# Patient Record
Sex: Female | Born: 1950 | Race: White | Hispanic: No | Smoking: Former smoker
Health system: Southern US, Community
[De-identification: ages and names within clinical notes are randomized; demographics above are authoritative.]

## PROBLEM LIST (undated history)

## (undated) DIAGNOSIS — Z8719 Personal history of other diseases of the digestive system: Secondary | ICD-10-CM

## (undated) DIAGNOSIS — J45909 Unspecified asthma, uncomplicated: Secondary | ICD-10-CM

## (undated) DIAGNOSIS — T8859XA Other complications of anesthesia, initial encounter: Secondary | ICD-10-CM

## (undated) DIAGNOSIS — I493 Ventricular premature depolarization: Secondary | ICD-10-CM

## (undated) DIAGNOSIS — Z9289 Personal history of other medical treatment: Secondary | ICD-10-CM

## (undated) DIAGNOSIS — H04129 Dry eye syndrome of unspecified lacrimal gland: Secondary | ICD-10-CM

## (undated) DIAGNOSIS — J302 Other seasonal allergic rhinitis: Secondary | ICD-10-CM

## (undated) DIAGNOSIS — R002 Palpitations: Secondary | ICD-10-CM

## (undated) DIAGNOSIS — E785 Hyperlipidemia, unspecified: Secondary | ICD-10-CM

## (undated) DIAGNOSIS — R519 Headache, unspecified: Secondary | ICD-10-CM

## (undated) DIAGNOSIS — R682 Dry mouth, unspecified: Secondary | ICD-10-CM

## (undated) DIAGNOSIS — N3010 Interstitial cystitis (chronic) without hematuria: Secondary | ICD-10-CM

## (undated) DIAGNOSIS — Z8614 Personal history of Methicillin resistant Staphylococcus aureus infection: Secondary | ICD-10-CM

## (undated) DIAGNOSIS — G4700 Insomnia, unspecified: Secondary | ICD-10-CM

## (undated) DIAGNOSIS — T4145XA Adverse effect of unspecified anesthetic, initial encounter: Secondary | ICD-10-CM

## (undated) DIAGNOSIS — K589 Irritable bowel syndrome without diarrhea: Secondary | ICD-10-CM

## (undated) DIAGNOSIS — M1990 Unspecified osteoarthritis, unspecified site: Secondary | ICD-10-CM

## (undated) DIAGNOSIS — R42 Dizziness and giddiness: Secondary | ICD-10-CM

## (undated) DIAGNOSIS — Z8619 Personal history of other infectious and parasitic diseases: Secondary | ICD-10-CM

## (undated) DIAGNOSIS — F329 Major depressive disorder, single episode, unspecified: Secondary | ICD-10-CM

## (undated) DIAGNOSIS — L439 Lichen planus, unspecified: Secondary | ICD-10-CM

## (undated) DIAGNOSIS — K219 Gastro-esophageal reflux disease without esophagitis: Secondary | ICD-10-CM

## (undated) DIAGNOSIS — Z8673 Personal history of transient ischemic attack (TIA), and cerebral infarction without residual deficits: Secondary | ICD-10-CM

## (undated) DIAGNOSIS — H269 Unspecified cataract: Secondary | ICD-10-CM

## (undated) DIAGNOSIS — I10 Essential (primary) hypertension: Secondary | ICD-10-CM

## (undated) DIAGNOSIS — D649 Anemia, unspecified: Secondary | ICD-10-CM

## (undated) DIAGNOSIS — Z86718 Personal history of other venous thrombosis and embolism: Secondary | ICD-10-CM

## (undated) DIAGNOSIS — N35919 Unspecified urethral stricture, male, unspecified site: Secondary | ICD-10-CM

## (undated) DIAGNOSIS — F32A Depression, unspecified: Secondary | ICD-10-CM

## (undated) DIAGNOSIS — G459 Transient cerebral ischemic attack, unspecified: Secondary | ICD-10-CM

## (undated) DIAGNOSIS — F40240 Claustrophobia: Secondary | ICD-10-CM

## (undated) DIAGNOSIS — R51 Headache: Secondary | ICD-10-CM

## (undated) DIAGNOSIS — J40 Bronchitis, not specified as acute or chronic: Secondary | ICD-10-CM

## (undated) HISTORY — DX: Essential (primary) hypertension: I10

## (undated) HISTORY — PX: ELBOW SURGERY: SHX618

## (undated) HISTORY — PX: TOOTH EXTRACTION: SUR596

## (undated) HISTORY — DX: Palpitations: R00.2

## (undated) HISTORY — PX: HERNIA REPAIR: SHX51

## (undated) HISTORY — DX: Personal history of other medical treatment: Z92.89

## (undated) HISTORY — DX: Gastro-esophageal reflux disease without esophagitis: K21.9

## (undated) HISTORY — PX: WISDOM TOOTH EXTRACTION: SHX21

## (undated) HISTORY — DX: Hyperlipidemia, unspecified: E78.5

---

## 2001-10-11 ENCOUNTER — Other Ambulatory Visit: Admission: RE | Admit: 2001-10-11 | Discharge: 2001-10-11 | Payer: Self-pay | Admitting: Obstetrics and Gynecology

## 2001-10-18 ENCOUNTER — Encounter: Payer: Self-pay | Admitting: Obstetrics and Gynecology

## 2001-10-18 ENCOUNTER — Ambulatory Visit (HOSPITAL_COMMUNITY): Admission: RE | Admit: 2001-10-18 | Discharge: 2001-10-18 | Payer: Self-pay | Admitting: Obstetrics and Gynecology

## 2001-10-27 ENCOUNTER — Other Ambulatory Visit: Admission: RE | Admit: 2001-10-27 | Discharge: 2001-10-27 | Payer: Self-pay | Admitting: Obstetrics and Gynecology

## 2001-11-10 ENCOUNTER — Encounter: Admission: RE | Admit: 2001-11-10 | Discharge: 2001-11-10 | Payer: Self-pay | Admitting: Obstetrics and Gynecology

## 2001-11-10 ENCOUNTER — Encounter: Payer: Self-pay | Admitting: Obstetrics and Gynecology

## 2002-09-27 ENCOUNTER — Ambulatory Visit (HOSPITAL_BASED_OUTPATIENT_CLINIC_OR_DEPARTMENT_OTHER): Admission: RE | Admit: 2002-09-27 | Discharge: 2002-09-27 | Payer: Self-pay | Admitting: Obstetrics and Gynecology

## 2002-09-27 HISTORY — PX: OTHER SURGICAL HISTORY: SHX169

## 2002-10-10 ENCOUNTER — Emergency Department (HOSPITAL_COMMUNITY): Admission: EM | Admit: 2002-10-10 | Discharge: 2002-10-10 | Payer: Self-pay | Admitting: Emergency Medicine

## 2002-10-10 ENCOUNTER — Encounter: Payer: Self-pay | Admitting: Emergency Medicine

## 2002-10-25 ENCOUNTER — Other Ambulatory Visit: Admission: RE | Admit: 2002-10-25 | Discharge: 2002-10-25 | Payer: Self-pay | Admitting: Obstetrics and Gynecology

## 2004-09-19 ENCOUNTER — Encounter: Admission: RE | Admit: 2004-09-19 | Discharge: 2004-09-19 | Payer: Self-pay | Admitting: Obstetrics and Gynecology

## 2005-08-28 ENCOUNTER — Emergency Department (HOSPITAL_COMMUNITY): Admission: EM | Admit: 2005-08-28 | Discharge: 2005-08-29 | Payer: Self-pay | Admitting: Emergency Medicine

## 2007-06-23 ENCOUNTER — Emergency Department (HOSPITAL_COMMUNITY): Admission: EM | Admit: 2007-06-23 | Discharge: 2007-06-24 | Payer: Self-pay | Admitting: Emergency Medicine

## 2007-11-17 ENCOUNTER — Emergency Department (HOSPITAL_BASED_OUTPATIENT_CLINIC_OR_DEPARTMENT_OTHER): Admission: EM | Admit: 2007-11-17 | Discharge: 2007-11-17 | Payer: Self-pay | Admitting: Emergency Medicine

## 2008-02-04 DIAGNOSIS — Z9289 Personal history of other medical treatment: Secondary | ICD-10-CM

## 2008-02-04 HISTORY — DX: Personal history of other medical treatment: Z92.89

## 2008-02-09 ENCOUNTER — Ambulatory Visit: Payer: Self-pay | Admitting: Radiology

## 2008-02-09 ENCOUNTER — Emergency Department (HOSPITAL_BASED_OUTPATIENT_CLINIC_OR_DEPARTMENT_OTHER): Admission: EM | Admit: 2008-02-09 | Discharge: 2008-02-09 | Payer: Self-pay | Admitting: Emergency Medicine

## 2008-06-02 ENCOUNTER — Ambulatory Visit: Payer: Self-pay | Admitting: Diagnostic Radiology

## 2008-06-02 ENCOUNTER — Observation Stay (HOSPITAL_COMMUNITY): Admission: AD | Admit: 2008-06-02 | Discharge: 2008-06-03 | Payer: Self-pay | Admitting: Internal Medicine

## 2008-06-02 ENCOUNTER — Encounter: Payer: Self-pay | Admitting: Emergency Medicine

## 2009-04-19 ENCOUNTER — Emergency Department (HOSPITAL_BASED_OUTPATIENT_CLINIC_OR_DEPARTMENT_OTHER): Admission: EM | Admit: 2009-04-19 | Discharge: 2009-04-20 | Payer: Self-pay | Admitting: Emergency Medicine

## 2010-01-01 ENCOUNTER — Ambulatory Visit: Payer: Self-pay | Admitting: Cardiology

## 2010-05-14 LAB — CARDIAC PANEL(CRET KIN+CKTOT+MB+TROPI)
CK, MB: 1 ng/mL (ref 0.3–4.0)
Relative Index: INVALID (ref 0.0–2.5)
Total CK: 41 U/L (ref 7–177)
Troponin I: 0.01 ng/mL (ref 0.00–0.06)

## 2010-05-14 LAB — LIPID PANEL
Cholesterol: 198 mg/dL (ref 0–200)
HDL: 50 mg/dL (ref >39)
LDL Cholesterol: 126 mg/dL — ABNORMAL HIGH (ref 0–99)
Total CHOL/HDL Ratio: 4 RATIO
Triglycerides: 112 mg/dL (ref <150)
VLDL: 22 mg/dL (ref 0–40)

## 2010-05-15 LAB — CARDIAC PANEL(CRET KIN+CKTOT+MB+TROPI)
CK, MB: 0.9 ng/mL (ref 0.3–4.0)
CK, MB: 1 ng/mL (ref 0.3–4.0)
Relative Index: INVALID (ref 0.0–2.5)
Relative Index: INVALID (ref 0.0–2.5)
Total CK: 42 U/L (ref 7–177)
Total CK: 55 U/L (ref 7–177)
Troponin I: 0.01 ng/mL (ref 0.00–0.06)
Troponin I: 0.02 ng/mL (ref 0.00–0.06)

## 2010-05-15 LAB — TSH: TSH: 0.428 u[IU]/mL (ref 0.350–4.500)

## 2010-05-15 LAB — BASIC METABOLIC PANEL
BUN: 23 mg/dL (ref 6–23)
CO2: 29 mEq/L (ref 19–32)
Calcium: 9.6 mg/dL (ref 8.4–10.5)
Chloride: 103 mEq/L (ref 96–112)
Creatinine, Ser: 0.8 mg/dL (ref 0.4–1.2)
GFR calc Af Amer: >60 mL/min (ref >60)
GFR calc non Af Amer: >60 mL/min (ref >60)
Glucose, Bld: 114 mg/dL — ABNORMAL HIGH (ref 70–99)
Potassium: 5 mEq/L (ref 3.5–5.1)
Sodium: 140 mEq/L (ref 135–145)

## 2010-05-15 LAB — CBC
HCT: 42.6 % (ref 36.0–46.0)
Hemoglobin: 14.4 g/dL (ref 12.0–15.0)
MCHC: 33.8 g/dL (ref 30.0–36.0)
MCV: 92.4 fL (ref 78.0–100.0)
Platelets: 209 10*3/uL (ref 150–400)
RBC: 4.61 MIL/uL (ref 3.87–5.11)
RDW: 11.7 % (ref 11.5–15.5)
WBC: 11.9 10*3/uL — ABNORMAL HIGH (ref 4.0–10.5)

## 2010-05-15 LAB — DIFFERENTIAL
Basophils Absolute: 0.1 10*3/uL (ref 0.0–0.1)
Basophils Relative: 1 % (ref 0–1)
Eosinophils Absolute: 0.5 10*3/uL (ref 0.0–0.7)
Eosinophils Relative: 4 % (ref 0–5)
Lymphocytes Relative: 7 % — ABNORMAL LOW (ref 12–46)
Lymphs Abs: 0.8 10*3/uL (ref 0.7–4.0)
Monocytes Absolute: 1 10*3/uL (ref 0.1–1.0)
Monocytes Relative: 9 % (ref 3–12)
Neutro Abs: 9.5 10*3/uL — ABNORMAL HIGH (ref 1.7–7.7)
Neutrophils Relative %: 79 % — ABNORMAL HIGH (ref 43–77)

## 2010-05-15 LAB — POCT CARDIAC MARKERS
CKMB, poc: <1 ng/mL — ABNORMAL LOW (ref 1.0–8.0)
Myoglobin, poc: 77.2 ng/mL (ref 12–200)
Troponin i, poc: <0.05 ng/mL (ref 0.00–0.09)

## 2010-05-15 LAB — LIPID PANEL
Cholesterol: 216 mg/dL — ABNORMAL HIGH (ref 0–200)
HDL: 59 mg/dL (ref >39)
LDL Cholesterol: 135 mg/dL — ABNORMAL HIGH (ref 0–99)
Total CHOL/HDL Ratio: 3.7 RATIO
Triglycerides: 110 mg/dL (ref <150)
VLDL: 22 mg/dL (ref 0–40)

## 2010-05-20 LAB — D-DIMER, QUANTITATIVE: D-Dimer, Quant: 0.24 ug/mL-FEU (ref 0.00–0.48)

## 2010-05-20 LAB — POCT CARDIAC MARKERS
CKMB, poc: <1 ng/mL — ABNORMAL LOW (ref 1.0–8.0)
Myoglobin, poc: 30.3 ng/mL (ref 12–200)
Troponin i, poc: <0.05 ng/mL (ref 0.00–0.09)

## 2010-06-18 NOTE — Discharge Summary (Signed)
NAMESHAM, ALVIAR           ACCOUNT NO.:  [id number]   MEDICAL RECORD NO.:  [id number]          PATIENT TYPE:  INP   LOCATION:  4712                         FACILITY:  [facility]   PHYSICIAN:  Michelene Gardener, MD    DATE OF BIRTH:  Feb 06, 1950   DATE OF ADMISSION:  06/02/2008  DATE OF DISCHARGE:  06/03/2008                               DISCHARGE SUMMARY   DISCHARGE DIAGNOSES:  1. Noncardiac chest pain.  2. Hypertension.  3. Hyperlipidemia which is diet controlled.  4. Depression.   DISCHARGE MEDICATIONS:  1. Accupril 40 mg p.o. once daily.  2. Toprol-XL 50 mg once a day.  3. Hydrochlorothiazide 25 mg once a day.  4. Wellbutrin XL 300 mg p.o. once daily.   CONSULTATIONS:  None.   PROCEDURES:  None.   DIAGNOSTIC STUDIES:  1. Chest x-ray on June 02, 2008, showed no active disease.  2. CT angiography on June 02, 2008, showed no acute abnormalities.   FOLLOWUP:  With primary doctor within a week.   HOSPITAL COURSE:  This is a 60 year old female with past medical history  of hypertension and diet-controlled hyperlipidemia, presented to the  hospital complaining of chest pain.  Her pain was very atypical and it  has been consistent with her known arthritis.  The patient used to  experience same kind of pain with her known arthritis.  Came in at this  time because her pain lasted longer.  She only had 1 episode with no  recurrence during this hospitalization.  She was monitored on telemetry  which showed no evidence of arrhythmias.  Her EKG showed no evidence of  acute ischemia.  Three sets of troponin and cardiac enzymes were done  and they came to be normal.  Her chest x-ray and her CT angiography are  negative.  I have a prolonged discussion with herself and her family at  the time of discharge and I advised them to stay in the hospital for  echocardiogram versus stress test.  The patient and family preferred to  go home since this is the weekend and she has to stay at  least for 2  days before this test has been done.  I myself I feel that it is  reasonable since she is asymptomatic for the time being and her total  workup has been negative.  I advised her to follow up with her primary  doctor next week for possible echocardiogram versus  stress test and to come to the ER if she develops any chest pain or  increasing shortness of breath.  She will be discharged home in  preadmission medications.   Total assessment time is 40 minutes.      Michelene Gardener, MD  Electronically Signed     NAE/MEDQ  D:  06/03/2008  T:  06/04/2008  Job:  [id number]

## 2010-06-18 NOTE — H&P (Signed)
NAMEGEENA, WEINHOLD           ACCOUNT NO.:  [id number]   MEDICAL RECORD NO.:  [id number]          PATIENT TYPE:  INP   LOCATION:  4712                         FACILITY:  [facility]   PHYSICIAN:  Charlestine Massed, MDDATE OF BIRTH:  02/07/1950   DATE OF ADMISSION:  06/02/2008  DATE OF DISCHARGE:                              HISTORY & PHYSICAL   PRIMARY CARE PHYSICIAN:  Dr. Benedetto Goad and Dr. Doristine Counter at [facility].   CHIEF COMPLAINT:  The patient was transferred from [facility]  to [facility] to rule out acute myocardial infarction as per  the physician at [facility].   HISTORY OF PRESENT ILLNESS:  Ms. Khamia Stambaugh is a 60 year old  female who works as a Runner, broadcasting/film/video at [employer].  Was  transferred from [facility] for a complaint of chest pain.  The  pain started in the morning today.  She had the pain yesterday evening  also all the time.  It was in the retrosternal area with no specific  pointing area.  It is graded from 5-6/10.  The pain is more of a vague  pain as per the patient.  It is not a pressure, not a squeezing type.  It is not a tightness.  It is not burning and is not a sharp pain.  She  describes it as a vague pain.  Se has had this pain before and she was  told that it is more musculoskeletal.  She has chronic neck pain as well  as bilateral shoulder pain, for which she has been getting treatment for  degenerative joint disease as well as possible radiculopathy which is  present for years.  She said this pain started in the evening and when  she went to bed the pain was still there and she slept.  In the morning  she woke up with her regular alarm.  The pain did not wake her up from  sleep.  When she woke up the pain was still there and it continued and  it was not going away as usually it does, and so she went to [facility] even though she lives in [place].  She was evaluated by the  physician there.  A CAT scan of the chest was done to rule out pulmonary  embolism, which is negative.  Initial troponins and EKGs did not reveal  any evidence of acute ischemia going on.  The patient was transferred  from [facility] to here for further telemetry and to rule out  MI.   The patient said she had similar pain a few years back but nothing in  the recent past.  She had a workup with echocardiogram a long time back.  She was also told by her primary doctor that she has acid reflux  disease.   The pain is present in the retrosternal area.  It is a vague kind of  pain as explained earlier.  There is no radiation of the pain.  Her neck  and shoulder pains are chronic, which were there even before this pain  started.  There is no radiation of the pain to the back.  There in an  increase in the pain with deep inspiration with induced coughing, but  she does not have a regular cough and there is no relieving factor for  the pain.  When she came to the ER, they give her morphine and the pain  was relieved.  She denies any fever or chills.  A few days ago she had  some nasal stuffiness and congestion but there was no cough at any time  and that she does not have a cough with sputum production now.  No  nausea or vomiting or diarrhea.  No headache.  She has very good  exercise tolerance.  She can walk a considerable distance and she can go  up the stairs at home without any shortness of breath.  There has been  no limitation in her physical activity at any time due to any symptoms  such as shortness of breath or chest pain or dizziness.  She never had  any loss of consciousness at any time.  No palpitations.  No fevers.  No  urinary symptoms.  No abdominal pain.   Her past history is significant for:  1. Hypertension.  2. Acid reflux.  3. Chronic neck and shoulder pain due to her neck issues as per the      patient.   CURRENT MEDICATIONS:  1. Accupril 40 mg p.o.  daily.  2. Toprol XL 50 mg p.o. daily.  3. Hydrochlorothiazide 25 mg p.o. daily.  4. Wellbutrin XL 300 mg p.o. daily.   ALLERGIES:  NO KNOWN DRUG ALLERGIES.  NO LATEX ALLERGY.   SOCIAL HISTORY:  Never smoked.  Very occasional social alcohol intake.  Denies any drug use.  No chronic pain medication use.   FAMILY HISTORY:  Mom had hypertension.  No history of diabetes or acute  myocardial infarction or heart disease or stroke in any family members  including her siblings, her mom, dad, or in the next related family  members.   REVIEW OF SYSTEMS:  CONSTITUTIONAL:  No fever, no weakness, no fatigue  and no other issues.  HEAD AND NECK:  Neck pain, shoulder pain, chronic.  RESPIRATORY AND CARDIAC:  Chest pain, retrosternal, increasing with  inspiration.  No cough, no fever, no sputum production.  GI AND GU:  History of acid reflux disease.  No abdominal pain.  No nausea,  vomiting, diarrhea.  No urinary symptoms.  She is postmenopausal.  MUSCULOSKELETAL:  Pain in the chest could be possibly musculoskeletal.  No other body pain.  CNS:  No neurological issues and possible  radiculopathy in the neck.  PSYCH:  Stable mental status.   A 12-point review of all systems was done.  Positive pertinent features  as mentioned in the history of present illness and as above and is  negative otherwise.   VITAL SIGNS:  Vital signs done at the office, blood pressure 119/74,  heart rate 76, respiration 18, temperature 98.1 and O2 sat 99% on room  air.  GENERAL EXAMINATION:  The patient is awake and alert, not in any  distress.  Afebrile.  HEAD AND NECK EXAM:  Pupils reactive to light.  No JVD.  No bleeding  seen of the oral or nasal mucosa.  NECK:  No JVD.  No bruit.  No nodes.  No tender spots in the neck.  Turning of the neck does not elicit any pain.  CHEST:  The retrosternal area, no eliciting, the chest  pain could not be  reproduced by gentle pressure on the chest.  There are no tender areas,   no inflammation seen.  Bilateral air entry is good anteriorly and  posteriorly.  No pleural rubs or rales or wheeze heard.  CARDIAC:  S1, S2 heard, regular.  There is a systolic murmur in the  aortic area.  No rubs heard.  ABDOMEN:  Soft, nontender.  No organomegaly.  Bowel sounds positive with  no costovertebral angle tenderness.  EXTREMITIES:  Trace bilateral pedal edema present.  CNS: No obvious motor or sensory deficits.  Comprehension and speech are  intact.  Alert and oriented x3.  PSYCH:  Stable mental status.  MUSCULOSKELETAL:  Normal range of motion in all joints.  No effusions  and no other issues present.   LABS:  EKG done at [facility] at 6:10 a.m. shows a normal sinus rhythm  at 78 beats per minute.  There is a leftward axis, no acute ST or T-wave  changes or ischemia seen.  Questionable left atrial enlargement but  still not typical.   A chest x-ray done at [facility] shows no active cardiopulmonary  disease.  Cardiac silhouette is within normal limits.   CT angiography for PE protocol done at [facility] also is negative for  pulmonary embolism.  No aortic dissection or aneurysm.  Lungs are clear  without infiltrate or effusion.  No mass.  There is an 11-cm precarinal  lymph node with a fatty hilum which appears benign.  Hepatic cyst  present.  No acute abnormalities.  On summary, CT was negative for PE.  BMP:  Sodium 140, potassium 5.0, chloride 103, bicarb 29, glucose 114,  BUN 23, creatinine 0.8 and calcium 9.6.   CBC:  WBC 7.9, hemoglobin 14.4, hematocrit 42.6, platelet count 209,  neutrophils 79%.   Cardiac markers:  Troponin less than 0.05 x1 set done at 6:42 a.m.   ASSESSMENT:  1. Chest pain mostly noncardiac origin, to rule out noncardiac      origin/not secondary to ischemic heart disease but to rule out      pericarditis as a possibility versus bronchitis versus      musculoskeletal pain.  2. Hypertension.  Blood pressure is controlled.   PLAN:  First,  cardiac wise, will start the patient on aspirin as the  chest pain is more noncardiac in origin.  Still, we will do cyclic  enzymes and will check a fasting lipid panel in view of the fact that  she her risk for heart disease is high at this age.  Will check a 2-D  echocardiogram to rule out a pericardial effusion/pericarditis.  Put the  patient on naproxen 250 mg p.o. b.i.d. for her pain.  Will give Protonix  at 40 mg p.o. b.i.d. for acid reflux disease at this time.  Will  continue aspirin.  Will follow the results of the echo for pericarditis.  Currently there is no evidence of any effusion or cardiac tamponade at  this moment.  Can continue a 2-gram sodium heart-healthy diet and  activity is a tolerated.  Vital signs as per floor protocol.  Will also  check TSH in the a.m.  Will continue the Toprol XL and Accupril and  hydrochlorothiazide as before.   Prior history of Wellbutrin intake, which is not clear for the exact  reason why she was prescribed Wellbutrin by the PMD.  Will continue the  Wellbutrin for now as there is no contraindication at this time.  DVT prophylaxis.  Will give heparin.   DISPOSITION:  Once a 2-D echo rules out any presence of effusion and if  enzymes are negative, the patient can be discharged with further  followup as an outpatient with her PMD and with GI referral from by the  PMD for further evaluation of acid reflux disease.   A total of 45 minutes was spent on this admission.      Charlestine Massed, MD  Electronically Signed     UT/MEDQ  D:  06/02/2008  T:  06/02/2008  Job:  [id number]   cc:   Gloriajean Dell. Andrey Campanile, M.D.

## 2010-06-21 NOTE — Op Note (Signed)
   NAME:  Cynthia Peters, Cynthia Peters                     ACCOUNT NO.:  [id number]   MEDICAL RECORD NO.:  [id number]                   PATIENT TYPE:  AMB   LOCATION:  [facility]                                 FACILITY:  [facility]   PHYSICIAN:  Cynthia P. Romine, M.D.             DATE OF BIRTH:  January 14, 1951   DATE OF PROCEDURE:  09/27/2002  DATE OF DISCHARGE:                                 OPERATIVE REPORT   PREOPERATIVE DIAGNOSIS:  Menorrhagia unresponsive to medical management.   POSTOPERATIVE DIAGNOSIS:  Menorrhagia unresponsive to medical management.   PROCEDURE:  Endometrial ablation using HydroThermAblator.   SURGEON:  Cynthia P. Romine, M.D.   ANESTHESIA:  General by LMA.   ESTIMATED BLOOD LOSS:  Minimal.   COMPLICATIONS:  None.   DESCRIPTION OF PROCEDURE:  The patient was taken to the operating room and  after the induction of adequate general anesthesia, was placed in the dorsal  lithotomy position and prepped and draped in the usual fashion.  The bladder  was drained with a red rubber catheter.  The cervix was grasped at the  anterior lip with a single-toothed tenaculum; the uterus sounded to 8 cm.  The cervix was dilated to #21 Shawnie Pons.  The ablating hysteroscope was then  introduced.  Photographic documentation was taken of the tubal ostia to  document proper placement.  Endometrial ablation was then carried out in the  standard fashion.  There were no complications.  Photographic documentation  was taken of the postoperative effect.  The procedure was terminated.  The  instruments were removed from the vagina and the patient was taken to the  recovery room in satisfactory condition.                                               Cynthia P. Romine, M.D.    CPR/MEDQ  D:  09/27/2002  T:  09/27/2002  Job:  [id number]

## 2010-08-22 ENCOUNTER — Encounter: Payer: Self-pay | Admitting: Cardiology

## 2010-08-23 ENCOUNTER — Ambulatory Visit (INDEPENDENT_AMBULATORY_CARE_PROVIDER_SITE_OTHER): Payer: BC Managed Care – PPO | Admitting: Cardiology

## 2010-08-23 ENCOUNTER — Encounter: Payer: Self-pay | Admitting: Cardiology

## 2010-08-23 DIAGNOSIS — I493 Ventricular premature depolarization: Secondary | ICD-10-CM

## 2010-08-23 DIAGNOSIS — E785 Hyperlipidemia, unspecified: Secondary | ICD-10-CM

## 2010-08-23 DIAGNOSIS — I4949 Other premature depolarization: Secondary | ICD-10-CM

## 2010-08-23 DIAGNOSIS — R002 Palpitations: Secondary | ICD-10-CM

## 2010-08-23 DIAGNOSIS — I10 Essential (primary) hypertension: Secondary | ICD-10-CM

## 2010-08-23 NOTE — Assessment & Plan Note (Signed)
Well-controlled on metoprolol and Accupril.

## 2010-08-23 NOTE — Patient Instructions (Signed)
You may take one half of metoprolol if needed for palpitations.  I encourage regular aerobic exercise.

## 2010-08-23 NOTE — Assessment & Plan Note (Signed)
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 These are benign based on her prior cardiac evaluation. She has no structural heart disease. She is on appropriate blocker therapy. I recommended that she may take an extra half a dose of metoprolol if needed for increased symptoms but I would continue on her current therapy. I reassured her that this is benign. I will see her back as needed.

## 2010-08-23 NOTE — Progress Notes (Signed)
   Cynthia Peters Date of Birth: [birth year]   History of Present Illness: Cynthia Peters is seen for evaluation of palpitations. She reports that 2 days ago she was trying to go to sleep when she felt palpitations. She usually feels or hears this in her right ear. She notices a skip and a pound. When she awoke the next morning she was having similar symptoms. She had recurrent symptoms the next day as well. She has had no dizziness or syncope. She denies any shortness of breath or chest pain. She has had previous evaluation here including an event monitor in 2007. This showed rare PVCs. She also had a stress echo in 2010 which was normal. She has been on chronic beta blocker therapy. She denies caffeine or decongestant use. She does not drink alcohol.  Current Outpatient Prescriptions on File Prior to Visit  Medication Sig Dispense Refill  . albuterol (VENTOLIN HFA) 108 (90 BASE) MCG/ACT inhaler Inhale 2 puffs into the lungs every 6 (six) hours as needed.        Marland Kitchen buPROPion (WELLBUTRIN XL) 300 MG 24 hr tablet Take 300 mg by mouth daily.        . hydrochlorothiazide 25 MG tablet Take 25 mg by mouth daily.        . metoprolol (TOPROL-XL) 50 MG 24 hr tablet Take 50 mg by mouth daily.        Marland Kitchen omeprazole (PRILOSEC OTC) 20 MG tablet Take 20 mg by mouth daily.        . Probiotic Product (ALIGN PO) Take by mouth daily.        . quinapril (ACCUPRIL) 40 MG tablet Take 40 mg by mouth at bedtime.        . simvastatin (ZOCOR) 20 MG tablet Take 20 mg by mouth at bedtime.          No Known Allergies  Past Medical History  Diagnosis Date  . Irregular heart beat   . Palpitations   . GERD (gastroesophageal reflux disease)   . Hyperlipidemia   . Hypertension   . TMJ arthralgia   . Bronchitis, acute   . Cystitis   . Joint pain   . PVC's (premature ventricular contractions)     Past Surgical History  Procedure Date  . Dilation and curettage of uterus   . Endometrial ablation     History    Smoking status  . Never Smoker   Smokeless tobacco  . Not on file    History  Alcohol Use No    Family History  Problem Relation Age of Onset  . Dementia Mother   . Leukemia Child     Review of Systems: The review of systems is positive for symptoms of irritable bowel syndrome.  All other systems were reviewed and are negative.  Physical Exam: BP 120/80  Pulse 53  Ht 5\' 4"  (1.626 m)  Wt 158 lb 12.8 oz (72.031 kg)  BMI 27.26 kg/m2 She is a pleasant white female in no acute distress. Her HEENT exam is unremarkable. Has no JVD, adenopathy, thyromegaly, or bruits. Lungs are clear. Cardiac exam reveals a regular rate and rhythm without gallop, murmur, or click. Abdomen soft nontender. She has no edema. Pedal pulses are good. She is alert and oriented x3. Cranial nerves II through XII are intact. LABORATORY DATA: ECG demonstrates sinus bradycardia with a rate of 53 beats per minute with left axis deviation. It is otherwise normal.  Assessment / Plan:

## 2010-08-26 ENCOUNTER — Encounter: Payer: Self-pay | Admitting: Cardiology

## 2010-10-18 ENCOUNTER — Ambulatory Visit: Payer: Self-pay | Admitting: Cardiology

## 2010-10-30 LAB — DIFFERENTIAL
Basophils Absolute: 0.1
Basophils Relative: 1
Eosinophils Absolute: 0.5
Eosinophils Relative: 6 — ABNORMAL HIGH
Lymphocytes Relative: 18
Lymphs Abs: 1.6
Monocytes Absolute: 0.8
Monocytes Relative: 9
Neutro Abs: 5.7
Neutrophils Relative %: 66

## 2010-10-30 LAB — LIPASE, BLOOD: Lipase: 24

## 2010-10-30 LAB — URINE MICROSCOPIC-ADD ON

## 2010-10-30 LAB — URINALYSIS, ROUTINE W REFLEX MICROSCOPIC
Bilirubin Urine: NEGATIVE
Glucose, UA: NEGATIVE
Hgb urine dipstick: NEGATIVE
Ketones, ur: NEGATIVE
Nitrite: NEGATIVE
Protein, ur: NEGATIVE
Specific Gravity, Urine: 1.008
Urobilinogen, UA: 0.2
pH: 6.5

## 2010-10-30 LAB — CBC
HCT: 39.4
Hemoglobin: 13.6
MCHC: 34.4
MCV: 92.6
Platelets: 223
RBC: 4.26
RDW: 12.6
WBC: 8.6

## 2010-10-30 LAB — COMPREHENSIVE METABOLIC PANEL
ALT: 31
AST: 25
Albumin: 4.2
Alkaline Phosphatase: 59
BUN: 16
CO2: 28
Calcium: 9.4
Chloride: 104
Creatinine, Ser: 0.88
GFR calc Af Amer: >60
GFR calc non Af Amer: >60
Glucose, Bld: 106 — ABNORMAL HIGH
Potassium: 3.4 — ABNORMAL LOW
Sodium: 141
Total Bilirubin: 1.4 — ABNORMAL HIGH
Total Protein: 6.2

## 2011-01-03 ENCOUNTER — Other Ambulatory Visit (HOSPITAL_COMMUNITY): Payer: Self-pay | Admitting: Gastroenterology

## 2011-01-03 ENCOUNTER — Ambulatory Visit (HOSPITAL_COMMUNITY)
Admission: RE | Admit: 2011-01-03 | Discharge: 2011-01-03 | Disposition: A | Discharge status: Home / Self Care | Payer: BC Managed Care – PPO | Source: Ambulatory Visit | Attending: Gastroenterology | Admitting: Gastroenterology

## 2011-01-03 DIAGNOSIS — R109 Unspecified abdominal pain: Secondary | ICD-10-CM

## 2011-01-03 DIAGNOSIS — K429 Umbilical hernia without obstruction or gangrene: Secondary | ICD-10-CM | POA: Insufficient documentation

## 2011-01-03 DIAGNOSIS — K7689 Other specified diseases of liver: Secondary | ICD-10-CM | POA: Insufficient documentation

## 2011-01-03 MED ORDER — IOHEXOL 300 MG/ML  SOLN
100.0000 mL | Freq: Once | INTRAMUSCULAR | Status: AC | PRN
  Administered 2011-01-03: 100 mL via INTRAVENOUS

## 2011-01-29 ENCOUNTER — Encounter (INDEPENDENT_AMBULATORY_CARE_PROVIDER_SITE_OTHER): Payer: Self-pay | Admitting: Surgery

## 2011-01-31 ENCOUNTER — Ambulatory Visit (INDEPENDENT_AMBULATORY_CARE_PROVIDER_SITE_OTHER): Payer: BC Managed Care – PPO | Admitting: Surgery

## 2011-02-04 DIAGNOSIS — Z9289 Personal history of other medical treatment: Secondary | ICD-10-CM

## 2011-02-04 DIAGNOSIS — Z8673 Personal history of transient ischemic attack (TIA), and cerebral infarction without residual deficits: Secondary | ICD-10-CM

## 2011-02-04 HISTORY — DX: Personal history of other medical treatment: Z92.89

## 2011-02-04 HISTORY — DX: Personal history of transient ischemic attack (TIA), and cerebral infarction without residual deficits: Z86.73

## 2011-02-04 HISTORY — PX: UMBILICAL HERNIA REPAIR: SHX196

## 2011-02-09 ENCOUNTER — Encounter (HOSPITAL_BASED_OUTPATIENT_CLINIC_OR_DEPARTMENT_OTHER): Payer: Self-pay | Admitting: *Deleted

## 2011-02-09 ENCOUNTER — Emergency Department (HOSPITAL_BASED_OUTPATIENT_CLINIC_OR_DEPARTMENT_OTHER)
Admission: EM | Admit: 2011-02-09 | Discharge: 2011-02-09 | Disposition: A | Discharge status: Home / Self Care | Payer: BC Managed Care – PPO | Attending: Emergency Medicine | Admitting: Emergency Medicine

## 2011-02-09 DIAGNOSIS — R21 Rash and other nonspecific skin eruption: Secondary | ICD-10-CM | POA: Insufficient documentation

## 2011-02-09 DIAGNOSIS — Z79899 Other long term (current) drug therapy: Secondary | ICD-10-CM | POA: Insufficient documentation

## 2011-02-09 DIAGNOSIS — E785 Hyperlipidemia, unspecified: Secondary | ICD-10-CM | POA: Insufficient documentation

## 2011-02-09 DIAGNOSIS — K219 Gastro-esophageal reflux disease without esophagitis: Secondary | ICD-10-CM | POA: Insufficient documentation

## 2011-02-09 DIAGNOSIS — T7840XA Allergy, unspecified, initial encounter: Secondary | ICD-10-CM

## 2011-02-09 DIAGNOSIS — I10 Essential (primary) hypertension: Secondary | ICD-10-CM | POA: Insufficient documentation

## 2011-02-09 DIAGNOSIS — J40 Bronchitis, not specified as acute or chronic: Secondary | ICD-10-CM

## 2011-02-09 MED ORDER — FAMOTIDINE 20 MG PO TABS
40.0000 mg | ORAL_TABLET | Freq: Once | ORAL | Status: AC
  Administered 2011-02-09: 40 mg via ORAL
  Filled 2011-02-09: qty 2

## 2011-02-09 MED ORDER — PREDNISONE 50 MG PO TABS: 50.0000 mg | ORAL_TABLET | Freq: Every day | ORAL | Status: DC

## 2011-02-09 MED ORDER — FAMOTIDINE 20 MG PO TABS: 20.0000 mg | ORAL_TABLET | Freq: Two times a day (BID) | ORAL | Status: DC

## 2011-02-09 MED ORDER — DEXAMETHASONE SODIUM PHOSPHATE 4 MG/ML IJ SOLN
8.0000 mg | Freq: Once | INTRAMUSCULAR | Status: AC
  Administered 2011-02-09: 8 mg via INTRAMUSCULAR
  Filled 2011-02-09: qty 2

## 2011-02-09 NOTE — ED Notes (Signed)
Pt was seen here last night for a possible reaction to meds pt had redness to face and flushing pt returns tonight with a feeling of difficulty breathing VS stable no distress noted

## 2011-02-09 NOTE — ED Notes (Signed)
Pt had surgery day before yesterday. Seen here today for possible reaction to something she was given. Given shot, but later developed hoarseness and hard to take a deep breath. Hx bronchitis. No distress.

## 2011-02-09 NOTE — ED Provider Notes (Signed)
History     CSN: [id number]  Arrival date & time 02/09/11  1904   First MD Initiated Contact with Patient 02/09/11 2000      Chief Complaint  Patient presents with  . Medication Reaction    (Consider location/radiation/quality/duration/timing/severity/associated sxs/prior treatment) HPI Comments: Pt states that she had hernia surgery 2 days ago :pt state that today she feels like she is having some bronchitis:pt states that she was seen yesterday for an allergic reaction to something and she was wondering if this was related:pt state that her inhaler helps the symptoms:pt states that she also woke up hoarse this morning  Patient is a 61 y.o. female presenting with cough. The history is provided by the patient. No language interpreter was used.  Cough This is a new problem. The current episode started 12 to 24 hours ago. The problem occurs constantly. The problem has not changed since onset.The cough is non-productive. There has been no fever. Treatments tried: albuterol. The treatment provided moderate relief. Her past medical history is significant for bronchitis.    Past Medical History  Diagnosis Date  . Irregular heart beat   . Palpitations   . GERD (gastroesophageal reflux disease)   . Hyperlipidemia   . Hypertension   . TMJ arthralgia   . Bronchitis, acute   . Cystitis   . Joint pain   . PVC's (premature ventricular contractions)     Past Surgical History  Procedure Date  . Dilation and curettage of uterus   . Endometrial ablation   . Hernia repair     Family History  Problem Relation Age of Onset  . Dementia Mother   . Leukemia Child     History  Substance Use Topics  . Smoking status: Never Smoker   . Smokeless tobacco: Not on file  . Alcohol Use: No    OB History    Grav Para Term Preterm Abortions TAB SAB Ect Mult Living                  Review of Systems  Respiratory: Positive for cough.   All other systems reviewed and are  negative.    Allergies  Review of patient's allergies indicates no known allergies.  Home Medications   Current Outpatient Rx  Name Route Sig Dispense Refill  . ACETAMINOPHEN 500 MG PO TABS Oral Take 1,000 mg by mouth every 6 (six) hours as needed. For pain     . ALBUTEROL SULFATE HFA 108 (90 BASE) MCG/ACT IN AERS Inhalation Inhale 2 puffs into the lungs every 6 (six) hours as needed. For shortness of breath and wheezing    . BUPROPION HCL ER (XL) 300 MG PO TB24 Oral Take 300 mg by mouth daily.      Marland Kitchen CALCIUM CARBONATE-VITAMIN D 600-400 MG-UNIT PO TABS Oral Take 1 tablet by mouth daily.      Marland Kitchen DICYCLOMINE HCL 10 MG PO CAPS Oral Take 10 mg by mouth 4 (four) times daily -  before meals and at bedtime. For IBS     . ETODOLAC 500 MG PO TABS Oral Take 500 mg by mouth daily as needed. For pain    . EVENING PRIMROSE OIL 1000 MG PO CAPS Oral Take 2 capsules by mouth daily.      Marland Kitchen FLAXSEED (LINSEED) 1000 MG PO CAPS Oral Take 2 capsules by mouth daily.      Marland Kitchen HYDROCHLOROTHIAZIDE 25 MG PO TABS Oral Take 25 mg by mouth daily.      Marland Kitchen  LOSARTAN POTASSIUM 50 MG PO TABS Oral Take 50 mg by mouth daily.      Marland Kitchen METOPROLOL SUCCINATE ER 50 MG PO TB24 Oral Take 50 mg by mouth daily.      . ADULT MULTIVITAMIN W/MINERALS CH Oral Take 1 tablet by mouth daily.      Marland Kitchen OMEPRAZOLE MAGNESIUM 20 MG PO TBEC Oral Take 20 mg by mouth daily.      Marland Kitchen ALIGN PO Oral Take 1 capsule by mouth daily.     Marland Kitchen SIMVASTATIN 20 MG PO TABS Oral Take 20 mg by mouth daily.     Marland Kitchen FAMOTIDINE 20 MG PO TABS Oral Take 1 tablet (20 mg total) by mouth 2 (two) times daily. 10 tablet 0  . PREDNISONE 50 MG PO TABS Oral Take 1 tablet (50 mg total) by mouth daily. 5 tablet 0    BP 143/78  Pulse 69  Temp(Src) 98.1 F (36.7 C) (Oral)  Resp 17  Ht 5\' 4"  (1.626 m)  Wt 160 lb (72.576 kg)  BMI 27.46 kg/m2  SpO2 100%  Physical Exam  Nursing note and vitals reviewed. Constitutional: She is oriented to person, place, and time. She appears  well-developed and well-nourished.  HENT:  Head: Normocephalic and atraumatic.  Right Ear: External ear normal.  Left Ear: External ear normal.  Mouth/Throat: Oropharynx is clear and moist.  Cardiovascular: Normal rate and regular rhythm.   Pulmonary/Chest: Effort normal and breath sounds normal.  Musculoskeletal: Normal range of motion.  Neurological: She is alert and oriented to person, place, and time.  Skin: Skin is warm and dry.  Psychiatric: She has a normal mood and affect.    ED Course  Procedures (including critical care time)  Labs Reviewed - No data to display No results found.   1. Bronchitis       MDM  Possibly a bronchitis:pt has inhaler at home and was given steroids yesterday:pt in no acute distress at this time        Teressa Lower, NP 02/09/11 2029

## 2011-02-09 NOTE — ED Notes (Signed)
Pt states she had hernia surgery yesterday and this a.m. Eyes were watery and face felt hot and flushed. Taking Hydrocodone for pain. Called Dr. Catalina Pizza to take Benadryl 50 mg. Stop Hydrocodone (which she did) Face still feels flushed. No itching or SHOB.

## 2011-02-09 NOTE — ED Provider Notes (Signed)
History     CSN: [id number]  Arrival date & time 02/09/11  0051   First MD Initiated Contact with Patient 02/09/11 0142      Chief Complaint  Patient presents with  . Allergic Reaction    (Consider location/radiation/quality/duration/timing/severity/associated sxs/prior treatment) Patient is a 61 y.o. female presenting with allergic reaction. The history is provided by the patient. No language interpreter was used.  Allergic Reaction The primary symptoms are  rash. The primary symptoms do not include wheezing, shortness of breath, cough, abdominal pain, nausea, vomiting, diarrhea, dizziness, palpitations, altered mental status, angioedema or urticaria. The current episode started 3 to 5 hours ago. The problem has not changed since onset.This is a new problem.  The rash began yesterday. The rash appears on the face. The pain associated with the rash is moderate. The rash is associated with itching. The rash is not associated with blisters or weeping. Risk factors for rash include new medications.  The onset of the reaction was associated with a new medication. Significant symptoms also include eye redness and itching. Significant symptoms that are not present include rhinorrhea.  Had hernia surgery yesterday then developed eye redness and facial redness and itching Saturday evening told by surgery PA to stop vicodin which was a new medication for the patient.  Told to take benadryl but not working  Past Medical History  Diagnosis Date  . Irregular heart beat   . Palpitations   . GERD (gastroesophageal reflux disease)   . Hyperlipidemia   . Hypertension   . TMJ arthralgia   . Bronchitis, acute   . Cystitis   . Joint pain   . PVC's (premature ventricular contractions)     Past Surgical History  Procedure Date  . Dilation and curettage of uterus   . Endometrial ablation   . Hernia repair     Family History  Problem Relation Age of Onset  . Dementia Mother   . Leukemia Child       History  Substance Use Topics  . Smoking status: Never Smoker   . Smokeless tobacco: Not on file  . Alcohol Use: No    OB History    Grav Para Term Preterm Abortions TAB SAB Ect Mult Living                  Review of Systems  Constitutional: Negative for fever and activity change.  HENT: Negative for facial swelling and rhinorrhea.   Eyes: Positive for redness.  Respiratory: Negative for cough, shortness of breath and wheezing.   Cardiovascular: Negative for palpitations.  Gastrointestinal: Negative for nausea, vomiting, abdominal pain and diarrhea.  Genitourinary: Negative for difficulty urinating.  Musculoskeletal: Negative.   Skin: Positive for itching and rash.  Neurological: Negative for dizziness.  Hematological: Negative.   Psychiatric/Behavioral: Negative.  Negative for altered mental status.    Allergies  Review of patient's allergies indicates no known allergies.  Home Medications   Current Outpatient Rx  Name Route Sig Dispense Refill  . ALBUTEROL SULFATE HFA 108 (90 BASE) MCG/ACT IN AERS Inhalation Inhale 2 puffs into the lungs every 6 (six) hours as needed.      . BUPROPION HCL ER (XL) 300 MG PO TB24 Oral Take 300 mg by mouth daily.      . ETODOLAC 500 MG PO TABS      . FAMOTIDINE 20 MG PO TABS Oral Take 1 tablet (20 mg total) by mouth 2 (two) times daily. 10 tablet 0  . HYDROCHLOROTHIAZIDE 25  MG PO TABS Oral Take 25 mg by mouth daily.      Marland Kitchen METOPROLOL SUCCINATE ER 50 MG PO TB24 Oral Take 50 mg by mouth daily.      Marland Kitchen OMEPRAZOLE MAGNESIUM 20 MG PO TBEC Oral Take 20 mg by mouth daily.      Marland Kitchen PREDNISONE 50 MG PO TABS Oral Take 1 tablet (50 mg total) by mouth daily. 5 tablet 0  . ALIGN PO Oral Take by mouth daily.      . QUINAPRIL HCL 40 MG PO TABS Oral Take 40 mg by mouth at bedtime.      Marland Kitchen SIMVASTATIN 20 MG PO TABS Oral Take 20 mg by mouth at bedtime.      . TOBRAMYCIN-DEXAMETHASONE 0.3-0.1 % OP SUSP        BP 159/78  Pulse 78  Temp(Src) 98.6 F  (37 C) (Oral)  Resp 20  Ht 5\' 4"  (1.626 m)  Wt 160 lb (72.576 kg)  BMI 27.46 kg/m2  SpO2 99%  Physical Exam  Constitutional: She is oriented to person, place, and time. She appears well-developed and well-nourished.  HENT:  Mouth/Throat: Oropharynx is clear and moist. No oropharyngeal exudate.  Eyes: Pupils are equal, round, and reactive to light. Right eye exhibits no discharge. Left eye exhibits no discharge.       Injected conjunctiva B  Neck: Normal range of motion. Neck supple.  Cardiovascular: Normal rate and regular rhythm.   Pulmonary/Chest: Effort normal and breath sounds normal. No stridor. She has no wheezes.  Abdominal: Soft. Bowel sounds are normal. There is no rebound and no guarding.       Incision CDI  Musculoskeletal: Normal range of motion.  Lymphadenopathy:    She has no cervical adenopathy.  Neurological: She is alert and oriented to person, place, and time.  Skin: Skin is warm and dry. Rash noted.     Psychiatric: Thought content normal.    ED Course  Procedures (including critical care time)  Labs Reviewed - No data to display No results found.   1. Allergic reaction       MDM  Allergic reaction limited to the face.  VIcodin may allergen but given recent surgery tap to secure ET tube or adhesives used during anesthesia could also have caused the localized reaction.  Markedly improved post meds.  Stop vicodin call surgeon and clear prednisone use with him via phone.  Patient verbalize understanding and agrees to follow up        Annel Zunker K Laniyah Rosenwald-Rasch, MD 02/09/11 [phone]

## 2011-02-10 ENCOUNTER — Encounter (INDEPENDENT_AMBULATORY_CARE_PROVIDER_SITE_OTHER): Payer: Self-pay | Admitting: Surgery

## 2011-02-14 ENCOUNTER — Emergency Department (HOSPITAL_BASED_OUTPATIENT_CLINIC_OR_DEPARTMENT_OTHER)
Admission: EM | Admit: 2011-02-14 | Discharge: 2011-02-14 | Disposition: A | Discharge status: Home / Self Care | Payer: BC Managed Care – PPO | Attending: Emergency Medicine | Admitting: Emergency Medicine

## 2011-02-14 ENCOUNTER — Encounter (HOSPITAL_BASED_OUTPATIENT_CLINIC_OR_DEPARTMENT_OTHER): Payer: Self-pay | Admitting: *Deleted

## 2011-02-14 ENCOUNTER — Emergency Department (INDEPENDENT_AMBULATORY_CARE_PROVIDER_SITE_OTHER): Payer: BC Managed Care – PPO

## 2011-02-14 DIAGNOSIS — R1310 Dysphagia, unspecified: Secondary | ICD-10-CM | POA: Insufficient documentation

## 2011-02-14 DIAGNOSIS — R05 Cough: Secondary | ICD-10-CM

## 2011-02-14 DIAGNOSIS — R490 Dysphonia: Secondary | ICD-10-CM

## 2011-02-14 DIAGNOSIS — Z79899 Other long term (current) drug therapy: Secondary | ICD-10-CM | POA: Insufficient documentation

## 2011-02-14 DIAGNOSIS — I10 Essential (primary) hypertension: Secondary | ICD-10-CM | POA: Insufficient documentation

## 2011-02-14 DIAGNOSIS — E785 Hyperlipidemia, unspecified: Secondary | ICD-10-CM | POA: Insufficient documentation

## 2011-02-14 DIAGNOSIS — R059 Cough, unspecified: Secondary | ICD-10-CM

## 2011-02-14 DIAGNOSIS — R0789 Other chest pain: Secondary | ICD-10-CM

## 2011-02-14 DIAGNOSIS — K219 Gastro-esophageal reflux disease without esophagitis: Secondary | ICD-10-CM | POA: Insufficient documentation

## 2011-02-14 NOTE — ED Provider Notes (Signed)
History     CSN: [id number]  Arrival date & time 02/14/11  0054   First MD Initiated Contact with Patient 02/14/11 0112      Chief Complaint  Patient presents with  . Sore Throat    (Consider location/radiation/quality/duration/timing/severity/associated sxs/prior treatment) HPI Patient is a 61 year old female who is one week status post umbilical hernia repair who presents today complaining of scratchy throat and hoarse voice since procedure. She does not know if there were any difficulties with endotracheal intubation during the procedure. Patient states that she has used an inhaler with only minimal benefit. She was on prednisone for a few days based on an allergic reaction on her face following the procedure. She had to stop this secondary to side effects of the medication. Patient says she feels like she has to clear her throat a lot with this. She also feels like something is stuck in her throat. Patient is able to speak in full sentences. She does have hoarse voice. She has no difficulty with swallowing solids or liquids. She does not have any known sick contacts. She sometimes has bronchitis and this may resemble presentations without from previous. She denies any fevers. She is scheduled to followup with her surgeon tomorrow. There are no other associated or modifying factors. Past Medical History  Diagnosis Date  . Irregular heart beat   . Palpitations   . GERD (gastroesophageal reflux disease)   . Hyperlipidemia   . Hypertension   . TMJ arthralgia   . Bronchitis, acute   . Cystitis   . Joint pain   . PVC's (premature ventricular contractions)     Past Surgical History  Procedure Date  . Dilation and curettage of uterus   . Endometrial ablation   . Hernia repair     Family History  Problem Relation Age of Onset  . Dementia Mother   . Leukemia Child     History  Substance Use Topics  . Smoking status: Never Smoker   . Smokeless tobacco: Not on file  . Alcohol  Use: No    OB History    Grav Para Term Preterm Abortions TAB SAB Ect Mult Living                  Review of Systems  Constitutional: Negative.   HENT: Positive for sore throat and voice change.   Eyes: Negative.   Respiratory: Negative.   Cardiovascular: Negative.   Gastrointestinal: Negative.   Genitourinary: Negative.   Musculoskeletal: Negative.   Neurological: Negative.   Hematological: Negative.   Psychiatric/Behavioral: Negative.   All other systems reviewed and are negative.    Allergies  Hydrocodone  Home Medications   Current Outpatient Rx  Name Route Sig Dispense Refill  . ACETAMINOPHEN 500 MG PO TABS Oral Take 1,000 mg by mouth every 6 (six) hours as needed. For pain     . ALBUTEROL SULFATE HFA 108 (90 BASE) MCG/ACT IN AERS Inhalation Inhale 2 puffs into the lungs every 6 (six) hours as needed. For shortness of breath and wheezing    . BUPROPION HCL ER (XL) 300 MG PO TB24 Oral Take 300 mg by mouth daily.      Marland Kitchen CALCIUM CARBONATE-VITAMIN D 600-400 MG-UNIT PO TABS Oral Take 1 tablet by mouth daily.      Marland Kitchen DICYCLOMINE HCL 10 MG PO CAPS Oral Take 10 mg by mouth 4 (four) times daily -  before meals and at bedtime. For IBS     . ETODOLAC 500  MG PO TABS Oral Take 500 mg by mouth daily as needed. For pain    . EVENING PRIMROSE OIL 1000 MG PO CAPS Oral Take 2 capsules by mouth daily.      Marland Kitchen FAMOTIDINE 20 MG PO TABS Oral Take 1 tablet (20 mg total) by mouth 2 (two) times daily. 10 tablet 0  . FLAXSEED (LINSEED) 1000 MG PO CAPS Oral Take 2 capsules by mouth daily.      Marland Kitchen HYDROCHLOROTHIAZIDE 25 MG PO TABS Oral Take 25 mg by mouth daily.      Marland Kitchen LOSARTAN POTASSIUM 50 MG PO TABS Oral Take 50 mg by mouth daily.      Marland Kitchen METOPROLOL SUCCINATE ER 50 MG PO TB24 Oral Take 50 mg by mouth daily.      . ADULT MULTIVITAMIN W/MINERALS CH Oral Take 1 tablet by mouth daily.      Marland Kitchen OMEPRAZOLE MAGNESIUM 20 MG PO TBEC Oral Take 20 mg by mouth daily.      Marland Kitchen PREDNISONE 50 MG PO TABS Oral Take  1 tablet (50 mg total) by mouth daily. 5 tablet 0  . ALIGN PO Oral Take 1 capsule by mouth daily.     Marland Kitchen SIMVASTATIN 20 MG PO TABS Oral Take 20 mg by mouth daily.       BP 188/91  Pulse 64  Temp(Src) 97.8 F (36.6 C) (Oral)  Resp 18  Ht 5\' 4"  (1.626 m)  Wt 160 lb (72.576 kg)  BMI 27.46 kg/m2  SpO2 100%  Physical Exam  Nursing note and vitals reviewed. Constitutional: She is oriented to person, place, and time. She appears well-developed and well-nourished. No distress.  HENT:  Head: Normocephalic and atraumatic.  Mouth/Throat: Posterior oropharyngeal erythema present. No oropharyngeal exudate or posterior oropharyngeal edema.  Eyes: Conjunctivae and EOM are normal. Pupils are equal, round, and reactive to light.  Neck: Normal range of motion.  Cardiovascular: Normal rate, regular rhythm, normal heart sounds and intact distal pulses.  Exam reveals no gallop and no friction rub.   No murmur heard. Pulmonary/Chest: Effort normal and breath sounds normal. No respiratory distress. She has no wheezes. She has no rales.  Abdominal: Soft. Bowel sounds are normal. She exhibits no distension. There is no tenderness. There is no rebound and no guarding.  Musculoskeletal: Normal range of motion. She exhibits no edema.  Neurological: She is alert and oriented to person, place, and time. No cranial nerve deficit. She exhibits normal muscle tone. Coordination normal.  Skin: Skin is warm and dry. No rash noted.  Psychiatric: She has a normal mood and affect.    ED Course  Procedures (including critical care time)  Labs Reviewed - No data to display Dg Chest 2 View  02/14/2011  *RADIOLOGY REPORT*  Clinical Data: Chest tightness.  Resend umbilical hernia repair.  CHEST - 2 VIEW  Comparison: Chest CT 06/02/2008.  Findings:  Cardiopericardial silhouette within normal limits. Mediastinal contours normal. Trachea midline.  No airspace disease or effusion.  IMPRESSION: No active cardiopulmonary  disease.  Original Report Authenticated By: Andreas Newport, M.D.     1. Hoarseness of voice   2. Dysphagia       MDM  Patient was well-appearing but was concerned about her course of voice as well as some associated tightness in her chest with this. Patient did have a chest x-ray performed to evaluate for possible bronchitic changes or the possibility the patient has developed an infectious process since she was recently intubated for procedure. We  discussed that she could followup with her surgeon to find out the name of the anesthesiologist who performed her procedure. If she is continuing to have difficulties she might consider seeing them in clinic for possible evaluation of her cords to ensure there was no damage during procedure. Without an operative record I can't comment further on this. Patient does not have emergent need for further airway evaluation. She was able to speak and breathe easily and has had no difficulty with eating or drinking. Chest x-ray was negative. Patient was reassured. She can followup with her regular doctor if symptoms persist.        Cyndra Numbers, MD 02/14/11 [id number]

## 2011-02-14 NOTE — ED Notes (Signed)
MD at bedside. 

## 2011-02-14 NOTE — ED Provider Notes (Signed)
Evaluation and management procedures were performed by the PA/NP under my supervision/collaboration.    Felisa Bonier, MD 02/14/11 [phone]

## 2011-02-14 NOTE — ED Notes (Signed)
Patient transported to X-ray 

## 2011-02-14 NOTE — ED Notes (Signed)
Pt has been having itchy throat and hoarse voice since surgery 1 week ago. NAD noted in triage.

## 2011-04-13 ENCOUNTER — Encounter (HOSPITAL_BASED_OUTPATIENT_CLINIC_OR_DEPARTMENT_OTHER): Payer: Self-pay

## 2011-04-13 ENCOUNTER — Emergency Department (HOSPITAL_BASED_OUTPATIENT_CLINIC_OR_DEPARTMENT_OTHER)
Admission: EM | Admit: 2011-04-13 | Discharge: 2011-04-13 | Disposition: A | Discharge status: Home / Self Care | Payer: BC Managed Care – PPO | Attending: Emergency Medicine | Admitting: Emergency Medicine

## 2011-04-13 DIAGNOSIS — K219 Gastro-esophageal reflux disease without esophagitis: Secondary | ICD-10-CM | POA: Insufficient documentation

## 2011-04-13 DIAGNOSIS — B9789 Other viral agents as the cause of diseases classified elsewhere: Secondary | ICD-10-CM | POA: Insufficient documentation

## 2011-04-13 DIAGNOSIS — B349 Viral infection, unspecified: Secondary | ICD-10-CM

## 2011-04-13 DIAGNOSIS — I10 Essential (primary) hypertension: Secondary | ICD-10-CM | POA: Insufficient documentation

## 2011-04-13 DIAGNOSIS — E785 Hyperlipidemia, unspecified: Secondary | ICD-10-CM | POA: Insufficient documentation

## 2011-04-13 MED ORDER — PREDNISONE 20 MG PO TABS: 40.0000 mg | ORAL_TABLET | Freq: Every day | ORAL | Status: AC

## 2011-04-13 NOTE — ED Provider Notes (Signed)
History  Scribed for Cynthia Sprout, MD, the patient was seen in room MH06/MH06. This chart was scribed by Candelaria Stagers. The patient's care started at 3:32 PM    CSN: [id number]  Arrival date & time 04/13/11  1419   First MD Initiated Contact with Patient 04/13/11 1522      Chief Complaint  Patient presents with  . Influenza     Patient is a 61 y.o. female presenting with flu symptoms. The history is provided by the patient.  Influenza This is a new problem. The current episode started more than 2 days ago. The problem occurs constantly. The problem has been gradually worsening. Associated symptoms include shortness of breath. The symptoms are aggravated by nothing. The symptoms are relieved by nothing. Treatments tried: mucinex, advair, albuterol. The treatment provided mild relief.   Cynthia Peters is a 61 y.o. female who presents to the Emergency Department complaining of flu like sx that started about four days ago and have gotten worse since.  She went to her PCP and was given advair, abluterol inhaler, and mucinex with minimal relief.  She is experiencing diarrhea, fever, SOB, wheezing, tightness in chest, and an unproductive cough.  She denies rash, blood in stool, and vomiting.  She also states that she has been in contact with mice at work and is worried about this exposure.  She has a h/o asthma like sx associated with h/o bronchitis that she states she has several times a year.      Past Medical History  Diagnosis Date  . Irregular heart beat   . Palpitations   . GERD (gastroesophageal reflux disease)   . Hyperlipidemia   . Hypertension   . TMJ arthralgia   . Bronchitis, acute   . Cystitis   . Joint pain   . PVC's (premature ventricular contractions)     Past Surgical History  Procedure Date  . Dilation and curettage of uterus   . Endometrial ablation   . Hernia repair     Family History  Problem Relation Age of Onset  . Dementia Mother   .  Leukemia Child     History  Substance Use Topics  . Smoking status: Never Smoker   . Smokeless tobacco: Not on file  . Alcohol Use: No    OB History    Grav Para Term Preterm Abortions TAB SAB Ect Mult Living                  Review of Systems  Constitutional: Positive for fever and chills.  Respiratory: Positive for cough, chest tightness, shortness of breath and wheezing.   Gastrointestinal: Positive for diarrhea. Negative for vomiting and blood in stool.  Skin: Negative for rash.  All other systems reviewed and are negative.    Allergies  Hydrocodone  Home Medications   Current Outpatient Rx  Name Route Sig Dispense Refill  . ACETAMINOPHEN 500 MG PO TABS Oral Take 1,000 mg by mouth every 6 (six) hours as needed. For pain     . ALBUTEROL SULFATE HFA 108 (90 BASE) MCG/ACT IN AERS Inhalation Inhale 2 puffs into the lungs every 6 (six) hours as needed. For shortness of breath and wheezing    . BUPROPION HCL ER (XL) 300 MG PO TB24 Oral Take 300 mg by mouth daily.      Marland Kitchen CALCIUM CARBONATE-VITAMIN D 600-400 MG-UNIT PO TABS Oral Take 1 tablet by mouth daily.      Marland Kitchen DICYCLOMINE HCL 10 MG PO CAPS  Oral Take 10 mg by mouth 4 (four) times daily -  before meals and at bedtime. For IBS     . ETODOLAC 500 MG PO TABS Oral Take 500 mg by mouth daily as needed. For pain    . EVENING PRIMROSE OIL 1000 MG PO CAPS Oral Take 2 capsules by mouth daily.      Marland Kitchen FAMOTIDINE 20 MG PO TABS Oral Take 1 tablet (20 mg total) by mouth 2 (two) times daily. 10 tablet 0  . FLAXSEED (LINSEED) 1000 MG PO CAPS Oral Take 2 capsules by mouth daily.      Marland Kitchen HYDROCHLOROTHIAZIDE 25 MG PO TABS Oral Take 25 mg by mouth daily.      Marland Kitchen LOSARTAN POTASSIUM 50 MG PO TABS Oral Take 50 mg by mouth daily.      Marland Kitchen METOPROLOL SUCCINATE ER 50 MG PO TB24 Oral Take 50 mg by mouth daily.      . ADULT MULTIVITAMIN W/MINERALS CH Oral Take 1 tablet by mouth daily.      Marland Kitchen OMEPRAZOLE MAGNESIUM 20 MG PO TBEC Oral Take 20 mg by mouth  daily.      Marland Kitchen ALIGN PO Oral Take 1 capsule by mouth daily.     Marland Kitchen SIMVASTATIN 20 MG PO TABS Oral Take 20 mg by mouth daily.     Marland Kitchen PREDNISONE 50 MG PO TABS Oral Take 1 tablet (50 mg total) by mouth daily. 5 tablet 0    BP 110/78  Pulse 82  Temp(Src) 98.8 F (37.1 C) (Oral)  Resp 18  Ht 5\' 4"  (1.626 m)  Wt 160 lb (72.576 kg)  BMI 27.46 kg/m2  SpO2 98%  Physical Exam  Nursing note and vitals reviewed. Constitutional: She is oriented to person, place, and time. She appears well-developed and well-nourished. No distress.  HENT:  Head: Normocephalic and atraumatic.  Left Ear: Left ear drainage: mild.       Right ear sterile effusion.   Eyes: EOM are normal. Right eye exhibits no discharge. Left eye exhibits no discharge.  Neck: Normal range of motion. Neck supple.  Cardiovascular: Normal rate and regular rhythm.   Pulmonary/Chest: Effort normal. No respiratory distress.  Abdominal: She exhibits no mass. There is no tenderness.  Musculoskeletal: Normal range of motion. She exhibits no edema.  Neurological: She is alert and oriented to person, place, and time.  Skin: Skin is warm and dry. No rash noted. She is not diaphoretic.    ED Course  Procedures   DIAGNOSTIC STUDIES: Oxygen Saturation is 98% on room air, normal by my interpretation.    COORDINATION OF CARE:     Labs Reviewed - No data to display No results found.   1. Viral syndrome       MDM  Pt with symptoms consistent with viral URI.  Well appearing here.  No signs of breathing difficulty  No signs of pharyngitis, otitis or abnormal abdominal findings.   Lungs are clear and no need for CXR and pt to return with any further problems.  I personally performed the services described in this documentation, which was scribed in my presence.  The recorded information has been reviewed and considered.         Cynthia Sprout, MD 04/14/11 [phone]

## 2011-04-13 NOTE — ED Notes (Signed)
Pt with diarrhea, fever, chills, body aches, congestion, nausea, cough.  Pt states that she has not vomited.  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  Non productive cough

## 2011-04-13 NOTE — ED Notes (Signed)
escript x 1 for prednisone

## 2011-05-25 ENCOUNTER — Encounter (HOSPITAL_BASED_OUTPATIENT_CLINIC_OR_DEPARTMENT_OTHER): Payer: Self-pay | Admitting: *Deleted

## 2011-05-25 ENCOUNTER — Observation Stay (HOSPITAL_BASED_OUTPATIENT_CLINIC_OR_DEPARTMENT_OTHER)
Admission: EM | Admit: 2011-05-25 | Discharge: 2011-05-27 | DRG: 832 | Disposition: A | Discharge status: Home / Self Care | Payer: BC Managed Care – PPO | Attending: Internal Medicine | Admitting: Internal Medicine

## 2011-05-25 DIAGNOSIS — R002 Palpitations: Secondary | ICD-10-CM | POA: Diagnosis present

## 2011-05-25 DIAGNOSIS — E876 Hypokalemia: Secondary | ICD-10-CM | POA: Diagnosis present

## 2011-05-25 DIAGNOSIS — E782 Mixed hyperlipidemia: Secondary | ICD-10-CM | POA: Diagnosis present

## 2011-05-25 DIAGNOSIS — H532 Diplopia: Secondary | ICD-10-CM | POA: Insufficient documentation

## 2011-05-25 DIAGNOSIS — G459 Transient cerebral ischemic attack, unspecified: Principal | ICD-10-CM

## 2011-05-25 DIAGNOSIS — K219 Gastro-esophageal reflux disease without esophagitis: Secondary | ICD-10-CM | POA: Insufficient documentation

## 2011-05-25 DIAGNOSIS — I10 Essential (primary) hypertension: Secondary | ICD-10-CM | POA: Insufficient documentation

## 2011-05-25 DIAGNOSIS — I4949 Other premature depolarization: Secondary | ICD-10-CM | POA: Insufficient documentation

## 2011-05-25 DIAGNOSIS — I493 Ventricular premature depolarization: Secondary | ICD-10-CM | POA: Diagnosis present

## 2011-05-25 DIAGNOSIS — E785 Hyperlipidemia, unspecified: Secondary | ICD-10-CM | POA: Insufficient documentation

## 2011-05-25 NOTE — ED Notes (Signed)
Pt reports she had double vision that lasted for 5 minutes while talking on the phone approx 1 hour ago- Sx have resolved at this time

## 2011-05-26 ENCOUNTER — Inpatient Hospital Stay (HOSPITAL_COMMUNITY): Payer: BC Managed Care – PPO

## 2011-05-26 ENCOUNTER — Encounter (HOSPITAL_COMMUNITY): Payer: Self-pay | Admitting: *Deleted

## 2011-05-26 ENCOUNTER — Emergency Department (INDEPENDENT_AMBULATORY_CARE_PROVIDER_SITE_OTHER): Payer: BC Managed Care – PPO

## 2011-05-26 DIAGNOSIS — H532 Diplopia: Secondary | ICD-10-CM

## 2011-05-26 DIAGNOSIS — E876 Hypokalemia: Secondary | ICD-10-CM | POA: Diagnosis present

## 2011-05-26 DIAGNOSIS — I359 Nonrheumatic aortic valve disorder, unspecified: Secondary | ICD-10-CM

## 2011-05-26 HISTORY — PX: TRANSTHORACIC ECHOCARDIOGRAM: SHX275

## 2011-05-26 LAB — CBC
HCT: 37.6 % (ref 36.0–46.0)
Hemoglobin: 13.3 g/dL (ref 12.0–15.0)
MCH: 33 pg (ref 26.0–34.0)
MCHC: 35.4 g/dL (ref 30.0–36.0)
MCV: 93.3 fL (ref 78.0–100.0)
Platelets: 221 10*3/uL (ref 150–400)
RBC: 4.03 MIL/uL (ref 3.87–5.11)
RDW: 11.9 % (ref 11.5–15.5)
WBC: 9.8 10*3/uL (ref 4.0–10.5)

## 2011-05-26 LAB — LIPID PANEL
Cholesterol: 192 mg/dL (ref 0–200)
HDL: 65 mg/dL (ref >39)
LDL Cholesterol: 103 mg/dL — ABNORMAL HIGH (ref 0–99)
Total CHOL/HDL Ratio: 3 RATIO
Triglycerides: 122 mg/dL (ref <150)
VLDL: 24 mg/dL (ref 0–40)

## 2011-05-26 LAB — DIFFERENTIAL
Basophils Absolute: 0.1 10*3/uL (ref 0.0–0.1)
Basophils Relative: 1 % (ref 0–1)
Eosinophils Absolute: 0.5 10*3/uL (ref 0.0–0.7)
Eosinophils Relative: 5 % (ref 0–5)
Lymphocytes Relative: 16 % (ref 12–46)
Lymphs Abs: 1.6 10*3/uL (ref 0.7–4.0)
Monocytes Absolute: 1 10*3/uL (ref 0.1–1.0)
Monocytes Relative: 10 % (ref 3–12)
Neutro Abs: 6.8 10*3/uL (ref 1.7–7.7)
Neutrophils Relative %: 69 % (ref 43–77)

## 2011-05-26 LAB — BASIC METABOLIC PANEL
BUN: 22 mg/dL (ref 6–23)
CO2: 28 mEq/L (ref 19–32)
Calcium: 9.6 mg/dL (ref 8.4–10.5)
Chloride: 102 mEq/L (ref 96–112)
Creatinine, Ser: 1 mg/dL (ref 0.50–1.10)
GFR calc Af Amer: 70 mL/min — ABNORMAL LOW (ref >90)
GFR calc non Af Amer: 60 mL/min — ABNORMAL LOW (ref >90)
Glucose, Bld: 108 mg/dL — ABNORMAL HIGH (ref 70–99)
Potassium: 3.4 mEq/L — ABNORMAL LOW (ref 3.5–5.1)
Sodium: 141 mEq/L (ref 135–145)

## 2011-05-26 LAB — TSH: TSH: 1.835 u[IU]/mL (ref 0.350–4.500)

## 2011-05-26 MED ORDER — LOSARTAN POTASSIUM 50 MG PO TABS
50.0000 mg | ORAL_TABLET | Freq: Every day | ORAL | Status: DC
  Administered 2011-05-26 – 2011-05-27 (×2): 50 mg via ORAL
  Filled 2011-05-26 (×2): qty 1

## 2011-05-26 MED ORDER — LORAZEPAM 2 MG/ML IJ SOLN
1.0000 mg | Freq: Four times a day (QID) | INTRAMUSCULAR | Status: DC | PRN
  Administered 2011-05-26: 1 mg via INTRAVENOUS
  Filled 2011-05-26: qty 1

## 2011-05-26 MED ORDER — ADULT MULTIVITAMIN W/MINERALS CH
1.0000 | ORAL_TABLET | Freq: Every day | ORAL | Status: DC
  Administered 2011-05-26 – 2011-05-27 (×2): 1 via ORAL
  Filled 2011-05-26 (×2): qty 1

## 2011-05-26 MED ORDER — SODIUM CHLORIDE 0.9 % IV SOLN: INTRAVENOUS | Status: DC

## 2011-05-26 MED ORDER — SODIUM CHLORIDE 0.9 % IV SOLN
INTRAVENOUS | Status: AC
  Administered 2011-05-26: 09:00:00 via INTRAVENOUS

## 2011-05-26 MED ORDER — METOPROLOL SUCCINATE ER 50 MG PO TB24
50.0000 mg | ORAL_TABLET | Freq: Every day | ORAL | Status: DC
  Administered 2011-05-26 – 2011-05-27 (×2): 50 mg via ORAL
  Filled 2011-05-26 (×2): qty 1

## 2011-05-26 MED ORDER — FENTANYL CITRATE 0.05 MG/ML IJ SOLN
INTRAMUSCULAR | Status: AC
  Administered 2011-05-26: 50 ug
  Filled 2011-05-26: qty 4

## 2011-05-26 MED ORDER — OMEPRAZOLE MAGNESIUM 20 MG PO TBEC: 20.0000 mg | DELAYED_RELEASE_TABLET | Freq: Every day | ORAL | Status: DC

## 2011-05-26 MED ORDER — PANTOPRAZOLE SODIUM 40 MG PO TBEC
40.0000 mg | DELAYED_RELEASE_TABLET | Freq: Every day | ORAL | Status: DC
  Administered 2011-05-26 – 2011-05-27 (×2): 40 mg via ORAL
  Filled 2011-05-26 (×2): qty 1

## 2011-05-26 MED ORDER — BUPROPION HCL ER (XL) 300 MG PO TB24
300.0000 mg | ORAL_TABLET | Freq: Every day | ORAL | Status: DC
  Administered 2011-05-26 – 2011-05-27 (×2): 300 mg via ORAL
  Filled 2011-05-26 (×2): qty 1

## 2011-05-26 MED ORDER — FAMOTIDINE 20 MG PO TABS
20.0000 mg | ORAL_TABLET | Freq: Two times a day (BID) | ORAL | Status: DC
  Administered 2011-05-26 – 2011-05-27 (×3): 20 mg via ORAL
  Filled 2011-05-26 (×4): qty 1

## 2011-05-26 MED ORDER — EVENING PRIMROSE OIL 1000 MG PO CAPS: 2.0000 | ORAL_CAPSULE | Freq: Every day | ORAL | Status: DC

## 2011-05-26 MED ORDER — SIMVASTATIN 20 MG PO TABS
20.0000 mg | ORAL_TABLET | Freq: Every day | ORAL | Status: DC
  Administered 2011-05-26: 20 mg via ORAL
  Filled 2011-05-26 (×2): qty 1

## 2011-05-26 MED ORDER — POTASSIUM CHLORIDE CRYS ER 20 MEQ PO TBCR
20.0000 meq | EXTENDED_RELEASE_TABLET | Freq: Every day | ORAL | Status: DC
  Administered 2011-05-26 – 2011-05-27 (×2): 20 meq via ORAL
  Filled 2011-05-26 (×2): qty 1

## 2011-05-26 MED ORDER — CALCIUM CARBONATE-VITAMIN D 500-200 MG-UNIT PO TABS
1.0000 | ORAL_TABLET | Freq: Every day | ORAL | Status: DC
  Administered 2011-05-26 – 2011-05-27 (×2): 1 via ORAL
  Filled 2011-05-26 (×4): qty 1

## 2011-05-26 MED ORDER — POTASSIUM CHLORIDE CRYS ER 20 MEQ PO TBCR
20.0000 meq | EXTENDED_RELEASE_TABLET | Freq: Once | ORAL | Status: AC
  Administered 2011-05-26: 20 meq via ORAL
  Filled 2011-05-26: qty 1

## 2011-05-26 MED ORDER — FENTANYL CITRATE 0.05 MG/ML IJ SOLN
25.0000 ug | INTRAMUSCULAR | Status: AC | PRN
  Administered 2011-05-26 (×2): 50 ug via INTRAVENOUS

## 2011-05-26 MED ORDER — FLAXSEED (LINSEED) 1000 MG PO CAPS: 2.0000 | ORAL_CAPSULE | Freq: Every day | ORAL | Status: DC

## 2011-05-26 MED ORDER — DICYCLOMINE HCL 10 MG PO CAPS
10.0000 mg | ORAL_CAPSULE | Freq: Three times a day (TID) | ORAL | Status: DC
  Administered 2011-05-26 – 2011-05-27 (×3): 10 mg via ORAL
  Filled 2011-05-26 (×9): qty 1

## 2011-05-26 MED ORDER — ALBUTEROL SULFATE HFA 108 (90 BASE) MCG/ACT IN AERS: 2.0000 | INHALATION_SPRAY | Freq: Four times a day (QID) | RESPIRATORY_TRACT | Status: DC | PRN

## 2011-05-26 MED ORDER — SODIUM CHLORIDE 0.9 % IJ SOLN
3.0000 mL | Freq: Two times a day (BID) | INTRAMUSCULAR | Status: DC
  Administered 2011-05-26 (×2): 3 mL via INTRAVENOUS

## 2011-05-26 MED ORDER — MIDAZOLAM HCL 2 MG/2ML IJ SOLN
1.0000 mg | INTRAMUSCULAR | Status: AC | PRN
  Administered 2011-05-26 (×6): 1 mg via INTRAVENOUS
  Filled 2011-05-26: qty 10

## 2011-05-26 MED ORDER — FLORA-Q PO CAPS
1.0000 | ORAL_CAPSULE | Freq: Every day | ORAL | Status: DC
  Administered 2011-05-26 – 2011-05-27 (×2): 1 via ORAL
  Filled 2011-05-26 (×2): qty 1

## 2011-05-26 MED ORDER — MIDAZOLAM HCL 2 MG/2ML IJ SOLN
INTRAMUSCULAR | Status: AC
  Administered 2011-05-26: 1 mg via INTRAVENOUS
  Filled 2011-05-26: qty 10

## 2011-05-26 MED ORDER — HYDROCHLOROTHIAZIDE 25 MG PO TABS
25.0000 mg | ORAL_TABLET | Freq: Every day | ORAL | Status: DC
  Administered 2011-05-26 – 2011-05-27 (×2): 25 mg via ORAL
  Filled 2011-05-26 (×2): qty 1

## 2011-05-26 MED ORDER — ACETAMINOPHEN 500 MG PO TABS
1000.0000 mg | ORAL_TABLET | Freq: Four times a day (QID) | ORAL | Status: DC | PRN
  Filled 2011-05-26: qty 2

## 2011-05-26 NOTE — H&P (Signed)
Cynthia Peters is an 61 y.o. female.   Chief Complaint: left eye diplopia x 3 minutes last pm; none since HPI: scheduled for MRI/MRA with sedation  Past Medical History  Diagnosis Date  . Irregular heart beat   . Palpitations   . GERD (gastroesophageal reflux disease)   . Hyperlipidemia   . Hypertension   . TMJ arthralgia   . Bronchitis, acute   . Cystitis   . Joint pain   . PVC's (premature ventricular contractions)     Past Surgical History  Procedure Date  . Dilation and curettage of uterus   . Endometrial ablation   . Hernia repair   . Cesarean section     Family History  Problem Relation Age of Onset  . Dementia Mother   . Leukemia Child    Social History:  reports that she has quit smoking. She has never used smokeless tobacco. She reports that she drinks alcohol. She reports that she does not use illicit drugs.  Allergies:  Allergies  Allergen Reactions  . Hydrocodone Rash    Medications Prior to Admission  Medication Sig Dispense Refill  . acetaminophen (TYLENOL) 500 MG tablet Take 1,000 mg by mouth every 6 (six) hours as needed. For pain       . albuterol (VENTOLIN HFA) 108 (90 BASE) MCG/ACT inhaler Inhale 2 puffs into the lungs every 6 (six) hours as needed. For shortness of breath and wheezing      . buPROPion (WELLBUTRIN XL) 300 MG 24 hr tablet Take 300 mg by mouth daily.        . Calcium Carbonate-Vitamin D (CALTRATE 600+D) 600-400 MG-UNIT per tablet Take 1 tablet by mouth daily.        Marland Kitchen dicyclomine (BENTYL) 10 MG capsule Take 10 mg by mouth 4 (four) times daily -  before meals and at bedtime. For IBS       . etodolac (LODINE) 500 MG tablet Take 500 mg by mouth daily as needed. For pain      . Evening Primrose Oil 1000 MG CAPS Take 2 capsules by mouth daily.        . Flaxseed, Linseed, 1000 MG CAPS Take 2 capsules by mouth daily.        . hydrochlorothiazide 25 MG tablet Take 25 mg by mouth daily.        Marland Kitchen losartan (COZAAR) 50 MG tablet Take 50 mg  by mouth daily.        . metoprolol (TOPROL-XL) 50 MG 24 hr tablet Take 50 mg by mouth daily.        . Multiple Vitamin (MULITIVITAMIN WITH MINERALS) TABS Take 1 tablet by mouth daily.        . Probiotic Product (ALIGN PO) Take 1 capsule by mouth daily.       . simvastatin (ZOCOR) 20 MG tablet Take 20 mg by mouth daily.         Results for orders placed during the hospital encounter of 05/25/11 (from the past 48 hour(s))  BASIC METABOLIC PANEL     Status: Abnormal   Collection Time   05/26/11 12:33 AM      Component Value Range Comment   Sodium 141  135 - 145 (mEq/L)    Potassium 3.4 (*) 3.5 - 5.1 (mEq/L)    Chloride 102  96 - 112 (mEq/L)    CO2 28  19 - 32 (mEq/L)    Glucose, Bld 108 (*) 70 - 99 (mg/dL)    BUN  22  6 - 23 (mg/dL)    Creatinine, Ser 1.61  0.50 - 1.10 (mg/dL)    Calcium 9.6  8.4 - 10.5 (mg/dL)    GFR calc non Af Amer 60 (*) >90 (mL/min)    GFR calc Af Amer 70 (*) >90 (mL/min)   CBC     Status: Normal   Collection Time   05/26/11 12:33 AM      Component Value Range Comment   WBC 9.8  4.0 - 10.5 (K/uL)    RBC 4.03  3.87 - 5.11 (MIL/uL)    Hemoglobin 13.3  12.0 - 15.0 (g/dL)    HCT 09.6  04.5 - 40.9 (%)    MCV 93.3  78.0 - 100.0 (fL)    MCH 33.0  26.0 - 34.0 (pg)    MCHC 35.4  30.0 - 36.0 (g/dL)    RDW 81.1  91.4 - 78.2 (%)    Platelets 221  150 - 400 (K/uL)   DIFFERENTIAL     Status: Normal   Collection Time   05/26/11 12:33 AM      Component Value Range Comment   Neutrophils Relative 69  43 - 77 (%)    Neutro Abs 6.8  1.7 - 7.7 (K/uL)    Lymphocytes Relative 16  12 - 46 (%)    Lymphs Abs 1.6  0.7 - 4.0 (K/uL)    Monocytes Relative 10  3 - 12 (%)    Monocytes Absolute 1.0  0.1 - 1.0 (K/uL)    Eosinophils Relative 5  0 - 5 (%)    Eosinophils Absolute 0.5  0.0 - 0.7 (K/uL)    Basophils Relative 1  0 - 1 (%)    Basophils Absolute 0.1  0.0 - 0.1 (K/uL)   TSH     Status: Normal   Collection Time   05/26/11  6:05 AM      Component Value Range Comment   TSH  1.835  0.350 - 4.500 (uIU/mL)   LIPID PANEL     Status: Abnormal   Collection Time   05/26/11 12:45 PM      Component Value Range Comment   Cholesterol 192  0 - 200 (mg/dL)    Triglycerides 956  <150 (mg/dL)    HDL 65  >21 (mg/dL)    Total CHOL/HDL Ratio 3.0      VLDL 24  0 - 40 (mg/dL)    LDL Cholesterol 308 (*) 0 - 99 (mg/dL)    Ct Head Wo Contrast  05/26/2011  *RADIOLOGY REPORT*  Clinical Data: Double vision which lasted for 5 minutes, now resolved.  CT HEAD WITHOUT CONTRAST  Technique:  Contiguous axial images were obtained from the base of the skull through the vertex without contrast.  Comparison: None.  Findings: Ventricles and sulci appear symmetrical.  No mass effect or midline shift.  No abnormal extra-axial fluid collections.  Wallace Cullens- white matter junctions are distinct.  Basal cisterns are not effaced.  No evidence of acute intracranial hemorrhage.  Visualized paranasal sinuses and mastoid air cells are not opacified.  No depressed skull fractures.  IMPRESSION: No acute intracranial abnormalities.  Original Report Authenticated By: Marlon Pel, M.D.    Review of Systems  Constitutional: Negative for fever.  Eyes: Positive for double vision.       Lt eye x 3 mins last pm  Gastrointestinal: Negative for nausea and vomiting.  Neurological: Negative for dizziness and headaches.    Blood pressure 143/70, pulse 82, temperature 97.8 F (  36.6 C), temperature source Oral, resp. rate 17, height 5\' 4"  (1.626 m), weight 157 lb 10.1 oz (71.5 kg), SpO2 100.00%. Physical Exam  Constitutional: She is oriented to person, place, and time. She appears well-developed and well-nourished.  Cardiovascular: Normal rate, regular rhythm and normal heart sounds.   No murmur heard. Respiratory: Effort normal and breath sounds normal. She has no wheezes.  GI: Soft. Bowel sounds are normal. There is no tenderness.  Musculoskeletal: Normal range of motion.  Neurological: She is alert and oriented  to person, place, and time. Coordination normal.  Skin: Skin is warm.  Psychiatric: She has a normal mood and affect. Her behavior is normal. Judgment and thought content normal.     Assessment/Plan Left eye diplopia; resolving Scheduled for MRI/MRA with sedation Pt aware of procedure benefits and risks and agreeable to proceed. Consent signed.  Handsome Anglin A 05/26/2011, 2:22 PM

## 2011-05-26 NOTE — Progress Notes (Signed)
VASCULAR LAB PRELIMINARY  PRELIMINARY  PRELIMINARY  PRELIMINARY  Carotid duplex  completed.    Preliminary report:  Bilateral:  No evidence of hemodynamically significant internal carotid artery stenosis.   Vertebral artery flow is antegrade.      Terance Hart, RVT 05/26/2011, 12:28 PM

## 2011-05-26 NOTE — ED Notes (Signed)
Scan complete.

## 2011-05-26 NOTE — H&P (Signed)
Cynthia Peters is an 60 y.o. female.   Chief Complaint: double vision in the left eye HPI: 61 yo female with hx of htn, hyperlipidemia, has c/o double vision in the left eye x 3-5 minutes. Pt has had floaters in the past.  Sx disappeared 2 hours prior to going to ED at [facility] where she was evaluate and thought to have Tia,  CT brain negative for any acute process.   Denies ha, hx of eye trauma, fever, chills, numbness, tingling , focal weakness.   Past Medical History  Diagnosis Date  . Irregular heart beat   . Palpitations   . GERD (gastroesophageal reflux disease)   . Hyperlipidemia   . Hypertension   . TMJ arthralgia   . Bronchitis, acute   . Cystitis   . Joint pain   . PVC's (premature ventricular contractions)     Past Surgical History  Procedure Date  . Dilation and curettage of uterus   . Endometrial ablation   . Hernia repair   . Cesarean section     Family History  Problem Relation Age of Onset  . Dementia Mother   . Leukemia Child    Social History:  reports that she has quit smoking. She has never used smokeless tobacco. She reports that she drinks alcohol. She reports that she does not use illicit drugs.  Allergies:  Allergies  Allergen Reactions  . Hydrocodone Rash    Medications Prior to Admission  Medication Dose Route Frequency Provider Last Rate Last Dose  . 0.9 %  sodium chloride infusion   Intravenous Continuous Carlisle Beers Molpus, MD       Medications Prior to Admission  Medication Sig Dispense Refill  . buPROPion (WELLBUTRIN XL) 300 MG 24 hr tablet Take 300 mg by mouth daily.        . Calcium Carbonate-Vitamin D (CALTRATE 600+D) 600-400 MG-UNIT per tablet Take 1 tablet by mouth daily.        Marland Kitchen dicyclomine (BENTYL) 10 MG capsule Take 10 mg by mouth 4 (four) times daily -  before meals and at bedtime. For IBS       . Evening Primrose Oil 1000 MG CAPS Take 2 capsules by mouth daily.        . Flaxseed, Linseed, 1000 MG CAPS Take 2 capsules by  mouth daily.        . hydrochlorothiazide 25 MG tablet Take 25 mg by mouth daily.        Marland Kitchen losartan (COZAAR) 50 MG tablet Take 50 mg by mouth daily.        . metoprolol (TOPROL-XL) 50 MG 24 hr tablet Take 50 mg by mouth daily.        . Multiple Vitamin (MULITIVITAMIN WITH MINERALS) TABS Take 1 tablet by mouth daily.        . Probiotic Product (ALIGN PO) Take 1 capsule by mouth daily.       . simvastatin (ZOCOR) 20 MG tablet Take 20 mg by mouth daily.       Marland Kitchen acetaminophen (TYLENOL) 500 MG tablet Take 1,000 mg by mouth every 6 (six) hours as needed. For pain       . albuterol (VENTOLIN HFA) 108 (90 BASE) MCG/ACT inhaler Inhale 2 puffs into the lungs every 6 (six) hours as needed. For shortness of breath and wheezing      . etodolac (LODINE) 500 MG tablet Take 500 mg by mouth daily as needed. For pain      .  famotidine (PEPCID) 20 MG tablet Take 1 tablet (20 mg total) by mouth 2 (two) times daily.  10 tablet  0  . omeprazole (PRILOSEC OTC) 20 MG tablet Take 20 mg by mouth daily.          Results for orders placed during the hospital encounter of 05/25/11 (from the past 48 hour(s))  BASIC METABOLIC PANEL     Status: Abnormal   Collection Time   05/26/11 12:33 AM      Component Value Range Comment   Sodium 141  135 - 145 (mEq/L)    Potassium 3.4 (*) 3.5 - 5.1 (mEq/L)    Chloride 102  96 - 112 (mEq/L)    CO2 28  19 - 32 (mEq/L)    Glucose, Bld 108 (*) 70 - 99 (mg/dL)    BUN 22  6 - 23 (mg/dL)    Creatinine, Ser 1.30  0.50 - 1.10 (mg/dL)    Calcium 9.6  8.4 - 10.5 (mg/dL)    GFR calc non Af Amer 60 (*) >90 (mL/min)    GFR calc Af Amer 70 (*) >90 (mL/min)   CBC     Status: Normal   Collection Time   05/26/11 12:33 AM      Component Value Range Comment   WBC 9.8  4.0 - 10.5 (K/uL)    RBC 4.03  3.87 - 5.11 (MIL/uL)    Hemoglobin 13.3  12.0 - 15.0 (g/dL)    HCT 86.5  78.4 - 69.6 (%)    MCV 93.3  78.0 - 100.0 (fL)    MCH 33.0  26.0 - 34.0 (pg)    MCHC 35.4  30.0 - 36.0 (g/dL)    RDW 29.5   28.4 - 13.2 (%)    Platelets 221  150 - 400 (K/uL)   DIFFERENTIAL     Status: Normal   Collection Time   05/26/11 12:33 AM      Component Value Range Comment   Neutrophils Relative 69  43 - 77 (%)    Neutro Abs 6.8  1.7 - 7.7 (K/uL)    Lymphocytes Relative 16  12 - 46 (%)    Lymphs Abs 1.6  0.7 - 4.0 (K/uL)    Monocytes Relative 10  3 - 12 (%)    Monocytes Absolute 1.0  0.1 - 1.0 (K/uL)    Eosinophils Relative 5  0 - 5 (%)    Eosinophils Absolute 0.5  0.0 - 0.7 (K/uL)    Basophils Relative 1  0 - 1 (%)    Basophils Absolute 0.1  0.0 - 0.1 (K/uL)    Ct Head Wo Contrast  05/26/2011  *RADIOLOGY REPORT*  Clinical Data: Double vision which lasted for 5 minutes, now resolved.  CT HEAD WITHOUT CONTRAST  Technique:  Contiguous axial images were obtained from the base of the skull through the vertex without contrast.  Comparison: None.  Findings: Ventricles and sulci appear symmetrical.  No mass effect or midline shift.  No abnormal extra-axial fluid collections.  Wallace Cullens- white matter junctions are distinct.  Basal cisterns are not effaced.  No evidence of acute intracranial hemorrhage.  Visualized paranasal sinuses and mastoid air cells are not opacified.  No depressed skull fractures.  IMPRESSION: No acute intracranial abnormalities.  Original Report Authenticated By: Marlon Pel, M.D.    Review of Systems  Constitutional: Negative for fever, chills and weight loss.  HENT: Negative for hearing loss, ear pain, neck pain and tinnitus.   Eyes: Negative for blurred  vision, double vision and photophobia.  Respiratory: Negative for cough, hemoptysis and sputum production.   Cardiovascular: Negative for chest pain, palpitations and orthopnea.  Gastrointestinal: Negative for heartburn, nausea and vomiting.  Genitourinary: Negative for dysuria and urgency.  Musculoskeletal: Negative for myalgias.  Skin: Negative for itching and rash.  Neurological: Negative for dizziness, tingling, tremors and  headaches.  Endo/Heme/Allergies: Negative for environmental allergies. Does not bruise/bleed easily.  Psychiatric/Behavioral: Negative for depression, suicidal ideas and substance abuse.    Blood pressure 158/74, pulse 71, temperature 98.9 F (37.2 C), temperature source Oral, resp. rate 14, height 5\' 4"  (1.626 m), weight 71.5 kg (157 lb 10.1 oz), SpO2 95.00%. Physical Exam  Constitutional: She is oriented to person, place, and time. She appears well-developed and well-nourished.  HENT:  Head: Normocephalic and atraumatic.  Right Ear: External ear normal.  Left Ear: External ear normal.  Mouth/Throat: No oropharyngeal exudate.  Eyes: Conjunctivae and EOM are normal. Pupils are equal, round, and reactive to light. Right eye exhibits no discharge. Left eye exhibits no discharge. No scleral icterus.  Neck: Normal range of motion. Neck supple. No JVD present. No tracheal deviation present. No thyromegaly present.       No nystagmus  Cardiovascular: Normal rate, regular rhythm and normal heart sounds.  Exam reveals no gallop and no friction rub.   No murmur heard. Respiratory: Effort normal and breath sounds normal. No stridor. No respiratory distress. She has no wheezes. She has no rales. She exhibits no tenderness.  GI: Soft. Bowel sounds are normal. She exhibits no distension and no mass. There is no tenderness. There is no rebound and no guarding.  Musculoskeletal: Normal range of motion. She exhibits no edema and no tenderness.  Lymphadenopathy:    She has no cervical adenopathy.  Neurological: She is alert and oriented to person, place, and time. She displays normal reflexes. No cranial nerve deficit. She exhibits normal muscle tone. Coordination normal.  Skin: Skin is warm and dry. No rash noted. No erythema. No pallor.  Psychiatric: She has a normal mood and affect. Her behavior is normal. Thought content normal.     Assessment/Plan L eye diplopia:  R/o TIA  Check MRI brain  noncontrast, note that pt is claustrophobic, if we have open mri, try this first./  Otherwise ativan.  Check for myasthenia Please obtain opthamology consult in am, and possibly neurology consult as well  Hypertension:  Cont metoprolol, losartan, hctz  Hyperlipidemia: cont simvastatin.   Gerd: cont omeprazole  Pearson Grippe 05/26/2011, 3:28 AM

## 2011-05-26 NOTE — ED Notes (Signed)
I assisted by walking with her to the bathroom

## 2011-05-26 NOTE — Progress Notes (Signed)
Patient went to MRI and was unable to tolerate the procedure d/t clausterphobia. Pt did receive IV ativan prior to going for MRI. Dr Selena Batten made aware of pt not tolerating procedure and he placed new orders. Patient and family aware of plans to retry MRI later in day. Day shift RN aware and will cont to follow.

## 2011-05-26 NOTE — Progress Notes (Signed)
I agree with the assessments and medication admin done by Megan Roberts [employer] student from 7p to 7a. 

## 2011-05-26 NOTE — ED Provider Notes (Addendum)
History   This chart was scribed for Cynthia Seamen, MD scribed by Magnus Sinning. The patient was seen in room MH11/MH11 seen at 00:33.    CSN: [id number]  Arrival date & time 05/25/11  2325   First MD Initiated Contact with Patient 05/26/11 0031      Chief Complaint  Patient presents with  . Diplopia    (Consider location/radiation/quality/duration/timing/severity/associated sxs/prior treatment) HPI Cynthia Peters is a 61 y.o. female who presents to the Emergency Department complaining of double vision which she perceived to be in the left eye, lasting about 3-5 minutes. It occurred while she was on the phone two hours ago.  Says she is not currently having sxs. Pt does wear contacts, but was not wearing them at the time. Denies any associated pain, difficulty with speech, motor function, sensory function or coordination. There are no known exacerbating or mitigating factors. PCP: [facility].   Past Medical History  Diagnosis Date  . Irregular heart beat   . Palpitations   . GERD (gastroesophageal reflux disease)   . Hyperlipidemia   . Hypertension   . TMJ arthralgia   . Bronchitis, acute   . Cystitis   . Joint pain   . PVC's (premature ventricular contractions)     Past Surgical History  Procedure Date  . Dilation and curettage of uterus   . Endometrial ablation   . Hernia repair     Family History  Problem Relation Age of Onset  . Dementia Mother   . Leukemia Child     History  Substance Use Topics  . Smoking status: Former Games developer  . Smokeless tobacco: Never Used  . Alcohol Use: Yes     occasional   Review of Systems 10 Systems reviewed and are negative for acute change except as noted in the HPI.  Allergies  Hydrocodone  Home Medications   Current Outpatient Rx  Name Route Sig Dispense Refill  . ACETAMINOPHEN 500 MG PO TABS Oral Take 1,000 mg by mouth every 6 (six) hours as needed. For pain     . ALBUTEROL SULFATE  HFA 108 (90 BASE) MCG/ACT IN AERS Inhalation Inhale 2 puffs into the lungs every 6 (six) hours as needed. For shortness of breath and wheezing    . BUPROPION HCL ER (XL) 300 MG PO TB24 Oral Take 300 mg by mouth daily.      Marland Kitchen CALCIUM CARBONATE-VITAMIN D 600-400 MG-UNIT PO TABS Oral Take 1 tablet by mouth daily.      Marland Kitchen DICYCLOMINE HCL 10 MG PO CAPS Oral Take 10 mg by mouth 4 (four) times daily -  before meals and at bedtime. For IBS     . ETODOLAC 500 MG PO TABS Oral Take 500 mg by mouth daily as needed. For pain    . EVENING PRIMROSE OIL 1000 MG PO CAPS Oral Take 2 capsules by mouth daily.      Marland Kitchen FAMOTIDINE 20 MG PO TABS Oral Take 1 tablet (20 mg total) by mouth 2 (two) times daily. 10 tablet 0  . FLAXSEED (LINSEED) 1000 MG PO CAPS Oral Take 2 capsules by mouth daily.      Marland Kitchen HYDROCHLOROTHIAZIDE 25 MG PO TABS Oral Take 25 mg by mouth daily.      Marland Kitchen LOSARTAN POTASSIUM 50 MG PO TABS Oral Take 50 mg by mouth daily.      Marland Kitchen METOPROLOL SUCCINATE ER 50 MG PO TB24 Oral Take 50 mg by mouth daily.      Marland Kitchen  ADULT MULTIVITAMIN W/MINERALS CH Oral Take 1 tablet by mouth daily.      Marland Kitchen OMEPRAZOLE MAGNESIUM 20 MG PO TBEC Oral Take 20 mg by mouth daily.      Marland Kitchen ALIGN PO Oral Take 1 capsule by mouth daily.     Marland Kitchen SIMVASTATIN 20 MG PO TABS Oral Take 20 mg by mouth daily.      Physical Exam BP 140/83  Pulse 76  Temp(Src) 98.3 F (36.8 C) (Oral)  Resp 18  Ht 5\' 4"  (1.626 m)  Wt 158 lb (71.668 kg)  BMI 27.12 kg/m2  SpO2 95%  General Appearance:    Alert, cooperative, no distress, appears stated age  Head:    Normocephalic, without obvious abnormality, atraumatic  Eyes:    PERRL, conjunctiva/corneas clear, EOM's intact with no diplopia. No evidence of lens displacement of the left eye.  Ears:    Normal TM's and external ear canals, both ears  Nose:   Nares normal, septum midline, mucosa normal, no drainage    or sinus tenderness  Throat:   Lips, mucosa, and tongue normal; teeth and gums normal  Neck:   Supple,  symmetrical, trachea midline, no adenopathy;    thyroid:  no enlargement/tenderness/nodules; no carotid   bruit or JVD  Back:     Symmetric, no curvature, ROM normal, no CVA tenderness  Lungs:     Clear to auscultation bilaterally, respirations unlabored  Chest Wall:    No tenderness or deformity   Heart:    Regular rate and rhythm, S1 and S2 normal, no murmur, rub   or gallop  Breast Exam:      Abdomen:     Soft, non-tender, bowel sounds active all four quadrants,    no masses, no organomegaly  Genitalia:    Rectal:    Extremities:   Extremities normal, atraumatic, no cyanosis or edema  Pulses:   2+ and symmetric all extremities  Skin:   Skin color, texture, turgor normal, no rashes or lesions  Lymph nodes:   Cervical, supraclavicular, and axillary nodes normal  Neurologic:   no facial droop, normal speech, strength, sensation and coordination    throughout    ED Course  Procedures (including critical care time) DIAGNOSTIC STUDIES: Oxygen Saturation is 95% on room air, normal by my interpretation.    COORDINATION OF CARE:    MDM   Nursing notes and vitals signs, including pulse oximetry, reviewed.  Summary of this visit's results, reviewed by myself:  Labs:  Results for orders placed during the hospital encounter of 05/25/11  BASIC METABOLIC PANEL      Component Value Range   Sodium 141  135 - 145 (mEq/L)   Potassium 3.4 (*) 3.5 - 5.1 (mEq/L)   Chloride 102  96 - 112 (mEq/L)   CO2 28  19 - 32 (mEq/L)   Glucose, Bld 108 (*) 70 - 99 (mg/dL)   BUN 22  6 - 23 (mg/dL)   Creatinine, Ser 0.98  0.50 - 1.10 (mg/dL)   Calcium 9.6  8.4 - 11.9 (mg/dL)   GFR calc non Af Amer 60 (*) >90 (mL/min)   GFR calc Af Amer 70 (*) >90 (mL/min)  CBC      Component Value Range   WBC 9.8  4.0 - 10.5 (K/uL)   RBC 4.03  3.87 - 5.11 (MIL/uL)   Hemoglobin 13.3  12.0 - 15.0 (g/dL)   HCT 14.7  82.9 - 56.2 (%)   MCV 93.3  78.0 - 100.0 (fL)  MCH 33.0  26.0 - 34.0 (pg)   MCHC 35.4  30.0 -  36.0 (g/dL)   RDW 40.9  81.1 - 91.4 (%)   Platelets 221  150 - 400 (K/uL)  DIFFERENTIAL      Component Value Range   Neutrophils Relative 69  43 - 77 (%)   Neutro Abs 6.8  1.7 - 7.7 (K/uL)   Lymphocytes Relative 16  12 - 46 (%)   Lymphs Abs 1.6  0.7 - 4.0 (K/uL)   Monocytes Relative 10  3 - 12 (%)   Monocytes Absolute 1.0  0.1 - 1.0 (K/uL)   Eosinophils Relative 5  0 - 5 (%)   Eosinophils Absolute 0.5  0.0 - 0.7 (K/uL)   Basophils Relative 1  0 - 1 (%)   Basophils Absolute 0.1  0.0 - 0.1 (K/uL)    Imaging Studies: Ct Head Wo Contrast  05/26/2011  *RADIOLOGY REPORT*  Clinical Data: Double vision which lasted for 5 minutes, now resolved.  CT HEAD WITHOUT CONTRAST  Technique:  Contiguous axial images were obtained from the base of the skull through the vertex without contrast.  Comparison: None.  Findings: Ventricles and sulci appear symmetrical.  No mass effect or midline shift.  No abnormal extra-axial fluid collections.  Wallace Cullens- white matter junctions are distinct.  Basal cisterns are not effaced.  No evidence of acute intracranial hemorrhage.  Visualized paranasal sinuses and mastoid air cells are not opacified.  No depressed skull fractures.  IMPRESSION: No acute intracranial abnormalities.  Original Report Authenticated By: Marlon Pel, M.D.   EKG Interpretation:  Date & Time: 05/26/2011 12:29 AM  Rate: 72  Rhythm: normal sinus rhythm  QRS Axis: left  Intervals: normal  ST/T Wave abnormalities: normal  Conduction Disutrbances:none  Narrative Interpretation:   Old EKG Reviewed: Axis shift to left due to QRS change in lead II   Although the patient perceives that the diplopia was present in the left eye, unilateral diplopia is normally only associated with a lens displacement. The patient had no trauma to the left eye, no pain to the left eye and the symptoms resolved in several minutes. This is more consistent with a CNS TIA. It could be that the right eye was dominant with  the left eye lagging causing the patient perceives that the diplopia was in the left eye. We will proceed with workup for TIA.  I personally performed the services described in this documentation, which was scribed in my presence.  The recorded information has been reviewed and considered.         Cynthia Seamen, MD 05/26/11 7829  Cynthia Seamen, MD 05/26/11 5621

## 2011-05-26 NOTE — Progress Notes (Signed)
   CARE MANAGEMENT NOTE 05/26/2011  Patient:  Cynthia Peters, Cynthia Peters   Account Number:  [id number]  Date Initiated:  05/26/2011  Documentation initiated by:  Darlyne Russian  Subjective/Objective Assessment:   Patient admitted with TIA     Action/Plan:   Progression of care and discharge planning   Anticipated DC Date:  05/29/2011   Anticipated DC Plan:  HOME/SELF CARE      DC Planning Services  CM consult      Choice offered to / List presented to:             Status of service:   Medicare Important Message given?   (If response is "NO", the following Medicare IM given date fields will be blank) Date Medicare IM given:   Date Additional Medicare IM given:    Discharge Disposition:    Per UR Regulation:  Reviewed for med. necessity/level of care/duration of stay  If discussed at Long Length of Stay Meetings, dates discussed:    Comments:  4/22//2013  Darlyne Russian RN, CCM [phone]  Utilization reveiw completed.

## 2011-05-26 NOTE — ED Notes (Signed)
Patient transferred to bed. Her nurse is in the room, Patient awake, oriented.

## 2011-05-26 NOTE — Progress Notes (Signed)
PHARMACIST - PHYSICIAN COMMUNICATION  CONCERNING: P&T Medication Policy on Herbal Medications  DESCRIPTION:  This patient's order for:  Evening Primrose Oil  And Flaxseed   has been noted. This product(s) is classified as an "herbal" or natural product. Due to a lack of definitive safety studies or FDA approval, nonstandard manufacturing practices, plus the potential risk of unknown drug-drug interactions while on inpatient medications, the Pharmacy and Therapeutics Committee does not permit the use of "herbal" or natural products of this type within [facility].   RECOMMENDATION: The pharmacy department has rejected this order at this time and your patient has been informed of this safety policy. Please reevaluate patient's clinical condition at discharge and address if the herbal or natural product(s) should be resumed at that time.

## 2011-05-26 NOTE — Progress Notes (Addendum)
[facility] Progress Note  05/26/2011   Subjective: Pt reports no recurrence in symptoms of diplopia.  Pt was not able to have the MRI and will need conscious sedation later this afternoon to have it done.    Objective:  Vital signs in last 24 hours: Filed Vitals:   05/26/11 0206 05/26/11 0300 05/26/11 0400 05/26/11 0800  BP: 115/53 158/74 148/70 112/55  Pulse: 67 71 70 73  Temp:  98.9 F (37.2 C)  97.8 F (36.6 C)  TempSrc:      Resp: 16 14  17   Height:  5\' 4"  (1.626 m)    Weight:  71.5 kg (157 lb 10.1 oz)    SpO2: 98% 95% 100% 100%   Weight change:  No intake or output data in the 24 hours ending 05/26/11 1105 No results found for this basename: HGBA1C   Lab Results  Component Value Date   LDLCALC  Value: 126        Total Cholesterol/HDL:CHD Risk Coronary Heart Disease Risk Table                     Men   Women  1/2 Average Risk   3.4   3.3  Average Risk       5.0   4.4  2 X Average Risk   9.6   7.1  3 X Average Risk  23.4   11.0        Use the calculated Patient Ratio above and the CHD Risk Table to determine the patient's CHD Risk.        ATP III CLASSIFICATION (LDL):  <100     mg/dL   Optimal  098-119  mg/dL   Near or Above                    Optimal  130-159  mg/dL   Borderline  147-829  mg/dL   High  >562     mg/dL   Very High* 02/06/863   CREATININE 1.00 05/26/2011    Review of Systems As above, otherwise all reviewed and reported negative  Physical Exam General - awake, no distress, cooperative HEENT - NCAT, MMM Lungs - BBS, CTA CV - normal s1, s2 sounds Abd - soft, nondistended, no masses, nontender Ext - no C/C/E Neuro - nonfocal  Lab Results: Results for orders placed during the hospital encounter of 05/25/11 (from the past 24 hour(s))  BASIC METABOLIC PANEL     Status: Abnormal   Collection Time   05/26/11 12:33 AM      Component Value Range   Sodium 141  135 - 145 (mEq/L)   Potassium 3.4 (*) 3.5 - 5.1 (mEq/L)   Chloride 102  96 - 112 (mEq/L)   CO2 28  19 - 32 (mEq/L)   Glucose, Bld 108 (*) 70 - 99 (mg/dL)   BUN 22  6 - 23 (mg/dL)   Creatinine, Ser 7.84  0.50 - 1.10 (mg/dL)   Calcium 9.6  8.4 - 69.6 (mg/dL)   GFR calc non Af Amer 60 (*) >90 (mL/min)   GFR calc Af Amer 70 (*) >90 (mL/min)  CBC     Status: Normal   Collection Time   05/26/11 12:33 AM      Component Value Range   WBC 9.8  4.0 - 10.5 (K/uL)   RBC 4.03  3.87 - 5.11 (MIL/uL)   Hemoglobin 13.3  12.0 - 15.0 (g/dL)   HCT 29.5  28.4 - 13.2 (%)  MCV 93.3  78.0 - 100.0 (fL)   MCH 33.0  26.0 - 34.0 (pg)   MCHC 35.4  30.0 - 36.0 (g/dL)   RDW 57.8  46.9 - 62.9 (%)   Platelets 221  150 - 400 (K/uL)  DIFFERENTIAL     Status: Normal   Collection Time   05/26/11 12:33 AM      Component Value Range   Neutrophils Relative 69  43 - 77 (%)   Neutro Abs 6.8  1.7 - 7.7 (K/uL)   Lymphocytes Relative 16  12 - 46 (%)   Lymphs Abs 1.6  0.7 - 4.0 (K/uL)   Monocytes Relative 10  3 - 12 (%)   Monocytes Absolute 1.0  0.1 - 1.0 (K/uL)   Eosinophils Relative 5  0 - 5 (%)   Eosinophils Absolute 0.5  0.0 - 0.7 (K/uL)   Basophils Relative 1  0 - 1 (%)   Basophils Absolute 0.1  0.0 - 0.1 (K/uL)    Micro Results: No results found for this or any previous visit (from the past 240 hour(s)).  Medications:  Scheduled Meds:   . buPROPion  300 mg Oral Daily  . calcium-vitamin D  1 tablet Oral Q breakfast  . dicyclomine  10 mg Oral TID AC & HS  . famotidine  20 mg Oral BID  . Flora-Q  1 capsule Oral Daily  . hydrochlorothiazide  25 mg Oral Daily  . losartan  50 mg Oral Daily  . metoprolol succinate  50 mg Oral Daily  . mulitivitamin with minerals  1 tablet Oral Daily  . pantoprazole  40 mg Oral Q1200  . potassium chloride  20 mEq Oral Once  . potassium chloride  20 mEq Oral Daily  . simvastatin  20 mg Oral q1800  . sodium chloride  3 mL Intravenous Q12H  . DISCONTD: Evening Primrose Oil  2 capsule Oral Daily  . DISCONTD: Flaxseed (Linseed)  2 capsule Oral Daily  . DISCONTD:  omeprazole  20 mg Oral Daily   Continuous Infusions:   . sodium chloride 75 mL/hr at 05/26/11 0843  . DISCONTD: sodium chloride     PRN Meds:.acetaminophen, albuterol, LORazepam  Assessment/Plan: Diplopia - resolved now, concerning for TIA event.  Check MRI/MRA - check carotid dopplers, telemetry, lipid panel, pt will need to follow up with her opthalmologist at discharge for full eye exam.  She sees Dr. Hyacinth Meeker opthalmology.   She also has PCP with Dr. Benedetto [facility].   Will follow up results.  Home tomorrow if stable.     Hypokalemia - replacing p.o.   HTN - stable, controlled  Hyperlipidemia - check lipids, cont statin  palpatations /anxiety disorder-  Lorazepam prn.    LOS: 1 day   Jaimie Redditt 05/26/2011, 11:05 AM   Cleora Fleet, MD, CDE, FAAFP [facility] [place], [place]  [phone]

## 2011-05-26 NOTE — Progress Notes (Signed)
   CARE MANAGEMENT NOTE 05/26/2011  Patient:  Cynthia Peters, Cynthia Peters   Account Number:  [id number]  Date Initiated:  05/26/2011  Documentation initiated by:  Darlyne Russian  Subjective/Objective Assessment:   Patient admitted with TIA     Action/Plan:   Progression of care and discharge planning   Anticipated DC Date:  05/29/2011   Anticipated DC Plan:  HOME/SELF CARE      DC Planning Services  CM consult      Choice offered to / List presented to:             Status of service:   Medicare Important Message given?   (If response is "NO", the following Medicare IM given date fields will be blank) Date Medicare IM given:   Date Additional Medicare IM given:    Discharge Disposition:    Per UR Regulation:  Reviewed for med. necessity/level of care/duration of stay  If discussed at Long Length of Stay Meetings, dates discussed:    Comments:  05/26/11 Cynthia Boer, RN, BSN 1535 PT WAS ADMITTED WITH BLURRED VISION FROM HOME.  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  PT HAS NO DC NEEDS AT THIS TIME.  WILL F/U.  4/22//2013  Darlyne Russian RN, CCM [phone]  Utilization reveiw completed.

## 2011-05-26 NOTE — ED Notes (Signed)
In nurses station. Drowsy, wakes spontaneously. No pain. Fiance with her now and during test.

## 2011-05-27 DIAGNOSIS — G459 Transient cerebral ischemic attack, unspecified: Secondary | ICD-10-CM

## 2011-05-27 HISTORY — DX: Transient cerebral ischemic attack, unspecified: G45.9

## 2011-05-27 MED ORDER — STROKE: EARLY STAGES OF RECOVERY BOOK
Freq: Once | Status: AC
  Administered 2011-05-27: 09:00:00
  Filled 2011-05-27: qty 1

## 2011-05-27 MED ORDER — SIMVASTATIN 40 MG PO TABS: 40.0000 mg | ORAL_TABLET | Freq: Every day | ORAL | Status: DC

## 2011-05-27 MED ORDER — ASPIRIN 81 MG PO TBEC: 81.0000 mg | DELAYED_RELEASE_TABLET | Freq: Every day | ORAL | Status: AC

## 2011-05-27 MED ORDER — POTASSIUM CHLORIDE CRYS ER 10 MEQ PO TBCR: 10.0000 meq | EXTENDED_RELEASE_TABLET | Freq: Every day | ORAL | Status: DC

## 2011-05-27 NOTE — Discharge Instructions (Signed)
PLEASE EXCUSE PATIENT FOR WORK ABSENCE AS PATIENT WAS RECENTLY HOSPITALIZED.    PLEASE FOLLOW UP WITH YOUR OPTHALMOLOGIST IN 1-2 DAYS FOR FULL EYE EXAM  PLEASE FOLLOW UP WITH YOUR PRIMARY CARE PHYSICIAN IN 3 DAYS FOR FULL EXAM AND HOSPITALIZATION FOLLOW UP.    PLEASE HAVE YOUR PCP SEND FOR RECORDS FROM ALL TESTS AND LABS FROM HOSPITALIZATION BY SIGNING A RELEASE FORM WHEN YOU SEE YOUR PCP.      Diplopia  Double vision (diplopia) means that you are seeing two of everything. Diplopia usually occurs with both eyes open, and may be worse when looking in one particular direction. If both eyes must be open to see double, this is called binocular diplopia. If double images are seen in just one eye, this is called monocular diplopia.  CAUSES  Binocular Diplopia  Disorder affecting the muscles that move the eyes or the nerves that control those muscles.   Tumor or other mass pushing on an eye from beside or behind the eye.   Myasthenia gravis. This is a neuromuscular illness that causes the body's muscles to tire easily. The eye muscles and eyelid muscles become weak. The eyes do not track together well.   Grave's disease. This is an overactivity of the thyroid gland. This condition causes swelling of tissues around the eyes. This produces a bulging out of the eyeball.   Blowout fracture of the bone around the eye. The muscles of the eye socket are damaged. This often happens when the eye is hit with force.   Complications after certain surgeries, such as a lens implant after cataract surgery.   Fluid-filled mass (abscess) behind or beside the eye, infection, or abnormal connection between arteries and veins.  Sometimes, no cause is found. Monocular Diplopia  Problems with corrective lens or contacts.   Corneal abrasion, infection, severe injury, or bulging and irregularity of the corneal surface (keratoconus).   Irregularities of the pupil from drugs, severe injury, or other causes.    Problems involving the lens of the eye, such as opacities or cataracts.   Complications after certain surgeries, such as a lens implant after cataract surgery.   Retinal detachment or problems involving the blood vessels of the retina.  Sometimes, no cause is found. SYMPTOMS  Binocular Diplopia  When one eye is closed or covered, the double images disappear.  Monocular Diplopia  When the unaffected eye is closed or covered, the double images remain. The double images disappear when the affected eye is closed or covered.  DIAGNOSIS  A diagnosis is made during an eye exam. TREATMENT  Treatment depends on the cause or underlying disease.   Relief of double vision symptoms may be achieved by patching one eye or by using special glasses.   Surgery on the muscles of the eye may be needed.  SEEK IMMEDIATE MEDICAL CARE IF:   You see two images of a single object you are looking at, either with both eyes open or with just one eye open.  Document Released: 11/22/2003 Document Revised: 01/09/2011 Document Reviewed: 09/08/2007 [facility] Patient Information 2012 [place], [place].   Cholesterol Cholesterol is a type of fat. Your body needs a small amount of cholesterol, but too much can cause health problems. Certain problems include heart attacks, strokes, and not enough blood flow to your heart, brain, kidneys, or feet. You get cholesterol in 2 ways:  Naturally.   By eating certain foods.  HOME CARE  Eat a low-fat diet:   Eat less eggs, whole dairy  products (whole milk, cheese, and butter), fatty meats, and fried foods.   Eat more fruits, vegetables, whole-wheat breads, lean chicken, and fish.   Follow your exercise program as told by your doctor.   Keep your weight at a healthy level. Talk to your doctor about what is right for you.   Only take medicine as told by your doctor.   Get your cholesterol checked once a year or as told by your doctor.  MAKE SURE YOU:  Understand  these instructions.   Will watch your condition.   Will get help right away if you are not doing well or get worse.  Document Released: 04/18/2008 Document Revised: 01/09/2011 Document Reviewed: 04/18/2008 [facility] Patient Information 2012 [place], [place].   Stroke Prevention Some medical conditions and behaviors are associated with an increased chance of having a stroke. You may prevent a stroke by making healthy choices and managing medical conditions. Reduce your risk of having a stroke by:  Staying physically active. Get at least 30 minutes of activity on most or all days.   Not smoking. It may also be helpful to avoid exposure to secondhand smoke.   Limiting alcohol use. Moderate alcohol use is considered to be:   No more than 2 drinks per day for men.   No more than 1 drink per day for nonpregnant women.   Eating healthy foods.   Include 5 or more servings of fruits and vegetables a day.   Certain diets may be prescribed to address high blood pressure, high cholesterol, diabetes, or obesity.   Managing your cholesterol levels.   A low-saturated fat, low-trans fat, low-cholesterol, and high-fiber diet may control cholesterol levels.   Take any prescribed medicines to control cholesterol as directed by your caregiver.   Managing your diabetes.   A controlled-carbohydrate, controlled-sugar diet is recommended to manage diabetes.   Take any prescribed medicines to control diabetes as directed by your caregiver.   Controlling your high blood pressure (hypertension).   A low-salt (sodium), low-saturated fat, low-trans fat, and low-cholesterol diet is recommended to manage high blood pressure.   Take any prescribed medicines to control hypertension as directed by your caregiver.   Maintaining a healthy weight.   A reduced-calorie, low-sodium, low-saturated fat, low-trans fat, low-cholesterol diet is recommended to manage weight.   Stopping drug abuse.   Avoiding  birth control pills.   Talk to your caregiver about the risks of taking birth control pills if you are over 3 years old, smoke, get migraines, or have ever had a blood clot.   Getting evaluated for sleep disorders (sleep apnea).   Talk to your caregiver about getting a sleep evaluation if you snore a lot or have excessive sleepiness.   Taking medicines as directed by your caregiver.   For some people, aspirin or blood thinners (anticoagulants) are helpful in reducing the risk of forming abnormal blood clots that can lead to stroke. If you have the irregular heart rhythm of atrial fibrillation, you should be on a blood thinner unless there is a good reason you cannot take them.   Understand all your medicine instructions.  SEEK IMMEDIATE MEDICAL CARE IF:   You have sudden weakness or numbness of the face, arm, or leg, especially on one side of the body.   You have sudden confusion.   You have trouble speaking (aphasia) or understanding.   You have sudden trouble seeing in one or both eyes.   You have sudden trouble walking.   You have  dizziness.   You have a loss of balance or coordination.   You have a sudden, severe headache with no known cause.   You have new chest pain or an irregular heartbeat.  Any of these symptoms may represent a serious problem that is an emergency. Do not wait to see if the symptoms will go away. Get medical help right away. Call your local emergency services (911 in [place] Do not drive yourself to the hospital. Document Released: 02/28/2004 Document Revised: 01/09/2011 Document Reviewed: 09/09/2010 [facility] Patient Information 2012 [place], [place].  Hypertension As your heart beats, it forces blood through your arteries. This force is your blood pressure. If the pressure is too high, it is called hypertension (HTN) or high blood pressure. HTN is dangerous because you may have it and not know it. High blood pressure may mean that your heart has to work  harder to pump blood. Your arteries may be narrow or stiff. The extra work puts you at risk for heart disease, stroke, and other problems.  Blood pressure consists of two numbers, a higher number over a lower, 110/72, for example. It is stated as "110 over 72." The ideal is below 120 for the top number (systolic) and under 80 for the bottom (diastolic). Write down your blood pressure today. You should pay close attention to your blood pressure if you have certain conditions such as:  Heart failure.   Prior heart attack.   Diabetes   Chronic kidney disease.   Prior stroke.   Multiple risk factors for heart disease.  To see if you have HTN, your blood pressure should be measured while you are seated with your arm held at the level of the heart. It should be measured at least twice. A one-time elevated blood pressure reading (especially in the Emergency Department) does not mean that you need treatment. There may be conditions in which the blood pressure is different between your right and left arms. It is important to see your caregiver soon for a recheck. Most people have essential hypertension which means that there is not a specific cause. This type of high blood pressure may be lowered by changing lifestyle factors such as:  Stress.   Smoking.   Lack of exercise.   Excessive weight.   Drug/tobacco/alcohol use.   Eating less salt.  Most people do not have symptoms from high blood pressure until it has caused damage to the body. Effective treatment can often prevent, delay or reduce that damage. TREATMENT  When a cause has been identified, treatment for high blood pressure is directed at the cause. There are a large number of medications to treat HTN. These fall into several categories, and your caregiver will help you select the medicines that are best for you. Medications may have side effects. You should review side effects with your caregiver. If your blood pressure stays high  after you have made lifestyle changes or started on medicines,   Your medication(s) may need to be changed.   Other problems may need to be addressed.   Be certain you understand your prescriptions, and know how and when to take your medicine.   Be sure to follow up with your caregiver within the time frame advised (usually within two weeks) to have your blood pressure rechecked and to review your medications.   If you are taking more than one medicine to lower your blood pressure, make sure you know how and at what times they should be taken. Taking two medicines  at the same time can result in blood pressure that is too low.  SEEK IMMEDIATE MEDICAL CARE IF:  You develop a severe headache, blurred or changing vision, or confusion.   You have unusual weakness or numbness, or a faint feeling.   You have severe chest or abdominal pain, vomiting, or breathing problems.  MAKE SURE YOU:   Understand these instructions.   Will watch your condition.   Will get help right away if you are not doing well or get worse.  Document Released: 01/20/2005 Document Revised: 01/09/2011 Document Reviewed: 09/10/2007 [facility] Patient Information 2012 [place], [place]. STROKE/TIA DISCHARGE INSTRUCTIONS SMOKING Cigarette smoking nearly doubles your risk of having a stroke & is the single most alterable risk factor  If you smoke or have smoked in the last 12 months, you are advised to quit smoking for your health.  Most of the excess cardiovascular risk related to smoking disappears within a year of stopping.  Ask you doctor about anti-smoking medications  [place] Quit Line: 1-800-QUIT NOW  Free Smoking Cessation Classes [phone]  CHOLESTEROL Know your levels; limit fat & cholesterol in your diet  Lipid Panel     Component Value Date/Time   CHOL  Value: 198        ATP III CLASSIFICATION:  <200     mg/dL   Desirable  191-478  mg/dL   Borderline High  >=295    mg/dL   High        07/06/1306 0517   TRIG  112 06/03/2008 0517   HDL 50 06/03/2008 0517   CHOLHDL 4.0 06/03/2008 0517   VLDL 22 06/03/2008 0517   LDLCALC  Value: 126        Total Cholesterol/HDL:CHD Risk Coronary Heart Disease Risk Table                     Men   Women  1/2 Average Risk   3.4   3.3  Average Risk       5.0   4.4  2 X Average Risk   9.6   7.1  3 X Average Risk  23.4   11.0        Use the calculated Patient Ratio above and the CHD Risk Table to determine the patient's CHD Risk.        ATP III CLASSIFICATION (LDL):  <100     mg/dL   Optimal  657-846  mg/dL   Near or Above                    Optimal  130-159  mg/dL   Borderline  962-952  mg/dL   High  >841     mg/dL   Very High* 04/05/4399 0272      Many patients benefit from treatment even if their cholesterol is at goal.  Goal: Total Cholesterol (CHOL) less than 160  Goal:  Triglycerides (TRIG) less than 150  Goal:  HDL greater than 40  Goal:  LDL (LDLCALC) less than 100   BLOOD PRESSURE American Stroke Association blood pressure target is less that 120/80 mm/Hg  Your discharge blood pressure is:  BP: 148/70 mmHg  Monitor your blood pressure  Limit your salt and alcohol intake  Many individuals will require more than one medication for high blood pressure  DIABETES (A1c is a blood sugar average for last 3 months) Goal HGBA1c is under 7% (HBGA1c is blood sugar average for last 3 months)  Diabetes: No known diagnosis  of diabetes    No results found for this basename: HGBA1C     Your HGBA1c can be lowered with medications, healthy diet, and exercise.  Check your blood sugar as directed by your physician  Call your physician if you experience unexplained or low blood sugars.  PHYSICAL ACTIVITY/REHABILITATION Goal is 30 minutes at least 4 days per week    Return to work:  see discharge instructions  Activity decreases your risk of heart attack and stroke and makes your heart stronger.  It helps control your weight and blood pressure; helps you relax and can improve your  mood.  Participate in a regular exercise program.  Talk with your doctor about the best form of exercise for you (dancing, walking, swimming, cycling).  DIET/WEIGHT Goal is to maintain a healthy weight  Your discharge diet is: heart healthy Your height is:  Height: 5\' 4"  (162.6 cm) Your current weight is: Weight: 71.5 kg (157 lb 10.1 oz) Your Body Mass Index (BMI) is:  BMI (Calculated): 27.1   Following the type of diet specifically designed for you will help prevent another stroke.  Your goal weight range is:  111-140 lbs  Your goal Body Mass Index (BMI) is 19-24.  Healthy food habits can help reduce 3 risk factors for stroke:  High cholesterol, hypertension, and excess weight.  RESOURCES Stroke/Support Group:  Call [phone]  they meet the 3rd Sunday of the month on the Rehab Unit at [facility], [place] ( no meetings June, July & Aug).  STROKE EDUCATION PROVIDED/REVIEWED AND GIVEN TO PATIENT Stroke warning signs and symptoms How to activate emergency medical system (call 911). Medications prescribed at discharge. Need for follow-up after discharge. Personal risk factors for stroke. Pneumonia vaccine given:   No Flu vaccine given:   No My questions have been answered, the writing is legible, and I understand these instructions.  I will adhere to these goals & educational materials that have been provided to me after my discharge from the hospital.   Stroke Prevention Some medical conditions and behaviors are associated with an increased chance of having a stroke. You may prevent a stroke by making healthy choices and managing medical conditions. Reduce your risk of having a stroke by:  Staying physically active. Get at least 30 minutes of activity on most or all days.   Not smoking. It may also be helpful to avoid exposure to secondhand smoke.   Limiting alcohol use. Moderate alcohol use is considered to be:   No more than 2 drinks per day for men.   No more than 1 drink per day  for nonpregnant women.   Eating healthy foods.   Include 5 or more servings of fruits and vegetables a day.   Certain diets may be prescribed to address high blood pressure, high cholesterol, diabetes, or obesity.   Managing your cholesterol levels.   A low-saturated fat, low-trans fat, low-cholesterol, and high-fiber diet may control cholesterol levels.   Take any prescribed medicines to control cholesterol as directed by your caregiver.   Managing your diabetes.   A controlled-carbohydrate, controlled-sugar diet is recommended to manage diabetes.   Take any prescribed medicines to control diabetes as directed by your caregiver.   Controlling your high blood pressure (hypertension).   A low-salt (sodium), low-saturated fat, low-trans fat, and low-cholesterol diet is recommended to manage high blood pressure.   Take any prescribed medicines to control hypertension as directed by your caregiver.   Maintaining a healthy weight.   A reduced-calorie,  low-sodium, low-saturated fat, low-trans fat, low-cholesterol diet is recommended to manage weight.   Stopping drug abuse.   Avoiding birth control pills.   Talk to your caregiver about the risks of taking birth control pills if you are over 87 years old, smoke, get migraines, or have ever had a blood clot.   Getting evaluated for sleep disorders (sleep apnea).   Talk to your caregiver about getting a sleep evaluation if you snore a lot or have excessive sleepiness.   Taking medicines as directed by your caregiver.   For some people, aspirin or blood thinners (anticoagulants) are helpful in reducing the risk of forming abnormal blood clots that can lead to stroke. If you have the irregular heart rhythm of atrial fibrillation, you should be on a blood thinner unless there is a good reason you cannot take them.   Understand all your medicine instructions.  SEEK IMMEDIATE MEDICAL CARE IF:   You have sudden weakness or numbness of  the face, arm, or leg, especially on one side of the body.   You have sudden confusion.   You have trouble speaking (aphasia) or understanding.   You have sudden trouble seeing in one or both eyes.   You have sudden trouble walking.   You have dizziness.   You have a loss of balance or coordination.   You have a sudden, severe headache with no known cause.   You have new chest pain or an irregular heartbeat.  Any of these symptoms may represent a serious problem that is an emergency. Do not wait to see if the symptoms will go away. Get medical help right away. Call your local emergency services (911 in [place] Do not drive yourself to the hospital. Document Released: 02/28/2004 Document RTransient Ischemic Attack A transient ischemic attack (TIA) is a "warning stroke" that causes stroke-like symptoms. Unlike a stroke, a TIA does not cause permanent damage to the brain. The symptoms of a TIA can happen very fast and do not last long. It is important to know the symptoms of a TIA and what to do. This can help prevent a major stroke or death. CAUSES   A TIA is caused by a temporary blockage in an artery in the brain or neck (carotid artery). The blockage does not allow the brain to get the blood supply it needs and can cause different symptoms. The blockage can be caused by either:   A blood clot.   Fatty buildup (plaque) in a neck or brain artery.  SYMPTOMS  TIA symptoms are the same as a stroke but are temporary. Symptoms can include sudden:  Numbness or weakness on one side of the body. Especially to the:   Face.   Arm.   Leg.   Trouble speaking, thinking, or confusion.   Change in vision, such as trouble seeing in one or both eyes.   Dizziness, loss of balance, or difficulty walking.   Severe headache.  ANY OF THESE SYMPTOMS MAY REPRESENT A SERIOUS PROBLEM THAT IS AN EMERGENCY. Do not wait to see if the symptoms will go away. Get medical help at once. Call your local  emergency services (911 in [place] IMMEDIATELY. DO NOT drive yourself to the hospital. RISK FACTORS Risk factors can increase the risk of developing a TIA. These can include.   High blood pressure (hypertension).   High cholesterol (hyperlipidemia).   Heart disease (atherosclerosis).   Smoking.   Diabetes.   Abnormal heart rhythm (atrial fibrillation).   Family history of  a stroke or heart attack.   Use of oral contraceptives (especially when combined with smoking).  DIAGNOSIS   A TIA can be diagnosed based on your:   Symptoms.   History.   Risk factors.   Tests that can help diagnose the symptoms of a TIA include:   CT or MRI scan. These tests can provide detailed images of the brain.   Carotid ultrasound. This test looks to see if there are blockages in the carotid arteries of your neck.   Arteriography. A thin, small flexible tube (catheter) is inserted through a small cut (incision) in your groin. The catheter is threaded to your carotid or vertebral artery. A dye is then injected into the catheter. The dye highlights the arteries in your brain and allows your caregiver to look for narrowing or blockages that can cause a TIA.  TREATMENT  Based on the cause of a TIA, treatment options can vary. Treatment is important to help prevent a stroke. Treatment options can include:  Medication. Such as:   Clot-busting medicine.   Anti-platelet medicine.   Blood pressure medicine.   Blood thinner medicine.   Surgery:   Carotid endarterectomy. The carotid arteries are the arteries that supply the head and neck with oxygenated blood. This surgery can help remove fatty deposits (plaque) in the carotid arteries.   Angioplasty and stenting. This surgery uses a balloon to dilate a blocked artery in the brain. A stent is a small, metal mesh tube that can help keep an artery open  HOME CARE INSTRUCTIONS   It is important to take all medicine as told by your caregiver. If the  medicine has side effects that affect you negatively, tell your caregiver right away. Do not stop taking medicine unless told by your caregiver. Some medicines may need to be changed to better treat your condition.   Do not smoke. Talk to your caregiver on how to quit smoking.   Eat a diet high in fruits, vegetables and lean meat. Avoid a high fat, high salt diet. A dietician can you help you make healthy food choices.   Maintain a healthy weight. Develop an exercise plan approved by your caregiver.  SEEK IMMEDIATE MEDICAL CARE IF:   You develop weakness or numbness on one side of your body.   You have problems thinking, speaking, or feel confused.   You have vision changes.   You feel dizzy, have trouble walking, or lose your balance.   You develop a severe headache.  MAKE SURE YOU:   Understand these instructions.   Will watch your condition.   Will get help right away if you are not doing well or get worse.  Document Released: 10/30/2004 Document Revised: 01/09/2011 Document Reviewed: 03/15/2009 [employer] Patient Information 2012 [place], [place]: 01/09/2011 Document Reviewed: 09/09/2010 [facility] Patient Information 2012 [place], [place].

## 2011-05-27 NOTE — Progress Notes (Signed)
  Echocardiogram 2D Echocardiogram has been performed.  Cynthia Peters 05/27/2011, 8:49 AM

## 2011-05-27 NOTE — Discharge Summary (Signed)
HOSPITAL DISCHARGE SUMMARY   @n   Cynthia Peters, 61 y.o., DOB February 07, 1950  Admission date: 05/25/2011 Discharge Date: 05/27/2011  Primary MD Cynthia Hoit, MD, MD  Admitting Physician Cynthia Pray, MD  Admission Diagnosis  TIA (transient ischemic attack) [435.9] double vision  Discharge Diagnoses:   No resolved problems to display.  Active Hospital Problems  Diagnoses Date Noted   . TIA (transient ischemic attack) 05/27/2011   . Hypokalemia 05/26/2011   . PVC's (premature ventricular contractions)    . Palpitations    . Hypertension    . Hyperlipidemia      Resolved Hospital Problems  Diagnoses Date Noted Date Resolved    Past Medical History  Diagnosis Date  . Irregular heart beat   . Palpitations   . GERD (gastroesophageal reflux disease)   . Hyperlipidemia   . Hypertension   . TMJ arthralgia   . Bronchitis, acute   . Cystitis   . Joint pain   . PVC's (premature ventricular contractions)     Past Surgical History  Procedure Date  . Dilation and curettage of uterus   . Endometrial ablation   . Hernia repair   . Cesarean section     Significant Tests:  See full reports for all details    MRI HEAD  Findings: Secondary to claustrophobia, the patient was sedated.  Despite this, the exam is motion degraded.  No acute infarct.  No intracranial hemorrhage.  Scattered punctate nonspecific white matter type changes most  notable right frontal lobe. In this hypertensive hyperlipidemic  patient, findings most likely related to result of small vessel  disease.  Partially empty sella and minimally prominent perioptic spaces may  be incidental findings although also described in patients with  pseudotumor cerebri. Orbital structures otherwise unremarkable.  No intracranial mass lesion detected on this unenhanced exam.  IMPRESSION:  Motion degraded exam.  No acute infarct.  Punctate nonspecific white matter type changes probably related to  result of  small vessel disease.  Please see above.   MRA HEAD  Findings: Anterior circulation without medium or large size vessel  significant stenosis or occlusion.  Anterior cerebral artery and middle cerebral artery mild branch  vessel irregularity.  Right vertebral artery slightly dominant in size. Mild narrowing  of the distal right vertebral artery.  The full extent of the PICAs were not included on the present exam.  Nonvisualization AICAs.  Superior cerebellar artery mild to moderate narrowing.  Posterior cerebral artery mild to moderate branch vessel  irregularity.  No aneurysm or vascular malformation detected on this slightly  motion degraded exam.  IMPRESSION:  Branch vessel atherosclerotic type changes. Please see above.  Original Report Authenticated By: Cynthia Peters, M.D.  CAROTID DOPPLERS NECK - NO SIGNIFICANT ICA STENOSIS FOUND  ECHOCARDIOGRAM : PENDING AT TIME OF DISCHARGE.  PT INSTRUCTED TO GET HER PCP TO GET THE RESULTS ON FOLLOW UP.   Hospital Course See H&P, Labs, Consult and Test reports for all details. In brief, this patient was admitted for a brief episodes of diplopia that lasted about 3-5 mins.  It was concerning for a TIA event and pt was admitted to be worked up for possible TIA. CT was negative for acute.  Pt had an MRI/MRA under conscious sedation and it did not reveal any acute findings.  Pt also had telemetry and ECHO (results pending time of discharge).  No sig tele abnormalities seen.  Pt's LDL cholesterol was slightly suboptimal at 103 and recommend that her LDL goal  be less than 100 since we saw findings of small vessel disease and atherosclerosis on MRI/MRA.  The pt's simvastatin was increased to 40 mg.  The patient was started on aspirin 81mg  and given instructions to see her eye care specialist for full exam and opthalmology referral.  Pt also set to see her PCP for follow up in the next 3 days to get results of ECHO and follow up on BP and Hyperlipidemia.   Pt was hypokalemic and will be sent home on KCL 10 meq po daily.     No resolved problems to display.  Active Hospital Problems  Diagnoses Date Noted   . TIA (transient ischemic attack) 05/27/2011   . Hypokalemia 05/26/2011   . PVC's (premature ventricular contractions)    . Palpitations    . Hypertension    . Hyperlipidemia      Resolved Hospital Problems  Diagnoses Date Noted Date Resolved     Today's Assessment:   Subjective:   Cynthia Peters  Pt has had no recurrence of diplopia since the initial episode.  Results of tests reviewed with patient. Importance of close outpatient follow up was emphasized and pt verbalized understanding.    Objective:   Blood pressure 125/78, pulse 72, temperature 97.9 F (36.6 C), temperature source Oral, resp. rate 18, height 5\' 4"  (1.626 m), weight 71.5 kg (157 lb 10.1 oz), SpO2 100.00%.  Intake/Output Summary (Last 24 hours) at 05/27/11 1113 Last data filed at 05/26/11 1700  Gross per 24 hour  Intake 621.25 ml  Output      0 ml  Net 621.25 ml    Exam Gen - awake, alert, NAD Lungs - BBS CTA CV - normal s1, s2 sounds, regular Abd - soft, nondistended, nontender, no masses Ext - no CCE Neuro - nonfocal    Lab Results  Component Value Date   WBC 9.8 05/26/2011   HGB 13.3 05/26/2011   HCT 37.6 05/26/2011   PLT 221 05/26/2011   LYMPHOPCT 16 05/26/2011   MONOPCT 10 05/26/2011   EOSPCT 5 05/26/2011   BASOPCT 1 05/26/2011   CMP: Lab Results  Component Value Date   NA 141 05/26/2011   K 3.4* 05/26/2011   CL 102 05/26/2011   CO2 28 05/26/2011   BUN 22 05/26/2011   CREATININE 1.00 05/26/2011   PROT 6.2 06/24/2007   ALBUMIN 4.2 06/24/2007   BILITOT 1.4* 06/24/2007   ALKPHOS 59 06/24/2007   AST 25 06/24/2007   ALT 31 06/24/2007  .  Discharge Instructions     Please see DC AVS and typed instructions  DISCHARGE MEDICATION: Medication List  As of 05/27/2011 11:13 AM   TAKE these medications         acetaminophen 500 MG tablet    Commonly known as: TYLENOL   Take 1,000 mg by mouth every 6 (six) hours as needed. For pain      ALIGN PO   Take 1 capsule by mouth daily.      aspirin 81 MG EC tablet   Take 1 tablet (81 mg total) by mouth daily. Swallow whole.      buPROPion 300 MG 24 hr tablet   Commonly known as: WELLBUTRIN XL   Take 300 mg by mouth daily.      CALTRATE 600+D 600-400 MG-UNIT per tablet   Generic drug: Calcium Carbonate-Vitamin D   Take 1 tablet by mouth daily.      dicyclomine 10 MG capsule   Commonly known as: BENTYL  Take 10 mg by mouth 4 (four) times daily -  before meals and at bedtime. For IBS      etodolac 500 MG tablet   Commonly known as: LODINE   Take 500 mg by mouth daily as needed. For pain      Evening Primrose Oil 1000 MG Caps   Take 2 capsules by mouth daily.      Flaxseed (Linseed) 1000 MG Caps   Take 2 capsules by mouth daily.      hydrochlorothiazide 25 MG tablet   Commonly known as: HYDRODIURIL   Take 25 mg by mouth daily.      losartan 50 MG tablet   Commonly known as: COZAAR   Take 50 mg by mouth daily.      metoprolol succinate 50 MG 24 hr tablet   Commonly known as: TOPROL-XL   Take 50 mg by mouth daily.      mulitivitamin with minerals Tabs   Take 1 tablet by mouth daily.      potassium chloride 10 MEQ tablet   Commonly known as: K-DUR,KLOR-CON   Take 1 tablet (10 mEq total) by mouth daily.      simvastatin 40 MG tablet   Commonly known as: ZOCOR   Take 1 tablet (40 mg total) by mouth daily.      VENTOLIN HFA 108 (90 BASE) MCG/ACT inhaler   Generic drug: albuterol   Inhale 2 puffs into the lungs every 6 (six) hours as needed. For shortness of breath and wheezing           Disposition and Follow-up:  Follow-up Information    Follow up with Cynthia Hoit, MD in 3 days. [facility] follow up, BP check)    Contact information:   [address] [place] [address] [phone]       Follow up with MILLER,SALLY, OD in 1 day.  (Get a full eye exam and opthalmology referral )    Contact information:   [address] [place] [address] [phone]         The risks, benefits, and possible side effects of all treatments and tests were explained to the patient.  The patient verbalized understanding.  The importance of close follow up with the primary care medical provider was explained clearly to the patient.  The patient verbalized understanding.  The patient was given instructions to return if symptoms recur, worsen or new changes develop.  The patient verbalized understanding.   Cleora Fleet, MD, CDE, FAAFP [facility] [place], [place]   Total Time spent reviewing critical document, reviewing this patient's comprehensive hospitalization, arranging follow up and coordination of care, reviewing data and todays exam greater than 40 minutes.   Signed: Vaanya Peters 05/27/2011 11:13 AM

## 2011-05-27 NOTE — Progress Notes (Signed)
Pt discharged to home per MD order. Pt received all discharge instructions including medication information and follow-up appointments.  Pt alert and oriented at discharge with no complaints. NIH documented at discharge.  Efraim Kaufmann

## 2011-05-30 LAB — MYASTHENIA GRAVIS PANEL 2
Acety choline binding ab: <0.3 nmol/L
Acetylchol Block Ab: <15 % of inhibition (ref <15)
Acetylcholine Modulat Ab: 13 %

## 2011-09-04 ENCOUNTER — Other Ambulatory Visit: Payer: Self-pay | Admitting: Gastroenterology

## 2011-09-04 DIAGNOSIS — R109 Unspecified abdominal pain: Secondary | ICD-10-CM

## 2011-09-09 ENCOUNTER — Other Ambulatory Visit: Payer: Self-pay | Admitting: Physician Assistant

## 2011-09-09 DIAGNOSIS — R109 Unspecified abdominal pain: Secondary | ICD-10-CM

## 2011-09-10 ENCOUNTER — Ambulatory Visit
Admission: RE | Admit: 2011-09-10 | Discharge: 2011-09-10 | Disposition: A | Discharge status: Home / Self Care | Payer: BC Managed Care – PPO | Source: Ambulatory Visit | Attending: Physician Assistant | Admitting: Physician Assistant

## 2011-09-10 DIAGNOSIS — R109 Unspecified abdominal pain: Secondary | ICD-10-CM

## 2011-09-11 ENCOUNTER — Other Ambulatory Visit: Payer: BC Managed Care – PPO

## 2011-11-13 ENCOUNTER — Encounter: Payer: Self-pay | Admitting: Cardiology

## 2012-04-27 ENCOUNTER — Encounter: Payer: Self-pay | Admitting: Gynecology

## 2012-04-27 ENCOUNTER — Ambulatory Visit (INDEPENDENT_AMBULATORY_CARE_PROVIDER_SITE_OTHER): Payer: BC Managed Care – PPO | Admitting: Gynecology

## 2012-04-27 VITALS — BP 114/60 | Ht 64.0 in | Wt 162.0 lb

## 2012-04-27 DIAGNOSIS — L431 Bullous lichen planus: Secondary | ICD-10-CM | POA: Insufficient documentation

## 2012-04-27 DIAGNOSIS — L439 Lichen planus, unspecified: Secondary | ICD-10-CM

## 2012-04-27 NOTE — Progress Notes (Signed)
Subjective:     Patient ID: Cynthia Peters, female   DOB: 1950/10/26, 62 y.o.   MRN: [id number]  HPI Presents for follow up of resistant lichen planus.  Pt is completing a course of high dose prednisone with taper.  Pt reports overall feeling much better, mostly complains of her hemorrhoid but minimal symptoms.  Pt overall Is very pleased.  Questions regarding when to proceed with colonoscopy which is due, she has deferred due to recent LP flare.   Review of Systems  All other systems reviewed and are negative.       Objective:   Physical Exam  Constitutional: She appears well-developed and well-nourished.  Genitourinary:     Hymenal lesions healed, min erythema vaginal lesion resolved  Skin: Skin is warm and dry.  Psychiatric: She has a normal mood and affect. Her behavior is normal. Judgment and thought content normal.       Assessment:     Lichen planus responded to oral prednisione    Plan:     Pt has 5mg  left for am, will not apply steroids, keep dry, open to air as possible. Follow up 2 weeks Pt instructed to start temovate if begins to flare and follow up in office

## 2012-04-27 NOTE — Patient Instructions (Signed)
Keep area dry with powder, cotton underwear, open to air as possible

## 2012-05-17 ENCOUNTER — Ambulatory Visit: Payer: BC Managed Care – PPO | Admitting: Gynecology

## 2012-05-20 ENCOUNTER — Ambulatory Visit: Payer: BC Managed Care – PPO | Admitting: Gynecology

## 2012-05-24 ENCOUNTER — Ambulatory Visit (INDEPENDENT_AMBULATORY_CARE_PROVIDER_SITE_OTHER): Payer: BC Managed Care – PPO | Admitting: Gynecology

## 2012-05-24 ENCOUNTER — Ambulatory Visit: Payer: BC Managed Care – PPO | Admitting: Gynecology

## 2012-05-24 ENCOUNTER — Encounter: Payer: Self-pay | Admitting: Gynecology

## 2012-05-24 VITALS — BP 106/60 | HR 74 | Resp 12

## 2012-05-24 DIAGNOSIS — L439 Lichen planus, unspecified: Secondary | ICD-10-CM

## 2012-05-24 NOTE — Progress Notes (Signed)
Pt follows up after completing course of oral  Prednisone, Pt has used no topical steroids since completion of oral course, subjectively she feels well, she denies pain, burning or difficulty with bowel movement.    PE: General : no acute distress Pelvic exam: VULVA: normal appearing vulva with no masses, tenderness or lesions,  VAGINA: normal appearing vagina with normal color and discharge, no lesions noted at 2 o'clock near cervix, hymenal lesion healled CERVIX: normal appearing cervix without discharge or lesions. Perineum:  Lesions resolved, minimal discoloration only noted, no perirectal ulcerations Gluteal ulcer healled  Assessment: Ulcerative lichen planus- healled Plan:  Recommend keeping temovate available, no treatment for now, this was first acknowledged perineal outbreak since diagnosis 40y ago, pt instructed to be vigilant regarding flares. F/u prn

## 2012-06-09 ENCOUNTER — Telehealth: Payer: Self-pay | Admitting: Gynecology

## 2012-06-09 NOTE — Telephone Encounter (Signed)
Patient calling needing her cd back with test results. Has an upcoming appointment she will need it for please. She will need it by the end of the week.

## 2012-06-10 NOTE — Telephone Encounter (Signed)
Patient left message on answering machine during lunch she already obtained a new cd form [place]. CD is up front in patient pick up drawer.

## 2012-06-24 ENCOUNTER — Ambulatory Visit: Payer: BC Managed Care – PPO | Admitting: Gynecology

## 2012-06-28 ENCOUNTER — Emergency Department (HOSPITAL_BASED_OUTPATIENT_CLINIC_OR_DEPARTMENT_OTHER): Payer: BC Managed Care – PPO

## 2012-06-28 ENCOUNTER — Encounter (HOSPITAL_BASED_OUTPATIENT_CLINIC_OR_DEPARTMENT_OTHER): Payer: Self-pay | Admitting: *Deleted

## 2012-06-28 ENCOUNTER — Emergency Department (HOSPITAL_BASED_OUTPATIENT_CLINIC_OR_DEPARTMENT_OTHER)
Admission: EM | Admit: 2012-06-28 | Discharge: 2012-06-29 | Disposition: A | Discharge status: Home / Self Care | Payer: BC Managed Care – PPO | Attending: Emergency Medicine | Admitting: Emergency Medicine

## 2012-06-28 DIAGNOSIS — L439 Lichen planus, unspecified: Secondary | ICD-10-CM | POA: Insufficient documentation

## 2012-06-28 DIAGNOSIS — Z8739 Personal history of other diseases of the musculoskeletal system and connective tissue: Secondary | ICD-10-CM | POA: Insufficient documentation

## 2012-06-28 DIAGNOSIS — E785 Hyperlipidemia, unspecified: Secondary | ICD-10-CM | POA: Insufficient documentation

## 2012-06-28 DIAGNOSIS — N3090 Cystitis, unspecified without hematuria: Secondary | ICD-10-CM | POA: Insufficient documentation

## 2012-06-28 DIAGNOSIS — M2540 Effusion, unspecified joint: Secondary | ICD-10-CM | POA: Insufficient documentation

## 2012-06-28 DIAGNOSIS — M79609 Pain in unspecified limb: Secondary | ICD-10-CM | POA: Insufficient documentation

## 2012-06-28 DIAGNOSIS — M79672 Pain in left foot: Secondary | ICD-10-CM

## 2012-06-28 DIAGNOSIS — IMO0002 Reserved for concepts with insufficient information to code with codable children: Secondary | ICD-10-CM | POA: Insufficient documentation

## 2012-06-28 DIAGNOSIS — I10 Essential (primary) hypertension: Secondary | ICD-10-CM | POA: Insufficient documentation

## 2012-06-28 DIAGNOSIS — J209 Acute bronchitis, unspecified: Secondary | ICD-10-CM | POA: Insufficient documentation

## 2012-06-28 DIAGNOSIS — Z87891 Personal history of nicotine dependence: Secondary | ICD-10-CM | POA: Insufficient documentation

## 2012-06-28 DIAGNOSIS — K219 Gastro-esophageal reflux disease without esophagitis: Secondary | ICD-10-CM | POA: Insufficient documentation

## 2012-06-28 DIAGNOSIS — Z8679 Personal history of other diseases of the circulatory system: Secondary | ICD-10-CM | POA: Insufficient documentation

## 2012-06-28 DIAGNOSIS — Z79899 Other long term (current) drug therapy: Secondary | ICD-10-CM | POA: Insufficient documentation

## 2012-06-28 DIAGNOSIS — Z791 Long term (current) use of non-steroidal anti-inflammatories (NSAID): Secondary | ICD-10-CM | POA: Insufficient documentation

## 2012-06-28 DIAGNOSIS — IMO0001 Reserved for inherently not codable concepts without codable children: Secondary | ICD-10-CM | POA: Insufficient documentation

## 2012-06-28 LAB — URINALYSIS, ROUTINE W REFLEX MICROSCOPIC
Bilirubin Urine: NEGATIVE
Glucose, UA: NEGATIVE mg/dL
Hgb urine dipstick: NEGATIVE
Ketones, ur: NEGATIVE mg/dL
Nitrite: NEGATIVE
Protein, ur: NEGATIVE mg/dL
Specific Gravity, Urine: 1.01 (ref 1.005–1.030)
Urobilinogen, UA: 0.2 mg/dL (ref 0.0–1.0)
pH: 7 (ref 5.0–8.0)

## 2012-06-28 LAB — URINE MICROSCOPIC-ADD ON

## 2012-06-28 NOTE — ED Notes (Signed)
Pain in her left foot x 3 weeks. She has had a negative xray. She was getting relief with naprosyn then the pain came back 3 days ago.

## 2012-06-28 NOTE — ED Provider Notes (Signed)
History     CSN: [id number]  Arrival date & time 06/28/12  2211   First MD Initiated Contact with Patient 06/28/12 2217      Chief Complaint  Patient presents with  . Foot Pain    (Consider location/radiation/quality/duration/timing/severity/associated sxs/prior treatment) Patient is a 62 y.o. female presenting with lower extremity pain. The history is provided by the patient.  Foot Pain This is a new problem. Episode onset: 3 weeks. The problem occurs constantly. The problem has been gradually worsening. Associated symptoms include joint swelling and myalgias. Nothing aggravates the symptoms. She has tried nothing for the symptoms. The treatment provided mild relief.  Pt reports she has been having foot pain for 3 weeks.   Pt's MD thought pt had a stress fx.  Pt treated with naprosyn with some relief.   Pt reports pain has returned worsened.   Past Medical History  Diagnosis Date  . Irregular heart beat   . Palpitations   . GERD (gastroesophageal reflux disease)   . Hyperlipidemia   . Hypertension   . TMJ arthralgia   . Bronchitis, acute   . Cystitis   . Joint pain   . PVC's (premature ventricular contractions)   . Lichen planus bullosus     Past Surgical History  Procedure Laterality Date  . Dilation and curettage of uterus    . Endometrial ablation    . Hernia repair    . Cesarean section      Family History  Problem Relation Age of Onset  . Dementia Mother   . Leukemia Child     History  Substance Use Topics  . Smoking status: Former Games developer  . Smokeless tobacco: Never Used  . Alcohol Use: No     Comment: occasional    OB History   Grav Para Term Preterm Abortions TAB SAB Ect Mult Living   3 3 3       2       Review of Systems  Musculoskeletal: Positive for myalgias and joint swelling.  All other systems reviewed and are negative.    Allergies  Hydrocodone  Home Medications   Current Outpatient Rx  Name  Route  Sig  Dispense  Refill  .  acetaminophen (TYLENOL) 500 MG tablet   Oral   Take 1,000 mg by mouth every 6 (six) hours as needed. For pain          . albuterol (VENTOLIN HFA) 108 (90 BASE) MCG/ACT inhaler   Inhalation   Inhale 2 puffs into the lungs every 6 (six) hours as needed. For shortness of breath and wheezing         . buPROPion (WELLBUTRIN XL) 300 MG 24 hr tablet   Oral   Take 300 mg by mouth daily.           . Calcium Carbonate-Vitamin D (CALTRATE 600+D) 600-400 MG-UNIT per tablet   Oral   Take 1 tablet by mouth daily.           . clobetasol ointment (TEMOVATE) 0.05 %               . dicyclomine (BENTYL) 10 MG capsule   Oral   Take 10 mg by mouth 4 (four) times daily -  before meals and at bedtime. For IBS          . etodolac (LODINE) 500 MG tablet   Oral   Take 500 mg by mouth daily as needed. For pain         .  Evening Primrose Oil 1000 MG CAPS   Oral   Take 2 capsules by mouth daily.           . Flaxseed, Linseed, 1000 MG CAPS   Oral   Take 2 capsules by mouth daily.           . hydrochlorothiazide 25 MG tablet   Oral   Take 25 mg by mouth daily.           . hydrocortisone (ANUSOL-HC) 25 MG suppository               . lidocaine (XYLOCAINE) 2 % jelly               . losartan (COZAAR) 50 MG tablet   Oral   Take 50 mg by mouth daily.           . metoprolol (TOPROL-XL) 50 MG 24 hr tablet   Oral   Take 50 mg by mouth daily.           . Multiple Vitamin (MULITIVITAMIN WITH MINERALS) TABS   Oral   Take 1 tablet by mouth daily.           . naproxen (NAPROSYN) 500 MG tablet   Oral   Take 500 tablets by mouth as needed.         Marland Kitchen EXPIRED: potassium chloride SA (K-DUR,KLOR-CON) 10 MEQ tablet   Oral   Take 1 tablet (10 mEq total) by mouth daily.   30 tablet   0   . predniSONE (DELTASONE) 10 MG tablet               . Probiotic Product (ALIGN PO)   Oral   Take 1 capsule by mouth daily.          . simvastatin (ZOCOR) 40 MG tablet    Oral   Take 1 tablet (40 mg total) by mouth daily.   30 tablet   0     BP 154/78  Pulse 64  Temp(Src) 98 F (36.7 C) (Oral)  Resp 18  Wt 155 lb (70.308 kg)  BMI 26.59 kg/m2  SpO2 98%  Physical Exam  Nursing note and vitals reviewed. Constitutional: She is oriented to person, place, and time. She appears well-developed and well-nourished.  HENT:  Head: Normocephalic and atraumatic.  Musculoskeletal: She exhibits tenderness.  Swollen area left lateral foot, 5th metatarsal area,  Ns and nv intact  Neurological: She is alert and oriented to person, place, and time. She has normal reflexes.  Skin: Skin is warm.  Psychiatric: She has a normal mood and affect.    ED Course  Procedures (including critical care time)  Labs Reviewed  URINALYSIS, ROUTINE W REFLEX MICROSCOPIC - Abnormal; Notable for the following:    Leukocytes, UA TRACE (*)    All other components within normal limits  URINE MICROSCOPIC-ADD ON   Dg Foot Complete Left  06/28/2012   *RADIOLOGY REPORT*  Clinical Data: Lateral left foot pain.  LEFT FOOT - COMPLETE 3+ VIEW  Comparison: None.  Findings: There is no evidence of fracture or dislocation.  The joint spaces are preserved.  There is no evidence of talar subluxation; the subtalar joint is unremarkable in appearance.  A bipartite medial sesamoid of the first toe is incidentally seen.  Mild apparent widening of the distance between the bases of the first and second metatarsals may reflect remote Lisfranc injury; no definite acute fracture is seen, and there is no history to suggest acute injury.  No significant soft tissue abnormalities are seen.  IMPRESSION:  1.  No evidence of fracture or dislocation. 2.  Mild apparent widening of the distance between the bases of the first and second metatarsals may reflect remote Lisfranc injury; no definite acute fracture seen, and there is no history to suggest acute injury. 3.  Bipartite medial sesamoid of the first toe noted.    Original Report Authenticated By: Tonia Ghent, M.D.     1. Foot pain, left       MDM  Pt advised follow up with Dr. Lajoyce Corners for evaluation,   Crutches,  Ace and post op shoe        Elson Areas, PA-C 06/29/12 0007

## 2012-06-28 NOTE — ED Notes (Signed)
Pt asked if we could check her for a UTI while she was here. Has been having some burning with urination.

## 2012-06-29 ENCOUNTER — Telehealth: Payer: Self-pay | Admitting: Gynecology

## 2012-06-29 MED ORDER — NAPROXEN 500 MG PO TABS: 500.0000 mg | ORAL_TABLET | Freq: Two times a day (BID) | ORAL | Status: DC

## 2012-06-29 MED ORDER — OXYCODONE-ACETAMINOPHEN 5-325 MG PO TABS: 2.0000 | ORAL_TABLET | ORAL | Status: DC | PRN

## 2012-06-29 NOTE — Telephone Encounter (Signed)
Patient with a flare up of lichen planus. Request to see Dr. Farrel Gobble  Only ASAP. Offered appt. Tomorrow but states she has another appt. For a foot injury. Appt. Given for Dr. Farrel Gobble at 3:30pm on Thursday.

## 2012-06-29 NOTE — ED Provider Notes (Signed)
Medical screening examination/treatment/procedure(s) were performed by non-physician practitioner and as supervising physician I was immediately available for consultation/collaboration.   Denaya Horn B. Daejah Klebba, MD 06/29/12 1753 

## 2012-06-29 NOTE — Telephone Encounter (Signed)
Patient is having some problems "flare up". Patient is asking for an appointment tomorrow with Dr. Farrel Gobble if possible.

## 2012-07-01 ENCOUNTER — Ambulatory Visit (INDEPENDENT_AMBULATORY_CARE_PROVIDER_SITE_OTHER): Payer: BC Managed Care – PPO | Admitting: Gynecology

## 2012-07-01 ENCOUNTER — Ambulatory Visit: Payer: BC Managed Care – PPO | Admitting: Gynecology

## 2012-07-01 VITALS — BP 130/82 | Resp 16 | Wt 158.0 lb

## 2012-07-01 DIAGNOSIS — L439 Lichen planus, unspecified: Secondary | ICD-10-CM

## 2012-07-01 DIAGNOSIS — R300 Dysuria: Secondary | ICD-10-CM

## 2012-07-01 NOTE — Progress Notes (Signed)
Subjective:     Patient ID: Cynthia Peters, female   DOB: 1950/03/11, 62 y.o.   MRN: [id number]  HPI Comments: Pt reports tingling with void that began a few days ago but was intermittent, assoicated cramping but no dysruia or hematuria, fever or chills.  Pt denies change in bowels.  Pt seen at urgent care recently-reviewed her u/a-rare bacteria noted on micro.  Recently treated for resistant Lichen planus that took months to resolve, doesn't see lesions but wanted [place] to check    Review of Systems  Genitourinary: Negative for dysuria, frequency, hematuria and flank pain.  All other systems reviewed and are negative.       Objective:   Physical Exam  Constitutional: She is oriented to person, place, and time. She appears well-developed and well-nourished.  Genitourinary:     Neurological: She is alert and oriented to person, place, and time.  Pelvic: External genitalia: postmenopausal changes, lesion as noted above Vagina: pale, smooth, nontender, no lesions Cervix:  No lesions Uterus:  Mobile nontender Adnexa:  Nontender, no fullness Rectum: Hemorrhoids noted, none inflammed     Assessment:     Lichen planus-single site     Plan:     Will treat area BID, pt instructed deep in vulva Urine culture sent

## 2012-07-03 LAB — URINE CULTURE
Colony Count: NO GROWTH
Organism ID, Bacteria: NO GROWTH

## 2012-07-05 ENCOUNTER — Ambulatory Visit (INDEPENDENT_AMBULATORY_CARE_PROVIDER_SITE_OTHER): Payer: BC Managed Care – PPO | Admitting: Gynecology

## 2012-07-05 ENCOUNTER — Encounter: Payer: Self-pay | Admitting: Gynecology

## 2012-07-05 VITALS — BP 116/64 | Resp 12

## 2012-07-05 DIAGNOSIS — N898 Other specified noninflammatory disorders of vagina: Secondary | ICD-10-CM

## 2012-07-05 MED ORDER — FLUCONAZOLE 150 MG PO TABS
150.0000 mg | ORAL_TABLET | Freq: Once | ORAL | Status: DC
Start: 2012-07-05 — End: 2012-08-09

## 2012-07-06 LAB — POCT WET PREP (WET MOUNT): Clue Cells Wet Prep Whiff POC: NEGATIVE

## 2012-07-06 NOTE — Progress Notes (Signed)
Pt presents complaining of yellowish vaginal discharge without odor that began last night and has continued today.  Denies itching. Is using temvate on area of LP at hymen and reports pain has resolved.  PE:  General appearance: alert, cooperative and appears stated age Pelvic: cervix normal in appearance, external genitalia normal, no adnexal masses or tenderness, no cervical motion tenderness, urethra without abnormality or discharge, uterus normal size, shape, and consistency and vagina normal without discharge Postmenopausal changes to vagina. Yellowish color noted on q-tip   Wet prep done   Assessment: Vaginal discharge LP   Plan: Few yeast noted on WP-will treat as new sx Discussed LP and lichenoid reactions that can be seen from some of her medications-NSAIDS, Betablockers and HCTZ.  Pt has appt with PCP in upcoming weeks and will discuss possible change in antihypertensives to see if related to resistant LP outbreak she had earlier this year-sent info via mychart earlier in day Can stop temovate again Can consider treating vagina with boric acid once LP issue settled F/u prn

## 2012-07-07 ENCOUNTER — Telehealth: Payer: Self-pay | Admitting: *Deleted

## 2012-07-07 NOTE — Telephone Encounter (Signed)
Patient is on her way to PCP who referred her to our office.  She is unable to see the MyChart msg that Dr lathrop sent her and she was hoping to take that info to PCP. Read the info from dr lathrop's not to patient regarding possible connection between LP and NSAIDS, Betablockers, Ace inhibitors. Advised can have PCP call us if desired and Dr Farrel Gobble can discuss with them.

## 2012-08-05 ENCOUNTER — Other Ambulatory Visit: Payer: Self-pay | Admitting: Gastroenterology

## 2012-08-09 ENCOUNTER — Encounter (HOSPITAL_COMMUNITY): Payer: Self-pay | Admitting: *Deleted

## 2012-08-09 ENCOUNTER — Encounter (HOSPITAL_COMMUNITY): Payer: Self-pay | Admitting: Pharmacy Technician

## 2012-08-10 ENCOUNTER — Encounter (HOSPITAL_COMMUNITY)
Admission: RE | Disposition: A | Discharge status: Home / Self Care | Payer: Self-pay | Source: Ambulatory Visit | Attending: Gastroenterology

## 2012-08-10 ENCOUNTER — Ambulatory Visit (HOSPITAL_COMMUNITY)
Admission: RE | Admit: 2012-08-10 | Discharge: 2012-08-10 | Disposition: A | Discharge status: Home / Self Care | Payer: BC Managed Care – PPO | Source: Ambulatory Visit | Attending: Gastroenterology | Admitting: Gastroenterology

## 2012-08-10 ENCOUNTER — Ambulatory Visit (HOSPITAL_COMMUNITY): Payer: BC Managed Care – PPO | Admitting: Certified Registered Nurse Anesthetist

## 2012-08-10 ENCOUNTER — Encounter (HOSPITAL_COMMUNITY): Payer: Self-pay | Admitting: Certified Registered Nurse Anesthetist

## 2012-08-10 ENCOUNTER — Encounter (HOSPITAL_COMMUNITY): Payer: Self-pay | Admitting: *Deleted

## 2012-08-10 DIAGNOSIS — R141 Gas pain: Secondary | ICD-10-CM | POA: Insufficient documentation

## 2012-08-10 DIAGNOSIS — E7800 Pure hypercholesterolemia, unspecified: Secondary | ICD-10-CM | POA: Insufficient documentation

## 2012-08-10 DIAGNOSIS — R142 Eructation: Secondary | ICD-10-CM | POA: Insufficient documentation

## 2012-08-10 DIAGNOSIS — K219 Gastro-esophageal reflux disease without esophagitis: Secondary | ICD-10-CM | POA: Insufficient documentation

## 2012-08-10 DIAGNOSIS — I10 Essential (primary) hypertension: Secondary | ICD-10-CM | POA: Insufficient documentation

## 2012-08-10 HISTORY — DX: Other complications of anesthesia, initial encounter: T88.59XA

## 2012-08-10 HISTORY — DX: Adverse effect of unspecified anesthetic, initial encounter: T41.45XA

## 2012-08-10 HISTORY — PX: COLONOSCOPY WITH PROPOFOL: SHX5780

## 2012-08-10 SURGERY — COLONOSCOPY WITH PROPOFOL
Anesthesia: Monitor Anesthesia Care

## 2012-08-10 MED ORDER — MIDAZOLAM HCL 5 MG/5ML IJ SOLN
INTRAMUSCULAR | Status: DC | PRN
  Administered 2012-08-10: 2 mg via INTRAVENOUS

## 2012-08-10 MED ORDER — LACTATED RINGERS IV SOLN
INTRAVENOUS | Status: DC
  Administered 2012-08-10: 1000 mL via INTRAVENOUS

## 2012-08-10 MED ORDER — SODIUM CHLORIDE 0.9 % IV SOLN: INTRAVENOUS | Status: DC

## 2012-08-10 MED ORDER — PROPOFOL INFUSION 10 MG/ML OPTIME
INTRAVENOUS | Status: DC | PRN
  Administered 2012-08-10: 120 ug/kg/min via INTRAVENOUS

## 2012-08-10 MED ORDER — PROMETHAZINE HCL 25 MG/ML IJ SOLN: 6.2500 mg | INTRAMUSCULAR | Status: DC | PRN

## 2012-08-10 MED ORDER — KETAMINE HCL 10 MG/ML IJ SOLN
INTRAMUSCULAR | Status: DC | PRN
  Administered 2012-08-10: 20 mg via INTRAVENOUS

## 2012-08-10 SURGICAL SUPPLY — 21 items

## 2012-08-10 NOTE — Op Note (Signed)
Problem: Abdominal bloating and loose bowel movements rule out microscopic colitis.  Endoscopist: Danise Edge  Premedication: Propofol administered by anesthesia  Procedure: Diagnostic colonoscopy with random colon biopsies The patient was placed in the left lateral decubitus position. Anal inspection and digital rectal exam were normal . The Pentax pediatric colonoscope was introduced into the rectum and advanced to the cecum. A normal-appearing ileocecal valve and appendiceal orifice were identified. Colonic preparation for the exam today was good.  Rectum. Normal. Retroflex view of the distal rectum normal.  Sigmoid colon and descending colon. Normal.  Splenic flexure. Normal.  Transverse colon. Normal.  Hepatic flexure. Normal.  Ascending colon. Normal.  Cecum and ileocecal valve. Normal.  Biopsies: Random colonic biopsies were performed from the right colon and left colon to microscopic colitis  Assessment: Normal proctocolonoscopy to the cecum. Random colon biopsies to look for microscopic colitis pending.  Recommendation: Schedule repeat screening colonoscopy in 10 years.

## 2012-08-10 NOTE — Anesthesia Preprocedure Evaluation (Signed)
Anesthesia Evaluation  Patient identified by MRN, date of birth, ID band Patient awake    Reviewed: Allergy & Precautions, H&P , NPO status , Patient's Chart, lab work & pertinent test results  Airway Mallampati: II TM Distance: >3 FB Neck ROM: Full    Dental  (+) Caps   Pulmonary neg pulmonary ROS,  breath sounds clear to auscultation  Pulmonary exam normal       Cardiovascular hypertension, Pt. on medications Rhythm:Regular Rate:Normal     Neuro/Psych negative neurological ROS  negative psych ROS   GI/Hepatic Neg liver ROS, GERD-  ,  Endo/Other  negative endocrine ROS  Renal/GU negative Renal ROS  negative genitourinary   Musculoskeletal negative musculoskeletal ROS (+)   Abdominal   Peds  Hematology negative hematology ROS (+)   Anesthesia Other Findings   Reproductive/Obstetrics                           Anesthesia Physical Anesthesia Plan  ASA: II  Anesthesia Plan: MAC   Post-op Pain Management:    Induction: Intravenous  Airway Management Planned: Simple Face Mask  Additional Equipment:   Intra-op Plan:   Post-operative Plan:   Informed Consent: I have reviewed the patients History and Physical, chart, labs and discussed the procedure including the risks, benefits and alternatives for the proposed anesthesia with the patient or authorized representative who has indicated his/her understanding and acceptance.   Dental advisory given  Plan Discussed with: CRNA  Anesthesia Plan Comments:         Anesthesia Quick Evaluation

## 2012-08-10 NOTE — Transfer of Care (Signed)
Immediate Anesthesia Transfer of Care Note  Patient: Cynthia Peters  Procedure(s) Performed: Procedure(s): COLONOSCOPY WITH PROPOFOL (N/A)  Patient Location: Endoscopy PACU  Anesthesia Type:MAC  Level of Consciousness: awake and alert   Airway & Oxygen Therapy: Patient Spontanous Breathing and Patient connected to face mask oxygen  Post-op Assessment: Report given to PACU RN and Post -op Vital signs reviewed and stable  Post vital signs: Reviewed and stable  Complications: No apparent anesthesia complications

## 2012-08-10 NOTE — H&P (Signed)
  Problem: Abdominal bloating and loose bowel movements rule out microscopic colitis  History: The patient is a 62 year old female born 02/01/1948. On 07/23/2009, the patient underwent a normal incomplete screening colonoscopy to the ascending colon; the cecum could not be examined. An air-contrast barium enema was scheduled but never performed.  The patient has been experiencing postprandial abdominal bloating and frequent bowel movements unassociated with gastrointestinal bleeding.  The patient is scheduled to undergo a diagnostic colonoscopy.  Medication allergies: None  Past medical and surgical history: Hypertension. Hypercholesterolemia. Gastroesophageal reflux. Cesarean section. Uterine endometrial thermocoagulation.  Habits: The patient has never smoked cigarettes. She consumes alcohol in moderation.  Exam: The patient is alert and lying comfortably on the endoscopy stretcher. Abdomen is soft and nontender to palpation. Lungs are clear to auscultation. Cardiac exam reveals a regular rhythm.  Plan: Proceed with diagnostic colonoscopy and perform random colon biopsies to rule out microscopic colitis.

## 2012-08-11 ENCOUNTER — Encounter (HOSPITAL_COMMUNITY): Payer: Self-pay | Admitting: Gastroenterology

## 2012-08-11 LAB — POCT I-STAT 4, (NA,K, GLUC, HGB,HCT)
Glucose, Bld: 95 mg/dL (ref 70–99)
HCT: 45 % (ref 36.0–46.0)
Hemoglobin: 15.3 g/dL — ABNORMAL HIGH (ref 12.0–15.0)
Potassium: 5.8 mEq/L — ABNORMAL HIGH (ref 3.5–5.1)
Sodium: 142 mEq/L (ref 135–145)

## 2012-08-11 NOTE — Anesthesia Postprocedure Evaluation (Signed)
Anesthesia Post Note  Patient: Cynthia Peters  Procedure(s) Performed: Procedure(s) (LRB): COLONOSCOPY WITH PROPOFOL (N/A)  Anesthesia type: MAC  Patient location: PACU  Post pain: Pain level controlled  Post assessment: Post-op Vital signs reviewed  Last Vitals:  Filed Vitals:   08/10/12 1425  BP: 147/82  Pulse:   Temp:   Resp: 14    Post vital signs: Reviewed  Level of consciousness: sedated  Complications: No apparent anesthesia complications

## 2012-08-13 ENCOUNTER — Encounter: Payer: Self-pay | Admitting: Gynecology

## 2012-08-13 ENCOUNTER — Ambulatory Visit (INDEPENDENT_AMBULATORY_CARE_PROVIDER_SITE_OTHER): Payer: BC Managed Care – PPO | Admitting: Gynecology

## 2012-08-13 VITALS — BP 112/72 | HR 68 | Resp 18 | Ht 64.0 in | Wt 154.0 lb

## 2012-08-13 DIAGNOSIS — L439 Lichen planus, unspecified: Secondary | ICD-10-CM

## 2012-08-13 NOTE — Progress Notes (Signed)
Subjective:     Patient ID: Cynthia Peters, female   DOB: [birth year], 62 y.o.   MRN: [id number]  HPI Comments: Pt here for follow up of LP, using temovate once a day since noticed a raw area near rectum.  Pt recently had colonoscopy 3d ago and wonders if prep irritated area. Pt had restarted temovate after but was not using before that. Pt had seen PCP regarding the possible lichenoid reaction from a few of her medications- no change in BP rx done due to well control, will watch regarding outbreaks.  Pt is going to [place] for a week and wanted to be checked before leaving    Review of Systems  Genitourinary: Negative for vaginal bleeding, vaginal discharge, vaginal pain and pelvic pain.  All other systems reviewed and are negative.       Objective:   Physical Exam  Constitutional: She is oriented to person, place, and time. She appears well-developed and well-nourished.  Genitourinary:     Neurological: She is alert and oriented to person, place, and time.  Skin: Skin is warm and dry.  vagina without lesions, pale, atrophic     Assessment:     Lichen planus     Plan:     recommend restarting  Temovate twice a day to areas above for 1w RTO if symptoms progress or fail to improve

## 2012-08-29 ENCOUNTER — Other Ambulatory Visit: Payer: Self-pay | Admitting: Obstetrics and Gynecology

## 2012-08-30 NOTE — Telephone Encounter (Signed)
Please advise Dr. Farrel Gobble, pt was seen 08/13/12 no rx was given/sent.

## 2012-09-03 ENCOUNTER — Ambulatory Visit (INDEPENDENT_AMBULATORY_CARE_PROVIDER_SITE_OTHER): Payer: BC Managed Care – PPO | Admitting: Gynecology

## 2012-09-03 ENCOUNTER — Encounter: Payer: Self-pay | Admitting: Gynecology

## 2012-09-03 VITALS — BP 140/86 | HR 72 | Ht 64.0 in | Wt 158.0 lb

## 2012-09-03 DIAGNOSIS — L439 Lichen planus, unspecified: Secondary | ICD-10-CM

## 2012-09-03 NOTE — Progress Notes (Signed)
Subjective:     Patient ID: Cynthia Peters, female   DOB: 1950/10/27, 62 y.o.   MRN: [id number]  HPI Comments: Pt here for f/u, reports having some new areas of irritation noted and she has started using the temovate there as well.  Pt has been stressed as she was recently diagnosed with a foot fracture and is wearing a boot.    Review of Systems  Constitutional: Negative.   Gastrointestinal: Positive for abdominal distention. Negative for abdominal pain, diarrhea, constipation and rectal pain.  Genitourinary: Negative.        Objective:   Physical Exam  Constitutional: She is oriented to person, place, and time. She appears well-developed and well-nourished.  Abdominal: Soft. She exhibits no distension. There is no tenderness.  Genitourinary: Vagina normal.    No erythema around the vagina.  Neurological: She is alert and oriented to person, place, and time.  no vaginal lesions noted     Assessment:     Lichen sclerosis     Plan:     Increase temovate to TID, f/u 1w Reprint list of meds that might have lichenoid reaction to review with PCP

## 2012-09-10 ENCOUNTER — Ambulatory Visit: Payer: BC Managed Care – PPO | Admitting: Gynecology

## 2012-09-13 ENCOUNTER — Ambulatory Visit (INDEPENDENT_AMBULATORY_CARE_PROVIDER_SITE_OTHER): Payer: BC Managed Care – PPO | Admitting: Gynecology

## 2012-09-13 VITALS — BP 120/72 | HR 74 | Resp 12 | Ht 64.0 in | Wt 154.0 lb

## 2012-09-13 DIAGNOSIS — L439 Lichen planus, unspecified: Secondary | ICD-10-CM

## 2012-09-13 NOTE — Progress Notes (Signed)
Subjective:     Patient ID: Cynthia Peters, female   DOB: [birth year], 62 y.o.   MRN: [id number]  HPI Comments: Pt here for follow up, using temovate TID for 1w, feels like everything is getting better, has done sitz baths    Review of Systems  All other systems reviewed and are negative.       Objective:   Physical Exam  Constitutional: She is oriented to person, place, and time. She appears well-developed and well-nourished.  Abdominal: Soft. She exhibits no distension. There is no tenderness.  Genitourinary: Vagina normal.    No erythema around the vagina.  Neurological: She is alert and oriented to person, place, and time.       Assessment:     Lichen planus, improved except at hymenal area      Plan:     Pt missed hymenal area this past week, will treat there TID, decrease other areas to BID, F/u 1w

## 2012-09-20 ENCOUNTER — Ambulatory Visit (INDEPENDENT_AMBULATORY_CARE_PROVIDER_SITE_OTHER): Payer: BC Managed Care – PPO | Admitting: Gynecology

## 2012-09-20 VITALS — BP 112/80 | HR 80 | Resp 14 | Ht 64.0 in | Wt 157.0 lb

## 2012-09-20 DIAGNOSIS — L439 Lichen planus, unspecified: Secondary | ICD-10-CM

## 2012-09-20 MED ORDER — LIDOCAINE 5 % EX OINT: TOPICAL_OINTMENT | Freq: Three times a day (TID) | CUTANEOUS | Status: DC

## 2012-09-20 NOTE — Patient Instructions (Addendum)
Keep area of folliculitis dry, blot, blow dry, try aveeno bath in sitz bath Xylocaine as needed Apply steroid externally sparingly once a day.  Try to avoid contact with other skin

## 2012-09-20 NOTE — Progress Notes (Signed)
Subjective:     Patient ID: Cynthia Peters, female   DOB: 07/29/1950, 62 y.o.   MRN: [id number]  HPI Comments: Follow up of LP, using temovate TID at hymen and BID elsewhere.  Pt is off of school due to fractured foot, so on disability for 39m.  Pt is still on medications that might give lichenoid reaction-metoprolol and HCTZ..     Review of Systems  All other systems reviewed and are negative.       Objective:   Physical Exam  Nursing note and vitals reviewed. Constitutional: She is oriented to person, place, and time. She appears well-developed and well-nourished.  Abdominal: Soft. She exhibits no distension. There is no tenderness.  Genitourinary: Vagina normal.    No erythema around the vagina.  Neurological: She is alert and oriented to person, place, and time.       Assessment:     Lichen planus    Plan:     Doing well overall, will get compounded vaginal steroid for vagina-seems to  Not be responding  F/u 2w

## 2012-09-21 ENCOUNTER — Telehealth: Payer: Self-pay | Admitting: *Deleted

## 2012-09-21 NOTE — Telephone Encounter (Signed)
Clobetasol .5 % intravaginally at bedtime  vaginal suppositories #14/0rf's called into [facility], pt is aware.

## 2012-09-27 ENCOUNTER — Telehealth: Payer: Self-pay | Admitting: Gynecology

## 2012-09-27 ENCOUNTER — Ambulatory Visit (INDEPENDENT_AMBULATORY_CARE_PROVIDER_SITE_OTHER): Payer: BC Managed Care – PPO | Admitting: Gynecology

## 2012-09-27 VITALS — BP 114/70 | HR 72 | Temp 98.3°F | Resp 16 | Ht 64.0 in | Wt 159.0 lb

## 2012-09-27 DIAGNOSIS — L439 Lichen planus, unspecified: Secondary | ICD-10-CM

## 2012-09-27 DIAGNOSIS — R300 Dysuria: Secondary | ICD-10-CM

## 2012-09-27 DIAGNOSIS — N3000 Acute cystitis without hematuria: Secondary | ICD-10-CM

## 2012-09-27 LAB — POCT URINALYSIS DIPSTICK
Blood, UA: 2
Urobilinogen, UA: NEGATIVE
pH, UA: 5

## 2012-09-27 MED ORDER — CIPROFLOXACIN HCL 500 MG PO TABS: 500.0000 mg | ORAL_TABLET | Freq: Two times a day (BID) | ORAL | Status: DC

## 2012-09-27 MED ORDER — PHENAZOPYRIDINE HCL 200 MG PO TABS: 200.0000 mg | ORAL_TABLET | Freq: Three times a day (TID) | ORAL | Status: DC | PRN

## 2012-09-27 NOTE — Telephone Encounter (Signed)
Patient having bleeding and is post menopausal. Also patient thinks she has a urinary tract infection. Please advise.

## 2012-09-27 NOTE — Progress Notes (Signed)
  Subjective:    Cynthia Peters is a 62 y.o. female who complains of burning with urination, pain in the left flank and suprapubic pressure. She has had symptoms for 1 day. Patient also complains of back pain. Patient denies vaginal discharge. Patient does have a history of recurrent UTI. Patient does not have a history of pyelonephritis. Pt denies history of kidney stones  The following portions of the patient's history were reviewed and updated as appropriate: allergies, current medications, past family history, past medical history, past social history, past surgical history and problem list.  Review of Systems Pertinent items are noted in HPI.    Objective:    General appearance: alert, cooperative and appears stated age Back: symmetric, no curvature. ROM normal. No CVA tenderness. Abdomen: soft, non-tender; bowel sounds normal; no masses,  no organomegaly Pelvic: cervix normal in appearance and see picture Extremities: extremities normal, atraumatic, no cyanosis or edema    Laboratory:  Urine dipstick: 2+ for hemoglobin, negative for ketones, 2+ for leukocyte esterase and trace for nitrites.     Assessment:    Acute cystitis and lichen planus     Plan:    Medications: ciprofloxacin. Maintain adequate hydration. increase temovate to affected areas, consider removing betablocker and thiazide  Consider oral steroids again

## 2012-09-27 NOTE — Patient Instructions (Signed)
Place urinary tract infection patient instructions here.

## 2012-09-27 NOTE — Telephone Encounter (Signed)
Spoke with patient not sure if she has a UTI,  having some bleeding with urination. Being treated for  Lichen Planus using clobetasol .5% vaginal supp.Marland Kitchen appt scheduled for patient to come in today.

## 2012-09-28 LAB — URINALYSIS
Glucose, UA: NEGATIVE mg/dL
Ketones, ur: 15 mg/dL — AB
Nitrite: POSITIVE — AB
Protein, ur: 100 mg/dL — AB
Specific Gravity, Urine: 1.023 (ref 1.005–1.030)
Urobilinogen, UA: 1 mg/dL (ref 0.0–1.0)
pH: 7 (ref 5.0–8.0)

## 2012-09-30 LAB — URINE CULTURE: Colony Count: >=100000

## 2012-10-06 ENCOUNTER — Ambulatory Visit (INDEPENDENT_AMBULATORY_CARE_PROVIDER_SITE_OTHER): Payer: BC Managed Care – PPO | Admitting: Gynecology

## 2012-10-06 ENCOUNTER — Encounter: Payer: Self-pay | Admitting: Gynecology

## 2012-10-06 VITALS — BP 130/76 | HR 60 | Ht 64.0 in | Wt 156.5 lb

## 2012-10-06 DIAGNOSIS — K649 Unspecified hemorrhoids: Secondary | ICD-10-CM

## 2012-10-06 DIAGNOSIS — K648 Other hemorrhoids: Secondary | ICD-10-CM

## 2012-10-06 DIAGNOSIS — N3000 Acute cystitis without hematuria: Secondary | ICD-10-CM

## 2012-10-06 DIAGNOSIS — L439 Lichen planus, unspecified: Secondary | ICD-10-CM

## 2012-10-06 MED ORDER — HYDROCORTISONE ACETATE 25 MG RE SUPP: 25.0000 mg | Freq: Two times a day (BID) | RECTAL | Status: DC

## 2012-10-06 MED ORDER — ZOLPIDEM TARTRATE 10 MG PO TABS: 5.0000 mg | ORAL_TABLET | Freq: Every evening | ORAL | Status: DC | PRN

## 2012-10-06 MED ORDER — METHYLPREDNISOLONE 4 MG PO KIT: PACK | ORAL | Status: DC

## 2012-10-06 NOTE — Progress Notes (Signed)
Pt here for follow up of lichen planus, she has not seen her PCP regarding her beta blocker and thiazide.  Pt reports using the temovate twice a day but reports a rawness when she sits.  Pt also reports some discomfort from her hemorrhoid.  In addition, she was recently treated for UTI and reports that she is having some burning with void, no fever or chills.  ROS:  Per HPI PE: afeb, VSS Gen: no acute distress Alert and oriented     hemorrhoid blue   Vagina:  No lesions Skin:  No lesions  A:  Lichen planus  Hemorrhoid Recent cystitis for test of cure  P:  Pt appears to not be responding to steroids, suspect may be related to lichenoid reaction from her medications-strongly recommend change in rx Will begin oral steroids again-medrol dose pack sent in, will rto in 1w to assess response. Will confirm with PCP and podiatrist regarding oral steroids and medication changes

## 2012-10-07 LAB — URINE CULTURE
Colony Count: NO GROWTH
Organism ID, Bacteria: NO GROWTH

## 2012-10-08 ENCOUNTER — Encounter: Payer: Self-pay | Admitting: Gynecology

## 2012-10-13 ENCOUNTER — Other Ambulatory Visit: Payer: Self-pay | Admitting: Gynecology

## 2012-10-13 ENCOUNTER — Ambulatory Visit (INDEPENDENT_AMBULATORY_CARE_PROVIDER_SITE_OTHER): Payer: BC Managed Care – PPO | Admitting: Gynecology

## 2012-10-13 ENCOUNTER — Ambulatory Visit: Payer: BC Managed Care – PPO | Admitting: Gynecology

## 2012-10-13 VITALS — BP 130/78 | Temp 98.4°F | Resp 14 | Ht 64.0 in | Wt 156.0 lb

## 2012-10-13 DIAGNOSIS — L439 Lichen planus, unspecified: Secondary | ICD-10-CM

## 2012-10-13 DIAGNOSIS — R300 Dysuria: Secondary | ICD-10-CM

## 2012-10-13 DIAGNOSIS — B370 Candidal stomatitis: Secondary | ICD-10-CM

## 2012-10-13 LAB — POCT URINALYSIS DIPSTICK
Blood, UA: 0
Urobilinogen, UA: NEGATIVE
pH, UA: 8.5

## 2012-10-13 MED ORDER — CLOBETASOL PROPIONATE 0.05 % EX OINT: TOPICAL_OINTMENT | CUTANEOUS | Status: DC

## 2012-10-13 MED ORDER — NYSTATIN 100000 UNIT/ML MT SUSP: 500000.0000 [IU] | Freq: Four times a day (QID) | OROMUCOSAL | Status: DC

## 2012-10-13 MED ORDER — PHENAZOPYRIDINE HCL 200 MG PO TABS: 200.0000 mg | ORAL_TABLET | Freq: Three times a day (TID) | ORAL | Status: DC | PRN

## 2012-10-13 NOTE — Progress Notes (Signed)
Subjective:     Patient ID: Cynthia Peters, female   DOB: 1950/12/05, 62 y.o.   MRN: [id number]  HPI Comments: Pt just saw podiatrist who ok'd starting steroid dose pack but had not picked up yet.  Pt is using steroids to affected areas BID, she feels the rawness when she voids.  Pt is concerned re starting the steroids now as she will be out of town for the next several days.      Review of Systems  Constitutional: Negative for fever and chills.  Genitourinary: Positive for dysuria.       Objective:   Physical Exam  Nursing note and vitals reviewed. Constitutional: She is oriented to person, place, and time. She appears well-developed and well-nourished.  Abdominal: Soft. She exhibits no distension. There is no tenderness.  Genitourinary: Vagina normal.    No erythema around the vagina.  Neurological: She is alert and oriented to person, place, and time.       Assessment:     Lichen planus     Plan:     Will continue topical steriods, consider intralesional injection to shallow ulcers Will defer oral steroids as pt is leaving town  Check urine culture

## 2012-10-13 NOTE — Telephone Encounter (Signed)
Per Dr. Farrel Gobble okay to send in Clobetasol 0.05% ointment #60 gm / 2 rf's sent through to pharmacy, LM on patient's vm regarding this.

## 2012-10-13 NOTE — Telephone Encounter (Signed)
Please advise patient was just seen today no rx was given?

## 2012-10-13 NOTE — Telephone Encounter (Signed)
Patient calling requesting refill about clobetsol ointment. Wanted to get asap going out of town in an hour

## 2012-10-15 LAB — URINE CULTURE: Colony Count: 30000

## 2012-10-18 ENCOUNTER — Telehealth: Payer: Self-pay | Admitting: Gynecology

## 2012-10-18 NOTE — Telephone Encounter (Signed)
Patient calling about a medication for a bacteria infection needed to be called in got a message on Friday but just seen it today wants medication called into  [facility] [place]

## 2012-10-18 NOTE — Telephone Encounter (Signed)
Patient called in to let us know she did not get the My Chart message while in [place]. Requests RX to [facility] [place].   Took Cipro for last UTI Allergic to hydrocodone. Instructed to be sure to finish all medication.

## 2012-10-19 ENCOUNTER — Other Ambulatory Visit: Payer: Self-pay | Admitting: Orthopedic Surgery

## 2012-10-19 MED ORDER — CEPHALEXIN 500 MG PO CAPS: 500.0000 mg | ORAL_CAPSULE | Freq: Four times a day (QID) | ORAL | Status: AC

## 2012-10-19 MED ORDER — FLUCONAZOLE 150 MG PO TABS: 150.0000 mg | ORAL_TABLET | Freq: Once | ORAL | Status: AC

## 2012-10-19 NOTE — Telephone Encounter (Signed)
Spoke with pt about Rx to be sent for Keflex and diflucan. Pt requests [facility] [place]. Pt appreciative.

## 2012-10-19 NOTE — Telephone Encounter (Signed)
Patient requests RX to [facility] [place].  I am not sure what you wanted to prescribe so I have not sent RX.  Let me know if I need to send anything.

## 2012-10-19 NOTE — Telephone Encounter (Signed)
Patient called about medication she was supposed to get wasn't sure if it was going to get called in today or not.

## 2012-10-19 NOTE — Telephone Encounter (Signed)
i sent her a message thru mychart, ican't find it, but she still has a low level uti-since she is having symptoms, i recommend treating with keflex 500mg  QID, and i offered her a diflucan, i needed a pharmacy # in [place] since she was there, but now it can be sent locally

## 2012-10-22 ENCOUNTER — Encounter: Payer: Self-pay | Admitting: Gynecology

## 2012-10-22 ENCOUNTER — Ambulatory Visit (INDEPENDENT_AMBULATORY_CARE_PROVIDER_SITE_OTHER): Payer: BC Managed Care – PPO | Admitting: Gynecology

## 2012-10-22 VITALS — BP 112/80 | HR 78 | Resp 12 | Ht 64.0 in | Wt 158.0 lb

## 2012-10-22 DIAGNOSIS — R300 Dysuria: Secondary | ICD-10-CM

## 2012-10-22 DIAGNOSIS — L439 Lichen planus, unspecified: Secondary | ICD-10-CM

## 2012-10-22 NOTE — Progress Notes (Signed)
Subjective:     Patient ID: Cynthia Peters, female   DOB: 21-Oct-1950, 62 y.o.   MRN: [id number]  HPI Comments: Pt here for follow up, she had been in [facility] with family and has not yet started her antibiotics for the recent UTI, she has been using the temovate but has discomfort when she sits, burning    Review of Systems  All other systems reviewed and are negative.       Objective:   Physical Exam  Nursing note and vitals reviewed. Constitutional: She is oriented to person, place, and time. She appears well-developed and well-nourished.  Abdominal: Soft. She exhibits no distension. There is no tenderness.  Genitourinary: Vagina normal.    No erythema around the vagina.  Neurological: She is alert and oriented to person, place, and time.       Assessment:     lichen planus-no change UTI IBS     Plan:     We will refer to dermatology regarding possible intralesional injections in the perirectal area Spoke to [facility] in [place] regarding stopping the betablcker and hctz and they are agreeable and will contact the pt Will start antibiotics and come in for test of cure, pt to call if antibiotics affect her IBS

## 2012-10-27 ENCOUNTER — Ambulatory Visit (INDEPENDENT_AMBULATORY_CARE_PROVIDER_SITE_OTHER): Payer: BC Managed Care – PPO | Admitting: Gynecology

## 2012-10-27 VITALS — BP 110/60 | HR 72 | Resp 12 | Ht 64.0 in | Wt 160.0 lb

## 2012-10-27 DIAGNOSIS — R300 Dysuria: Secondary | ICD-10-CM

## 2012-10-27 LAB — POCT URINALYSIS DIPSTICK
Urobilinogen, UA: NEGATIVE
pH, UA: 5

## 2012-10-29 LAB — URINE CULTURE: Colony Count: 30000

## 2012-11-01 ENCOUNTER — Encounter: Payer: Self-pay | Admitting: Gynecology

## 2012-11-09 ENCOUNTER — Encounter: Payer: Self-pay | Admitting: Podiatry

## 2012-11-09 ENCOUNTER — Ambulatory Visit (INDEPENDENT_AMBULATORY_CARE_PROVIDER_SITE_OTHER): Payer: BC Managed Care – PPO | Admitting: Podiatry

## 2012-11-09 ENCOUNTER — Ambulatory Visit (INDEPENDENT_AMBULATORY_CARE_PROVIDER_SITE_OTHER): Payer: BC Managed Care – PPO

## 2012-11-09 VITALS — BP 124/85 | HR 85 | Temp 97.6°F | Resp 16 | Ht 64.0 in | Wt 153.0 lb

## 2012-11-09 DIAGNOSIS — S8290XD Unspecified fracture of unspecified lower leg, subsequent encounter for closed fracture with routine healing: Secondary | ICD-10-CM

## 2012-11-09 DIAGNOSIS — S92302D Fracture of unspecified metatarsal bone(s), left foot, subsequent encounter for fracture with routine healing: Secondary | ICD-10-CM

## 2012-11-09 NOTE — Progress Notes (Signed)
Mrs. Ballester presents today for followup of her Jones fracture to the fifth metatarsal of the left foot. He states that as of late has been a little more painful. She has been wearing her Cam Dan Humphreys a regular basis. Is also wearing her postop surgical shoe. She is continuing to use her bone stimulator twice daily.  Objective: Vital signs are stable she is alert and oriented x3. Pulses are palpable bilateral. There is no calf pain. She has some swelling to the lateral aspect of the fifth metatarsal of the left foot and minimally so. Radiographic evaluation demonstrates well healing Jones fracture fifth metatarsal of the left foot.  Assessment: Well-healing Jones fracture left.  Plan: Continue the use of the Cam Walker and the bone stimulator for another 4 weeks. She will remain out of work for another 4 weeks. I will followup with her in 4 weeks which him another set of x-rays we performed to evaluate her progress at that time.

## 2012-11-09 NOTE — Patient Instructions (Signed)
Continue bone stimulator and boot for 4 more weeks and continue out of work for that time.

## 2012-11-12 ENCOUNTER — Other Ambulatory Visit: Payer: Self-pay | Admitting: Gynecology

## 2012-11-12 ENCOUNTER — Encounter: Payer: Self-pay | Admitting: Gynecology

## 2012-11-12 NOTE — Telephone Encounter (Signed)
eScribe request for refill on AMBIEN 10 MG Last filled - 10/06/12, #20 X 0 Last Office Visit - 10/27/12 Please advise refills.

## 2012-11-12 NOTE — Telephone Encounter (Signed)
RX faxed

## 2012-11-13 ENCOUNTER — Encounter: Payer: Self-pay | Admitting: Gynecology

## 2012-11-16 ENCOUNTER — Telehealth: Payer: Self-pay | Admitting: Gynecology

## 2012-11-16 NOTE — Telephone Encounter (Signed)
Dr. Farrel Gobble, do you need a consult or office visit for this patient?

## 2012-11-16 NOTE — Telephone Encounter (Signed)
Patient calling saying she needs to schedule a consult with [place] about the appt she had with a dermatologist. Not sure how to schedule it

## 2012-11-19 NOTE — Telephone Encounter (Signed)
Scheduled patient for last visit of the day on Tuesday 10/21 for OV/Consult.

## 2012-11-23 ENCOUNTER — Ambulatory Visit (INDEPENDENT_AMBULATORY_CARE_PROVIDER_SITE_OTHER): Payer: BC Managed Care – PPO | Admitting: Gynecology

## 2012-11-23 ENCOUNTER — Encounter: Payer: Self-pay | Admitting: Gynecology

## 2012-11-23 VITALS — BP 114/78 | HR 78 | Resp 18 | Ht 64.0 in | Wt 159.0 lb

## 2012-11-23 DIAGNOSIS — L439 Lichen planus, unspecified: Secondary | ICD-10-CM

## 2012-11-23 NOTE — Progress Notes (Signed)
Subjective:     Patient ID: Cynthia Peters, female   DOB: 1950/04/11, 62 y.o.   MRN: [id number]  HPI Comments: Pt here for follow up, had a biopsy of the peri-rectal lesion and perineal lesions.  Biopsy c/w pickers papules on rectum and spongiosa elsewhere, she has since stopped the steroids about 2w ago.  Is using only vaseline.  Is suing sitz baths and aveeno with good resuls.  Never stopped betablocker    Review of Systems  All other systems reviewed and are negative.       Objective:   Physical Exam  Constitutional: She is oriented to person, place, and time. She appears well-developed and well-nourished.  Genitourinary: Vagina normal.     Neurological: She is alert and oriented to person, place, and time.       Assessment:     Picker's papules dermatitis     Plan:     Discussed results, seems to be responding to stopping steroids, will continue sitz baths and aveeno, if continues to improve f/u 68m sooner if reverts.

## 2012-11-26 ENCOUNTER — Telehealth: Payer: Self-pay | Admitting: *Deleted

## 2012-11-26 NOTE — Telephone Encounter (Signed)
Pt states Dr Al Corpus has been treating her for a broken foot since Summer of 2014 and is on Medical Leave.  Medical Leave has been extended to 12/24/2012 and will x-ray again 12/21/2012 to see if need to extend leave again.  Pt states she has since decided to take retirement the beginning of January 2015.  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  Left message informing pt that I would send a electronic message to Dr Al Corpus and the Medical Leave representative.

## 2012-11-29 NOTE — Telephone Encounter (Signed)
Left message to call concerning medical leave.

## 2012-11-29 NOTE — Telephone Encounter (Signed)
Message copied by Marissa Nestle on Mon Nov 29, 2012 11:26 AM ------      Message from: Ernestene Kiel T      Created: Mon Nov 29, 2012  9:47 AM       I agree but it would be better medical legally to wait until we see him again. ------

## 2012-11-29 NOTE — Telephone Encounter (Signed)
See message from Dr Al Corpus, this was conveyed to pt.

## 2012-12-08 ENCOUNTER — Encounter: Payer: Self-pay | Admitting: Gynecology

## 2012-12-09 ENCOUNTER — Other Ambulatory Visit: Payer: Self-pay

## 2012-12-13 ENCOUNTER — Telehealth: Payer: Self-pay | Admitting: Gynecology

## 2012-12-13 ENCOUNTER — Ambulatory Visit (INDEPENDENT_AMBULATORY_CARE_PROVIDER_SITE_OTHER): Payer: BC Managed Care – PPO | Admitting: Podiatry

## 2012-12-13 ENCOUNTER — Encounter: Payer: Self-pay | Admitting: *Deleted

## 2012-12-13 ENCOUNTER — Ambulatory Visit (INDEPENDENT_AMBULATORY_CARE_PROVIDER_SITE_OTHER): Payer: BC Managed Care – PPO

## 2012-12-13 ENCOUNTER — Encounter: Payer: Self-pay | Admitting: Podiatry

## 2012-12-13 VITALS — BP 147/75 | HR 80 | Resp 16 | Ht 64.0 in | Wt 158.0 lb

## 2012-12-13 DIAGNOSIS — M79672 Pain in left foot: Secondary | ICD-10-CM

## 2012-12-13 DIAGNOSIS — M79609 Pain in unspecified limb: Secondary | ICD-10-CM

## 2012-12-13 DIAGNOSIS — IMO0002 Reserved for concepts with insufficient information to code with codable children: Secondary | ICD-10-CM

## 2012-12-13 DIAGNOSIS — S92302K Fracture of unspecified metatarsal bone(s), left foot, subsequent encounter for fracture with nonunion: Secondary | ICD-10-CM

## 2012-12-13 NOTE — Telephone Encounter (Signed)
Pt would like to come in before Wednesday she has to go out of town. Pt states she has been talking back and forth with Dr Farrel Gobble by e-mail and was told to call and talk with the nurse if she needed to come in.

## 2012-12-13 NOTE — Progress Notes (Signed)
Cynthia Peters was seen in the [place] office today for followup of her Jones fracture fifth metatarsal of the left foot. She confirms that she's been utilizing her bone stimulator at least once a day. She states she's been her Cam Walker and has not been walking without. States it seems to be hurting a little bit more a she refers to the fifth metatarsal Jones fracture.  Objective: I have reviewed her past history medications and allergies. Pulses are palpable left foot. Much decrease in edema and ecchymosis to the left foot. However the pain is more substantial. Radiographically it appears to be only minimally improved since last visit. Obviously a nonunion still exists.  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  Plan: We discussed the etiology pathology conservative versus surgical therapies. We also discussed her workability with this foot. At this point I will see house possible that she'll be able to go back and perform her daily duties at her job until this has resolved 100%. A slow is is resolving at this point in the still only approximately 50% better than I estimate another 3-4 months of healing. I hope it's quicker but I doubt it.  I will followup with her in 6 weeks for another set of x-rays. Until then we will continue her out of work at least to the first border of the next year. Burnett Harry will fill out the paperwork for her disability.

## 2012-12-13 NOTE — Telephone Encounter (Signed)
Message left to return call to Xzavian Semmel at [phone].    

## 2012-12-17 NOTE — Telephone Encounter (Signed)
Spoke with Son, states patient is out of town.

## 2012-12-21 ENCOUNTER — Ambulatory Visit: Payer: BC Managed Care – PPO | Admitting: Podiatry

## 2012-12-22 ENCOUNTER — Encounter: Payer: Self-pay | Admitting: Gynecology

## 2012-12-22 ENCOUNTER — Ambulatory Visit (INDEPENDENT_AMBULATORY_CARE_PROVIDER_SITE_OTHER): Payer: BC Managed Care – PPO | Admitting: Gynecology

## 2012-12-22 VITALS — BP 112/74 | HR 98 | Resp 18 | Ht 64.0 in | Wt 162.0 lb

## 2012-12-22 DIAGNOSIS — L439 Lichen planus, unspecified: Secondary | ICD-10-CM

## 2012-12-22 DIAGNOSIS — R300 Dysuria: Secondary | ICD-10-CM

## 2012-12-22 NOTE — Progress Notes (Signed)
Subjective:     Patient ID: Cynthia Peters, female   DOB: December 10, 1950, 62 y.o.   MRN: [id number]  HPI Comments: Pt reports stress level has been improving, she is still out of work due to her foot and now has decided to retire after her disability.  Pt reports some dysuria, burning and some "pink" with void. She has stayed off of temovate but still taking betablocker and diuretic     Review of Systems  Genitourinary: Negative for vaginal discharge, genital sores and vaginal pain.  Skin: Negative for rash and wound.       Objective:   Physical Exam  Nursing note and vitals reviewed. Constitutional: She is oriented to person, place, and time. She appears well-developed and well-nourished.  Abdominal: Soft. She exhibits no distension. There is no tenderness.  Genitourinary: Vagina normal.    No erythema around the vagina.  Neurological: She is alert and oriented to person, place, and time.       Assessment:     Lichen planus improving dysuria     Plan:     Improving with less stress, ok to continue betablocker and diuretic Check u/a and culutre F/u prn

## 2012-12-23 LAB — URINALYSIS, MICROSCOPIC ONLY
Bacteria, UA: NONE SEEN
Casts: NONE SEEN
Crystals: NONE SEEN
Squamous Epithelial / LPF: NONE SEEN

## 2012-12-23 LAB — URINE CULTURE
Colony Count: NO GROWTH
Organism ID, Bacteria: NO GROWTH

## 2012-12-24 ENCOUNTER — Telehealth: Payer: Self-pay | Admitting: *Deleted

## 2012-12-24 NOTE — Telephone Encounter (Signed)
Per Dr. Farrel Gobble call patient and see if Cynthia Peters would like to be referred to a urologist.  S/w patient Cynthia Peters said if Dr. Farrel Gobble thinks Cynthia Peters needs her to be then Cynthia Peters'll do whatever Cynthia Peters wants her to.  Also notified her of her negative urine culture.  Please advise

## 2012-12-26 NOTE — Telephone Encounter (Signed)
Call [facility] re referral for hematuria, no infection, no one specific. (d/w Jazz on Friday, not sure if done)

## 2012-12-28 NOTE — Telephone Encounter (Signed)
Spoke with pt about appt at [facility] with Dr. Berneice Heinrich 01-11-13 at 1:45. Pt agreeable.

## 2012-12-28 NOTE — Telephone Encounter (Signed)
Patient calling to check on status of referral to a urologist.

## 2013-01-03 ENCOUNTER — Other Ambulatory Visit: Payer: Self-pay | Admitting: Gynecology

## 2013-01-03 NOTE — Telephone Encounter (Signed)
Patient has appointment scheduled but she wanted to know if she could call [facility] and try to get earlier appointment. She wanted to know if Dr. Farrel Gobble would like her to see Dr. Berneice Heinrich specifically or if first available is okay? When calling patient back, can leave detailed message on mobile.

## 2013-01-03 NOTE — Telephone Encounter (Signed)
Rx was sent 11/12/12 #20/0 refills.  Please advise.

## 2013-01-03 NOTE — Telephone Encounter (Signed)
Spoke with patient. She will try to get earlier appointment.

## 2013-01-03 NOTE — Telephone Encounter (Signed)
She can see anyone there so first avail is fine

## 2013-01-03 NOTE — Telephone Encounter (Signed)
Ok to call in

## 2013-01-04 NOTE — Telephone Encounter (Signed)
Ambien 10 mg #20/[facility], patient notified and aware.

## 2013-01-25 ENCOUNTER — Encounter: Payer: Self-pay | Admitting: Podiatry

## 2013-01-25 ENCOUNTER — Ambulatory Visit (INDEPENDENT_AMBULATORY_CARE_PROVIDER_SITE_OTHER): Payer: BC Managed Care – PPO | Admitting: Podiatry

## 2013-01-25 ENCOUNTER — Ambulatory Visit (INDEPENDENT_AMBULATORY_CARE_PROVIDER_SITE_OTHER): Payer: BC Managed Care – PPO

## 2013-01-25 DIAGNOSIS — S92309A Fracture of unspecified metatarsal bone(s), unspecified foot, initial encounter for closed fracture: Secondary | ICD-10-CM

## 2013-01-25 DIAGNOSIS — M79609 Pain in unspecified limb: Secondary | ICD-10-CM

## 2013-01-25 NOTE — Progress Notes (Signed)
Pt states the foot is better, still wearing the boot, some tender to the touch or pressure as she refers to the fifth metatarsal of the left foot. She continues to use her bone stimulator.  Objective: Vital signs are stable she is alert and oriented x3. She has minimal tenderness on palpation of the fifth metatarsal of the left foot. The left foot demonstrates no erythema edema saline is drainage or odor. She presents walking in her in a Cam Walker. Radiographic evaluation demonstrates near 100% consolidation of the fracture site.  Assessment: Jones fracture 75% healed per patient 90% healed her radiographs I expect 100% resolution the next him I see her.  Plan: I suggested she try to start getting back into regular shoe gear before we send her back to work. I will followup with her in 4-6 weeks for what is I hope her last x-ray.

## 2013-02-02 NOTE — Progress Notes (Signed)
Patient ID: Cynthia Peters, female   DOB: 02-19-1950, 62 y.o.   MRN: [id number] .

## 2013-02-07 ENCOUNTER — Other Ambulatory Visit: Payer: Self-pay | Admitting: Gynecology

## 2013-02-07 NOTE — Telephone Encounter (Signed)
Please sign if ok to refill. Was last refilled on 01/03/13 for #20/0RF.

## 2013-02-08 NOTE — Telephone Encounter (Signed)
Left Message To Call Back  

## 2013-02-09 ENCOUNTER — Other Ambulatory Visit: Payer: Self-pay | Admitting: Urology

## 2013-02-10 NOTE — Telephone Encounter (Signed)
Patient is calling Cynthia Peters back °

## 2013-02-10 NOTE — Telephone Encounter (Signed)
I spoke to [place] at [facility] and this request was an automatic refill.  Pt still has 01/03/13 refill on file.  Cynthia Peters will process for patient. I spoke to [place] and informed her that refill has been processed.  She will pick up when she needs.

## 2013-02-11 ENCOUNTER — Encounter (HOSPITAL_BASED_OUTPATIENT_CLINIC_OR_DEPARTMENT_OTHER): Payer: Self-pay | Admitting: *Deleted

## 2013-02-14 ENCOUNTER — Encounter (HOSPITAL_BASED_OUTPATIENT_CLINIC_OR_DEPARTMENT_OTHER): Payer: Self-pay | Admitting: *Deleted

## 2013-02-14 NOTE — Progress Notes (Signed)
NPO AFTER MN. ARRIVE AT 1000. NEEDS ISTAT AND EKG. WILL TAKE TOPROL, COZAAR, PRILOSEC, WELLBUTRIN, AND ZOCOR AM DOS W/ SIPS OF WATER.

## 2013-02-18 ENCOUNTER — Ambulatory Visit (HOSPITAL_BASED_OUTPATIENT_CLINIC_OR_DEPARTMENT_OTHER)
Admission: RE | Admit: 2013-02-18 | Discharge: 2013-02-18 | Disposition: A | Discharge status: Home / Self Care | Payer: BC Managed Care – PPO | Source: Ambulatory Visit | Attending: Urology | Admitting: Urology

## 2013-02-18 ENCOUNTER — Encounter (HOSPITAL_BASED_OUTPATIENT_CLINIC_OR_DEPARTMENT_OTHER)
Admission: RE | Disposition: A | Discharge status: Home / Self Care | Payer: Self-pay | Source: Ambulatory Visit | Attending: Urology

## 2013-02-18 ENCOUNTER — Ambulatory Visit (HOSPITAL_BASED_OUTPATIENT_CLINIC_OR_DEPARTMENT_OTHER): Payer: BC Managed Care – PPO | Admitting: Anesthesiology

## 2013-02-18 ENCOUNTER — Encounter (HOSPITAL_BASED_OUTPATIENT_CLINIC_OR_DEPARTMENT_OTHER): Payer: Self-pay | Admitting: *Deleted

## 2013-02-18 ENCOUNTER — Encounter (HOSPITAL_BASED_OUTPATIENT_CLINIC_OR_DEPARTMENT_OTHER): Payer: BC Managed Care – PPO | Admitting: Anesthesiology

## 2013-02-18 DIAGNOSIS — R300 Dysuria: Secondary | ICD-10-CM | POA: Insufficient documentation

## 2013-02-18 DIAGNOSIS — R350 Frequency of micturition: Secondary | ICD-10-CM | POA: Insufficient documentation

## 2013-02-18 DIAGNOSIS — Z87891 Personal history of nicotine dependence: Secondary | ICD-10-CM | POA: Insufficient documentation

## 2013-02-18 DIAGNOSIS — N952 Postmenopausal atrophic vaginitis: Secondary | ICD-10-CM | POA: Insufficient documentation

## 2013-02-18 DIAGNOSIS — R310 Gross hematuria: Secondary | ICD-10-CM | POA: Insufficient documentation

## 2013-02-18 DIAGNOSIS — F411 Generalized anxiety disorder: Secondary | ICD-10-CM | POA: Insufficient documentation

## 2013-02-18 DIAGNOSIS — J45909 Unspecified asthma, uncomplicated: Secondary | ICD-10-CM | POA: Insufficient documentation

## 2013-02-18 DIAGNOSIS — E785 Hyperlipidemia, unspecified: Secondary | ICD-10-CM | POA: Insufficient documentation

## 2013-02-18 DIAGNOSIS — I10 Essential (primary) hypertension: Secondary | ICD-10-CM | POA: Insufficient documentation

## 2013-02-18 DIAGNOSIS — L439 Lichen planus, unspecified: Secondary | ICD-10-CM | POA: Insufficient documentation

## 2013-02-18 DIAGNOSIS — N35919 Unspecified urethral stricture, male, unspecified site: Secondary | ICD-10-CM | POA: Insufficient documentation

## 2013-02-18 DIAGNOSIS — K219 Gastro-esophageal reflux disease without esophagitis: Secondary | ICD-10-CM | POA: Insufficient documentation

## 2013-02-18 DIAGNOSIS — R3915 Urgency of urination: Secondary | ICD-10-CM | POA: Insufficient documentation

## 2013-02-18 DIAGNOSIS — K589 Irritable bowel syndrome without diarrhea: Secondary | ICD-10-CM | POA: Insufficient documentation

## 2013-02-18 DIAGNOSIS — N3010 Interstitial cystitis (chronic) without hematuria: Secondary | ICD-10-CM | POA: Insufficient documentation

## 2013-02-18 HISTORY — DX: Personal history of other diseases of the digestive system: Z87.19

## 2013-02-18 HISTORY — PX: CYSTOSCOPY WITH URETHRAL DILATATION: SHX5125

## 2013-02-18 HISTORY — DX: Unspecified urethral stricture, male, unspecified site: N35.919

## 2013-02-18 HISTORY — DX: Personal history of transient ischemic attack (TIA), and cerebral infarction without residual deficits: Z86.73

## 2013-02-18 HISTORY — DX: Unspecified osteoarthritis, unspecified site: M19.90

## 2013-02-18 HISTORY — DX: Unspecified asthma, uncomplicated: J45.909

## 2013-02-18 LAB — POCT I-STAT, CHEM 8
BUN: 21 mg/dL (ref 6–23)
Calcium, Ion: 1.27 mmol/L (ref 1.13–1.30)
Chloride: 100 mEq/L (ref 96–112)
Creatinine, Ser: 1.1 mg/dL (ref 0.50–1.10)
Glucose, Bld: 117 mg/dL — ABNORMAL HIGH (ref 70–99)
HCT: 40 % (ref 36.0–46.0)
Hemoglobin: 13.6 g/dL (ref 12.0–15.0)
Potassium: 3.5 mEq/L — ABNORMAL LOW (ref 3.7–5.3)
Sodium: 142 mEq/L (ref 137–147)
TCO2: 27 mmol/L (ref 0–100)

## 2013-02-18 SURGERY — CYSTOSCOPY, WITH URETHRAL DILATION
Anesthesia: General | Site: Bladder

## 2013-02-18 MED ORDER — MIDAZOLAM HCL 2 MG/2ML IJ SOLN
INTRAMUSCULAR | Status: AC
  Filled 2013-02-18: qty 2

## 2013-02-18 MED ORDER — SENNOSIDES-DOCUSATE SODIUM 8.6-50 MG PO TABS: 1.0000 | ORAL_TABLET | Freq: Two times a day (BID) | ORAL | Status: DC

## 2013-02-18 MED ORDER — ONDANSETRON HCL 4 MG/2ML IJ SOLN
INTRAMUSCULAR | Status: DC | PRN
  Administered 2013-02-18: 4 mg via INTRAVENOUS

## 2013-02-18 MED ORDER — LIDOCAINE HCL (CARDIAC) 20 MG/ML IV SOLN
INTRAVENOUS | Status: DC | PRN
  Administered 2013-02-18: 100 mg via INTRAVENOUS

## 2013-02-18 MED ORDER — LACTATED RINGERS IV SOLN
INTRAVENOUS | Status: DC | PRN
  Administered 2013-02-18: 11:00:00 via INTRAVENOUS

## 2013-02-18 MED ORDER — GENTAMICIN IN SALINE 1.6-0.9 MG/ML-% IV SOLN
80.0000 mg | INTRAVENOUS | Status: DC
  Filled 2013-02-18: qty 50

## 2013-02-18 MED ORDER — MIDAZOLAM HCL 5 MG/5ML IJ SOLN
INTRAMUSCULAR | Status: DC | PRN
  Administered 2013-02-18 (×2): 1 mg via INTRAVENOUS

## 2013-02-18 MED ORDER — GENTAMICIN SULFATE 40 MG/ML IJ SOLN
300.0000 mg | Freq: Once | INTRAVENOUS | Status: DC
  Filled 2013-02-18: qty 7.5

## 2013-02-18 MED ORDER — PROPOFOL 10 MG/ML IV BOLUS
INTRAVENOUS | Status: DC | PRN
  Administered 2013-02-18: 200 mg via INTRAVENOUS

## 2013-02-18 MED ORDER — LACTATED RINGERS IV SOLN
INTRAVENOUS | Status: DC
Start: 2013-02-18 — End: 2013-02-18
  Administered 2013-02-18: 11:00:00 via INTRAVENOUS
  Filled 2013-02-18: qty 1000

## 2013-02-18 MED ORDER — PROMETHAZINE HCL 25 MG/ML IJ SOLN
6.2500 mg | INTRAMUSCULAR | Status: DC | PRN
  Filled 2013-02-18: qty 1

## 2013-02-18 MED ORDER — KETOROLAC TROMETHAMINE 30 MG/ML IJ SOLN
INTRAMUSCULAR | Status: DC | PRN
  Administered 2013-02-18: 30 mg via INTRAVENOUS

## 2013-02-18 MED ORDER — LACTATED RINGERS IV SOLN
INTRAVENOUS | Status: DC
Start: 2013-02-18 — End: 2013-02-18
  Filled 2013-02-18: qty 1000

## 2013-02-18 MED ORDER — GENTAMICIN SULFATE 40 MG/ML IJ SOLN
104.8500 mg | INTRAVENOUS | Status: DC | PRN
  Administered 2013-02-18: 300 mg via INTRAVENOUS

## 2013-02-18 MED ORDER — TRAMADOL HCL 50 MG PO TABS: 50.0000 mg | ORAL_TABLET | Freq: Four times a day (QID) | ORAL | Status: DC | PRN

## 2013-02-18 MED ORDER — BUPIVACAINE HCL (PF) 0.5 % IJ SOLN
INTRAMUSCULAR | Status: DC | PRN
  Administered 2013-02-18: 12:00:00 via INTRAVESICAL

## 2013-02-18 MED ORDER — DEXAMETHASONE SODIUM PHOSPHATE 4 MG/ML IJ SOLN
INTRAMUSCULAR | Status: DC | PRN
  Administered 2013-02-18: 4 mg via INTRAVENOUS

## 2013-02-18 MED ORDER — FENTANYL CITRATE 0.05 MG/ML IJ SOLN
INTRAMUSCULAR | Status: AC
  Filled 2013-02-18: qty 4

## 2013-02-18 MED ORDER — FENTANYL CITRATE 0.05 MG/ML IJ SOLN
INTRAMUSCULAR | Status: DC | PRN
  Administered 2013-02-18 (×2): 12.5 ug via INTRAVENOUS
  Administered 2013-02-18 (×2): 25 ug via INTRAVENOUS
  Administered 2013-02-18 (×2): 12.5 ug via INTRAVENOUS

## 2013-02-18 MED ORDER — STERILE WATER FOR IRRIGATION IR SOLN
Status: DC | PRN
  Administered 2013-02-18: 3000 mL

## 2013-02-18 MED ORDER — HYDROMORPHONE HCL PF 1 MG/ML IJ SOLN
0.2500 mg | INTRAMUSCULAR | Status: DC | PRN
  Filled 2013-02-18: qty 1

## 2013-02-18 SURGICAL SUPPLY — 19 items
BAG URO CATCHER STRL LF (DRAPE) ×2 IMPLANT
BALLN NEPHROSTOMY (BALLOONS)
BALLOON NEPHROSTOMY (BALLOONS) IMPLANT
CATH ROBINSON RED A/P 14FR (CATHETERS) ×2 IMPLANT
CLOTH BEACON ORANGE TIMEOUT ST (SAFETY) ×2 IMPLANT
DRAPE CAMERA CLOSED 9X96 (DRAPES) ×2 IMPLANT
ELECT REM PT RETURN 9FT ADLT (ELECTROSURGICAL) ×2
ELECTRODE REM PT RTRN 9FT ADLT (ELECTROSURGICAL) ×1 IMPLANT
GLOVE BIO SURGEON STRL SZ 6.5 (GLOVE) ×2 IMPLANT
GLOVE BIO SURGEON STRL SZ7.5 (GLOVE) ×2 IMPLANT
GLOVE INDICATOR 7.0 STRL GRN (GLOVE) ×2 IMPLANT
GOWN PREVENTION PLUS LG XLONG (DISPOSABLE) IMPLANT
GOWN STRL NON-REIN LRG LVL3 (GOWN DISPOSABLE) IMPLANT
GOWN STRL REUS W/TWL LRG LVL3 (GOWN DISPOSABLE) ×4 IMPLANT
GUIDEWIRE STR DUAL SENSOR (WIRE) IMPLANT
NEEDLE HYPO 18GX1.5 BLUNT FILL (NEEDLE) ×2 IMPLANT
PACK CYSTOSCOPY (CUSTOM PROCEDURE TRAY) ×2 IMPLANT
SYR 20CC LL (SYRINGE) ×2 IMPLANT
WATER STERILE IRR 3000ML UROMA (IV SOLUTION) ×2 IMPLANT

## 2013-02-18 NOTE — Anesthesia Preprocedure Evaluation (Signed)
Anesthesia Evaluation  Patient identified by MRN, date of birth, ID band Patient awake    Reviewed: Allergy & Precautions, H&P , NPO status , Patient's Chart, lab work & pertinent test results  History of Anesthesia Complications (+) history of anesthetic complications (Inadequate sedation with prior colonoscopy)  Airway Mallampati: II TM Distance: >3 FB Neck ROM: Full    Dental  (+) Caps and Dental Advisory Given   Pulmonary asthma , former smoker,  breath sounds clear to auscultation  Pulmonary exam normal       Cardiovascular hypertension, Pt. on medications and Pt. on home beta blockers + dysrhythmias Rhythm:Regular Rate:Normal     Neuro/Psych TIAnegative psych ROS   GI/Hepatic Neg liver ROS, hiatal hernia, GERD-  ,  Endo/Other  negative endocrine ROS  Renal/GU negative Renal ROS Bladder dysfunction      Musculoskeletal negative musculoskeletal ROS (+)   Abdominal   Peds  Hematology negative hematology ROS (+)   Anesthesia Other Findings   Reproductive/Obstetrics                           Anesthesia Physical Anesthesia Plan  ASA: II  Anesthesia Plan: General   Post-op Pain Management:    Induction: Intravenous  Airway Management Planned: LMA  Additional Equipment:   Intra-op Plan:   Post-operative Plan: Extubation in OR  Informed Consent: I have reviewed the patients History and Physical, chart, labs and discussed the procedure including the risks, benefits and alternatives for the proposed anesthesia with the patient or authorized representative who has indicated his/her understanding and acceptance.   Dental advisory given  Plan Discussed with: CRNA  Anesthesia Plan Comments: (Hx of TMJ disorder)        Anesthesia Quick Evaluation

## 2013-02-18 NOTE — Transfer of Care (Signed)
  Immediate Anesthesia Transfer of Care Note  Patient: Cynthia Peters  Procedure(s) Performed: Procedure(s) (LRB): CYSTOSCOPY WITH URETHRAL DILATATION ( NO BALLOON) AND HYDRODISTENSION (N/A)  Patient Location: PACU  Anesthesia Type: General  Level of Consciousness: awake, sedated, patient cooperative and responds to stimulation  Airway & Oxygen Therapy: Patient Spontanous Breathing and Patient connected to face mask oxygen  Post-op Assessment: Report given to PACU RN, Post -op Vital signs reviewed and stable and Patient moving all extremities  Post vital signs: Reviewed and stable  Complications: No apparent anesthesia complications

## 2013-02-18 NOTE — Discharge Instructions (Signed)
1 - You may have urinary urgency (bladder spasms) and bloody urine on / off x 1 week. This is normal. ° °2 - Call MD or go to ER for fever >102, severe pain / nausea / vomiting not relieved by medications, or acute change in medical status ° °Post Anesthesia Home Care Instructions ° °Activity: °Get plenty of rest for the remainder of the day. A responsible adult should stay with you for 24 hours following the procedure.  °For the next 24 hours, DO NOT: °-Drive a car °-Operate machinery °-Drink alcoholic beverages °-Take any medication unless instructed by your physician °-Make any legal decisions or sign important papers. ° °Meals: °Start with liquid foods such as gelatin or soup. Progress to regular foods as tolerated. Avoid greasy, spicy, heavy foods. If nausea and/or vomiting occur, drink only clear liquids until the nausea and/or vomiting subsides. Call your physician if vomiting continues. ° °Special Instructions/Symptoms: °Your throat may feel dry or sore from the anesthesia or the breathing tube placed in your throat during surgery. If this causes discomfort, gargle with warm salt water. The discomfort should disappear within 24 hours. °CYSTOSCOPY HOME CARE INSTRUCTIONS ° °Activity: °Rest for the remainder of the day.  Do not drive or operate equipment today.  You may resume normal activities in one to two days as instructed by your physician.  ° °Meals: °Drink plenty of liquids and eat light foods such as gelatin or soup this evening.  You may return to a normal meal plan tomorrow. ° °Return to Work: °You may return to work in one to two days or as instructed by your physician. ° °Special Instructions / Symptoms: °Call your physician if any of these symptoms occur: ° ° -persistent or heavy bleeding ° -bleeding which continues after first few urination ° -large blood clots that are difficult to pass ° -urine stream diminishes or stops completely ° -fever equal to or higher than 101 degrees  Farenheit. ° -cloudy urine with a strong, foul odor ° -severe pain ° °Females should always wipe from front to back after elimination.  You may feel some burning pain when you urinate.  This should disappear with time.  Applying moist heat to the lower abdomen or a hot tub bath may help relieve the pain. \ ° °Follow-Up / Date of Return Visit to Your Physician:  *** °Call for an appointment to arrange follow-up. ° °Patient Signature:  ________________________________________________________ ° °Nurse's Signature:  ________________________________________________________ ° °

## 2013-02-18 NOTE — Anesthesia Procedure Notes (Signed)
Procedure Name: LMA Insertion Date/Time: 02/18/2013 12:05 PM Performed by: Justice Rocher Pre-anesthesia Checklist: Patient identified, Emergency Drugs available, Suction available and Patient being monitored Patient Re-evaluated:Patient Re-evaluated prior to inductionOxygen Delivery Method: Circle System Utilized Preoxygenation: Pre-oxygenation with 100% oxygen Intubation Type: IV induction Ventilation: Mask ventilation without difficulty LMA: LMA inserted LMA Size: 4.0 Number of attempts: 1 Airway Equipment and Method: bite block Placement Confirmation: positive ETCO2 Tube secured with: Tape Dental Injury: Teeth and Oropharynx as per pre-operative assessment

## 2013-02-18 NOTE — Brief Op Note (Signed)
02/18/2013  12:19 PM  PATIENT:  Cynthia Peters  63 y.o. female  PRE-OPERATIVE DIAGNOSIS:  URETHRAL STRICTURE, HEMATURIA, POSSIBLE INTERSTITIAL CYSTITIS  POST-OPERATIVE DIAGNOSIS:  * No post-op diagnosis entered *  PROCEDURE:  Procedure(s): CYSTOSCOPY WITH URETHRAL DILATATION ( NO BALLOON) AND HYDRODISTENSION (N/A)  SURGEON:  Surgeon(s) and Role:    * Alexis Frock, MD - Primary  PHYSICIAN ASSISTANT:   ASSISTANTS: none   ANESTHESIA:   local and general  EBL:  Total I/O In: 100 [I.V.:100] Out: -   BLOOD ADMINISTERED:none  DRAINS: none   LOCAL MEDICATIONS USED:  MARCAINE     SPECIMEN:  No Specimen  DISPOSITION OF SPECIMEN:  N/A  COUNTS:  YES  TOURNIQUET:  * No tourniquets in log *  DICTATION: .Other Dictation: Dictation Number [id number]  PLAN OF CARE: Discharge to home after PACU  PATIENT DISPOSITION:  PACU - hemodynamically stable.   Delay start of Pharmacological VTE agent (>24hrs) due to surgical blood loss or risk of bleeding: not applicable

## 2013-02-18 NOTE — H&P (Signed)
Cynthia Peters is an 63 y.o. female.    Chief Complaint: Pre-OP Cysto, Urethral Dilation, Hydrodistention  HPI:   1 - Gross Hematuria - Pt with pink urine on/off x several and noted on UA at GYN in setting of negative culture. Remote 3PY smoker. No dye/chemical/rubber exposure. CT Urogram 01/2013 unremarkble.   2 - Dysuria / Urinary Urgency / Frequency - Pt wtih flairs if irritative voiding symptoms that come and go. UCX negative at times x several. Helped by pyridium. She has IBS, Anxiety, and Lichen Planus. Mother had interstitial cystitis. Pelvic 2014 w/o focal pain. PVR 2014 "59mL".   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 Had not tried estrogen based creams. SHe is post-menopasal.   PMH sig for HTN, Endometrial Ablation, Umbil hernia repair x2,Lichen pLanus, IBS, Anxiety/Depression.  Today Cynthia Peters is seen to proceed with elective operative cysto, urethral dilation, hydrodistension. No interval fevers. SHe has been on premarin in interval and most recent UA without infectious parameters.  Past Medical History  Diagnosis Date  . Palpitations   . GERD (gastroesophageal reflux disease)   . Hypertension   . Mild asthma   . Urethral stricture   . Hyperlipidemia   . Complication of anesthesia     2013 SURGERY vocal cord bothering pt, used smaller ent tube after 2 hernia repair, no problem after  . H/O hiatal hernia   . Frequency of urination   . Hematuria   . Arthritis   . Wears contact lenses   . History of TMJ disorder     Past Surgical History  Procedure Laterality Date  . Cesarean section    . Colonoscopy with propofol N/A 08/10/2012    Procedure: COLONOSCOPY WITH PROPOFOL;  Surgeon: Garlan Fair, MD;  Location: [facility];  Service: Endoscopy;  Laterality: N/A;  . Endometrial ablation w/ hydrothermablator  09-27-2002  . Umbilical hernia repair  2013    AND REVISION THE SAME YEAR W/ MESH  . Transthoracic echocardiogram  05-26-2011    MILD LVH/  EF 40-98%/  GRADE I DIASTOLIC DYSFUNCTION/  MILD AR//   06-20-2008  NOMRAL STRESS ECHO  . Wisdom tooth extraction  AGE 86    Family History  Problem Relation Age of Onset  . Dementia Mother   . Leukemia Child    Social History:  reports that she quit smoking about 37 years ago. Her smoking use included Cigarettes. She has a .5 pack-year smoking history. She has never used smokeless tobacco. She reports that she drinks alcohol. She reports that she does not use illicit drugs.  Allergies:  Allergies  Allergen Reactions  . Hydrocodone Rash    No prescriptions prior to admission    No results found for this or any previous visit (from the past 48 hour(s)). No results found.  Review of Systems  Constitutional: Negative.  Negative for fever and chills.  HENT: Negative.   Eyes: Negative.   Respiratory: Negative.   Cardiovascular: Negative.   Gastrointestinal: Negative.   Genitourinary: Positive for dysuria, urgency and frequency. Negative for hematuria and flank pain.  Musculoskeletal: Negative.   Skin: Negative.   Neurological: Negative.   Endo/Heme/Allergies: Negative.   Psychiatric/Behavioral: Negative.     Height 5\' 4"  (1.626 m),  weight 70.761 kg (156 lb), last menstrual period 02/04/2004. Physical Exam  Constitutional: She is oriented to person, place, and time. She appears well-developed and well-nourished.  HENT:  Head: Normocephalic and atraumatic.  Eyes: Pupils are equal, round, and reactive to light.  Neck: Normal range of motion. Neck supple.  Cardiovascular: Normal rate.   Respiratory: Effort normal and breath sounds normal.  GI: Soft. Bowel sounds are normal.  Genitourinary:  No CVAT   Musculoskeletal: Normal range of motion.  Neurological: She is alert and oriented to person, place, and time.  Skin: Skin is warm and dry.  Psychiatric: She has a normal mood and affect. Her behavior is normal. Judgment and thought content normal.     Assessment/Plan  1 - Gross Hematuria - Pt still needs cysto to complete eval. Given overall picture concerning for possible IC as well as urethral stenosis, may be most prudent to perform in OR in conjunciton with HOD and urethral dilation. Pt wants to proceed.  2 - Dysuria / Urinary Urgency / Frequency - LIkely interstitial cystitis given personal history, family history, and symptoms. Will proceed with cysto-HOD as per above.  We rediscussed the potential diagnostic and therapeutic roles of hydrodistension in detail. We rementioned that it can help rule-out other causes of urinary symptoms and help confirm interstitial cystitis as well help with risk stratification and prognosis based on cystometric capacity and presence / absence of Hunner's lesions. We rediscussed that hydrodistention will often cause a transient worsening of symptoms which is then followed by a "quiescent" period of significant symptom improvement in some patients, and that this therapeutic benefit can sometimes last for months or more. We rementioned that ablation / resection of Hunner's lesions, if present, can help symptoms. We then rediscussed risks including bleeding, infection, and specifically discussed bladder perforation which possible and usually treated by catheter drainage unless severe. Finally we rediscussed anesthetic risks as well including DVT, PE, MI, and mortality.   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   Proceed as scheduled today.  Cynthia Peters 02/18/2013, 6:05 AM

## 2013-02-19 NOTE — Anesthesia Postprocedure Evaluation (Signed)
Anesthesia Post Note  Patient: Cynthia Peters  Procedure(s) Performed: Procedure(s) (LRB): CYSTOSCOPY WITH URETHRAL DILATATION ( NO BALLOON) AND HYDRODISTENSION (N/A)  Anesthesia type: General  Patient location: PACU  Post pain: Pain level controlled  Post assessment: Post-op Vital signs reviewed  Last Vitals:  Filed Vitals:   02/18/13 1445  BP: 130/75  Pulse: 63  Temp: 36.2 C  Resp: 18    Post vital signs: Reviewed  Level of consciousness: sedated  Complications: No apparent anesthesia complications

## 2013-02-21 ENCOUNTER — Encounter (HOSPITAL_BASED_OUTPATIENT_CLINIC_OR_DEPARTMENT_OTHER): Payer: Self-pay | Admitting: Urology

## 2013-02-21 NOTE — Op Note (Signed)
[name], [name] NO.:  [id number]  MEDICAL RECORD NO.:  [id number]  LOCATION:                                FACILITY:  [facility]  PHYSICIAN:  Alexis Frock, MD     DATE OF BIRTH:  [birth year]  DATE OF PROCEDURE: 02/18/2013 DATE OF DISCHARGE:  02/18/2013                              OPERATIVE REPORT   PREOPERATIVE DIAGNOSES:  Urethral stricture, history of lichen planus, urinary frequency, urgency.  POSTOPERATIVE DIAGNOSES:  Urethral stricture, history of lichen planus, urinary frequency, urgency plus interstitial cystitis.  COMPLICATIONS:  None.  SPECIMENS:  None.  FINDINGS: 1. Relatively narrow urethra, easily calibrated to 26-French. 2. 600 mL cystometric capacity of bladder. 3. Mild glomerulations in the bladder base and trigone right and left     aspects.  INDICATIONS:  Cynthia Peters is a pleasant 63 year old lady with history of urinary frequency, urgency that has been somewhat refractory to 1st line medical therapy.  She has also had problems of microscopic hematuria.  She had a CT urogram which is unremarkable.  She underwent an attempt at office cystoscopy; however, this could not be performed as the patient had very narrow urethra with changes consistent likely with lichen planus as she has known skin disorder.  We discussed options including re-attempt in the office after trial of topical creams versus operative cystoscopy with or without hydrodistention.  She wished to proceed with the latter.  Informed consent was obtained and placed in medical record.  Preoperatively the patient has been on Premarin cream for several weeks and has noted some relative softening of the area.  PROCEDURE IN DETAIL:  The patient being Cynthia Peters was verified. Procedure being cystoscopy, hydrodistention and urethral calibration was confirmed.  Procedure was carried out.  Time-out was performed.  Intravenous antibiotics were administered.  General  LMA anesthesia was introduced.  The patient was placed into a low lithotomy position.  Sterile field was created by prepping and draping the patient's vagina, introitus, and proximal thighs using iodine x3. Inspection of the external genitalia was unremarkable.  She had relative decrease in the amount of white scarified tissue consistent with lichen planus at the level of the urethra.  Urethra was then carefully calibrated sequentially in 2-French increments from 8-French to 26- [place].  Next, a 22-French rigid cystoscope was used to perform cystoscopy.  Inspection of the urinary bladder revealed no diverticula, calcifications, papular lesions.  Ureteral orifices in the normal anatomic position.  Bladder was gravity filled to pressure of 60cm H2) to  cystometric capacity, held for 90 seconds and the release cystometric capacity was 600 mL.  Repeat cystoscopy revealed mild-to-moderate glomerulations most notably in the Inter-trigone area as well as bilaterally lateral to ureteral orifices.  There was no evidence of bladder perforation.  Bladder was emptied per cystoscope and a slurry of Marcaine and 40 mg of Pyridium were then instilled via catheter into the urinary bladder.  Procedure was then terminated.  The patient tolerated the procedure well.  There were no immediate periprocedural complications.  The patient was taken to postanesthesia care unit in stable condition.          ______________________________ Alexis Frock, MD  TM/MEDQ  D:  02/18/2013  T:  02/19/2013  Job:  [id number]

## 2013-03-01 ENCOUNTER — Other Ambulatory Visit: Payer: Self-pay | Admitting: Gynecology

## 2013-03-01 NOTE — Telephone Encounter (Signed)
Left Message To Call Back  

## 2013-03-01 NOTE — Telephone Encounter (Signed)
Patient notified tried to schedule AEX patient doesn't have a AEX infront of her wil call back and schedule AEX around April/May

## 2013-03-01 NOTE — Telephone Encounter (Signed)
Last AEX 11/27/2011. Last refill 01/04/2013 #20/0 refills. No future appt.   Please advise.

## 2013-03-01 NOTE — Telephone Encounter (Signed)
Inform pt that with all of the lichen planus appt's we missed her annual, we should try to see her April/May so we can also check the status of the LP, we can refill ambien as well-sent in

## 2013-03-03 ENCOUNTER — Other Ambulatory Visit: Payer: Self-pay | Admitting: Gynecology

## 2013-03-04 NOTE — Telephone Encounter (Signed)
Left Message To Call Back  

## 2013-03-04 NOTE — Telephone Encounter (Signed)
Per Dr. Charlies Constable need to clarify how patient is taking does; s/w patient she takes it almost daily but just 1/2 a tablet, okay to refill #20/3 refills per Dr. Charlies Constable.  Ambien 10 mg #20/3 refils sent to [facility] patient is aware.

## 2013-03-04 NOTE — Telephone Encounter (Signed)
Last refill 01/04/2013. Last AEX 11/27/2011. No future appt.  Wil refill per Dr. Charlies Constable last telephone note.

## 2013-03-07 ENCOUNTER — Ambulatory Visit (INDEPENDENT_AMBULATORY_CARE_PROVIDER_SITE_OTHER): Payer: BC Managed Care – PPO | Admitting: Physician Assistant

## 2013-03-07 ENCOUNTER — Encounter: Payer: Self-pay | Admitting: Physician Assistant

## 2013-03-07 ENCOUNTER — Telehealth: Payer: Self-pay | Admitting: Physician Assistant

## 2013-03-07 VITALS — BP 130/90 | HR 67 | Ht 64.0 in | Wt 157.0 lb

## 2013-03-07 DIAGNOSIS — R0602 Shortness of breath: Secondary | ICD-10-CM

## 2013-03-07 DIAGNOSIS — R079 Chest pain, unspecified: Secondary | ICD-10-CM

## 2013-03-07 DIAGNOSIS — E876 Hypokalemia: Secondary | ICD-10-CM

## 2013-03-07 DIAGNOSIS — I4949 Other premature depolarization: Secondary | ICD-10-CM

## 2013-03-07 DIAGNOSIS — R002 Palpitations: Secondary | ICD-10-CM

## 2013-03-07 DIAGNOSIS — I10 Essential (primary) hypertension: Secondary | ICD-10-CM

## 2013-03-07 DIAGNOSIS — I493 Ventricular premature depolarization: Secondary | ICD-10-CM

## 2013-03-07 DIAGNOSIS — E785 Hyperlipidemia, unspecified: Secondary | ICD-10-CM

## 2013-03-07 LAB — MAGNESIUM: Magnesium: 1.8 mg/dL (ref 1.5–2.5)

## 2013-03-07 LAB — BASIC METABOLIC PANEL
BUN: 20 mg/dL (ref 6–23)
CO2: 29 mEq/L (ref 19–32)
Calcium: 9.4 mg/dL (ref 8.4–10.5)
Chloride: 103 mEq/L (ref 96–112)
Creatinine, Ser: 1.1 mg/dL (ref 0.4–1.2)
GFR: 56.61 mL/min — ABNORMAL LOW (ref >60.00)
Glucose, Bld: 126 mg/dL — ABNORMAL HIGH (ref 70–99)
Potassium: 3.2 mEq/L — ABNORMAL LOW (ref 3.5–5.1)
Sodium: 140 mEq/L (ref 135–145)

## 2013-03-07 MED ORDER — METOPROLOL SUCCINATE ER 50 MG PO TB24: 100.0000 mg | ORAL_TABLET | Freq: Every day | ORAL | Status: DC

## 2013-03-07 MED ORDER — METOPROLOL SUCCINATE ER 50 MG PO TB24: 50.0000 mg | ORAL_TABLET | Freq: Every day | ORAL | Status: DC

## 2013-03-07 MED ORDER — POTASSIUM CHLORIDE ER 20 MEQ PO TBCR: 20.0000 meq | EXTENDED_RELEASE_TABLET | ORAL | Status: DC

## 2013-03-07 NOTE — Telephone Encounter (Signed)
Follow Up ° °Pt returned call//  °

## 2013-03-07 NOTE — Telephone Encounter (Signed)
pt notified about lab results and to start K+ 20 meq daily, 1st dose of 40 meq, then start 20 meq daily. bmet 2/10.

## 2013-03-07 NOTE — Progress Notes (Signed)
[address] [place], [place]  [address] Phone: [phone] Fax:  [phone]  Date:  03/07/2013   ID:  Cynthia Peters, DOB [birth year], MRN [id number]  PCP:  [name]  Cardiologist:  Dr. Peter Martinique    History of Present Illness: Cynthia Peters is a 63 y.o. female with a hx of palpitations, HTN, HL, GERD.  Event monitor in 2007 demonstrated rare PVCs.  ETT-Echo in 2010 was normal.  She was evaluated by Dr. Peter Martinique in 08/2010 for palpitations.  Symptoms were felt to be due to PVCs and prn beta blocker was recommended.  She was admitted with a TIA in 05/2011.  Echo (05/2011):  Mild LVH, EF 65-70%, no WMA, Gr 1 DD, mild AI.   Carotid US (05/2011):  No ICA stenosis.    Patient underwent cystoscopy several weeks ago. She took some tramadol after this and her heart started racing. Since that time, she has had recurrent palpitations. She saw her PCP who increased her beta blocker to twice a day. This helped for a couple days but her symptoms resumed after this. Palpitations seem to come and go. She notes that after eating. She sometimes feels anxious with this. She denies lightheadedness, near syncope or syncope. She has had some chest tightness with her palpitations. She denies any exertional chest pain. She denies significant dyspnea. She denies orthopnea, PND or edema.  Recent Labs: 02/18/2013: Creatinine 1.10; Hemoglobin 13.6; Potassium 3.5*   Wt Readings from Last 3 Encounters:  03/07/13 157 lb (71.215 kg)  02/18/13 154 lb (69.854 kg)  02/18/13 154 lb (69.854 kg)     Past Medical History  Diagnosis Date  . Palpitations   . GERD (gastroesophageal reflux disease)   . Hypertension   . Mild asthma   . Urethral stricture   . Hyperlipidemia   . Complication of anesthesia     2013 SURGERY vocal cord bothering pt, used smaller ent tube after 2 hernia repair, no problem after  . H/O hiatal hernia   . Frequency of urination   . Hematuria   . Arthritis     . Wears contact lenses   . History of TMJ disorder     Current Outpatient Prescriptions  Medication Sig Dispense Refill  . acetaminophen (TYLENOL) 500 MG tablet Take 1,000 mg by mouth every 6 (six) hours as needed. For pain       . albuterol (VENTOLIN HFA) 108 (90 BASE) MCG/ACT inhaler Inhale 2 puffs into the lungs every 6 (six) hours as needed. For shortness of breath and wheezing      . buPROPion (WELLBUTRIN XL) 300 MG 24 hr tablet Take 300 mg by mouth every morning.       . Calcium Carbonate-Vitamin D (CALTRATE 600+D) 600-400 MG-UNIT per tablet Take 2 tablets by mouth daily.       . cholecalciferol (VITAMIN D) 1000 UNITS tablet Take 1,000 Units by mouth daily.      . clobetasol ointment (TEMOVATE) 0.05 % as needed. APPLY TO AFFECTED AREA TWICE A DAY      . Coenzyme Q10 (CO Q 10) 100 MG CAPS Take 1 capsule by mouth daily.      Marland Kitchen dicyclomine (BENTYL) 10 MG capsule Take 10 mg by mouth 3 (three) times daily as needed (IBS).       . Evening Primrose Oil 1000 MG CAPS Take 2 capsules by mouth daily.        . Flaxseed, Linseed, 1000 MG CAPS Take  2 capsules by mouth daily.       . hydrochlorothiazide 25 MG tablet Take 25 mg by mouth every morning.       . hydrocortisone (ANUSOL-HC) 25 MG suppository Place rectally as needed.       . lidocaine (XYLOCAINE) 5 % ointment Apply topically 3 (three) times daily as needed.      Marland Kitchen losartan (COZAAR) 50 MG tablet Take 50 mg by mouth every morning.       . metoprolol (TOPROL-XL) 50 MG 24 hr tablet Take 50 mg by mouth 2 (two) times daily.       . Multiple Vitamin (MULTIVITAMIN WITH MINERALS) TABS Take 1 tablet by mouth daily.      . naproxen (NAPROSYN) 500 MG tablet Take 500 mg by mouth as needed.      Marland Kitchen omeprazole (PRILOSEC OTC) 20 MG tablet Take 20 mg by mouth every morning.       . phenazopyridine (PYRIDIUM) 200 MG tablet Take 1 tablet (200 mg total) by mouth 3 (three) times daily as needed for pain.  10 tablet  0  . Probiotic Product (ALIGN PO) Take 1  capsule by mouth daily.       Marland Kitchen senna-docusate (SENOKOT-S) 8.6-50 MG per tablet Take 1 tablet by mouth 2 (two) times daily. While taking pain meds to prevent constipation  30 tablet  0  . simvastatin (ZOCOR) 40 MG tablet Take 40 mg by mouth every morning.      . zolpidem (AMBIEN) 10 MG tablet TAKE 1/2 TABLET AT BEDTIME AS NEEDED FOR SLEEP  20 tablet  3  . zolpidem (AMBIEN) 10 MG tablet TAKE 1/2 TABLET BY MOUTH AT BEDTIME AS NEEDED FOR SLEEP  20 tablet  0  . PREMARIN vaginal cream Apply 2 times a week       No current facility-administered medications for this visit.    Allergies:   Hydrocodone   Social History:  The patient  reports that she quit smoking about 37 years ago. Her smoking use included Cigarettes. She has a .5 pack-year smoking history. She has never used smokeless tobacco. She reports that she drinks alcohol. She reports that she does not use illicit drugs.   Family History:  The patient's family history includes Dementia in her mother; Leukemia in her child.   ROS:  Please see the history of present illness.      All other systems reviewed and negative.   PHYSICAL EXAM: VS:  BP 130/90  Pulse 67  Ht 5\' 4"  (1.626 m)  Wt 157 lb (71.215 kg)  BMI 26.94 kg/m2 Well nourished, well developed, in no acute distress HEENT: normal Neck: no JVD Endocrine: No thyromegaly Cardiac:  normal S1, S2; RRR; no murmur Lungs:  clear to auscultation bilaterally, no wheezing, rhonchi or rales Abd: soft, nontender, no hepatomegaly Ext: no edema Skin: warm and dry Neuro:  CNs 2-12 intact, no focal abnormalities noted  EKG:  NSR, HR 67, LAD, septal Q waves, no significant change when compared to prior tracing     ASSESSMENT AND PLAN:  1. Palpitations:  She did have a PVC noted on ECG today. I suspect her symptoms are related to symptomatic PVCs. Her symptoms are fairly frequent. I have asked her to adjust her Toprol to 100 mg daily instead of 50 mg twice a day. She can take an extra half a  tablet as needed. We discussed the importance of reducing caffeine intake. I will set her up for an exercise treadmill  test to assess for increased ectopy with exercise as well as to rule out ischemic heart disease. Of note, patient recently had foot surgery. She may not be able to complete an exercise treadmill test. If this is the case, consider dobutamine echo. I will arrange to 48 Holter monitor. I will check a basic metabolic panel and magnesium today. She has had hypokalemia in the past which may be a trigger for her PVCs. 2. Hypertension:  Fair control. Continue current therapy. 3. Hyperlipidemia:  Continue statin. 4. Disposition:  Follow up with me or Dr. Acie Fredrickson in one month.  Signed, Richardson Dopp, PA-C  03/07/2013 10:52 AM

## 2013-03-07 NOTE — Patient Instructions (Addendum)
Your physician has recommended you make the following change in your medication: CHANGE METOPROLOL TO 100 MG IN THE MORNING; YOU CAN TAKE EXTRA 1/2 TABLET  FOR BREAK THROUGH PALPITATIONS  LAB WORK TODAY; BMET, MAGNESIUM LEVEL  Your physician has requested that you have an exercise tolerance test DX 786.50, 786.05. For further information please visit [email or web address]. Please also follow instruction sheet, as given.  Your physician has recommended that you wear a 48 HOUR holter monitor DX PVC'S, 785.1. Holter monitors are medical devices that record the heart's electrical activity. Doctors most often use these monitors to diagnose arrhythmias. Arrhythmias are problems with the speed or rhythm of the heartbeat. The monitor is a small, portable device. You can wear one while you do your normal daily activities. This is usually used to diagnose what is causing palpitations/syncope (passing out).   Your physician recommends that you schedule a follow-up appointment in: [place], PAC SAME DAY DR. Martinique IS IN THE OFFICE

## 2013-03-08 ENCOUNTER — Encounter: Payer: Self-pay | Admitting: Podiatry

## 2013-03-08 ENCOUNTER — Encounter: Payer: Self-pay | Admitting: *Deleted

## 2013-03-08 ENCOUNTER — Ambulatory Visit (INDEPENDENT_AMBULATORY_CARE_PROVIDER_SITE_OTHER): Payer: BC Managed Care – PPO | Admitting: Podiatry

## 2013-03-08 ENCOUNTER — Encounter (INDEPENDENT_AMBULATORY_CARE_PROVIDER_SITE_OTHER): Payer: BC Managed Care – PPO

## 2013-03-08 ENCOUNTER — Telehealth: Payer: Self-pay | Admitting: *Deleted

## 2013-03-08 ENCOUNTER — Ambulatory Visit (INDEPENDENT_AMBULATORY_CARE_PROVIDER_SITE_OTHER): Payer: BC Managed Care – PPO

## 2013-03-08 VITALS — BP 136/80 | HR 77 | Resp 16

## 2013-03-08 DIAGNOSIS — S92309A Fracture of unspecified metatarsal bone(s), unspecified foot, initial encounter for closed fracture: Secondary | ICD-10-CM

## 2013-03-08 DIAGNOSIS — I493 Ventricular premature depolarization: Secondary | ICD-10-CM

## 2013-03-08 DIAGNOSIS — I4949 Other premature depolarization: Secondary | ICD-10-CM

## 2013-03-08 DIAGNOSIS — R002 Palpitations: Secondary | ICD-10-CM

## 2013-03-08 NOTE — Progress Notes (Signed)
Cynthia Peters presents today for followup of her fifth metatarsal fracture. She continues to use her bone stimulator twice daily. She states that as of late the fifth metatarsal has become more painful. She denies any further trauma to the foot.  Objective: Vital signs are stable she is alert and oriented x3. Pulses are strongly palpable left lower extremity. There is no erythema edema cellulitis drainage or odor overlying the fifth metatarsal of the left foot. Radiographic evaluation demonstrates a diastases of the plantar cortex a lateral view. AP view demonstrates well-healing fracture fifth metatarsal. She has tenderness on palpation of the fracture site.  Assessment: Slowly healing Jones fracture fifth metatarsal left foot.  Plan: Discussed etiology pathology conservative versus surgical therapies at this point I'm going suggested she continue to use her bone stimulator for the next 6-8 weeks. I will followup with her at that time for another x-ray. I fully expect complete consolidation by that time.

## 2013-03-08 NOTE — Telephone Encounter (Signed)
Pt states still is on disability and would like a new handicap sticker.  I will refer to check receptionist to complete 3 month handicap sticker for pt.

## 2013-03-08 NOTE — Progress Notes (Signed)
Patient ID: Cynthia Peters, female   DOB: 1950/12/02, 63 y.o.   MRN: [id number] E-Cardio 48 hour holter monitor applied to patient.

## 2013-03-15 ENCOUNTER — Ambulatory Visit (INDEPENDENT_AMBULATORY_CARE_PROVIDER_SITE_OTHER): Payer: BC Managed Care – PPO | Admitting: Gynecology

## 2013-03-15 ENCOUNTER — Telehealth: Payer: Self-pay | Admitting: Gynecology

## 2013-03-15 ENCOUNTER — Encounter: Payer: Self-pay | Admitting: Gynecology

## 2013-03-15 VITALS — BP 124/68 | Temp 97.8°F | Resp 16 | Ht 64.0 in | Wt 156.0 lb

## 2013-03-15 DIAGNOSIS — Z658 Other specified problems related to psychosocial circumstances: Secondary | ICD-10-CM

## 2013-03-15 DIAGNOSIS — L439 Lichen planus, unspecified: Secondary | ICD-10-CM

## 2013-03-15 DIAGNOSIS — R300 Dysuria: Secondary | ICD-10-CM

## 2013-03-15 DIAGNOSIS — R002 Palpitations: Secondary | ICD-10-CM

## 2013-03-15 DIAGNOSIS — N3010 Interstitial cystitis (chronic) without hematuria: Secondary | ICD-10-CM

## 2013-03-15 DIAGNOSIS — Z659 Problem related to unspecified psychosocial circumstances: Secondary | ICD-10-CM

## 2013-03-15 LAB — POCT URINALYSIS DIPSTICK
Urobilinogen, UA: NEGATIVE
pH, UA: 5

## 2013-03-15 NOTE — Progress Notes (Signed)
Subjective:     Patient ID: Cynthia Peters, female   DOB: Dec 08, 1950, 63 y.o.   MRN: [id number]  HPI Comments: Pt here for what she thinks may be a reflare of her Lichen Planus.  Pt recently had a cystoscopy and urethra showed scarring c/w LP.  She was also diagnosed with interstitial cystitis.  Pt had a urethral dilation and some bladder distension but no other treatment.  Pt reports some dysuria now with cramping, no hematuria, fever. Pt also had some issues with heart palpitations and was started on a betablocker, recently completed a Holter monitor.  Pt is to see cardiology this week for follow up. Pt reports increase in stress, father just died from pneumonia due to flu-he was in his 56's, dealing with it ok. Lastly pt reports that she recently attempted to be sexually active after years and had issues with pain, dryness and tightness.  She was started on estrogen recently by urology without vaginal priming, placed more near urethra.  Menopause >10y ago, no HRT, was not interested in hormones due to cardiac history    Review of Systems     Objective:   Physical Exam  Nursing note and vitals reviewed. Constitutional: She is oriented to person, place, and time. She appears well-developed and well-nourished.  Genitourinary: Vagina normal and uterus normal.    There is no rash, tenderness or lesion on the right labia. There is no rash, tenderness or lesion on the left labia. Cervix exhibits no motion tenderness and no discharge. Right adnexum displays no mass and no tenderness. Left adnexum displays no mass and no tenderness.  Neurological: She is alert and oriented to person, place, and time.       Assessment:     Lichen planus flare Social stress Vaginal atrophy Cardiac disease  Interstitial cystitis    Plan:     Use cocoanut oil as vaginal lubricant, pt prefers to not use hormones due to cardiac history Apply temovate near the rectum on the 2 ulcers and at 9:30 by the  hymen twice a day Urine culture Condolences on death of father

## 2013-03-15 NOTE — Telephone Encounter (Signed)
Spoke with pt who has been experiencing a flare of lichen planus vaginally for a few days. Pt able to come for OV today at 2 pm, as TL has no other appts this week available. Pt appreciative.

## 2013-03-15 NOTE — Telephone Encounter (Signed)
Pt states she has been coming in for an on going issue.  She thinks she is having a flare up wants to come in to see Dr Charlies Constable.

## 2013-03-15 NOTE — Patient Instructions (Signed)
Use cocoanut oil as vaginal lubricant Apply temovate near the rectum on the 2 ulcers and at 9:30 by the hymen twice a day

## 2013-03-17 ENCOUNTER — Other Ambulatory Visit: Payer: Self-pay | Admitting: Gynecology

## 2013-03-17 DIAGNOSIS — N390 Urinary tract infection, site not specified: Secondary | ICD-10-CM

## 2013-03-17 LAB — URINE CULTURE: Colony Count: 30000

## 2013-03-17 MED ORDER — CEPHALEXIN 500 MG PO CAPS: 500.0000 mg | ORAL_CAPSULE | Freq: Four times a day (QID) | ORAL | Status: DC

## 2013-03-17 MED ORDER — FLUCONAZOLE 150 MG PO TABS: 150.0000 mg | ORAL_TABLET | Freq: Once | ORAL | Status: DC

## 2013-03-17 NOTE — Telephone Encounter (Signed)
Spoke to patient Dr.Jordan reviewed 48 holter monitor which revealed rare pvc's.Advised to keep appointment with Richardson Dopp PA 04/04/13.

## 2013-03-18 ENCOUNTER — Telehealth: Payer: Self-pay | Admitting: Gynecology

## 2013-03-18 NOTE — Telephone Encounter (Signed)
Patient is calling to get results from urine culture she said she cot a call from pharmacy that medication had been called in but had not gotten the results yet.

## 2013-03-18 NOTE — Telephone Encounter (Signed)
Patient is calling To get results from urine culture. Said she got a call saying medication had been called in but she hadnt heard form anyone here.

## 2013-03-18 NOTE — Telephone Encounter (Signed)
Patient aware. Given results minutes later:  Notes Recorded by Olga Millers, CMA on 03/18/2013 at 10:40 AM Patient notified and aware to take Jeddito; is aware that both are sent to pharmacy. ------  Notes Recorded by Azalia Bilis, MD on 03/17/2013 at 8:38 AM Inform UTI, will treat with keflex may have diflucan

## 2013-03-23 ENCOUNTER — Ambulatory Visit: Payer: BC Managed Care – PPO | Admitting: Physician Assistant

## 2013-03-23 ENCOUNTER — Other Ambulatory Visit: Payer: Self-pay | Admitting: Gynecology

## 2013-03-23 ENCOUNTER — Telehealth: Payer: Self-pay | Admitting: *Deleted

## 2013-03-23 NOTE — Telephone Encounter (Signed)
Patient having trouble with bone stimulator. Its out of treatments and doesn't know what she should do.

## 2013-03-24 NOTE — Telephone Encounter (Signed)
Last AEX 11/27/11 Last refill 03/04/13 #20/0 refills Next appt 03/29/2013  Will refill until appt. Per Dr. Charlies Constable refill note.

## 2013-03-24 NOTE — Telephone Encounter (Signed)
Forward to Lisa 

## 2013-03-28 ENCOUNTER — Ambulatory Visit (INDEPENDENT_AMBULATORY_CARE_PROVIDER_SITE_OTHER): Payer: BC Managed Care – PPO | Admitting: Physician Assistant

## 2013-03-28 ENCOUNTER — Encounter: Payer: Self-pay | Admitting: Physician Assistant

## 2013-03-28 VITALS — BP 120/78 | HR 63 | Ht 64.0 in | Wt 155.0 lb

## 2013-03-28 DIAGNOSIS — E876 Hypokalemia: Secondary | ICD-10-CM

## 2013-03-28 DIAGNOSIS — I493 Ventricular premature depolarization: Secondary | ICD-10-CM

## 2013-03-28 DIAGNOSIS — R002 Palpitations: Secondary | ICD-10-CM

## 2013-03-28 DIAGNOSIS — I4949 Other premature depolarization: Secondary | ICD-10-CM

## 2013-03-28 DIAGNOSIS — I10 Essential (primary) hypertension: Secondary | ICD-10-CM

## 2013-03-28 NOTE — Progress Notes (Signed)
[address], [place], [place]  [address] Phone: [phone] Fax:  [phone]  Date:  03/28/2013   ID:  Cynthia Peters, DOB 08/03/1950, MRN [id number]  PCP:  [name]  Cardiologist:  Dr. Peter Martinique    History of Present Illness: Cynthia Peters is a 63 y.o. female with a hx of palpitations, HTN, HL, GERD.  Event monitor in 2007 demonstrated rare PVCs.  ETT-Echo in 2010 was normal.  She was evaluated by Dr. Peter Martinique in 08/2010 for palpitations.  Symptoms were felt to be due to PVCs and prn beta blocker was recommended.  She was admitted with a TIA in 05/2011.  Echo (05/2011):  Mild LVH, EF 65-70%, no WMA, Gr 1 DD, mild AI.   Carotid US (05/2011):  No ICA stenosis.    I saw her earlier this month with increased palpitations.  She was to have an ETT but this has not been done yet.  Holter monitor demonstrated NSR, occ PVCs.  Her K+ was quite low when checked and we added K+ supplement to her medications.  She is feeling somewhat better.  She does still have occasional palpitations.  She denies chest pain, dyspnea, syncope, orthopnea, PND, edema.    Recent Labs: 02/18/2013: Hemoglobin 13.6  03/07/2013: Creatinine 1.1; Potassium 3.2*   Wt Readings from Last 3 Encounters:  03/28/13 155 lb (70.308 kg)  03/15/13 156 lb (70.761 kg)  03/07/13 157 lb (71.215 kg)     Past Medical History  Diagnosis Date  . Palpitations   . GERD (gastroesophageal reflux disease)   . Hypertension   . Mild asthma   . Urethral stricture   . Hyperlipidemia   . Complication of anesthesia     2013 SURGERY vocal cord bothering pt, used smaller ent tube after 2 hernia repair, no problem after  . H/O hiatal hernia   . Frequency of urination   . Hematuria   . Arthritis   . Wears contact lenses   . History of TMJ disorder     Current Outpatient Prescriptions  Medication Sig Dispense Refill  . acetaminophen (TYLENOL) 500 MG tablet Take 1,000 mg by mouth every 6 (six) hours as  needed. For pain       . buPROPion (WELLBUTRIN XL) 300 MG 24 hr tablet Take 300 mg by mouth every morning.       . Calcium Carbonate-Vitamin D (CALTRATE 600+D) 600-400 MG-UNIT per tablet Take 2 tablets by mouth daily.       . cephALEXin (KEFLEX) 500 MG capsule Take 1 capsule (500 mg total) by mouth 4 (four) times daily. Take QID for 7 days.  28 capsule  0  . cholecalciferol (VITAMIN D) 1000 UNITS tablet Take 1,000 Units by mouth daily.      . clobetasol ointment (TEMOVATE) 0.05 % as needed. APPLY TO AFFECTED AREA TWICE A DAY      . Coenzyme Q10 (CO Q 10) 100 MG CAPS Take 1 capsule by mouth daily.      . Evening Primrose Oil 1000 MG CAPS Take 2 capsules by mouth daily.        . Flaxseed, Linseed, 1000 MG CAPS Take 2 capsules by mouth daily.       . hydrochlorothiazide 25 MG tablet Take 25 mg by mouth every morning.       . lidocaine (XYLOCAINE) 5 % ointment Apply topically 3 (three) times daily as needed.      Marland Kitchen losartan (COZAAR) 50 MG tablet  Take 50 mg by mouth every morning.       . metoprolol succinate (TOPROL-XL) 50 MG 24 hr tablet Take 2 tablets (100 mg total) by mouth daily. OK TAKE EXTRA 1/2 TAB FOR BREAKTHROUGH PALPITATIONS      . Multiple Vitamin (MULTIVITAMIN WITH MINERALS) TABS Take 1 tablet by mouth daily.      Marland Kitchen omeprazole (PRILOSEC OTC) 20 MG tablet Take 20 mg by mouth every morning.       . Potassium Chloride ER 20 MEQ TBCR Take 20 mEq by mouth as directed. YOUR 1ST DOSE TAKE 2 TABS = 40 MEQ; THEN START 1 TAB DAILY  35 tablet  11  . PREMARIN vaginal cream Apply 2 times a week      . Probiotic Product (ALIGN PO) Take 1 capsule by mouth daily.       . simvastatin (ZOCOR) 40 MG tablet Take 40 mg by mouth every morning.      . zolpidem (AMBIEN) 10 MG tablet TAKE 1/2 TABLET AT BEDTIME AS NEEDED FOR SLEEP  20 tablet  0   No current facility-administered medications for this visit.    Allergies:   Hydrocodone   Social History:  The patient  reports that she quit smoking about 37  years ago. Her smoking use included Cigarettes. She has a .5 pack-year smoking history. She has never used smokeless tobacco. She reports that she drinks alcohol. She reports that she does not use illicit drugs.   Family History:  The patient's family history includes Dementia in her mother; Leukemia in her child.   ROS:  Please see the history of present illness.   All other systems reviewed and negative.   PHYSICAL EXAM: VS:  BP 120/78  Pulse 63  Ht 5\' 4"  (1.626 m)  Wt 155 lb (70.308 kg)  BMI 26.59 kg/m2 Well nourished, well developed, in no acute distress HEENT: normal Neck: no JVD Cardiac:  normal S1, S2; RRR; no murmur Lungs:  clear to auscultation bilaterally, no wheezing, rhonchi or rales Abd: soft, nontender, no hepatomegaly Ext: no edema Skin: warm and dry Neuro:  CNs 2-12 intact, no focal abnormalities noted  EKG:  NSR, HR 63, LAD, no ST changes    ASSESSMENT AND PLAN:  1. Palpitations:  She is having symptomatic PVCs.  I suspect this is exacerbated by hypokalemia. Continue K+ supplement.  Repeat BMET today.  She has a broken foot.  At this point, I do not think it is necessary to pursue an ETT.  ETT will be cancelled for now.   If her K+ is normal, I will reschedule her ETT for 6 weeks to allow her foot to heal.   2. Hypokalemia:  Check BMET.  If K+ low, consider changing HCTZ to spironolactone and d/c K+ supplement. 3. Hypertension:  Good control. Continue current therapy. 4. Hyperlipidemia:  Continue statin. 5. Disposition:  Follow up with Dr. Peter Martinique as needed.   Signed, Richardson Dopp, PA-C  03/28/2013 4:11 PM

## 2013-03-28 NOTE — Patient Instructions (Signed)
Your physician recommends that you have lab work today (basic metabolic panel)  Your physician recommends that you follow-up with Dr. Martinique as needed  Your physician recommends that you continue on your current medications as directed. Please refer to the Current Medication list given to you today.

## 2013-03-29 ENCOUNTER — Ambulatory Visit: Payer: BC Managed Care – PPO | Admitting: Gynecology

## 2013-03-29 ENCOUNTER — Telehealth: Payer: Self-pay | Admitting: Gynecology

## 2013-03-29 LAB — BASIC METABOLIC PANEL
BUN: 21 mg/dL (ref 6–23)
CO2: 30 mEq/L (ref 19–32)
Calcium: 9.6 mg/dL (ref 8.4–10.5)
Chloride: 104 mEq/L (ref 96–112)
Creatinine, Ser: 1.2 mg/dL (ref 0.4–1.2)
GFR: 50.96 mL/min — ABNORMAL LOW (ref >60.00)
Glucose, Bld: 101 mg/dL — ABNORMAL HIGH (ref 70–99)
Potassium: 3.9 mEq/L (ref 3.5–5.1)
Sodium: 140 mEq/L (ref 135–145)

## 2013-03-29 NOTE — Telephone Encounter (Signed)
Patient [facility]  Her 2 week reck appointment today, this may have been due  to inclement weather. I called and left a message for patient to call and reschedule.

## 2013-04-01 ENCOUNTER — Telehealth: Payer: Self-pay | Admitting: *Deleted

## 2013-04-01 DIAGNOSIS — E876 Hypokalemia: Secondary | ICD-10-CM

## 2013-04-01 DIAGNOSIS — I10 Essential (primary) hypertension: Secondary | ICD-10-CM

## 2013-04-01 DIAGNOSIS — R002 Palpitations: Secondary | ICD-10-CM

## 2013-04-01 DIAGNOSIS — I493 Ventricular premature depolarization: Secondary | ICD-10-CM

## 2013-04-01 MED ORDER — POTASSIUM CHLORIDE ER 20 MEQ PO TBCR: 20.0000 meq | EXTENDED_RELEASE_TABLET | Freq: Every day | ORAL | Status: DC

## 2013-04-01 MED ORDER — POTASSIUM CHLORIDE ER 10 MEQ PO TBCR
10.0000 meq | EXTENDED_RELEASE_TABLET | Freq: Every day | ORAL | Status: DC
Start: 2013-04-01 — End: 2013-05-27

## 2013-04-01 NOTE — Telephone Encounter (Signed)
Message copied by Michae Kava on Fri Apr 01, 2013  9:56 AM ------      Message from: [place], [place] T      Created: Wed Mar 30, 2013  9:14 AM       K+ is normal.      But, since she was still having some palpitations, I want to try to keep her K+ 4 or higher.      Increase K+ to 30 mEq QD.      Check BMET in 2 weeks.      Have her call us if palpitations continue.  I will schedule an ETT at that point (if palps continue - I discussed this with her at her OV).      Richardson Dopp, PA-C        03/30/2013 9:14 AM ------

## 2013-04-01 NOTE — Telephone Encounter (Signed)
pt notified about her lab results and will have bmet 3/13, will increase K+ to 30 meq daily. Pt asked for me to send in 10 meq along with the 20 meq daily. Pt verbalized Plan of Care .

## 2013-04-04 ENCOUNTER — Ambulatory Visit: Payer: BC Managed Care – PPO | Admitting: Physician Assistant

## 2013-04-06 ENCOUNTER — Encounter: Payer: BC Managed Care – PPO | Admitting: Physician Assistant

## 2013-04-14 ENCOUNTER — Encounter (HOSPITAL_BASED_OUTPATIENT_CLINIC_OR_DEPARTMENT_OTHER): Payer: Self-pay | Admitting: Emergency Medicine

## 2013-04-14 ENCOUNTER — Telehealth: Payer: Self-pay | Admitting: *Deleted

## 2013-04-14 ENCOUNTER — Emergency Department (HOSPITAL_BASED_OUTPATIENT_CLINIC_OR_DEPARTMENT_OTHER)
Admission: EM | Admit: 2013-04-14 | Discharge: 2013-04-14 | Disposition: A | Discharge status: Home / Self Care | Payer: BC Managed Care – PPO | Attending: Emergency Medicine | Admitting: Emergency Medicine

## 2013-04-14 DIAGNOSIS — M129 Arthropathy, unspecified: Secondary | ICD-10-CM | POA: Insufficient documentation

## 2013-04-14 DIAGNOSIS — K219 Gastro-esophageal reflux disease without esophagitis: Secondary | ICD-10-CM | POA: Insufficient documentation

## 2013-04-14 DIAGNOSIS — Z79899 Other long term (current) drug therapy: Secondary | ICD-10-CM | POA: Insufficient documentation

## 2013-04-14 DIAGNOSIS — J45909 Unspecified asthma, uncomplicated: Secondary | ICD-10-CM | POA: Insufficient documentation

## 2013-04-14 DIAGNOSIS — H81399 Other peripheral vertigo, unspecified ear: Secondary | ICD-10-CM | POA: Insufficient documentation

## 2013-04-14 DIAGNOSIS — Z87891 Personal history of nicotine dependence: Secondary | ICD-10-CM | POA: Insufficient documentation

## 2013-04-14 DIAGNOSIS — Z87448 Personal history of other diseases of urinary system: Secondary | ICD-10-CM | POA: Insufficient documentation

## 2013-04-14 DIAGNOSIS — I10 Essential (primary) hypertension: Secondary | ICD-10-CM | POA: Insufficient documentation

## 2013-04-14 DIAGNOSIS — E785 Hyperlipidemia, unspecified: Secondary | ICD-10-CM | POA: Insufficient documentation

## 2013-04-14 HISTORY — DX: Interstitial cystitis (chronic) without hematuria: N30.10

## 2013-04-14 LAB — I-STAT CHEM 8, ED
BUN: 23 mg/dL (ref 6–23)
Calcium, Ion: 1.23 mmol/L (ref 1.13–1.30)
Chloride: 101 mEq/L (ref 96–112)
Creatinine, Ser: 1.1 mg/dL (ref 0.50–1.10)
Glucose, Bld: 129 mg/dL — ABNORMAL HIGH (ref 70–99)
HCT: 35 % — ABNORMAL LOW (ref 36.0–46.0)
Hemoglobin: 11.9 g/dL — ABNORMAL LOW (ref 12.0–15.0)
Potassium: 3.8 mEq/L (ref 3.7–5.3)
Sodium: 141 mEq/L (ref 137–147)
TCO2: 27 mmol/L (ref 0–100)

## 2013-04-14 MED ORDER — MECLIZINE HCL 25 MG PO TABS: 25.0000 mg | ORAL_TABLET | Freq: Three times a day (TID) | ORAL | Status: DC | PRN

## 2013-04-14 MED ORDER — MECLIZINE HCL 25 MG PO TABS
25.0000 mg | ORAL_TABLET | Freq: Once | ORAL | Status: AC
  Administered 2013-04-14: 25 mg via ORAL
  Filled 2013-04-14: qty 1

## 2013-04-14 NOTE — Discharge Instructions (Signed)
Vertigo Vertigo means you feel like you or your surroundings are moving when they are not. Vertigo can be dangerous if it occurs when you are at work, driving, or performing difficult activities.  CAUSES  Vertigo occurs when there is a conflict of signals sent to your brain from the visual and sensory systems in your body. There are many different causes of vertigo, including:  Infections, especially in the inner ear.  A bad reaction to a drug or misuse of alcohol and medicines.  Withdrawal from drugs or alcohol.  Rapidly changing positions, such as lying down or rolling over in bed.  A migraine headache.  Decreased blood flow to the brain.  Increased pressure in the brain from a head injury, infection, tumor, or bleeding. SYMPTOMS  You may feel as though the world is spinning around or you are falling to the ground. Because your balance is upset, vertigo can cause nausea and vomiting. You may have involuntary eye movements (nystagmus). DIAGNOSIS  Vertigo is usually diagnosed by physical exam. If the cause of your vertigo is unknown, your caregiver may perform imaging tests, such as an MRI scan (magnetic resonance imaging). TREATMENT  Most cases of vertigo resolve on their own, without treatment. Depending on the cause, your caregiver may prescribe certain medicines. If your vertigo is related to body position issues, your caregiver may recommend movements or procedures to correct the problem. In rare cases, if your vertigo is caused by certain inner ear problems, you may need surgery. HOME CARE INSTRUCTIONS   Follow your caregiver's instructions.  Avoid driving.  Avoid operating heavy machinery.  Avoid performing any tasks that would be dangerous to you or others during a vertigo episode.  Tell your caregiver if you notice that certain medicines seem to be causing your vertigo. Some of the medicines used to treat vertigo episodes can actually make them worse in some people. SEEK  IMMEDIATE MEDICAL CARE IF:   Your medicines do not relieve your vertigo or are making it worse.  You develop problems with talking, walking, weakness, or using your arms, hands, or legs.  You develop severe headaches.  Your nausea or vomiting continues or gets worse.  You develop visual changes.  A family member notices behavioral changes.  Your condition gets worse. MAKE SURE YOU:  Understand these instructions.  Will watch your condition.  Will get help right away if you are not doing well or get worse. Document Released: 10/30/2004 Document Revised: 04/14/2011 Document Reviewed: 08/08/2010 Exit[employer] Patient Information 2014 [employer].  

## 2013-04-14 NOTE — Telephone Encounter (Signed)
Pt calls today regarding lab work for tomorrow. Pt went to urgent care last night b/c of an isolated dizziness episode she experienced & a chem 8 was drawn. I cancelled tomorrow's lab test.  Pt would like to know if she should go ahead & schedule the stress test in light of this dizzy spell yesterday. States she was not having any pvcs/palpitations. I will forward this to [facility] for review   Horton Chin RN

## 2013-04-14 NOTE — ED Notes (Signed)
C/o dizziness since 1 am, denies CP or SOB. No neuro deficits noted, pt is ambulatory.

## 2013-04-14 NOTE — ED Provider Notes (Signed)
CSN: [id number]     Arrival date & time 04/14/13  0135 History   First MD Initiated Contact with Patient 04/14/13 0147     Chief Complaint  Patient presents with  . Dizziness     (Consider location/radiation/quality/duration/timing/severity/associated sxs/prior Treatment) HPI This is a 63 year old female with a remote history of vertigo. She is here with an episode of dizziness that began less than hour ago while using her laptop. She describes it as a sense of motion as well as feeling off balance. Symptoms are moderate. They have significantly improved spontaneously. She did not take anything for this. Symptoms are worse with movement of the head. There is no associated tinnitus or hearing loss. She denies any focal numbness or weakness. She denies difficulty speaking or understanding. She denies chest pain or shortness of breath. She denies headache. She was able to ambulate in.  Past Medical History  Diagnosis Date  . Palpitations   . GERD (gastroesophageal reflux disease)   . Hypertension   . Mild asthma   . Urethral stricture   . Hyperlipidemia   . Complication of anesthesia     2013 SURGERY vocal cord bothering pt, used smaller ent tube after 2 hernia repair, no problem after  . H/O hiatal hernia   . Frequency of urination   . Hematuria   . Arthritis   . Wears contact lenses   . History of TMJ disorder   . Interstitial cystitis    Past Surgical History  Procedure Laterality Date  . Cesarean section    . Colonoscopy with propofol N/A 08/10/2012    Procedure: COLONOSCOPY WITH PROPOFOL;  Surgeon: Garlan Fair, MD;  Location: [facility];  Service: Endoscopy;  Laterality: N/A;  . Endometrial ablation w/ hydrothermablator  09-27-2002  . Umbilical hernia repair  2013    AND REVISION THE SAME YEAR W/ MESH  . Transthoracic echocardiogram  05-26-2011    MILD LVH/  EF 48-18%/  GRADE I DIASTOLIC DYSFUNCTION/  MILD AR//   06-20-2008  NOMRAL STRESS ECHO  . Wisdom tooth  extraction  AGE 51  . Cystoscopy with urethral dilatation N/A 02/18/2013    Procedure: CYSTOSCOPY WITH URETHRAL DILATATION ( NO BALLOON) AND HYDRODISTENSION;  Surgeon: Alexis Frock, MD;  Location: [facility];  Service: Urology;  Laterality: N/A;   Family History  Problem Relation Age of Onset  . Dementia Mother   . Leukemia Child    History  Substance Use Topics  . Smoking status: Former Smoker -- 0.25 packs/day for 2 years    Types: Cigarettes    Quit date: 02/04/1976  . Smokeless tobacco: Never Used  . Alcohol Use: Yes     Comment: occasional   OB History   Grav Para Term Preterm Abortions TAB SAB Ect Mult Living   3 3 3       2      Review of Systems  All other systems reviewed and are negative.   Allergies  Hydrocodone  Home Medications   Current Outpatient Rx  Name  Route  Sig  Dispense  Refill  . buPROPion (WELLBUTRIN XL) 300 MG 24 hr tablet   Oral   Take 300 mg by mouth every morning.          . Calcium Carbonate-Vitamin D (CALTRATE 600+D) 600-400 MG-UNIT per tablet   Oral   Take 2 tablets by mouth daily.          . cholecalciferol (VITAMIN D) 1000 UNITS tablet   Oral  Take 1,000 Units by mouth daily.         . clobetasol ointment (TEMOVATE) 0.05 %      as needed. APPLY TO AFFECTED AREA TWICE A DAY         . Coenzyme Q10 (CO Q 10) 100 MG CAPS   Oral   Take 1 capsule by mouth daily.         . Evening Primrose Oil 1000 MG CAPS   Oral   Take 2 capsules by mouth daily.           . Flaxseed, Linseed, 1000 MG CAPS   Oral   Take 2 capsules by mouth daily.          . hydrochlorothiazide 25 MG tablet   Oral   Take 25 mg by mouth every morning.          . lidocaine (XYLOCAINE) 5 % ointment   Topical   Apply topically 3 (three) times daily as needed.         Marland Kitchen losartan (COZAAR) 50 MG tablet   Oral   Take 50 mg by mouth every morning.          . metoprolol succinate (TOPROL-XL) 50 MG 24 hr tablet   Oral   Take  2 tablets (100 mg total) by mouth daily. OK TAKE EXTRA 1/2 TAB FOR BREAKTHROUGH PALPITATIONS         . Multiple Vitamin (MULTIVITAMIN WITH MINERALS) TABS   Oral   Take 1 tablet by mouth daily.         Marland Kitchen omeprazole (PRILOSEC OTC) 20 MG tablet   Oral   Take 20 mg by mouth every morning.          . potassium chloride (K-DUR) 10 MEQ tablet   Oral   Take 1 tablet (10 mEq total) by mouth daily. Take with the 20 meq tablet daily to = 30 meq daily   30 tablet   11   . Potassium Chloride ER 20 MEQ TBCR   Oral   Take 20 mEq by mouth daily. Take 20 meq and 10 meq to = 30 meq daily   30 tablet   11   . PREMARIN vaginal cream      Apply 2 times a week         . Probiotic Product (ALIGN PO)   Oral   Take 1 capsule by mouth daily.          . simvastatin (ZOCOR) 40 MG tablet   Oral   Take 40 mg by mouth every morning.         . zolpidem (AMBIEN) 10 MG tablet      TAKE 1/2 TABLET AT BEDTIME AS NEEDED FOR SLEEP   20 tablet   0   . acetaminophen (TYLENOL) 500 MG tablet   Oral   Take 1,000 mg by mouth every 6 (six) hours as needed. For pain          . cephALEXin (KEFLEX) 500 MG capsule   Oral   Take 1 capsule (500 mg total) by mouth 4 (four) times daily. Take QID for 7 days.   28 capsule   0    BP 152/66  Pulse 66  Temp(Src) 98.2 F (36.8 C) (Oral)  Resp 18  Ht 5\' 4"  (1.626 m)  Wt 153 lb (69.4 kg)  BMI 26.25 kg/m2  SpO2 100%  Physical Exam General: Well-developed, well-nourished female in no acute distress; appearance consistent with age  of record HENT: normocephalic; atraumatic; right TM normal, left TM obscured by cerumen Eyes: pupils equal, round and reactive to light; extraocular muscles intact; no nystagmus Neck: supple Heart: regular rate and rhythm Lungs: clear to auscultation bilaterally Abdomen: soft; nondistended; nontender Extremities: No deformity; full range of motion; pulses normal Neurologic: Awake, alert and oriented; motor function  intact in all extremities and symmetric; no facial droop; normal coordination and speech; Skin: Warm and dry Psychiatric: Normal mood and affect    ED Course  Procedures (including critical care time)    MDM   Nursing notes and vitals signs, including pulse oximetry, reviewed.  Summary of this visit's results, reviewed by myself:  Labs:  Results for orders placed during the hospital encounter of 04/14/13 (from the past 24 hour(s))  I-STAT CHEM 8, ED     Status: Abnormal   Collection Time    04/14/13  2:12 AM      Result Value Ref Range   Sodium 141  137 - 147 mEq/L   Potassium 3.8  3.7 - 5.3 mEq/L   Chloride 101  96 - 112 mEq/L   BUN 23  6 - 23 mg/dL   Creatinine, Ser 1.10  0.50 - 1.10 mg/dL   Glucose, Bld 129 (*) 70 - 99 mg/dL   Calcium, Ion 1.23  1.13 - 1.30 mmol/L   TCO2 27  0 - 100 mmol/L   Hemoglobin 11.9 (*) 12.0 - 15.0 g/dL   HCT 35.0 (*) 36.0 - 46.0 %   2:22 AM Feels better after meclizine.    Wynetta Fines, MD 04/14/13 [phone]

## 2013-04-15 ENCOUNTER — Other Ambulatory Visit: Payer: BC Managed Care – PPO

## 2013-04-15 NOTE — Telephone Encounter (Signed)
Ok to schedule ETT if she has continued to palpitations as we previously discussed. Richardson Dopp, PA-C   04/15/2013 2:13 PM

## 2013-04-15 NOTE — Telephone Encounter (Signed)
Pt states she has not been having any palpitations & no dizzy spells. She will call back when she would like to have the ETT. Horton Chin RN

## 2013-04-17 NOTE — Telephone Encounter (Signed)
[address] [place], [place]   04/17/2013 10:29 PM

## 2013-04-18 ENCOUNTER — Ambulatory Visit (INDEPENDENT_AMBULATORY_CARE_PROVIDER_SITE_OTHER): Payer: BC Managed Care – PPO | Admitting: Gynecology

## 2013-04-18 VITALS — BP 138/84 | HR 66 | Resp 18 | Ht 64.0 in | Wt 156.0 lb

## 2013-04-18 DIAGNOSIS — L439 Lichen planus, unspecified: Secondary | ICD-10-CM

## 2013-04-18 DIAGNOSIS — N3010 Interstitial cystitis (chronic) without hematuria: Secondary | ICD-10-CM

## 2013-04-18 MED ORDER — PHENAZOPYRIDINE HCL 200 MG PO TABS: 200.0000 mg | ORAL_TABLET | Freq: Three times a day (TID) | ORAL | Status: DC | PRN

## 2013-04-18 MED ORDER — CLOBETASOL PROPIONATE 0.05 % EX OINT: TOPICAL_OINTMENT | CUTANEOUS | Status: DC | PRN

## 2013-04-18 MED ORDER — LIDOCAINE HCL 2 % EX GEL: 1.0000 "application " | CUTANEOUS | Status: DC | PRN

## 2013-04-18 NOTE — Progress Notes (Signed)
Subjective:     Patient ID: Cynthia Peters, female   DOB: Nov 24, 1950, 64 y.o.   MRN: [id number]  HPI Comments: Pt here for f/u LP and issues with intimacy.  Pt is dating a had sex twice with new partner.  Pt states that it was painful and was able to but reports questionable IC flare, low pelvic pain and cramping.  Used cocoanut oil.  Pt is out on disability and will be retired at the end of the month.  Pt had anal sex without pain most recently.    Review of Systems  Constitutional: Negative for fever and chills.       Objective:   Physical Exam  Constitutional: She is oriented to person, place, and time. She appears well-developed and well-nourished.  Genitourinary: Vagina normal and uterus normal. There is no rash, tenderness, lesion or injury on the right labia. There is no rash, tenderness, lesion or injury on the left labia. Cervix exhibits no motion tenderness. Right adnexum displays no mass and no tenderness. Left adnexum displays no mass and no tenderness. No signs of injury around the vagina. No vaginal discharge found.  Neurological: She is alert and oriented to person, place, and time.  Skin: Skin is warm and dry.       Assessment:     Lichen planus  IC Atrophic vaginosis    Plan:     Doing well refill temovate, and lidocaine jelly Try pyridium con't cocoanut oils Continue prn  Reminded overdue for annual

## 2013-04-19 ENCOUNTER — Ambulatory Visit (INDEPENDENT_AMBULATORY_CARE_PROVIDER_SITE_OTHER): Payer: BC Managed Care – PPO

## 2013-04-19 ENCOUNTER — Encounter: Payer: Self-pay | Admitting: Podiatry

## 2013-04-19 ENCOUNTER — Ambulatory Visit (INDEPENDENT_AMBULATORY_CARE_PROVIDER_SITE_OTHER): Payer: BC Managed Care – PPO | Admitting: Podiatry

## 2013-04-19 VITALS — BP 133/80 | HR 77 | Resp 16

## 2013-04-19 DIAGNOSIS — S92309A Fracture of unspecified metatarsal bone(s), unspecified foot, initial encounter for closed fracture: Secondary | ICD-10-CM

## 2013-04-19 NOTE — Progress Notes (Signed)
5th met  Left foot feels ok, hurts a tiny bit. I continue use of bone stimulator on a regular basis.  Objective: Vital signs are stable she is alert and oriented x3 she has mild tenderness on palpation of the fifth metatarsal base of the left foot. Much decrease in edema no erythema saline is drainage or odor. Pulses are palpable left foot. Radiographic evaluation demonstrates a completely healed fifth metatarsal fracture very nice consolidation it appears to have an area of sclerosis consistent with good bone density.  Assessment: Well-healing fracture fifth metatarsal of the left foot. Mild residual pain.  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

## 2013-04-20 ENCOUNTER — Ambulatory Visit: Payer: BC Managed Care – PPO | Admitting: Gynecology

## 2013-05-01 ENCOUNTER — Other Ambulatory Visit: Payer: Self-pay | Admitting: Gynecology

## 2013-05-04 NOTE — Telephone Encounter (Signed)
eScribe request from [facility] for refill on NYSTATIN SUSPENSION Last filled - 10/13/12, 60 ML Please advise refills.

## 2013-05-05 ENCOUNTER — Telehealth: Payer: Self-pay | Admitting: Gynecology

## 2013-05-05 NOTE — Telephone Encounter (Signed)
Pt is requesting refills for Ambien she will be completely out by the weekend. [facility] [place].

## 2013-05-05 NOTE — Telephone Encounter (Signed)
[facility], pt has refills available at pharmacy, but insurance requires a prior authorization.  Message left for pt to return call on Monday (office closed on Friday).  Advised she has refills at pharmacy, but she will need to pay out of pocket until PA is received.

## 2013-05-05 NOTE — Telephone Encounter (Signed)
Last AEX 11/27/2011  Last refill 03/14/2013 #20 / 0 refills No future appt scheduled.  Dr. Charlies Constable Off today will send to Dr. Sabra Heck.  Please approve or deny Rx.

## 2013-05-06 NOTE — Telephone Encounter (Signed)
Did Dr. Charlies Constable get PA as I didn't get this on Thurs, 4/2.  Just checking.

## 2013-05-09 NOTE — Telephone Encounter (Signed)
Requested PA from [facility] at this time via fax.    [phone]  ID Number: [id number]

## 2013-05-09 NOTE — Telephone Encounter (Signed)
Prior authorization to Dr. Charlies Constable to sign.

## 2013-05-09 NOTE — Telephone Encounter (Signed)
Faxed prior auth to [facility] with fax confirmation received.

## 2013-05-09 NOTE — Telephone Encounter (Signed)
Olivia Mackie, has PA process been started?

## 2013-05-25 ENCOUNTER — Other Ambulatory Visit: Payer: Self-pay | Admitting: Gynecology

## 2013-05-25 NOTE — Telephone Encounter (Signed)
Last OV and refill 04/18/13 #20/0 refills  Please approve or deny Rx.

## 2013-05-27 ENCOUNTER — Ambulatory Visit (INDEPENDENT_AMBULATORY_CARE_PROVIDER_SITE_OTHER): Payer: BC Managed Care – PPO | Admitting: Gynecology

## 2013-05-27 ENCOUNTER — Encounter: Payer: Self-pay | Admitting: Gynecology

## 2013-05-27 VITALS — BP 118/66 | HR 64 | Resp 18 | Wt 153.0 lb

## 2013-05-27 DIAGNOSIS — R300 Dysuria: Secondary | ICD-10-CM

## 2013-05-27 DIAGNOSIS — L439 Lichen planus, unspecified: Secondary | ICD-10-CM

## 2013-05-27 DIAGNOSIS — L293 Anogenital pruritus, unspecified: Secondary | ICD-10-CM

## 2013-05-27 DIAGNOSIS — N766 Ulceration of vulva: Secondary | ICD-10-CM

## 2013-05-27 DIAGNOSIS — N898 Other specified noninflammatory disorders of vagina: Secondary | ICD-10-CM

## 2013-05-27 LAB — POCT WET PREP (WET MOUNT): Clue Cells Wet Prep Whiff POC: POSITIVE

## 2013-05-27 LAB — POCT URINALYSIS DIPSTICK
Bilirubin, UA: NEGATIVE
Glucose, UA: NEGATIVE
Ketones, UA: NEGATIVE
Nitrite, UA: NEGATIVE
Protein, UA: NEGATIVE
Urobilinogen, UA: NEGATIVE
pH, UA: 5

## 2013-05-27 MED ORDER — CIPROFLOXACIN HCL 500 MG PO TABS: 500.0000 mg | ORAL_TABLET | Freq: Two times a day (BID) | ORAL | Status: DC

## 2013-05-27 NOTE — Telephone Encounter (Signed)
Pt is not sure if she is having symptoms of a yeast infection or the problem Dr Charlies Constable has been treating her for. Does not know if she needs an appointment or if something can be called in for her.

## 2013-05-27 NOTE — Progress Notes (Signed)
Pt here reporting recent sexual activity and now reports some burning with urination and a deep itch inside the vagina.  Pt has been more sexually active recently.  Pt urinates after sex.  Pt had been taking pyridium for postcoital dysuria.  Pt last had sex 3d ago, symptoms started more this am.  Pt has been using the temovate, no recent lesions of LP.    ROS: per HPI BP 118/66  Pulse 64  Resp 18  Wt 153 lb (69.4 kg) General appearance: alert, cooperative and appears stated age Lymph nodes: Inguinal adenopathy: absent  Pelvic: External genitalia:  See picture              Urethra:  normal appearing urethra with no masses, tenderness or lesions              Bartholins and Skenes: normal                 Vagina:greyish discharge, no LP lesions              Cervix: normal appearance, friable, no CMT                    Bimanual Exam:  Uterus:  uterus is normal size, shape, consistency and nontender                                      Adnexa: normal adnexa in size, nontender and no masses                                      Anus:  normal sphincter tone, see pic     WP: TNTC WBC, no trich, yeast or clue  A/P: Leukorrhea:  GC/CTM from cervix HSV IGG/IGM blood Vulvar lesions:  Will use lidocaine jelly as needed pending lab results, sitz baths as needed.  Pt shown area of concern Dysuria: Urine culture Will start cipro for now pending results

## 2013-05-27 NOTE — Telephone Encounter (Signed)
Spoke with patient. Patient states that she has been experiencing a lot of itching over the past two days. Patient states that Dr.Lathrop is treating her for lichen planus which causes her itching but has used cream prescribed and lidocaine cream and does not have relief. States itching is getting worse. Itching is "more internally and a little externally." Patient has had light discharge for two days but states she cant make out a color due to taking pyridium. Denies abdominal pain, fevers, and urinary symptoms. Offered office visit with Dr.Lathrop today. Patient agreeable. Appointment scheduled for today at 1300 with Dr.Lathrop.  Routing to provider for final review. Patient agreeable to disposition. Will close encounter.

## 2013-05-29 LAB — URINE CULTURE: Colony Count: 6000

## 2013-05-30 ENCOUNTER — Encounter: Payer: Self-pay | Admitting: Gynecology

## 2013-05-30 DIAGNOSIS — B009 Herpesviral infection, unspecified: Secondary | ICD-10-CM

## 2013-05-30 LAB — HSV(HERPES SMPLX)ABS-I+II(IGG+IGM)-BLD
HSV 1 Glycoprotein G Ab, IgG: 0.14 IV
HSV 2 Glycoprotein G Ab, IgG: 5.37 IV — ABNORMAL HIGH
Herpes Simplex Vrs I&II-IgM Ab (EIA): 0.26 INDEX

## 2013-05-30 MED ORDER — VALACYCLOVIR HCL 1 G PO TABS: 1000.0000 mg | ORAL_TABLET | Freq: Two times a day (BID) | ORAL | Status: DC

## 2013-05-30 MED ORDER — LIDOCAINE 5 % EX OINT: TOPICAL_OINTMENT | Freq: Three times a day (TID) | CUTANEOUS | Status: DC | PRN

## 2013-05-30 MED ORDER — VALACYCLOVIR HCL 500 MG PO TABS: 500.0000 mg | ORAL_TABLET | Freq: Two times a day (BID) | ORAL | Status: DC

## 2013-05-30 NOTE — Telephone Encounter (Signed)
Pt notified of +HSV IgG results and negative urine culture.  GC/CTM still pending.  Pt had negative HSV in 10/14.  Pt upset, this is the first partner in years and they were not using condoms.  We reivewed the natural course of HSV infections, she can assume that her boyfriend also +HSV and can be unaware of infection.  We discussed treatment now, she is using the lidocaine 2% but states that the 5% ointment worked better, she would like a refill of that as well.  We discussed either daily suppression or intermittent treatment with outbreak but if she has more than 5, would recommend suppression. Questions addressed

## 2013-05-31 ENCOUNTER — Encounter: Payer: Self-pay | Admitting: Gynecology

## 2013-05-31 LAB — IPS N GONORRHOEA AND CHLAMYDIA BY PCR

## 2013-06-03 ENCOUNTER — Encounter: Payer: Self-pay | Admitting: Gynecology

## 2013-06-03 ENCOUNTER — Ambulatory Visit (INDEPENDENT_AMBULATORY_CARE_PROVIDER_SITE_OTHER): Payer: BC Managed Care – PPO | Admitting: Gynecology

## 2013-06-03 VITALS — BP 110/80 | HR 64 | Resp 16 | Ht 64.0 in

## 2013-06-03 DIAGNOSIS — R300 Dysuria: Secondary | ICD-10-CM

## 2013-06-03 DIAGNOSIS — B009 Herpesviral infection, unspecified: Secondary | ICD-10-CM

## 2013-06-03 LAB — POCT URINALYSIS DIPSTICK
Bilirubin, UA: NEGATIVE
Blood, UA: NEGATIVE
Glucose, UA: NEGATIVE
Ketones, UA: NEGATIVE
Nitrite, UA: NEGATIVE
Protein, UA: NEGATIVE
Urobilinogen, UA: NEGATIVE
pH, UA: 7

## 2013-06-03 MED ORDER — VALACYCLOVIR HCL 1 G PO TABS: 1000.0000 mg | ORAL_TABLET | Freq: Two times a day (BID) | ORAL | Status: DC

## 2013-06-03 MED ORDER — LIDOCAINE 5 % EX OINT: TOPICAL_OINTMENT | Freq: Three times a day (TID) | CUTANEOUS | Status: DC | PRN

## 2013-06-03 NOTE — Patient Instructions (Signed)
Apply A&D to affected area Can urinate in toilet sitz bath Blow dry on cool setting

## 2013-06-03 NOTE — Progress Notes (Signed)
Subjective:     Patient ID: Cynthia Peters, female   DOB: 1950/09/16, 63 y.o.   MRN: [id number]  HPI Comments: Pt here for follow up.  Pt was diagnosed with HSV last visit and is using vatrex 1g BID and xylocaine jelly for dysruria and pain.  Last night the dysruia was worse and she was going out of town in 3d.  Pt reports pain with sitting, she is doing sitz baths.  She brought in here results from dermatology-HSV was culture and not blood.      Review of Systems  Constitutional: Positive for fatigue (general malaise). Negative for fever and chills.  Genitourinary: Positive for dysuria. Negative for frequency, hematuria, flank pain and vaginal discharge.       Objective:   Physical Exam  Nursing note and vitals reviewed. Constitutional: She is oriented to person, place, and time. She appears well-developed and well-nourished.  Genitourinary: Vagina normal and uterus normal.    There is no rash, tenderness, lesion or injury on the right labia. There is no rash, tenderness, lesion or injury on the left labia. Cervix exhibits no motion tenderness. Right adnexum displays no mass and no tenderness. Left adnexum displays no mass and no tenderness. No signs of injury around the vagina. No vaginal discharge found.  Neurological: She is alert and oriented to person, place, and time.  Skin: Skin is warm and dry.  no lesions in vagina     Assessment:     HSV infection     Plan:     Recent urine culture negative, will resend Refill valtrex 1g Recommend A&D ointment to area Discussed that last HSV was culture of old lesion, cannot determine that this is a new infection, she will discuss with her partner.

## 2013-06-03 NOTE — Addendum Note (Signed)
Addended by: Elroy Channel on: 06/03/2013 03:09 PM   Modules accepted: Orders

## 2013-06-05 LAB — URINE CULTURE: Colony Count: 7000

## 2013-06-14 ENCOUNTER — Other Ambulatory Visit: Payer: Self-pay | Admitting: Gynecology

## 2013-06-15 ENCOUNTER — Encounter: Payer: Self-pay | Admitting: Gynecology

## 2013-06-15 NOTE — Telephone Encounter (Signed)
Last AEX 07/07/12  Last refill Ambien 03/24/13 #20/0 refills Last refill Nystatin 10/13/12 #60ML/0 refills No future appt scheduled.

## 2013-06-16 ENCOUNTER — Other Ambulatory Visit: Payer: Self-pay | Admitting: Gynecology

## 2013-06-16 NOTE — Telephone Encounter (Signed)
Patient returning call to [facility] at lunch

## 2013-06-16 NOTE — Telephone Encounter (Signed)
Left message to call Caitlynne Harbeck at [phone]. 

## 2013-06-16 NOTE — Telephone Encounter (Signed)
Patient is calling for status of "Magic Mouth Wash" refill. Patient thinks she may need to talk to a nurse about this refill.

## 2013-06-17 MED ORDER — NYSTATIN 100000 UNIT/ML MT SUSP: 5.0000 mL | Freq: Four times a day (QID) | OROMUCOSAL | Status: DC

## 2013-06-17 NOTE — Telephone Encounter (Signed)
Spoke with patient. Patient states that she requested a prescription refill through [facility] and was waiting to hear back from [facility]. Patient is requesting refill for NYSTATIN 100,000 UNITS/ML SUSP. Advised would speak with Dr.Lathrop regarding prescription refill. [facility] [place]   Dr.Lathrop, order pending for NYSTATIN 100,000 UNITS/ML SUSP to patient's pharmacy of choice.

## 2013-07-28 ENCOUNTER — Encounter: Payer: Self-pay | Admitting: Podiatry

## 2013-08-02 NOTE — Telephone Encounter (Signed)
I left her a message that Dr. Milinda Pointer said her son would not be able to shadow due to it being a liability.  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

## 2013-09-12 ENCOUNTER — Other Ambulatory Visit: Payer: Self-pay | Admitting: Gastroenterology

## 2013-09-12 DIAGNOSIS — R109 Unspecified abdominal pain: Secondary | ICD-10-CM

## 2013-09-13 ENCOUNTER — Ambulatory Visit (INDEPENDENT_AMBULATORY_CARE_PROVIDER_SITE_OTHER): Payer: BC Managed Care – PPO | Admitting: Gynecology

## 2013-09-13 VITALS — BP 124/74 | HR 66 | Resp 12 | Ht 64.0 in | Wt 155.0 lb

## 2013-09-13 DIAGNOSIS — B009 Herpesviral infection, unspecified: Secondary | ICD-10-CM

## 2013-09-13 DIAGNOSIS — IMO0002 Reserved for concepts with insufficient information to code with codable children: Secondary | ICD-10-CM

## 2013-09-13 DIAGNOSIS — R8281 Pyuria: Secondary | ICD-10-CM

## 2013-09-13 DIAGNOSIS — Z0000 Encounter for general adult medical examination without abnormal findings: Secondary | ICD-10-CM

## 2013-09-13 DIAGNOSIS — R82998 Other abnormal findings in urine: Secondary | ICD-10-CM

## 2013-09-13 DIAGNOSIS — N3010 Interstitial cystitis (chronic) without hematuria: Secondary | ICD-10-CM

## 2013-09-13 LAB — POCT URINALYSIS DIPSTICK
Urobilinogen, UA: NEGATIVE
pH, UA: 5

## 2013-09-13 MED ORDER — VALACYCLOVIR HCL 500 MG PO TABS: 500.0000 mg | ORAL_TABLET | Freq: Two times a day (BID) | ORAL | Status: DC

## 2013-09-13 NOTE — Progress Notes (Signed)
Pt here reporting perirectal and vulvar itching.  Pt denies vaginal discharge.  Pt states that she is having issues with her current partner after she was dx with HSV.  Pt states that he got tested and was negative.  She wonders if her prior partner of 15y may have given her the infection. Pt states that her LP had been stable since she had less stress after retiring this year. Despite that she did use temovate on the lesions last night but came in today to be assessed since she was going out of town tomorrow.  BP 124/74  Pulse 66  Resp 12  Ht 5\' 4"  (1.626 m)  Wt 155 lb (70.308 kg)  BMI 26.59 kg/m2 General appearance: alert, cooperative and appears stated age    Vagina: scant discharge, pH 7, frond on right side wall with red tip, no bleeding, non-tender Cervix no lesions, no CMT Uterus: mobile non-tender  Assessment: H/o HSV, h/o LP with lesion IC  Plan: Reviewed her labs re HSV, elevated IgG HSV 2, neg IgM c/w older infection, stressed that cannot determine length of time of infection other than not newly acquired.  Repeating labs would not be beneficial.  Prolonged steroid exposure may have caused older infection to flare. Pt understands Lesions appear more HSV-like, pt does not have rx for repeat flares, sent in and instructed on use, if she should flare more frequently-this was 37m- could consider suppression.  She thinks her current relationship will be ending based on the HSV and his recent move. Lidocaine jelly as needed to prevent scratching U/a with WBC, will send culture  53m spent counseling, >50% face to face

## 2013-09-14 ENCOUNTER — Ambulatory Visit
Admission: RE | Admit: 2013-09-14 | Discharge: 2013-09-14 | Disposition: A | Discharge status: Home / Self Care | Payer: BC Managed Care – PPO | Source: Ambulatory Visit | Attending: Gastroenterology | Admitting: Gastroenterology

## 2013-09-14 ENCOUNTER — Ambulatory Visit: Payer: BC Managed Care – PPO

## 2013-09-14 DIAGNOSIS — R109 Unspecified abdominal pain: Secondary | ICD-10-CM

## 2013-09-28 ENCOUNTER — Other Ambulatory Visit: Payer: Self-pay | Admitting: Gynecology

## 2013-09-28 NOTE — Telephone Encounter (Signed)
Last AEX 11/27/11 Last refill 06/17/13 #33mL/0R No future appts   Please advise.

## 2013-09-30 ENCOUNTER — Other Ambulatory Visit: Payer: Self-pay

## 2013-09-30 NOTE — Telephone Encounter (Signed)
Disregard. Encounter is a duplicate

## 2013-10-28 ENCOUNTER — Other Ambulatory Visit: Payer: Self-pay | Admitting: Obstetrics & Gynecology

## 2013-10-28 ENCOUNTER — Encounter: Payer: Self-pay | Admitting: Gynecology

## 2013-10-28 DIAGNOSIS — B009 Herpesviral infection, unspecified: Secondary | ICD-10-CM

## 2013-10-28 MED ORDER — VALACYCLOVIR HCL 500 MG PO TABS: ORAL_TABLET | ORAL | Status: DC

## 2013-10-31 ENCOUNTER — Other Ambulatory Visit: Payer: Self-pay

## 2013-10-31 NOTE — Telephone Encounter (Signed)
[facility] Valtrex. Rx was taking care of by Dr. Sabra Heck on 10/28/13 Encounter closed

## 2013-12-05 ENCOUNTER — Encounter: Payer: Self-pay | Admitting: Gynecology

## 2014-02-09 ENCOUNTER — Telehealth: Payer: Self-pay | Admitting: Nurse Practitioner

## 2014-02-09 ENCOUNTER — Encounter: Payer: Self-pay | Admitting: Nurse Practitioner

## 2014-02-09 NOTE — Telephone Encounter (Signed)
Spoke with patient. Appointment scheduled for tomorrow at 2:30pm with Milford Cage, [place]. Advised if Milford Cage, FNP needs anything MD's will be available here in the office tomorrow for evaluation. Patient is agreeable.  Routing to provider for final review. Patient agreeable to disposition. Will close encounter

## 2014-02-09 NOTE — Telephone Encounter (Signed)
Spoke with patient. Patient states that she has a history of herpes and lichen planus. "I usually come in to see Dr.Lathrop when I am having problems and she will tell me which medication I need to take because I am never sure and can't see the area. There is a place that is irritated that just came up and I am not sure which it is." Advised patient will need to be seen in office for evaluation and recommendations. Patient is agreeable. Would like to see MD in office. Advised will need to check scheduling for tomorrow with providers and return call with appointment times to get scheduled. Patient is agreeable.

## 2014-02-09 NOTE — Telephone Encounter (Signed)
Former pt of Dr Charlies Constable. Pt called to schedule with her. Made pt aware of Dr Lathrop's departure. Pt cautious to schedule with anyone else because of her diagnosis of Lichen Planus. Dr Charlies Constable would follow pts flare-ups. Pt thinks she might be having one, but not sure she should come in. Scheduled pt with Patty for her aex. Found opening in February. Pt agreeable to call from nurse to review symptoms and schedule if necessary. Pt is comfortable speaking with Delana Meyer because she was Dr Orpah Greek assistant and knows her history.  Please call pt.  bf

## 2014-02-10 ENCOUNTER — Ambulatory Visit: Payer: BC Managed Care – PPO | Admitting: Nurse Practitioner

## 2014-02-13 ENCOUNTER — Encounter: Payer: Self-pay | Admitting: Nurse Practitioner

## 2014-02-13 ENCOUNTER — Ambulatory Visit (INDEPENDENT_AMBULATORY_CARE_PROVIDER_SITE_OTHER): Payer: BC Managed Care – PPO | Admitting: Nurse Practitioner

## 2014-02-13 VITALS — BP 118/76 | HR 60 | Temp 98.0°F | Resp 20 | Ht 64.0 in | Wt 153.0 lb

## 2014-02-13 DIAGNOSIS — R300 Dysuria: Secondary | ICD-10-CM

## 2014-02-13 LAB — POCT URINALYSIS DIPSTICK
Blood, UA: NEGATIVE
pH, UA: 5

## 2014-02-13 NOTE — Progress Notes (Signed)
Subjective:     Patient ID: Cynthia Peters, female   DOB: 1950/09/18, 64 y.o.   MRN: [id number]  HPI  This 64 yo WD Fe presents with history of Lichen planus and HSV.  About 63-5 days ago started having symptoms of perianal irritation and 'soreness' and itching.   She was unsure if this was related to HSV II as she has not had a flare since diagnosis 63 year ago.  She does maintain on antivirals daily.  This previous partner did have testing done and called 63 months ago and confessed that he does have HSV.  She also has lichen planus and IC.  She is seeing URO PT with Ileana Roup, every other week. For the flare of lichen she usually uses Clobetasol.  She did try one dose ans it seemed to give her relief  Current dating a new partner but not SA yet.  She is due for AEX in February and plans to have all of STD's done then.    Review of Systems  Constitutional: Negative for fever, chills, diaphoresis and fatigue.  Respiratory: Negative.   Cardiovascular: Negative.   Gastrointestinal: Negative.        History of hemorrhoids.  Genitourinary: Positive for dysuria, genital sores, vaginal pain and dyspareunia. Negative for urgency, decreased urine volume, vaginal bleeding, vaginal discharge, menstrual problem and pelvic pain.  Musculoskeletal: Negative.        Recent laceration of right heel and currently on antibiotics with Keflex.  Skin: Negative.   Neurological: Negative.   Psychiatric/Behavioral: Negative.        Objective:   Physical Exam  Constitutional: She is oriented to person, place, and time. She appears well-developed and well-nourished.  Abdominal: Soft. She exhibits no distension and no mass. There is no tenderness. There is no rebound and no guarding.  Genitourinary:     Several atrophic areas at the introitus without flare of Lichen.  There are several external hemorrhoids and one of them has an ulceration consistent with HSV.  Urine: 1+ leuk's  Neurological: She is  alert and oriented to person, place, and time.  Psychiatric: She has a normal mood and affect. Her behavior is normal. Judgment and thought content normal.       Assessment:     History of Lichen Planus without flare History of HSV II with flare R/O UTI    Plan:     Increase Valtrex to 500 mg BID for next 3 days, then back to maintenance dosing daily If symptoms persist to call back Will follow with urine C&S

## 2014-02-13 NOTE — Patient Instructions (Signed)
Genital Herpes °Genital herpes is a sexually transmitted disease. This means that it is a disease passed by having sex with an infected person. There is no cure for genital herpes. The time between attacks can be months to years. The virus may live in a person but produce no problems (symptoms). This infection can be passed to a baby as it travels down the birth canal (vagina). In a newborn, this can cause central nervous system damage, eye damage, or even death. The virus that causes genital herpes is usually HSV-2 virus. The virus that causes oral herpes is usually HSV-1. The diagnosis (learning what is wrong) is made through culture results. °SYMPTOMS  °Usually symptoms of pain and itching begin a few days to a week after contact. It first appears as small blisters that progress to small painful ulcers which then scab over and heal after several days. It affects the outer genitalia, birth canal, cervix, penis, anal area, buttocks, and thighs. °HOME CARE INSTRUCTIONS  °· Keep ulcerated areas dry and clean. °· Take medications as directed. Antiviral medications can speed up healing. They will not prevent recurrences or cure this infection. These medications can also be taken for suppression if there are frequent recurrences. °· While the infection is active, it is contagious. Avoid all sexual contact during active infections. °· Condoms may help prevent spread of the herpes virus. °· Practice safe sex. °· Wash your hands thoroughly after touching the genital area. °· Avoid touching your eyes after touching your genital area. °· Inform your caregiver if you have had genital herpes and become pregnant. It is your responsibility to insure a safe outcome for your baby in this pregnancy. °· Only take over-the-counter or prescription medicines for pain, discomfort, or fever as directed by your caregiver. °SEEK MEDICAL CARE IF:  °· You have a recurrence of this infection. °· You do not respond to medications and are not  improving. °· You have new sources of pain or discharge which have changed from the original infection. °· You have an oral temperature above 102° F (38.9° C). °· You develop abdominal pain. °· You develop eye pain or signs of eye infection. °Document Released: 01/18/2000 Document Revised: 04/14/2011 Document Reviewed: 02/07/2009 °ExitCare® Patient Information ©2015 [employer]. This information is not intended to replace advice given to you by your health care provider. Make sure you discuss any questions you have with your health care provider. ° °

## 2014-02-14 LAB — URINALYSIS, MICROSCOPIC ONLY
Bacteria, UA: NONE SEEN
Casts: NONE SEEN

## 2014-02-14 NOTE — Progress Notes (Signed)
Encounter reviewed by Dr. Nao Linz Silva.  

## 2014-02-15 LAB — URINE CULTURE
Colony Count: NO GROWTH
Organism ID, Bacteria: NO GROWTH

## 2014-02-16 ENCOUNTER — Encounter: Payer: Self-pay | Admitting: Nurse Practitioner

## 2014-03-01 ENCOUNTER — Ambulatory Visit (INDEPENDENT_AMBULATORY_CARE_PROVIDER_SITE_OTHER): Payer: BC Managed Care – PPO | Admitting: Podiatry

## 2014-03-01 ENCOUNTER — Ambulatory Visit (INDEPENDENT_AMBULATORY_CARE_PROVIDER_SITE_OTHER): Payer: BC Managed Care – PPO

## 2014-03-01 ENCOUNTER — Encounter: Payer: Self-pay | Admitting: Podiatry

## 2014-03-01 VITALS — BP 138/82 | HR 18 | Resp 63

## 2014-03-01 DIAGNOSIS — T148 Other injury of unspecified body region: Secondary | ICD-10-CM

## 2014-03-01 DIAGNOSIS — IMO0002 Reserved for concepts with insufficient information to code with codable children: Secondary | ICD-10-CM

## 2014-03-01 DIAGNOSIS — R52 Pain, unspecified: Secondary | ICD-10-CM

## 2014-03-01 NOTE — Patient Instructions (Signed)
Monitor for any signs/symptoms of infection. Call the office immediately if any occur or go directly to the emergency room. Call with any questions/concerns.  

## 2014-03-01 NOTE — Progress Notes (Signed)
   Subjective:    Patient ID: Cynthia Peters, female    DOB: 18-Aug-1950, 65 y.o.   MRN: [id number]  HPI  64 year old female presents the office today with complaints of pain to the back of her right heel. She states while she was in [place], [place] she had an automatic door hit the back of her heel which caused a laceration. At that time she went to the emergency department with the area was sutured. She was not placing any immobilization device were given antibiotics. She states that she later developed some redness and had increased pain to the area for which she saw her primary care physician and is placed her on antibiotics for which she started yesterday. She states that the redness has significantly improved although she does continue to have pain overlying the area. She is inquiring about a possible MRI. Plain to this time.   Review of Systems  All other systems reviewed and are negative.      Objective:   Physical Exam AAO 3, NAD DP/PT pulses palpable bilaterally, CRT less than 3 seconds Protective sensation intact with Simms Weinstein monofilament, vibratory sensation intact, Achilles tendon reflex intact. Scar overlying the posterior aspect of the right heel and the distal aspect of the Achilles tendon. This is from the area of laceration. There is tenderness overlying this area and the small scab formation. There is no significant erythema or ascending cellulitis. There is no overlying fluctuance or crepitus. Thompson test was performed and was negative. There is not appear to be any defects or tearing within the Achilles tendon. There is mild pain overlying the area with dorsiflexion plantarflexion of the ankle. No other areas of tenderness to bilateral lower extremities. MMT 5/5, ROM WNL No other open lesions or pre-ulcerative lesions are identified. No pain with calf compression, swelling, warmth, erythema.        Assessment & Plan:  64 year old female status  post right posterior heel distal Achilles laceration with continued pain; currently, no clinical signs of infection  -X-rays were obtained and reviewed the patient. -Treatment options were discussed with the patient clearing alternatives, risks, complications. -Finish course of antibiotics which was prescribed to her by primary care physician. -Recommended immobilization in a Cam Walker. CRT has a boot at home recommended he use this. Also dispensed foam offloading pads to help protect the area to ensure does not rub in the boot. -Continue to monitor the area. At this time as she showed me pictures of what the area looked like previously compared to now redness has significantly improved and the Achilles tendon does appear to be intact. We'll continue to monitor. If over the next week or so she continues to have symptoms will consider an MRI. -Follow-up in 1 week or sooner should any problems arise. In the meantime, encouraged to call the office with any questions, concerns, change in symptoms.

## 2014-03-06 ENCOUNTER — Ambulatory Visit (INDEPENDENT_AMBULATORY_CARE_PROVIDER_SITE_OTHER): Payer: BC Managed Care – PPO | Admitting: Podiatry

## 2014-03-06 VITALS — BP 138/84 | HR 74 | Resp 16

## 2014-03-06 DIAGNOSIS — S91312D Laceration without foreign body, left foot, subsequent encounter: Secondary | ICD-10-CM

## 2014-03-06 NOTE — Progress Notes (Signed)
She presents today for follow-up of her left heel with laceration. She states this seems to be swelling more and is mildly tender in the Achilles tendon area. She denies fever chills nausea vomiting muscle aches and pains. She states that she has not been utilizing the cam walker.  Objective: Venous insufficiency to the left lateral foot the Achilles appears to be intact and of normal size. Achilles appears to be of normal strength.  Assessment: Healing laceration posterior aspect of the Achilles Rule out a deep infection however does appear to be simply edematous.  Plan: Moderate edema left ankle. We placed her in a compression anklet and encouraged the use of her cam walker. If she is no better in 2 weeks we will get an MRI.

## 2014-03-08 ENCOUNTER — Ambulatory Visit: Payer: BC Managed Care – PPO | Admitting: Podiatry

## 2014-03-09 ENCOUNTER — Ambulatory Visit: Payer: BC Managed Care – PPO | Admitting: Podiatry

## 2014-03-10 ENCOUNTER — Ambulatory Visit: Payer: BC Managed Care – PPO | Admitting: Nurse Practitioner

## 2014-03-10 ENCOUNTER — Encounter: Payer: Self-pay | Admitting: Nurse Practitioner

## 2014-03-10 ENCOUNTER — Ambulatory Visit (INDEPENDENT_AMBULATORY_CARE_PROVIDER_SITE_OTHER): Payer: BC Managed Care – PPO | Admitting: Nurse Practitioner

## 2014-03-10 ENCOUNTER — Telehealth: Payer: Self-pay | Admitting: Nurse Practitioner

## 2014-03-10 VITALS — BP 128/70 | HR 68 | Resp 18 | Ht 64.0 in | Wt 153.0 lb

## 2014-03-10 DIAGNOSIS — L292 Pruritus vulvae: Secondary | ICD-10-CM

## 2014-03-10 DIAGNOSIS — B373 Candidiasis of vulva and vagina: Secondary | ICD-10-CM

## 2014-03-10 DIAGNOSIS — K6289 Other specified diseases of anus and rectum: Secondary | ICD-10-CM

## 2014-03-10 DIAGNOSIS — B3731 Acute candidiasis of vulva and vagina: Secondary | ICD-10-CM

## 2014-03-10 DIAGNOSIS — R8290 Unspecified abnormal findings in urine: Secondary | ICD-10-CM

## 2014-03-10 LAB — POCT URINALYSIS DIPSTICK: pH, UA: 5

## 2014-03-10 MED ORDER — NYSTATIN-TRIAMCINOLONE 100000-0.1 UNIT/GM-% EX OINT: 1.0000 "application " | TOPICAL_OINTMENT | Freq: Two times a day (BID) | CUTANEOUS | Status: DC

## 2014-03-10 MED ORDER — FLUCONAZOLE 150 MG PO TABS: 150.0000 mg | ORAL_TABLET | Freq: Once | ORAL | Status: DC

## 2014-03-10 NOTE — Telephone Encounter (Signed)
Spoke with patient. She has two concerns she would like to discuss with Patty and requests appointment today.  Appointment for Cynthia Peters, [place] at 1530 scheduled and patient agreeable. Routing to provider for final review. Patient agreeable to disposition. Will close encounter

## 2014-03-10 NOTE — Progress Notes (Signed)
64 y.o.  DW Fe G3P2  here with complaint of vaginal symptoms of itching, burning, and increase discharge. Her first symptoms were rectal pain and irritation with itching. Thought she was having a flare of hemorrhoids.  Also concerned that she may have a flare of HSV.  Continues on Valtrex as daily maintenance therapy.  Describes vaginal discharge as white.  Last night more increase in vulvar itching and some tenderness.  On Augmentin since about 1/28 for foot infection.  Some wheezing and voice changes over this past week as well. Onset of symptoms since 2./2/16.  Denies new personal products or vaginal dryness. No STD concerns. Urinary symptoms none except when urine hits the vulvar areas.    O: Healthy female WDWN Affect: normal, orientation x 3  Exam: no acute distress Chest:  No wheezing. Abdomen:  Soft and non tender Lymph node: no enlargement or tenderness Pelvic exam: External genital: no HSV lesions. Typical externally yeast symptoms with thickened skin and redness.   BUS: negative Vagina: thin white discharge noted.  Anal scope:  Pt was placed in left lateral decubitus position with knees in comfortable position.  External anal area with multiple external hemorrhoids.  She has one elongated hemorrhoid that is quite red.  Anal scope is inserted and revels several internal hemorrhoids without flare.  No rectal bleeding and no other mass. Pt. Tolerated procedure well.   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 Discussed Aveeno or baking soda sitz bath for comfort. Avoid moist clothes or pads for extended period of time. If working out in gym clothes or swim suits for long periods of time change  underwear or bottoms of swimsuit if possible. Olive Oil use for skin protection prior to activity can be used to external skin.  Rx: Diflucan 150 mg X 2  RV prn

## 2014-03-10 NOTE — Telephone Encounter (Signed)
Pt called and left voicemail stating she needs to see Ms Chong Sicilian today. She has two issues that present similarly, but she needs to see Ms Chong Sicilian to know which issue it is so she can treat it properly.   Pt requests appt today.  Best: [phone]

## 2014-03-12 LAB — URINE CULTURE
Colony Count: NO GROWTH
Organism ID, Bacteria: NO GROWTH

## 2014-03-12 NOTE — Progress Notes (Signed)
Encounter reviewed by Dr. Jaecion Dempster Silva.  

## 2014-03-13 ENCOUNTER — Encounter: Payer: Self-pay | Admitting: Nurse Practitioner

## 2014-03-21 ENCOUNTER — Ambulatory Visit (INDEPENDENT_AMBULATORY_CARE_PROVIDER_SITE_OTHER): Payer: BC Managed Care – PPO | Admitting: Podiatry

## 2014-03-21 VITALS — BP 123/80 | HR 60 | Resp 16

## 2014-03-21 DIAGNOSIS — S91312D Laceration without foreign body, left foot, subsequent encounter: Secondary | ICD-10-CM

## 2014-03-21 NOTE — Progress Notes (Signed)
The laceration to the left Achilles tendon and skin appears to be doing much better she says. She wears her compression anklet but has not been wearing her Cam Walker.  Objective: Vital signs are stable she is alert and oriented 3. There is no erythema edema cellulitis drainage or odor on physical exam today. The wound has completely healed and minimal fibrosis is noted. There is no signs of infection. She has no tenderness on palpation of the Achilles tendon. She has full range of motion of the foot with plantar flexion against resistance.  Assessment: Well-healing laceration skin left.  Plan: Continue massage therapy and compression I will follow-up with her as needed.

## 2014-03-29 ENCOUNTER — Telehealth: Payer: Self-pay | Admitting: Nurse Practitioner

## 2014-03-29 NOTE — Telephone Encounter (Signed)
Left patient a message to call back to reschedule her AEX from 05/19/14.

## 2014-04-24 ENCOUNTER — Telehealth: Payer: Self-pay | Admitting: Nurse Practitioner

## 2014-04-24 NOTE — Telephone Encounter (Signed)
Pt states she sees Patty regarding rash. Pt states rash changes/increase since Friday. Pt requests appointment today if possible. Chart to tracy.

## 2014-04-24 NOTE — Telephone Encounter (Signed)
1134: Spoke with patient. She states she has a hx of lichen planus as well as HSV. She has concerns about a rash that that started on Friday when she was in [place]. She states she "itched my back over and over really hard." She felt she was being bit by an insect while she was in [place] called "Noseeums." Patient states that she is unable to visualize area, but states that what she can see is red and right above her buttocks. It is causing her to itch. She is scratching the area as well. She is using Vitamin E oil to apply to skin without relief. Patient is offered appointment tomorrow with Milford Cage, FNP at 0900. She agrees. Requests that Milford Cage, FNP give instructions at this time if possible.   Advised would return call if Milford Cage, FNP has instructions for patient.

## 2014-04-25 ENCOUNTER — Ambulatory Visit (INDEPENDENT_AMBULATORY_CARE_PROVIDER_SITE_OTHER): Payer: BC Managed Care – PPO | Admitting: Nurse Practitioner

## 2014-04-25 ENCOUNTER — Encounter: Payer: Self-pay | Admitting: Nurse Practitioner

## 2014-04-25 VITALS — BP 96/64 | HR 60 | Temp 98.1°F | Ht 64.0 in | Wt 154.0 lb

## 2014-04-25 DIAGNOSIS — B009 Herpesviral infection, unspecified: Secondary | ICD-10-CM

## 2014-04-25 MED ORDER — VALACYCLOVIR HCL 500 MG PO TABS: ORAL_TABLET | ORAL | Status: DC

## 2014-04-25 NOTE — Progress Notes (Signed)
Subjective:     Patient ID: Cynthia Peters, female   DOB: July 22, 1950, 64 y.o.   MRN: [id number]  HPI  This  64 y.o.DW Fe G3P2  Presents with history of intense itching over the lower buttock and left perirectal area since Friday.  Then noted a rash later on Sunday.  She restarted her Valtrex at 500 mg BID.   No underlying pain at this region.  Main symptoms now is itching.  She has a history of lichen in the mouth and is now having a flare and using Magic Mouth Wash.   No bladder or urinary issues.  She thinks the area of blisters is now drying up some.   Review of Systems  Constitutional: Negative for fever, chills and fatigue.  Respiratory: Negative.   Cardiovascular: Negative.   Gastrointestinal: Negative.   Genitourinary: Positive for genital sores.  Musculoskeletal: Negative.   Skin: Negative.   Neurological: Negative.   Psychiatric/Behavioral: Negative.        Objective:   Physical Exam  Constitutional: She is oriented to person, place, and time. She appears well-developed and well-nourished.  Abdominal: Soft. She exhibits no distension and no mass. There is no tenderness. There is no rebound and no guarding.  Genitourinary:  Series of multiple linear blisters at the rectal folds between the buttocks.  A culture is taken but the areas are dried in most paces.  Still looks consistent with HSV.  She has a lesion left perirectal area. Vaginal exam without lesions or discharge.  No flare of lichen at the vulvar areas.  Neurological: She is alert and oriented to person, place, and time.  Skin: Skin is warm and dry.  Psychiatric: She has a normal mood and affect. Her behavior is normal. Judgment and thought content normal.       Assessment:     History of HSV Looks like new lesions between buttocks that are drying    Plan:     Will increase Valtrex to 1 gm BID for 3 days until lesions are gone.   She may use Aveeno prn for comfort.

## 2014-04-25 NOTE — Patient Instructions (Addendum)
Increase Valtrex 500 mg at 2 tablets 1000 mg twice a day for 3 days

## 2014-04-27 LAB — HERPES SIMPLEX VIRUS CULTURE: Organism ID, Bacteria: NOT DETECTED

## 2014-04-29 ENCOUNTER — Other Ambulatory Visit: Payer: Self-pay | Admitting: Physician Assistant

## 2014-04-30 NOTE — Progress Notes (Signed)
Encounter reviewed by Dr. Ericka Marcellus Silva.  

## 2014-05-11 ENCOUNTER — Ambulatory Visit (INDEPENDENT_AMBULATORY_CARE_PROVIDER_SITE_OTHER): Payer: BC Managed Care – PPO

## 2014-05-11 ENCOUNTER — Ambulatory Visit (INDEPENDENT_AMBULATORY_CARE_PROVIDER_SITE_OTHER): Payer: BC Managed Care – PPO | Admitting: Podiatry

## 2014-05-11 ENCOUNTER — Encounter: Payer: Self-pay | Admitting: Podiatry

## 2014-05-11 VITALS — BP 124/68 | HR 64 | Resp 16

## 2014-05-11 DIAGNOSIS — M722 Plantar fascial fibromatosis: Secondary | ICD-10-CM | POA: Diagnosis not present

## 2014-05-11 DIAGNOSIS — M79672 Pain in left foot: Secondary | ICD-10-CM | POA: Diagnosis not present

## 2014-05-11 DIAGNOSIS — M779 Enthesopathy, unspecified: Secondary | ICD-10-CM

## 2014-05-11 NOTE — Progress Notes (Signed)
She presents today concerned of swelling to her left foot and pain to the fifth metatarsal of the left foot. She states this starting to develop symptoms of the fracture like I had before. She denies any trauma to the foot.  Objective: Vital signs are stable she's alert and oriented 3. After reviewing the radiographs I do not see any type of fracture present. She does have pain on palpation of the styloid process of the fifth metatarsal but she also has pain on palpation medial calcaneal tubercle of the left heel as well as fluctuance within the posterior tibial tendon sheath.  Assessment: I feel that she has plantar fasciitis with lateral compensatory syndrome resulting in some swelling and pain along the fifth metatarsal.  Plan: Injected her left heel today encouraged her to utilize her Cam Gilford Rile that she has at home for 1 week. I will follow up with her in a couple of weeks at which time another radiograph will be performed to the left foot.

## 2014-05-19 ENCOUNTER — Ambulatory Visit: Payer: BC Managed Care – PPO | Admitting: Nurse Practitioner

## 2014-05-25 ENCOUNTER — Ambulatory Visit: Payer: BC Managed Care – PPO | Admitting: Podiatry

## 2014-06-02 ENCOUNTER — Other Ambulatory Visit: Payer: Self-pay | Admitting: *Deleted

## 2014-06-02 DIAGNOSIS — N3010 Interstitial cystitis (chronic) without hematuria: Secondary | ICD-10-CM

## 2014-06-02 MED ORDER — PHENAZOPYRIDINE HCL 200 MG PO TABS: 200.0000 mg | ORAL_TABLET | Freq: Three times a day (TID) | ORAL | Status: DC | PRN

## 2014-06-02 NOTE — Telephone Encounter (Signed)
Medication refill request: Phenazopyridine  Last AEX:  ? Next AEX: none Last MMG (if hormonal medication request): ? Refill authorized: 04/18/13 #20tabs/0R. Today please advise.

## 2014-07-18 ENCOUNTER — Other Ambulatory Visit: Payer: Self-pay | Admitting: Nurse Practitioner

## 2014-07-18 NOTE — Telephone Encounter (Signed)
Medication refill request: G.Pyridium 200mg  #20/0 Last AEX:  Never with [place], Problem visits only Next AEX: not scheduled Last MMG (if hormonal medication request): 2006 Bi-Rads 1/neg:[facility] Refill authorized: Please advise  Last refill:  06-02-14 #20/0refills Last OV:  04-25-14

## 2014-07-18 NOTE — Telephone Encounter (Signed)
Please call pt and let her know that we have not done a physical here.  If she had one done at PCP - maybe they cand given her a refill.  We should try an see her first to rule out UTI before a refill on Pyridium.  She could also try OTC AZO to use prn.

## 2014-07-19 NOTE — Telephone Encounter (Signed)
Left message to call Lorece Keach at [phone]. 

## 2014-07-19 NOTE — Telephone Encounter (Signed)
Spoke with patient. Advised of message as seen below from Milford Cage, [place]. Patient states she feels pain is from interstitial cystitis which her urologist diagnosed. Advised will need to be seen in office to rule out UTI before medications can be given. Or may speak with her urologist to see what they recommend since she was seen and diagnosed with their office. Patient will call urologist and return call to schedule an appointment if needed.  Routing to provider for final review. Patient agreeable to disposition.  Unable to close encounter with pending medication.

## 2014-07-21 ENCOUNTER — Ambulatory Visit (INDEPENDENT_AMBULATORY_CARE_PROVIDER_SITE_OTHER): Payer: BC Managed Care – PPO | Admitting: Obstetrics & Gynecology

## 2014-07-21 ENCOUNTER — Telehealth: Payer: Self-pay | Admitting: Nurse Practitioner

## 2014-07-21 VITALS — BP 106/74 | Temp 98.1°F | Ht 64.0 in | Wt 158.2 lb

## 2014-07-21 DIAGNOSIS — R312 Other microscopic hematuria: Secondary | ICD-10-CM | POA: Diagnosis not present

## 2014-07-21 DIAGNOSIS — N3010 Interstitial cystitis (chronic) without hematuria: Secondary | ICD-10-CM

## 2014-07-21 DIAGNOSIS — R300 Dysuria: Secondary | ICD-10-CM | POA: Diagnosis not present

## 2014-07-21 DIAGNOSIS — R3129 Other microscopic hematuria: Secondary | ICD-10-CM

## 2014-07-21 LAB — POCT URINALYSIS DIPSTICK
Nitrite, UA: POSITIVE
Protein, UA: NEGATIVE
Urobilinogen, UA: NEGATIVE
pH, UA: 5

## 2014-07-21 MED ORDER — PHENAZOPYRIDINE HCL 200 MG PO TABS: 200.0000 mg | ORAL_TABLET | Freq: Three times a day (TID) | ORAL | Status: DC | PRN

## 2014-07-21 MED ORDER — FLUCONAZOLE 150 MG PO TABS: 150.0000 mg | ORAL_TABLET | Freq: Once | ORAL | Status: DC

## 2014-07-21 MED ORDER — SULFAMETHOXAZOLE-TRIMETHOPRIM 800-160 MG PO TABS: 1.0000 | ORAL_TABLET | Freq: Two times a day (BID) | ORAL | Status: DC

## 2014-07-21 NOTE — Progress Notes (Signed)
Subjective:     Patient ID: Cynthia Peters, female   DOB: 01/08/1951, 64 y.o.   MRN: [id number]  HPI 64 yo G3P3 DWF here for complaint of increased urinary urgency and some increased pelvic pain.  She wonders if she has a UTI.  Feels she is not completely emptying as well which is not a part of her "typical interstitial cystitis symptoms".  Pt with hx of interstitial cystitis that is followed by Dr. Tresa Moore at [facility].  Reports she is heading to [facility] to see man who she dated last year.  Was diagnosed with HSV which really strained the relationship due to trust issues.  Turns out ex-husband tested positive after pt asked him to do so.  She reports he was unfaithful and this was part of their martial issues.  So, she was relieved.  Ex-boyfriend was not very receptive to the information at first but now has "come around" and so they are spending the week together.  Pt worried about possible increased pain.    Has lichen planus (of mouth and vulva).  States it has been biopsied.  Doing well but does have pain with intercourse.  Questions me about safety of anal intercourse.  Pt uses lubrication and finds this is a good alternative for intimacy for her.  Advised pt, as long as doesn't have active hemorrhoids, and uses good lubrication, this is a choice for her to make about the best way to have intimacy and not for me to tell her what to do.  States she has been told in the past not to be intimate this way by another gynecology.  Again, do not feel that is my decision to make for her.     Reviewed many notes which mention Lichen planus.  Reviewed results section in EPIC and only vulvar biopsy I found is one from 10/14 with Dr. Crista Luria, dermatology, that does NOT clearly show lichen planus or lichen sclerosus.   Review of Systems  All other systems reviewed and are negative.      Objective:   Physical Exam  Constitutional: She is oriented to person, place, and time. She appears  well-developed and well-nourished.  Abdominal: Soft. Bowel sounds are normal.  Genitourinary: Vagina normal.    There is no rash, tenderness, lesion or injury on the right labia. There is no rash, tenderness, lesion or injury on the left labia. No vaginal discharge (but tissue is atrophic in apperance) found.  Lymphadenopathy:       Right: No inguinal adenopathy present.       Left: No inguinal adenopathy present.  Neurological: She is alert and oriented to person, place, and time.  Skin: Skin is warm and dry.  Psychiatric: She has a normal mood and affect.       Assessment:     Increased urinary frequency I.C., does see Dr. Tresa Moore at [facility].  Last cystoscopy was 02/18/13. Microscopic hematuria today.  Has had work up for this in the past. H/O TIA Former smoker, not since 1978    Plan:     Bactrim DS BID X 7 day Pyridium 200mg  TID prn Diflucan 150mg  po x 1, repeat 48 hr  Urine micro and urine culture pending

## 2014-07-21 NOTE — Telephone Encounter (Signed)
Patient has decided to come in for UTI after talking with [facility] yesterday. No appointments today. (frozen schedule)

## 2014-07-21 NOTE — Telephone Encounter (Signed)
Spoke with patient. She sent in an electronic request yesterday for a refill of pyridium. Patient with history of interstitial cystitis and LSA. Patient states she has had 4 days of dysuria after voiding. She used what she had at home on hand of pyridium but ran out after two days, the pyridium helped but pain after voiding returned after completion of pyridium. Patient states she is unsure if UTI or Interstitial cystitis symptoms or aggravation of external skin. No fevers, back pain or chills.   Patient scheduled for office visit today with Dr. Sabra Heck for evaluation and agreeable.   Routing to provider for final review. Patient agreeable to disposition. Will close encounter.

## 2014-07-22 LAB — URINALYSIS, MICROSCOPIC ONLY
Bacteria, UA: NONE SEEN
Casts: NONE SEEN
Crystals: NONE SEEN

## 2014-07-22 LAB — URINE CULTURE: Colony Count: 50000

## 2014-07-24 ENCOUNTER — Telehealth: Payer: Self-pay

## 2014-07-24 NOTE — Telephone Encounter (Signed)
-----   Message from Megan Salon, MD sent at 07/24/2014  7:17 AM EDT ----- Please inform pt her urine culture had bacteria in it but not a specific bacteria.  She should finish her antibiotics.  If symptoms continue after she returns, I will want her to see her urologist.

## 2014-07-24 NOTE — Telephone Encounter (Signed)
Patient notified of results-see result note.//kn

## 2014-07-24 NOTE — Telephone Encounter (Signed)
Lmtcb//kn 

## 2014-08-02 ENCOUNTER — Encounter: Payer: Self-pay | Admitting: Obstetrics & Gynecology

## 2014-08-03 ENCOUNTER — Telehealth: Payer: Self-pay | Admitting: Obstetrics & Gynecology

## 2014-08-03 ENCOUNTER — Telehealth: Payer: Self-pay | Admitting: Emergency Medicine

## 2014-08-03 NOTE — Telephone Encounter (Signed)
Can you hold onto message to see if biopsy comes?  Thanks.

## 2014-08-03 NOTE — Telephone Encounter (Signed)
Patient states she is returning [facility] call but there is not an open phone note.

## 2014-08-03 NOTE — Telephone Encounter (Signed)
Calling patient per Dr. Sabra Heck. Dr. Sabra Heck reviewed electronic and paper records. Patient states she has had a biopsy of her mouth about 30 years ago which showed lichen planus. Patient also feels she has had another biopsy of the vulva, other than the one completed by Tonia Brooms that we have on record, but she is unsure what doctor or when it was done. Patient also reports that Dr. Tresa Moore at [facility] advised that she had scarring r/t lichen planus when she had work up for interstitial cystitis.   Patient states she is not having symptoms at this time.   Advised would send message to Dr. Sabra Heck with patient's history and return call with plan. Patient agreeable.

## 2014-08-03 NOTE — Telephone Encounter (Signed)
Closing this encounter as another encounter is open to discuss with patient.

## 2014-08-03 NOTE — Telephone Encounter (Signed)
Patient returned call. She states she had another biopsy by Dr. Elvera Lennox at [facility] in approximately 2013 or 2012. Advised I would request records of this. If unable to obtain I would contact patient back.

## 2014-08-04 NOTE — Telephone Encounter (Signed)
Additional derm records obtained and to Dr. Sabra Heck.

## 2014-09-09 ENCOUNTER — Emergency Department (HOSPITAL_BASED_OUTPATIENT_CLINIC_OR_DEPARTMENT_OTHER): Payer: BC Managed Care – PPO

## 2014-09-09 ENCOUNTER — Emergency Department (HOSPITAL_BASED_OUTPATIENT_CLINIC_OR_DEPARTMENT_OTHER)
Admission: EM | Admit: 2014-09-09 | Discharge: 2014-09-09 | Disposition: A | Discharge status: Home / Self Care | Payer: BC Managed Care – PPO | Attending: Emergency Medicine | Admitting: Emergency Medicine

## 2014-09-09 ENCOUNTER — Encounter (HOSPITAL_BASED_OUTPATIENT_CLINIC_OR_DEPARTMENT_OTHER): Payer: Self-pay | Admitting: *Deleted

## 2014-09-09 DIAGNOSIS — R1011 Right upper quadrant pain: Secondary | ICD-10-CM | POA: Diagnosis present

## 2014-09-09 DIAGNOSIS — Z973 Presence of spectacles and contact lenses: Secondary | ICD-10-CM | POA: Insufficient documentation

## 2014-09-09 DIAGNOSIS — I10 Essential (primary) hypertension: Secondary | ICD-10-CM | POA: Insufficient documentation

## 2014-09-09 DIAGNOSIS — R197 Diarrhea, unspecified: Secondary | ICD-10-CM | POA: Diagnosis not present

## 2014-09-09 DIAGNOSIS — Z79899 Other long term (current) drug therapy: Secondary | ICD-10-CM | POA: Diagnosis not present

## 2014-09-09 DIAGNOSIS — Z87891 Personal history of nicotine dependence: Secondary | ICD-10-CM | POA: Insufficient documentation

## 2014-09-09 DIAGNOSIS — K219 Gastro-esophageal reflux disease without esophagitis: Secondary | ICD-10-CM | POA: Insufficient documentation

## 2014-09-09 DIAGNOSIS — J45909 Unspecified asthma, uncomplicated: Secondary | ICD-10-CM | POA: Insufficient documentation

## 2014-09-09 DIAGNOSIS — E785 Hyperlipidemia, unspecified: Secondary | ICD-10-CM | POA: Insufficient documentation

## 2014-09-09 DIAGNOSIS — K469 Unspecified abdominal hernia without obstruction or gangrene: Secondary | ICD-10-CM | POA: Diagnosis not present

## 2014-09-09 DIAGNOSIS — R52 Pain, unspecified: Secondary | ICD-10-CM

## 2014-09-09 DIAGNOSIS — Z8739 Personal history of other diseases of the musculoskeletal system and connective tissue: Secondary | ICD-10-CM | POA: Insufficient documentation

## 2014-09-09 DIAGNOSIS — Z87448 Personal history of other diseases of urinary system: Secondary | ICD-10-CM | POA: Diagnosis not present

## 2014-09-09 DIAGNOSIS — K439 Ventral hernia without obstruction or gangrene: Secondary | ICD-10-CM

## 2014-09-09 LAB — LIPASE, BLOOD: Lipase: 24 U/L (ref 22–51)

## 2014-09-09 LAB — URINALYSIS, ROUTINE W REFLEX MICROSCOPIC
Bilirubin Urine: NEGATIVE
Glucose, UA: NEGATIVE mg/dL
Hgb urine dipstick: NEGATIVE
Ketones, ur: 15 mg/dL — AB
Nitrite: NEGATIVE
Protein, ur: NEGATIVE mg/dL
Specific Gravity, Urine: 1.021 (ref 1.005–1.030)
Urobilinogen, UA: 0.2 mg/dL (ref 0.0–1.0)
pH: 6 (ref 5.0–8.0)

## 2014-09-09 LAB — COMPREHENSIVE METABOLIC PANEL
ALT: 28 U/L (ref 14–54)
AST: 31 U/L (ref 15–41)
Albumin: 3.9 g/dL (ref 3.5–5.0)
Alkaline Phosphatase: 61 U/L (ref 38–126)
Anion gap: 8 (ref 5–15)
BUN: 18 mg/dL (ref 6–20)
CO2: 28 mmol/L (ref 22–32)
Calcium: 9.1 mg/dL (ref 8.9–10.3)
Chloride: 104 mmol/L (ref 101–111)
Creatinine, Ser: 0.93 mg/dL (ref 0.44–1.00)
GFR calc Af Amer: >60 mL/min (ref >60)
GFR calc non Af Amer: >60 mL/min (ref >60)
Glucose, Bld: 111 mg/dL — ABNORMAL HIGH (ref 65–99)
Potassium: 3.9 mmol/L (ref 3.5–5.1)
Sodium: 140 mmol/L (ref 135–145)
Total Bilirubin: 1 mg/dL (ref 0.3–1.2)
Total Protein: 6.3 g/dL — ABNORMAL LOW (ref 6.5–8.1)

## 2014-09-09 LAB — CBC WITH DIFFERENTIAL/PLATELET
Basophils Absolute: 0.1 10*3/uL (ref 0.0–0.1)
Basophils Relative: 1 % (ref 0–1)
Eosinophils Absolute: 0.6 10*3/uL (ref 0.0–0.7)
Eosinophils Relative: 7 % — ABNORMAL HIGH (ref 0–5)
HCT: 39.6 % (ref 36.0–46.0)
Hemoglobin: 13.7 g/dL (ref 12.0–15.0)
Lymphocytes Relative: 20 % (ref 12–46)
Lymphs Abs: 1.5 10*3/uL (ref 0.7–4.0)
MCH: 33.7 pg (ref 26.0–34.0)
MCHC: 34.6 g/dL (ref 30.0–36.0)
MCV: 97.3 fL (ref 78.0–100.0)
Monocytes Absolute: 0.8 10*3/uL (ref 0.1–1.0)
Monocytes Relative: 11 % (ref 3–12)
Neutro Abs: 4.6 10*3/uL (ref 1.7–7.7)
Neutrophils Relative %: 61 % (ref 43–77)
Platelets: 214 10*3/uL (ref 150–400)
RBC: 4.07 MIL/uL (ref 3.87–5.11)
RDW: 12.2 % (ref 11.5–15.5)
WBC: 7.6 10*3/uL (ref 4.0–10.5)

## 2014-09-09 LAB — URINE MICROSCOPIC-ADD ON

## 2014-09-09 MED ORDER — IOHEXOL 300 MG/ML  SOLN
50.0000 mL | Freq: Once | INTRAMUSCULAR | Status: AC | PRN
  Administered 2014-09-09: 50 mL via ORAL

## 2014-09-09 MED ORDER — IOHEXOL 300 MG/ML  SOLN
100.0000 mL | Freq: Once | INTRAMUSCULAR | Status: AC | PRN
  Administered 2014-09-09: 100 mL via INTRAVENOUS

## 2014-09-09 MED ORDER — METOPROLOL SUCCINATE ER 50 MG PO TB24
50.0000 mg | ORAL_TABLET | Freq: Every day | ORAL | Status: DC
  Filled 2014-09-09: qty 1

## 2014-09-09 MED ORDER — ONDANSETRON HCL 4 MG/2ML IJ SOLN
4.0000 mg | Freq: Once | INTRAMUSCULAR | Status: AC
  Administered 2014-09-09: 4 mg via INTRAVENOUS
  Filled 2014-09-09: qty 2

## 2014-09-09 MED ORDER — ONDANSETRON HCL 8 MG PO TABS: 8.0000 mg | ORAL_TABLET | Freq: Three times a day (TID) | ORAL | Status: DC | PRN

## 2014-09-09 NOTE — Discharge Instructions (Signed)
[facility] [facility] office in 2 days to schedule the next available appointment. Tell office staff that you were seen here today, and that your primary care physician has arranged for follow-up with you. Take Tylenol or Advil for pain. Take Imodium as directed for diarrhea. Avoid milk or foods containing milk such as cheese or ice cream all having diarrhea. Your blood pressure should be rechecked within the next 3 weeks. Today's was mildly elevated at 158/72. Return if your pain is severe. If you're unable to hold down fluids after taking the medication prescribed for nausea or if you feel worse for any reason. A hernia happens when an organ inside your body pushes out through a weak spot in your belly (abdominal) wall. Most hernias get worse over time. They can often be pushed back into place (reduced). Surgery may be needed to repair hernias that cannot be pushed into place. HOME CARE  Keep doing normal activities.  Avoid lifting more than 10 pounds (4.5 kilograms).  Cough gently and avoid straining. Over time, these things will:  Increase your hernia size.  Irritate your hernia.  Break down hernia repairs.  Stop smoking.  Do not wear anything tight over your hernia. Do not keep the hernia in with an outside bandage.  Eat food that is high in fiber (fruit, vegetables, whole grains).  Drink enough fluids to keep your pee (urine) clear or pale yellow.  Take medicines to make your poop soft (stool softeners) if you cannot poop (constipated). GET HELP RIGHT AWAY IF:   You have a fever.  You have belly pain that gets worse.  You feel sick to your stomach (nauseous) and throw up (vomit).  Your skin starts to bulge out.  Your hernia turns a different color, feels hard, or is tender.  You have increased pain or puffiness (swelling) around the hernia.  You poop more or less often.  Your poop does not look the way normally does.  You have watery poop  (diarrhea).  You cannot push the hernia back in place by applying gentle pressure while lying down. MAKE SURE YOU:   Understand these instructions.  Will watch your condition.  Will get help right away if you are not doing well or get worse. Document Released: 07/10/2009 Document Revised: 04/14/2011 Document Reviewed: 07/10/2009 [facility] Patient Information 2015 [place], [place]. This information is not intended to replace advice given to you by your health care provider. Make sure you discuss any questions you have with your health care provider.

## 2014-09-09 NOTE — ED Provider Notes (Addendum)
CSN: [id number]     Arrival date & time 09/09/14  0941 History   First MD Initiated Contact with Patient 09/09/14 1006     Chief Complaint  Patient presents with  . Abdominal Pain     (Consider location/radiation/quality/duration/timing/severity/associated sxs/prior Treatment) HPI Complains of right-sided abdominal pain started 2 hours ago upon awakening. Pain is worse when she stands improved when she lies flat. No treatment prior to coming here Pain has improved steadily with time. Patient is concerned she may have incarcerated hernia. Has had similar pain in the past with incarcerated hernia which required surgery the following day. She was seen by her PCP earlier this week for abdominal pain and swelling, patient also has abdominal swelling for 2 years which is chronic and unchanged. Other associated symptoms include nausea, no vomiting, diarrhea 2 episodes since this morning. No other associated symptoms Past Medical History  Diagnosis Date  . Palpitations   . GERD (gastroesophageal reflux disease)   . Hypertension   . Mild asthma   . Urethral stricture   . Hyperlipidemia   . Complication of anesthesia     2013 SURGERY vocal cord bothering pt, used smaller ent tube after 2 hernia repair, no problem after  . H/O hiatal hernia   . Frequency of urination   . Hematuria   . Arthritis   . Wears contact lenses   . History of TMJ disorder   . Interstitial cystitis    Past Surgical History  Procedure Laterality Date  . Cesarean section    . Colonoscopy with propofol N/A 08/10/2012    Procedure: COLONOSCOPY WITH PROPOFOL;  Surgeon: Garlan Fair, MD;  Location: [facility];  Service: Endoscopy;  Laterality: N/A;  . Endometrial ablation w/ hydrothermablator  09-27-2002  . Umbilical hernia repair  2013    AND REVISION THE SAME YEAR W/ MESH  . Transthoracic echocardiogram  05-26-2011    MILD LVH/  EF 10-93%/  GRADE I DIASTOLIC DYSFUNCTION/  MILD AR//   06-20-2008  NOMRAL STRESS ECHO   . Wisdom tooth extraction  AGE 63  . Cystoscopy with urethral dilatation N/A 02/18/2013    Procedure: CYSTOSCOPY WITH URETHRAL DILATATION ( NO BALLOON) AND HYDRODISTENSION;  Surgeon: Alexis Frock, MD;  [facility]: [facility];  Service: Urology;  Laterality: N/A;   Family History  Problem Relation Age of Onset  . Dementia Mother   . Leukemia Child    History  Substance Use Topics  . Smoking status: Former Smoker -- 0.25 packs/day for 2 years    Types: Cigarettes    Quit date: 02/04/1976  . Smokeless tobacco: Never Used  . Alcohol Use: Yes     Comment: occasional   OB History    Gravida Para Term Preterm AB TAB SAB Ectopic Multiple Living   3 3 3       2      Review of Systems  Constitutional: Negative.   HENT: Negative.   Respiratory: Negative.   Cardiovascular: Negative.   Gastrointestinal: Positive for nausea, abdominal pain and diarrhea.  Musculoskeletal: Negative.   Skin: Negative.   Neurological: Negative.   Psychiatric/Behavioral: Negative.   All other systems reviewed and are negative.     Allergies  Hydrocodone and Tramadol  Home Medications   Prior to Admission medications   Medication Sig Start Date End Date Taking? Authorizing Provider  acetaminophen (TYLENOL) 500 MG tablet Take 1,000 mg by mouth every 6 (six) hours as needed. For pain     Historical Provider,  MD  buPROPion (WELLBUTRIN XL) 300 MG 24 hr tablet Take 300 mg by mouth every morning.     Historical Provider, MD  Calcium Carbonate-Vitamin D (CALTRATE 600+D) 600-400 MG-UNIT per tablet Take 2 tablets by mouth daily.     Historical Provider, MD  cholecalciferol (VITAMIN D) 1000 UNITS tablet Take 1,000 Units by mouth daily.    Historical Provider, MD  clobetasol ointment (TEMOVATE) 0.05 % Apply topically as needed. APPLY TO AFFECTED AREA TWICE A DAY 04/18/13   Elveria Rising, MD  Coenzyme Q10 (CO Q 10) 100 MG CAPS Take 1 capsule by mouth daily.    Historical Provider, MD  Evening  Primrose Oil 1000 MG CAPS Take 2 capsules by mouth daily.      Historical Provider, MD  Flaxseed, Linseed, 1000 MG CAPS Take 2 capsules by mouth daily.     Historical Provider, MD  fluconazole (DIFLUCAN) 150 MG tablet Take 1 tablet (150 mg total) by mouth once. Repeat in 48 hours if symptoms are not completely resolved. 07/21/14   Megan Salon, MD  fluorometholone (FML) 0.1 % ophthalmic suspension daily. 06/09/14   Historical Provider, MD  hydrochlorothiazide 25 MG tablet Take 25 mg by mouth every morning.     Historical Provider, MD  ketorolac (TORADOL) 10 MG tablet Take 10 mg by mouth every 6 (six) hours as needed. for pain 02/02/14   Historical Provider, MD  KLOR-CON M10 10 MEQ tablet TAKE 1 TABLET (10 MEQ TOTAL) BY MOUTH DAILY. TAKE WITH THE 20 MEQ TABLET DAILY TO = 30 MEQ DAILY 05/01/14   Peter M Martinique, MD  KLOR-CON M20 20 MEQ tablet TAKE 20 MEQ BY MOUTH DAILY. TAKE 20 MEQ AND 10 MEQ TO = 30 MEQ DAILY 05/01/14   Peter M Martinique, MD  lidocaine (XYLOCAINE) 5 % ointment Apply topically 3 (three) times daily as needed. 06/03/13   Elveria Rising, MD  losartan (COZAAR) 50 MG tablet Take 50 mg by mouth every morning.     Historical Provider, MD  meclizine (ANTIVERT) 25 MG tablet Take 1 tablet (25 mg total) by mouth 3 (three) times daily as needed for dizziness. 04/14/13   John Molpus, MD  metoprolol succinate (TOPROL-XL) 50 MG 24 hr tablet Take 2 tablets (100 mg total) by mouth daily. OK TAKE EXTRA 1/2 TAB FOR BREAKTHROUGH PALPITATIONS 03/07/13   Liliane Shi, PA-C  Multiple Vitamin (MULTIVITAMIN WITH MINERALS) TABS Take 1 tablet by mouth daily.    Historical Provider, MD  mupirocin ointment (BACTROBAN) 2 % Apply 1 application topically daily.  02/08/14   Historical Provider, MD  nystatin (MYCOSTATIN) 100000 UNIT/ML suspension Take 5 mLs (500,000 Units total) by mouth 4 (four) times daily. 06/17/13   Elveria Rising, MD  nystatin-triamcinolone ointment [facility]) Apply 1 application topically 2 (two) times daily.  03/10/14   Kem Boroughs, FNP  omeprazole (PRILOSEC OTC) 20 MG tablet Take 20 mg by mouth every morning.     Historical Provider, MD  PAZEO 0.7 % SOLN INSTILL 1 DROP INTO BOTH EYES EVERY MORNING 06/27/14   Historical Provider, MD  phenazopyridine (PYRIDIUM) 200 MG tablet Take 1 tablet (200 mg total) by mouth 3 (three) times daily as needed for pain. 07/21/14   Megan Salon, MD  Probiotic Product (ALIGN PO) Take 1 capsule by mouth daily.     Historical Provider, MD  simvastatin (ZOCOR) 40 MG tablet Take 40 mg by mouth every morning.    Historical Provider, MD  sulfamethoxazole-trimethoprim (BACTRIM DS) 800-160 MG per tablet  Take 1 tablet by mouth 2 (two) times daily. 07/21/14   Megan Salon, MD  valACYclovir (VALTREX) 500 MG tablet Increase to bid x 3 day with outbreak 04/25/14   Kem Boroughs, FNP  VENTOLIN HFA 108 (90 BASE) MCG/ACT inhaler Inhale 2 puffs into the lungs every 6 (six) hours as needed.  01/17/14   Historical Provider, MD  zolpidem (AMBIEN) 10 MG tablet TAKE 1/2 TABLET BY MOUTH AT BEDTIME AS NEEDED FOR SLEEP    Elveria Rising, MD   BP 163/79 mmHg  Pulse 74  Temp(Src) 98.6 F (37 C) (Oral)  Resp 18  Ht 5\' 4"  (1.626 m)  Wt 160 lb (72.576 kg)  BMI 27.45 kg/m2  SpO2 98% Physical Exam  Constitutional: She appears well-developed and well-nourished. No distress.  HENT:  Head: Normocephalic and atraumatic.  Eyes: Conjunctivae are normal. Pupils are equal, round, and reactive to light.  Neck: Neck supple. No tracheal deviation present. No thyromegaly present.  Cardiovascular: Normal rate and regular rhythm.   No murmur heard. Pulmonary/Chest: Effort normal and breath sounds normal.  Abdominal: Soft. Bowel sounds are normal. She exhibits no distension and no mass. There is tenderness. There is no rebound and no guarding.  Multiple surgical scars, tender at right upper quadrant and right lower quadrant. No masses. No obvious hernia  Musculoskeletal: Normal range of motion. She exhibits  no edema or tenderness.  Neurological: She is alert. Coordination normal.  Skin: Skin is warm and dry. No rash noted.  Psychiatric: She has a normal mood and affect.  Nursing note and vitals reviewed.   ED Course  Procedures (including critical care time) Labs Review Labs Reviewed  URINALYSIS, ROUTINE W REFLEX MICROSCOPIC (NOT AT [facility])    Imaging Review No results found.   EKG Interpretation None     11 AM declines pain medicine. Nausea is improved after treatment with intravenous Zofran. Results for orders placed or performed during the hospital encounter of 09/09/14  Urinalysis, Routine w reflex microscopic (not at [facility])  Result Value Ref Range   Color, Urine AMBER (A) YELLOW   APPearance CLOUDY (A) CLEAR   Specific Gravity, Urine 1.021 1.005 - 1.030   pH 6.0 5.0 - 8.0   Glucose, UA NEGATIVE NEGATIVE mg/dL   Hgb urine dipstick NEGATIVE NEGATIVE   Bilirubin Urine NEGATIVE NEGATIVE   Ketones, ur 15 (A) NEGATIVE mg/dL   Protein, ur NEGATIVE NEGATIVE mg/dL   Urobilinogen, UA 0.2 0.0 - 1.0 mg/dL   Nitrite NEGATIVE NEGATIVE   Leukocytes, UA MODERATE (A) NEGATIVE  Urine microscopic-add on  Result Value Ref Range   Squamous Epithelial / LPF FEW (A) RARE   WBC, UA 3-6 <3 WBC/hpf   Bacteria, UA MANY (A) RARE   Casts HYALINE CASTS (A) NEGATIVE   Urine-Other MUCOUS PRESENT   Comprehensive metabolic panel  Result Value Ref Range   Sodium 140 135 - 145 mmol/L   Potassium 3.9 3.5 - 5.1 mmol/L   Chloride 104 101 - 111 mmol/L   CO2 28 22 - 32 mmol/L   Glucose, Bld 111 (H) 65 - 99 mg/dL   BUN 18 6 - 20 mg/dL   Creatinine, Ser 0.93 0.44 - 1.00 mg/dL   Calcium 9.1 8.9 - 10.3 mg/dL   Total Protein 6.3 (L) 6.5 - 8.1 g/dL   Albumin 3.9 3.5 - 5.0 g/dL   AST 31 15 - 41 U/L   ALT 28 14 - 54 U/L   Alkaline Phosphatase 61 38 - 126 U/L  Total Bilirubin 1.0 0.3 - 1.2 mg/dL   GFR calc non Af Amer >60 >60 mL/min   GFR calc Af Amer >60 >60 mL/min   Anion gap 8 5 - 15  CBC with  Differential/Platelet  Result Value Ref Range   WBC 7.6 4.0 - 10.5 K/uL   RBC 4.07 3.87 - 5.11 MIL/uL   Hemoglobin 13.7 12.0 - 15.0 g/dL   HCT 39.6 36.0 - 46.0 %   MCV 97.3 78.0 - 100.0 fL   MCH 33.7 26.0 - 34.0 pg   MCHC 34.6 30.0 - 36.0 g/dL   RDW 12.2 11.5 - 15.5 %   Platelets 214 150 - 400 K/uL   Neutrophils Relative % 61 43 - 77 %   Neutro Abs 4.6 1.7 - 7.7 K/uL   Lymphocytes Relative 20 12 - 46 %   Lymphs Abs 1.5 0.7 - 4.0 K/uL   Monocytes Relative 11 3 - 12 %   Monocytes Absolute 0.8 0.1 - 1.0 K/uL   Eosinophils Relative 7 (H) 0 - 5 %   Eosinophils Absolute 0.6 0.0 - 0.7 K/uL   Basophils Relative 1 0 - 1 %   Basophils Absolute 0.1 0.0 - 0.1 K/uL  Lipase, blood  Result Value Ref Range   Lipase 24 22 - 51 U/L   Ct Abdomen Pelvis W Contrast  09/09/2014   CLINICAL DATA:  Diarrhea and abdominal pain hemorrhoids, some blood per rectum today  EXAM: CT ABDOMEN AND PELVIS WITH CONTRAST  TECHNIQUE: Multidetector CT imaging of the abdomen and pelvis was performed using the standard protocol following bolus administration of intravenous contrast.  CONTRAST:  160mL OMNIPAQUE IOHEXOL 300 MG/ML SOLN, 47mL OMNIPAQUE IOHEXOL 300 MG/ML SOLN  COMPARISON:  01/13/2013  FINDINGS: Lower chest: 2 mm nodule left lung base stable and therefore benign. Lung bases otherwise clear  Hepatobiliary: Stable hepatic cysts.  Gallbladder normal.  Pancreas: Normal  Spleen: Splenule stable.  No significant abnormalities.  Adrenals/Urinary Tract: Adrenal glands normal. Kidneys normal. Bladder normal  Stomach/Bowel: Stomach is normal. Nonobstructive bowel gas pattern. Diverticulosis of the distal large bowel. There is dehiscence of the anterior abdominal wall in the supraumbilical region. Loops of small and large bowel enter and exit this region, with large bowel involved in the more cranial aspect, associated with mild wispy attenuation possibly indicative of mild inflammation, and focal smaller adjacent inferior 2cm  herniation to the right of midline containing a loop of small bowel with no evidence of inflammatory change or dilatation to suggest strangulation or obstruction.  Vascular/Lymphatic: Mild atherosclerotic calcification  Reproductive: Negative  Other: No other significant findings  Musculoskeletal: No acute abnormalities  IMPRESSION: Two adjacent right anterior abdominal wall hernias; equivocally, the more cranial of the two, which involves large bowel, may be associated with minimal inflammation. Correlate clinically for symptoms that refer to this region, and, if present, consider the possibility of early or mild strangulation.   Electronically Signed   By: Skipper Cliche M.D.   On: 09/09/2014 12:41    MDM  Case discussed with Dr.Gerkin who reviewed CT scan.He feels No signs of incarceration or strangulation. Patient has follow-up with Dr.Rosenbower from [facility] surgery Plan Advil or Tylenol for pain, prescription for Zofran as needed for nausea. Doubt uti. No urinary sxImodium as directed for diarrhea. Avoid dairy. Blood pressure recheck within the next 3 weeks. Follow-up with [place] [facility] next week Diagnoses #1abdominal pain #2 ventral hernias #3diarrhea #4elevated blood pressure Final diagnoses:  None  Orlie Dakin, MD 09/09/14 [place], MD 09/09/14 [phone]

## 2014-09-09 NOTE — ED Notes (Signed)
Patient c/o abd pain & diarrhea that started approximately 2 hours ago. She is concerned because she is scheduled to see a surgeon this month regarding a possible hernia.

## 2014-09-18 ENCOUNTER — Ambulatory Visit: Payer: Self-pay | Admitting: General Surgery

## 2014-09-18 NOTE — H&P (Signed)
History of Present Illness Ralene Ok MD; 09/18/2014 2:51 PM) The patient is a 64 year old female who presents with an incisional hernia. Patient is a 64 year old female who is referred by Bing Matter, PA-C for evaluation of recurrent incisional hernia.  The patient brought Her 2 sons in today for further consultation. Patient has had no change in her hernia since her last visit.   Review of Systems Ralene Ok, MD; 09/18/2014 2:50 00) General Not Present- Appetite Loss, Chills, Fatigue, Fever, Night Sweats, Weight Gain and Weight Loss. Skin Not Present- Change in Wart/Mole, Dryness, Hives, Jaundice, New Lesions, Non-Healing Wounds, Rash and Ulcer. HEENT Present- Visual Disturbances and Wears glasses/contact lenses. Not Present- Earache, Hearing Loss, Hoarseness, Nose Bleed, Oral Ulcers, Ringing in the Ears, Seasonal Allergies, Sinus Pain, Sore Throat and Yellow Eyes. Respiratory Not Present- Bloody sputum, Chronic Cough, Difficulty Breathing, Snoring and Wheezing. Breast Not Present- Breast Mass, Breast Pain, Nipple Discharge and Skin Changes. Cardiovascular Present- Palpitations. Not Present- Chest Pain, Difficulty Breathing Lying Down, Leg Cramps, Rapid Heart Rate, Shortness of Breath and Swelling of Extremities. Gastrointestinal Present- Abdominal Pain, Bloating, Hemorrhoids and Indigestion. Not Present- Bloody Stool, Change in Bowel Habits, Chronic diarrhea, Constipation, Difficulty Swallowing, Excessive gas, Gets full quickly at meals, Nausea, Rectal Pain and Vomiting. Female Genitourinary Present- Frequency, Pelvic Pain and Urgency. Not Present- Nocturia and Painful Urination. Musculoskeletal Present- Back Pain, Joint Pain, Muscle Pain and Swelling of Extremities. Not Present- Joint Stiffness and Muscle Weakness. Neurological Present- Tingling. Not Present- Decreased Memory, Fainting, Headaches, Numbness, Seizures, Tremor, Trouble walking and Weakness. Psychiatric Not Present-  Anxiety, Bipolar, Change in Sleep Pattern, Depression, Fearful and Frequent crying. Hematology Present- Easy Bruising. Not Present- Excessive bleeding, Gland problems, HIV and Persistent Infections.   Physical Exam Ralene Ok, MD; 09/18/2014 2:50 00) General Mental Status-Alert. General Appearance-Consistent with stated age. Hydration-Well hydrated. Voice-Normal.  Head and Neck Head-normocephalic, atraumatic with no lesions or palpable masses. Trachea-midline. Thyroid Gland Characteristics - normal size and consistency.  Chest and Lung Exam Chest and lung exam reveals -quiet, even and easy respiratory effort with no use of accessory muscles and on auscultation, normal breath sounds, no adventitious sounds and normal vocal resonance. Inspection Chest Wall - Normal. Back - normal.  Cardiovascular Cardiovascular examination reveals -normal heart sounds, regular rate and rhythm with no murmurs and normal pedal pulses bilaterally.  Abdomen Inspection Skin: Scar: Note: Midline periumbilical. Hernias - Incisional - Reducible. Note: Patient has a large midline abdominal defect on physical exam, supraumbilically, approximately 8 cm. Palpation/Percussion Normal exam - Soft, Non Tender, No Rebound tenderness, No Rigidity (guarding) and No hepatosplenomegaly. Auscultation Normal exam - Bowel sounds normal.  Neurologic Neurologic evaluation reveals -alert and oriented x 3 with no impairment of recent or remote memory. Mental Status-Normal.    Assessment & Plan Ralene Ok MD; 09/18/2014 2:52 PM) RECURRENT INCISIONAL HERNIA (553.21  K43.2) Impression: Patient is a 64 year old female with a recurrent incisional hernia, large.  1. A long discussion with the patient and her sons in regards to the hernia repair. I discussed with her the TAR hernia repair with mesh. We will over the anatomy and the possibility for extensive lysis of adhesions and possible risk  mesh removal from her previous operation. All questions from the patient and her sons were answered appropriately. 2. We'll proceed to the operating room for a TAR hernia repair with mesh. 3. All risks and benefits were discussed with the patient to generally include, but not limited to: infection, bleeding, damage to surrounding  structures, acute and chronic nerve pain, and recurrence. Alternatives were offered and described. All questions were answered and the patient voiced understanding of the procedure and wishes to proceed at this point with hernia repair.

## 2014-10-05 NOTE — Telephone Encounter (Signed)
Okay to close encounter per Dr. Sabra Heck.

## 2014-10-17 ENCOUNTER — Encounter (HOSPITAL_COMMUNITY): Payer: Self-pay

## 2014-10-17 ENCOUNTER — Encounter (HOSPITAL_COMMUNITY)
Admission: RE | Admit: 2014-10-17 | Discharge: 2014-10-17 | Disposition: A | Discharge status: Home / Self Care | Payer: BC Managed Care – PPO | Source: Ambulatory Visit | Attending: General Surgery | Admitting: General Surgery

## 2014-10-17 DIAGNOSIS — K432 Incisional hernia without obstruction or gangrene: Secondary | ICD-10-CM | POA: Diagnosis not present

## 2014-10-17 DIAGNOSIS — Z01812 Encounter for preprocedural laboratory examination: Secondary | ICD-10-CM | POA: Diagnosis present

## 2014-10-17 HISTORY — DX: Lichen planus, unspecified: L43.9

## 2014-10-17 HISTORY — DX: Irritable bowel syndrome, unspecified: K58.9

## 2014-10-17 HISTORY — DX: Claustrophobia: F40.240

## 2014-10-17 LAB — BASIC METABOLIC PANEL
Anion gap: 7 (ref 5–15)
BUN: 24 mg/dL — ABNORMAL HIGH (ref 6–20)
CO2: 28 mmol/L (ref 22–32)
Calcium: 9.8 mg/dL (ref 8.9–10.3)
Chloride: 102 mmol/L (ref 101–111)
Creatinine, Ser: 1.1 mg/dL — ABNORMAL HIGH (ref 0.44–1.00)
GFR calc Af Amer: >60 mL/min (ref >60)
GFR calc non Af Amer: 53 mL/min — ABNORMAL LOW (ref >60)
Glucose, Bld: 94 mg/dL (ref 65–99)
Potassium: 4.2 mmol/L (ref 3.5–5.1)
Sodium: 137 mmol/L (ref 135–145)

## 2014-10-17 LAB — CBC
HCT: 40.8 % (ref 36.0–46.0)
Hemoglobin: 14 g/dL (ref 12.0–15.0)
MCH: 32.9 pg (ref 26.0–34.0)
MCHC: 34.3 g/dL (ref 30.0–36.0)
MCV: 96 fL (ref 78.0–100.0)
Platelets: 266 10*3/uL (ref 150–400)
RBC: 4.25 MIL/uL (ref 3.87–5.11)
RDW: 12.6 % (ref 11.5–15.5)
WBC: 8.8 10*3/uL (ref 4.0–10.5)

## 2014-10-17 NOTE — Pre-Procedure Instructions (Signed)
Cynthia Peters  10/17/2014      [id number] - [place], [place] - [address] [place] [address] Phone: [phone] Fax: [phone]    Your procedure is scheduled on Wednesday, October 25, 2014.  Report to [facility] Admitting at 6:30 A.M.  Call this number if you have problems the morning of surgery:  [phone]   Remember:  Do not eat food or drink liquids after midnight Tuesday, October 24, 2014  Take these medicines the morning of surgery with A SIP OF WATER :buPROPion (WELLBUTRIN XL),  metoprolol succinate (TOPROL-XL),  omeprazole (PRILOSEC OTC),  valACYclovir (VALTREX), cycloSPORINE (RESTASIS) eye drops, if needed: acetaminophen (TYLENOL) for pain,  diazepam (VALIUM),  loratadine (CLARITIN) for allergies OR montelukast (SINGULAIR) for seasonal allergies,   meclizine (ANTIVERT) for dizziness, fluticasone (FLONASE) nasal spray for allergies, Olopatadine HCl (PAZEO) for allergies, albuterol (PROVENTIL HFA;VENTOLIN HFA)  Inhaler for wheezing or shortness of breath ( Bring inhaler in with you on day of procedure).  Stop taking Aspirin, vitamins and herbal medications such as Coenzyme Q10 (CO Q 10),  Evening Primrose Oil 1000,  Flaxseed, Linseed.  Do not take any NSAIDs ie: Ibuprofen, Advil, Naproxen or any medication containing  Aspirin such as ketorolac (TORADOL); stop Wednesday, October 18, 2014.  Do not wear jewelry, make-up or nail polish.  Do not wear lotions, powders, or perfumes.  You may not wear deodorant.  Do not shave 48 hours prior to surgery.    Do not bring valuables to the hospital.  [facility] is not responsible for any belongings or valuables.  Contacts, dentures or bridgework may not be worn into surgery.  Leave your suitcase in the car.  After surgery it may be brought to your room.  For patients admitted to the hospital, discharge time will be determined by your  treatment team.  Patients discharged the day of surgery will not be allowed to drive home.   Name and phone number of your driver:   Special instructions: Special Instructions:Special Instructions: [facility] - Preparing for Surgery  Before surgery, you can play an important role.  Because skin is not sterile, your skin needs to be as free of germs as possible.  You can reduce the number of germs on you skin by washing with CHG (chlorahexidine gluconate) soap before surgery.  CHG is an antiseptic cleaner which kills germs and bonds with the skin to continue killing germs even after washing.  Please DO NOT use if you have an allergy to CHG or antibacterial soaps.  If your skin becomes reddened/irritated stop using the CHG and inform your nurse when you arrive at Short Stay.  Do not shave (including legs and underarms) for at least 48 hours prior to the first CHG shower.  You may shave your face.  Please follow these instructions carefully:   1.  Shower with CHG Soap the night before surgery and the morning of Surgery.  2.  If you choose to wash your hair, wash your hair first as usual with your normal shampoo.  3.  After you shampoo, rinse your hair and body thoroughly to remove the Shampoo.  4.  Use CHG as you would any other liquid soap.  You can apply chg directly  to the skin and wash gently with scrungie or a clean washcloth.  5.  Apply the CHG Soap to your body ONLY FROM THE NECK DOWN.  Do not use on open wounds or open sores.  Avoid contact with your eyes, ears, mouth and genitals (private parts).  Wash genitals (private parts) with your normal soap.  6.  Wash thoroughly, paying special attention to the area where your surgery will be performed.  7.  Thoroughly rinse your body with warm water from the neck down.  8.  DO NOT shower/wash with your normal soap after using and rinsing off the CHG Soap.  9.  Pat yourself dry with a clean towel.            10.  Wear clean pajamas.             11.  Place clean sheets on your bed the night of your first shower and do not sleep with pets.  Day of Surgery  Do not apply any lotions/deodorants the morning of surgery.  Please wear clean clothes to the hospital/surgery center.  Please read over the following fact sheets that you were given. Pain Booklet, Coughing and Deep Breathing and Surgical Site Infection Prevention

## 2014-10-17 NOTE — Progress Notes (Signed)
Pt denies SOB and chest pain but is under the care of Dr. Martinique, cardiology. Pt stated that she may have had an EKG done at PCP's office within the last year; records requested. Pt denies having a cardiac cath. Spoke with Willeen Cass, NP, anesthesia, regarding pt past anesthesia history of needing a smaller ent tube, sore vocal cord and waking up during a colonoscopy and MRI; no new orders given.

## 2014-10-18 ENCOUNTER — Telehealth: Payer: Self-pay | Admitting: *Deleted

## 2014-10-18 NOTE — Telephone Encounter (Signed)
Patient called requesting OV with Dr. Sabra Heck or Ms. Chong Sicilian is having a possible hsv/lichen planus outbreak and wanting to be seen. Offered appointment for today, patent would rather see ms. Patty or Dr. Sabra Heck. Next available is this Friday, patient okay to wait until then. Scheduled patient for ov for 10/20/14 with Dr. Sabra Heck.  Sent to Dr. Sabra Heck for [place].

## 2014-10-18 NOTE — Telephone Encounter (Signed)
Thanks.  I'm glad her appointment is with me.  Ok to close encounter.

## 2014-10-20 ENCOUNTER — Encounter: Payer: Self-pay | Admitting: Obstetrics & Gynecology

## 2014-10-20 ENCOUNTER — Ambulatory Visit (INDEPENDENT_AMBULATORY_CARE_PROVIDER_SITE_OTHER): Payer: BC Managed Care – PPO | Admitting: Obstetrics & Gynecology

## 2014-10-20 VITALS — BP 130/78 | HR 60 | Resp 16 | Wt 164.0 lb

## 2014-10-20 DIAGNOSIS — N9089 Other specified noninflammatory disorders of vulva and perineum: Secondary | ICD-10-CM | POA: Diagnosis not present

## 2014-10-20 DIAGNOSIS — Z124 Encounter for screening for malignant neoplasm of cervix: Secondary | ICD-10-CM | POA: Diagnosis not present

## 2014-10-20 DIAGNOSIS — K589 Irritable bowel syndrome without diarrhea: Secondary | ICD-10-CM | POA: Insufficient documentation

## 2014-10-20 DIAGNOSIS — K469 Unspecified abdominal hernia without obstruction or gangrene: Secondary | ICD-10-CM | POA: Insufficient documentation

## 2014-10-20 DIAGNOSIS — L439 Lichen planus, unspecified: Secondary | ICD-10-CM | POA: Diagnosis not present

## 2014-10-20 DIAGNOSIS — R300 Dysuria: Secondary | ICD-10-CM

## 2014-10-20 DIAGNOSIS — F419 Anxiety disorder, unspecified: Secondary | ICD-10-CM | POA: Insufficient documentation

## 2014-10-20 DIAGNOSIS — Z4889 Encounter for other specified surgical aftercare: Secondary | ICD-10-CM | POA: Insufficient documentation

## 2014-10-20 LAB — POCT URINALYSIS DIPSTICK
Bilirubin, UA: NEGATIVE
Blood, UA: NEGATIVE
Ketones, UA: NEGATIVE
Nitrite, UA: NEGATIVE
Protein, UA: NEGATIVE
Urobilinogen, UA: NEGATIVE
pH, UA: 5

## 2014-10-20 LAB — WET PREP BY MOLECULAR PROBE
Candida species: NEGATIVE
Gardnerella vaginalis: NEGATIVE
Trichomonas vaginosis: NEGATIVE

## 2014-10-20 MED ORDER — FLUCONAZOLE 150 MG PO TABS: 150.0000 mg | ORAL_TABLET | Freq: Once | ORAL | Status: DC

## 2014-10-20 MED ORDER — NYSTATIN 100000 UNIT/GM EX CREA: 1.0000 "application " | TOPICAL_CREAM | Freq: Two times a day (BID) | CUTANEOUS | Status: DC

## 2014-10-20 NOTE — Progress Notes (Signed)
Subjective:     Patient ID: Marlyce Huge, female   DOB: 02/15/1950, 64 y.o.   MRN: [id number]  HPI 64 yo G3P3 DWF here for complaint of vulvar irritation that has been off and on for about three weeks.  Pt reports she took Keflex about three weeks ago due to skin cellulitis that started after a reaction to the pneumonia vaccine.  Started having itching and irritation after a few days of antibiotics.  Pt had a diflucan at home so took this, 150mg  po x 1.  Pt reported the itching partially subsided.  She is going to have hernia repair next week. This will be her third one.  She is anticipating about a week in the hospital.  She wanted to be seen before the surgery.  She also wonders if this has anything to do with a lichen planus flare.  Reviewed with pt that I've obtained her records from both dermatology visits--[facility]--and neither show that she has this diagnosis.  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  Still, I am not sure she has the correct vulvar diagnosis and I do not think she should use any topical steroids unless another biopsy is done and shows abnormal findings like lichen planus.  I am actually going to modify the diagnosis in EPIC to try and reflect what her specialist visits have shown.    Lastly, pt is wondering if she is having an HSV outbreak.  States it is very hard for her to tell which problem she is having if has irritation and itching.  D/W pt typical HSV symptoms to help he understand typical differences.  Denies vaginal bleeding, vaginal discharge.  She does report a little burnign with urination but thinks this is due to skin irritation.  Denies hesitancy or urgency.  No back pain or fevers.     Review of Systems  All other systems reviewed and are negative.      Objective:   Physical Exam  Constitutional: She is oriented to person, place, and time. She appears well-developed and well-nourished.  Abdominal: Soft.  Bowel sounds are normal.  Genitourinary: Vagina normal and uterus normal.    There is no rash, tenderness or lesion on the right labia. There is no rash, tenderness or lesion on the left labia. Cervix exhibits no motion tenderness, no discharge and no friability. Right adnexum displays no mass, no tenderness and no fullness. Left adnexum displays no mass, no tenderness and no fullness. No erythema, tenderness or bleeding in the vagina. No foreign body around the vagina. No signs of injury around the vagina. No vaginal discharge found.  Musculoskeletal: Normal range of motion.  Lymphadenopathy:       Right: No inguinal adenopathy present.       Left: No inguinal adenopathy present.  Neurological: She is alert and oriented to person, place, and time.  Skin: Skin is warm and dry.  Psychiatric: She has a normal mood and affect.       Assessment:     Vulvar irritation Pt does NOT have biopsy proven lichen planus NO evidence of HSV outbreak today Overdue for Pap and MMG.  No AEX is scheduled at this time.     Plan:     Pap obtained today Affirm obtained Diflucan 150mg  po x 1, repeat 72 hours. Nystatin cream bid up to 7 days for irritation HIGHLY encouraged pt to proceed with MMG as she has nt had one since 2006.  Declined breast  exam today as well.  Advised pt I would go with her to her MMG appointment if this would help her go.  She laughs but declines, although I am totally serious about this. Continue suppressive HSV dosing with Valtrex, 500mg  daily.  ~25 minutes spent with patient >50% of time was in face to face discussion of lichen planus hx and review of outside records.  Questions answered.Marland Kitchen

## 2014-10-21 ENCOUNTER — Telehealth: Payer: Self-pay | Admitting: General Surgery

## 2014-10-21 NOTE — Progress Notes (Signed)
Patient called with question regarding medications. I advised patient she could keep taking her potassium but needed to stop her probiotic.

## 2014-10-21 NOTE — Telephone Encounter (Signed)
Pt was told to hold hctz, losartan for 1 week prior to surgery and now complains of bilateral leg swelling. -I advised her to take the hctz today through Tuesday and hold the morning of surgery.

## 2014-10-23 ENCOUNTER — Telehealth: Payer: Self-pay

## 2014-10-23 LAB — IPS PAP TEST WITH REFLEX TO HPV

## 2014-10-23 NOTE — Telephone Encounter (Signed)
-----   Message from Megan Salon, MD sent at 10/22/2014 10:30 PM EDT ----- Inform pt her affirm testing was negative.  Can you ask her symptoms are since she was seen last week.

## 2014-10-23 NOTE — Telephone Encounter (Signed)
Lmtcb//kn 

## 2014-10-23 NOTE — Progress Notes (Signed)
Patient called stating she had a tooth that was giving her a problem and that she was going to dentist regarding same. I advised her to Contact surgeon regarding same.

## 2014-10-24 MED ORDER — CEFAZOLIN SODIUM-DEXTROSE 2-3 GM-% IV SOLR
2.0000 g | INTRAVENOUS | Status: AC
  Administered 2014-10-25: 2 g via INTRAVENOUS
  Filled 2014-10-24: qty 50

## 2014-10-24 NOTE — Progress Notes (Signed)
Pt called and stated that she has been to dentist and he has ground down tooth that was bothering her and put a temporary cap on it and told her it was not infected. Pt states she called the surgeons office and they took this message and told her they would relay it to the doctor.

## 2014-10-25 ENCOUNTER — Inpatient Hospital Stay (HOSPITAL_COMMUNITY): Payer: BC Managed Care – PPO | Admitting: Vascular Surgery

## 2014-10-25 ENCOUNTER — Inpatient Hospital Stay (HOSPITAL_COMMUNITY): Payer: BC Managed Care – PPO | Admitting: Certified Registered Nurse Anesthetist

## 2014-10-25 ENCOUNTER — Encounter (HOSPITAL_COMMUNITY): Payer: Self-pay | Admitting: Certified Registered Nurse Anesthetist

## 2014-10-25 ENCOUNTER — Inpatient Hospital Stay (HOSPITAL_COMMUNITY)
Admission: RE | Admit: 2014-10-25 | Discharge: 2014-10-30 | DRG: 355 | Disposition: A | Discharge status: Home / Self Care | Payer: BC Managed Care – PPO | Source: Ambulatory Visit | Attending: General Surgery | Admitting: General Surgery

## 2014-10-25 ENCOUNTER — Encounter (HOSPITAL_COMMUNITY)
Admission: RE | Disposition: A | Discharge status: Home / Self Care | Payer: Self-pay | Source: Ambulatory Visit | Attending: General Surgery

## 2014-10-25 DIAGNOSIS — J45909 Unspecified asthma, uncomplicated: Secondary | ICD-10-CM | POA: Diagnosis not present

## 2014-10-25 DIAGNOSIS — I10 Essential (primary) hypertension: Secondary | ICD-10-CM | POA: Diagnosis present

## 2014-10-25 DIAGNOSIS — Z87891 Personal history of nicotine dependence: Secondary | ICD-10-CM

## 2014-10-25 DIAGNOSIS — K432 Incisional hernia without obstruction or gangrene: Principal | ICD-10-CM | POA: Diagnosis present

## 2014-10-25 DIAGNOSIS — Z8249 Family history of ischemic heart disease and other diseases of the circulatory system: Secondary | ICD-10-CM | POA: Diagnosis not present

## 2014-10-25 HISTORY — PX: LAPAROTOMY: SHX154

## 2014-10-25 HISTORY — PX: LYSIS OF ADHESION: SHX5961

## 2014-10-25 HISTORY — PX: INCISIONAL HERNIA REPAIR: SHX193

## 2014-10-25 HISTORY — PX: INSERTION OF MESH: SHX5868

## 2014-10-25 HISTORY — PX: MUCOSAL ADVANCEMENT FLAP: SHX6479

## 2014-10-25 LAB — CBC
HCT: 36.8 % (ref 36.0–46.0)
Hemoglobin: 12.8 g/dL (ref 12.0–15.0)
MCH: 33.6 pg (ref 26.0–34.0)
MCHC: 34.8 g/dL (ref 30.0–36.0)
MCV: 96.6 fL (ref 78.0–100.0)
Platelets: 179 10*3/uL (ref 150–400)
RBC: 3.81 MIL/uL — ABNORMAL LOW (ref 3.87–5.11)
RDW: 12.8 % (ref 11.5–15.5)
WBC: 16.2 10*3/uL — ABNORMAL HIGH (ref 4.0–10.5)

## 2014-10-25 LAB — POCT I-STAT 4, (NA,K, GLUC, HGB,HCT)
Glucose, Bld: 105 mg/dL — ABNORMAL HIGH (ref 65–99)
HCT: 39 % (ref 36.0–46.0)
Hemoglobin: 13.3 g/dL (ref 12.0–15.0)
Potassium: 3.7 mmol/L (ref 3.5–5.1)
Sodium: 140 mmol/L (ref 135–145)

## 2014-10-25 SURGERY — LAPAROTOMY, FOR LYSIS OF ADHESIONS
Anesthesia: General | Site: Abdomen

## 2014-10-25 MED ORDER — ZOLPIDEM TARTRATE 5 MG PO TABS: 5.0000 mg | ORAL_TABLET | Freq: Every evening | ORAL | Status: DC | PRN

## 2014-10-25 MED ORDER — EVENING PRIMROSE OIL 1000 MG PO CAPS: 2000.0000 mg | ORAL_CAPSULE | Freq: Every day | ORAL | Status: DC

## 2014-10-25 MED ORDER — MAGNESIUM HYDROXIDE 400 MG/5ML PO SUSP: 30.0000 mL | Freq: Every day | ORAL | Status: DC | PRN

## 2014-10-25 MED ORDER — CALCIUM CARBONATE-VITAMIN D 600-400 MG-UNIT PO TABS: 1.0000 | ORAL_TABLET | Freq: Every day | ORAL | Status: DC

## 2014-10-25 MED ORDER — ONDANSETRON HCL 4 MG/2ML IJ SOLN
INTRAMUSCULAR | Status: AC
  Filled 2014-10-25: qty 2

## 2014-10-25 MED ORDER — PROPOFOL 10 MG/ML IV BOLUS
INTRAVENOUS | Status: AC
  Filled 2014-10-25: qty 20

## 2014-10-25 MED ORDER — GLYCOPYRROLATE 0.2 MG/ML IJ SOLN
INTRAMUSCULAR | Status: DC | PRN
  Administered 2014-10-25: 0.6 mg via INTRAVENOUS

## 2014-10-25 MED ORDER — PROMETHAZINE HCL 25 MG/ML IJ SOLN
6.2500 mg | INTRAMUSCULAR | Status: DC | PRN
  Administered 2014-10-25: 6.25 mg via INTRAVENOUS

## 2014-10-25 MED ORDER — ROCURONIUM BROMIDE 50 MG/5ML IV SOLN
INTRAVENOUS | Status: AC
  Filled 2014-10-25: qty 2

## 2014-10-25 MED ORDER — DEXAMETHASONE SODIUM PHOSPHATE 4 MG/ML IJ SOLN
INTRAMUSCULAR | Status: DC | PRN
  Administered 2014-10-25: 8 mg via INTRAVENOUS

## 2014-10-25 MED ORDER — LOSARTAN POTASSIUM 50 MG PO TABS
50.0000 mg | ORAL_TABLET | Freq: Every day | ORAL | Status: DC
  Administered 2014-10-25 – 2014-10-30 (×7): 50 mg via ORAL
  Filled 2014-10-25 (×6): qty 1

## 2014-10-25 MED ORDER — POTASSIUM CL IN DEXTROSE 5% 20 MEQ/L IV SOLN
20.0000 meq | INTRAVENOUS | Status: DC
  Administered 2014-10-25 – 2014-10-27 (×4): 20 meq via INTRAVENOUS
  Filled 2014-10-25 (×8): qty 1000

## 2014-10-25 MED ORDER — OXYCODONE-ACETAMINOPHEN 5-325 MG PO TABS
ORAL_TABLET | ORAL | Status: AC
  Administered 2014-10-25: 2 via ORAL
  Filled 2014-10-25: qty 2

## 2014-10-25 MED ORDER — OLOPATADINE HCL 0.7 % OP SOLN: 1.0000 [drp] | Freq: Every day | OPHTHALMIC | Status: DC | PRN

## 2014-10-25 MED ORDER — DIPHENHYDRAMINE HCL 50 MG/ML IJ SOLN: 12.5000 mg | Freq: Four times a day (QID) | INTRAMUSCULAR | Status: DC | PRN

## 2014-10-25 MED ORDER — GLYCOPYRROLATE 0.2 MG/ML IJ SOLN
INTRAMUSCULAR | Status: AC
  Filled 2014-10-25: qty 3

## 2014-10-25 MED ORDER — LIDOCAINE HCL (CARDIAC) 20 MG/ML IV SOLN
INTRAVENOUS | Status: DC | PRN
  Administered 2014-10-25: 50 mg via INTRAVENOUS

## 2014-10-25 MED ORDER — HYDROCHLOROTHIAZIDE 25 MG PO TABS
25.0000 mg | ORAL_TABLET | Freq: Every day | ORAL | Status: DC
  Administered 2014-10-26 – 2014-10-30 (×6): 25 mg via ORAL
  Filled 2014-10-25 (×5): qty 1

## 2014-10-25 MED ORDER — HYDROMORPHONE HCL 1 MG/ML IJ SOLN
INTRAMUSCULAR | Status: AC
  Administered 2014-10-25: 1 mg via INTRAVENOUS
  Filled 2014-10-25: qty 1

## 2014-10-25 MED ORDER — FENTANYL CITRATE (PF) 100 MCG/2ML IJ SOLN
INTRAMUSCULAR | Status: DC | PRN
  Administered 2014-10-25: 100 ug via INTRAVENOUS
  Administered 2014-10-25: 50 ug via INTRAVENOUS
  Administered 2014-10-25 (×2): 100 ug via INTRAVENOUS

## 2014-10-25 MED ORDER — CYCLOSPORINE 0.05 % OP EMUL
1.0000 [drp] | Freq: Two times a day (BID) | OPHTHALMIC | Status: DC
  Administered 2014-10-25 – 2014-10-30 (×10): 1 [drp] via OPHTHALMIC
  Filled 2014-10-25 (×11): qty 1

## 2014-10-25 MED ORDER — PHENAZOPYRIDINE HCL 200 MG PO TABS
200.0000 mg | ORAL_TABLET | Freq: Three times a day (TID) | ORAL | Status: DC | PRN
  Filled 2014-10-25: qty 1

## 2014-10-25 MED ORDER — MECLIZINE HCL 25 MG PO TABS
25.0000 mg | ORAL_TABLET | Freq: Three times a day (TID) | ORAL | Status: DC | PRN
  Filled 2014-10-25 (×2): qty 1

## 2014-10-25 MED ORDER — OXYCODONE-ACETAMINOPHEN 5-325 MG PO TABS
1.0000 | ORAL_TABLET | ORAL | Status: DC | PRN
  Administered 2014-10-25: 2 via ORAL
  Administered 2014-10-26 (×4): 1 via ORAL
  Administered 2014-10-27: 2 via ORAL
  Administered 2014-10-27 – 2014-10-30 (×11): 1 via ORAL
  Filled 2014-10-25: qty 2
  Filled 2014-10-25: qty 1
  Filled 2014-10-25: qty 2
  Filled 2014-10-25 (×8): qty 1
  Filled 2014-10-25 (×2): qty 2
  Filled 2014-10-25 (×4): qty 1

## 2014-10-25 MED ORDER — PHENYLEPHRINE HCL 10 MG/ML IJ SOLN
INTRAMUSCULAR | Status: AC
  Filled 2014-10-25: qty 2

## 2014-10-25 MED ORDER — PANTOPRAZOLE SODIUM 40 MG PO TBEC
40.0000 mg | DELAYED_RELEASE_TABLET | Freq: Every day | ORAL | Status: DC
  Administered 2014-10-26 – 2014-10-30 (×5): 40 mg via ORAL
  Filled 2014-10-25 (×5): qty 1

## 2014-10-25 MED ORDER — ALBUTEROL SULFATE HFA 108 (90 BASE) MCG/ACT IN AERS: 2.0000 | INHALATION_SPRAY | Freq: Four times a day (QID) | RESPIRATORY_TRACT | Status: DC | PRN

## 2014-10-25 MED ORDER — FENTANYL CITRATE (PF) 250 MCG/5ML IJ SOLN
INTRAMUSCULAR | Status: AC
  Filled 2014-10-25: qty 5

## 2014-10-25 MED ORDER — ONDANSETRON 4 MG PO TBDP: 4.0000 mg | ORAL_TABLET | Freq: Four times a day (QID) | ORAL | Status: DC | PRN

## 2014-10-25 MED ORDER — ALBUTEROL SULFATE (2.5 MG/3ML) 0.083% IN NEBU: 2.5000 mg | INHALATION_SOLUTION | Freq: Four times a day (QID) | RESPIRATORY_TRACT | Status: DC | PRN

## 2014-10-25 MED ORDER — ADULT MULTIVITAMIN W/MINERALS CH
1.0000 | ORAL_TABLET | Freq: Every day | ORAL | Status: DC
  Administered 2014-10-26 – 2014-10-30 (×5): 1 via ORAL
  Filled 2014-10-25 (×5): qty 1

## 2014-10-25 MED ORDER — LORATADINE 10 MG PO TABS: 10.0000 mg | ORAL_TABLET | Freq: Every day | ORAL | Status: DC | PRN

## 2014-10-25 MED ORDER — LACTATED RINGERS IV SOLN
INTRAVENOUS | Status: DC | PRN
  Administered 2014-10-25 (×3): via INTRAVENOUS

## 2014-10-25 MED ORDER — PHENYLEPHRINE HCL 10 MG/ML IJ SOLN
INTRAMUSCULAR | Status: DC | PRN
  Administered 2014-10-25: 40 ug via INTRAVENOUS

## 2014-10-25 MED ORDER — ACETAMINOPHEN 500 MG PO TABS
1000.0000 mg | ORAL_TABLET | Freq: Four times a day (QID) | ORAL | Status: DC | PRN
  Administered 2014-10-26 – 2014-10-27 (×3): 1000 mg via ORAL
  Administered 2014-10-27: 500 mg via ORAL
  Administered 2014-10-28 (×2): 1000 mg via ORAL
  Filled 2014-10-25 (×6): qty 2

## 2014-10-25 MED ORDER — ONDANSETRON HCL 4 MG/2ML IJ SOLN: 4.0000 mg | Freq: Four times a day (QID) | INTRAMUSCULAR | Status: DC | PRN

## 2014-10-25 MED ORDER — FLUTICASONE PROPIONATE 50 MCG/ACT NA SUSP: 1.0000 | Freq: Two times a day (BID) | NASAL | Status: DC | PRN

## 2014-10-25 MED ORDER — METOPROLOL SUCCINATE ER 25 MG PO TB24
100.0000 mg | ORAL_TABLET | Freq: Every day | ORAL | Status: DC
  Administered 2014-10-26 – 2014-10-30 (×5): 100 mg via ORAL
  Filled 2014-10-25 (×5): qty 4

## 2014-10-25 MED ORDER — SIMETHICONE 80 MG PO CHEW: 40.0000 mg | CHEWABLE_TABLET | Freq: Four times a day (QID) | ORAL | Status: DC | PRN

## 2014-10-25 MED ORDER — EPHEDRINE SULFATE 50 MG/ML IJ SOLN
INTRAMUSCULAR | Status: DC | PRN
  Administered 2014-10-25 (×4): 10 mg via INTRAVENOUS

## 2014-10-25 MED ORDER — BUPROPION HCL ER (XL) 150 MG PO TB24
300.0000 mg | ORAL_TABLET | Freq: Every day | ORAL | Status: DC
  Administered 2014-10-26 – 2014-10-30 (×5): 300 mg via ORAL
  Filled 2014-10-25 (×5): qty 2

## 2014-10-25 MED ORDER — NEOSTIGMINE METHYLSULFATE 10 MG/10ML IV SOLN
INTRAVENOUS | Status: AC
  Filled 2014-10-25: qty 1

## 2014-10-25 MED ORDER — POTASSIUM CHLORIDE CRYS ER 10 MEQ PO TBCR
10.0000 meq | EXTENDED_RELEASE_TABLET | Freq: Once | ORAL | Status: AC
  Administered 2014-10-25: 10 meq via ORAL
  Filled 2014-10-25: qty 1

## 2014-10-25 MED ORDER — CALCIUM CARBONATE-VITAMIN D 500-200 MG-UNIT PO TABS
1.0000 | ORAL_TABLET | Freq: Every day | ORAL | Status: DC
  Administered 2014-10-26 – 2014-10-30 (×5): 1 via ORAL
  Filled 2014-10-25 (×5): qty 1

## 2014-10-25 MED ORDER — DIAZEPAM 5 MG PO TABS: 2.5000 mg | ORAL_TABLET | Freq: Four times a day (QID) | ORAL | Status: DC | PRN

## 2014-10-25 MED ORDER — DIPHENHYDRAMINE HCL 12.5 MG/5ML PO ELIX: 12.5000 mg | ORAL_SOLUTION | Freq: Four times a day (QID) | ORAL | Status: DC | PRN

## 2014-10-25 MED ORDER — ENOXAPARIN SODIUM 40 MG/0.4ML ~~LOC~~ SOLN
40.0000 mg | SUBCUTANEOUS | Status: DC
  Administered 2014-10-26 – 2014-10-30 (×5): 40 mg via SUBCUTANEOUS
  Filled 2014-10-25 (×5): qty 0.4

## 2014-10-25 MED ORDER — CO Q 10 100 MG PO CAPS: 100.0000 mg | ORAL_CAPSULE | Freq: Every day | ORAL | Status: DC

## 2014-10-25 MED ORDER — 0.9 % SODIUM CHLORIDE (POUR BTL) OPTIME
TOPICAL | Status: DC | PRN
  Administered 2014-10-25 (×4): 1000 mL

## 2014-10-25 MED ORDER — OMEPRAZOLE MAGNESIUM 20 MG PO TBEC: 20.0000 mg | DELAYED_RELEASE_TABLET | Freq: Every day | ORAL | Status: DC

## 2014-10-25 MED ORDER — MONTELUKAST SODIUM 10 MG PO TABS: 10.0000 mg | ORAL_TABLET | Freq: Every day | ORAL | Status: DC | PRN

## 2014-10-25 MED ORDER — MIDAZOLAM HCL 2 MG/2ML IJ SOLN
INTRAMUSCULAR | Status: AC
  Filled 2014-10-25: qty 4

## 2014-10-25 MED ORDER — SIMVASTATIN 40 MG PO TABS
40.0000 mg | ORAL_TABLET | Freq: Every day | ORAL | Status: DC
  Administered 2014-10-25 – 2014-10-29 (×5): 40 mg via ORAL
  Filled 2014-10-25 (×5): qty 1

## 2014-10-25 MED ORDER — FENTANYL CITRATE (PF) 100 MCG/2ML IJ SOLN
INTRAMUSCULAR | Status: AC
  Administered 2014-10-25: 50 ug via INTRAVENOUS
  Filled 2014-10-25: qty 2

## 2014-10-25 MED ORDER — DEXAMETHASONE SODIUM PHOSPHATE 4 MG/ML IJ SOLN
INTRAMUSCULAR | Status: AC
  Filled 2014-10-25: qty 2

## 2014-10-25 MED ORDER — FENTANYL CITRATE (PF) 100 MCG/2ML IJ SOLN
25.0000 ug | INTRAMUSCULAR | Status: DC | PRN
  Administered 2014-10-25 (×4): 50 ug via INTRAVENOUS

## 2014-10-25 MED ORDER — NEOSTIGMINE METHYLSULFATE 10 MG/10ML IV SOLN
INTRAVENOUS | Status: DC | PRN
  Administered 2014-10-25: 5 mg via INTRAVENOUS

## 2014-10-25 MED ORDER — IBUPROFEN 400 MG PO TABS
400.0000 mg | ORAL_TABLET | Freq: Four times a day (QID) | ORAL | Status: DC | PRN
  Administered 2014-10-28: 600 mg via ORAL
  Administered 2014-10-29 – 2014-10-30 (×5): 400 mg via ORAL
  Filled 2014-10-25 (×3): qty 1
  Filled 2014-10-25: qty 2
  Filled 2014-10-25 (×2): qty 1

## 2014-10-25 MED ORDER — ROCURONIUM BROMIDE 100 MG/10ML IV SOLN
INTRAVENOUS | Status: DC | PRN
  Administered 2014-10-25 (×2): 10 mg via INTRAVENOUS
  Administered 2014-10-25: 50 mg via INTRAVENOUS

## 2014-10-25 MED ORDER — CHLORHEXIDINE GLUCONATE 4 % EX LIQD: 1.0000 "application " | Freq: Once | CUTANEOUS | Status: DC

## 2014-10-25 MED ORDER — OLOPATADINE HCL 0.1 % OP SOLN
1.0000 [drp] | Freq: Every day | OPHTHALMIC | Status: DC | PRN
  Administered 2014-10-25: 1 [drp] via OPHTHALMIC
  Filled 2014-10-25: qty 5

## 2014-10-25 MED ORDER — VITAMIN D (ERGOCALCIFEROL) 1.25 MG (50000 UNIT) PO CAPS: 50000.0000 [IU] | ORAL_CAPSULE | ORAL | Status: DC

## 2014-10-25 MED ORDER — EPHEDRINE SULFATE 50 MG/ML IJ SOLN
INTRAMUSCULAR | Status: AC
  Filled 2014-10-25: qty 1

## 2014-10-25 MED ORDER — HYDROMORPHONE HCL 1 MG/ML IJ SOLN
1.0000 mg | INTRAMUSCULAR | Status: DC | PRN
  Administered 2014-10-25 (×2): 1 mg via INTRAVENOUS
  Filled 2014-10-25: qty 1

## 2014-10-25 MED ORDER — ONDANSETRON HCL 4 MG/2ML IJ SOLN
INTRAMUSCULAR | Status: DC | PRN
  Administered 2014-10-25: 4 mg via INTRAVENOUS

## 2014-10-25 MED ORDER — LIDOCAINE HCL (CARDIAC) 20 MG/ML IV SOLN
INTRAVENOUS | Status: AC
  Filled 2014-10-25: qty 5

## 2014-10-25 MED ORDER — FLAXSEED (LINSEED) 1000 MG PO CAPS: 2000.0000 mg | ORAL_CAPSULE | Freq: Every day | ORAL | Status: DC

## 2014-10-25 MED ORDER — PROPOFOL 10 MG/ML IV BOLUS
INTRAVENOUS | Status: DC | PRN
  Administered 2014-10-25: 200 mg via INTRAVENOUS

## 2014-10-25 MED ORDER — PROMETHAZINE HCL 25 MG/ML IJ SOLN
INTRAMUSCULAR | Status: AC
  Administered 2014-10-25: 6.25 mg via INTRAVENOUS
  Filled 2014-10-25: qty 1

## 2014-10-25 MED ORDER — SUCCINYLCHOLINE CHLORIDE 20 MG/ML IJ SOLN
INTRAMUSCULAR | Status: DC | PRN
  Administered 2014-10-25: 100 mg via INTRAVENOUS

## 2014-10-25 SURGICAL SUPPLY — 52 items
BLADE SURG 11 STRL SS (BLADE) ×2 IMPLANT
BLADE SURG ROTATE 9660 (MISCELLANEOUS) ×2 IMPLANT
COVER SURGICAL LIGHT HANDLE (MISCELLANEOUS) ×2 IMPLANT
DERMABOND ADVANCED (GAUZE/BANDAGES/DRESSINGS) ×2
DERMABOND ADVANCED .7 DNX12 (GAUZE/BANDAGES/DRESSINGS) ×2 IMPLANT
DEVICE TROCAR PUNCTURE CLOSURE (ENDOMECHANICALS) ×2 IMPLANT
DRAIN CHANNEL 19F RND (DRAIN) ×2 IMPLANT
DRAPE INCISE IOBAN 66X45 STRL (DRAPES) ×2 IMPLANT
DRAPE LAPAROSCOPIC ABDOMINAL (DRAPES) ×2 IMPLANT
ELECT CAUTERY BLADE 6.4 (BLADE) ×2 IMPLANT
ELECT REM PT RETURN 9FT ADLT (ELECTROSURGICAL) ×2
ELECTRODE REM PT RTRN 9FT ADLT (ELECTROSURGICAL) ×1 IMPLANT
EVACUATOR SILICONE 100CC (DRAIN) ×2 IMPLANT
GAUZE SPONGE 4X4 12PLY STRL (GAUZE/BANDAGES/DRESSINGS) ×2 IMPLANT
GLOVE BIO SURGEON STRL SZ7 (GLOVE) ×2 IMPLANT
GLOVE BIO SURGEON STRL SZ7.5 (GLOVE) ×4 IMPLANT
GLOVE BIOGEL PI IND STRL 7.0 (GLOVE) ×1 IMPLANT
GLOVE BIOGEL PI IND STRL 7.5 (GLOVE) ×1 IMPLANT
GLOVE BIOGEL PI IND STRL 8 (GLOVE) ×2 IMPLANT
GLOVE BIOGEL PI INDICATOR 7.0 (GLOVE) ×1
GLOVE BIOGEL PI INDICATOR 7.5 (GLOVE) ×1
GLOVE BIOGEL PI INDICATOR 8 (GLOVE) ×2
GLOVE EUDERMIC 7 POWDERFREE (GLOVE) ×2 IMPLANT
GOWN STRL REUS W/ TWL LRG LVL3 (GOWN DISPOSABLE) ×3 IMPLANT
GOWN STRL REUS W/ TWL XL LVL3 (GOWN DISPOSABLE) ×2 IMPLANT
GOWN STRL REUS W/TWL LRG LVL3 (GOWN DISPOSABLE) ×3
GOWN STRL REUS W/TWL XL LVL3 (GOWN DISPOSABLE) ×2
KIT BASIN OR (CUSTOM PROCEDURE TRAY) ×2 IMPLANT
KIT ROOM TURNOVER OR (KITS) ×2 IMPLANT
MARKER SKIN DUAL TIP RULER LAB (MISCELLANEOUS) ×2 IMPLANT
MESH VENTRALIGHT ST 8X10 (Mesh General) ×2 IMPLANT
NS IRRIG 1000ML POUR BTL (IV SOLUTION) ×2 IMPLANT
PACK GENERAL/GYN (CUSTOM PROCEDURE TRAY) ×2 IMPLANT
PAD ARMBOARD 7.5X6 YLW CONV (MISCELLANEOUS) ×4 IMPLANT
RETAINER VISCERA MED (MISCELLANEOUS) ×2 IMPLANT
SPONGE LAP 18X18 X RAY DECT (DISPOSABLE) ×4 IMPLANT
STAPLER VISISTAT 35W (STAPLE) ×2 IMPLANT
SUT ETHILON 3 0 FSL (SUTURE) ×2 IMPLANT
SUT MNCRL AB 4-0 PS2 18 (SUTURE) ×4 IMPLANT
SUT NOVA NAB GS-21 0 18 T12 DT (SUTURE) IMPLANT
SUT PDS AB 0 CT 36 (SUTURE) IMPLANT
SUT PDS AB 2-0 CT1 27 (SUTURE) ×10 IMPLANT
SUT PROLENE 0 CT 1 30 (SUTURE) ×2 IMPLANT
SUT PROLENE 0 CT 1 CR/8 (SUTURE) ×2 IMPLANT
SUT PROLENE 2 0 CT 30 (SUTURE) ×4 IMPLANT
SUT SILK 0 FSL (SUTURE) ×4 IMPLANT
SUT SILK 0 TIES 10X30 (SUTURE) ×2 IMPLANT
SUT VIC AB 3-0 SH 18 (SUTURE) ×2 IMPLANT
SUT VICRYL AB 2 0 TIES (SUTURE) ×2 IMPLANT
TOWEL OR 17X24 6PK STRL BLUE (TOWEL DISPOSABLE) ×2 IMPLANT
TOWEL OR 17X26 10 PK STRL BLUE (TOWEL DISPOSABLE) ×2 IMPLANT
TRAY FOLEY CATH 16FR SILVER (SET/KITS/TRAYS/PACK) ×2 IMPLANT

## 2014-10-25 NOTE — Progress Notes (Signed)
Orthopedic Tech Progress Note Patient Details:  Cynthia Peters 1950-06-16 [id number]  Ortho Devices Type of Ortho Device: Abdominal binder Ortho Device/Splint Location: abdomen Ortho Device/Splint Interventions: Ordered  Viewed order from RN order list Hildred Priest 10/25/2014, 12:17 PM

## 2014-10-25 NOTE — Anesthesia Preprocedure Evaluation (Signed)
Anesthesia Evaluation  Patient identified by MRN, date of birth, ID band Patient awake    Reviewed: NPO status , Patient's Chart, lab work & pertinent test results  Airway Mallampati: II  TM Distance: >3 FB Neck ROM: Full    Dental   Pulmonary asthma , former smoker,    breath sounds clear to auscultation       Cardiovascular hypertension,  Rhythm:Regular Rate:Normal     Neuro/Psych    GI/Hepatic Neg liver ROS, hiatal hernia, GERD  ,  Endo/Other  negative endocrine ROS  Renal/GU negative Renal ROS     Musculoskeletal   Abdominal   Peds  Hematology   Anesthesia Other Findings   Reproductive/Obstetrics                             Anesthesia Physical Anesthesia Plan  ASA: III  Anesthesia Plan: General   Post-op Pain Management:    Induction:   Airway Management Planned: Oral ETT  Additional Equipment:   Intra-op Plan:   Post-operative Plan: Possible Post-op intubation/ventilation  Informed Consent: I have reviewed the patients History and Physical, chart, labs and discussed the procedure including the risks, benefits and alternatives for the proposed anesthesia with the patient or authorized representative who has indicated his/her understanding and acceptance.   Dental advisory given  Plan Discussed with: CRNA and Anesthesiologist  Anesthesia Plan Comments:         Anesthesia Quick Evaluation

## 2014-10-25 NOTE — Progress Notes (Signed)
Abdominal binder applied as ordered

## 2014-10-25 NOTE — Op Note (Signed)
10/25/2014  11:13 AM  PATIENT:  Esmond Camper A Schaad  64 y.o. female  PRE-OPERATIVE DIAGNOSIS:  Recurrent Incisional Hernia   POST-OPERATIVE DIAGNOSIS:  Recurrent Incisional Hernia   PROCEDURE:  Procedure(s): Exploratory laparotomy Lysis of adhesions  Explantation of previous mesh Bilateral posterior musculocutaneous flaps Hernia repair with mesh   SURGEON:  Surgeon(s) and Role:    * Ralene Ok, MD - Primary    * Fanny Skates, MD - Assisting   ANESTHESIA:   general  EBL:  Total I/O In: 2000 [I.V.:2000] Out: 300 [Urine:200; Blood:100]  BLOOD ADMINISTERED:none  DRAINS: (19Fr) Jackson-Pratt drain(s) with closed bulb suction in the subfascial plane above the mesh   LOCAL MEDICATIONS USED:  NONE  SPECIMEN:  No Specimen  DISPOSITION OF SPECIMEN:  N/A  COUNTS:  YES  TOURNIQUET:  * No tourniquets in log *  DICTATION: .Dragon Dictation After the patient was consented she was taken back to the operating room and placed in the supine position with bilateral SCDs in place. Patient underwent general endotracheal anesthesia. A Foley catheter was placed. The patient was then prepped and draped in the usual sterile fashion.  A timeout was called all facts verified.  A midline incision was made using a 10 blade. Electrocautery was used to maintain hemostasis and dissection was taken down to the midline fascia. This was incised and the abdomen was entered sharply. We entered the abdomen just superior to the mesh placement. The mesh was easily circumferentially dissected away from the surrounding peritoneum. On the left portion of the mesh placement this was more adherent to the peritoneum and posterior fascia was removed en bloc.  Adhesions from posterior to the mesh were undertaken using electrocautery to maintain hemostasis. Once the mass was excised, we were able to visualize the defect and the main hernia sac was to the right portion of the midline wound. At this time I  proceeded to dissect out the posterior rectus sheath on the left side. This again was more ectatic and fragile. Upon releasing the posterior rectus sheath on the incised the transversus abdominis muscle and deep and lateral to the posterior rectus sheath. We proceeded cephalad towards the costal margin. This was easily identified and bluntly dissected away from the midline. Inferiorly from approximately the umbilicus to the suprapubic area there was a thinner posterior rectus sheath and thinner transversus abdominis sheath. We were able to digitally dissect this back. At the end of the left-sided dissection there was approximately a 6-8 cm distance from the midline.  We turned our attention to the right side. Again the same dissection was undertaken. The right-sided transversus abdominis sheath was more delineated and less fragile. This was able to be dissected back to approximately level of the ASIS. We connected the posterior dissection both superiorly and inferiorly. The inferior and superior portions allowed [place] approximately 5-6 cm. At this time the posterior rectus sheath reapproximated using 2-0 PDS 2 in a running standard fashion.   A piece of 20.3 x 25.4 cm Bard of Ventralight ST mesh was chosen and placed into the posterior dissection plane. This was anchored trans-fascial stitches were placed using an Endo Close device and oh Prolenes. 3 stitches were each  placed trans-fascially bilaterally. Into the superior and inferior portions. The mesh lay taut and flat.  We irrigated out this plane with sterile saline. At this time a 79 [place] JP drain was placed just superficial to the mesh. This was brought out through a stab incision in the left lower quadrant.  This was secured using a 3-0 nylon. At this time the anterior fascia was reapproximated using 2-0 PDS 2 in  a standard running fashion.  At this time the skin was reapproximated using 4-0 Monocryl in a subcuticular fashion. The skin and the stab  wound incisions were reapproximated and dressed with Dermabond. The patient tolerated the procedure well was taken to the recovery room in stable condition.  PLAN OF CARE: Admit to inpatient   PATIENT DISPOSITION:  PACU - hemodynamically stable.   Delay start of Pharmacological VTE agent (>24hrs) due to surgical blood loss or risk of bleeding: not applicable

## 2014-10-25 NOTE — H&P (Signed)
The patient is a 64 year old female who presents with an incisional hernia. Patient is a 64 year old female who is referred by Bing Matter, PA-C for evaluation of recurrent incisional hernia. Patient states she's had this previously fix both primarily laparoscopically 2013. She states that this bulge is initially been seen approximately 2 years ago. She was recently ER secondary to abdominal pain. Patient was CT scan which revealed a recurrent hernia with the mesh pulled away from the right side of the abdominal wall. Patient has a bulge to the right abdominal wall.   Other Problems Yehuda Mao, RMA; 09/14/2014 10:58 AM) Bladder Problems Gastroesophageal Reflux Disease Hemorrhoids High blood pressure Inguinal Hernia Umbilical Hernia Repair Ventral Hernia Repair  Past Surgical History Yehuda Mao, RMA; 09/14/2014 10:58 AM) Cesarean Section - 1 Oral Surgery Ventral / Umbilical Hernia Surgery multiple  Diagnostic Studies History Yehuda Mao, RMA; 09/14/2014 10:58 AM) Colonoscopy within last year Pap Smear 1-5 years ago  Allergies Shirlean Mylar Gwynn, RMA; 09/14/2014 11:00 AM) HYDROcodone Bitartrate *CHEMICALS* TraMADol HCl *ANALGESICS - OPIOID*  Medication History (Robin Gwynn, RMA; 09/14/2014 11:02 AM) Diazepam (5MG  Tablet, Oral) Active. Zolpidem Tartrate (10MG  Tablet, Oral) Active. BuPROPion HCl ER (XL) (300MG  Tablet ER 24HR, Oral) Active. Dexamethasone (0.5MG /5ML Solution, Oral) Active. Fluconazole (150MG  Tablet, Oral) Active. Fluorometholone (0.1% Suspension, Ophthalmic) Active. Hydrochlorothiazide (25MG  Tablet, Oral) Active. Ketorolac Tromethamine (10MG  Tablet, Oral) Active. Klor-Con M10 (10MEQ Tablet ER, Oral) Active. Klor-Con M20 [facility] Tablet ER, Oral) Active. Losartan Potassium (50MG  Tablet, Oral) Active. Lotemax (0.5% Suspension, Ophthalmic) Active. Metoprolol Succinate ER (50MG  Tablet ER 24HR, Oral) Active. Ondansetron HCl (8MG  Tablet, Oral)  Active. Pazeo (0.7% Solution, Ophthalmic) Active. Phenazopyridine HCl (200MG  Tablet, Oral) Active. PreviDent 5000 Dry Mouth (1.1% Gel, Dental) Active. Restasis (0.05% Emulsion, Ophthalmic) Active. Simvastatin (40MG  Tablet, Oral) Active. ValACYclovir HCl (500MG  Tablet, Oral) Active. Ventolin HFA (108 (90 Base)MCG/ACT Aerosol Soln, Inhalation) Active. Vitamin D (Ergocalciferol) (50000UNIT Capsule, Oral) Active. Medications Reconciled  Social History Yehuda Mao, RMA; 09/14/2014 10:58 AM) Alcohol use Occasional alcohol use. Caffeine use Carbonated beverages, Coffee. No drug use Tobacco use Former smoker.  Family History Yehuda Mao, RMA; 09/14/2014 10:58 AM) Arthritis Mother. Heart Disease Mother. Heart disease in female family member before age 87 Hypertension Mother, Son. Migraine Headache Son.  Pregnancy / Birth History Yehuda Mao, RMA; 09/14/2014 10:58 AM) Age at menarche 25 years. Age of menopause 68-60 Gravida 3 Maternal age 42-25 Para 3  Review of Systems Yehuda Mao RMA; 09/14/2014 10:58 AM) General Not Present- Appetite Loss, Chills, Fatigue, Fever, Night Sweats, Weight Gain and Weight Loss. Skin Not Present- Change in Wart/Mole, Dryness, Hives, Jaundice, New Lesions, Non-Healing Wounds, Rash and Ulcer. HEENT Present- Visual Disturbances and Wears glasses/contact lenses. Not Present- Earache, Hearing Loss, Hoarseness, Nose Bleed, Oral Ulcers, Ringing in the Ears, Seasonal Allergies, Sinus Pain, Sore Throat and Yellow Eyes. Respiratory Not Present- Bloody sputum, Chronic Cough, Difficulty Breathing, Snoring and Wheezing. Breast Not Present- Breast Mass, Breast Pain, Nipple Discharge and Skin Changes. Cardiovascular Present- Palpitations. Not Present- Chest Pain, Difficulty Breathing Lying Down, Leg Cramps, Rapid Heart Rate, Shortness of Breath and Swelling of Extremities. Gastrointestinal Present- Abdominal Pain, Bloating, Hemorrhoids and  Indigestion. Not Present- Bloody Stool, Change in Bowel Habits, Chronic diarrhea, Constipation, Difficulty Swallowing, Excessive gas, Gets full quickly at meals, Nausea, Rectal Pain and Vomiting. Female Genitourinary Present- Frequency, Pelvic Pain and Urgency. Not Present- Nocturia and Painful Urination. Musculoskeletal Present- Back Pain, Joint Pain, Muscle Pain and Swelling of Extremities. Not Present- Joint Stiffness and Muscle Weakness. Neurological Present- Tingling.  Not Present- Decreased Memory, Fainting, Headaches, Numbness, Seizures, Tremor, Trouble walking and Weakness. Psychiatric Not Present- Anxiety, Bipolar, Change in Sleep Pattern, Depression, Fearful and Frequent crying. Hematology Present- Easy Bruising. Not Present- Excessive bleeding, Gland problems, HIV and Persistent Infections.   Vitals (Robin Gwynn RMA; 09/14/2014 11:03 AM) 09/14/2014 11:02 AM Weight: 162 lb Height: 64in Body Surface Area: 1.82 m Body Mass Index: 27.81 kg/m Temp.: 98.57F  Pulse: 72 (Regular)  BP: 144/86 (Sitting, Left Arm, Standard)    Physical Exam Ralene Ok MD; 09/14/2014 11:27 AM) General Mental Status-Alert. General Appearance-Consistent with stated age. Hydration-Well hydrated. Voice-Normal.  Head and Neck Head-normocephalic, atraumatic with no lesions or palpable masses. Trachea-midline. Thyroid Gland Characteristics - normal size and consistency.  Chest and Lung Exam Chest and lung exam reveals -quiet, even and easy respiratory effort with no use of accessory muscles and on auscultation, normal breath sounds, no adventitious sounds and normal vocal resonance. Inspection Chest Wall - Normal. Back - normal.  Cardiovascular Cardiovascular examination reveals -normal heart sounds, regular rate and rhythm with no murmurs and normal pedal pulses bilaterally.  Abdomen Inspection Skin - Scar: Note: Midline periumbilical. Hernias - Incisional -  Reducible. Note: Patient has a large midline abdominal defect on physical exam, supraumbilically, approximately 8 cm. Palpation/Percussion Palpation and Percussion of the abdomen reveal - Soft, Non Tender, No Rebound tenderness, No Rigidity (guarding) and No hepatosplenomegaly. Auscultation Auscultation of the abdomen reveals - Bowel sounds normal.  Neurologic Neurologic evaluation reveals -alert and oriented x 3 with no impairment of recent or remote memory. Mental Status-Normal.    Assessment & Plan Ralene Ok MD; 09/14/2014 11:29 AM) RECURRENT INCISIONAL HERNIA (553.21  K43.2) Impression: Patient is a 64 year old female with a recurrent incisional hernia, large.  1. We will proceed with TAR hernia repair with mesh 2. All risks and benefits were discussed with the patient, to generally include infection, bleeding, damage to surrounding structures, acute and chronic nerve pain, and recurrence. Alternatives were offered and described.  All questions were answered and the patient voiced understanding of the procedure and wishes to proceed at this point.

## 2014-10-25 NOTE — Transfer of Care (Signed)
Immediate Anesthesia Transfer of Care Note  Patient: Cynthia Peters  Procedure(s) Performed: Procedure(s): LYSIS OF ADHESIONS  (N/A) HERNIA REPAIR INCISIONAL (N/A) EXPLORATORY LAPAROTOMY (N/A) MUCOSAL ADVANCEMENT FLAP (N/A) INSERTION OF MESH (N/A)  Patient Location: PACU  Anesthesia Type:General  Level of Consciousness: awake, alert , oriented, patient cooperative and responds to stimulation  Airway & Oxygen Therapy: Patient Spontanous Breathing and Patient connected to nasal cannula oxygen  Post-op Assessment: Report given to RN, Post -op Vital signs reviewed and stable, Patient moving all extremities X 4 and Patient able to stick tongue midline  Post vital signs: Reviewed and stable  Last Vitals:  Filed Vitals:   10/25/14 1125  BP: 116/56  Pulse: 71  Temp:   Resp: 14    Complications: No apparent anesthesia complications

## 2014-10-25 NOTE — Anesthesia Postprocedure Evaluation (Signed)
  Anesthesia Post-op Note  Patient: Cynthia Peters  Procedure(s) Performed: Procedure(s): LYSIS OF ADHESIONS  (N/A) HERNIA REPAIR INCISIONAL (N/A) EXPLORATORY LAPAROTOMY (N/A) MUCOSAL ADVANCEMENT FLAP (N/A) INSERTION OF MESH (N/A)  Patient Location: PACU  Anesthesia Type:General  Level of Consciousness: awake  Airway and Oxygen Therapy: Patient Spontanous Breathing  Post-op Pain: mild  Post-op Assessment: Post-op Vital signs reviewed              Post-op Vital Signs: Reviewed  Last Vitals:  Filed Vitals:   10/25/14 1230  BP: 151/82  Pulse: 73  Temp:   Resp: 12    Complications: No apparent anesthesia complications

## 2014-10-25 NOTE — Anesthesia Procedure Notes (Signed)
Procedure Name: Intubation Date/Time: 10/25/2014 8:36 AM Performed by: Vennie Homans Pre-anesthesia Checklist: Patient identified, Timeout performed, Emergency Drugs available, Suction available and Patient being monitored Patient Re-evaluated:Patient Re-evaluated prior to inductionOxygen Delivery Method: Circle system utilized Preoxygenation: Pre-oxygenation with 100% oxygen Intubation Type: IV induction Ventilation: Mask ventilation without difficulty Laryngoscope Size: Mac and 3 Grade View: Grade I Tube type: Oral Tube size: 7.0 mm Number of attempts: 1 Airway Equipment and Method: Stylet Placement Confirmation: ETT inserted through vocal cords under direct vision,  positive ETCO2 and breath sounds checked- equal and bilateral Secured at: 21 cm Tube secured with: Tape Dental Injury: Teeth and Oropharynx as per pre-operative assessment  Comments: Gentle laryngoscopy.  Cords open under direct visualization.  Intubation with ease.

## 2014-10-26 ENCOUNTER — Encounter (HOSPITAL_COMMUNITY): Payer: Self-pay | Admitting: General Surgery

## 2014-10-26 LAB — BASIC METABOLIC PANEL
Anion gap: 6 (ref 5–15)
BUN: 8 mg/dL (ref 6–20)
CO2: 29 mmol/L (ref 22–32)
Calcium: 9 mg/dL (ref 8.9–10.3)
Chloride: 100 mmol/L — ABNORMAL LOW (ref 101–111)
Creatinine, Ser: 0.88 mg/dL (ref 0.44–1.00)
GFR calc Af Amer: >60 mL/min (ref >60)
GFR calc non Af Amer: >60 mL/min (ref >60)
Glucose, Bld: 129 mg/dL — ABNORMAL HIGH (ref 65–99)
Potassium: 3.8 mmol/L (ref 3.5–5.1)
Sodium: 135 mmol/L (ref 135–145)

## 2014-10-26 LAB — CBC
HCT: 35.8 % — ABNORMAL LOW (ref 36.0–46.0)
Hemoglobin: 12.4 g/dL (ref 12.0–15.0)
MCH: 33.7 pg (ref 26.0–34.0)
MCHC: 34.6 g/dL (ref 30.0–36.0)
MCV: 97.3 fL (ref 78.0–100.0)
Platelets: 194 10*3/uL (ref 150–400)
RBC: 3.68 MIL/uL — ABNORMAL LOW (ref 3.87–5.11)
RDW: 13 % (ref 11.5–15.5)
WBC: 13.7 10*3/uL — ABNORMAL HIGH (ref 4.0–10.5)

## 2014-10-26 NOTE — Progress Notes (Signed)
1 Day Post-Op  Subjective: PT doing well with min pain this AM.  No n/v  Objective: Vital signs in last 24 hours: Temp:  [98 F (36.7 C)-99.1 F (37.3 C)] 99.1 F (37.3 C) (09/22 8250) Pulse Rate:  [57-77] 71 (09/22 0633) Resp:  [11-21] 16 (09/22 0633) BP: (107-151)/(47-84) 107/47 mmHg (09/22 0633) SpO2:  [97 %-100 %] 97 % (09/22 0633)    Intake/Output from previous day: 09/21 0701 - 09/22 0700 In: 2500 [I.V.:2500] Out: 3315 [Urine:3025; Drains:190; Blood:100] Intake/Output this shift:    General appearance: alert and cooperative Resp: clear to auscultation bilaterally Cardio: regular rate and rhythm, S1, S2 normal, no murmur, click, rub or gallop GI: soft, approp ttp, JP SS, ND, wound c/d/i  Lab Results:   Recent Labs  10/25/14 1550 10/26/14 0529  WBC 16.2* 13.7*  HGB 12.8 12.4  HCT 36.8 35.8*  PLT 179 194   BMET  Recent Labs  10/25/14 0821 10/26/14 0529  NA 140 135  K 3.7 3.8  CL  --  100*  CO2  --  29  GLUCOSE 105* 129*  BUN  --  8  CREATININE  --  0.88  CALCIUM  --  9.0    Anti-infectives: Anti-infectives    Start     Dose/Rate Route Frequency Ordered Stop   10/25/14 0600  ceFAZolin (ANCEF) IVPB 2 g/50 mL premix     2 g 100 mL/hr over 30 Minutes Intravenous On call to O.R. 10/24/14 1307 10/25/14 0825      Assessment/Plan: s/p Procedure(s): LYSIS OF ADHESIONS  (N/A) HERNIA REPAIR INCISIONAL (N/A) EXPLORATORY LAPAROTOMY (N/A) MUCOSAL ADVANCEMENT FLAP (N/A) INSERTION OF MESH (N/A) Advance diet to CLD await return of bowel function prior to advancing further DC foley Mobilize  LOS: 1 day    Rosario Jacks., Anne Hahn 10/26/2014

## 2014-10-26 NOTE — Care Management Note (Signed)
Case Management Note  Patient Details  Name: Cynthia Peters MRN: [id number] Date of Birth: 06/15/1950  Subjective/Objective:                    Action/Plan:  Initial UR completed  Expected Discharge Date:                  Expected Discharge Plan:  Home/Self Care  In-House Referral:     Discharge planning Services     Post Acute Care Choice:    Choice offered to:     DME Arranged:    DME Agency:     HH Arranged:    [address] Agency:     Status of Service:  In process, will continue to follow  Medicare Important Message Given:    Date Medicare IM Given:    Medicare IM give by:    Date Additional Medicare IM Given:    Additional Medicare Important Message give by:     If discussed at [place] of Stay Meetings, dates discussed:    Additional Comments:  Marilu Favre, RN 10/26/2014, 8:01 AM

## 2014-10-26 NOTE — Telephone Encounter (Signed)
Patient notified of results. See result note.//kn 

## 2014-10-27 NOTE — Progress Notes (Signed)
2 Days Post-Op  Subjective: Pt doing well.  No issues.  ambulating well  Objective: Vital signs in last 24 hours: Temp:  [98.7 F (37.1 C)-99.5 F (37.5 C)] 98.7 F (37.1 C) (09/23 1411) Pulse Rate:  [68-80] 80 (09/23 1411) Resp:  [16-17] 16 (09/23 1411) BP: (104-128)/(51-61) 126/60 mmHg (09/23 1411) SpO2:  [95 %-97 %] 95 % (09/23 1411) FiO2 (%):  [2 %] 2 % (09/22 1500) Last BM Date: 10/25/14  Intake/Output from previous day: 09/22 0701 - 09/23 0700 In: 440 [P.O.:440] Out: 1010 [Urine:990; Drains:20] Intake/Output this shift: Total I/O In: 580 [P.O.:580] Out: -   General appearance: alert and cooperative GI: soft, approp ttp, ND, JP SS  Lab Results:   Recent Labs  10/25/14 1550 10/26/14 0529  WBC 16.2* 13.7*  HGB 12.8 12.4  HCT 36.8 35.8*  PLT 179 194   BMET  Recent Labs  10/25/14 0821 10/26/14 0529  NA 140 135  K 3.7 3.8  CL  --  100*  CO2  --  29  GLUCOSE 105* 129*  BUN  --  8  CREATININE  --  0.88  CALCIUM  --  9.0    Anti-infectives: Anti-infectives    Start     Dose/Rate Route Frequency Ordered Stop   10/25/14 0600  ceFAZolin (ANCEF) IVPB 2 g/50 mL premix     2 g 100 mL/hr over 30 Minutes Intravenous On call to O.R. 10/24/14 1307 10/25/14 0825      Assessment/Plan: s/p Procedure(s): LYSIS OF ADHESIONS  (N/A) HERNIA REPAIR INCISIONAL (N/A) EXPLORATORY LAPAROTOMY (N/A) MUCOSAL ADVANCEMENT FLAP (N/A) INSERTION OF MESH (N/A) Advance diet to FLD con't to ambulate as tol Await bowel function  LOS: 2 days    Rosario Jacks., Anne Hahn 10/27/2014

## 2014-10-28 MED ORDER — POTASSIUM CL IN DEXTROSE 5% 20 MEQ/L IV SOLN
20.0000 meq | INTRAVENOUS | Status: DC
  Administered 2014-10-28 – 2014-10-30 (×2): 20 meq via INTRAVENOUS
  Filled 2014-10-28 (×4): qty 1000

## 2014-10-28 NOTE — Progress Notes (Signed)
3 Days Post-Op  Subjective: Pt with no complaints.  Passing flatus.  Ambulating well  Objective: Vital signs in last 24 hours: Temp:  [98.4 F (36.9 C)-98.7 F (37.1 C)] 98.4 F (36.9 C) (09/24 0438) Pulse Rate:  [68-80] 68 (09/24 0438) Resp:  [16-17] 16 (09/24 0438) BP: (120-128)/(49-67) 120/49 mmHg (09/24 0438) SpO2:  [95 %-98 %] 96 % (09/24 0438) Last BM Date: 10/25/14  Intake/Output from previous day: 09/23 0701 - 09/24 0700 In: 1380 [P.O.:1380] Out: 60 [Drains:60] Intake/Output this shift:    General appearance: alert and cooperative GI: soft, approp ttp, ND, incisions c/d/i  Lab Results:   Recent Labs  10/25/14 1550 10/26/14 0529  WBC 16.2* 13.7*  HGB 12.8 12.4  HCT 36.8 35.8*  PLT 179 194   BMET  Recent Labs  10/25/14 0821 10/26/14 0529  NA 140 135  K 3.7 3.8  CL  --  100*  CO2  --  29  GLUCOSE 105* 129*  BUN  --  8  CREATININE  --  0.88  CALCIUM  --  9.0    Anti-infectives: Anti-infectives    Start     Dose/Rate Route Frequency Ordered Stop   10/25/14 0600  ceFAZolin (ANCEF) IVPB 2 g/50 mL premix     2 g 100 mL/hr over 30 Minutes Intravenous On call to O.R. 10/24/14 1307 10/25/14 0825      Assessment/Plan: s/p Procedure(s): LYSIS OF ADHESIONS  (N/A) HERNIA REPAIR INCISIONAL (N/A) EXPLORATORY LAPAROTOMY (N/A) MUCOSAL ADVANCEMENT FLAP (N/A) INSERTION OF MESH (N/A) Advance diet to FLD Mobilize Decrease IVF Home in next 1-2d  LOS: 3 days    Rosario Jacks., [facility] 10/28/2014

## 2014-10-29 LAB — BASIC METABOLIC PANEL
Anion gap: 10 (ref 5–15)
BUN: 14 mg/dL (ref 6–20)
CO2: 27 mmol/L (ref 22–32)
Calcium: 9 mg/dL (ref 8.9–10.3)
Chloride: 98 mmol/L — ABNORMAL LOW (ref 101–111)
Creatinine, Ser: 1.04 mg/dL — ABNORMAL HIGH (ref 0.44–1.00)
GFR calc Af Amer: >60 mL/min (ref >60)
GFR calc non Af Amer: 56 mL/min — ABNORMAL LOW (ref >60)
Glucose, Bld: 122 mg/dL — ABNORMAL HIGH (ref 65–99)
Potassium: 3.6 mmol/L (ref 3.5–5.1)
Sodium: 135 mmol/L (ref 135–145)

## 2014-10-29 NOTE — Progress Notes (Signed)
4 Days Post-Op  Subjective: Pt doing well this AM.  Con't to ambulate.  Tol PO .  Passing flatus  Objective: Vital signs in last 24 hours: Temp:  [98.6 F (37 C)-98.7 F (37.1 C)] 98.7 F (37.1 C) (09/25 0627) Pulse Rate:  [68-76] 68 (09/25 0627) Resp:  [16-20] 20 (09/25 0627) BP: (99-130)/(60-112) 128/70 mmHg (09/25 0627) SpO2:  [98 %-100 %] 100 % (09/25 0627) Last BM Date: 10/25/14  Intake/Output from previous day: 09/24 0701 - 09/25 0700 In: 55 [P.O.:480; I.V.:400] Out: 40 [Drains:40] Intake/Output this shift:    General appearance: alert and cooperative GI: soft, non-tender; bowel sounds normal; no masses,  no organomegaly and incisions c/d/i   Assessment/Plan: s/p Procedure(s): LYSIS OF ADHESIONS  (N/A) HERNIA REPAIR INCISIONAL (N/A) EXPLORATORY LAPAROTOMY (N/A) MUCOSAL ADVANCEMENT FLAP (N/A) INSERTION OF MESH (N/A) Advance diet as tol con't to ambulate Home in next day or so.  LOS: 4 days    Rosario Jacks., [facility] 10/29/2014

## 2014-10-30 MED ORDER — OXYCODONE-ACETAMINOPHEN 5-325 MG PO TABS: 1.0000 | ORAL_TABLET | ORAL | Status: DC | PRN

## 2014-10-30 NOTE — Progress Notes (Signed)
Discharge home. Home discharge instruction given, no question verbalized. 

## 2014-10-30 NOTE — Discharge Instructions (Signed)
Open Hernia Repair, Care After °Refer to this sheet in the next few weeks. These instructions provide you with information on caring for yourself after your procedure. Your health care provider may also give you more specific instructions. Your treatment has been planned according to current medical practices, but problems sometimes occur. Call your health care provider if you have any problems or questions after your procedure. °WHAT TO EXPECT AFTER THE PROCEDURE °After your procedure, it is typical to have the following: °· Pain in your abdomen, especially along your incision. You will be given pain medicines to control the pain. °· Constipation. You may be given a stool softener to help prevent this. °HOME CARE INSTRUCTIONS  °· Only take over-the-counter or prescription medicines as directed by your health care provider. °· Keep the wound dry and clean. You may wash the wound gently with soap and water 48 hours after surgery. Gently blot or dab the wound dry. Do not take baths, use swimming pools, or use hot tubs for 10 days or until your health care provider approves. °· Change bandages (dressings) as directed by your health care provider. °· Continue your normal diet as directed by your health care provider. Eat plenty of fruits and vegetables to help prevent constipation. °· Drink enough fluids to keep your urine clear or pale yellow. This also helps prevent constipation. °· Do not drive until your health care provider says it is okay. °· Do not lift anything heavier than 10 pounds (4.5 kg) or play contact sports for 4 weeks or until your health care provider approves. °· Follow up with your health care provider as directed. Ask your health care provider when to make an appointment to have your stitches (sutures) or staples removed. °SEEK MEDICAL CARE IF:  °· You have increased bleeding coming from the incision site. °· You have blood in your stool. °· You have increasing pain in the wound. °· You see redness  or swelling in the wound. °· You have fluid (pus) coming from the wound. °· You have a fever. °· You notice a bad smell coming from the wound or dressing. °SEEK IMMEDIATE MEDICAL CARE IF:  °· You develop a rash. °· You have chest pain or shortness of breath. °· You feel lightheaded or feel faint. °Document Released: 08/09/2004 Document Revised: 11/10/2012 Document Reviewed: 09/01/2012 °ExitCare® Patient Information ©2015 [employer]. This information is not intended to replace advice given to you by your health care provider. Make sure you discuss any questions you have with your health care provider. ° °

## 2014-10-30 NOTE — Discharge Summary (Signed)
Physician Discharge Summary  Patient ID: Cynthia Peters MRN: [id number] DOB/AGE: 1950/06/23 64 y.o.  Admit date: 10/25/2014 Discharge date: 10/30/2014  Admission Diagnoses:s/p open VHR with mesh  Discharge Diagnoses:  Active Problems:   S/P repair of ventral hernia   Discharged Condition: good  Hospital Course: Pt was admitted post op to the floor.  She was slowly started on a CLD and adv to soft food.  She tolerated that well.  She was ambulating on POD1.  She con't to mobilize without any issues.  She had good pain control.  JP drain continued with SS drainage and she was DC'd with drain in place.    Consults: None  Significant Diagnostic Studies: none  Treatments: surgery: as above  Discharge Exam: Blood pressure 116/48, pulse 60, temperature 98.4 F (36.9 C), temperature source Oral, resp. rate 18, height 5\' 4"  (1.626 m), weight 74.39 kg (164 lb), SpO2 100 %. General appearance: alert and cooperative GI: soft, ttp, ND, +BS, wound c/d/i  Disposition: 01-Home or Self Care  Discharge Instructions    Diet - low sodium heart healthy    Complete by:  As directed      Increase activity slowly    Complete by:  As directed             Medication List    STOP taking these medications        ondansetron 8 MG tablet  Commonly known as:  ZOFRAN      TAKE these medications        acetaminophen 500 MG tablet  Commonly known as:  TYLENOL  Take 1,000 mg by mouth every 6 (six) hours as needed (pain).     albuterol 108 (90 BASE) MCG/ACT inhaler  Commonly known as:  PROVENTIL HFA;VENTOLIN HFA  Inhale 2 puffs into the lungs every 6 (six) hours as needed for wheezing or shortness of breath.     ALIGN PO  Take 1 capsule by mouth daily.     buPROPion 300 MG 24 hr tablet  Commonly known as:  WELLBUTRIN XL  Take 300 mg by mouth daily.     CALTRATE 600+D 600-400 MG-UNIT per tablet  Generic drug:  Calcium Carbonate-Vitamin D  Take 1 tablet by mouth daily.      clobetasol ointment 0.05 %  Commonly known as:  TEMOVATE  Apply topically as needed. APPLY TO AFFECTED AREA TWICE A DAY     Co Q 10 100 MG Caps  Take 100 mg by mouth daily.     cycloSPORINE 0.05 % ophthalmic emulsion  Commonly known as:  RESTASIS  Place 1 drop into both eyes 2 (two) times daily.     dexamethasone 0.5 MG/5ML solution  Commonly known as:  DECADRON  TAKE 10 ML BY MOUTH EVERY 6 HOURS AS A MOUTHRINSE     diazepam 5 MG tablet  Commonly known as:  VALIUM  Take 2.5-5 mg by mouth every 6 (six) hours as needed.     Evening Primrose Oil 1000 MG Caps  Take 2,000 mg by mouth daily.     Flaxseed (Linseed) 1000 MG Caps  Take 2,000 mg by mouth daily.     fluconazole 150 MG tablet  Commonly known as:  DIFLUCAN  Take 1 tablet (150 mg total) by mouth once. Take one tablet.  Repeat in 72 hours if symptoms are not completely resolved.     fluticasone 50 MCG/ACT nasal spray  Commonly known as:  FLONASE  Place 1 spray into both  nostrils 2 (two) times daily as needed for allergies or rhinitis.     hydrochlorothiazide 25 MG tablet  Commonly known as:  HYDRODIURIL  Take 25 mg by mouth daily.     ibuprofen 200 MG tablet  Commonly known as:  ADVIL,MOTRIN  Take 400-600 mg by mouth every 6 (six) hours as needed (pain).     ketorolac 10 MG tablet  Commonly known as:  TORADOL  Take 10 mg by mouth every 6 (six) hours as needed (pain).     KLOR-CON M20 20 MEQ tablet  Generic drug:  potassium chloride SA  TAKE 20 MEQ BY MOUTH DAILY. TAKE 20 MEQ AND 10 MEQ TO = 30 MEQ DAILY     KLOR-CON M10 10 MEQ tablet  Generic drug:  potassium chloride  TAKE 1 TABLET (10 MEQ TOTAL) BY MOUTH DAILY. TAKE WITH THE 20 MEQ TABLET DAILY TO = 30 MEQ DAILY     lidocaine 5 % ointment  Commonly known as:  XYLOCAINE  Apply topically 3 (three) times daily as needed.     loratadine 10 MG tablet  Commonly known as:  CLARITIN  Take 10 mg by mouth daily as needed for allergies.     losartan 50 MG tablet   Commonly known as:  COZAAR  Take 50 mg by mouth daily.     LOTEMAX 0.5 % ophthalmic suspension  Generic drug:  loteprednol  As needed     meclizine 25 MG tablet  Commonly known as:  ANTIVERT  Take 1 tablet (25 mg total) by mouth 3 (three) times daily as needed for dizziness.     metoprolol succinate 50 MG 24 hr tablet  Commonly known as:  TOPROL-XL  Take 2 tablets (100 mg total) by mouth daily. OK TAKE EXTRA 1/2 TAB FOR BREAKTHROUGH PALPITATIONS     montelukast 10 MG tablet  Commonly known as:  SINGULAIR  Take 10 mg by mouth daily as needed (seasonal allergies).     multivitamin with minerals Tabs tablet  Take 1 tablet by mouth daily.     nystatin 100000 UNIT/ML suspension  Commonly known as:  MYCOSTATIN  Take 5 mLs (500,000 Units total) by mouth 4 (four) times daily.     nystatin cream  Commonly known as:  MYCOSTATIN  Apply 1 application topically 2 (two) times daily. Apply to affected area BID for up to 7 days.     omeprazole 20 MG tablet  Commonly known as:  PRILOSEC OTC  Take 20 mg by mouth daily.     oxyCODONE-acetaminophen 5-325 MG per tablet  Commonly known as:  ROXICET  Take 1-2 tablets by mouth every 4 (four) hours as needed.     PAZEO 0.7 % Soln  Generic drug:  Olopatadine HCl  Place 1 drop into both eyes daily as needed (allergies).     phenazopyridine 200 MG tablet  Commonly known as:  PYRIDIUM  Take 1 tablet (200 mg total) by mouth 3 (three) times daily as needed for pain.     PREVIDENT 5000 DRY MOUTH 1.1 % Gel dental gel  Generic drug:  sodium fluoride  USE ONCE DAILY IN PLACE OF REGULAR TOOTHEPASTE     simvastatin 40 MG tablet  Commonly known as:  ZOCOR  Take 40 mg by mouth daily.     valACYclovir 500 MG tablet  Commonly known as:  VALTREX  Increase to bid x 3 day with outbreak     Vitamin D (Ergocalciferol) 50000 UNITS Caps capsule  Commonly known  as:  DRISDOL  Take 50,000 Units by mouth every 7 (seven) days. On Mondays     zolpidem 10  MG tablet  Commonly known as:  AMBIEN  TAKE 1/2 TABLET BY MOUTH AT BEDTIME AS NEEDED FOR SLEEP         Signed: Rosario Jacks., Ashyra Cantin 10/30/2014, 9:36 AM

## 2015-02-23 ENCOUNTER — Other Ambulatory Visit: Payer: Self-pay | Admitting: General Surgery

## 2015-02-23 DIAGNOSIS — K432 Incisional hernia without obstruction or gangrene: Secondary | ICD-10-CM

## 2015-03-01 ENCOUNTER — Ambulatory Visit
Admission: RE | Admit: 2015-03-01 | Discharge: 2015-03-01 | Disposition: A | Discharge status: Home / Self Care | Payer: BC Managed Care – PPO | Source: Ambulatory Visit | Attending: General Surgery | Admitting: General Surgery

## 2015-03-01 DIAGNOSIS — K432 Incisional hernia without obstruction or gangrene: Secondary | ICD-10-CM

## 2015-03-01 MED ORDER — IOPAMIDOL (ISOVUE-300) INJECTION 61%
100.0000 mL | Freq: Once | INTRAVENOUS | Status: AC | PRN
  Administered 2015-03-01: 100 mL via INTRAVENOUS

## 2015-03-16 ENCOUNTER — Ambulatory Visit: Payer: Self-pay | Admitting: General Surgery

## 2015-03-21 ENCOUNTER — Inpatient Hospital Stay (HOSPITAL_COMMUNITY)
Admission: RE | Admit: 2015-03-21 | Discharge: 2015-03-21 | Disposition: A | Discharge status: Home / Self Care | Payer: BC Managed Care – PPO | Source: Ambulatory Visit

## 2015-03-21 ENCOUNTER — Encounter (HOSPITAL_COMMUNITY): Payer: Self-pay

## 2015-03-21 ENCOUNTER — Encounter (HOSPITAL_COMMUNITY)
Admission: RE | Admit: 2015-03-21 | Discharge: 2015-03-21 | Disposition: A | Discharge status: Home / Self Care | Payer: BC Managed Care – PPO | Source: Ambulatory Visit | Attending: General Surgery | Admitting: General Surgery

## 2015-03-21 ENCOUNTER — Ambulatory Visit (HOSPITAL_COMMUNITY)
Admission: RE | Admit: 2015-03-21 | Discharge: 2015-03-21 | Disposition: A | Discharge status: Home / Self Care | Payer: BC Managed Care – PPO | Source: Ambulatory Visit | Attending: Anesthesiology | Admitting: Anesthesiology

## 2015-03-21 DIAGNOSIS — R062 Wheezing: Secondary | ICD-10-CM | POA: Diagnosis present

## 2015-03-21 DIAGNOSIS — Z87891 Personal history of nicotine dependence: Secondary | ICD-10-CM | POA: Diagnosis not present

## 2015-03-21 HISTORY — DX: Dizziness and giddiness: R42

## 2015-03-21 HISTORY — DX: Unspecified cataract: H26.9

## 2015-03-21 HISTORY — DX: Depression, unspecified: F32.A

## 2015-03-21 HISTORY — DX: Personal history of Methicillin resistant Staphylococcus aureus infection: Z86.14

## 2015-03-21 HISTORY — DX: Personal history of other infectious and parasitic diseases: Z86.19

## 2015-03-21 HISTORY — DX: Personal history of other venous thrombosis and embolism: Z86.718

## 2015-03-21 HISTORY — DX: Bronchitis, not specified as acute or chronic: J40

## 2015-03-21 HISTORY — DX: Insomnia, unspecified: G47.00

## 2015-03-21 HISTORY — DX: Major depressive disorder, single episode, unspecified: F32.9

## 2015-03-21 HISTORY — DX: Other seasonal allergic rhinitis: J30.2

## 2015-03-21 LAB — CBC
HCT: 42.1 % (ref 36.0–46.0)
Hemoglobin: 14.5 g/dL (ref 12.0–15.0)
MCH: 33.6 pg (ref 26.0–34.0)
MCHC: 34.4 g/dL (ref 30.0–36.0)
MCV: 97.5 fL (ref 78.0–100.0)
Platelets: 233 10*3/uL (ref 150–400)
RBC: 4.32 MIL/uL (ref 3.87–5.11)
RDW: 12.4 % (ref 11.5–15.5)
WBC: 9.6 10*3/uL (ref 4.0–10.5)

## 2015-03-21 LAB — BASIC METABOLIC PANEL
Anion gap: 10 (ref 5–15)
BUN: 21 mg/dL — ABNORMAL HIGH (ref 6–20)
CO2: 28 mmol/L (ref 22–32)
Calcium: 9.7 mg/dL (ref 8.9–10.3)
Chloride: 104 mmol/L (ref 101–111)
Creatinine, Ser: 1.14 mg/dL — ABNORMAL HIGH (ref 0.44–1.00)
GFR calc Af Amer: 58 mL/min — ABNORMAL LOW (ref >60)
GFR calc non Af Amer: 50 mL/min — ABNORMAL LOW (ref >60)
Glucose, Bld: 110 mg/dL — ABNORMAL HIGH (ref 65–99)
Potassium: 4.3 mmol/L (ref 3.5–5.1)
Sodium: 142 mmol/L (ref 135–145)

## 2015-03-21 MED ORDER — CHLORHEXIDINE GLUCONATE 4 % EX LIQD: 1.0000 "application " | Freq: Once | CUTANEOUS | Status: DC

## 2015-03-21 NOTE — Progress Notes (Addendum)
Cardiologist is Dr.Peter Buddy Duty saw PA Richardson Dopp 03/2013  [facility] in [place]-Sees PA Karin Lieu  Echo report in epic from 2013  Stress test report in epic from 2010  Heart cath denies  EKG in epic from 10-25-14  CXR denies having in past yr

## 2015-03-21 NOTE — Pre-Procedure Instructions (Signed)
Brezlyn A Campione  03/21/2015      [id number] - [place], [place] - [address] [place] [address] Phone: [phone] Fax: [phone]    Your procedure is scheduled on Mon, Feb 20 @ 7:30 AM  Report to [facility] Admitting at 5:30 AM  Call this number if you have problems the morning of surgery:  [phone]   Remember:  Do not eat food or drink liquids after midnight.  Take these medicines the morning of surgery with A SIP OF WATER Albuterol<Bring Your Inhaler With You>,Bupropion(Wellbutrin),Eye Drops,Valium(Diazepam),Bentyl(Dicyclomine),Flonase(Fluticasone),Gabapentin(Neurontin),Claritin(Loratadine-if needed),Meclizine(Antivert-if needed),Metoprolol(Toprol),Singulair(Montelukast-if needed),Omeprazole(Prilosec),and Pyridium(Phenazophyridine)               Stop taking any Vitamins or Herbal Medications. No Goody's,BC's,Aleve,Advil,Motrin,Ibuprofen,or Fish Oil.    Do not wear jewelry, make-up or nail polish.  Do not wear lotions, powders, or perfumes.  You may wear deodorant.  Do not shave 48 hours prior to surgery.   Do not bring valuables to the hospital.  [facility] is not responsible for any belongings or valuables.  Contacts, dentures or bridgework may not be worn into surgery.  Leave your suitcase in the car.  After surgery it may be brought to your room.  For patients admitted to the hospital, discharge time will be determined by your treatment team.  Patients discharged the day of surgery will not be allowed to drive home.    Special instructions:  [place] - Preparing for Surgery  Before surgery, you can play an important role.  Because skin is not sterile, your skin needs to be as free of germs as possible.  You can reduce the number of germs on you skin by washing with CHG (chlorahexidine gluconate) soap before surgery.  CHG is an antiseptic cleaner which kills germs and bonds  with the skin to continue killing germs even after washing.  Please DO NOT use if you have an allergy to CHG or antibacterial soaps.  If your skin becomes reddened/irritated stop using the CHG and inform your nurse when you arrive at Short Stay.  Do not shave (including legs and underarms) for at least 48 hours prior to the first CHG shower.  You may shave your face.  Please follow these instructions carefully:   1.  Shower with CHG Soap the night before surgery and the                                morning of Surgery.  2.  If you choose to wash your hair, wash your hair first as usual with your       normal shampoo.  3.  After you shampoo, rinse your hair and body thoroughly to remove the                      Shampoo.  4.  Use CHG as you would any other liquid soap.  You can apply chg directly       to the skin and wash gently with scrungie or a clean washcloth.  5.  Apply the CHG Soap to your body ONLY FROM THE NECK DOWN.        Do not use on open wounds or open sores.  Avoid contact with your eyes,       ears, mouth and genitals (private parts).  Wash genitals (private parts)  with your normal soap.  6.  Wash thoroughly, paying special attention to the area where your surgery        will be performed.  7.  Thoroughly rinse your body with warm water from the neck down.  8.  DO NOT shower/wash with your normal soap after using and rinsing off       the CHG Soap.  9.  Pat yourself dry with a clean towel.            10.  Wear clean pajamas.            11.  Place clean sheets on your bed the night of your first shower and do not        sleep with pets.  Day of Surgery  Do not apply any lotions/deoderants the morning of surgery.  Please wear clean clothes to the hospital/surgery center.    Please read over the following fact sheets that you were given. Pain Booklet, Coughing and Deep Breathing and Surgical Site Infection Prevention

## 2015-03-22 ENCOUNTER — Other Ambulatory Visit (HOSPITAL_COMMUNITY): Payer: BC Managed Care – PPO

## 2015-03-25 MED ORDER — CEFAZOLIN SODIUM-DEXTROSE 2-3 GM-% IV SOLR
2.0000 g | INTRAVENOUS | Status: DC
  Filled 2015-03-25: qty 50

## 2015-03-25 NOTE — Anesthesia Preprocedure Evaluation (Addendum)
Anesthesia Evaluation  Patient identified by MRN, date of birth, ID band Patient awake    Reviewed: Allergy & Precautions, NPO status , Patient's Chart, lab work & pertinent test results, reviewed documented beta blocker date and time   Airway Mallampati: I  TM Distance: >3 FB Neck ROM: Full    Dental  (+) Teeth Intact, Caps, Dental Advisory Given   Pulmonary asthma , former smoker,    breath sounds clear to auscultation       Cardiovascular hypertension, Pt. on medications and Pt. on home beta blockers  Rhythm:Regular Rate:Normal     Neuro/Psych PSYCHIATRIC DISORDERS Anxiety Depression TIA   GI/Hepatic Neg liver ROS, hiatal hernia, GERD  Medicated,  Endo/Other  negative endocrine ROS  Renal/GU negative Renal ROS  negative genitourinary   Musculoskeletal  (+) Arthritis , Osteoarthritis,    Abdominal   Peds negative pediatric ROS (+)  Hematology negative hematology ROS (+)   Anesthesia Other Findings Crowns across top front  Reproductive/Obstetrics negative OB ROS                           Lab Results  Component Value Date   WBC 9.6 03/21/2015   HGB 14.5 03/21/2015   HCT 42.1 03/21/2015   MCV 97.5 03/21/2015   PLT 233 03/21/2015   Lab Results  Component Value Date   CREATININE 1.14* 03/21/2015   BUN 21* 03/21/2015   NA 142 03/21/2015   K 4.3 03/21/2015   CL 104 03/21/2015   CO2 28 03/21/2015   No results found for: INR, PROTIME  10/2014 EKG: NSR  Anesthesia Physical Anesthesia Plan  ASA: III  Anesthesia Plan: General   Post-op Pain Management:    Induction: Intravenous  Airway Management Planned: Oral ETT  Additional Equipment:   Intra-op Plan:   Post-operative Plan: Extubation in OR  Informed Consent: I have reviewed the patients History and Physical, chart, labs and discussed the procedure including the risks, benefits and alternatives for the proposed  anesthesia with the patient or authorized representative who has indicated his/her understanding and acceptance.   Dental advisory given  Plan Discussed with: CRNA  Anesthesia Plan Comments:         Anesthesia Quick Evaluation

## 2015-03-25 NOTE — Progress Notes (Signed)
Pt called to ask about taking simvastatin, klor-con aambien and valtrex.  Instructed her that it's okay to take those today but not to take them in the morning.  Pt voice understanding.

## 2015-03-25 NOTE — Progress Notes (Signed)
Pt called to ask about vitamin D.  Instructed her to hold all vitamins before surgery.  She voice understanding.

## 2015-03-26 ENCOUNTER — Ambulatory Visit (HOSPITAL_COMMUNITY): Payer: BC Managed Care – PPO | Admitting: Anesthesiology

## 2015-03-26 ENCOUNTER — Observation Stay (HOSPITAL_COMMUNITY)
Admission: RE | Admit: 2015-03-26 | Discharge: 2015-03-30 | Disposition: A | Discharge status: Home / Self Care | Payer: BC Managed Care – PPO | Source: Ambulatory Visit | Attending: General Surgery | Admitting: General Surgery

## 2015-03-26 ENCOUNTER — Encounter (HOSPITAL_COMMUNITY)
Admission: RE | Disposition: A | Discharge status: Home / Self Care | Payer: Self-pay | Source: Ambulatory Visit | Attending: General Surgery

## 2015-03-26 ENCOUNTER — Encounter (HOSPITAL_COMMUNITY): Payer: Self-pay | Admitting: *Deleted

## 2015-03-26 DIAGNOSIS — F329 Major depressive disorder, single episode, unspecified: Secondary | ICD-10-CM | POA: Diagnosis not present

## 2015-03-26 DIAGNOSIS — K219 Gastro-esophageal reflux disease without esophagitis: Secondary | ICD-10-CM | POA: Insufficient documentation

## 2015-03-26 DIAGNOSIS — F419 Anxiety disorder, unspecified: Secondary | ICD-10-CM | POA: Insufficient documentation

## 2015-03-26 DIAGNOSIS — K432 Incisional hernia without obstruction or gangrene: Secondary | ICD-10-CM | POA: Diagnosis not present

## 2015-03-26 DIAGNOSIS — Z9889 Other specified postprocedural states: Secondary | ICD-10-CM

## 2015-03-26 DIAGNOSIS — J45909 Unspecified asthma, uncomplicated: Secondary | ICD-10-CM | POA: Diagnosis not present

## 2015-03-26 DIAGNOSIS — K660 Peritoneal adhesions (postprocedural) (postinfection): Secondary | ICD-10-CM | POA: Diagnosis not present

## 2015-03-26 DIAGNOSIS — Z79899 Other long term (current) drug therapy: Secondary | ICD-10-CM | POA: Insufficient documentation

## 2015-03-26 DIAGNOSIS — M1990 Unspecified osteoarthritis, unspecified site: Secondary | ICD-10-CM | POA: Diagnosis not present

## 2015-03-26 DIAGNOSIS — Z8719 Personal history of other diseases of the digestive system: Secondary | ICD-10-CM

## 2015-03-26 DIAGNOSIS — K589 Irritable bowel syndrome without diarrhea: Secondary | ICD-10-CM | POA: Insufficient documentation

## 2015-03-26 HISTORY — PX: INCISIONAL HERNIA REPAIR: SHX193

## 2015-03-26 HISTORY — PX: LAPAROSCOPIC LYSIS OF ADHESIONS: SHX5905

## 2015-03-26 SURGERY — LYSIS, ADHESIONS, LAPAROSCOPIC
Anesthesia: General | Site: Abdomen

## 2015-03-26 MED ORDER — CO Q 10 100 MG PO CAPS: 100.0000 mg | ORAL_CAPSULE | Freq: Every day | ORAL | Status: DC

## 2015-03-26 MED ORDER — OXYCODONE HCL 5 MG PO TABS: 5.0000 mg | ORAL_TABLET | ORAL | Status: DC | PRN

## 2015-03-26 MED ORDER — SODIUM CHLORIDE 0.9% FLUSH: 3.0000 mL | INTRAVENOUS | Status: DC | PRN

## 2015-03-26 MED ORDER — ONDANSETRON HCL 4 MG/2ML IJ SOLN: 4.0000 mg | Freq: Four times a day (QID) | INTRAMUSCULAR | Status: DC | PRN

## 2015-03-26 MED ORDER — BUPIVACAINE HCL (PF) 0.25 % IJ SOLN
INTRAMUSCULAR | Status: AC
  Filled 2015-03-26: qty 30

## 2015-03-26 MED ORDER — DICYCLOMINE HCL 10 MG PO CAPS: 10.0000 mg | ORAL_CAPSULE | ORAL | Status: DC | PRN

## 2015-03-26 MED ORDER — HYDROMORPHONE HCL 1 MG/ML IJ SOLN: 1.0000 mg | INTRAMUSCULAR | Status: DC | PRN

## 2015-03-26 MED ORDER — HYDROCHLOROTHIAZIDE 25 MG PO TABS
25.0000 mg | ORAL_TABLET | Freq: Every day | ORAL | Status: DC
  Administered 2015-03-26 – 2015-03-30 (×5): 25 mg via ORAL
  Filled 2015-03-26 (×5): qty 1

## 2015-03-26 MED ORDER — DIAZEPAM 2 MG PO TABS: 2.0000 mg | ORAL_TABLET | Freq: Four times a day (QID) | ORAL | Status: DC | PRN

## 2015-03-26 MED ORDER — ACETAMINOPHEN 10 MG/ML IV SOLN
INTRAVENOUS | Status: AC
  Filled 2015-03-26: qty 100

## 2015-03-26 MED ORDER — SIMVASTATIN 40 MG PO TABS
40.0000 mg | ORAL_TABLET | Freq: Every day | ORAL | Status: DC
  Administered 2015-03-27 – 2015-03-30 (×4): 40 mg via ORAL
  Filled 2015-03-26 (×6): qty 1

## 2015-03-26 MED ORDER — LOTEPREDNOL ETABONATE 0.5 % OP SUSP
1.0000 [drp] | Freq: Four times a day (QID) | OPHTHALMIC | Status: DC
  Administered 2015-03-26 – 2015-03-30 (×10): 1 [drp] via OPHTHALMIC
  Filled 2015-03-26: qty 5

## 2015-03-26 MED ORDER — ALBUTEROL SULFATE HFA 108 (90 BASE) MCG/ACT IN AERS: 2.0000 | INHALATION_SPRAY | Freq: Four times a day (QID) | RESPIRATORY_TRACT | Status: DC | PRN

## 2015-03-26 MED ORDER — ONDANSETRON HCL 4 MG/2ML IJ SOLN
INTRAMUSCULAR | Status: DC | PRN
  Administered 2015-03-26: 4 mg via INTRAVENOUS

## 2015-03-26 MED ORDER — PROMETHAZINE HCL 25 MG/ML IJ SOLN: 6.2500 mg | INTRAMUSCULAR | Status: DC | PRN

## 2015-03-26 MED ORDER — EVICEL 5 ML EX KIT
PACK | CUTANEOUS | Status: DC | PRN
  Administered 2015-03-26: 1

## 2015-03-26 MED ORDER — SODIUM CHLORIDE 0.9 % IR SOLN
Status: DC | PRN
  Administered 2015-03-26: 1000 mL

## 2015-03-26 MED ORDER — 0.9 % SODIUM CHLORIDE (POUR BTL) OPTIME
TOPICAL | Status: DC | PRN
  Administered 2015-03-26: 1000 mL

## 2015-03-26 MED ORDER — MIDAZOLAM HCL 5 MG/5ML IJ SOLN
INTRAMUSCULAR | Status: DC | PRN
  Administered 2015-03-26 (×2): 1 mg via INTRAVENOUS

## 2015-03-26 MED ORDER — OXYCODONE HCL 5 MG PO TABS
5.0000 mg | ORAL_TABLET | ORAL | Status: DC | PRN
Start: 2015-03-26 — End: 2015-03-30
  Administered 2015-03-26 – 2015-03-27 (×4): 10 mg via ORAL
  Administered 2015-03-27: 5 mg via ORAL
  Administered 2015-03-27 – 2015-03-28 (×2): 10 mg via ORAL
  Administered 2015-03-28: 5 mg via ORAL
  Administered 2015-03-28 (×2): 10 mg via ORAL
  Administered 2015-03-29: 5 mg via ORAL
  Administered 2015-03-29: 10 mg via ORAL
  Administered 2015-03-29 – 2015-03-30 (×3): 5 mg via ORAL
  Filled 2015-03-26 (×6): qty 2
  Filled 2015-03-26: qty 1
  Filled 2015-03-26: qty 2
  Filled 2015-03-26 (×2): qty 1
  Filled 2015-03-26 (×3): qty 2
  Filled 2015-03-26 (×2): qty 1

## 2015-03-26 MED ORDER — BUPIVACAINE HCL 0.25 % IJ SOLN
INTRAMUSCULAR | Status: DC | PRN
  Administered 2015-03-26: 11 mL

## 2015-03-26 MED ORDER — GABAPENTIN 300 MG PO CAPS
300.0000 mg | ORAL_CAPSULE | Freq: Three times a day (TID) | ORAL | Status: DC
  Administered 2015-03-26 – 2015-03-30 (×12): 300 mg via ORAL
  Filled 2015-03-26 (×12): qty 1

## 2015-03-26 MED ORDER — BUPROPION HCL ER (XL) 150 MG PO TB24
300.0000 mg | ORAL_TABLET | Freq: Every day | ORAL | Status: DC
  Administered 2015-03-27 – 2015-03-30 (×4): 300 mg via ORAL
  Filled 2015-03-26 (×5): qty 2

## 2015-03-26 MED ORDER — ACETAMINOPHEN-CODEINE #3 300-30 MG PO TABS: 1.0000 | ORAL_TABLET | Freq: Four times a day (QID) | ORAL | Status: DC | PRN

## 2015-03-26 MED ORDER — MORPHINE SULFATE (PF) 2 MG/ML IV SOLN: 2.0000 mg | INTRAVENOUS | Status: DC | PRN

## 2015-03-26 MED ORDER — ACETAMINOPHEN 650 MG RE SUPP: 650.0000 mg | RECTAL | Status: DC | PRN

## 2015-03-26 MED ORDER — ONDANSETRON 4 MG PO TBDP: 4.0000 mg | ORAL_TABLET | Freq: Four times a day (QID) | ORAL | Status: DC | PRN

## 2015-03-26 MED ORDER — HYDROMORPHONE HCL 1 MG/ML IJ SOLN
INTRAMUSCULAR | Status: AC
  Filled 2015-03-26: qty 1

## 2015-03-26 MED ORDER — ACETAMINOPHEN 325 MG PO TABS
650.0000 mg | ORAL_TABLET | ORAL | Status: DC | PRN
  Administered 2015-03-27 – 2015-03-30 (×8): 650 mg via ORAL
  Filled 2015-03-26 (×8): qty 2

## 2015-03-26 MED ORDER — ADULT MULTIVITAMIN W/MINERALS CH
1.0000 | ORAL_TABLET | Freq: Every day | ORAL | Status: DC
  Administered 2015-03-26 – 2015-03-30 (×5): 1 via ORAL
  Filled 2015-03-26 (×5): qty 1

## 2015-03-26 MED ORDER — FLAXSEED (LINSEED) 1000 MG PO CAPS: 2000.0000 mg | ORAL_CAPSULE | Freq: Every day | ORAL | Status: DC

## 2015-03-26 MED ORDER — EVICEL 5 ML EX KIT
PACK | CUTANEOUS | Status: AC
  Filled 2015-03-26: qty 1

## 2015-03-26 MED ORDER — MECLIZINE HCL 25 MG PO TABS
25.0000 mg | ORAL_TABLET | Freq: Three times a day (TID) | ORAL | Status: DC | PRN
  Filled 2015-03-26: qty 1

## 2015-03-26 MED ORDER — EVENING PRIMROSE OIL 1000 MG PO CAPS: 2000.0000 mg | ORAL_CAPSULE | Freq: Every day | ORAL | Status: DC

## 2015-03-26 MED ORDER — SODIUM CHLORIDE 0.9 % IV SOLN
INTRAVENOUS | Status: DC | PRN
  Administered 2015-03-26: 40 mL

## 2015-03-26 MED ORDER — IBUPROFEN 400 MG PO TABS: 400.0000 mg | ORAL_TABLET | Freq: Two times a day (BID) | ORAL | Status: DC | PRN

## 2015-03-26 MED ORDER — MEPERIDINE HCL 25 MG/ML IJ SOLN: 6.2500 mg | INTRAMUSCULAR | Status: DC | PRN

## 2015-03-26 MED ORDER — HYDROMORPHONE HCL 1 MG/ML IJ SOLN
0.2500 mg | INTRAMUSCULAR | Status: DC | PRN
  Administered 2015-03-26 (×4): 0.5 mg via INTRAVENOUS

## 2015-03-26 MED ORDER — VITAMIN D 50 MCG (2000 UT) PO CAPS: 2000.0000 [IU] | ORAL_CAPSULE | Freq: Every day | ORAL | Status: DC

## 2015-03-26 MED ORDER — ZOLPIDEM TARTRATE 5 MG PO TABS: 10.0000 mg | ORAL_TABLET | Freq: Every evening | ORAL | Status: DC | PRN

## 2015-03-26 MED ORDER — NEOSTIGMINE METHYLSULFATE 10 MG/10ML IV SOLN
INTRAVENOUS | Status: AC
  Filled 2015-03-26: qty 1

## 2015-03-26 MED ORDER — LORATADINE 10 MG PO TABS: 10.0000 mg | ORAL_TABLET | Freq: Every day | ORAL | Status: DC | PRN

## 2015-03-26 MED ORDER — SODIUM CHLORIDE 0.9% FLUSH
3.0000 mL | Freq: Two times a day (BID) | INTRAVENOUS | Status: DC
  Administered 2015-03-28 (×2): 3 mL via INTRAVENOUS

## 2015-03-26 MED ORDER — PROPOFOL 10 MG/ML IV BOLUS
INTRAVENOUS | Status: DC | PRN
  Administered 2015-03-26: 200 mg via INTRAVENOUS

## 2015-03-26 MED ORDER — LACTATED RINGERS IV SOLN
INTRAVENOUS | Status: DC | PRN
  Administered 2015-03-26 (×2): via INTRAVENOUS

## 2015-03-26 MED ORDER — CEFAZOLIN SODIUM-DEXTROSE 2-3 GM-% IV SOLR
INTRAVENOUS | Status: DC | PRN
  Administered 2015-03-26: 2 g via INTRAVENOUS

## 2015-03-26 MED ORDER — NEOSTIGMINE METHYLSULFATE 10 MG/10ML IV SOLN
INTRAVENOUS | Status: DC | PRN
  Administered 2015-03-26: 4 mg via INTRAVENOUS

## 2015-03-26 MED ORDER — PROPOFOL 10 MG/ML IV BOLUS
INTRAVENOUS | Status: AC
  Filled 2015-03-26: qty 40

## 2015-03-26 MED ORDER — CALCIUM CARBONATE-VITAMIN D 600-400 MG-UNIT PO TABS: 1.0000 | ORAL_TABLET | Freq: Every day | ORAL | Status: DC

## 2015-03-26 MED ORDER — METOPROLOL SUCCINATE ER 25 MG PO TB24: 25.0000 mg | ORAL_TABLET | ORAL | Status: DC

## 2015-03-26 MED ORDER — LACTATED RINGERS IV SOLN: INTRAVENOUS | Status: DC

## 2015-03-26 MED ORDER — ENOXAPARIN SODIUM 40 MG/0.4ML ~~LOC~~ SOLN
40.0000 mg | SUBCUTANEOUS | Status: DC
  Administered 2015-03-26 – 2015-03-29 (×4): 40 mg via SUBCUTANEOUS
  Filled 2015-03-26 (×5): qty 0.4

## 2015-03-26 MED ORDER — HYDRALAZINE HCL 20 MG/ML IJ SOLN: 10.0000 mg | INTRAMUSCULAR | Status: DC | PRN

## 2015-03-26 MED ORDER — PANTOPRAZOLE SODIUM 20 MG PO TBEC
20.0000 mg | DELAYED_RELEASE_TABLET | Freq: Every day | ORAL | Status: DC
  Administered 2015-03-26 – 2015-03-30 (×4): 20 mg via ORAL
  Filled 2015-03-26 (×5): qty 1

## 2015-03-26 MED ORDER — BUPIVACAINE LIPOSOME 1.3 % IJ SUSP
20.0000 mL | INTRAMUSCULAR | Status: DC
  Filled 2015-03-26: qty 20

## 2015-03-26 MED ORDER — OMEPRAZOLE MAGNESIUM 20 MG PO TBEC: 20.0000 mg | DELAYED_RELEASE_TABLET | Freq: Every day | ORAL | Status: DC

## 2015-03-26 MED ORDER — GLYCOPYRROLATE 0.2 MG/ML IJ SOLN
INTRAMUSCULAR | Status: AC
  Filled 2015-03-26: qty 3

## 2015-03-26 MED ORDER — MONTELUKAST SODIUM 10 MG PO TABS: 10.0000 mg | ORAL_TABLET | Freq: Every day | ORAL | Status: DC | PRN

## 2015-03-26 MED ORDER — SODIUM CHLORIDE 0.9 % IV SOLN: 250.0000 mL | INTRAVENOUS | Status: DC | PRN

## 2015-03-26 MED ORDER — ROCURONIUM BROMIDE 50 MG/5ML IV SOLN
INTRAVENOUS | Status: AC
  Filled 2015-03-26: qty 1

## 2015-03-26 MED ORDER — ONDANSETRON HCL 4 MG/2ML IJ SOLN
INTRAMUSCULAR | Status: AC
  Filled 2015-03-26: qty 2

## 2015-03-26 MED ORDER — ROCURONIUM BROMIDE 100 MG/10ML IV SOLN
INTRAVENOUS | Status: DC | PRN
  Administered 2015-03-26: 10 mg via INTRAVENOUS
  Administered 2015-03-26: 40 mg via INTRAVENOUS

## 2015-03-26 MED ORDER — LOSARTAN POTASSIUM 50 MG PO TABS
50.0000 mg | ORAL_TABLET | Freq: Every day | ORAL | Status: DC
  Administered 2015-03-26 – 2015-03-30 (×5): 50 mg via ORAL
  Filled 2015-03-26 (×6): qty 1

## 2015-03-26 MED ORDER — PHENYLEPHRINE 40 MCG/ML (10ML) SYRINGE FOR IV PUSH (FOR BLOOD PRESSURE SUPPORT)
PREFILLED_SYRINGE | INTRAVENOUS | Status: AC
  Filled 2015-03-26: qty 10

## 2015-03-26 MED ORDER — ACETAMINOPHEN 10 MG/ML IV SOLN
INTRAVENOUS | Status: DC | PRN
  Administered 2015-03-26: 1000 mg via INTRAVENOUS

## 2015-03-26 MED ORDER — ALBUTEROL SULFATE (2.5 MG/3ML) 0.083% IN NEBU: 2.5000 mg | INHALATION_SOLUTION | Freq: Four times a day (QID) | RESPIRATORY_TRACT | Status: DC | PRN

## 2015-03-26 MED ORDER — PHENAZOPYRIDINE HCL 200 MG PO TABS
200.0000 mg | ORAL_TABLET | Freq: Three times a day (TID) | ORAL | Status: DC | PRN
  Filled 2015-03-26: qty 1

## 2015-03-26 MED ORDER — FENTANYL CITRATE (PF) 250 MCG/5ML IJ SOLN
INTRAMUSCULAR | Status: AC
Start: 2015-03-26 — End: 2015-03-26
  Filled 2015-03-26: qty 5

## 2015-03-26 MED ORDER — CALCIUM CARBONATE-VITAMIN D 500-200 MG-UNIT PO TABS
1.0000 | ORAL_TABLET | Freq: Every day | ORAL | Status: DC
Start: 2015-03-27 — End: 2015-03-30
  Administered 2015-03-27 – 2015-03-30 (×4): 1 via ORAL
  Filled 2015-03-26 (×4): qty 1

## 2015-03-26 MED ORDER — METOPROLOL SUCCINATE ER 100 MG PO TB24
100.0000 mg | ORAL_TABLET | Freq: Every day | ORAL | Status: DC
  Administered 2015-03-27 – 2015-03-30 (×4): 100 mg via ORAL
  Filled 2015-03-26 (×4): qty 1

## 2015-03-26 MED ORDER — GLYCOPYRROLATE 0.2 MG/ML IJ SOLN
INTRAMUSCULAR | Status: DC | PRN
  Administered 2015-03-26: 0.6 mg via INTRAVENOUS

## 2015-03-26 MED ORDER — DEXTROSE-NACL 5-0.9 % IV SOLN
INTRAVENOUS | Status: DC
  Administered 2015-03-26 – 2015-03-27 (×2): via INTRAVENOUS

## 2015-03-26 MED ORDER — ACETAMINOPHEN 500 MG PO TABS: 1000.0000 mg | ORAL_TABLET | Freq: Four times a day (QID) | ORAL | Status: DC | PRN

## 2015-03-26 MED ORDER — CYCLOSPORINE 0.05 % OP EMUL
1.0000 [drp] | Freq: Two times a day (BID) | OPHTHALMIC | Status: DC
  Administered 2015-03-26 – 2015-03-30 (×8): 1 [drp] via OPHTHALMIC
  Filled 2015-03-26 (×8): qty 1

## 2015-03-26 MED ORDER — FENTANYL CITRATE (PF) 100 MCG/2ML IJ SOLN
INTRAMUSCULAR | Status: DC | PRN
  Administered 2015-03-26 (×3): 50 ug via INTRAVENOUS

## 2015-03-26 MED ORDER — ACETAMINOPHEN 10 MG/ML IV SOLN
1000.0000 mg | INTRAVENOUS | Status: DC
  Filled 2015-03-26: qty 100

## 2015-03-26 MED ORDER — ZOLPIDEM TARTRATE 5 MG PO TABS
5.0000 mg | ORAL_TABLET | Freq: Every evening | ORAL | Status: DC | PRN
  Administered 2015-03-27 – 2015-03-29 (×3): 5 mg via ORAL
  Filled 2015-03-26 (×4): qty 1

## 2015-03-26 MED ORDER — FLUTICASONE PROPIONATE 50 MCG/ACT NA SUSP
1.0000 | Freq: Two times a day (BID) | NASAL | Status: DC | PRN
  Filled 2015-03-26: qty 16

## 2015-03-26 MED ORDER — MIDAZOLAM HCL 2 MG/2ML IJ SOLN
INTRAMUSCULAR | Status: AC
  Filled 2015-03-26: qty 2

## 2015-03-26 MED ORDER — PHENYLEPHRINE HCL 10 MG/ML IJ SOLN
INTRAMUSCULAR | Status: DC | PRN
  Administered 2015-03-26 (×2): 80 ug via INTRAVENOUS
  Administered 2015-03-26: 120 ug via INTRAVENOUS
  Administered 2015-03-26: 80 ug via INTRAVENOUS
  Administered 2015-03-26: 40 ug via INTRAVENOUS
  Administered 2015-03-26 (×4): 80 ug via INTRAVENOUS

## 2015-03-26 SURGICAL SUPPLY — 59 items
APPLIER CLIP 5 13 M/L LIGAMAX5 (MISCELLANEOUS)
APPLIER CLIP LOGIC TI 5 (MISCELLANEOUS) IMPLANT
BENZOIN TINCTURE PRP APPL 2/3 (GAUZE/BANDAGES/DRESSINGS) ×2 IMPLANT
BINDER ABD UNIV 12 45-62 (WOUND CARE) ×1 IMPLANT
BINDER ABDOMINAL 46IN 62IN (WOUND CARE) ×2
BLADE SURG ROTATE 9660 (MISCELLANEOUS) IMPLANT
CANISTER SUCTION 2500CC (MISCELLANEOUS) IMPLANT
CHLORAPREP W/TINT 26ML (MISCELLANEOUS) ×2 IMPLANT
CLIP APPLIE 5 13 M/L LIGAMAX5 (MISCELLANEOUS) IMPLANT
CLSR STERI-STRIP ANTIMIC 1/2X4 (GAUZE/BANDAGES/DRESSINGS) ×2 IMPLANT
COVER SURGICAL LIGHT HANDLE (MISCELLANEOUS) ×2 IMPLANT
DEVICE RELIATACK FIXATION (MISCELLANEOUS) ×4 IMPLANT
DEVICE SECURE STRAP 25 ABSORB (INSTRUMENTS) IMPLANT
DEVICE TROCAR PUNCTURE CLOSURE (ENDOMECHANICALS) ×2 IMPLANT
DRAPE LAPAROSCOPIC ABDOMINAL (DRAPES) ×2 IMPLANT
DRAPE UTILITY XL STRL (DRAPES) ×4 IMPLANT
ELECT REM PT RETURN 9FT ADLT (ELECTROSURGICAL) ×2
ELECTRODE REM PT RTRN 9FT ADLT (ELECTROSURGICAL) ×1 IMPLANT
GAUZE SPONGE 2X2 8PLY STRL LF (GAUZE/BANDAGES/DRESSINGS) ×1 IMPLANT
GLOVE BIO SURGEON STRL SZ7.5 (GLOVE) ×2 IMPLANT
GLOVE BIOGEL PI IND STRL 6.5 (GLOVE) ×1 IMPLANT
GLOVE BIOGEL PI IND STRL 7.5 (GLOVE) ×1 IMPLANT
GLOVE BIOGEL PI INDICATOR 6.5 (GLOVE) ×1
GLOVE BIOGEL PI INDICATOR 7.5 (GLOVE) ×1
GLOVE ECLIPSE 7.5 STRL STRAW (GLOVE) ×2 IMPLANT
GOWN STRL REUS W/ TWL LRG LVL3 (GOWN DISPOSABLE) ×2 IMPLANT
GOWN STRL REUS W/ TWL XL LVL3 (GOWN DISPOSABLE) ×1 IMPLANT
GOWN STRL REUS W/TWL LRG LVL3 (GOWN DISPOSABLE) ×2
GOWN STRL REUS W/TWL XL LVL3 (GOWN DISPOSABLE) ×1
KIT BASIN OR (CUSTOM PROCEDURE TRAY) ×2 IMPLANT
KIT ROOM TURNOVER OR (KITS) ×2 IMPLANT
MARKER SKIN DUAL TIP RULER LAB (MISCELLANEOUS) ×2 IMPLANT
MESH VENTRALIGHT ST 6X8 (Mesh Specialty) ×1 IMPLANT
MESH VENTRLGHT ELLIPSE 8X6XMFL (Mesh Specialty) ×1 IMPLANT
NEEDLE 22X1 1/2 (OR ONLY) (NEEDLE) ×2 IMPLANT
NEEDLE INSUFFLATION 14GA 120MM (NEEDLE) ×2 IMPLANT
NEEDLE SPNL 22GX3.5 QUINCKE BK (NEEDLE) IMPLANT
NS IRRIG 1000ML POUR BTL (IV SOLUTION) ×2 IMPLANT
PAD ARMBOARD 7.5X6 YLW CONV (MISCELLANEOUS) ×4 IMPLANT
RELOAD RELIATACK 10 (MISCELLANEOUS) IMPLANT
RELOAD RELIATACK 5 (MISCELLANEOUS) IMPLANT
SCALPEL HARMONIC ACE (MISCELLANEOUS) IMPLANT
SCISSORS LAP 5X35 DISP (ENDOMECHANICALS) ×2 IMPLANT
SET IRRIG TUBING LAPAROSCOPIC (IRRIGATION / IRRIGATOR) IMPLANT
SLEEVE ENDOPATH XCEL 5M (ENDOMECHANICALS) ×6 IMPLANT
SPONGE GAUZE 2X2 STER 10/PKG (GAUZE/BANDAGES/DRESSINGS) ×1
SUT CHROMIC 2 0 SH (SUTURE) ×2 IMPLANT
SUT MNCRL AB 3-0 PS2 18 (SUTURE) ×2 IMPLANT
SUT NOVA NAB DX-16 0-1 5-0 T12 (SUTURE) IMPLANT
SUT NOVA NAB GS-21 0 18 T12 DT (SUTURE) ×4 IMPLANT
SUT PROLENE 2 0 KS (SUTURE) ×4 IMPLANT
TIP RIGID 35CM EVICEL (HEMOSTASIS) ×2 IMPLANT
TOWEL OR 17X24 6PK STRL BLUE (TOWEL DISPOSABLE) ×2 IMPLANT
TOWEL OR 17X26 10 PK STRL BLUE (TOWEL DISPOSABLE) ×2 IMPLANT
TRAY FOLEY CATH 16FR SILVER (SET/KITS/TRAYS/PACK) IMPLANT
TRAY LAPAROSCOPIC MC (CUSTOM PROCEDURE TRAY) ×2 IMPLANT
TROCAR XCEL NON-BLD 11X100MML (ENDOMECHANICALS) IMPLANT
TROCAR XCEL NON-BLD 5MMX100MML (ENDOMECHANICALS) ×2 IMPLANT
TUBING INSUFFLATION (TUBING) ×2 IMPLANT

## 2015-03-26 NOTE — Op Note (Signed)
03/26/2015  10:10 AM  PATIENT:  Cynthia Peters  65 y.o. female  PRE-OPERATIVE DIAGNOSIS:  INCISIONAL HERNIA  POST-OPERATIVE DIAGNOSIS:INCISIONAL HERNIA  PROCEDURE:  Procedure(s): LAPAROSCOPIC LYSIS OF ADHESIONS x41min (N/A) LAPAROSCOPIC INCISIONAL HERNIA (N/A)  SURGEON:  Surgeon(s) and Role:    * Ralene Ok, MD - Primary  ANESTHESIA:   local and general  EBL: 25cc  Total I/O In: 1000 [I.V.:1000] Out: -   BLOOD ADMINISTERED:none  DRAINS: none   LOCAL MEDICATIONS USED:  BUPIVICAINE  And Exparel  SPECIMEN:  No Specimen  DISPOSITION OF SPECIMEN:  N/A  COUNTS:  YES  TOURNIQUET:  * No tourniquets in log *  DICTATION: .Dragon Dictation  Details of the procedure:   After the patient was consented patient was taken back to the operating room patient was then placed in supine position bilateral SCDs in place.  The patient was prepped and draped in the usual sterile fashion. After antibiotics were confirmed a timeout was called and all facts were verified. The Veress needle technique was used to insuflate the abdomen at Palmer's point. The abdomen was insufflated to 14 mm mercury. Subsequently a 5 mm trocar was placed a camera inserted there was no injury to any intra-abdominal organs.  There was a large amount of omental adhesions.  Adhesions were taken down sharply and with cautery to maintain hemostasis. Adhesiolysis took approximately 26min.  A 3rd 62mm trocar was place in the right lower quadrant under direct visualization.  The hernia was seen and it appeared the mesh had pulled from the superior portion of the securing sights.  The sutures appeared in place but the mesh appeared to have slipped down. The hernia measured 9x6cm.  The fascia was reapproximated with 0 Novofils in an interrupted fashion.  Exparel was than infiltrated in the preperitoneal spaces along the fascial closure.  At this time a Bard Ventralight 15 x 20cm cm  mesh was inserted into the abdomen.   The mesh was secured circumferentially with am tacker in a double crown fashion.  Eviseal was used to secure the mesh to the peritoneum superiorly to the ribs. 2-0 prolenes were used at 3:00, 6:00, & 9:00 as transfascial sutures using a endoclose decive.    The omentum was brought over the area of the mesh. The pneumoperitoneum was evacuated  & all trocars  were removed. The skin was reapproximated with 4-0  Monocryl sutures in a subcuticular fashion. The skin was dressed with Steri-Strips tape and gauze.  The patient was taken to the recovery room in stable condition.   PLAN OF CARE: Admit for overnight observation  PATIENT DISPOSITION:  PACU - hemodynamically stable.   Delay start of Pharmacological VTE agent (>24hrs) due to surgical blood loss or risk of bleeding: yes

## 2015-03-26 NOTE — Discharge Instructions (Signed)
[facility] _______[facility] ° °HERNIA REPAIR: POST OP INSTRUCTIONS ° °Always review your discharge instruction sheet given to you by the facility where your surgery was performed. °IF YOU HAVE DISABILITY OR FAMILY LEAVE FORMS, YOU MUST BRING THEM TO THE OFFICE FOR PROCESSING.   °DO NOT GIVE THEM TO YOUR DOCTOR. ° °1. A  prescription for pain medication may be given to you upon discharge.  Take your pain medication as prescribed, if needed.  If narcotic pain medicine is not needed, then you may take acetaminophen (Tylenol) or ibuprofen (Advil) as needed. °2. Take your usually prescribed medications unless otherwise directed. °3. If you need a refill on your pain medication, please contact your pharmacy.  They will contact our office to request authorization. Prescriptions will not be filled after 5 pm or on week-ends. °4. You should follow a light diet the first 24 hours after arrival home, such as soup and crackers, etc.  Be sure to include lots of fluids daily.  Resume your normal diet the day after surgery. °5. Most patients will experience some swelling and bruising around the umbilicus or in the groin and scrotum.  Ice packs and reclining will help.  Swelling and bruising can take several days to resolve.  °6. It is common to experience some constipation if taking pain medication after surgery.  Increasing fluid intake and taking a stool softener (such as Colace) will usually help or prevent this problem from occurring.  A mild laxative (Milk of Magnesia or Miralax) should be taken according to package directions if there are no bowel movements after 48 hours. °7. Unless discharge instructions indicate otherwise, you may remove your bandages 24-48 hours after surgery, and you may shower at that time.  You may have steri-strips (small skin tapes) in place directly over the incision.  These strips should be left on the skin for 7-10 days.  If your surgeon used skin glue on the incision, you may shower  in 24 hours.  The glue will flake off over the next 2-3 weeks.  Any sutures or staples will be removed at the office during your follow-up visit. °8. ACTIVITIES:  You may resume regular (light) daily activities beginning the next day--such as daily self-care, walking, climbing stairs--gradually increasing activities as tolerated.  You may have sexual intercourse when it is comfortable.  Refrain from any heavy lifting or straining until approved by your doctor. °a. You may drive when you are no longer taking prescription pain medication, you can comfortably wear a seatbelt, and you can safely maneuver your car and apply brakes. °b. RETURN TO WORK:  __________________________________________________________ °9. You should see your doctor in the office for a follow-up appointment approximately 2-3 weeks after your surgery.  Make sure that you call for this appointment within a day or two after you arrive home to insure a convenient appointment time. °10. OTHER INSTRUCTIONS:  __________________________________________________________________________________________________________________________________________________________________________________________  °WHEN TO CALL YOUR DOCTOR: °1. Fever over 101.0 °2. Inability to urinate °3. Nausea and/or vomiting °4. Extreme swelling or bruising °5. Continued bleeding from incision. °6. Increased pain, redness, or drainage from the incision ° °The clinic staff is available to answer your questions during regular business hours.  Please don’t hesitate to call and ask to speak to one of the nurses for clinical concerns.  If you have a medical emergency, go to the nearest emergency room or call 911.  A surgeon from [facility] is always on call at the hospital ° ° °[address], [address], [place], [place]  [address] ? ° [address] [address], [place], [place]   [address] °[phone] ? [phone] ? FAX [phone] °Web site: [email or web address] ° °

## 2015-03-26 NOTE — Anesthesia Postprocedure Evaluation (Signed)
Anesthesia Post Note  Patient: Cynthia Peters  Procedure(s) Performed: Procedure(s) (LRB): LAPAROSCOPIC LYSIS OF ADHESIONS (N/A) LAPAROSCOPIC INCISIONAL HERNIA (N/A)  Patient location during evaluation: PACU Anesthesia Type: General Level of consciousness: awake and alert Pain management: pain level controlled Vital Signs Assessment: post-procedure vital signs reviewed and stable Respiratory status: spontaneous breathing, nonlabored ventilation, respiratory function stable and patient connected to nasal cannula oxygen Cardiovascular status: blood pressure returned to baseline and stable Postop Assessment: no signs of nausea or vomiting Anesthetic complications: no Comments: Complaining of epigastic pain likely from insufflation gas irritation of the diaphragm. EKG normal in PACU.     Last Vitals:  Filed Vitals:   03/26/15 1127 03/26/15 1130  BP: 118/78   Pulse: 67 70  Temp:    Resp: 18 17    Last Pain:  Filed Vitals:   03/26/15 1142  PainSc: 10-Worst pain ever                 Effie Berkshire

## 2015-03-26 NOTE — Transfer of Care (Signed)
Immediate Anesthesia Transfer of Care Note  Patient: Cynthia Peters  Procedure(s) Performed: Procedure(s): LAPAROSCOPIC LYSIS OF ADHESIONS (N/A) LAPAROSCOPIC INCISIONAL HERNIA (N/A)  Patient Location: PACU  Anesthesia Type:General  Level of Consciousness: awake, alert , oriented and patient cooperative  Airway & Oxygen Therapy: Patient Spontanous Breathing and Patient connected to nasal cannula oxygen  Post-op Assessment: Report given to RN, Post -op Vital signs reviewed and stable and Patient moving all extremities  Post vital signs: Reviewed and stable  Last Vitals:  Filed Vitals:   03/26/15 0628  BP: 147/61  Pulse: 64  Temp: 37.1 C  Resp: 16    Complications: No apparent anesthesia complications

## 2015-03-26 NOTE — H&P (Signed)
History of Present Illness Ralene Ok MD; 03/05/2015 10:46 AM) Patient words: discuss surgery.  The patient is a 65 year old female who presents with an incisional hernia. Patient is a 65 year old female who a previous TAR hernia repair in September 2016. Patient was seen previously secondary to epigastric bulge. Patient with CT scan which revealed a recurrent incisional hernia. The patient continues with pain in the epigastrium. We reviewed the CT scan on today's visit.   Allergies (Ammie Eversole, LPN; 579FGE QA348G AM) TraMADol HCl *ANALGESICS - OPIOID* HYDROcodone Bitartrate *CHEMICALS*  Medication History (Ammie Eversole, LPN; 579FGE QA348G AM) Gabapentin (300MG  Capsule, 1 (one) Oral tid, Taken starting 02/22/2015) Active. Diazepam (5MG  Tablet, Oral) Active. Zolpidem Tartrate (10MG  Tablet, Oral) Active. BuPROPion HCl ER (XL) (300MG  Tablet ER 24HR, Oral) Active. Dexamethasone (0.5MG /5ML Solution, Oral) Active. Fluconazole (150MG  Tablet, Oral) Active. Fluorometholone (0.1% Suspension, Ophthalmic) Active. Hydrochlorothiazide (25MG  Tablet, Oral) Active. Ketorolac Tromethamine (10MG  Tablet, Oral) Active. Klor-Con M10 (10MEQ Tablet ER, Oral) Active. Klor-Con M20 [facility] Tablet ER, Oral) Active. Losartan Potassium (50MG  Tablet, Oral) Active. Lotemax (0.5% Suspension, Ophthalmic) Active. Metoprolol Succinate ER (50MG  Tablet ER 24HR, Oral) Active. Ondansetron HCl (8MG  Tablet, Oral) Active. Pazeo (0.7% Solution, Ophthalmic) Active. Phenazopyridine HCl (200MG  Tablet, Oral) Active. PreviDent 5000 Dry Mouth (1.1% Gel, Dental) Active. Restasis (0.05% Emulsion, Ophthalmic) Active. Simvastatin (40MG  Tablet, Oral) Active. ValACYclovir HCl (500MG  Tablet, Oral) Active. Ventolin HFA (108 (90 Base)MCG/ACT Aerosol Soln, Inhalation) Active. Vitamin D (Ergocalciferol) (50000UNIT Capsule, Oral) Active. Medications Reconciled  Vitals (Ammie Eversole LPN; 579FGE  QA348G AM) 03/05/2015 10:11 AM Weight: 161 lb Height: 64in Body Surface Area: 1.78 m Body Mass Index: 27.64 kg/m  Temp.: 98.49F(Oral)  Pulse: 82 (Regular)  BP: 152/80 (Sitting, Left Arm, Standard)       Physical Exam Ralene Ok MD; 03/05/2015 10:47 AM) General Mental Status-Alert. General Appearance-Consistent with stated age. Hydration-Well hydrated. Voice-Normal.  Chest and Lung Exam Chest and lung exam reveals -quiet, even and easy respiratory effort with no use of accessory muscles and on auscultation, normal breath sounds, no adventitious sounds and normal vocal resonance. Inspection Chest Wall - Normal. Back - normal.  Cardiovascular Cardiovascular examination reveals -normal heart sounds, regular rate and rhythm with no murmurs and normal pedal pulses bilaterally.  Abdomen Inspection Skin - Scar - no surgical scars. Hernias - Incisional - Reducible(Large epigastric incisional hernia.). Palpation/Percussion Palpation and Percussion of the abdomen reveal - Soft, Non Tender, No Rebound tenderness, No Rigidity (guarding) and No hepatosplenomegaly. Auscultation Auscultation of the abdomen reveals - Bowel sounds normal.    Assessment & Plan Ralene Ok MD; 03/05/2015 10:50 AM) RECURRENT INCISIONAL HERNIA (K43.2) Impression: Patient is a 65 year old female with recurrent incisional hernia in the epigastrium. We discussed CT scan results. All questions were answered with the patient and her son over the phone.  1. The patient will like to proceed to the operating room for laparoscopic lysis of adhesions and incisional hernia repair with mesh.  2. I discussed with the patient the risks and benefits of the procedure to include but not limited to: Infection, bleeding, damage to surrounding structures, possible need for further surgery, possible nerve pain, and possible recurrence. We also discussed the possibility of having to proceed to open.  The patient was understanding and wishes to proceed.

## 2015-03-26 NOTE — Anesthesia Procedure Notes (Signed)
Procedure Name: Intubation Date/Time: 03/26/2015 7:39 AM Performed by: Terrill Mohr Pre-anesthesia Checklist: Patient identified, Emergency Drugs available, Suction available and Patient being monitored Patient Re-evaluated:Patient Re-evaluated prior to inductionOxygen Delivery Method: Circle system utilized Preoxygenation: Pre-oxygenation with 100% oxygen Intubation Type: IV induction Ventilation: Mask ventilation without difficulty Laryngoscope Size: Mac and 3 Grade View: Grade I Tube type: Oral Tube size: 7.5 mm Number of attempts: 1 Airway Equipment and Method: Stylet Placement Confirmation: ETT inserted through vocal cords under direct vision,  positive ETCO2 and breath sounds checked- equal and bilateral Secured at: 22 (cm at teeth) cm Tube secured with: Tape Dental Injury: Teeth and Oropharynx as per pre-operative assessment

## 2015-03-27 ENCOUNTER — Encounter (HOSPITAL_COMMUNITY): Payer: Self-pay | Admitting: General Surgery

## 2015-03-27 DIAGNOSIS — K432 Incisional hernia without obstruction or gangrene: Secondary | ICD-10-CM | POA: Diagnosis not present

## 2015-03-27 NOTE — Progress Notes (Signed)
1 Day Post-Op  Subjective: Pt doing well this AM.  Soreness slightly improved.  Objective: Vital signs in last 24 hours: Temp:  [98 F (36.7 C)-100.7 F (38.2 C)] 100.7 F (38.2 C) (02/21 0511) Pulse Rate:  [57-84] 80 (02/21 0511) Resp:  [7-26] 15 (02/21 0511) BP: (99-147)/(52-99) 112/55 mmHg (02/21 0511) SpO2:  [79 %-100 %] 93 % (02/21 0511) Weight:  [72.213 kg (159 lb 3.2 oz)-77.2 kg (170 lb 3.1 oz)] 77.2 kg (170 lb 3.1 oz) (02/20 1709) Last BM Date: 03/25/15  Intake/Output from previous day: 02/20 0701 - 02/21 0700 In: 2300 [P.O.:300; I.V.:2000] Out: 305 [Urine:300; Blood:5] Intake/Output this shift: Total I/O In: 300 [P.O.:300] Out: 300 [Urine:300]  General appearance: alert and cooperative GI: soft, approp ttp, ND, incisions dressed   Anti-infectives: Anti-infectives    Start     Dose/Rate Route Frequency Ordered Stop   03/25/15 0825  ceFAZolin (ANCEF) IVPB 2 g/50 mL premix  Status:  Discontinued     2 g 100 mL/hr over 30 Minutes Intravenous On call to O.R. 03/25/15 0825 03/26/15 1658      Assessment/Plan: s/p Procedure(s): LAPAROSCOPIC LYSIS OF ADHESIONS (N/A) LAPAROSCOPIC INCISIONAL HERNIA (N/A) mobilize  Adv diet as tol PT consult     Rosario Jacks., Anne Hahn 03/27/2015

## 2015-03-27 NOTE — Evaluation (Signed)
Physical Therapy Evaluation Patient Details Name: Cynthia Peters MRN: [id number] DOB: 1950-11-06 Today's Date: 03/27/2015   History of Present Illness  The patient is a 65 year old female who presents with an incisional hernia. Patient is a 66 year old female who a previous TAR hernia repair in September 2016. Patient was seen previously secondary to epigastric bulge. Patient with CT scan which revealed a recurrent incisional hernia.   Clinical Impression  Pt POD #1 from hernia repair presenting with the PT deficits listed below. Requiring close supervision and cueing for safe transitions with tips to decrease abdominal discomfort. Mobility wise pt moving well and needing no physical assist however very weary on increasing speed of ambulation or attempting ambulation w/o AD as she was walking PTA. Talked through stair sequencing today but would benefit from stair trail tomorrow for pt's peace of mind. Will continue to follow acutely and recommending supervision/assistance intermittently upon return home.    Follow Up Recommendations No PT follow up;Supervision - Intermittent    Equipment Recommendations  Rolling walker with 5" wheels;3in1 (PT)    Recommendations for Other Services       Precautions / Restrictions Precautions Precautions: Other (comment) (abdominal incision) Precaution Comments: wears an abdominal binder Restrictions Weight Bearing Restrictions: No      Mobility  Bed Mobility Overal bed mobility: Needs Assistance Bed Mobility: Rolling;Sit to Sidelying Rolling: Min guard       Sit to sidelying: Min guard General bed mobility comments: VC's to log roll to get back in bed for pain relief  Transfers Overall transfer level: Modified independent Equipment used: Rolling walker (2 wheeled)             General transfer comment: steady upon inital stand, stood from recliner and toilet w/o safety concerns  Ambulation/Gait Ambulation/Gait assistance:  Supervision Ambulation Distance (Feet): 500 Feet Assistive device: Rolling walker (2 wheeled) Gait Pattern/deviations: Step-through pattern;Decreased stride length;Antalgic;Trunk flexed Gait velocity: decreased Gait velocity interpretation: Below normal speed for age/gender General Gait Details: VCs to relax shoulders and increase foot clearance, improved with longer distance, pt steady w/ RW but hesitant to try w/o AD  Stairs Stairs:  (talked through sequencing)          Wheelchair Mobility    Modified Rankin (Stroke Patients Only)       Balance Overall balance assessment: Needs assistance   Sitting balance-Leahy Scale: Good     Standing balance support: Bilateral upper extremity supported;During functional activity Standing balance-Leahy Scale: Fair                               Pertinent Vitals/Pain Pain Assessment: 0-10 Pain Score: 6  Pain Location: upper abdomen Pain Descriptors / Indicators: Constant Pain Intervention(s): Limited activity within patient's tolerance;Monitored during session    Home Living Family/patient expects to be discharged to:: Private residence Living Arrangements: Children Available Help at Discharge: Available PRN/intermittently;Family;Friend(s) Type of Home: House Home Access: Stairs to enter   [employer] of Steps: 3 Home Layout: Two level Home Equipment: None      Prior Function Level of Independence: Independent         Comments: college aged son lives at home, pt independnet in all ADLs and drives     Hand Dominance   Dominant Hand: Right    Extremity/Trunk Assessment   Upper Extremity Assessment: Overall WFL for tasks assessed           Lower Extremity Assessment: Overall  WFL for tasks assessed      Cervical / Trunk Assessment: Normal  Communication   Communication: No difficulties  Cognition Arousal/Alertness: Awake/alert Behavior During Therapy: WFL for tasks  assessed/performed Overall Cognitive Status: Within Functional Limits for tasks assessed                      General Comments General comments (skin integrity, edema, etc.): Assisted pt to the bathroom, Mod I with all bathroom ADLs    Exercises        Assessment/Plan    PT Assessment Patient needs continued PT services  PT Diagnosis Acute pain   PT Problem List Decreased activity tolerance;Decreased mobility;Decreased knowledge of use of DME  PT Treatment Interventions DME instruction;Gait training;Stair training;Functional mobility training;Therapeutic activities;Therapeutic exercise;Balance training   PT Goals (Current goals can be found in the Care Plan section) Acute Rehab PT Goals Patient Stated Goal: decrease pain to go home PT Goal Formulation: With patient Time For Goal Achievement: 04/10/15 Potential to Achieve Goals: Good    Frequency Min 3X/week   Barriers to discharge Decreased caregiver support      Co-evaluation               End of Session Equipment Utilized During Treatment: Gait belt Activity Tolerance: Patient tolerated treatment well Patient left: in bed;with call bell/phone within reach Nurse Communication: Mobility status         Time: [id number] PT Time Calculation (min) (ACUTE ONLY): 27 min   Charges:   PT Evaluation $PT Eval Moderate Complexity: 1 Procedure PT Treatments $Gait Training: 8-22 mins   PT G Codes:        Ara Kussmaul Apr 11, 2015, 4:35 PM Ara Kussmaul, Student Physical Therapist Acute Rehab [phone]

## 2015-03-28 DIAGNOSIS — K432 Incisional hernia without obstruction or gangrene: Secondary | ICD-10-CM | POA: Diagnosis not present

## 2015-03-28 NOTE — Progress Notes (Signed)
Physical Therapy Treatment Patient Details Name: Cynthia Peters MRN: [id number] DOB: [birth year] Today's Date: 03/28/2015    History of Present Illness The patient is a 65 year old female who presents with an incisional hernia. Patient is a 65 year old female who a previous TAR hernia repair in September 2016. Patient was seen previously secondary to epigastric bulge. Patient with CT scan which revealed a recurrent incisional hernia.     PT Comments    Pt mobility limited today by dizziness after pain meds as well as not feeling well with fever, ambulated 250' with RW, did not attempt stairs. Advised pt to walk again later today with stairs or family. PT will continue to follow.   Follow Up Recommendations  No PT follow up;Supervision - Intermittent     Equipment Recommendations  Rolling walker with 5" wheels;3in1 (PT)    Recommendations for Other Services       Precautions / Restrictions Precautions Precautions: Other (comment) Precaution Comments: wears an abdominal binder Restrictions Weight Bearing Restrictions: No    Mobility  Bed Mobility Overal bed mobility: Modified Independent Bed Mobility: Supine to Sit     Supine to sit: Modified independent (Device/Increase time)   Sit to sidelying: Modified independent (Device/Increase time) General bed mobility comments: pt able to get in and out of bed without assistance, HOB minimally elevated.   Transfers Overall transfer level: Modified independent Equipment used: None             General transfer comment: mod I from bed and toilet. No AD used within room and pt steady but reported mild dizziness after pain meds so RW used for ambulation in hallway  Ambulation/Gait Ambulation/Gait assistance: Supervision Ambulation Distance (Feet): 250 Feet Assistive device: Rolling walker (2 wheeled) Gait Pattern/deviations: Step-through pattern;Trunk flexed Gait velocity: decreased Gait velocity interpretation:  Below normal speed for age/gender General Gait Details: guarded gait. Distance minimized today due to pt fever and dizziness   Stairs Stairs:  (pt dizzy today)          Wheelchair Mobility    Modified Rankin (Stroke Patients Only)       Balance Overall balance assessment: Needs assistance Sitting-balance support: No upper extremity supported Sitting balance-Leahy Scale: Good     Standing balance support: No upper extremity supported;During functional activity Standing balance-Leahy Scale: Good Standing balance comment: pt able to stand and perform short distance reaching with no LOB, but unable to move quickly without AD                    Cognition Arousal/Alertness: Awake/alert Behavior During Therapy: WFL for tasks assessed/performed Overall Cognitive Status: Within Functional Limits for tasks assessed                      Exercises      General Comments        Pertinent Vitals/Pain Pain Assessment: 0-10 Pain Score: 7  Pain Location: upper abdomen Pain Descriptors / Indicators: Constant Pain Intervention(s): Limited activity within patient's tolerance;Monitored during session;Premedicated before session    Home Living                      Prior Function            PT Goals (current goals can now be found in the care plan section) Acute Rehab PT Goals Patient Stated Goal: decrease pain to go home PT Goal Formulation: With patient Time For Goal Achievement: 04/10/15 Potential to Achieve Goals:  Good Progress towards PT goals: Not progressing toward goals - comment (dizzy and fever)    Frequency  Min 3X/week    PT Plan Current plan remains appropriate    Co-evaluation             End of Session Equipment Utilized During Treatment: Gait belt Activity Tolerance: Other (comment) (limited by dizziness after pain meds) Patient left: in bed;with call bell/phone within reach     Time: 1012-1034 PT Time Calculation  (min) (ACUTE ONLY): 22 min  Charges:  $Gait Training: 8-22 mins                    G Codes:     Leighton Roach, PT  Acute Rehab Services  [place], [place] 03/28/2015, 11:11 AM

## 2015-03-28 NOTE — Progress Notes (Signed)
2 Days Post-Op  Subjective: Pt doing well this AM.  Walked with PT yesterday. tol PO.  Pain improving  Objective: Vital signs in last 24 hours: Temp:  [98.3 F (36.8 C)-100 F (37.8 C)] 100 F (37.8 C) (02/22 0509) Pulse Rate:  [70-87] 87 (02/22 0509) Resp:  [16] 16 (02/22 0509) BP: (101-140)/(49-74) 140/74 mmHg (02/22 0509) SpO2:  [93 %-98 %] 97 % (02/22 0509) Last BM Date: 03/25/15  Intake/Output from previous day: 02/21 0701 - 02/22 0700 In: 3135 [P.O.:1420; I.V.:1715] Out: 1900 [Urine:1900] Intake/Output this shift:    General appearance: alert and cooperative GI: soft, non-tender; bowel sounds normal; no masses,  no organomegaly and incisions c/d/i   Anti-infectives: Anti-infectives    Start     Dose/Rate Route Frequency Ordered Stop   03/25/15 0825  ceFAZolin (ANCEF) IVPB 2 g/50 mL premix  Status:  Discontinued     2 g 100 mL/hr over 30 Minutes Intravenous On call to O.R. 03/25/15 0825 03/26/15 1658      Assessment/Plan: s/p Procedure(s): LAPAROSCOPIC LYSIS OF ADHESIONS (N/A) LAPAROSCOPIC INCISIONAL HERNIA (N/A) Advance diet  con't to mobilize Hopefully home in AM     [place]., [place] 03/28/2015

## 2015-03-29 DIAGNOSIS — K432 Incisional hernia without obstruction or gangrene: Secondary | ICD-10-CM | POA: Diagnosis not present

## 2015-03-29 LAB — RAPID STREP SCREEN (MED CTR MEBANE ONLY): Streptococcus, Group A Screen (Direct): NEGATIVE

## 2015-03-29 MED ORDER — PHENOL 1.4 % MT LIQD: 1.0000 | OROMUCOSAL | Status: DC | PRN

## 2015-03-29 MED ORDER — DIAZEPAM 5 MG PO TABS
5.0000 mg | ORAL_TABLET | Freq: Every day | ORAL | Status: DC
  Administered 2015-03-29: 5 mg via ORAL
  Filled 2015-03-29 (×2): qty 1

## 2015-03-29 MED ORDER — DIAZEPAM 5 MG PO TABS: 5.0000 mg | ORAL_TABLET | Freq: Four times a day (QID) | ORAL | Status: DC | PRN

## 2015-03-29 NOTE — Progress Notes (Signed)
3 Days Post-Op  Subjective: Pt with some con't soreness today.  Pt amb well with PT.  C/O sore throat this AM.  Objective: Vital signs in last 24 hours: Temp:  [98.4 F (36.9 C)-98.8 F (37.1 C)] 98.6 F (37 C) (02/23 0500) Pulse Rate:  [64-74] 74 (02/23 0500) Resp:  [16] 16 (02/23 0500) BP: (103-124)/(54-61) 124/61 mmHg (02/23 0500) SpO2:  [95 %-99 %] 95 % (02/23 0500) Last BM Date: 03/25/15  Intake/Output from previous day: 02/22 0701 - 02/23 0700 In: 1080 [P.O.:1080] Out: 1850 [Urine:1850] Intake/Output this shift:    General appearance: alert and cooperative GI: soft, approp ttp, ND incisions c/d/i   Anti-infectives: Anti-infectives    Start     Dose/Rate Route Frequency Ordered Stop   03/25/15 0825  ceFAZolin (ANCEF) IVPB 2 g/50 mL premix  Status:  Discontinued     2 g 100 mL/hr over 30 Minutes Intravenous On call to O.R. 03/25/15 0825 03/26/15 1658      Assessment/Plan: s/p Procedure(s): LAPAROSCOPIC LYSIS OF ADHESIONS (N/A) LAPAROSCOPIC INCISIONAL HERNIA (N/A) Con't with PT this AM  Add muscle relaxer qhs Will check rapid strep per pt request Plan for DC in AM     [place]., [place] 03/29/2015

## 2015-03-30 DIAGNOSIS — K432 Incisional hernia without obstruction or gangrene: Secondary | ICD-10-CM | POA: Diagnosis not present

## 2015-03-30 MED ORDER — DIAZEPAM 5 MG PO TABS: 5.0000 mg | ORAL_TABLET | Freq: Every day | ORAL | Status: DC

## 2015-03-30 MED ORDER — NYSTATIN 100000 UNIT/ML MT SUSP: 5.0000 mL | Freq: Four times a day (QID) | OROMUCOSAL | Status: AC

## 2015-03-30 NOTE — Discharge Summary (Signed)
Physician Discharge Summary  Patient ID: Cynthia Peters MRN: [id number] DOB/AGE: August 17, 1950 65 y.o.  Admit date: 03/26/2015 Discharge date: 03/30/2015  Admission Diagnoses: s/p lap incisional hernia repair with mesh  Discharge Diagnoses:  Active Problems:   S/P hernia repair   Discharged Condition: good  Hospital Course: Patient is status post laparoscopic ventral hernia repair with mesh. Patient was admitted to the floor postoperatively for pain control. This was achieved in the next 3-4 days. Patient was worked with physical therapy for ambulation. Patient was tolerating by mouth, regular prior to his discharge.  Patient was having flatus and had not had a bowel movement. Patient does have a history of IBS. Patient was otherwise afebrile, detail for discharge, discharge home.  Consults: None  Significant Diagnostic Studies: none  Treatments: surgery: as above  Discharge Exam: Blood pressure 139/73, pulse 73, temperature 98.3 F (36.8 C), temperature source Oral, resp. rate 16, height 5\' 4"  (1.626 m), weight 77.2 kg (170 lb 3.1 oz), SpO2 99 %. General appearance: alert and cooperative GI: Incisions clean dry and intact, soft, nontender  Disposition: 01-Home or Self Care  Discharge Instructions    Diet - low sodium heart healthy    Complete by:  As directed      Increase activity slowly    Complete by:  As directed             Medication List    TAKE these medications        acetaminophen 500 MG tablet  Commonly known as:  TYLENOL  Take 1,000 mg by mouth every 6 (six) hours as needed (pain).     acetaminophen-codeine 300-30 MG tablet  Commonly known as:  TYLENOL #3  Take 1 tablet by mouth every 6 (six) hours as needed for moderate pain.     albuterol 108 (90 Base) MCG/ACT inhaler  Commonly known as:  PROVENTIL HFA;VENTOLIN HFA  Inhale 2 puffs into the lungs every 6 (six) hours as needed for wheezing or shortness of breath.     ALIGN PO  Take 1 capsule  by mouth daily.     buPROPion 300 MG 24 hr tablet  Commonly known as:  WELLBUTRIN XL  Take 300 mg by mouth daily.     CALTRATE 600+D 600-400 MG-UNIT tablet  Generic drug:  Calcium Carbonate-Vitamin D  Take 1 tablet by mouth daily.     clobetasol ointment 0.05 %  Commonly known as:  TEMOVATE  Apply topically as needed. APPLY TO AFFECTED AREA TWICE A DAY     Co Q 10 100 MG Caps  Take 100 mg by mouth daily.     cycloSPORINE 0.05 % ophthalmic emulsion  Commonly known as:  RESTASIS  Place 1 drop into both eyes 2 (two) times daily.     dexamethasone 0.5 MG/5ML solution  Commonly known as:  DECADRON  TAKE 10 ML BY MOUTH daily as needed  AS A MOUTHRINSE     diazepam 5 MG tablet  Commonly known as:  VALIUM  Take 2.5-5 mg by mouth every 6 (six) hours as needed.     diazepam 5 MG tablet  Commonly known as:  VALIUM  Take 1 tablet (5 mg total) by mouth at bedtime.     dicyclomine 10 MG capsule  Commonly known as:  BENTYL  Take 10 mg by mouth every 4 (four) hours as needed (for abdominal cramps).     Evening Primrose Oil 1000 MG Caps  Take 2,000 mg by mouth daily.  Flaxseed (Linseed) 1000 MG Caps  Take 2,000 mg by mouth daily.     fluticasone 50 MCG/ACT nasal spray  Commonly known as:  FLONASE  Place 1 spray into both nostrils 2 (two) times daily as needed for allergies or rhinitis.     gabapentin 300 MG capsule  Commonly known as:  NEURONTIN  Take 300 mg by mouth 3 (three) times daily.     hydrochlorothiazide 25 MG tablet  Commonly known as:  HYDRODIURIL  Take 25 mg by mouth daily.     ibuprofen 200 MG tablet  Commonly known as:  ADVIL,MOTRIN  Take 400-600 mg by mouth 2 (two) times daily as needed (pain).     KLOR-CON M20 20 MEQ tablet  Generic drug:  potassium chloride SA  TAKE 20 MEQ BY MOUTH DAILY. TAKE 20 MEQ AND 10 MEQ TO = 30 MEQ DAILY     KLOR-CON M10 10 MEQ tablet  Generic drug:  potassium chloride  TAKE 1 TABLET (10 MEQ TOTAL) BY MOUTH DAILY. TAKE WITH  THE 20 MEQ TABLET DAILY TO = 30 MEQ DAILY     lidocaine 5 % ointment  Commonly known as:  XYLOCAINE  Apply topically 3 (three) times daily as needed.     loratadine 10 MG tablet  Commonly known as:  CLARITIN  Take 10 mg by mouth daily as needed for allergies.     losartan 50 MG tablet  Commonly known as:  COZAAR  Take 50 mg by mouth daily.     LOTEMAX 0.5 % ophthalmic suspension  Generic drug:  loteprednol  Place 1 drop into both eyes 4 (four) times daily. As needed     MAGNESIUM PO  Take 1 tablet by mouth daily.     meclizine 25 MG tablet  Commonly known as:  ANTIVERT  Take 1 tablet (25 mg total) by mouth 3 (three) times daily as needed for dizziness.     metoprolol succinate 50 MG 24 hr tablet  Commonly known as:  TOPROL-XL  Take 2 tablets (100 mg total) by mouth daily. OK TAKE EXTRA 1/2 TAB FOR BREAKTHROUGH PALPITATIONS     montelukast 10 MG tablet  Commonly known as:  SINGULAIR  Take 10 mg by mouth daily as needed (seasonal allergies).     multivitamin with minerals Tabs tablet  Take 1 tablet by mouth daily.     nystatin 100000 UNIT/ML suspension  Commonly known as:  MYCOSTATIN  Take 5 mLs (500,000 Units total) by mouth 4 (four) times daily.     nystatin cream  Commonly known as:  MYCOSTATIN  Apply 1 application topically 2 (two) times daily. Apply to affected area BID for up to 7 days.     omeprazole 20 MG tablet  Commonly known as:  PRILOSEC OTC  Take 20 mg by mouth daily.     oxyCODONE 5 MG immediate release tablet  Commonly known as:  ROXICODONE  Take 1 tablet (5 mg total) by mouth every 4 (four) hours as needed for severe pain.     phenazopyridine 200 MG tablet  Commonly known as:  PYRIDIUM  Take 1 tablet (200 mg total) by mouth 3 (three) times daily as needed for pain.     PREVIDENT 5000 DRY MOUTH 1.1 % Gel dental gel  Generic drug:  sodium fluoride  USE ONCE DAILY IN PLACE OF REGULAR TOOTHEPASTE as needed daily     simvastatin 40 MG tablet   Commonly known as:  ZOCOR  Take 40 mg by  mouth daily.     valACYclovir 500 MG tablet  Commonly known as:  VALTREX  Increase to bid x 3 day with outbreak     Vitamin D 2000 units Caps  Take 2,000 Units by mouth daily.     zolpidem 10 MG tablet  Commonly known as:  AMBIEN  TAKE 1/2 TABLET BY MOUTH AT BEDTIME AS NEEDED FOR SLEEP           Follow-up Information    Follow up with Reyes Ivan, MD. Schedule an appointment as soon as possible for a visit in 3 weeks.   Specialty:  General Surgery   Why:  For wound re-check   Contact information:   [address] [place] [address] [phone]       Signed: Rosario Jacks., [facility] 03/30/2015, 8:39 AM

## 2015-03-30 NOTE — Progress Notes (Signed)
Physical Therapy Treatment Patient Details Name: MIRELLE BIELEFELDT MRN: [id number] DOB: [birth year] Today's Date: 03/30/2015    History of Present Illness The patient is a 65 year old female who presents with an incisional hernia. Patient is a 65 year old female who a previous TAR hernia repair in September 2016. Patient was seen previously secondary to epigastric bulge. Patient with CT scan which revealed a recurrent incisional hernia.     PT Comments    Patient doing very well with mobility and gait.  Able to negotiate flight of stairs today with min guard assist.  Patient ready for d/c from PT perspective.  Follow Up Recommendations  No PT follow up;Supervision - Intermittent     Equipment Recommendations  Rolling walker with 5" wheels;3in1 (PT)    Recommendations for Other Services       Precautions / Restrictions Precautions Precautions: Other (comment) (Abdominal incision) Required Braces or Orthoses: Other Brace/Splint Other Brace/Splint: Abdominal binder Restrictions Weight Bearing Restrictions: No    Mobility  Bed Mobility Overal bed mobility: Modified Independent                Transfers Overall transfer level: Modified independent Equipment used: None                Ambulation/Gait Ambulation/Gait assistance: Supervision Ambulation Distance (Feet): 300 Feet Assistive device: Rolling walker (2 wheeled) Gait Pattern/deviations: Step-through pattern;Decreased stride length Gait velocity: decreased Gait velocity interpretation: Below normal speed for age/gender General Gait Details: Slow, guarded gait pattern.   Stairs Stairs: Yes Stairs assistance: Min guard Stair Management: One rail Left;Step to pattern;Forwards Number of Stairs: 10 General stair comments: Instructed patient on step-to pattern with use of 1 rail to negotiate stairs.  Patient able to perform with min guard assist.  Instructed patient on how to have son safely guard  patient on stairs.  Wheelchair Mobility    Modified Rankin (Stroke Patients Only)       Balance                                    Cognition Arousal/Alertness: Awake/alert Behavior During Therapy: WFL for tasks assessed/performed Overall Cognitive Status: Within Functional Limits for tasks assessed                      Exercises      General Comments        Pertinent Vitals/Pain Pain Assessment: 0-10 Pain Score: 5  (with activity) Pain Location: abdomen Pain Descriptors / Indicators: Sore Pain Intervention(s): Monitored during session;Repositioned    Home Living                      Prior Function            PT Goals (current goals can now be found in the care plan section) Progress towards PT goals: Progressing toward goals    Frequency  Min 3X/week    PT Plan Current plan remains appropriate    Co-evaluation             End of Session Equipment Utilized During Treatment: Gait belt (Abdominal binder) Activity Tolerance: Patient tolerated treatment well Patient left: in bed;with call bell/phone within reach (sitting EOB)     Time: [id number] PT Time Calculation (min) (ACUTE ONLY): 10 min  Charges:  $Gait Training: 8-22 mins  G Codes:      Despina Pole 03/30/2015, 11:22 AM Carita Pian Sanjuana Kava, [place] Pager [phone]

## 2015-03-30 NOTE — Progress Notes (Signed)
Cynthia Peters to be D/C'd  per MD order. Discussed with the patient and all questions fully answered.  VSS, Skin clean, dry and intact without evidence of skin break down, no evidence of skin tears noted.  IV catheter discontinued intact. Site without signs and symptoms of complications. Dressing and pressure applied.  An After Visit Summary was printed and given to the patient. Patient received prescription.  D/c education completed with patient/family including follow up instructions, medication list, d/c activities limitations if indicated, with other d/c instructions as indicated by MD - patient able to verbalize understanding, all questions fully answered.   Patient instructed to return to ED, call 911, or call MD for any changes in condition.   Patient to be escorted via [place], and D/C home via private auto.

## 2015-03-31 LAB — CULTURE, GROUP A STREP (THRC)

## 2015-04-02 NOTE — Progress Notes (Signed)
Physical Therapy Note  These are the associated G-codes for PT note on 04/07/2022 at 1500.    04/08/15 1515  PT G-Codes **NOT FOR INPATIENT CLASS**  Functional Assessment Tool Used clinical judgement  Functional Limitation Mobility: Walking and moving around  Mobility: Walking and Moving Around Current Status [id number]) CJ  Mobility: Walking and Moving Around Goal Status [id number]) CI    Kittie Plater, PT, DPT Pager #: [phone] Office #: [phone]

## 2015-04-24 ENCOUNTER — Other Ambulatory Visit: Payer: Self-pay | Admitting: *Deleted

## 2015-04-24 MED ORDER — POTASSIUM CHLORIDE CRYS ER 20 MEQ PO TBCR: EXTENDED_RELEASE_TABLET | ORAL | Status: DC

## 2015-04-27 ENCOUNTER — Other Ambulatory Visit: Payer: Self-pay | Admitting: Cardiology

## 2015-04-27 MED ORDER — POTASSIUM CHLORIDE CRYS ER 20 MEQ PO TBCR: EXTENDED_RELEASE_TABLET | ORAL | Status: DC

## 2015-05-16 ENCOUNTER — Other Ambulatory Visit: Payer: Self-pay | Admitting: *Deleted

## 2015-05-16 NOTE — Telephone Encounter (Signed)
Potassium refill refused - defer to PCP - was f/up PRN in 2015

## 2015-05-17 ENCOUNTER — Other Ambulatory Visit: Payer: Self-pay | Admitting: *Deleted

## 2015-05-17 MED ORDER — POTASSIUM CHLORIDE CRYS ER 20 MEQ PO TBCR: EXTENDED_RELEASE_TABLET | ORAL | Status: DC

## 2015-05-21 ENCOUNTER — Other Ambulatory Visit: Payer: Self-pay

## 2015-05-30 ENCOUNTER — Other Ambulatory Visit: Payer: Self-pay

## 2015-05-30 DIAGNOSIS — E782 Mixed hyperlipidemia: Secondary | ICD-10-CM | POA: Insufficient documentation

## 2015-05-30 DIAGNOSIS — I10 Essential (primary) hypertension: Secondary | ICD-10-CM | POA: Insufficient documentation

## 2015-05-30 DIAGNOSIS — E785 Hyperlipidemia, unspecified: Secondary | ICD-10-CM | POA: Insufficient documentation

## 2015-05-30 NOTE — Telephone Encounter (Signed)
Patient last seen in 2015 - PRN follow-up. Deferred to patient's PCP. Rx refused.

## 2015-06-19 ENCOUNTER — Ambulatory Visit (INDEPENDENT_AMBULATORY_CARE_PROVIDER_SITE_OTHER): Payer: BC Managed Care – PPO | Admitting: Podiatry

## 2015-06-19 ENCOUNTER — Encounter: Payer: Self-pay | Admitting: Podiatry

## 2015-06-19 ENCOUNTER — Ambulatory Visit (INDEPENDENT_AMBULATORY_CARE_PROVIDER_SITE_OTHER): Payer: BC Managed Care – PPO

## 2015-06-19 VITALS — BP 146/83 | HR 69 | Resp 16

## 2015-06-19 DIAGNOSIS — M779 Enthesopathy, unspecified: Secondary | ICD-10-CM

## 2015-06-19 DIAGNOSIS — M79671 Pain in right foot: Secondary | ICD-10-CM | POA: Diagnosis not present

## 2015-06-19 DIAGNOSIS — M778 Other enthesopathies, not elsewhere classified: Secondary | ICD-10-CM

## 2015-06-19 DIAGNOSIS — M7751 Other enthesopathy of right foot: Secondary | ICD-10-CM

## 2015-06-19 NOTE — Progress Notes (Signed)
She presents today after having not seen her for a year with a chief complaint of pain to the dorsal lateral aspect of the right foot. She states it is only been present for about 2 weeks is hard and it is painful occasionally she also has pain of the lateral aspect of her leg on occasions.  Objective: Vital signs are stable alert and oriented 3. Pulses are palpable. She has pain on palpation fourth and fifth metatarsocuboid articulation with overlying edema and erythema. No open lesions or wounds. Radiographs demonstrate what appears to be osteoarthritic changes. Her rheumatologist is questioning whether or not she may have RA and she will be visiting him shortly.  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  Plan: I injected the area today with Kenalog and local anesthetic discussed appropriate shoe gear with her and recommended that she follow-up with rheumatology.

## 2015-06-28 ENCOUNTER — Ambulatory Visit: Payer: BC Managed Care – PPO | Admitting: Podiatry

## 2015-06-29 ENCOUNTER — Other Ambulatory Visit: Payer: Self-pay | Admitting: Cardiology

## 2015-07-03 ENCOUNTER — Telehealth: Payer: Self-pay | Admitting: *Deleted

## 2015-07-03 NOTE — Telephone Encounter (Signed)
Patient called and stated that she also needs the potassium 37meq sent to [facility] in [place].

## 2015-07-03 NOTE — Telephone Encounter (Signed)
Called and spoke with patient because she has not been seen since 2015 and she was advised at that visit to f/u with Richardson Dopp, PA in 1 month.  She states she received the refill of Kdur 20 meq but did not get the refill of 10 meq tablets; states she was advised at last office visit with Richardson Dopp, PA that she should take 30 meq daily.  I looked at most recent bmet in her chart and her K+ was 4.3.  I advised her to take 20 meq until time of appointment.  She verbalized understanding and agreement and thanked me for the call.

## 2015-07-03 NOTE — Telephone Encounter (Signed)
t last sen in 2015 by scott weaver, she is a Location manager pt.

## 2015-07-03 NOTE — Telephone Encounter (Signed)
Please advise on request. Thanks, MI 

## 2015-07-15 NOTE — Progress Notes (Signed)
Cardiology Office Note:    Date:  07/16/2015   ID:  Cynthia Peters, DOB [birth year], MRN [id number]  PCP:  [name]  Cardiologist:  Dr. Peter Martinique   Electrophysiologist:  n/a  Referring MD: Aletha Halim., PA-C   Chief Complaint  Patient presents with  . Palpitations    follow up    History of Present Illness:     Cynthia Peters is a 65 y.o. female with a hx of palpitations, HTN, HL, GERD. Event monitor in 2007 demonstrated rare PVCs. ETT-Echo in 2010 was normal. She was evaluated by Dr. Peter Martinique in 08/2010 for palpitations. Symptoms were felt to be due to PVCs and prn beta blocker was recommended. She was admitted with a TIA in 05/2011. Echo (05/2011): Mild LVH, EF 65-70%, no WMA, Gr 1 DD, mild AI.Carotid US (05/2011): No ICA stenosis. Last seen in 2/15 by me.  Returns for FU. Here alone. She needs refills on her meds. Her palpitations are generally well controlled with keeping her K+ around 4.  She usually takes K+ 30 mEq QD.  Our office would not refill her medication and she has been taking K+ 20 mEq only in the past week.  Overall, she has noted her BP is running higher at times.  She also notes her palpitations have felt a little more rapid and harder recently.  She denies exertional chest pain, dyspnea, orthopnea, PND, edema. She denies syncope.     Past Medical History  Diagnosis Date  . Palpitations     Event monitor in 2007 with rare PVCs // takes Metoprolol daily  . Urethral stricture   . Hyperlipidemia     takes Simvastatin daily   . H/O hiatal hernia   . Arthritis   . Interstitial cystitis   . IBS (irritable bowel syndrome)     takes Bentyl daily as needed  . Lichen planus   . Mild asthma     Albuterol inhaler as needed  . Claustrophobia     takes Valium daily as needed  . Depression     takes Wellbutrin daily  . Insomnia     takes Ambien nightly  . GERD (gastroesophageal reflux disease)     takes Omeprazole daily   .  Vertigo     takes Meclizine daily as needed  . Seasonal allergies     takes Claritin daily as needed.Takes Singulair daily as needed.  . Hypertension     takes  Losartan daily  . Complication of anesthesia     2013 SURGERY vocal cord bothering pt, used smaller ent tube after 2 hernia repair, no problem after . " I woke up during my MRI and colonoscopy."       . History of blood clots 40 yrs     leg   . Bronchitis   . Cataracts, bilateral     immature  . History of MRSA infection   . History of staph infection   . History of echocardiogram 2013    a. Echo (05/2011): Mild LVH, EF 65-70%, no WMA, Gr 1 DD, mild AI.  Marland Kitchen History of stress test 2010    a. ETT-Echo in 2010 was normal  . History of TIA (transient ischemic attack) 2013    a. Carotid US (05/2011): No ICA stenosis    Past Surgical History  Procedure Laterality Date  . Cesarean section    . Colonoscopy with propofol N/A 08/10/2012    Procedure: COLONOSCOPY WITH PROPOFOL;  Surgeon: Ursula Alert  Wynetta Emery, MD;  Location: [facility];  Service: Endoscopy;  Laterality: N/A;  . Endometrial ablation w/ hydrothermablator  09-27-2002  . Umbilical hernia repair  2013    AND REVISION THE SAME YEAR W/ MESH  . Transthoracic echocardiogram  05-26-2011    MILD LVH/  EF 123456  GRADE I DIASTOLIC DYSFUNCTION/  MILD AR//   06-20-2008  NOMRAL STRESS ECHO  . Wisdom tooth extraction  AGE 76  . Cystoscopy with urethral dilatation N/A 02/18/2013    Procedure: CYSTOSCOPY WITH URETHRAL DILATATION ( NO BALLOON) AND HYDRODISTENSION;  Surgeon: Alexis Frock, MD;  Location: [facility];  Service: Urology;  Laterality: N/A;  . Lysis of adhesion N/A 10/25/2014    Procedure: LYSIS OF ADHESIONS ;  Surgeon: Ralene Ok, MD;  Location: [place];  Service: General;  Laterality: N/A;  . Incisional hernia repair N/A 10/25/2014    Procedure: HERNIA REPAIR INCISIONAL;  Surgeon: Ralene Ok, MD;  Location: [place];  Service: General;  Laterality: N/A;    . Laparotomy N/A 10/25/2014    Procedure: EXPLORATORY LAPAROTOMY;  Surgeon: Ralene Ok, MD;  Location: [place];  Service: General;  Laterality: N/A;  . Mucosal advancement flap N/A 10/25/2014    Procedure: MUCOSAL ADVANCEMENT FLAP;  Surgeon: Ralene Ok, MD;  Location: [place];  Service: General;  Laterality: N/A;  . Insertion of mesh N/A 10/25/2014    Procedure: INSERTION OF MESH;  Surgeon: Ralene Ok, MD;  Location: [place];  Service: General;  Laterality: N/A;  . Laparoscopic lysis of adhesions N/A 03/26/2015    Procedure: LAPAROSCOPIC LYSIS OF ADHESIONS;  Surgeon: Ralene Ok, MD;  Location: [place];  Service: General;  Laterality: N/A;  . Incisional hernia repair N/A 03/26/2015    Procedure: LAPAROSCOPIC INCISIONAL HERNIA;  Surgeon: Ralene Ok, MD;  Location: [place];  Service: General;  Laterality: N/A;    Current Medications: Outpatient Prescriptions Prior to Visit  Medication Sig Dispense Refill  . acetaminophen (TYLENOL) 500 MG tablet Take 1,000 mg by mouth every 6 (six) hours as needed (pain).     Marland Kitchen acetaminophen-codeine (TYLENOL #3) 300-30 MG tablet Take 1 tablet by mouth every 6 (six) hours as needed for moderate pain.     Marland Kitchen albuterol (PROVENTIL HFA;VENTOLIN HFA) 108 (90 BASE) MCG/ACT inhaler Inhale 2 puffs into the lungs every 6 (six) hours as needed for wheezing or shortness of breath.    Marland Kitchen buPROPion (WELLBUTRIN XL) 300 MG 24 hr tablet Take 300 mg by mouth daily.     . Calcium Carbonate-Vitamin D (CALTRATE 600+D) 600-400 MG-UNIT per tablet Take 1 tablet by mouth daily.     . Cholecalciferol (VITAMIN D) 2000 units CAPS Take 2,000 Units by mouth daily.    . Coenzyme Q10 (CO Q 10) 100 MG CAPS Take 100 mg by mouth daily.     . cycloSPORINE (RESTASIS) 0.05 % ophthalmic emulsion Place 1 drop into both eyes 2 (two) times daily.    Marland Kitchen dexamethasone (DECADRON) 0.5 MG/5ML solution TAKE 10 ML BY MOUTH daily as needed  AS A MOUTHRINSE  0  . diazepam (VALIUM) 5 MG tablet Take 2.5-5  mg by mouth every 6 (six) hours as needed for anxiety.   0  . dicyclomine (BENTYL) 10 MG capsule Take 10 mg by mouth every 4 (four) hours as needed (for abdominal cramps).   1  . Evening Primrose Oil 1000 MG CAPS Take 2,000 mg by mouth daily.     . Flaxseed, Linseed, 1000 MG CAPS Take 2,000 mg  by mouth daily.     . fluticasone (FLONASE) 50 MCG/ACT nasal spray Place 1 spray into both nostrils 2 (two) times daily as needed for allergies or rhinitis.    Marland Kitchen gabapentin (NEURONTIN) 300 MG capsule Take 300 mg by mouth 3 (three) times daily as needed (FOR NERVE PAIN).   3  . hydrochlorothiazide 25 MG tablet Take 25 mg by mouth daily.     Marland Kitchen ibuprofen (ADVIL,MOTRIN) 200 MG tablet Take 400-600 mg by mouth 2 (two) times daily as needed (pain).     Marland Kitchen loratadine (CLARITIN) 10 MG tablet Take 10 mg by mouth daily as needed for allergies.    Marland Kitchen losartan (COZAAR) 50 MG tablet Take 50 mg by mouth daily.     Marland Kitchen LOTEMAX 0.5 % ophthalmic suspension Place 1 drop into both eyes 4 (four) times daily. As needed  0  . MAGNESIUM PO Take 1 tablet by mouth daily.    . meclizine (ANTIVERT) 25 MG tablet Take 1 tablet (25 mg total) by mouth 3 (three) times daily as needed for dizziness. 30 tablet 0  . montelukast (SINGULAIR) 10 MG tablet Take 10 mg by mouth daily as needed (seasonal allergies).    . Multiple Vitamin (MULTIVITAMIN WITH MINERALS) TABS Take 1 tablet by mouth daily.    Marland Kitchen omeprazole (PRILOSEC OTC) 20 MG tablet Take 20 mg by mouth daily.     Marland Kitchen oxyCODONE (ROXICODONE) 5 MG immediate release tablet Take 1 tablet (5 mg total) by mouth every 4 (four) hours as needed for severe pain. 30 tablet 0  . PREVIDENT 5000 DRY MOUTH 1.1 % GEL dental gel USE ONCE DAILY IN PLACE OF REGULAR TOOTHEPASTE  2  . Probiotic Product (ALIGN PO) Take 1 capsule by mouth daily.     . simvastatin (ZOCOR) 40 MG tablet Take 40 mg by mouth daily.     Marland Kitchen zolpidem (AMBIEN) 10 MG tablet TAKE 1/2 TABLET BY MOUTH AT BEDTIME AS NEEDED FOR SLEEP 20 tablet 3  .  KLOR-CON M20 20 MEQ tablet TAKE 1 TABLET BY MOUTH EVERY DAY 30 tablet 0  . clobetasol ointment (TEMOVATE) 0.05 % Apply topically as needed. APPLY TO AFFECTED AREA TWICE A DAY (Patient not taking: Reported on 07/16/2015) 30 g 0  . diazepam (VALIUM) 5 MG tablet Take 1 tablet (5 mg total) by mouth at bedtime. (Patient not taking: Reported on 07/16/2015) 30 tablet 0  . lidocaine (XYLOCAINE) 5 % ointment Apply topically 3 (three) times daily as needed. (Patient not taking: Reported on 07/16/2015) 35.44 g 0  . metoprolol succinate (TOPROL-XL) 50 MG 24 hr tablet Take 2 tablets (100 mg total) by mouth daily. OK TAKE EXTRA 1/2 TAB FOR BREAKTHROUGH PALPITATIONS (Patient not taking: Reported on 07/16/2015)    . nystatin (MYCOSTATIN) 100000 UNIT/ML suspension Take 5 mLs (500,000 Units total) by mouth 4 (four) times daily. (Patient not taking: Reported on 07/16/2015) 60 mL 0  . nystatin cream (MYCOSTATIN) Apply 1 application topically 2 (two) times daily. Apply to affected area BID for up to 7 days. (Patient not taking: Reported on 07/16/2015) 30 g 0  . phenazopyridine (PYRIDIUM) 200 MG tablet Take 1 tablet (200 mg total) by mouth 3 (three) times daily as needed for pain. (Patient not taking: Reported on 07/16/2015) 20 tablet 0  . potassium chloride SA (KLOR-CON M20) 20 MEQ tablet TAKE 20 MEQ BY MOUTH DAILY. TAKE 20 MEQ AND 10 MEQ TO = 30 MEQ DAILY  Please schedule appointment for refills. (Patient not taking: Reported  on 07/16/2015) 30 tablet 0  . valACYclovir (VALTREX) 500 MG tablet Increase to bid x 3 day with outbreak (Patient not taking: Reported on 07/16/2015) 90 tablet 5   No facility-administered medications prior to visit.      Allergies:   Hydrocodone-acetaminophen; Hydrocodone; and Tramadol   Social History   Social History  . Marital Status: Divorced    Spouse Name: N/A  . Number of Children: 2  . Years of Education: N/A   Occupational History  . teacher    Social History Main Topics  . Smoking  status: Former Smoker -- 0.25 packs/day for 2 years    Types: Cigarettes    Quit date: 02/04/1976  . Smokeless tobacco: Never Used  . Alcohol Use: Yes     Comment: occasional  . Drug Use: No  . Sexual Activity: Yes    Birth Control/ Protection: Post-menopausal   Other Topics Concern  . None   Social History Narrative     Family History:  The patient's family history includes Cirrhosis in her father; Dementia in her mother; Leukemia in her child.   ROS:   Please see the history of present illness.    Review of Systems  Cardiovascular: Positive for irregular heartbeat.  Hematologic/Lymphatic: Bruises/bleeds easily.  Musculoskeletal: Positive for joint swelling.   All other systems reviewed and are negative.   Physical Exam:    VS:  BP 150/70 mmHg  Pulse 66  Ht 5\' 4"  (1.626 m)  Wt 163 lb 12.8 oz (74.299 kg)  BMI 28.10 kg/m2   Physical Exam  Constitutional: She is oriented to person, place, and time. She appears well-developed and well-nourished.  HENT:  Head: Normocephalic and atraumatic.  Neck: Normal range of motion. No JVD present.  Cardiovascular: Regular rhythm, S1 normal and S2 normal.   No murmur heard. Pulmonary/Chest: Breath sounds normal. She has no wheezes. She has no rhonchi. She has no rales.  Abdominal: Soft. There is no tenderness.  Musculoskeletal: She exhibits no edema.  Neurological: She is alert and oriented to person, place, and time.  Skin: Skin is warm and dry.  Psychiatric: She has a normal mood and affect.    Wt Readings from Last 3 Encounters:  07/16/15 163 lb 12.8 oz (74.299 kg)  03/26/15 170 lb 3.1 oz (77.2 kg)  03/21/15 159 lb 3.2 oz (72.213 kg)      Studies/Labs Reviewed:     EKG:  EKG is   ordered today.  The ekg ordered today demonstrates NSR, HR 66, LAD, QTc 404 ms, no change from prior tracing   Recent Labs: 09/09/2014: ALT 28 03/21/2015: BUN 21*; Creatinine, Ser 1.14*; Hemoglobin 14.5; Platelets 233; Potassium 4.3; Sodium 142     Recent Lipid Panel    Component Value Date/Time   CHOL 192 05/26/2011 1245   TRIG 122 05/26/2011 1245   HDL 65 05/26/2011 1245   CHOLHDL 3.0 05/26/2011 1245   VLDL 24 05/26/2011 1245   LDLCALC 103* 05/26/2011 1245    Additional studies/ records that were reviewed today include:   None    ASSESSMENT:     1. PVC's (premature ventricular contractions)   2. Palpitations   3. Essential hypertension   4. Hyperlipidemia     PLAN:     In order of problems listed above:  1. PVC - She has symptomatic PVCs.  She generally feels better with K+ ~ 4.  She has been taking K+ 30 mEq QD with good results.  Continue beta-blocker, potassium supplementation.  2. Palpitations - She has noted a change in her palpitations recently.  She has also noted higher than normal BP. She has a hx of TIA in 2013.   I will adjust her beta-blocker by increasing Toprol-XL to 75 mg bid.  If her palpitations continue, she will call.  I would arrange a 30 day event monitor at that point as it would be important to r/o AFib.  We could add Metoprolol Tartrate 12.5 mg bid prn as well if palpitations continue   3. HTN - BP above target.  Increase Toprol to 75 mg bid.    4. HL - Continue Simvastatin.    Medication Adjustments/Labs and Tests Ordered: Current medicines are reviewed at length with the patient today.  Concerns regarding medicines are outlined above.  Medication changes, Labs and Tests ordered today are outlined in the Patient Instructions noted below. Patient Instructions  Medication Instructions:  START TAKING TOPROL XL 75 MG TWICE A DAY  START TAKING POTASSIUM 30 MEQ  ONCE A DAY  (A TABLET AND HALF OF 20 MEQ TABLET )  If you need a refill on your cardiac medications before your next appointment, please call your pharmacy. Labwork: NONE ORDER TODAY  Testing/Procedures: NONE ORDER TODAY Follow-Up:  DR Martinique IN  OR [place] IN 6 MONTHS A LETTER WILL COME TO  YOUR HOME  IN 2 MONTHS  BEFORE TIME TO   REMIND YOU TO CONTACT OFFICE FOR APPOINTMENT Any Other Special Instructions Will Be Listed Below (If Applicable). MONITOR BP CONTACT OFFICE IF BLOOD PRESSURE CONSTANTLY OVER  140/90  IF STILL HAVING PALPITATIONS  CONTACT OFFICE WILL SCHEDULED FOR A MONITOR   Signed, Richardson Dopp, PA-C  07/16/2015 4:25 PM    [facility] [place], [place], [place]  [address] Phone: [phone]; Fax: [phone]

## 2015-07-16 ENCOUNTER — Encounter: Payer: Self-pay | Admitting: Physician Assistant

## 2015-07-16 ENCOUNTER — Ambulatory Visit (INDEPENDENT_AMBULATORY_CARE_PROVIDER_SITE_OTHER): Payer: BC Managed Care – PPO | Admitting: Physician Assistant

## 2015-07-16 VITALS — BP 150/70 | HR 66 | Ht 64.0 in | Wt 163.8 lb

## 2015-07-16 DIAGNOSIS — E785 Hyperlipidemia, unspecified: Secondary | ICD-10-CM

## 2015-07-16 DIAGNOSIS — I10 Essential (primary) hypertension: Secondary | ICD-10-CM | POA: Diagnosis not present

## 2015-07-16 DIAGNOSIS — I493 Ventricular premature depolarization: Secondary | ICD-10-CM | POA: Diagnosis not present

## 2015-07-16 DIAGNOSIS — R002 Palpitations: Secondary | ICD-10-CM | POA: Diagnosis not present

## 2015-07-16 MED ORDER — POTASSIUM CHLORIDE CRYS ER 20 MEQ PO TBCR: 30.0000 meq | EXTENDED_RELEASE_TABLET | Freq: Every day | ORAL | Status: DC

## 2015-07-16 MED ORDER — METOPROLOL SUCCINATE ER 50 MG PO TB24: ORAL_TABLET | ORAL | Status: DC

## 2015-07-16 NOTE — Patient Instructions (Addendum)
Medication Instructions:  START TAKING TOPROL XL 75 MG TWICE A DAY  START TAKING POTASSIUM 30 MEQ  ONCE A DAY  (A TABLET AND HALF OF 20 MEQ TABLET )  If you need a refill on your cardiac medications before your next appointment, please call your pharmacy. Labwork: NONE ORDER TODAY  Testing/Procedures: NONE ORDER TODAY Follow-Up:  DR Martinique IN  OR [place] IN 6 MONTHS A LETTER WILL COME TO  YOUR HOME  IN 2 MONTHS  BEFORE TIME TO  REMIND YOU TO CONTACT OFFICE FOR APPOINTMENT Any Other Special Instructions Will Be Listed Below (If Applicable). MONITOR BP CONTACT OFFICE IF BLOOD PRESSURE CONSTANTLY OVER  140/90  IF STILL HAVING PALPITATIONS  CONTACT OFFICE WILL SCHEDULED FOR A MONITOR

## 2015-07-18 ENCOUNTER — Other Ambulatory Visit: Payer: Self-pay | Admitting: Nurse Practitioner

## 2015-07-18 NOTE — Telephone Encounter (Signed)
Medication refill request: Valacyclovir 500mg  Last AEX:  See EPIC Next AEX: none Last MMG (if hormonal medication request): see EPIC Refill authorized: see EPIC, looks like this was not prescribed here? Please advise

## 2015-07-18 NOTE — Telephone Encounter (Signed)
Needs AEX soon

## 2015-07-19 NOTE — Telephone Encounter (Signed)
Tried calling patient to schedule AEX per PG. Patient did not answer, left message to call office back.

## 2015-07-19 NOTE — Telephone Encounter (Signed)
Spoke with patient, she states that she will have to call back and schedule AEX, she has to check her schedule first before scheduling. Closing this encounter

## 2015-09-03 ENCOUNTER — Encounter (INDEPENDENT_AMBULATORY_CARE_PROVIDER_SITE_OTHER): Payer: Self-pay

## 2015-09-03 ENCOUNTER — Encounter: Payer: Self-pay | Admitting: Cardiology

## 2015-09-03 ENCOUNTER — Ambulatory Visit (INDEPENDENT_AMBULATORY_CARE_PROVIDER_SITE_OTHER): Payer: BC Managed Care – PPO | Admitting: Cardiology

## 2015-09-03 VITALS — BP 150/72 | HR 67 | Ht 64.0 in | Wt 161.8 lb

## 2015-09-03 DIAGNOSIS — I10 Essential (primary) hypertension: Secondary | ICD-10-CM | POA: Diagnosis not present

## 2015-09-03 DIAGNOSIS — R002 Palpitations: Secondary | ICD-10-CM | POA: Diagnosis not present

## 2015-09-03 MED ORDER — LOSARTAN POTASSIUM 100 MG PO TABS: 100.0000 mg | ORAL_TABLET | Freq: Every day | ORAL | 11 refills | Status: DC

## 2015-09-03 NOTE — Progress Notes (Signed)
09/03/2015 [place]   11-Jun-1950  [id number]  Primary Physician [name] Primary Cardiologist: Dr. Martinique   Reason for Visit/CC: HTN; palpitations  HPI:  Cynthia Peters is a 65 y.o. female with a hx of palpitations, HTN, HL, GERD. Event monitor in 2007 demonstrated rare PVCs. ETT-Echo in 2010 was normal. She was evaluated by Dr. Peter Martinique in 08/2010 for palpitations. Symptoms were felt to be due to PVCs and prn beta blocker was recommended. She was admitted with a TIA in 05/2011. Echo (05/2011): Mild LVH, EF 65-70%, no WMA, Gr 1 DD, mild AI.Carotid US (05/2011): No ICA stenosis.  She was recently seen by Richardson Dopp, PA-C 07/16/2015 for routine follow-up. She was also in need of refills of her medications. She complained of increased recurrence of palpitations. She noted her palpitations have felt a little bit more rapid and harder recently. She denied any exertional chest pain dyspnea, orthopnea, PND or edema. She denied syncope. The pressure was noted to be slightly higher at that visit. Given increased blood pressure and increased recurrence of palpitations, it was elected to increase her Toprol-XL to 75 mg a day. She was advised to call our office to set up a 30 day heart monitor if she had continued/worsening palpitations.   After her office visit she was seen by her PCP and was noted to have continued elevated blood pressures. Subsequently her PCP further increased her Toprol-XL 100 mg daily. She was asked to follow up in our clinic for further recommendations.  Blood pressure today remains elevated at 150/72. She reports full medication compliance. She denies any recent excessive intake of sodium, caffeine or alcohol. No increased stress or pain. She does not smoke. She denies daily use of NSAIDs. She also reports that she continues to feel palpitations that occur almost on a daily basis. No chest pain or dyspnea.  Current Outpatient Prescriptions    Medication Sig Dispense Refill  . acetaminophen (TYLENOL) 500 MG tablet Take 1,000 mg by mouth every 6 (six) hours as needed (pain).     Marland Kitchen acetaminophen-codeine (TYLENOL #3) 300-30 MG tablet Take 1 tablet by mouth every 6 (six) hours as needed for moderate pain.     Marland Kitchen albuterol (PROVENTIL HFA;VENTOLIN HFA) 108 (90 BASE) MCG/ACT inhaler Inhale 2 puffs into the lungs every 6 (six) hours as needed for wheezing or shortness of breath.    . Biotin 5000 MCG CAPS Take 1 capsule by mouth daily.    Marland Kitchen buPROPion (WELLBUTRIN XL) 300 MG 24 hr tablet Take 300 mg by mouth daily.     . Calcium Carbonate-Vitamin D (CALTRATE 600+D) 600-400 MG-UNIT per tablet Take 1 tablet by mouth daily.     . Cholecalciferol (VITAMIN D) 2000 units CAPS Take 2,000 Units by mouth daily.    . clobetasol ointment (TEMOVATE) [id number] % Apply 1 application topically 2 (two) times daily as needed (skin irritation).    . Coenzyme Q10 (CO Q 10) 100 MG CAPS Take 100 mg by mouth daily.     . cycloSPORINE (RESTASIS) 0.05 % ophthalmic emulsion Place 1 drop into both eyes 2 (two) times daily.    Marland Kitchen dexamethasone (DECADRON) 0.5 MG/5ML solution TAKE 10 ML BY MOUTH daily as needed  AS A MOUTHRINSE  0  . diazepam (VALIUM) 5 MG tablet Take 2.5-5 mg by mouth every 6 (six) hours as needed for anxiety.   0  . dicyclomine (BENTYL) 10 MG capsule Take 10 mg by mouth every 4 (four) hours as  needed (for abdominal cramps).   1  . Evening Primrose Oil 1000 MG CAPS Take 2,000 mg by mouth daily.     . Flaxseed, Linseed, 1000 MG CAPS Take 2,000 mg by mouth daily.     . fluticasone (FLONASE) 50 MCG/ACT nasal spray Place 1 spray into both nostrils 2 (two) times daily as needed for allergies or rhinitis.    Marland Kitchen gabapentin (NEURONTIN) 300 MG capsule Take 300 mg by mouth 3 (three) times daily as needed (FOR NERVE PAIN).   3  . hydrochlorothiazide 25 MG tablet Take 25 mg by mouth daily.     Marland Kitchen ibuprofen (ADVIL,MOTRIN) 200 MG tablet Take 400-600 mg by mouth 2 (two) times  daily as needed (pain).     Marland Kitchen lidocaine (XYLOCAINE) 5 % ointment Apply 1 application topically 3 (three) times daily as needed for mild pain or moderate pain.    Marland Kitchen loratadine (CLARITIN) 10 MG tablet Take 10 mg by mouth daily as needed for allergies.    Marland Kitchen LOTEMAX 0.5 % ophthalmic suspension Place 1 drop into both eyes 4 (four) times daily. As needed  0  . MAGNESIUM PO Take 1 tablet by mouth daily.    . meclizine (ANTIVERT) 25 MG tablet Take 1 tablet (25 mg total) by mouth 3 (three) times daily as needed for dizziness. 30 tablet 0  . metoprolol succinate (TOPROL-XL) 50 MG 24 hr tablet Take 1 and 1/2 tablets (75 mg) Twice daily (Patient taking differently: Take 50 mg by mouth. Take 2 tablets daily) 90 tablet 11  . montelukast (SINGULAIR) 10 MG tablet Take 10 mg by mouth daily as needed (seasonal allergies).    . Multiple Vitamin (MULTIVITAMIN WITH MINERALS) TABS Take 1 tablet by mouth daily.    Marland Kitchen nystatin (MYCOSTATIN) 100000 UNIT/ML suspension Take 5 mLs by mouth daily as needed (FLARES, LICHEN PLANTIS).    Marland Kitchen nystatin cream (MYCOSTATIN) Apply 1 application topically 2 (two) times daily as needed for dry skin.    Marland Kitchen omeprazole (PRILOSEC OTC) 20 MG tablet Take 20 mg by mouth daily.     Marland Kitchen oxyCODONE (ROXICODONE) 5 MG immediate release tablet Take 1 tablet (5 mg total) by mouth every 4 (four) hours as needed for severe pain. 30 tablet 0  . phenazopyridine (PYRIDIUM) 200 MG tablet Take 200 mg by mouth 3 (three) times daily as needed for pain Stark Jock TRACT PAIN).    Marland Kitchen potassium chloride SA (KLOR-CON M20) 20 MEQ tablet Take 1.5 tablets (30 mEq total) by mouth daily. 45 tablet 11  . PREVIDENT 5000 DRY MOUTH 1.1 % GEL dental gel USE ONCE DAILY IN PLACE OF REGULAR TOOTHEPASTE  2  . Probiotic Product (ALIGN PO) Take 1 capsule by mouth daily.     . simvastatin (ZOCOR) 40 MG tablet Take 40 mg by mouth daily.     . valACYclovir (VALTREX) 500 MG tablet Take 500 mg by mouth 3 (three) times daily as needed  (OUTBREAKS).    . valACYclovir (VALTREX) 500 MG tablet INCREASE TO TAKING 1 TABLET 2 TIMES A DAY FOR 3 DAYS WITH OUTBREAK 90 tablet 0  . zolpidem (AMBIEN) 10 MG tablet TAKE 1/2 TABLET BY MOUTH AT BEDTIME AS NEEDED FOR SLEEP 20 tablet 3   No current facility-administered medications for this visit.     Allergies  Allergen Reactions  . Hydrocodone-Acetaminophen     Other reaction(s): Other (See Comments)  . Hydrocodone Rash  . Tramadol Palpitations    Social History   Social History  .  Marital status: Divorced    Spouse name: N/A  . Number of children: 2  . Years of education: N/A   Occupational History  . teacher    Social History Main Topics  . Smoking status: Former Smoker    Packs/day: 0.25    Years: 2.00    Types: Cigarettes    Quit date: 02/04/1976  . Smokeless tobacco: Never Used  . Alcohol use Yes     Comment: occasional  . Drug use: No  . Sexual activity: Yes    Birth control/ protection: Post-menopausal   Other Topics Concern  . Not on file   Social History Narrative  . No narrative on file     Review of Systems: General: negative for chills, fever, night sweats or weight changes.  Cardiovascular: negative for chest pain, dyspnea on exertion, edema, orthopnea, palpitations, paroxysmal nocturnal dyspnea or shortness of breath Dermatological: negative for rash Respiratory: negative for cough or wheezing Urologic: negative for hematuria Abdominal: negative for nausea, vomiting, diarrhea, bright red blood per rectum, melena, or hematemesis Neurologic: negative for visual changes, syncope, or dizziness All other systems reviewed and are otherwise negative except as noted above.    Blood pressure (!) 150/72, pulse 67, height 5\' 4"  (1.626 m), weight 161 lb 12.8 oz (73.4 kg).  General appearance: alert, cooperative and no distress Neck: no carotid bruit and no JVD Lungs: clear to auscultation bilaterally Heart: regular rate and rhythm, S1, S2 normal, no  murmur, click, rub or gallop Extremities: no LEE Pulses: 2+ and symmetric Skin: warm and dry Neurologic: Grossly normal  EKG not performed.   ASSESSMENT AND PLAN:   1. Hypertension: Pressure remains elevated at 150/72. We will further increase her losartan 100 mg daily. Continue hydrochlorothiazide 25 mg daily. Continue Metroprolol, 100 mg. Check a basic metabolic panel today to assess renal function and potassium levels.  2. Palpitations: Patient with prior h/o symptomatic PVCs, but she has noticed increased frequency/ severity of palpitations.  Order a 48 hour Holter monitor to further evaluate.  BMP to assess potassium levels.  PLAN  F/u in HTN clinic in 1 week to reassess BP and further adjust meds as needed. 48 hr heart monitor for palpitations.   Lyda Jester PA-C 09/03/2015 3:41 PM

## 2015-09-03 NOTE — Patient Instructions (Signed)
Your physician has recommended you make the following change in your medication: the losartan has been increased to 100 mg daily.  Your physician has recommended that you wear a 48 hour holter monitor. Holter monitors are medical devices that record the heart's electrical activity. Doctors most often use these monitors to diagnose arrhythmias. Arrhythmias are problems with the speed or rhythm of the heartbeat. The monitor is a small, portable device. You can wear one while you do your normal daily activities. This is usually used to diagnose what is causing palpitations/syncope (passing out).  Your physician recommends that you return for lab work TODAY.  Your physician recommends that you schedule a follow-up appointment pharmacist for blood pressure when she returns the monitor.

## 2015-09-04 ENCOUNTER — Telehealth: Payer: Self-pay | Admitting: Physician Assistant

## 2015-09-04 LAB — BASIC METABOLIC PANEL
BUN: 23 mg/dL (ref 7–25)
CO2: 25 mmol/L (ref 20–31)
Calcium: 9.6 mg/dL (ref 8.6–10.4)
Chloride: 104 mmol/L (ref 98–110)
Creat: 0.91 mg/dL (ref 0.50–0.99)
Glucose, Bld: 87 mg/dL (ref 65–99)
Potassium: 4.5 mmol/L (ref 3.5–5.3)
Sodium: 141 mmol/L (ref 135–146)

## 2015-09-04 NOTE — Telephone Encounter (Signed)
°  Follow Up   Pt has a question regarding Losartan medication. Please call.

## 2015-09-04 NOTE — Telephone Encounter (Signed)
Pt's Losartan increased to 100mg  at OV yesterday.  Pt wanted to know if she is to take the full 100mg  at one time.  Advised pt to take 100mg  at one time and let us know if she has any side effects with the increased dose.  Pt verbalized understanding and was in agreement with this plan.

## 2015-09-04 NOTE — Telephone Encounter (Signed)
-----   Message from Consuelo Pandy, [place] sent at 09/04/2015  1:34 PM EDT ----- Potassium levels are normal. Renal function normal.

## 2015-09-04 NOTE — Telephone Encounter (Signed)
New message   Pt verbalized she is returning call for jennifer for lab results

## 2015-09-04 NOTE — Telephone Encounter (Signed)
Pt has been made aware of her lab results and verbalized understanding. °

## 2015-09-10 ENCOUNTER — Ambulatory Visit: Payer: BC Managed Care – PPO | Admitting: Physician Assistant

## 2015-09-10 ENCOUNTER — Ambulatory Visit (INDEPENDENT_AMBULATORY_CARE_PROVIDER_SITE_OTHER): Payer: BC Managed Care – PPO

## 2015-09-10 DIAGNOSIS — R002 Palpitations: Secondary | ICD-10-CM

## 2015-09-13 ENCOUNTER — Ambulatory Visit: Payer: BC Managed Care – PPO

## 2015-09-13 ENCOUNTER — Encounter: Payer: Self-pay | Admitting: Cardiology

## 2015-09-17 ENCOUNTER — Ambulatory Visit (INDEPENDENT_AMBULATORY_CARE_PROVIDER_SITE_OTHER): Payer: BC Managed Care – PPO | Admitting: Pharmacist

## 2015-09-17 VITALS — BP 160/90 | HR 62

## 2015-09-17 DIAGNOSIS — I10 Essential (primary) hypertension: Secondary | ICD-10-CM

## 2015-09-17 LAB — BASIC METABOLIC PANEL
BUN: 22 mg/dL (ref 7–25)
CO2: 25 mmol/L (ref 20–31)
Calcium: 9.6 mg/dL (ref 8.6–10.4)
Chloride: 104 mmol/L (ref 98–110)
Creat: 1.34 mg/dL — ABNORMAL HIGH (ref 0.50–0.99)
Glucose, Bld: 104 mg/dL — ABNORMAL HIGH (ref 65–99)
Potassium: 4.6 mmol/L (ref 3.5–5.3)
Sodium: 139 mmol/L (ref 135–146)

## 2015-09-17 NOTE — Progress Notes (Signed)
Patient ID: Cynthia Peters                 DOB: 1950/07/09                      MRN: [id number]     HPI: [name] is a 65 y.o. female referred by Lyda Jester to HTN clinic.  She has a PMH significant for HTN, HLD, GERD, palpitations, TIA, mild LVH with EF of 65-70%. She has been seen several times in the past 2 months for palpitations and elevated BP.  Her metoprolol has been increased from 50mg  to 100mg  and losartan to 100mg  daily.  She is here today for follow up BP check. There is a question about her metoprolol- pt states she is taking twice a day and chart states once daily.  She does not know if she has the succinate or tartrate tablets.   Pt states she has been more lethargic lately.  She has also had some dizziness.  She is a retired Automotive engineer.   Current HTN meds: HCTZ 25mg  daily, losartan 100mg  daily, metoprolol succinate 100mg  daily Previously tried: n/a BP goal: <140/90  Diet: Pt eats out about 1/2 the time and cooks at home the other 1/2.  She likes a lot of high salt foods such as [place], [place] and pizza.  She does use some light salt on her food at home.   Exercise:  Pt has not exercised on a regular basis since she had hernia surgery in February.   Wt Readings from Last 3 Encounters:  09/03/15 161 lb 12.8 oz (73.4 kg)  07/16/15 163 lb 12.8 oz (74.3 kg)  03/26/15 170 lb 3.1 oz (77.2 kg)   BP Readings from Last 3 Encounters:  09/03/15 (!) 150/72  07/16/15 (!) 150/70  06/19/15 (!) 146/83   Pulse Readings from Last 3 Encounters:  09/03/15 67  07/16/15 66  06/19/15 69    Renal function: CrCl cannot be calculated (Unknown ideal weight.).  Past Medical History:  Diagnosis Date  . Arthritis   . Bronchitis   . Cataracts, bilateral    immature  . Claustrophobia    takes Valium daily as needed  . Complication of anesthesia    2013 SURGERY vocal cord bothering pt, used smaller ent tube after 2 hernia repair, no problem  after . " I woke up during my MRI and colonoscopy."       . Depression    takes Wellbutrin daily  . GERD (gastroesophageal reflux disease)    takes Omeprazole daily   . H/O hiatal hernia   . History of blood clots 40 yrs    leg   . History of echocardiogram 2013   a. Echo (05/2011): Mild LVH, EF 65-70%, no WMA, Gr 1 DD, mild AI.  Marland Kitchen History of MRSA infection   . History of staph infection   . History of stress test 2010   a. ETT-Echo in 2010 was normal  . History of TIA (transient ischemic attack) 2013   a. Carotid US (05/2011): No ICA stenosis  . Hyperlipidemia    takes Simvastatin daily   . Hypertension    takes  Losartan daily  . IBS (irritable bowel syndrome)    takes Bentyl daily as needed  . Insomnia    takes Ambien nightly  . Interstitial cystitis   . Lichen planus   . Mild asthma    Albuterol inhaler as needed  . Palpitations  Event monitor in 2007 with rare PVCs // takes Metoprolol daily  . Seasonal allergies    takes Claritin daily as needed.Takes Singulair daily as needed.  Marland Kitchen Urethral stricture   . Vertigo    takes Meclizine daily as needed    Current Outpatient Prescriptions on File Prior to Visit  Medication Sig Dispense Refill  . acetaminophen (TYLENOL) 500 MG tablet Take 1,000 mg by mouth every 6 (six) hours as needed (pain).     Marland Kitchen acetaminophen-codeine (TYLENOL #3) 300-30 MG tablet Take 1 tablet by mouth every 6 (six) hours as needed for moderate pain.     Marland Kitchen albuterol (PROVENTIL HFA;VENTOLIN HFA) 108 (90 BASE) MCG/ACT inhaler Inhale 2 puffs into the lungs every 6 (six) hours as needed for wheezing or shortness of breath.    . Biotin 5000 MCG CAPS Take 1 capsule by mouth daily.    Marland Kitchen buPROPion (WELLBUTRIN XL) 300 MG 24 hr tablet Take 300 mg by mouth daily.     . Calcium Carbonate-Vitamin D (CALTRATE 600+D) 600-400 MG-UNIT per tablet Take 1 tablet by mouth daily.     . Cholecalciferol (VITAMIN D) 2000 units CAPS Take 2,000 Units by mouth daily.    .  clobetasol ointment (TEMOVATE) [id number] % Apply 1 application topically 2 (two) times daily as needed (skin irritation).    . Coenzyme Q10 (CO Q 10) 100 MG CAPS Take 100 mg by mouth daily.     . cycloSPORINE (RESTASIS) 0.05 % ophthalmic emulsion Place 1 drop into both eyes 2 (two) times daily.    Marland Kitchen dexamethasone (DECADRON) 0.5 MG/5ML solution TAKE 10 ML BY MOUTH daily as needed  AS A MOUTHRINSE  0  . diazepam (VALIUM) 5 MG tablet Take 2.5-5 mg by mouth every 6 (six) hours as needed for anxiety.   0  . dicyclomine (BENTYL) 10 MG capsule Take 10 mg by mouth every 4 (four) hours as needed (for abdominal cramps).   1  . Evening Primrose Oil 1000 MG CAPS Take 2,000 mg by mouth daily.     . Flaxseed, Linseed, 1000 MG CAPS Take 2,000 mg by mouth daily.     . fluticasone (FLONASE) 50 MCG/ACT nasal spray Place 1 spray into both nostrils 2 (two) times daily as needed for allergies or rhinitis.    Marland Kitchen gabapentin (NEURONTIN) 300 MG capsule Take 300 mg by mouth 3 (three) times daily as needed (FOR NERVE PAIN).   3  . hydrochlorothiazide 25 MG tablet Take 25 mg by mouth daily.     Marland Kitchen ibuprofen (ADVIL,MOTRIN) 200 MG tablet Take 400-600 mg by mouth 2 (two) times daily as needed (pain).     Marland Kitchen lidocaine (XYLOCAINE) 5 % ointment Apply 1 application topically 3 (three) times daily as needed for mild pain or moderate pain.    Marland Kitchen loratadine (CLARITIN) 10 MG tablet Take 10 mg by mouth daily as needed for allergies.    Marland Kitchen losartan (COZAAR) 100 MG tablet Take 1 tablet (100 mg total) by mouth daily. 30 tablet 11  . LOTEMAX 0.5 % ophthalmic suspension Place 1 drop into both eyes 4 (four) times daily. As needed  0  . MAGNESIUM PO Take 1 tablet by mouth daily.    . meclizine (ANTIVERT) 25 MG tablet Take 1 tablet (25 mg total) by mouth 3 (three) times daily as needed for dizziness. 30 tablet 0  . metoprolol succinate (TOPROL-XL) 50 MG 24 hr tablet Take 1 and 1/2 tablets (75 mg) Twice daily (Patient taking differently: Take  50 mg by  mouth. Take 2 tablets daily) 90 tablet 11  . montelukast (SINGULAIR) 10 MG tablet Take 10 mg by mouth daily as needed (seasonal allergies).    . Multiple Vitamin (MULTIVITAMIN WITH MINERALS) TABS Take 1 tablet by mouth daily.    Marland Kitchen nystatin (MYCOSTATIN) 100000 UNIT/ML suspension Take 5 mLs by mouth daily as needed (FLARES, LICHEN PLANTIS).    Marland Kitchen nystatin cream (MYCOSTATIN) Apply 1 application topically 2 (two) times daily as needed for dry skin.    Marland Kitchen omeprazole (PRILOSEC OTC) 20 MG tablet Take 20 mg by mouth daily.     Marland Kitchen oxyCODONE (ROXICODONE) 5 MG immediate release tablet Take 1 tablet (5 mg total) by mouth every 4 (four) hours as needed for severe pain. 30 tablet 0  . phenazopyridine (PYRIDIUM) 200 MG tablet Take 200 mg by mouth 3 (three) times daily as needed for pain Stark Jock TRACT PAIN).    Marland Kitchen potassium chloride SA (KLOR-CON M20) 20 MEQ tablet Take 1.5 tablets (30 mEq total) by mouth daily. 45 tablet 11  . PREVIDENT 5000 DRY MOUTH 1.1 % GEL dental gel USE ONCE DAILY IN PLACE OF REGULAR TOOTHEPASTE  2  . Probiotic Product (ALIGN PO) Take 1 capsule by mouth daily.     . simvastatin (ZOCOR) 40 MG tablet Take 40 mg by mouth daily.     . valACYclovir (VALTREX) 500 MG tablet Take 500 mg by mouth 3 (three) times daily as needed (OUTBREAKS).    . valACYclovir (VALTREX) 500 MG tablet INCREASE TO TAKING 1 TABLET 2 TIMES A DAY FOR 3 DAYS WITH OUTBREAK 90 tablet 0  . zolpidem (AMBIEN) 10 MG tablet TAKE 1/2 TABLET BY MOUTH AT BEDTIME AS NEEDED FOR SLEEP 20 tablet 3   No current facility-administered medications on file prior to visit.     Allergies  Allergen Reactions  . Hydrocodone-Acetaminophen     Other reaction(s): Other (See Comments)  . Hydrocodone Rash  . Tramadol Palpitations     Assessment/Plan: 1.  Hypertension- Pt's BP elevated in office today but she admits she is quite nervous.  We are unsure what dose of metoprolol she is taking.  Given these concerns, will have her monitor her BP at  home over the next 2 weeks and send the readings to me.  We will see what she has been averaging so then we can make appropriate adjustments.

## 2015-09-17 NOTE — Patient Instructions (Addendum)
Start checking your blood pressure at home.  If you need a new cuff, we suggest Omiron brand.   Send in the readings next week to Elberta Leatherwood, Pharmacist.  Once I have the readings we can make a decision about what to do with your medications.   If you have any questions, please call Gay Filler at [phone]

## 2015-09-20 ENCOUNTER — Encounter: Payer: Self-pay | Admitting: Pharmacist

## 2015-09-26 ENCOUNTER — Telehealth: Payer: Self-pay | Admitting: Cardiology

## 2015-09-26 DIAGNOSIS — Z79899 Other long term (current) drug therapy: Secondary | ICD-10-CM

## 2015-09-26 NOTE — Telephone Encounter (Signed)
New Message  Pt is returning nurses call about lab results.  Please follow up with pt. Thanks!

## 2015-09-26 NOTE — Telephone Encounter (Signed)
holter and lab Results communicated, repeat lab orders placed. Pt aware. See results notes.

## 2015-10-10 ENCOUNTER — Other Ambulatory Visit: Payer: Self-pay | Admitting: Nurse Practitioner

## 2015-10-10 NOTE — Telephone Encounter (Signed)
Medication refill request: valACYclovir 500mg  Last AEX:  10/20/14 office visit for vulvar irritaiton Next AEX: none scheduled Last MMG (if hormonal medication request): See EPIC Refill authorized: Please advise

## 2015-11-15 ENCOUNTER — Encounter: Payer: Self-pay | Admitting: Podiatry

## 2015-11-15 ENCOUNTER — Ambulatory Visit (INDEPENDENT_AMBULATORY_CARE_PROVIDER_SITE_OTHER): Payer: BC Managed Care – PPO

## 2015-11-15 ENCOUNTER — Ambulatory Visit (INDEPENDENT_AMBULATORY_CARE_PROVIDER_SITE_OTHER): Payer: BC Managed Care – PPO | Admitting: Podiatry

## 2015-11-15 DIAGNOSIS — M722 Plantar fascial fibromatosis: Secondary | ICD-10-CM

## 2015-11-15 MED ORDER — MELOXICAM 15 MG PO TABS: 15.0000 mg | ORAL_TABLET | Freq: Every day | ORAL | 3 refills | Status: DC

## 2015-11-15 MED ORDER — METHYLPREDNISOLONE 4 MG PO TBPK: ORAL_TABLET | ORAL | 0 refills | Status: DC

## 2015-11-15 NOTE — Patient Instructions (Signed)

## 2015-11-15 NOTE — Progress Notes (Signed)
She presents today with chief complaint of pain to the medial plantar foot and heel states it up and having pain in his right arch for the past few weeks. She states it radiates up her leg. Denies any trauma.  Objective: Vital signs are stable alert and oriented 3. Pulses are palpable. Neurologic sensorium is intact. Degenerative reflexes are intact. She is pain on palpation Mico conjugal the right arch. She also has pain on palpation of the right midfoot and dorsal lateral foot. Radiographs taken today do demonstrate soft tissue increase in density of the plantar fascia calcaneal insertion site consisting of plantar fasciitis.  Assessment: Plantar fascitis right foot. Lateral compensatory syndrome.  Plan: Discussed etiology pathology conservative versus surgical therapies. Discussed appropriate shoe gear stretching exercises ice therapy and shoe gear modifications. I injected the right heel today with Kenalog and local anesthetic placed her into a plantar fascia brace to be followed by a night splint. Started her on a Medrol Dosepak to be followed by meloxicam and I will follow-up with her in 1 month.

## 2015-11-16 ENCOUNTER — Telehealth: Payer: Self-pay | Admitting: *Deleted

## 2015-11-16 NOTE — Telephone Encounter (Addendum)
Pt states she received a day brace and a night brace and she doesn't feel she knows how to put either on properly.  I told pt I could help her with both if she could come to the office. Pt states she will be here in about 30 mins. Pt presented for instruction of plantar fascial brace and nigh splint.  Pt states her foot is size 9, but the plantar fascial brace appears too big although the large/medium is the proper size for the 9. I should pt to apply the plantar fascial brace as the manufacture instructed with the "sun" decal and arch in the back and to put the night splint on by bending the knee of the right leg which forced the heel into the angle of the splint and fastening the middle strap snuggly 1st then the remaining straps this would hold the heel firmer in the splint. Pt states understanding.

## 2015-11-19 ENCOUNTER — Encounter: Payer: Self-pay | Admitting: Physician Assistant

## 2015-11-22 ENCOUNTER — Other Ambulatory Visit: Payer: Self-pay | Admitting: *Deleted

## 2015-11-22 DIAGNOSIS — I493 Ventricular premature depolarization: Secondary | ICD-10-CM

## 2015-11-22 DIAGNOSIS — I10 Essential (primary) hypertension: Secondary | ICD-10-CM

## 2015-11-22 MED ORDER — METOPROLOL SUCCINATE ER 100 MG PO TB24: 100.0000 mg | ORAL_TABLET | Freq: Two times a day (BID) | ORAL | 3 refills | Status: DC

## 2015-12-18 ENCOUNTER — Encounter: Payer: Self-pay | Admitting: Podiatry

## 2015-12-18 ENCOUNTER — Ambulatory Visit (INDEPENDENT_AMBULATORY_CARE_PROVIDER_SITE_OTHER): Payer: BC Managed Care – PPO | Admitting: Podiatry

## 2015-12-18 DIAGNOSIS — M722 Plantar fascial fibromatosis: Secondary | ICD-10-CM | POA: Diagnosis not present

## 2015-12-19 NOTE — Progress Notes (Signed)
She presents today for follow-up of her plantar fasciitis of the right heel. She states that she had really hasn't had any pain and she is doing very well. She is continuing conservative therapies including medication and his night splint and strapping.  Objective: Vital signs are stable she is alert and oriented 3 no reproducible pain today. Pulses remain palpable pain. No open lesions or wounds.  Assessment: Plantar fasciitis is resolving.  Plan: Continue all conservative therapies make an appointment for 1 month should she start to regress she will notify us immediately. If she's doing better in 1 month she can cancel the appointment.

## 2016-01-15 ENCOUNTER — Ambulatory Visit: Payer: BC Managed Care – PPO | Admitting: Podiatry

## 2016-03-12 ENCOUNTER — Emergency Department (HOSPITAL_BASED_OUTPATIENT_CLINIC_OR_DEPARTMENT_OTHER)
Admission: EM | Admit: 2016-03-12 | Discharge: 2016-03-12 | Disposition: A | Discharge status: Home / Self Care | Payer: BC Managed Care – PPO | Attending: Emergency Medicine | Admitting: Emergency Medicine

## 2016-03-12 ENCOUNTER — Emergency Department (HOSPITAL_BASED_OUTPATIENT_CLINIC_OR_DEPARTMENT_OTHER): Payer: BC Managed Care – PPO

## 2016-03-12 ENCOUNTER — Encounter (HOSPITAL_BASED_OUTPATIENT_CLINIC_OR_DEPARTMENT_OTHER): Payer: Self-pay | Admitting: *Deleted

## 2016-03-12 DIAGNOSIS — Z87891 Personal history of nicotine dependence: Secondary | ICD-10-CM | POA: Insufficient documentation

## 2016-03-12 DIAGNOSIS — Z791 Long term (current) use of non-steroidal anti-inflammatories (NSAID): Secondary | ICD-10-CM | POA: Insufficient documentation

## 2016-03-12 DIAGNOSIS — Z79899 Other long term (current) drug therapy: Secondary | ICD-10-CM | POA: Diagnosis not present

## 2016-03-12 DIAGNOSIS — I10 Essential (primary) hypertension: Secondary | ICD-10-CM | POA: Insufficient documentation

## 2016-03-12 DIAGNOSIS — R109 Unspecified abdominal pain: Secondary | ICD-10-CM

## 2016-03-12 DIAGNOSIS — R1084 Generalized abdominal pain: Secondary | ICD-10-CM | POA: Diagnosis present

## 2016-03-12 DIAGNOSIS — N390 Urinary tract infection, site not specified: Secondary | ICD-10-CM | POA: Diagnosis not present

## 2016-03-12 LAB — CBC WITH DIFFERENTIAL/PLATELET
Basophils Absolute: 0 10*3/uL (ref 0.0–0.1)
Basophils Relative: 0 %
Eosinophils Absolute: 0.5 10*3/uL (ref 0.0–0.7)
Eosinophils Relative: 4 %
HCT: 38.9 % (ref 36.0–46.0)
Hemoglobin: 13.6 g/dL (ref 12.0–15.0)
Lymphocytes Relative: 15 %
Lymphs Abs: 1.8 10*3/uL (ref 0.7–4.0)
MCH: 33.1 pg (ref 26.0–34.0)
MCHC: 35 g/dL (ref 30.0–36.0)
MCV: 94.6 fL (ref 78.0–100.0)
Monocytes Absolute: 1.3 10*3/uL — ABNORMAL HIGH (ref 0.1–1.0)
Monocytes Relative: 11 %
Neutro Abs: 8.8 10*3/uL — ABNORMAL HIGH (ref 1.7–7.7)
Neutrophils Relative %: 70 %
Platelets: 220 10*3/uL (ref 150–400)
RBC: 4.11 MIL/uL (ref 3.87–5.11)
RDW: 12.3 % (ref 11.5–15.5)
WBC: 12.4 10*3/uL — ABNORMAL HIGH (ref 4.0–10.5)

## 2016-03-12 LAB — URINALYSIS, ROUTINE W REFLEX MICROSCOPIC
Bilirubin Urine: NEGATIVE
Glucose, UA: NEGATIVE mg/dL
Hgb urine dipstick: NEGATIVE
Ketones, ur: 15 mg/dL — AB
Nitrite: POSITIVE — AB
Protein, ur: NEGATIVE mg/dL
Specific Gravity, Urine: 1.023 (ref 1.005–1.030)
pH: 5.5 (ref 5.0–8.0)

## 2016-03-12 LAB — COMPREHENSIVE METABOLIC PANEL
ALT: 24 U/L (ref 14–54)
AST: 28 U/L (ref 15–41)
Albumin: 4.2 g/dL (ref 3.5–5.0)
Alkaline Phosphatase: 56 U/L (ref 38–126)
Anion gap: 8 (ref 5–15)
BUN: 24 mg/dL — ABNORMAL HIGH (ref 6–20)
CO2: 28 mmol/L (ref 22–32)
Calcium: 9.6 mg/dL (ref 8.9–10.3)
Chloride: 100 mmol/L — ABNORMAL LOW (ref 101–111)
Creatinine, Ser: 0.89 mg/dL (ref 0.44–1.00)
GFR calc Af Amer: >60 mL/min (ref >60)
GFR calc non Af Amer: >60 mL/min (ref >60)
Glucose, Bld: 120 mg/dL — ABNORMAL HIGH (ref 65–99)
Potassium: 3.6 mmol/L (ref 3.5–5.1)
Sodium: 136 mmol/L (ref 135–145)
Total Bilirubin: 1.3 mg/dL — ABNORMAL HIGH (ref 0.3–1.2)
Total Protein: 6.4 g/dL — ABNORMAL LOW (ref 6.5–8.1)

## 2016-03-12 LAB — URINALYSIS, MICROSCOPIC (REFLEX)

## 2016-03-12 LAB — LIPASE, BLOOD: Lipase: 19 U/L (ref 11–51)

## 2016-03-12 LAB — I-STAT CG4 LACTIC ACID, ED: Lactic Acid, Venous: 0.6 mmol/L (ref 0.5–1.9)

## 2016-03-12 MED ORDER — ONDANSETRON HCL 4 MG PO TABS: 4.0000 mg | ORAL_TABLET | Freq: Four times a day (QID) | ORAL | 0 refills | Status: DC

## 2016-03-12 MED ORDER — DEXTROSE 5 % IV SOLN
1.0000 g | Freq: Once | INTRAVENOUS | Status: AC
  Administered 2016-03-12: 1 g via INTRAVENOUS
  Filled 2016-03-12: qty 10

## 2016-03-12 MED ORDER — IOPAMIDOL (ISOVUE-300) INJECTION 61%
100.0000 mL | Freq: Once | INTRAVENOUS | Status: AC | PRN
  Administered 2016-03-12: 100 mL via INTRAVENOUS

## 2016-03-12 MED ORDER — ONDANSETRON HCL 4 MG/2ML IJ SOLN: 4.0000 mg | Freq: Once | INTRAMUSCULAR | Status: DC

## 2016-03-12 MED ORDER — CEPHALEXIN 500 MG PO CAPS: 500.0000 mg | ORAL_CAPSULE | Freq: Four times a day (QID) | ORAL | 0 refills | Status: DC

## 2016-03-12 MED ORDER — FENTANYL CITRATE (PF) 100 MCG/2ML IJ SOLN: 50.0000 ug | Freq: Once | INTRAMUSCULAR | Status: DC

## 2016-03-12 NOTE — ED Triage Notes (Signed)
Pt with generalized abd pain x 3 hours tenderness to right upper abd pain. States that pain goes in through  to her back for the last hour. denies N/V/D or fevers reports some urinary sx consistent with IC and  took a pyridium. LNBM last Pm

## 2016-03-12 NOTE — ED Notes (Signed)
Patient transported to CT 

## 2016-03-12 NOTE — ED Provider Notes (Signed)
[facility] DEPT [facility] Provider Note   CSN: [id number] Arrival date & time: 03/12/16  0348     History   Chief Complaint Chief Complaint  Patient presents with  . Abdominal Pain    HPI Cynthia Peters is a 66 y.o. female.  Patient presents with 3 hours of generalized abdominal pain that feels like "twisting". This pain is similar to when she had a ventral hernia repair last year. She's had multiple hernia repairs with mesh. Still has a gallbladder and appendix. She denies any fever, chills, nausea or vomiting. No diarrhea. Has some dysuria which is consistent with her history of interstitial cystitis. Denies any change with this. The pain radiates to her lower back. Denies chest pain or shortness of breath. Denies localization of pain to right upper quadrant and the right lower quadrant. The pain is better with rest and worse with movement.   The history is provided by the patient.    Past Medical History:  Diagnosis Date  . Arthritis   . Bronchitis   . Cataracts, bilateral    immature  . Claustrophobia    takes Valium daily as needed  . Complication of anesthesia    2013 SURGERY vocal cord bothering pt, used smaller ent tube after 2 hernia repair, no problem after . " I woke up during my MRI and colonoscopy."       . Depression    takes Wellbutrin daily  . GERD (gastroesophageal reflux disease)    takes Omeprazole daily   . H/O hiatal hernia   . History of blood clots 40 yrs    leg   . History of echocardiogram 2013   a. Echo (05/2011): Mild LVH, EF 65-70%, no WMA, Gr 1 DD, mild AI.  Marland Kitchen History of MRSA infection   . History of staph infection   . History of stress test 2010   a. ETT-Echo in 2010 was normal  . History of TIA (transient ischemic attack) 2013   a. Carotid US (05/2011): No ICA stenosis  . Hyperlipidemia    takes Simvastatin daily   . Hypertension    takes  Losartan daily  . IBS (irritable bowel syndrome)    takes Bentyl daily as needed  .  Insomnia    takes Ambien nightly  . Interstitial cystitis   . Lichen planus   . Mild asthma    Albuterol inhaler as needed  . Palpitations    Event monitor in 2007 with rare PVCs // takes Metoprolol daily  . Seasonal allergies    takes Claritin daily as needed.Takes Singulair daily as needed.  Marland Kitchen Urethral stricture   . Vertigo    takes Meclizine daily as needed    Patient Active Problem List   Diagnosis Date Noted  . S/P hernia repair 03/26/2015  . S/P repair of ventral hernia 10/25/2014  . Abdominal hernia 10/20/2014  . Anxiety 10/20/2014  . 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 Interstitial cystitis 03/15/2013  . Lichen planus [id number]  . TIA (transient ischemic attack) 05/27/2011  . Hypokalemia 05/26/2011  . PVC's (premature ventricular contractions)   . Hypertension   . Hyperlipidemia     Past Surgical History:  Procedure Laterality Date  . CESAREAN SECTION    . COLONOSCOPY WITH PROPOFOL N/A 08/10/2012   Procedure: COLONOSCOPY WITH PROPOFOL;  Surgeon: Garlan Fair, MD;  Location: [facility];  Service: Endoscopy;  Laterality: N/A;  . CYSTOSCOPY WITH URETHRAL DILATATION N/A 02/18/2013   Procedure: CYSTOSCOPY WITH URETHRAL DILATATION ( NO BALLOON)  AND HYDRODISTENSION;  Surgeon: Alexis Frock, MD;  Location: [facility];  Service: Urology;  Laterality: N/A;  . ENDOMETRIAL ABLATION W/ HYDROTHERMABLATOR  09-27-2002  . INCISIONAL HERNIA REPAIR N/A 10/25/2014   Procedure: HERNIA REPAIR INCISIONAL;  Surgeon: Ralene Ok, MD;  Location: [facility];  Service: General;  Laterality: N/A;  . INCISIONAL HERNIA REPAIR N/A 03/26/2015   Procedure: LAPAROSCOPIC INCISIONAL HERNIA;  Surgeon: Ralene Ok, MD;  Location: [place];  Service: General;  Laterality: N/A;  . INSERTION OF MESH N/A 10/25/2014   Procedure: INSERTION OF MESH;  Surgeon: Ralene Ok, MD;  Location: [place];  Service: General;  Laterality: N/A;  . LAPAROSCOPIC LYSIS OF ADHESIONS N/A 03/26/2015   Procedure:  LAPAROSCOPIC LYSIS OF ADHESIONS;  Surgeon: Ralene Ok, MD;  Location: [place];  Service: General;  Laterality: N/A;  . LAPAROTOMY N/A 10/25/2014   Procedure: EXPLORATORY LAPAROTOMY;  Surgeon: Ralene Ok, MD;  Location: [place];  Service: General;  Laterality: N/A;  . LYSIS OF ADHESION N/A 10/25/2014   Procedure: LYSIS OF ADHESIONS ;  Surgeon: Ralene Ok, MD;  Location: [place];  Service: General;  Laterality: N/A;  . MUCOSAL ADVANCEMENT FLAP N/A 10/25/2014   Procedure: MUCOSAL ADVANCEMENT FLAP;  Surgeon: Ralene Ok, MD;  Location: [place];  Service: General;  Laterality: N/A;  . TRANSTHORACIC ECHOCARDIOGRAM  05-26-2011   MILD LVH/  EF [id number]  GRADE I DIASTOLIC DYSFUNCTION/  MILD AR//   06-20-2008  NOMRAL STRESS ECHO  . UMBILICAL HERNIA REPAIR  2013   AND REVISION THE SAME YEAR W/ MESH  . WISDOM TOOTH EXTRACTION  AGE 28    OB History    Gravida Para Term Preterm AB Living   3 3 3     2    SAB TAB Ectopic Multiple Live Births                   Home Medications    Prior to Admission medications   Medication Sig Start Date End Date Taking? Authorizing Provider  acetaminophen (TYLENOL) 500 MG tablet Take 1,000 mg by mouth every 6 (six) hours as needed (pain).     Historical Provider, MD  albuterol (PROVENTIL HFA;VENTOLIN HFA) 108 (90 BASE) MCG/ACT inhaler Inhale 2 puffs into the lungs every 6 (six) hours as needed for wheezing or shortness of breath.    Historical Provider, MD  Biotin 5000 MCG CAPS Take 1 capsule by mouth daily.    Historical Provider, MD  buPROPion (WELLBUTRIN XL) 300 MG 24 hr tablet Take 300 mg by mouth daily.     Historical Provider, MD  Calcium Carbonate-Vitamin D (CALTRATE 600+D) 600-400 MG-UNIT per tablet Take 1 tablet by mouth daily.     Historical Provider, MD  Cholecalciferol (VITAMIN D) 2000 units CAPS Take 2,000 Units by mouth daily.    Historical Provider, MD  clobetasol ointment (TEMOVATE) [id number] % Apply 1 application topically 2 (two) times daily as  needed (skin irritation).    Historical Provider, MD  Coenzyme Q10 (CO Q 10) 100 MG CAPS Take 100 mg by mouth daily.     Historical Provider, MD  cycloSPORINE (RESTASIS) 0.05 % ophthalmic emulsion Place 1 drop into both eyes 2 (two) times daily.    Historical Provider, MD  dexamethasone (DECADRON) 0.5 MG/5ML solution TAKE 10 ML BY MOUTH daily as needed  AS A MOUTHRINSE 09/01/14   Historical Provider, MD  diazepam (VALIUM) 5 MG tablet Take 2.5-5 mg by mouth every 6 (six) hours as needed for anxiety.  08/22/14   Historical Provider, MD  dicyclomine (BENTYL) 10 MG capsule Take 10 mg by mouth every 4 (four) hours as needed (for abdominal cramps).  02/13/15   Historical Provider, MD  Evening Primrose Oil 1000 MG CAPS Take 2,000 mg by mouth daily.     Historical Provider, MD  Flaxseed, Linseed, 1000 MG CAPS Take 2,000 mg by mouth daily.     Historical Provider, MD  fluticasone (FLONASE) 50 MCG/ACT nasal spray Place 1 spray into both nostrils 2 (two) times daily as needed for allergies or rhinitis.    Historical Provider, MD  hydrochlorothiazide 25 MG tablet Take 25 mg by mouth daily.     Historical Provider, MD  ibuprofen (ADVIL,MOTRIN) 200 MG tablet Take 400-600 mg by mouth 2 (two) times daily as needed (pain).     Historical Provider, MD  lidocaine (XYLOCAINE) 5 % ointment Apply 1 application topically 3 (three) times daily as needed for mild pain or moderate pain.    Historical Provider, MD  loratadine (CLARITIN) 10 MG tablet Take 10 mg by mouth daily as needed for allergies.    Historical Provider, MD  losartan (COZAAR) 100 MG tablet Take 1 tablet (100 mg total) by mouth daily. 09/03/15 10/03/15  Brittainy Erie Noe, PA-C  MAGNESIUM PO Take 1 tablet by mouth daily.    Historical Provider, MD  meclizine (ANTIVERT) 25 MG tablet Take 1 tablet (25 mg total) by mouth 3 (three) times daily as needed for dizziness. 04/14/13   John Molpus, MD  meloxicam (MOBIC) 15 MG tablet Take 1 tablet (15 mg total) by mouth  daily. 11/15/15   Max T Hyatt, DPM  metoprolol succinate (TOPROL-XL) 100 MG 24 hr tablet Take 1 tablet (100 mg total) by mouth 2 (two) times daily. 11/22/15   Liliane Shi, PA-C  montelukast (SINGULAIR) 10 MG tablet Take 10 mg by mouth daily as needed (seasonal allergies).    Historical Provider, MD  Multiple Vitamin (MULTIVITAMIN WITH MINERALS) TABS Take 1 tablet by mouth daily.    Historical Provider, MD  nystatin (MYCOSTATIN) 100000 UNIT/ML suspension Take 5 mLs by mouth daily as needed (FLARES, LICHEN PLANTIS).    Historical Provider, MD  nystatin cream (MYCOSTATIN) Apply 1 application topically 2 (two) times daily as needed for dry skin.    Historical Provider, MD  omeprazole (PRILOSEC OTC) 20 MG tablet Take 20 mg by mouth daily.     Historical Provider, MD  phenazopyridine (PYRIDIUM) 200 MG tablet Take 200 mg by mouth 3 (three) times daily as needed for pain Stark Jock TRACT PAIN).    Historical Provider, MD  potassium chloride SA (KLOR-CON M20) 20 MEQ tablet Take 1.5 tablets (30 mEq total) by mouth daily. 07/16/15   Scott T Weaver, PA-C  PREVIDENT 5000 DRY MOUTH 1.1 % GEL dental gel USE ONCE DAILY IN PLACE OF REGULAR TOOTHEPASTE 09/01/14   Historical Provider, MD  Probiotic Product (ALIGN PO) Take 1 capsule by mouth daily.     Historical Provider, MD  simvastatin (ZOCOR) 40 MG tablet Take 40 mg by mouth daily.     Historical Provider, MD  valACYclovir (VALTREX) 500 MG tablet Take 500 mg by mouth 3 (three) times daily as needed (OUTBREAKS).    Historical Provider, MD  valACYclovir (VALTREX) 500 MG tablet INCREASE TO TAKING 1 TABLET 2 TIMES A DAY FOR 3 DAYS WITH OUTBREAK 10/10/15   Kem Boroughs, FNP  zolpidem (AMBIEN) 10 MG tablet TAKE 1/2 TABLET BY MOUTH AT BEDTIME AS NEEDED FOR SLEEP  Elveria Rising, MD    Family History Family History  Problem Relation Age of Onset  . Dementia Mother   . Leukemia Child   . Cirrhosis Father     Social History Social History  Substance Use Topics  .  Smoking status: Former Smoker    Packs/day: 0.25    Years: 2.00    Types: Cigarettes    Quit date: 02/04/1976  . Smokeless tobacco: Never Used  . Alcohol use Yes     Comment: occasional     Allergies   Hydrocodone-acetaminophen; Hydrocodone; and Tramadol   Review of Systems Review of Systems  Constitutional: Positive for activity change and appetite change. Negative for fatigue and fever.  HENT: Negative for congestion and rhinorrhea.   Respiratory: Negative for cough and shortness of breath.   Cardiovascular: Negative for chest pain.  Gastrointestinal: Positive for abdominal pain. Negative for diarrhea, nausea and vomiting.  Genitourinary: Positive for dysuria. Negative for vaginal bleeding and vaginal discharge.  Musculoskeletal: Positive for back pain. Negative for arthralgias and myalgias.  Neurological: Negative for dizziness, weakness and headaches.   A complete 10 system review of systems was obtained and all systems are negative except as noted in the HPI and PMH.    Physical Exam Updated Vital Signs BP 174/74 (BP Location: Right Arm)   Pulse 67   Temp 98.3 F (36.8 C)   Resp 16   Ht 5\' 4"  (1.626 m)   Wt 163 lb (73.9 kg)   SpO2 100%   BMI 27.98 kg/m   Physical Exam  Constitutional: She is oriented to person, place, and time. She appears well-developed and well-nourished. No distress.  HENT:  Head: Normocephalic and atraumatic.  Mouth/Throat: Oropharynx is clear and moist. No oropharyngeal exudate.  Eyes: Conjunctivae and EOM are normal. Pupils are equal, round, and reactive to light.  Neck: Normal range of motion. Neck supple.  No meningismus.  Cardiovascular: Normal rate, regular rhythm, normal heart sounds and intact distal pulses.   No murmur heard. Pulmonary/Chest: Effort normal and breath sounds normal. No respiratory distress.  Abdominal: Soft. There is tenderness. There is no rebound and no guarding.  Well healed surgical incisions. Mild diffuse  tenderness. No guarding or rebound  Musculoskeletal: Normal range of motion. She exhibits no edema or tenderness.  No CVAT  Neurological: She is alert and oriented to person, place, and time. No cranial nerve deficit. She exhibits normal muscle tone. Coordination normal.  No ataxia on finger to nose bilaterally. No pronator drift. 5/5 strength throughout. CN 2-12 intact.Equal grip strength. Sensation intact.   Skin: Skin is warm.  Psychiatric: She has a normal mood and affect. Her behavior is normal.  Nursing note and vitals reviewed.    ED Treatments / Results  Labs (all labs ordered are listed, but only abnormal results are displayed) Labs Reviewed  CBC WITH DIFFERENTIAL/PLATELET - Abnormal; Notable for the following:       Result Value   WBC 12.4 (*)    Neutro Abs 8.8 (*)    Monocytes Absolute 1.3 (*)    All other components within normal limits  COMPREHENSIVE METABOLIC PANEL - Abnormal; Notable for the following:    Chloride 100 (*)    Glucose, Bld 120 (*)    BUN 24 (*)    Total Protein 6.4 (*)    Total Bilirubin 1.3 (*)    All other components within normal limits  URINALYSIS, ROUTINE W REFLEX MICROSCOPIC - Abnormal; Notable for the following:  Color, Urine RED (*)    APPearance CLOUDY (*)    Ketones, ur 15 (*)    Nitrite POSITIVE (*)    Leukocytes, UA MODERATE (*)    All other components within normal limits  URINALYSIS, MICROSCOPIC (REFLEX) - Abnormal; Notable for the following:    Bacteria, UA FEW (*)    Squamous Epithelial / LPF 0-5 (*)    All other components within normal limits  URINE CULTURE  LIPASE, BLOOD  I-STAT CG4 LACTIC ACID, ED    EKG  EKG Interpretation None       Radiology Ct Abdomen Pelvis W Contrast  Result Date: 03/12/2016 CLINICAL DATA:  Diffuse abdominal pain for 5 hours. Multiple ventral hernia repairs. EXAM: CT ABDOMEN AND PELVIS WITH CONTRAST TECHNIQUE: Multidetector CT imaging of the abdomen and pelvis was performed using the  standard protocol following bolus administration of intravenous contrast. CONTRAST:  143mL ISOVUE-300 IOPAMIDOL (ISOVUE-300) INJECTION 61% COMPARISON:  03/01/2015 FINDINGS: Lower chest: 4 mm nodule in the left lung base similar previous study. Hepatobiliary: Multiple low-attenuation lesions in the liver, likely representing cysts. No change since prior study. Gallbladder and bile ducts are unremarkable. Pancreas: Unremarkable. No pancreatic ductal dilatation or surrounding inflammatory changes. Spleen: Normal in size without focal abnormality. Adrenals/Urinary Tract: Adrenal glands are unremarkable. Kidneys are normal, without renal calculi, focal lesion, or hydronephrosis. Bladder is unremarkable. Stomach/Bowel: Stomach is within normal limits. Appendix appears normal. No evidence of bowel wall thickening, distention, or inflammatory changes. Vascular/Lymphatic: Aortic atherosclerosis. No enlarged abdominal or pelvic lymph nodes. Reproductive: Uterus and ovaries are not enlarged. Small amount of free fluid in the pelvis is of nonspecific etiology. Possibly reactive or inflammatory. Other: Surgical scarring in the anterior abdominal wall. No focal defects identified. Small right inguinal hernia containing fat. No free air. Musculoskeletal: Degenerative changes in the spine. No destructive bone lesions. Mild spondylolisthesis at L4-5 is likely degenerative. IMPRESSION: No acute process demonstrated in the abdomen or pelvis. No evidence of bowel obstruction or inflammation. Multiple hepatic cysts. Small amount of fluid in the pelvis is nonspecific. Electronically Signed   By: Lucienne Capers M.D.   On: 03/12/2016 06:20    Procedures Procedures (including critical care time)  Medications Ordered in ED Medications - No data to display   Initial Impression / Assessment and Plan / ED Course  I have reviewed the triage vital signs and the nursing notes.  Pertinent labs & imaging results that were available  during my care of the patient were reviewed by me and considered in my medical decision making (see chart for details).     Abdominal pain without associated symptoms.  Similar to previous pain from hernias.  No appreciable hernia on exam.  Labs, UA.  Will obtain imaging based on her history.  LFTs, lipase normal. Normal lactate.  Does have UTI, cultures sent. Rocephin given.  CT doesn't show any acute pathology. Stable postsurgical changes. No ureteral stones.  Patient feels improved. She is tolerating by mouth. We'll treat UTI. Culture sent. Follow with PCP. Return precautions discussed.  Final Clinical Impressions(s) / ED Diagnoses   Final diagnoses:  Abdominal pain, unspecified abdominal location  Urinary tract infection without hematuria, site unspecified    New Prescriptions New Prescriptions   No medications on file     Ezequiel Essex, MD 03/12/16 [phone]

## 2016-03-12 NOTE — Discharge Instructions (Signed)
Your CT scan is reassuring. Take the antibiotics as prescribed. Followup with your doctor. Return to the ED if you develop new or worsening symptoms.

## 2016-03-13 LAB — URINE CULTURE: Culture: NO GROWTH

## 2016-03-25 ENCOUNTER — Other Ambulatory Visit: Payer: Self-pay | Admitting: Nurse Practitioner

## 2016-03-25 NOTE — Telephone Encounter (Signed)
Medication refill request: Valacyclovir Last AEX:  10/20/14 (Last appt for Vulvar Irritation) Next AEX: Not scheduled Last MMG (if hormonal medication request): 09/20/04 [id number]  Refill authorized: Historical

## 2016-03-25 NOTE — Telephone Encounter (Signed)
Needs AEX

## 2016-03-26 ENCOUNTER — Telehealth: Payer: Self-pay

## 2016-03-26 NOTE — Telephone Encounter (Signed)
Left message for patient to call office and schedule AEX.

## 2016-03-26 NOTE — Telephone Encounter (Signed)
Let message for patient to call office to schedule AEX.

## 2016-03-31 NOTE — Telephone Encounter (Signed)
Left message for patient to call office to schedule AEX.

## 2016-03-31 NOTE — Telephone Encounter (Signed)
Left message for patient to call and schedule AEX.

## 2016-04-01 NOTE — Telephone Encounter (Signed)
Multiple calls have been made to attempt to schedule AEX. Messages were left. Please advise if ok to close encounter. Thanks.

## 2016-04-01 NOTE — Telephone Encounter (Signed)
OK to close

## 2016-06-13 ENCOUNTER — Other Ambulatory Visit: Payer: Self-pay | Admitting: Orthopedic Surgery

## 2016-06-13 ENCOUNTER — Ambulatory Visit
Admission: RE | Admit: 2016-06-13 | Discharge: 2016-06-13 | Disposition: A | Discharge status: Home / Self Care | Payer: BC Managed Care – PPO | Source: Ambulatory Visit | Attending: Orthopedic Surgery | Admitting: Orthopedic Surgery

## 2016-06-13 ENCOUNTER — Other Ambulatory Visit (HOSPITAL_COMMUNITY): Payer: BC Managed Care – PPO

## 2016-06-13 DIAGNOSIS — M25521 Pain in right elbow: Secondary | ICD-10-CM

## 2016-06-16 ENCOUNTER — Ambulatory Visit: Payer: Self-pay | Admitting: Orthopedic Surgery

## 2016-06-16 ENCOUNTER — Encounter (HOSPITAL_COMMUNITY): Payer: Self-pay | Admitting: Urology

## 2016-06-16 ENCOUNTER — Other Ambulatory Visit: Payer: Self-pay | Admitting: Orthopedic Surgery

## 2016-06-16 NOTE — Progress Notes (Signed)
Anesthesia Chart Review: SAME DAY WORK-UP.   Patient is a 66 year old female scheduled for ORIF versus right radial head arthroplasty with ligament repair, reconstruction on 06/17/16 by Dr. Amedeo Plenty.  History includes former smoker (quit '78), palpitations (PVCs), HLD, hiatal hernia, GERD, IBS, interstitial cystitis, asthma, claustrophobia, depression, vertigo, HTN, "blood clots" (leg, 40 years ago), MRSA, TIA '13, migraines, anemia, IBS, urethral stricture s/p dilatation 02/18/13, dry mouth/eyes, endometrial ablation, umbilical hernia repair, LOA with incisional hernia repair 03/26/15.   - PCP is Bing Matter, PA-C with [facility]. - Cardiologist is Dr. Peter Martinique. Last visit was on 09/03/15 with Lyda Jester, PA-C for follow-up palpitations and HTN. 48 hour event monitor ordered and showed rare PVCs without significant arrhythmia. Patient was also seen at [facility] on 09/17/15 by Elberta Leatherwood, [place]. Patient was to monitor her BP at home more closely, so no adjustments made at that time.  Meds include albuterol, Wellbutrin XL, Valium, Bentyl, flax see, Flonase, Allegra, HCTZ, losartan, meclizine, Toprol XL, Singulair, Prilosec, Oxy IR, Pyridium, KCl, Zocor, Valtrex, Ambien, dexamethasone  PRN Lic[place] (as a mouthrinse).    EKG 07/16/15: NSR, LAD, possible anterior infarct (age undetermined). Per PAT phone RN, patient reported intermittent palpitations, weekly to monthly that are not significantly symptomatic but may be associated with some dizziness initially but no syncope.   09/2015 48 hr Holter monitor:  Normal sinus rhythm  Rare isolated PVCs. Total of 35 in 48 hours  Diary events occur with and without PVCs. Rare PVCs. No significant arrhythmia.  Echo 05/26/11: Study Conclusions - Left ventricle: The cavity size was normal. Wall thickness was increased in a pattern of mild LVH. Systolic function was vigorous. The estimated ejection fraction was in  the range of 65% to 70%. Wall motion was normal; there were no regional wall motion abnormalities. Doppler parameters are consistent with abnormal left ventricular relaxation (grade 1 diastolic dysfunction). - Aortic valve: Mild regurgitation. - Atrial septum: No defect or patent foramen ovale was identified.  Stress echo 06/20/08 (scanned under Media tab): Normal stress echo evaluation at good exercise level.   Carotid Doppler 05/26/11: Summary: No significant extracranial carotid artery stenosis demonstrated. Vertebrals are patent with antegrade flow.  She is for labs on arrival.  Further evaluation on the day of surgery to ensure no acute changes and that labs are acceptable prior to proceeding.  George Hugh [facility] Short Stay Center/Anesthesiology Phone [phone] 06/16/2016 2:30 PM

## 2016-06-16 NOTE — Progress Notes (Signed)
PCP Bing Matter with [facility] Cardiologist: Dr. Martinique, sees Cynthia Peters Pt reports hx palpitations with PVCs with some dizziness when having PVCs at first, pt with no reports of syncope, shortness of breath, or chest pain.  Pt verbalized understanding to pre-op instructions. Spoke with Ebony Hail PA with anesthesia regarding pr hx of PVC's

## 2016-06-17 ENCOUNTER — Encounter (HOSPITAL_COMMUNITY): Payer: Self-pay | Admitting: Surgery

## 2016-06-17 ENCOUNTER — Encounter (HOSPITAL_COMMUNITY)
Admission: RE | Disposition: A | Discharge status: Home / Self Care | Payer: Self-pay | Source: Ambulatory Visit | Attending: Orthopedic Surgery

## 2016-06-17 ENCOUNTER — Observation Stay (HOSPITAL_COMMUNITY)
Admission: RE | Admit: 2016-06-17 | Discharge: 2016-06-18 | Disposition: A | Discharge status: Home / Self Care | Payer: BC Managed Care – PPO | Source: Ambulatory Visit | Attending: Orthopedic Surgery | Admitting: Orthopedic Surgery

## 2016-06-17 ENCOUNTER — Ambulatory Visit (HOSPITAL_COMMUNITY): Payer: BC Managed Care – PPO | Admitting: Vascular Surgery

## 2016-06-17 DIAGNOSIS — Z8673 Personal history of transient ischemic attack (TIA), and cerebral infarction without residual deficits: Secondary | ICD-10-CM | POA: Insufficient documentation

## 2016-06-17 DIAGNOSIS — Z791 Long term (current) use of non-steroidal anti-inflammatories (NSAID): Secondary | ICD-10-CM | POA: Insufficient documentation

## 2016-06-17 DIAGNOSIS — S52121A Displaced fracture of head of right radius, initial encounter for closed fracture: Secondary | ICD-10-CM

## 2016-06-17 DIAGNOSIS — I10 Essential (primary) hypertension: Secondary | ICD-10-CM | POA: Insufficient documentation

## 2016-06-17 DIAGNOSIS — F329 Major depressive disorder, single episode, unspecified: Secondary | ICD-10-CM | POA: Diagnosis not present

## 2016-06-17 DIAGNOSIS — K219 Gastro-esophageal reflux disease without esophagitis: Secondary | ICD-10-CM | POA: Insufficient documentation

## 2016-06-17 DIAGNOSIS — Z79899 Other long term (current) drug therapy: Secondary | ICD-10-CM | POA: Diagnosis not present

## 2016-06-17 DIAGNOSIS — G4700 Insomnia, unspecified: Secondary | ICD-10-CM | POA: Diagnosis not present

## 2016-06-17 DIAGNOSIS — S52131A Displaced fracture of neck of right radius, initial encounter for closed fracture: Secondary | ICD-10-CM | POA: Diagnosis not present

## 2016-06-17 DIAGNOSIS — F40240 Claustrophobia: Secondary | ICD-10-CM | POA: Diagnosis not present

## 2016-06-17 DIAGNOSIS — E785 Hyperlipidemia, unspecified: Secondary | ICD-10-CM | POA: Insufficient documentation

## 2016-06-17 DIAGNOSIS — Z8614 Personal history of Methicillin resistant Staphylococcus aureus infection: Secondary | ICD-10-CM | POA: Diagnosis not present

## 2016-06-17 DIAGNOSIS — W010XXA Fall on same level from slipping, tripping and stumbling without subsequent striking against object, initial encounter: Secondary | ICD-10-CM | POA: Diagnosis not present

## 2016-06-17 DIAGNOSIS — K589 Irritable bowel syndrome without diarrhea: Secondary | ICD-10-CM | POA: Insufficient documentation

## 2016-06-17 DIAGNOSIS — Z87891 Personal history of nicotine dependence: Secondary | ICD-10-CM | POA: Insufficient documentation

## 2016-06-17 DIAGNOSIS — I493 Ventricular premature depolarization: Secondary | ICD-10-CM | POA: Insufficient documentation

## 2016-06-17 HISTORY — DX: Ventricular premature depolarization: I49.3

## 2016-06-17 HISTORY — PX: RADIAL HEAD ARTHROPLASTY: SUR75

## 2016-06-17 HISTORY — DX: Headache, unspecified: R51.9

## 2016-06-17 HISTORY — DX: Displaced fracture of head of right radius, initial encounter for closed fracture: S52.121A

## 2016-06-17 HISTORY — DX: Headache: R51

## 2016-06-17 HISTORY — DX: Dry mouth, unspecified: R68.2

## 2016-06-17 HISTORY — PX: ORIF RADIAL FRACTURE: SHX5113

## 2016-06-17 HISTORY — DX: Anemia, unspecified: D64.9

## 2016-06-17 HISTORY — DX: Dry eye syndrome of unspecified lacrimal gland: H04.129

## 2016-06-17 LAB — CBC
HCT: 42.7 % (ref 36.0–46.0)
Hemoglobin: 14.8 g/dL (ref 12.0–15.0)
MCH: 33 pg (ref 26.0–34.0)
MCHC: 34.7 g/dL (ref 30.0–36.0)
MCV: 95.1 fL (ref 78.0–100.0)
Platelets: 236 10*3/uL (ref 150–400)
RBC: 4.49 MIL/uL (ref 3.87–5.11)
RDW: 12.8 % (ref 11.5–15.5)
WBC: 9.3 10*3/uL (ref 4.0–10.5)

## 2016-06-17 LAB — BASIC METABOLIC PANEL
Anion gap: 9 (ref 5–15)
BUN: 17 mg/dL (ref 6–20)
CO2: 23 mmol/L (ref 22–32)
Calcium: 9.7 mg/dL (ref 8.9–10.3)
Chloride: 106 mmol/L (ref 101–111)
Creatinine, Ser: 0.94 mg/dL (ref 0.44–1.00)
GFR calc Af Amer: >60 mL/min (ref >60)
GFR calc non Af Amer: >60 mL/min (ref >60)
Glucose, Bld: 112 mg/dL — ABNORMAL HIGH (ref 65–99)
Potassium: 4.5 mmol/L (ref 3.5–5.1)
Sodium: 138 mmol/L (ref 135–145)

## 2016-06-17 SURGERY — OPEN REDUCTION INTERNAL FIXATION (ORIF) RADIAL FRACTURE
Anesthesia: General | Site: Elbow | Laterality: Right

## 2016-06-17 MED ORDER — LIDOCAINE 2% (20 MG/ML) 5 ML SYRINGE
INTRAMUSCULAR | Status: AC
  Filled 2016-06-17: qty 5

## 2016-06-17 MED ORDER — FAMOTIDINE 20 MG PO TABS
20.0000 mg | ORAL_TABLET | Freq: Two times a day (BID) | ORAL | Status: DC | PRN
  Administered 2016-06-18: 20 mg via ORAL
  Filled 2016-06-17: qty 1

## 2016-06-17 MED ORDER — CEFAZOLIN SODIUM-DEXTROSE 2-4 GM/100ML-% IV SOLN
INTRAVENOUS | Status: AC
  Filled 2016-06-17: qty 100

## 2016-06-17 MED ORDER — CEFAZOLIN SODIUM-DEXTROSE 1-4 GM/50ML-% IV SOLN
1.0000 g | Freq: Three times a day (TID) | INTRAVENOUS | Status: DC
  Administered 2016-06-18 (×2): 1 g via INTRAVENOUS
  Filled 2016-06-17 (×5): qty 50

## 2016-06-17 MED ORDER — CEFAZOLIN SODIUM-DEXTROSE 1-4 GM/50ML-% IV SOLN
1.0000 g | INTRAVENOUS | Status: AC
  Administered 2016-06-18: 1 g via INTRAVENOUS
  Filled 2016-06-17: qty 50

## 2016-06-17 MED ORDER — ONDANSETRON HCL 4 MG/2ML IJ SOLN
INTRAMUSCULAR | Status: DC | PRN
  Administered 2016-06-17: 4 mg via INTRAVENOUS

## 2016-06-17 MED ORDER — ROPIVACAINE HCL 5 MG/ML IJ SOLN
INTRAMUSCULAR | Status: DC | PRN
  Administered 2016-06-17: 20 mL via PERINEURAL

## 2016-06-17 MED ORDER — FENTANYL CITRATE (PF) 100 MCG/2ML IJ SOLN
50.0000 ug | Freq: Once | INTRAMUSCULAR | Status: AC
  Administered 2016-06-17: 50 ug via INTRAVENOUS
  Filled 2016-06-17: qty 1

## 2016-06-17 MED ORDER — CHLORHEXIDINE GLUCONATE 4 % EX LIQD: 60.0000 mL | Freq: Once | CUTANEOUS | Status: DC

## 2016-06-17 MED ORDER — LIDOCAINE HCL (CARDIAC) 20 MG/ML IV SOLN
INTRAVENOUS | Status: DC | PRN
  Administered 2016-06-17: 60 mg via INTRAVENOUS

## 2016-06-17 MED ORDER — ONDANSETRON HCL 4 MG/2ML IJ SOLN: 4.0000 mg | Freq: Four times a day (QID) | INTRAMUSCULAR | Status: DC | PRN

## 2016-06-17 MED ORDER — MIDAZOLAM HCL 2 MG/2ML IJ SOLN
1.0000 mg | Freq: Once | INTRAMUSCULAR | Status: AC
  Administered 2016-06-17: 1 mg via INTRAVENOUS
  Filled 2016-06-17: qty 1

## 2016-06-17 MED ORDER — FENTANYL CITRATE (PF) 100 MCG/2ML IJ SOLN
INTRAMUSCULAR | Status: AC
  Filled 2016-06-17: qty 2

## 2016-06-17 MED ORDER — OXYCODONE HCL 5 MG PO TABS
ORAL_TABLET | ORAL | Status: AC
Start: 2016-06-17 — End: 2016-06-18
  Filled 2016-06-17: qty 2

## 2016-06-17 MED ORDER — METHOCARBAMOL 500 MG PO TABS
ORAL_TABLET | ORAL | Status: AC
  Filled 2016-06-17: qty 1

## 2016-06-17 MED ORDER — METHOCARBAMOL 1000 MG/10ML IJ SOLN
500.0000 mg | Freq: Four times a day (QID) | INTRAVENOUS | Status: DC | PRN
  Filled 2016-06-17: qty 5

## 2016-06-17 MED ORDER — METHOCARBAMOL 500 MG PO TABS
500.0000 mg | ORAL_TABLET | Freq: Four times a day (QID) | ORAL | Status: DC | PRN
Start: 2016-06-17 — End: 2016-06-18
  Administered 2016-06-17 – 2016-06-18 (×3): 500 mg via ORAL
  Filled 2016-06-17 (×2): qty 1

## 2016-06-17 MED ORDER — MEPERIDINE HCL 25 MG/ML IJ SOLN: 6.2500 mg | INTRAMUSCULAR | Status: DC | PRN

## 2016-06-17 MED ORDER — FENTANYL CITRATE (PF) 100 MCG/2ML IJ SOLN
25.0000 ug | INTRAMUSCULAR | Status: DC | PRN
  Administered 2016-06-17 (×2): 50 ug via INTRAVENOUS

## 2016-06-17 MED ORDER — ONDANSETRON HCL 4 MG PO TABS: 4.0000 mg | ORAL_TABLET | Freq: Four times a day (QID) | ORAL | Status: DC | PRN

## 2016-06-17 MED ORDER — PROPOFOL 10 MG/ML IV BOLUS
INTRAVENOUS | Status: AC
  Filled 2016-06-17: qty 20

## 2016-06-17 MED ORDER — KETOROLAC TROMETHAMINE 30 MG/ML IJ SOLN
30.0000 mg | Freq: Once | INTRAMUSCULAR | Status: DC | PRN
  Administered 2016-06-17: 30 mg via INTRAVENOUS

## 2016-06-17 MED ORDER — PROMETHAZINE HCL 25 MG/ML IJ SOLN
6.2500 mg | INTRAMUSCULAR | Status: DC | PRN
  Administered 2016-06-17: 6.25 mg via INTRAVENOUS

## 2016-06-17 MED ORDER — OXYCODONE HCL 5 MG PO TABS
5.0000 mg | ORAL_TABLET | ORAL | Status: DC | PRN
  Administered 2016-06-17 – 2016-06-18 (×6): 10 mg via ORAL
  Filled 2016-06-17 (×5): qty 2

## 2016-06-17 MED ORDER — SODIUM CHLORIDE 0.45 % IV SOLN
INTRAVENOUS | Status: DC
  Administered 2016-06-18: 01:00:00 via INTRAVENOUS

## 2016-06-17 MED ORDER — ALPRAZOLAM 0.5 MG PO TABS
0.5000 mg | ORAL_TABLET | Freq: Four times a day (QID) | ORAL | Status: DC | PRN
  Administered 2016-06-18 (×2): 0.5 mg via ORAL
  Filled 2016-06-17 (×2): qty 1

## 2016-06-17 MED ORDER — CEFAZOLIN SODIUM-DEXTROSE 2-4 GM/100ML-% IV SOLN
2.0000 g | INTRAVENOUS | Status: AC
  Administered 2016-06-17: 2 g via INTRAVENOUS

## 2016-06-17 MED ORDER — PROMETHAZINE HCL 25 MG RE SUPP: 12.5000 mg | Freq: Four times a day (QID) | RECTAL | Status: DC | PRN

## 2016-06-17 MED ORDER — KETOROLAC TROMETHAMINE 30 MG/ML IJ SOLN
INTRAMUSCULAR | Status: AC
  Filled 2016-06-17: qty 1

## 2016-06-17 MED ORDER — PROMETHAZINE HCL 25 MG/ML IJ SOLN
INTRAMUSCULAR | Status: AC
  Filled 2016-06-17: qty 1

## 2016-06-17 MED ORDER — FENTANYL CITRATE (PF) 100 MCG/2ML IJ SOLN
INTRAMUSCULAR | Status: DC | PRN
  Administered 2016-06-17: 150 ug via INTRAVENOUS

## 2016-06-17 MED ORDER — HYDROMORPHONE HCL 1 MG/ML IJ SOLN
0.5000 mg | INTRAMUSCULAR | Status: DC | PRN
  Administered 2016-06-18: 1 mg via INTRAVENOUS
  Filled 2016-06-17: qty 1

## 2016-06-17 MED ORDER — BUPIVACAINE HCL (PF) 0.25 % IJ SOLN
INTRAMUSCULAR | Status: AC
  Filled 2016-06-17: qty 30

## 2016-06-17 MED ORDER — FENTANYL CITRATE (PF) 100 MCG/2ML IJ SOLN
INTRAMUSCULAR | Status: AC
  Administered 2016-06-17: 50 ug via INTRAVENOUS
  Filled 2016-06-17: qty 2

## 2016-06-17 MED ORDER — MIDAZOLAM HCL 2 MG/2ML IJ SOLN
INTRAMUSCULAR | Status: AC
  Administered 2016-06-17: 1 mg via INTRAVENOUS
  Filled 2016-06-17: qty 2

## 2016-06-17 MED ORDER — SENNA 8.6 MG PO TABS
1.0000 | ORAL_TABLET | Freq: Two times a day (BID) | ORAL | Status: DC
  Filled 2016-06-17 (×2): qty 1

## 2016-06-17 MED ORDER — ONDANSETRON HCL 4 MG/2ML IJ SOLN
INTRAMUSCULAR | Status: AC
  Filled 2016-06-17: qty 2

## 2016-06-17 MED ORDER — SUCCINYLCHOLINE CHLORIDE 200 MG/10ML IV SOSY
PREFILLED_SYRINGE | INTRAVENOUS | Status: AC
  Filled 2016-06-17: qty 10

## 2016-06-17 MED ORDER — LACTATED RINGERS IV SOLN
INTRAVENOUS | Status: DC
  Administered 2016-06-17 (×2): via INTRAVENOUS

## 2016-06-17 MED ORDER — EPHEDRINE SULFATE 50 MG/ML IJ SOLN
INTRAMUSCULAR | Status: DC | PRN
  Administered 2016-06-17: 10 mg via INTRAVENOUS

## 2016-06-17 MED ORDER — FENTANYL CITRATE (PF) 250 MCG/5ML IJ SOLN
INTRAMUSCULAR | Status: AC
  Filled 2016-06-17: qty 5

## 2016-06-17 MED ORDER — PHENYLEPHRINE HCL 10 MG/ML IJ SOLN
INTRAMUSCULAR | Status: DC | PRN
Start: 2016-06-17 — End: 2016-06-17
  Administered 2016-06-17: 160 ug via INTRAVENOUS
  Administered 2016-06-17: 80 ug via INTRAVENOUS
  Administered 2016-06-17: 140 ug via INTRAVENOUS

## 2016-06-17 MED ORDER — 0.9 % SODIUM CHLORIDE (POUR BTL) OPTIME
TOPICAL | Status: DC | PRN
  Administered 2016-06-17: 1000 mL

## 2016-06-17 MED ORDER — VITAMIN C 500 MG PO TABS
1000.0000 mg | ORAL_TABLET | Freq: Every day | ORAL | Status: DC
  Administered 2016-06-18 (×2): 1000 mg via ORAL
  Filled 2016-06-17 (×2): qty 2

## 2016-06-17 MED ORDER — PROPOFOL 10 MG/ML IV BOLUS
INTRAVENOUS | Status: DC | PRN
  Administered 2016-06-17: 120 mg via INTRAVENOUS

## 2016-06-17 SURGICAL SUPPLY — 56 items
BANDAGE ACE 3X5.8 VEL STRL LF (GAUZE/BANDAGES/DRESSINGS) ×2 IMPLANT
BANDAGE ACE 4X5 VEL STRL LF (GAUZE/BANDAGES/DRESSINGS) ×2 IMPLANT
BLADE CLIPPER SURG (BLADE) IMPLANT
BLADE LONG MED 31X9 (MISCELLANEOUS) ×2 IMPLANT
BNDG ESMARK 4X9 LF (GAUZE/BANDAGES/DRESSINGS) ×2 IMPLANT
BNDG GAUZE ELAST 4 BULKY (GAUZE/BANDAGES/DRESSINGS) ×2 IMPLANT
CANISTER SUCTION WELLS/JOHNSON (MISCELLANEOUS) ×2 IMPLANT
CORDS BIPOLAR (ELECTRODE) ×2 IMPLANT
COVER SURGICAL LIGHT HANDLE (MISCELLANEOUS) ×2 IMPLANT
CUFF TOURNIQUET SINGLE 18IN (TOURNIQUET CUFF) ×2 IMPLANT
CUFF TOURNIQUET SINGLE 24IN (TOURNIQUET CUFF) IMPLANT
DECANTER SPIKE VIAL GLASS SM (MISCELLANEOUS) IMPLANT
DRAIN TLS ROUND 10FR (DRAIN) IMPLANT
DRAPE OEC MINIVIEW 54X84 (DRAPES) ×2 IMPLANT
DRAPE SURG 17X23 STRL (DRAPES) ×2 IMPLANT
DRAPE U-SHAPE 47X51 STRL (DRAPES) IMPLANT
DRSG ADAPTIC 3X8 NADH LF (GAUZE/BANDAGES/DRESSINGS) ×2 IMPLANT
GAUZE SPONGE 4X4 12PLY STRL (GAUZE/BANDAGES/DRESSINGS) ×2 IMPLANT
GAUZE XEROFORM 5X9 LF (GAUZE/BANDAGES/DRESSINGS) ×2 IMPLANT
GLOVE BIOGEL M 8.0 STRL (GLOVE) ×2 IMPLANT
GLOVE SS BIOGEL STRL SZ 8 (GLOVE) ×1 IMPLANT
GLOVE SUPERSENSE BIOGEL SZ 8 (GLOVE) ×1
GOWN STRL REUS W/ TWL LRG LVL3 (GOWN DISPOSABLE) ×1 IMPLANT
GOWN STRL REUS W/ TWL XL LVL3 (GOWN DISPOSABLE) ×2 IMPLANT
GOWN STRL REUS W/TWL LRG LVL3 (GOWN DISPOSABLE) ×1
GOWN STRL REUS W/TWL XL LVL3 (GOWN DISPOSABLE) ×2
HD RADIAL EXPLOR 14X22 (Head) ×2 IMPLANT
HEAD RADIAL EXPLOR 14X22 (Head) ×1 IMPLANT
IMPL STEM W/SCREW 7X26MM (Stem) ×1 IMPLANT
IMPLANT STEM W/SCREW 7X26MM (Stem) ×2 IMPLANT
KIT BASIN OR (CUSTOM PROCEDURE TRAY) ×2 IMPLANT
KIT ROOM TURNOVER OR (KITS) ×2 IMPLANT
LOOP VESSEL MAXI BLUE (MISCELLANEOUS) IMPLANT
MANIFOLD NEPTUNE II (INSTRUMENTS) IMPLANT
NEEDLE 22X1 1/2 (OR ONLY) (NEEDLE) IMPLANT
NS IRRIG 1000ML POUR BTL (IV SOLUTION) ×2 IMPLANT
PACK ORTHO EXTREMITY (CUSTOM PROCEDURE TRAY) ×2 IMPLANT
PAD ARMBOARD 7.5X6 YLW CONV (MISCELLANEOUS) ×4 IMPLANT
PAD CAST 4YDX4 CTTN HI CHSV (CAST SUPPLIES) ×1 IMPLANT
PADDING CAST COTTON 4X4 STRL (CAST SUPPLIES) ×1
SCRUB BETADINE 4OZ XXX (MISCELLANEOUS) ×2 IMPLANT
SOL PREP POV-IOD 4OZ 10% (MISCELLANEOUS) ×2 IMPLANT
SPONGE LAP 4X18 X RAY DECT (DISPOSABLE) IMPLANT
SUT FIBERWIRE 3-0 18 TAPR NDL (SUTURE) ×2
SUT MNCRL AB 4-0 PS2 18 (SUTURE) IMPLANT
SUT PROLENE 3 0 PS 2 (SUTURE) ×2 IMPLANT
SUT VIC AB 3-0 FS2 27 (SUTURE) ×2 IMPLANT
SUTURE FIBERWR 3-0 18 TAPR NDL (SUTURE) ×1 IMPLANT
SYR CONTROL 10ML LL (SYRINGE) IMPLANT
SYSTEM CHEST DRAIN TLS 7FR (DRAIN) IMPLANT
TOWEL OR 17X24 6PK STRL BLUE (TOWEL DISPOSABLE) ×2 IMPLANT
TOWEL OR 17X26 10 PK STRL BLUE (TOWEL DISPOSABLE) ×2 IMPLANT
TUBE CONNECTING 12X1/4 (SUCTIONS) ×2 IMPLANT
TUBE EVACUATION TLS (MISCELLANEOUS) ×2 IMPLANT
UNDERPAD 30X30 (UNDERPADS AND DIAPERS) ×2 IMPLANT
YANKAUER SUCT BULB TIP NO VENT (SUCTIONS) ×2 IMPLANT

## 2016-06-17 NOTE — Anesthesia Postprocedure Evaluation (Signed)
Anesthesia Post Note  Patient: Kambri A Popiel  Procedure(s) Performed: Procedure(s) (LRB): right radial head arthroplasty with ligament repair, reconstruciton (Right)  Patient location during evaluation: PACU Anesthesia Type: General Level of consciousness: awake and alert Pain management: pain level controlled Vital Signs Assessment: post-procedure vital signs reviewed and stable Respiratory status: spontaneous breathing, nonlabored ventilation, respiratory function stable and patient connected to nasal cannula oxygen Cardiovascular status: blood pressure returned to baseline and stable Postop Assessment: no signs of nausea or vomiting Anesthetic complications: no       Last Vitals:  Vitals:   06/17/16 1920 06/17/16 1935  BP: (!) 145/71 (!) 148/81  Pulse: 70 66  Resp: 10 (!) 6  Temp:      Last Pain:  Vitals:   06/17/16 1945  TempSrc:   PainSc: Asleep                 Catalina Gravel

## 2016-06-17 NOTE — Anesthesia Procedure Notes (Addendum)
Anesthesia Regional Block: Supraclavicular block   Pre-Anesthetic Checklist: ,, timeout performed, Correct Patient, Correct Site, Correct Laterality, Correct Procedure, Correct Position, site marked, Risks and benefits discussed,  Surgical consent,  Pre-op evaluation,  At surgeon's request and post-op pain management  Laterality: Right and Upper  Prep: chloraprep       Needles:  Injection technique: Single-shot  Needle Type: Echogenic Stimulator Needle     Needle Length: 5cm  Needle Gauge: 21   Needle insertion depth: 3 cm   Additional Needles:   Procedures: ultrasound guided,,,,,,,,  Narrative:  Start time: 06/17/2016 5:05 PM End time: 06/17/2016 5:13 PM Injection made incrementally with aspirations every 5 mL.  Performed by: Personally  Anesthesiologist: Lyn Hollingshead

## 2016-06-17 NOTE — Op Note (Signed)
See dictation #[id number]  SP radial head Aplasty with LUCL repair rt elbow  Trevionne Advani MD

## 2016-06-17 NOTE — Anesthesia Preprocedure Evaluation (Signed)
Anesthesia Evaluation  Patient identified by MRN, date of birth, ID band Patient awake    Reviewed: Allergy & Precautions, NPO status , Patient's Chart, lab work & pertinent test results, reviewed documented beta blocker date and time   Airway Mallampati: I  TM Distance: >3 FB Neck ROM: Full    Dental  (+) Teeth Intact, Caps, Dental Advisory Given   Pulmonary asthma , former smoker,    breath sounds clear to auscultation       Cardiovascular hypertension, Pt. on medications and Pt. on home beta blockers Normal cardiovascular exam Rhythm:Regular Rate:Normal     Neuro/Psych PSYCHIATRIC DISORDERS Anxiety Depression TIA   GI/Hepatic Neg liver ROS, hiatal hernia, GERD  Medicated,  Endo/Other  negative endocrine ROS  Renal/GU negative Renal ROS  negative genitourinary   Musculoskeletal  (+) Arthritis , Osteoarthritis,    Abdominal Normal abdominal exam  (+)   Peds negative pediatric ROS (+)  Hematology negative hematology ROS (+)   Anesthesia Other Findings Crowns across top frontHolter Monitor 48hr HU and Interp  Order# [id number]  Reading physician: Martinique, Peter M, MD Ordering physician: Consuelo Pandy, PA-C Study date: 09/10/15 Patient Information   Name MRN Description Cynthia Peters [id number] 66 y.o. Female Result Notes   Notes Recorded by Theodore Demark, RN on 09/26/2015 at 2:33 PM EDT Results noted were communicated to patient, who verbalized understanding and thanks. ------  Notes Recorded by Fidel Levy, RN on 09/26/2015 at 1:58 PM EDT [facility] on [phone] [facility]) ------  Notes Recorded by Peter M Martinique, MD on 09/23/2015 at 7:42 PM EDT Results as noted. Rare PVCs. No significant arrhythmia.  Peter Martinique MD, [facility]   Vitals   Height Weight BMI (Calculated) 5\' 4"  (1.626 m) 161 lb 12.8 oz (73.4 kg) 27.8 Study Highlights    Normal sinus rhythm  Rare isolated PVCs. Total of 35 in 48  hours  Diary events occur with and without PVCs.    Study Result   Result status: Final result *[facility]*           [address], [place] [address]            [phone]  ------------------------------------------------------------ Transthoracic Echocardiography  Patient:  Cynthia Peters, Cynthia Peters MR #:    [id number] Study Date: 05/26/2011 Gender:   F Age:    4 Height:   162.6cm Weight:   71.7kg BSA:    1.54m^2 Pt. Status: Room:    [facility] ORDERING   [place], [place] ATTENDING  [place], [place] ADMITTING  [place], [place] SONOGRAPHER Mauricio Po, RDCS, CCT cc:  ------------------------------------------------------------ LV EF: 65% -  70%  ------------------------------------------------------------ Indications:   TIA 435.9.  ------------------------------------------------------------    Reproductive/Obstetrics negative OB ROS                             Lab Results  Component Value Date   WBC 9.3 06/17/2016   HGB 14.8 06/17/2016   HCT 42.7 06/17/2016   MCV 95.1 06/17/2016   PLT 236 06/17/2016   Lab Results  Component Value Date   CREATININE 0.94 06/17/2016   BUN 17 06/17/2016   NA 138 06/17/2016   K 4.5 06/17/2016   CL 106 06/17/2016   CO2 23 06/17/2016   No results found for: INR, PROTIME  10/2014 EKG: NSR  Anesthesia Physical  Anesthesia Plan  ASA: III  Anesthesia Plan: General   Post-op Pain Management:  Regional for Post-op pain   Induction: Intravenous  Airway Management Planned: LMA  Additional Equipment:   Intra-op Plan:   Post-operative Plan:   Informed Consent: I have reviewed the patients History and Physical, chart, labs and discussed the procedure including the risks, benefits and alternatives for the proposed  anesthesia with the patient or authorized representative who has indicated his/her understanding and acceptance.   Dental advisory given  Plan Discussed with: CRNA and Surgeon  Anesthesia Plan Comments:         Anesthesia Quick Evaluation

## 2016-06-17 NOTE — Anesthesia Procedure Notes (Signed)
Procedure Name: LMA Insertion Date/Time: 06/17/2016 5:34 PM Performed by: Izora Gala Pre-anesthesia Checklist: Patient identified, Emergency Drugs available, Suction available and Patient being monitored Patient Re-evaluated:Patient Re-evaluated prior to inductionOxygen Delivery Method: Circle system utilized Preoxygenation: Pre-oxygenation with 100% oxygen Intubation Type: IV induction Ventilation: Mask ventilation without difficulty LMA: LMA inserted LMA Size: 4.0 Placement Confirmation: positive ETCO2 and breath sounds checked- equal and bilateral

## 2016-06-17 NOTE — Transfer of Care (Signed)
Immediate Anesthesia Transfer of Care Note  Patient: Cynthia Peters  Procedure(s) Performed: Procedure(s): right radial head arthroplasty with ligament repair, reconstruciton (Right)  Patient Location: PACU  Anesthesia Type:General and Regional  Level of Consciousness: awake, alert  and oriented  Airway & Oxygen Therapy: Patient Spontanous Breathing and Patient connected to nasal cannula oxygen  Post-op Assessment: Report given to RN, Post -op Vital signs reviewed and stable, Patient moving all extremities and Patient moving all extremities X 4  Post vital signs: Reviewed and stable  Last Vitals:  Vitals:   06/17/16 1705 06/17/16 1710  BP:    Pulse: 78 80  Resp: 10 13  Temp:      Last Pain:  Vitals:   06/17/16 1520  TempSrc:   PainSc: 3       Patients Stated Pain Goal: 3 (17/79/39 0300)  Complications: No apparent anesthesia complications

## 2016-06-17 NOTE — H&P (Signed)
Cynthia Peters is an 66 y.o. female.   Chief Complaint: Right elbow fracture displaced radial head and neck HPI: Patient presents for evaluation and treatment of the of their upper extremity predicament. The patient denies neck, back, chest or  abdominal pain. The patient notes that they have no lower extremity problems. The patients primary complaint is noted. We are planning surgical care pathway for the upper extremity.  Past Medical History:  Diagnosis Date  . Anemia    history in the past, not on a regular basis  . Arthritis   . Bronchitis   . Cataracts, bilateral    immature  . Claustrophobia    takes Valium daily as needed  . Complication of anesthesia    2013 SURGERY vocal cord bothering pt, used smaller ent tube after 2 hernia repair, no problem after . " I woke up during my MRI and colonoscopy."       . Depression    takes Wellbutrin daily  . Dry eye   . Dry mouth   . GERD (gastroesophageal reflux disease)    takes Omeprazole daily   . H/O hiatal hernia   . Headache    migraines with flashing lights   . History of blood clots 40 yrs    leg   . History of echocardiogram 2013   a. Echo (05/2011): Mild LVH, EF 65-70%, no WMA, Gr 1 DD, mild AI.  Marland Kitchen History of MRSA infection   . History of staph infection   . History of stress test 2010   a. ETT-Echo in 2010 was normal  . History of TIA (transient ischemic attack) 2013   a. Carotid US (05/2011): No ICA stenosis, pt unsure of this  . Hyperlipidemia    takes Simvastatin daily   . Hypertension    takes  Losartan daily  . IBS (irritable bowel syndrome)    takes Bentyl daily as needed  . Insomnia    takes Ambien nightly  . Interstitial cystitis   . Lichen planus   . Mild asthma    Albuterol inhaler as needed  . Palpitations    Event monitor in 2007 with rare PVCs // takes Metoprolol daily  . PVC (premature ventricular contraction)   . Seasonal allergies    takes Claritin daily as needed.Takes Singulair daily as  needed.  Marland Kitchen Urethral stricture   . Vertigo    takes Meclizine daily as needed    Past Surgical History:  Procedure Laterality Date  . CESAREAN SECTION    . COLONOSCOPY WITH PROPOFOL N/A 08/10/2012   Procedure: COLONOSCOPY WITH PROPOFOL;  Surgeon: Garlan Fair, MD;  Location: [facility];  Service: Endoscopy;  Laterality: N/A;  . CYSTOSCOPY WITH URETHRAL DILATATION N/A 02/18/2013   Procedure: CYSTOSCOPY WITH URETHRAL DILATATION ( NO BALLOON) AND HYDRODISTENSION;  Surgeon: Alexis Frock, MD;  Location: [facility];  Service: Urology;  Laterality: N/A;  . ENDOMETRIAL ABLATION W/ HYDROTHERMABLATOR  09-27-2002  . INCISIONAL HERNIA REPAIR N/A 10/25/2014   Procedure: HERNIA REPAIR INCISIONAL;  Surgeon: Ralene Ok, MD;  Location: [place];  Service: General;  Laterality: N/A;  . INCISIONAL HERNIA REPAIR N/A 03/26/2015   Procedure: LAPAROSCOPIC INCISIONAL HERNIA;  Surgeon: Ralene Ok, MD;  Location: [place];  Service: General;  Laterality: N/A;  . INSERTION OF MESH N/A 10/25/2014   Procedure: INSERTION OF MESH;  Surgeon: Ralene Ok, MD;  Location: [place];  Service: General;  Laterality: N/A;  . LAPAROSCOPIC LYSIS OF ADHESIONS N/A 03/26/2015   Procedure:  LAPAROSCOPIC LYSIS OF ADHESIONS;  Surgeon: Ralene Ok, MD;  Location: [place];  Service: General;  Laterality: N/A;  . LAPAROTOMY N/A 10/25/2014   Procedure: EXPLORATORY LAPAROTOMY;  Surgeon: Ralene Ok, MD;  Location: [place];  Service: General;  Laterality: N/A;  . LYSIS OF ADHESION N/A 10/25/2014   Procedure: LYSIS OF ADHESIONS ;  Surgeon: Ralene Ok, MD;  Location: [place];  Service: General;  Laterality: N/A;  . MUCOSAL ADVANCEMENT FLAP N/A 10/25/2014   Procedure: MUCOSAL ADVANCEMENT FLAP;  Surgeon: Ralene Ok, MD;  Location: [place];  Service: General;  Laterality: N/A;  . TOOTH EXTRACTION    . TRANSTHORACIC ECHOCARDIOGRAM  05-26-2011   MILD LVH/  EF 29-79%/  GRADE I DIASTOLIC DYSFUNCTION/  MILD AR//    06-20-2008  NOMRAL STRESS ECHO  . UMBILICAL HERNIA REPAIR  2013   AND REVISION THE SAME YEAR W/ MESH  . WISDOM TOOTH EXTRACTION  AGE 38    Family History  Problem Relation Age of Onset  . Dementia Mother   . Leukemia Child   . Cirrhosis Father    Social History:  reports that she quit smoking about 40 years ago. Her smoking use included Cigarettes. She has a 0.50 pack-year smoking history. She has never used smokeless tobacco. She reports that she drinks alcohol. She reports that she does not use drugs.  Allergies:  Allergies  Allergen Reactions  . Hydrocodone Rash  . Hydrocodone-Acetaminophen Rash  . Tramadol Palpitations    Medications Prior to Admission  Medication Sig Dispense Refill  . acetaminophen (TYLENOL) 500 MG tablet Take 1,000 mg by mouth every 6 (six) hours as needed (pain).     Marland Kitchen albuterol (PROVENTIL HFA;VENTOLIN HFA) 108 (90 BASE) MCG/ACT inhaler Inhale 2 puffs into the lungs every 6 (six) hours as needed for wheezing or shortness of breath.    . Biotin 5000 MCG CAPS Take 1 capsule by mouth 2 (two) times a week.     Marland Kitchen buPROPion (WELLBUTRIN XL) 300 MG 24 hr tablet Take 300 mg by mouth daily.     . Calcium Carb-Cholecalciferol (CALCIUM CARBONATE-VITAMIN D3 PO) Take 1 tablet by mouth daily.    . Cholecalciferol (VITAMIN D) 2000 units CAPS Take 2,000 Units by mouth daily.    . Coenzyme Q10 (CO Q 10) 100 MG CAPS Take 100 mg by mouth daily.     . CVS VITAMIN C 1000 MG tablet Take 1,000 mg by mouth daily.  1  . cycloSPORINE (RESTASIS) 0.05 % ophthalmic emulsion Place 1 drop into both eyes 2 (two) times daily.    Marland Kitchen dexamethasone (DECADRON) 0.5 MG/5ML solution TAKE 10 ML BY MOUTH daily as needed FOR LICHENS  (AS A MOUTHRINSE)  0  . diazepam (VALIUM) 5 MG tablet Take 2.5-5 mg by mouth daily as needed for anxiety.   0  . dicyclomine (BENTYL) 10 MG capsule Take 10 mg by mouth every 4 (four) hours as needed (for abdominal cramps).   1  . Evening Primrose Oil 1000 MG CAPS Take  1,000 mg by mouth 2 (two) times daily.     Marland Kitchen Fexofenadine HCl (ALLEGRA PO) Take 1 tablet by mouth daily as needed (ALLERGIES).    . Flaxseed, Linseed, 1000 MG CAPS Take 1,000 mg by mouth 2 (two) times daily.     . fluticasone (FLONASE) 50 MCG/ACT nasal spray Place 2 sprays into both nostrils daily as needed for allergies or rhinitis.     . hydrochlorothiazide 25 MG tablet Take 25 mg by mouth  daily.     Marland Kitchen HYDROCORTISONE ACE, RECTAL, 30 MG SUPP Place 30 mg rectally 2 (two) times daily as needed for hemorrhoids.  5  . ibuprofen (ADVIL,MOTRIN) 200 MG tablet Take 400-600 mg by mouth 2 (two) times daily as needed (pain).     . indomethacin (INDOCIN SR) 75 MG CR capsule Take 75 mg by mouth daily. with food  0  . lidocaine (XYLOCAINE) 5 % ointment Apply 1 application topically 3 (three) times daily as needed for mild pain or moderate pain.    Marland Kitchen losartan (COZAAR) 100 MG tablet Take 1 tablet (100 mg total) by mouth daily. 30 tablet 11  . meloxicam (MOBIC) 15 MG tablet Take 1 tablet (15 mg total) by mouth daily. (Patient taking differently: Take 15 mg by mouth daily as needed for pain. ) 30 tablet 3  . metoprolol succinate (TOPROL-XL) 100 MG 24 hr tablet Take 1 tablet (100 mg total) by mouth 2 (two) times daily. (Patient taking differently: Take 100 mg by mouth daily. ) 180 tablet 3  . montelukast (SINGULAIR) 10 MG tablet Take 10 mg by mouth daily.     . Multiple Vitamin (MULTIVITAMIN WITH MINERALS) TABS Take 1 tablet by mouth daily.    Marland Kitchen nystatin (MYCOSTATIN) 100000 UNIT/ML suspension Take 5 mLs by mouth daily as needed (FLARES, LICHEN PLANTIS).    Marland Kitchen nystatin cream (MYCOSTATIN) Apply 1 application topically 2 (two) times daily as needed for dry skin.    Marland Kitchen omeprazole (PRILOSEC) 20 MG capsule Take 20 mg by mouth daily.    Marland Kitchen oxyCODONE (OXY IR/ROXICODONE) 5 MG immediate release tablet Take 5 mg by mouth every 4 (four) hours as needed for pain.  0  . PAZEO 0.7 % SOLN Place 1 drop into both eyes daily as needed  for allergies.  4  . phenazopyridine (PYRIDIUM) 200 MG tablet Take 200 mg by mouth 3 (three) times daily as needed for pain [place] TRACT PAIN).    Marland Kitchen potassium chloride (K-DUR,KLOR-CON) 10 MEQ tablet Take 10 mEq by mouth daily.    . potassium chloride SA (KLOR-CON M20) 20 MEQ tablet Take 1.5 tablets (30 mEq total) by mouth daily. (Patient taking differently: Take 20 mEq by mouth daily. ) 45 tablet 11  . Probiotic Product (ALIGN PO) Take 1 capsule by mouth 2 (two) times daily.     . simvastatin (ZOCOR) 40 MG tablet Take 40 mg by mouth daily.     . valACYclovir (VALTREX) 500 MG tablet INCREASE TO TAKING 1 TABLET 2 TIMES A DAY FOR 3 DAYS WITH OUTBREAK (Patient taking differently: TAKE 1 TABLET BY MOUTH DAILY) 30 tablet 0  . zolpidem (AMBIEN) 10 MG tablet TAKE 1/2 TABLET BY MOUTH AT BEDTIME AS NEEDED FOR SLEEP 20 tablet 3  . cephALEXin (KEFLEX) 500 MG capsule Take 1 capsule (500 mg total) by mouth 4 (four) times daily. (Patient not taking: Reported on 06/16/2016) 40 capsule 0  . clobetasol cream (TEMOVATE) 4.01 % Apply 1 application topically 2 (two) times daily as needed.  7  . meclizine (ANTIVERT) 25 MG tablet Take 1 tablet (25 mg total) by mouth 3 (three) times daily as needed for dizziness. 30 tablet 0  . ondansetron (ZOFRAN) 4 MG tablet Take 1 tablet (4 mg total) by mouth every 6 (six) hours. (Patient taking differently: Take 4 mg by mouth every 6 (six) hours as needed for nausea or vomiting. ) 12 tablet 0  . PREVIDENT 5000 DRY MOUTH 1.1 % GEL dental gel USE ONCE DAILY  IN PLACE OF REGULAR TOOTHEPASTE  2    Results for orders placed or performed during the hospital encounter of 06/17/16 (from the past 48 hour(s))  CBC     Status: None   Collection Time: 06/17/16  3:16 PM  Result Value Ref Range   WBC 9.3 4.0 - 10.5 K/uL   RBC 4.49 3.87 - 5.11 MIL/uL   Hemoglobin 14.8 12.0 - 15.0 g/dL   HCT 42.7 36.0 - 46.0 %   MCV 95.1 78.0 - 100.0 fL   MCH 33.0 26.0 - 34.0 pg   MCHC 34.7 30.0 - 36.0 g/dL    RDW 12.8 11.5 - 15.5 %   Platelets 236 150 - 400 K/uL  Basic metabolic panel     Status: Abnormal   Collection Time: 06/17/16  3:16 PM  Result Value Ref Range   Sodium 138 135 - 145 mmol/L   Potassium 4.5 3.5 - 5.1 mmol/L   Chloride 106 101 - 111 mmol/L   CO2 23 22 - 32 mmol/L   Glucose, Bld 112 (H) 65 - 99 mg/dL   BUN 17 6 - 20 mg/dL   Creatinine, Ser 0.94 0.44 - 1.00 mg/dL   Calcium 9.7 8.9 - 10.3 mg/dL   GFR calc non Af Amer >60 >60 mL/min   GFR calc Af Amer >60 >60 mL/min    Comment: (NOTE) The eGFR has been calculated using the CKD EPI equation. This calculation has not been validated in all clinical situations. eGFR's persistently <60 mL/min signify possible Chronic Kidney Disease.    Anion gap 9 5 - 15   No results found.  ROS  Blood pressure (!) 162/76, pulse 73, temperature 98.7 F (37.1 C), temperature source Oral, resp. rate 18, height '5\' 4"'$  (1.626 m), weight 74.8 kg (165 lb), SpO2 100 %. Physical Exam right elbow fracture ecchymosis swelling and no evidence of radial nerve injury. Chest comminuted radial head and neck fracture. We'll plan for reconstructive efforts.  She has intact pulses no compartment syndrome no evidence of dystrophy or infection.  Opposite extremity is neurovascularly intact.  The patient is alert and oriented in no acute distress. The patient complains of pain in the affected upper extremity.  The patient is noted to have a normal HEENT exam. Lung fields show equal chest expansion and no shortness of breath. Abdomen exam is nontender without distention. Lower extremity examination does not show any fracture dislocation or blood clot symptoms. Pelvis is stable and the neck and back are stable and nontender.   Assessment/Plan We are planning surgery for your upper extremity. The risk and benefits of surgery to include risk of bleeding, infection, anesthesia,  damage to normal structures and failure of the surgery to accomplish its  intended goals of relieving symptoms and restoring function have been discussed in detail. With this in mind we plan to proceed. I have specifically discussed with the patient the pre-and postoperative regime and the dos and don'ts and risk and benefits in great detail. Risk and benefits of surgery also include risk of dystrophy(CRPS), chronic nerve pain, failure of the healing process to go onto completion and other inherent risks of surgery The relavent the pathophysiology of the disease/injury process, as well as the alternatives for treatment and postoperative course of action has been discussed in great detail with the patient who desires to proceed.  We will do everything in our power to help you (the patient) restore function to the upper extremity. It is a pleasure to see this  patient today. We will plan for reconstructive efforts about the right elbow. I reviewed her CT scan. I reviewed all issues. I'll plan for open reduction internal fixation versus radial head arthroplasty. Certainly given the comminution we are leaning towards the arthroplasty. She understands risk and benefits of we'll move forward accordingly. Paulene Floor, MD 06/17/2016, 5:09 PM

## 2016-06-17 NOTE — Progress Notes (Signed)
   06/17/16 1800  Clinical Encounter Type  Visited With Patient;Health care provider  Visit Type Spiritual support  Referral From Nurse  Consult/Referral To Chaplain  Spiritual Encounters  Spiritual Needs Prayer;Emotional  Stress Factors  Patient Stress Factors Family relationships;Loss of control    Patient claustrophobic and having anxiety prior to surgery. Chaplain prayed with patient and provided guided meditation, ministry of presence and emotional support. Carmel Waddington L. Volanda Napoleon, MDiv

## 2016-06-18 ENCOUNTER — Encounter (HOSPITAL_COMMUNITY): Payer: Self-pay | Admitting: General Practice

## 2016-06-18 DIAGNOSIS — S52131A Displaced fracture of neck of right radius, initial encounter for closed fracture: Secondary | ICD-10-CM | POA: Diagnosis not present

## 2016-06-18 MED ORDER — ALPRAZOLAM 0.25 MG PO TABS: ORAL_TABLET | ORAL | 0 refills | Status: DC

## 2016-06-18 NOTE — Discharge Summary (Signed)
Physician Discharge Summary  Patient ID: Cynthia Peters MRN: [id number] DOB/AGE: 1950-02-18 66 y.o.  Admit date: 06/17/2016 Discharge date: 06/18/16  Admission Diagnoses: Communited right radial head fracture Past Medical History:  Diagnosis Date  . Anemia    history in the past, not on a regular basis  . Arthritis   . Bronchitis   . Cataracts, bilateral    immature  . Claustrophobia    takes Valium daily as needed  . Complication of anesthesia    2013 SURGERY vocal cord bothering pt, used smaller ent tube after 2 hernia repair, no problem after . " I woke up during my MRI and colonoscopy."       . Depression    takes Wellbutrin daily  . Dry eye   . Dry mouth   . GERD (gastroesophageal reflux disease)    takes Omeprazole daily   . H/O hiatal hernia   . Headache    migraines with flashing lights   . History of blood clots 40 yrs    leg   . History of echocardiogram 2013   a. Echo (05/2011): Mild LVH, EF 65-70%, no WMA, Gr 1 DD, mild AI.  Marland Kitchen History of MRSA infection   . History of staph infection   . History of stress test 2010   a. ETT-Echo in 2010 was normal  . History of TIA (transient ischemic attack) 2013   a. Carotid US (05/2011): No ICA stenosis, pt unsure of this  . Hyperlipidemia    takes Simvastatin daily   . Hypertension    takes  Losartan daily  . IBS (irritable bowel syndrome)    takes Bentyl daily as needed  . Insomnia    takes Ambien nightly  . Interstitial cystitis   . Lichen planus   . Mild asthma    Albuterol inhaler as needed  . Palpitations    Event monitor in 2007 with rare PVCs // takes Metoprolol daily  . PVC (premature ventricular contraction)   . Seasonal allergies    takes Claritin daily as needed.Takes Singulair daily as needed.  Marland Kitchen Urethral stricture   . Vertigo    takes Meclizine daily as needed    Discharge Diagnoses:  Active Problems:   Right radial head fracture   Surgeries: Procedure(s): right radial head  arthroplasty with ligament repair, reconstruciton on 06/17/2016    Consultants: none  Discharged Condition: Improved  Hospital Course: Cynthia Peters is an 66 y.o. female who was admitted 06/17/2016 with a chief complaint of No chief complaint on file. , and found to have a diagnosis of Communited right radial head fracture.  They were brought to the operating room on 06/17/2016 and underwent Procedure(s): right radial head arthroplasty with ligament repair, reconstruction. Post op day 1 the patient was awake, alert and oriented. Pain fairly controlled with po meds. Patient states right knee is tender after fall. She has a history chronic bilateral knee pain and has seen rheumatology in past with prior injections for OA. Patient with history of claustrophobia, thus, xanax will be  Dispensed. Examination shows splint is intact no signs of infection. Digital rom intact, sensation and refill intact.Right knee exam shows mild effusion, non ecchymosis or edema, no instability, crepitance with patellofemoral compression noted bilaterally. SLR intact. Discussed with patient all d/c instructions at length. Patient will place knee support to right knee at home. We will perform radiographs at office if still symptomatic upon f/u.  They were given perioperative antibiotics: Anti-infectives    Start  Dose/Rate Route Frequency Ordered Stop   06/18/16 0600  ceFAZolin (ANCEF) IVPB 1 g/50 mL premix     1 g 100 mL/hr over 30 Minutes Intravenous Every 8 hours 06/17/16 2123     06/17/16 2200  ceFAZolin (ANCEF) IVPB 1 g/50 mL premix     1 g 100 mL/hr over 30 Minutes Intravenous NOW 06/17/16 2123 06/18/16 0110   06/17/16 1500  ceFAZolin (ANCEF) IVPB 2g/100 mL premix     2 g 200 mL/hr over 30 Minutes Intravenous On call to O.R. 06/17/16 1449 06/17/16 1745   06/17/16 1453  ceFAZolin (ANCEF) 2-4 GM/100ML-% IVPB    Comments:  Cynthia Peters   : cabinet override      06/17/16 1453 06/17/16 1745    .  They were  given sequential compression devices, early ambulation, and will begin full dose aspirin and perform calf pumps for DVT prophylaxis.  Recent vital signs: Patient Vitals for the past 24 hrs:  BP Temp Temp src Pulse Resp SpO2 Height Weight  06/18/16 0404 (!) 107/57 98.3 F (36.8 C) Oral (!) 58 18 100 % - -  06/18/16 0008 117/80 97.6 F (36.4 C) Oral (!) 58 18 100 % - -  06/17/16 2134 (!) 143/57 98.4 F (36.9 C) Oral 66 18 99 % - -  06/17/16 2124 - - - - - 100 % - -  06/17/16 2040 (!) 140/57 98 F (36.7 C) - (!) 58 12 100 % - -  06/17/16 2022 (!) 129/54 - - 63 14 100 % - -  06/17/16 2007 136/65 - - - 16 100 % - -  06/17/16 1952 (!) 159/59 - - 64 14 100 % - -  06/17/16 1935 (!) 148/81 - - 66 (!) 6 100 % - -  06/17/16 1920 (!) 145/71 - - 70 10 96 % - -  06/17/16 1905 (!) 145/65 - - 77 (!) 6 100 % - -  06/17/16 1851 137/68 97 F (36.1 C) - 74 (!) 9 100 % - -  06/17/16 1710 - - - 80 13 97 % - -  06/17/16 1705 - - - 78 10 99 % - -  06/17/16 1700 - - - 75 12 100 % - -  06/17/16 1655 - - - 71 10 100 % - -  06/17/16 1459 (!) 162/76 98.7 F (37.1 C) Oral 73 18 100 % 5\' 4"  (1.626 m) 74.8 kg (165 lb)  .  Recent laboratory studies: No results found.  Discharge Medications:  See D/C instructions  Diagnostic Studies: Ct Elbow Right Wo Contrast  Result Date: 06/13/2016 CLINICAL DATA:  Evaluate radial head fracture. [place] and tripped 2 days ago. EXAM: CT OF THE LOWER RIGHT EXTREMITY WITHOUT CONTRAST TECHNIQUE: Multidetector CT imaging of the right lower extremity was performed according to the standard protocol. COMPARISON:  None. FINDINGS: Complex comminuted intra-articular fracture involving the radial head. There is a T-shaped fracture of the articular surface. The longitudinal component splits the radial head almost in half but no significant displacement. Minimal depression of the radial component. The horizontal component is displaced and rotated posteriorly to the capitellum. This involves  approximately union 30% of the articular surface. No other fractures are identified. There is an associated moderate-sized joint effusion. IMPRESSION: Comminuted and displaced intra-articular fracture of the radial head. Please see 3D images. Electronically Signed   By: Marijo Sanes M.D.   On: 06/13/2016 16:02   Ct 3d Independent Darreld Mclean  Result Date: 06/13/2016 CLINICAL DATA:  Evaluate  radial head fracture. [place] and tripped 2 days ago. EXAM: CT OF THE LOWER RIGHT EXTREMITY WITHOUT CONTRAST TECHNIQUE: Multidetector CT imaging of the right lower extremity was performed according to the standard protocol. COMPARISON:  None. FINDINGS: Complex comminuted intra-articular fracture involving the radial head. There is a T-shaped fracture of the articular surface. The longitudinal component splits the radial head almost in half but no significant displacement. Minimal depression of the radial component. The horizontal component is displaced and rotated posteriorly to the capitellum. This involves approximately union 30% of the articular surface. No other fractures are identified. There is an associated moderate-sized joint effusion. IMPRESSION: Comminuted and displaced intra-articular fracture of the radial head. Please see 3D images. Electronically Signed   By: Marijo Sanes M.D.   On: 06/13/2016 16:02    They benefited maximally from their hospital stay and there were no complications.     Disposition: 01-Home or Self Care Discharge Instructions    Call MD / Call 911    Complete by:  As directed    If you experience chest pain or shortness of breath, CALL 911 and be transported to the hospital emergency room.  If you develope a fever above 101 F, pus (white drainage) or increased drainage or redness at the wound, or calf pain, call your surgeon's office.   Constipation Prevention    Complete by:  As directed    Drink plenty of fluids.  Prune juice may be helpful.  You may use a stool softener, such as Colace  (over the counter) 100 mg twice a day.  Use MiraLax (over the counter) for constipation as needed.   Diet - low sodium heart healthy    Complete by:  As directed    Discharge instructions    Complete by:  As directed    We recommend that you to take vitamin C 1000 mg a day to promote healing. We also recommend that if you require  pain medicine that you take a stool softener to prevent constipation as most pain medicines will have constipation side effects. We recommend either Peri-Colace or Senokot and recommend that you also consider adding MiraLAX as well to prevent the constipation affects from pain medicine if you are required to use them. These medicines are over the counter and may be purchased at a local pharmacy. A cup of yogurt and a probiotic can also be helpful during the recovery process as the medicines can disrupt your intestinal environment. Keep bandage clean and dry.  Call for any problems.  No smoking.  Criteria for driving a car: you should be off your pain medicine for 7-8 hours, able to drive one handed(confident), thinking clearly and feeling able in your judgement to drive. Continue elevation as it will decrease swelling.  If instructed by MD move your fingers within the confines of the bandage/splint.  Use ice if instructed by your MD. Call immediately for any sudden loss of feeling in your hand/arm or change in functional abilities of the extremity. Please take aspirin 325 mg daily and perform calf pumps as instructed 20 to 30 times while in a sedentary position.   Increase activity slowly as tolerated    Complete by:  As directed      Follow-up Information    Roseanne Kaufman, MD. Schedule an appointment as soon as possible for a visit in 2 week(s).   Specialty:  Orthopedic Surgery Why:  our office will call you with appointment date and time Contact information: [address]  [address] [place] [address] [phone]            Signed: Ivan Croft 06/18/2016, 8:38 AM

## 2016-06-18 NOTE — Discharge Instructions (Signed)
Keep bandage clean and dry.  Call for any problems.  No smoking.  Criteria for driving a car: you should be off your pain medicine for 7-8 hours, able to drive one handed(confident), thinking clearly and feeling able in your judgement to drive. Continue elevation as it will decrease swelling.  If instructed by MD move your fingers within the confines of the bandage/splint.  Use ice if instructed by your MD. Call immediately for any sudden loss of feeling in your hand/arm or change in functional abilities of the extremity. We recommend that you to take vitamin C 1000 mg a day to promote healing. We also recommend that if you require  pain medicine that you take a stool softener to prevent constipation as most pain medicines will have constipation side effects. We recommend either Peri-Colace or Senokot and recommend that you also consider adding MiraLAX as well to prevent the constipation affects from pain medicine if you are required to use them. These medicines are over the counter and may be purchased at a local pharmacy. A cup of yogurt and a probiotic can also be helpful during the recovery process as the medicines can disrupt your intestinal environment. Please take aspirin 325 mg daily and perform calf pumps 20 to 30 times hourly when awake.

## 2016-06-18 NOTE — Progress Notes (Signed)
Patient discharged home to self care and 3-1 bedside commode. S L d/c. A VS and discharge instruction package given .Patient and two sons demonstrated good understanding. Condition at discharge is stable.

## 2016-06-18 NOTE — Progress Notes (Signed)
Pt arrived to unit from PACU on stretcher, escorted by PACU RN. Pt was assisted to bed by staff. Pt was educated on the Patient Safety Plan, was given yellow socks and bracelet, pt verbalized understanding. Pt was in no sign of distress. Pt was oriented to nursing unit and call bell system. Received bedside report in pt's room from PACU RN. Will continue to monitor pt.

## 2016-06-18 NOTE — Evaluation (Signed)
Physical Therapy Evaluation and Discharge Patient Details Name: Cynthia Peters MRN: [id number] DOB: Jan 04, 1951 Today's Date: 06/18/2016   History of Present Illness  SP radial head Aplasty with LUCL repair rt elbow  Clinical Impression  Patient evaluated by Physical Therapy with no further acute PT needs identified. All education has been completed and the patient has no further questions. Pt is supervision for bed mobility, transfers, ambulation of 150 feet and ascend/descend of 14 stairs with handrail on left.  See below for any follow-up Physical Therapy or equipment needs. PT is signing off. Thank you for this referral.     Follow Up Recommendations No PT follow up    Equipment Recommendations  None recommended by PT    Recommendations for Other Services       Precautions / Restrictions Precautions Precautions: Fall Restrictions Weight Bearing Restrictions: No      Mobility  Bed Mobility Overal bed mobility: Needs Assistance Bed Mobility: Supine to Sit     Supine to sit: Supervision;HOB elevated     General bed mobility comments: Pt requires increased time due to pain  Transfers Overall transfer level: Needs assistance   Transfers: Sit to/from Stand Sit to Stand: Supervision         General transfer comment: Min gaurd due to lethargy with pain meds. Feel pt would do fine normally  Ambulation/Gait Ambulation/Gait assistance: Min guard Ambulation Distance (Feet): 150 Feet Assistive device: None Gait Pattern/deviations: WFL(Within Functional Limits) Gait velocity: slowed Gait velocity interpretation: Below normal speed for age/gender General Gait Details: pt had slow steady cadence with gait able to compensate for R UE in sling with decreased shoulder rotation and hip rotation  Stairs Stairs: Yes Stairs assistance: Supervision Stair Management: One rail Left Number of Stairs: 14 General stair comments: slow, steady step through gait ascending and  step to pattern descending stairs due to bilateral knee OA  Wheelchair Mobility    Modified Rankin (Stroke Patients Only)       Balance Overall balance assessment: No apparent balance deficits (not formally assessed)                                           Pertinent Vitals/Pain Pain Assessment: 0-10 Pain Score: 9  Pain Location: R elbow Pain Descriptors / Indicators: Constant;Discomfort;Grimacing;Guarding Pain Intervention(s): Monitored during session;Limited activity within patient's tolerance;Repositioned;Ice applied  VSS    Home Living Family/patient expects to be discharged to:: Private residence Living Arrangements: Children (Has two sons and lives with one) Available Help at Discharge: Family;Available 24 hours/day (Son coming to stay for a few days; both sons with be present) Type of Home: House Home Access: Stairs to enter   Technical brewer of Steps: 3 Home Layout: Two level Home Equipment: Hand held shower head (HH shower head in tub)      Prior Function Level of Independence: Independent               Hand Dominance   Dominant Hand: Right    Extremity/Trunk Assessment   Upper Extremity Assessment Upper Extremity Assessment: Defer to OT evaluation RUE Deficits / Details: Elbow immobilized. Shoulder WLF ROM RUE: Unable to fully assess due to pain;Unable to fully assess due to immobilization RUE Coordination: decreased fine motor;decreased gross motor    Lower Extremity Assessment Lower Extremity Assessment: Overall WFL for tasks assessed    Cervical / Trunk Assessment Cervical /  Trunk Assessment: Normal  Communication   Communication: No difficulties  Cognition Arousal/Alertness: Awake/alert;Lethargic;Suspect due to medications (Pt alert intially; then issued pain meds ) Behavior During Therapy: WFL for tasks assessed/performed Overall Cognitive Status: Within Functional Limits for tasks assessed                                  General Comments: Pt alert intially; then issued pain meds and demonstrated lethargic behavior stating "the world is spinning"      General Comments General comments (skin integrity, edema, etc.): provided pt with written instructions of topics discussed in session        Assessment/Plan    PT Assessment Patent does not need any further PT services  PT Problem List         PT Treatment Interventions      PT Goals (Current goals can be found in the Care Plan section)  Acute Rehab PT Goals Patient Stated Goal: Go home     AM-PAC PT "6 Clicks" Daily Activity  Outcome Measure Difficulty turning over in bed (including adjusting bedclothes, sheets and blankets)?: Total Difficulty moving from lying on back to sitting on the side of the bed? : Total Difficulty sitting down on and standing up from a chair with arms (e.g., wheelchair, bedside commode, etc,.)?: A Little Help needed moving to and from a bed to chair (including a wheelchair)?: A Little Help needed walking in hospital room?: None Help needed climbing 3-5 steps with a railing? : None 6 Click Score: 16    End of Session Equipment Utilized During Treatment: Gait belt Activity Tolerance: Patient tolerated treatment well Patient left: in chair;with call bell/phone within reach Nurse Communication: Mobility status PT Visit Diagnosis: Other abnormalities of gait and mobility (R26.89);Pain    Time: 7673-4193 PT Time Calculation (min) (ACUTE ONLY): 20 min   Charges:   PT Evaluation $PT Eval Low Complexity: 1 Procedure     PT G Codes:        Loula Marcella B. Migdalia Dk PT, DPT Acute Rehabilitation  [phone] Pager [phone]    [place] 06/18/2016, 3:48 PM

## 2016-06-18 NOTE — Evaluation (Signed)
Occupational Therapy Evaluation Patient Details Name: Cynthia Peters MRN: [id number] DOB: May 14, 1950 Today's Date: 06/18/2016    History of Present Illness SP radial head Aplasty with LUCL repair rt elbow.    Clinical Impression   PTA, pt was independent and living with her son. Currently, pt requires Min-Mod A for ADLs and Min guard for functional mobility and transfers due to some lethargy with medication given at beginning of session. Educated pt on exercises, UB ADLs, transfers, positioning, and icing. Provided pt with written instructions on all information and discussed use of 3N1 for bathing at tub. Provided all education in preparation for dc today. Will follow to increase independence with ADLs while admitted. Recommend pt dc home once 24 hour supervision to increase safety.     Follow Up Recommendations  No OT follow up;Supervision/Assistance - 24 hour    Equipment Recommendations  3 in 1 bedside commode    Recommendations for Other Services       Precautions / Restrictions Precautions Precautions: Fall Restrictions Weight Bearing Restrictions: No      Mobility Bed Mobility Overal bed mobility: Needs Assistance Bed Mobility: Supine to Sit     Supine to sit: Supervision;HOB elevated     General bed mobility comments: Pt requires increased time due to pain  Transfers Overall transfer level: Needs assistance   Transfers: Sit to/from Stand Sit to Stand: Min guard         General transfer comment: Min gaurd due to lethargy with pain meds. Feel pt would do fine normally    Balance Overall balance assessment: No apparent balance deficits (not formally assessed)                                         ADL either performed or assessed with clinical judgement   ADL Overall ADL's : Needs assistance/impaired Eating/Feeding: Set up;Sitting   Grooming: Min guard;Standing   Upper Body Bathing: Minimal assistance;Sitting   Lower Body  Bathing: Minimal assistance;Sit to/from stand   Upper Body Dressing : Moderate assistance;Sitting;Cueing for UE precautions;Cueing for sequencing   Lower Body Dressing: Minimal assistance;Sit to/from stand   Toilet Transfer: Min guard;Ambulation;Regular Toilet;Cueing for sequencing   Toileting- Clothing Manipulation and Hygiene: Min Child psychotherapist Details (indicate cue type and reason): Discussed using 3N1 for tub or shower transfer Functional mobility during ADLs: Min guard (Min guard due to pain meds) General ADL Comments: Educated pt on UB ADLs, excercises, and transfers. Pt demosntrated understanding. However, wrote everything down for pt due to lethrigc behaivior with pain meds given during session     Vision         Perception     Praxis      Pertinent Vitals/Pain Pain Location: R elbow Pain Descriptors / Indicators: Constant;Discomfort;Grimacing;Guarding     Hand Dominance Right   Extremity/Trunk Assessment Upper Extremity Assessment Upper Extremity Assessment: RUE deficits/detail RUE Deficits / Details: Elbow immobilized. Shoulder WLF ROM RUE: Unable to fully assess due to pain;Unable to fully assess due to immobilization RUE Coordination: decreased fine motor;decreased gross motor       Cervical / Trunk Assessment Cervical / Trunk Assessment: Normal   Communication Communication Communication: No difficulties   Cognition Arousal/Alertness: Awake/alert;Lethargic;Suspect due to medications (Pt alert intially; then issued pain meds ) Behavior During Therapy: [facility] for tasks assessed/performed Overall Cognitive Status: Within Functional Limits for tasks assessed  General Comments: Pt alert intially; then issued pain meds and demonstrated lethargic behavior stating "the world is spinning"   General Comments  provided pt with written instructions of topics discussed in session    Exercises      Shoulder Instructions      Home Living Family/patient expects to be discharged to:: Private residence Living Arrangements: Children (Has two sons and lives with one) Available Help at Discharge: Family;Available 24 hours/day (Son coming to stay for a few days; both sons with be present) Type of Home: House Home Access: Stairs to enter [employer] of Steps: 3   Home Layout: Two level Alternate Level Stairs-Number of Steps: 15 Alternate Level Stairs-Rails: Left Bathroom Shower/Tub: Walk-in shower;Tub/shower unit   Bathroom Toilet: Standard     Home Equipment: Hand held shower head (HH shower head in tub)          Prior Functioning/Environment Level of Independence: Independent                 OT Problem List: Decreased range of motion;Decreased knowledge of use of DME or AE;Decreased knowledge of precautions;Pain;Impaired UE functional use      OT Treatment/Interventions:      OT Goals(Current goals can be found in the care plan section) Acute Rehab OT Goals Patient Stated Goal: Go home OT Goal Formulation: With patient Time For Goal Achievement: 07/02/16 Potential to Achieve Goals: Good  OT Frequency:     Barriers to D/C:            Co-evaluation              AM-PAC PT "6 Clicks" Daily Activity     Outcome Measure Help from another person eating meals?: None Help from another person taking care of personal grooming?: A Little Help from another person toileting, which includes using toliet, bedpan, or urinal?: A Little Help from another person bathing (including washing, rinsing, drying)?: A Little Help from another person to put on and taking off regular upper body clothing?: A Lot Help from another person to put on and taking off regular lower body clothing?: A Little 6 Click Score: 18   End of Session Equipment Utilized During Treatment: Other (comment) (Sling) Nurse Communication: Mobility status;Patient requests pain meds  Activity  Tolerance: Patient tolerated treatment well;Patient limited by lethargy Patient left: in chair;with call bell/phone within reach  OT Visit Diagnosis: Pain;History of falling (Z91.81) Pain - Right/Left: Right                Time: 9518-8416 OT Time Calculation (min): 45 min Charges:  OT General Charges $OT Visit: 1 Procedure OT Evaluation $OT Eval Low Complexity: 1 Procedure OT Treatments $Self Care/Home Management : 23-37 mins G-Codes: OT G-codes **NOT FOR INPATIENT CLASS** Functional Assessment Tool Used: Clinical judgement Functional Limitation: Self care Self Care Current Status (S0630): At least 1 percent but less than 20 percent impaired, limited or restricted Self Care Goal Status (Z6010): At least 1 percent but less than 20 percent impaired, limited or restricted Self Care Discharge Status [phone]): At least 1 percent but less than 20 percent impaired, limited or restricted   [facility], OTR/[employer] 06/18/2016, 3:05 PM

## 2016-06-18 NOTE — Progress Notes (Signed)
Patient reporting increase in pressure feeling in right arm, and new onset tingling in right hand and fingers. Hand feels cool to touch, but it was this way this morning as well when there was no tingling. Brisk capillary refill. Called ortho tech to come assess compression level of splint, elevated patient's arm on pillows to level of her heart, and administered 10mg  oxycodone for pain.

## 2016-06-18 NOTE — Care Management (Signed)
Case manager requested 3in1 from [place], patient has no other DME or Home Health needs.

## 2016-06-18 NOTE — Op Note (Signed)
Cynthia Peters, SIEBEL NO.:  [id number]  MEDICAL RECORD NO.:  [id number]  LOCATION:  [facility]                         FACILITY:  [facility]  PHYSICIAN:  Satira Anis. Caroline Longie, M.D.DATE OF BIRTH:  [birth year]  DATE OF PROCEDURE:  06/17/2016 DATE OF DISCHARGE:                              OPERATIVE REPORT   PREOPERATIVE DIAGNOSIS:  Comminuted complex intra-articular radial head and neck fracture with displacement and associated instability.  POSTOPERATIVE DIAGNOSIS:  Comminuted complex intra-articular radial head and neck fracture with displacement and associated instability.  PROCEDURE: 1. Radial head excision and placement of a Biomet radial head     arthroplasty, right elbow.  This was a 22 x 14 head with a 7 mm     stem. 2. Stress radiography/radiographic examination of the right elbow 4-     view series. 3. Lateral ulnar collateral ligament repair, right elbow.  SURGEON:  Satira Anis. Amedeo Plenty, MD.  ASSISTANT:  Avelina Laine, PA-C.  COMPLICATIONS:  None.  ANESTHESIA:  General with preoperative block.  TOURNIQUET TIME:  Less than an hour.  INDICATIONS:  A 66 year old female with comminuted complex intra- articular proximal radius fracture.  The patient has a CT scan showing marked displacement and a comminuted nature to the radial head.  I have discussed the risks and benefits, do's and don'ts, timeframe, duration of recovery, I would recommend radial head arthroplasty, possible ORIF if the fragments lined up well.  I would certainly lean her towards arthroplasty if she knows.  I performed a thorough preoperative examination including radiographs and CT scan.  OPERATION IN DETAIL:  The patient was seen by myself and Anesthesia, taken to the operative theater and underwent smooth induction of general anesthetic.  Preoperatively, a block was placed.  She was prepped and draped with 2 Hibiclens scrubs by myself, followed by 10 minutes surgical Betadine scrub and  paint by Avelina Laine, PA-C.  Outline marks were made visually.  Sterile field was secured.  SCDs were applied and the patient then underwent a very careful and cautious approach to the elbow with tourniquet insufflation after time-out being called.  A lateral incision was made.  Dissection was carried out.  Interval between the ECU and anconeus was created.  I sculpted the incision to the area with a radial head could be accessed.  With the arm pronated at all times, I then accessed the joint and removed any significant hematoma.  We then cleaned out the joint and noticed 4 to 5 fragments and at this time, made a decision for radial head arthroplasty as I suspected we would.  I made a saw cut very carefully with Bennett retractors being placed, made sure the bevel was perfect and then sized her to the above-mentioned Biomet sizing guides.  Following sizing, we then implanted the implant.  This was a 7 stem, a 14 x 22 head.  A screw was placed and locked the construct in and small amount of bone wax placed over this.  Following this, I x-rayed her. Four-view radiographic series was taken, which looked excellent.  There was no instability, no signs of infection, dystrophy, or vascular compromise preoperatively and certainly the intraoperative findings detailed good looking features throughout.  At this  juncture, we then performed final copy x-rays for documentation and performed a lateral ulnar collateral ligament repair with 3-0 FiberWire.  Tourniquet was deflated.  Excellent range of motion was noted and good stability afforded.  The joint was not over stuffed and it looked quite well.  Following this, we then closed the skin layers with 3-0 Vicryl, followed by 3-0 Prolene.  She was placed in a long-arm splint.  She will be admitted for IV antibiotics, general postop measures including elevation, pain management, and other issues, she is a bit claustrophobic.  She will likely  need some anxiolytics to help her with this of course.  I have discussed her all issues, do's and don'ts, etc. Should any problems occur, we will be immediately available.  It was a pleasure to see her today.     Satira Anis. Amedeo Plenty, M.D.     [facility]  D:  06/17/2016  T:  06/18/2016  Job:  [id number]

## 2016-06-20 NOTE — Progress Notes (Signed)
PT Addendum G codes left off of original note. Please see below   2016-06-23 1600  PT G-Codes **NOT FOR INPATIENT CLASS**  Functional Assessment Tool Used AM-PAC 6 Clicks Basic Mobility  Functional Limitation Mobility: Walking and moving around  Mobility: Walking and Moving Around Current Status [phone]) CK  Mobility: Walking and Moving Around Goal Status (B3383) Simona Huh B. Migdalia Dk PT, DPT Acute Rehabilitation  [phone] Pager [phone]

## 2016-06-29 ENCOUNTER — Other Ambulatory Visit: Payer: Self-pay | Admitting: Physician Assistant

## 2016-06-29 DIAGNOSIS — I493 Ventricular premature depolarization: Secondary | ICD-10-CM

## 2016-07-01 ENCOUNTER — Telehealth: Payer: Self-pay

## 2016-07-01 NOTE — Telephone Encounter (Signed)
This is Dr. Jordan pt. °

## 2016-07-01 NOTE — Telephone Encounter (Signed)
Received surgical clearance from [facility]Dr.Jordan cleared patient for upcoming surgery.Form faxed back to fax # [phone].

## 2016-07-01 NOTE — Telephone Encounter (Signed)
Rx(s) sent to pharmacy electronically.  

## 2016-07-17 ENCOUNTER — Other Ambulatory Visit: Payer: Self-pay | Admitting: Cardiology

## 2016-07-17 DIAGNOSIS — I493 Ventricular premature depolarization: Secondary | ICD-10-CM

## 2016-07-31 ENCOUNTER — Encounter: Payer: Self-pay | Admitting: Cardiology

## 2016-08-01 ENCOUNTER — Other Ambulatory Visit: Payer: Self-pay

## 2016-08-01 DIAGNOSIS — I493 Ventricular premature depolarization: Secondary | ICD-10-CM

## 2016-08-01 MED ORDER — POTASSIUM CHLORIDE CRYS ER 20 MEQ PO TBCR: 30.0000 meq | EXTENDED_RELEASE_TABLET | Freq: Every day | ORAL | 0 refills | Status: DC

## 2016-08-15 ENCOUNTER — Encounter: Payer: Self-pay | Admitting: Cardiology

## 2016-08-15 DIAGNOSIS — I493 Ventricular premature depolarization: Secondary | ICD-10-CM

## 2016-08-18 MED ORDER — POTASSIUM CHLORIDE CRYS ER 20 MEQ PO TBCR: 30.0000 meq | EXTENDED_RELEASE_TABLET | Freq: Every day | ORAL | 0 refills | Status: DC

## 2016-08-24 ENCOUNTER — Other Ambulatory Visit: Payer: Self-pay | Admitting: Cardiology

## 2016-08-29 ENCOUNTER — Encounter: Payer: Self-pay | Admitting: Cardiology

## 2016-08-29 ENCOUNTER — Ambulatory Visit (INDEPENDENT_AMBULATORY_CARE_PROVIDER_SITE_OTHER): Payer: BC Managed Care – PPO | Admitting: Cardiology

## 2016-08-29 VITALS — BP 136/78 | HR 67 | Ht 64.0 in | Wt 169.8 lb

## 2016-08-29 DIAGNOSIS — I493 Ventricular premature depolarization: Secondary | ICD-10-CM

## 2016-08-29 DIAGNOSIS — I351 Nonrheumatic aortic (valve) insufficiency: Secondary | ICD-10-CM | POA: Diagnosis not present

## 2016-08-29 MED ORDER — POTASSIUM CHLORIDE CRYS ER 20 MEQ PO TBCR: 30.0000 meq | EXTENDED_RELEASE_TABLET | Freq: Every day | ORAL | 3 refills | Status: DC

## 2016-08-29 MED ORDER — METOPROLOL SUCCINATE ER 100 MG PO TB24: ORAL_TABLET | ORAL | 3 refills | Status: DC

## 2016-08-29 NOTE — Patient Instructions (Signed)
Medication Instructions:  Your physician recommends that you continue on your current medications as directed. Please refer to the Current Medication list given to you today.   Labwork: TODAY - basic metabolic panel   Testing/Procedures: Your physician has requested that you have an echocardiogram. Echocardiography is a painless test that uses sound waves to create images of your heart. It provides your doctor with information about the size and shape of your heart and how well your heart's chambers and valves are working. This procedure takes approximately one hour. There are no restrictions for this procedure.   Follow-Up: Your physician wants you to follow-up in: 1 year with Dr. Martinique.  You will receive a reminder letter in the mail two months in advance. If you don't receive a letter, please call our office to schedule the follow-up appointment.   If you need a refill on your cardiac medications before your next appointment, please call your pharmacy.   Thank you for choosing [facility]! Christen Bame, RN [phone]

## 2016-08-29 NOTE — Progress Notes (Signed)
08/29/2016 [place]   Jan 28, 1951  [id number]  Primary Physician Aletha Halim., PA-C Primary Cardiologist: Dr. Martinique   Reason for Visit/CC: 1 year f/u for PVCs  HPI:  Cynthia Outten Hornbuckleis a 67 y.o.femalewith a hx of palpitations, HTN, HL, GERD. Event monitor in 2007 demonstrated rare PVCs. ETT-Echo in 2010 was normal. She was evaluated by Dr. Peter Martinique in 08/2010 for palpitations. Symptoms were felt to be due to PVCs and prn beta blocker was recommended. She was admitted with a TIA in 05/2011. Echo (05/2011): Mild LVH, EF 65-70%, no WMA, Gr 1 DD, mild AI.Carotid US (05/2011): No ICA stenosis.  She is here today for 1 year f/u. She reports full compliance with metoprolol and denies any recurrent palpitations. She is on metoprolol XL 100 mg in the morning and 50 mg in the evening. EKG shows NSR with HR at 67 bpm. BP is well controlled at 136/78. She also denies CP and dyspnea. No LEE, orthopnea and PND. She has also been on daily HCTZ given h/o hypokalemia and will need a f/u BMP today. She is also requesting refills of her Kdur.   Current Meds  Medication Sig  . acetaminophen (TYLENOL) 500 MG tablet Take 1,000 mg by mouth every 6 (six) hours as needed (pain).   Marland Kitchen albuterol (PROVENTIL HFA;VENTOLIN HFA) 108 (90 BASE) MCG/ACT inhaler Inhale 2 puffs into the lungs every 6 (six) hours as needed for wheezing or shortness of breath.  . ALPRAZolam (XANAX) 0.25 MG tablet Take I pill every 6 hours as needed for anxiety  . amoxicillin-clavulanate (AUGMENTIN) 875-125 MG tablet Take 1 tablet by mouth 2 (two) times daily. for 10 days  . BIOTIN PO Take 2,000 mcg by mouth daily.  Marland Kitchen buPROPion (WELLBUTRIN XL) 300 MG 24 hr tablet Take 300 mg by mouth daily.   . Calcium Carb-Cholecalciferol (CALCIUM CARBONATE-VITAMIN D3 PO) Take 1 tablet by mouth daily.  . Cholecalciferol (VITAMIN D) 2000 units CAPS Take 2,000 Units by mouth daily.  . clobetasol cream (TEMOVATE) 5.36 % Apply 1  application topically 2 (two) times daily as needed.  . Coenzyme Q10 (CO Q 10) 100 MG CAPS Take 100 mg by mouth daily.   . CVS VITAMIN C 1000 MG tablet Take 1,000 mg by mouth daily.  . cycloSPORINE (RESTASIS) 0.05 % ophthalmic emulsion Place 1 drop into both eyes 2 (two) times daily.  Marland Kitchen dexamethasone (DECADRON) 0.5 MG/5ML solution TAKE 10 ML BY MOUTH daily as needed FOR LICHENS  (AS A MOUTHRINSE)  . dicyclomine (BENTYL) 10 MG capsule Take 10 mg by mouth every 4 (four) hours as needed (for abdominal cramps).   . Evening Primrose Oil 1000 MG CAPS Take 1,000 mg by mouth 2 (two) times daily.   Marland Kitchen Fexofenadine HCl (ALLEGRA PO) Take 1 tablet by mouth daily as needed (ALLERGIES).  . Flaxseed, Linseed, 1000 MG CAPS Take 1,000 mg by mouth 2 (two) times daily.   . fluticasone (FLONASE) 50 MCG/ACT nasal spray Place 2 sprays into both nostrils daily as needed for allergies or rhinitis.   . hydrochlorothiazide 25 MG tablet Take 25 mg by mouth daily.   Marland Kitchen HYDROCORTISONE ACE, RECTAL, 30 MG SUPP Place 30 mg rectally 2 (two) times daily as needed for hemorrhoids.  Marland Kitchen ibuprofen (ADVIL,MOTRIN) 200 MG tablet Take 400-600 mg by mouth 2 (two) times daily as needed (pain).   . indomethacin (INDOCIN SR) 75 MG CR capsule Take 75 mg by mouth daily. with food  . lidocaine (XYLOCAINE) 5 %  ointment Apply 1 application topically 3 (three) times daily as needed for mild pain or moderate pain.  Marland Kitchen losartan (COZAAR) 100 MG tablet TAKE 1 TABLET DAILY  . meclizine (ANTIVERT) 25 MG tablet Take 1 tablet (25 mg total) by mouth 3 (three) times daily as needed for dizziness.  . metoprolol succinate (TOPROL-XL) 100 MG 24 hr tablet Take 1 tablet (100 mg total) by mouth 2 (two) times daily. (Patient taking differently: Take 100 mg by mouth daily. )  . montelukast (SINGULAIR) 10 MG tablet Take 10 mg by mouth daily.   . Multiple Vitamin (MULTIVITAMIN WITH MINERALS) TABS Take 1 tablet by mouth daily.  Marland Kitchen nystatin (MYCOSTATIN) 100000 UNIT/ML  suspension Take 5 mLs by mouth daily as needed (FLARES, LICHEN PLANTIS).  Marland Kitchen nystatin cream (MYCOSTATIN) Apply 1 application topically 2 (two) times daily as needed for dry skin.  Marland Kitchen omeprazole (PRILOSEC) 20 MG capsule Take 20 mg by mouth daily.  . ondansetron (ZOFRAN) 4 MG tablet Take 4 mg by mouth every 8 (eight) hours as needed for nausea or vomiting.  Marland Kitchen PAZEO 0.7 % SOLN Place 1 drop into both eyes daily as needed for allergies.  . phenazopyridine (PYRIDIUM) 200 MG tablet Take 200 mg by mouth 3 (three) times daily as needed for pain [place] TRACT PAIN).  Marland Kitchen potassium chloride SA (KLOR-CON M20) 20 MEQ tablet Take 1.5 tablets (30 mEq total) by mouth daily. PLEASE CONTACT OFFICE FOR ADDITIONAL REFILLS  . PREVIDENT 5000 DRY MOUTH 1.1 % GEL dental gel USE ONCE DAILY IN PLACE OF REGULAR TOOTHEPASTE  . Probiotic Product (ALIGN PO) Take 1 capsule by mouth 2 (two) times daily.   . simvastatin (ZOCOR) 40 MG tablet Take 40 mg by mouth daily.   . valACYclovir (VALTREX) 500 MG tablet INCREASE TO TAKING 1 TABLET 2 TIMES A DAY FOR 3 DAYS WITH OUTBREAK (Patient taking differently: TAKE 1 TABLET BY MOUTH DAILY)   Allergies  Allergen Reactions  . Hydrocodone Rash  . Hydrocodone-Acetaminophen Rash  . Tramadol Palpitations   Past Medical History:  Diagnosis Date  . Anemia    history in the past, not on a regular basis  . Arthritis   . Bronchitis   . Cataracts, bilateral    immature  . Claustrophobia    takes Valium daily as needed  . Complication of anesthesia    2013 SURGERY vocal cord bothering pt, used smaller ent tube after 2 hernia repair, no problem after . " I woke up during my MRI and colonoscopy."       . Depression    takes Wellbutrin daily  . Dry eye   . Dry mouth   . GERD (gastroesophageal reflux disease)    takes Omeprazole daily   . H/O hiatal hernia   . Headache    migraines with flashing lights   . History of blood clots 40 yrs    leg   . History of echocardiogram 2013   a.  Echo (05/2011): Mild LVH, EF 65-70%, no WMA, Gr 1 DD, mild AI.  Marland Kitchen History of MRSA infection   . History of staph infection   . History of stress test 2010   a. ETT-Echo in 2010 was normal  . History of TIA (transient ischemic attack) 2013   a. Carotid US (05/2011): No ICA stenosis, pt unsure of this  . Hyperlipidemia    takes Simvastatin daily   . Hypertension    takes  Losartan daily  . IBS (irritable bowel syndrome)  takes Bentyl daily as needed  . Insomnia    takes Ambien nightly  . Interstitial cystitis   . Lichen planus   . Mild asthma    Albuterol inhaler as needed  . Palpitations    Event monitor in 2007 with rare PVCs // takes Metoprolol daily  . PVC (premature ventricular contraction)   . Seasonal allergies    takes Claritin daily as needed.Takes Singulair daily as needed.  Marland Kitchen Urethral stricture   . Vertigo    takes Meclizine daily as needed   Family History  Problem Relation Age of Onset  . Dementia Mother   . Leukemia Child   . Cirrhosis Father    Past Surgical History:  Procedure Laterality Date  . CESAREAN SECTION    . COLONOSCOPY WITH PROPOFOL N/A 08/10/2012   Procedure: COLONOSCOPY WITH PROPOFOL;  Surgeon: Garlan Fair, MD;  Location: [facility];  Service: Endoscopy;  Laterality: N/A;  . CYSTOSCOPY WITH URETHRAL DILATATION N/A 02/18/2013   Procedure: CYSTOSCOPY WITH URETHRAL DILATATION ( NO BALLOON) AND HYDRODISTENSION;  Surgeon: Alexis Frock, MD;  Location: [facility];  Service: Urology;  Laterality: N/A;  . ENDOMETRIAL ABLATION W/ HYDROTHERMABLATOR  09-27-2002  . INCISIONAL HERNIA REPAIR N/A 10/25/2014   Procedure: HERNIA REPAIR INCISIONAL;  Surgeon: Ralene Ok, MD;  Location: [place];  Service: General;  Laterality: N/A;  . INCISIONAL HERNIA REPAIR N/A 03/26/2015   Procedure: LAPAROSCOPIC INCISIONAL HERNIA;  Surgeon: Ralene Ok, MD;  Location: [place];  Service: General;  Laterality: N/A;  . INSERTION OF MESH N/A 10/25/2014    Procedure: INSERTION OF MESH;  Surgeon: Ralene Ok, MD;  Location: [place];  Service: General;  Laterality: N/A;  . LAPAROSCOPIC LYSIS OF ADHESIONS N/A 03/26/2015   Procedure: LAPAROSCOPIC LYSIS OF ADHESIONS;  Surgeon: Ralene Ok, MD;  Location: [place];  Service: General;  Laterality: N/A;  . LAPAROTOMY N/A 10/25/2014   Procedure: EXPLORATORY LAPAROTOMY;  Surgeon: Ralene Ok, MD;  Location: [place];  Service: General;  Laterality: N/A;  . LYSIS OF ADHESION N/A 10/25/2014   Procedure: LYSIS OF ADHESIONS ;  Surgeon: Ralene Ok, MD;  Location: [place];  Service: General;  Laterality: N/A;  . MUCOSAL ADVANCEMENT FLAP N/A 10/25/2014   Procedure: MUCOSAL ADVANCEMENT FLAP;  Surgeon: Ralene Ok, MD;  Location: [place];  Service: General;  Laterality: N/A;  . ORIF RADIAL FRACTURE Right 06/17/2016   Procedure: right radial head arthroplasty with ligament repair, reconstruciton;  Surgeon: Roseanne Kaufman, MD;  Location: [place];  Service: Orthopedics;  Laterality: Right;  . RADIAL HEAD ARTHROPLASTY Right 06/17/2016  . TOOTH EXTRACTION    . TRANSTHORACIC ECHOCARDIOGRAM  05-26-2011   MILD LVH/  EF 16-10%/  GRADE I DIASTOLIC DYSFUNCTION/  MILD AR//   06-20-2008  NOMRAL STRESS ECHO  . UMBILICAL HERNIA REPAIR  2013   AND REVISION THE SAME YEAR W/ MESH  . WISDOM TOOTH EXTRACTION  AGE 89   Social History   Social History  . Marital status: Divorced    Spouse name: N/A  . Number of children: 2  . Years of education: N/A   Occupational History  . teacher    Social History Main Topics  . Smoking status: Former Smoker    Packs/day: 0.25    Years: 2.00    Types: Cigarettes    Quit date: 02/04/1976  . Smokeless tobacco: Never Used  . Alcohol use Yes     Comment: occasional  . Drug use: No  . Sexual activity: Yes  Birth control/ protection: Post-menopausal   Other Topics Concern  . Not on file   Social History Narrative  . No narrative on file     Review of Systems: General:  negative for chills, fever, night sweats or weight changes.  Cardiovascular: negative for chest pain, dyspnea on exertion, edema, orthopnea, palpitations, paroxysmal nocturnal dyspnea or shortness of breath Dermatological: negative for rash Respiratory: negative for cough or wheezing Urologic: negative for hematuria Abdominal: negative for nausea, vomiting, diarrhea, bright red blood per rectum, melena, or hematemesis Neurologic: negative for visual changes, syncope, or dizziness All other systems reviewed and are otherwise negative except as noted above.   Physical Exam:  Blood pressure 136/78, pulse 67, height 5\' 4"  (1.626 m), weight 169 lb 12.8 oz (77 kg).  General appearance: alert, cooperative and no distress Neck: no carotid bruit and no JVD Lungs: clear to auscultation bilaterally Heart: regular rate and rhythm, S1, S2 normal, no murmur, click, rub or gallop Extremities: extremities normal, atraumatic, no cyanosis or edema Pulses: 2+ and symmetric Skin: Skin color, texture, turgor normal. No rashes or lesions Neurologic: Grossly normal  EKG NSR 67 bpm  -- personally reviewed   ASSESSMENT AND PLAN:   1. PVCs: well controlled with BB therapy, metoprolol XL 150 mg daily (100 mg in the am and 50 mg in the PM). She denies any breakthrough palpitations. EKG shows NSR with HR of 67 bpm. BP is well controlled. She has required daily K--dur given h/o hypokalemia. Will check BMP today. Continue metoprolol.   2. AI: 2D echo in 2013 showed mild AI. She has not had a repeat study in over 5 years. No notable murmurs heard on exam today, however will reorder to reassess status of aortic valve.   3. HTN: controlled on current regimen.   4. HLD: on statin therapy. Followed by PCP.   Follow-Up with Dr. Martinique in 1 year, or sooner if needed.   Cynthia Peters, MHS [facility] 08/29/2016 2:42 PM

## 2016-08-30 LAB — BASIC METABOLIC PANEL
BUN/Creatinine Ratio: 19 (ref 12–28)
BUN: 17 mg/dL (ref 8–27)
CO2: 26 mmol/L (ref 20–29)
Calcium: 9.8 mg/dL (ref 8.7–10.3)
Chloride: 101 mmol/L (ref 96–106)
Creatinine, Ser: 0.91 mg/dL (ref 0.57–1.00)
GFR calc Af Amer: 77 mL/min/{1.73_m2} (ref >59)
GFR calc non Af Amer: 67 mL/min/{1.73_m2} (ref >59)
Glucose: 101 mg/dL — ABNORMAL HIGH (ref 65–99)
Potassium: 4.6 mmol/L (ref 3.5–5.2)
Sodium: 139 mmol/L (ref 134–144)

## 2016-09-04 NOTE — Addendum Note (Signed)
Addended by: Velna Ochs on: 09/04/2016 11:49 AM   Modules accepted: Orders

## 2016-09-05 ENCOUNTER — Other Ambulatory Visit (HOSPITAL_COMMUNITY): Payer: BC Managed Care – PPO

## 2016-09-10 ENCOUNTER — Other Ambulatory Visit: Payer: Self-pay

## 2016-09-10 ENCOUNTER — Ambulatory Visit (HOSPITAL_COMMUNITY): Payer: BC Managed Care – PPO | Attending: Cardiology

## 2016-09-10 DIAGNOSIS — Z8673 Personal history of transient ischemic attack (TIA), and cerebral infarction without residual deficits: Secondary | ICD-10-CM | POA: Diagnosis not present

## 2016-09-10 DIAGNOSIS — I351 Nonrheumatic aortic (valve) insufficiency: Secondary | ICD-10-CM

## 2016-09-10 DIAGNOSIS — I77810 Thoracic aortic ectasia: Secondary | ICD-10-CM | POA: Diagnosis not present

## 2016-09-10 DIAGNOSIS — Z87891 Personal history of nicotine dependence: Secondary | ICD-10-CM | POA: Insufficient documentation

## 2016-09-10 DIAGNOSIS — E785 Hyperlipidemia, unspecified: Secondary | ICD-10-CM | POA: Insufficient documentation

## 2016-09-10 DIAGNOSIS — I083 Combined rheumatic disorders of mitral, aortic and tricuspid valves: Secondary | ICD-10-CM | POA: Insufficient documentation

## 2016-09-21 ENCOUNTER — Other Ambulatory Visit: Payer: Self-pay | Admitting: Cardiology

## 2016-09-22 NOTE — Telephone Encounter (Signed)
Rx(s) sent to pharmacy electronically.  

## 2016-11-02 ENCOUNTER — Emergency Department (HOSPITAL_BASED_OUTPATIENT_CLINIC_OR_DEPARTMENT_OTHER)
Admission: EM | Admit: 2016-11-02 | Discharge: 2016-11-02 | Disposition: A | Discharge status: Home / Self Care | Payer: BC Managed Care – PPO | Attending: Emergency Medicine | Admitting: Emergency Medicine

## 2016-11-02 ENCOUNTER — Encounter (HOSPITAL_BASED_OUTPATIENT_CLINIC_OR_DEPARTMENT_OTHER): Payer: Self-pay | Admitting: Adult Health

## 2016-11-02 DIAGNOSIS — Z87891 Personal history of nicotine dependence: Secondary | ICD-10-CM | POA: Diagnosis not present

## 2016-11-02 DIAGNOSIS — Z79899 Other long term (current) drug therapy: Secondary | ICD-10-CM | POA: Diagnosis not present

## 2016-11-02 DIAGNOSIS — J45909 Unspecified asthma, uncomplicated: Secondary | ICD-10-CM | POA: Diagnosis not present

## 2016-11-02 DIAGNOSIS — R1084 Generalized abdominal pain: Secondary | ICD-10-CM | POA: Diagnosis present

## 2016-11-02 DIAGNOSIS — I10 Essential (primary) hypertension: Secondary | ICD-10-CM | POA: Insufficient documentation

## 2016-11-02 LAB — URINALYSIS, ROUTINE W REFLEX MICROSCOPIC

## 2016-11-02 LAB — URINALYSIS, MICROSCOPIC (REFLEX)

## 2016-11-02 LAB — COMPREHENSIVE METABOLIC PANEL
ALT: 26 U/L (ref 14–54)
AST: 30 U/L (ref 15–41)
Albumin: 4.4 g/dL (ref 3.5–5.0)
Alkaline Phosphatase: 64 U/L (ref 38–126)
Anion gap: 6 (ref 5–15)
BUN: 19 mg/dL (ref 6–20)
CO2: 26 mmol/L (ref 22–32)
Calcium: 9.3 mg/dL (ref 8.9–10.3)
Chloride: 106 mmol/L (ref 101–111)
Creatinine, Ser: 0.95 mg/dL (ref 0.44–1.00)
GFR calc Af Amer: >60 mL/min (ref >60)
GFR calc non Af Amer: >60 mL/min (ref >60)
Glucose, Bld: 127 mg/dL — ABNORMAL HIGH (ref 65–99)
Potassium: 4.2 mmol/L (ref 3.5–5.1)
Sodium: 138 mmol/L (ref 135–145)
Total Bilirubin: 1.4 mg/dL — ABNORMAL HIGH (ref 0.3–1.2)
Total Protein: 6.7 g/dL (ref 6.5–8.1)

## 2016-11-02 LAB — CBC WITH DIFFERENTIAL/PLATELET
Basophils Absolute: 0 10*3/uL (ref 0.0–0.1)
Basophils Relative: 0 %
Eosinophils Absolute: 0.6 10*3/uL (ref 0.0–0.7)
Eosinophils Relative: 4 %
HCT: 42.6 % (ref 36.0–46.0)
Hemoglobin: 14.7 g/dL (ref 12.0–15.0)
Lymphocytes Relative: 10 %
Lymphs Abs: 1.4 10*3/uL (ref 0.7–4.0)
MCH: 33.2 pg (ref 26.0–34.0)
MCHC: 34.5 g/dL (ref 30.0–36.0)
MCV: 96.2 fL (ref 78.0–100.0)
Monocytes Absolute: 1 10*3/uL (ref 0.1–1.0)
Monocytes Relative: 7 %
Neutro Abs: 11.4 10*3/uL — ABNORMAL HIGH (ref 1.7–7.7)
Neutrophils Relative %: 79 %
Platelets: 269 10*3/uL (ref 150–400)
RBC: 4.43 MIL/uL (ref 3.87–5.11)
RDW: 12.8 % (ref 11.5–15.5)
WBC: 14.4 10*3/uL — ABNORMAL HIGH (ref 4.0–10.5)

## 2016-11-02 LAB — LIPASE, BLOOD: Lipase: 29 U/L (ref 11–51)

## 2016-11-02 MED ORDER — GI COCKTAIL ~~LOC~~
30.0000 mL | Freq: Once | ORAL | Status: DC
  Filled 2016-11-02: qty 30

## 2016-11-02 MED ORDER — ONDANSETRON 4 MG PO TBDP: ORAL_TABLET | ORAL | 0 refills | Status: DC

## 2016-11-02 MED ORDER — ONDANSETRON HCL 4 MG/2ML IJ SOLN
4.0000 mg | Freq: Once | INTRAMUSCULAR | Status: AC
  Administered 2016-11-02: 4 mg via INTRAVENOUS
  Filled 2016-11-02: qty 2

## 2016-11-02 MED ORDER — SODIUM CHLORIDE 0.9 % IV BOLUS (SEPSIS)
1000.0000 mL | Freq: Once | INTRAVENOUS | Status: AC
  Administered 2016-11-02: 1000 mL via INTRAVENOUS

## 2016-11-02 NOTE — ED Triage Notes (Signed)
PResents with bilateral upper quadrant abdominal pain, pain began at 2:30 this morning and woke her from sleep. PAin is described as sever and intermittent. Described as bad ache. Associted factors include nausea and bloating.  Denies vomiting, diarrhea.  Nothing relieves the pain.  Aggravating factors include lying flat. Hx of multiple hernia surgeries

## 2016-11-02 NOTE — Discharge Instructions (Signed)
Try imodium for diarrhea.  Return for pinpoint abdominal pain, fever, inability to eat or drink.

## 2016-11-02 NOTE — ED Provider Notes (Signed)
Cynthia Peters Note   CSN: [id number] Arrival date & time: 11/02/16  1239     History   Chief Complaint Chief Complaint  Patient presents with  . Abdominal Pain    HPI Cynthia Peters is a 66 y.o. female.  66 yo F with a chief complaint of diffuse abdominal pain. Going on since this morning. Colicky diffuse about the abdomen worst in the upper aspect. Denies radiation. Denies fevers or chills. Has had some mild nausea but denies vomiting. Has had multiple bowel movements today and started having very loose stools on arrival to the ED. Denies sick contacts.   The history is provided by the patient.  Abdominal Pain   This is a new problem. The current episode started less than 1 hour ago. The problem occurs constantly. The problem has not changed since onset.The pain is located in the generalized abdominal region. The quality of the pain is cramping. The pain is at a severity of 5/10. The pain is moderate. Associated symptoms include diarrhea and nausea. Pertinent negatives include fever, vomiting, dysuria, headaches, arthralgias and myalgias. Nothing aggravates the symptoms. Nothing relieves the symptoms.    Past Medical History:  Diagnosis Date  . Anemia    history in the past, not on a regular basis  . Arthritis   . Bronchitis   . Cataracts, bilateral    immature  . Claustrophobia    takes Valium daily as needed  . Complication of anesthesia    2013 SURGERY vocal cord bothering pt, used smaller ent tube after 2 hernia repair, no problem after . " I woke up during my MRI and colonoscopy."       . Depression    takes Wellbutrin daily  . Dry eye   . Dry mouth   . GERD (gastroesophageal reflux disease)    takes Omeprazole daily   . H/O hiatal hernia   . Headache    migraines with flashing lights   . History of blood clots 40 yrs    leg   . History of echocardiogram 2013   a. Echo (05/2011): Mild LVH, EF 65-70%, no WMA, Gr 1 DD, mild AI.  Cynthia Peters  History of MRSA infection   . History of staph infection   . History of stress test 2010   a. ETT-Echo in 2010 was normal  . History of TIA (transient ischemic attack) 2013   a. Carotid US (05/2011): No ICA stenosis, pt unsure of this  . Hyperlipidemia    takes Simvastatin daily   . Hypertension    takes  Losartan daily  . IBS (irritable bowel syndrome)    takes Bentyl daily as needed  . Insomnia    takes Ambien nightly  . Interstitial cystitis   . Lichen planus   . Mild asthma    Albuterol inhaler as needed  . Palpitations    Event monitor in 2007 with rare PVCs // takes Metoprolol daily  . PVC (premature ventricular contraction)   . Seasonal allergies    takes Claritin daily as needed.Takes Singulair daily as needed.  Cynthia Peters Urethral stricture   . Vertigo    takes Meclizine daily as needed    Patient Active Problem List   Diagnosis Date Noted  . Right radial head fracture 06/17/2016  . S/P hernia repair 03/26/2015  . S/P repair of ventral hernia 10/25/2014  . Abdominal hernia 10/20/2014  . Anxiety 10/20/2014  . 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 Interstitial cystitis 03/15/2013  . Lichen  planus 03/15/2013  . TIA (transient ischemic attack) 05/27/2011  . Hypokalemia 05/26/2011  . PVC's (premature ventricular contractions)   . Hypertension   . Hyperlipidemia     Past Surgical History:  Procedure Laterality Date  . CESAREAN SECTION    . COLONOSCOPY WITH PROPOFOL N/A 08/10/2012   Procedure: COLONOSCOPY WITH PROPOFOL;  Surgeon: Garlan Fair, MD;  Location: [facility];  Service: Endoscopy;  Laterality: N/A;  . CYSTOSCOPY WITH URETHRAL DILATATION N/A 02/18/2013   Procedure: CYSTOSCOPY WITH URETHRAL DILATATION ( NO BALLOON) AND HYDRODISTENSION;  Surgeon: Alexis Frock, MD;  Location: [facility];  Service: Urology;  Laterality: N/A;  . ENDOMETRIAL ABLATION W/ HYDROTHERMABLATOR  09-27-2002  . INCISIONAL HERNIA REPAIR N/A 10/25/2014   Procedure: HERNIA REPAIR  INCISIONAL;  Surgeon: Ralene Ok, MD;  Location: [place];  Service: General;  Laterality: N/A;  . INCISIONAL HERNIA REPAIR N/A 03/26/2015   Procedure: LAPAROSCOPIC INCISIONAL HERNIA;  Surgeon: Ralene Ok, MD;  Location: [place];  Service: General;  Laterality: N/A;  . INSERTION OF MESH N/A 10/25/2014   Procedure: INSERTION OF MESH;  Surgeon: Ralene Ok, MD;  Location: [place];  Service: General;  Laterality: N/A;  . LAPAROSCOPIC LYSIS OF ADHESIONS N/A 03/26/2015   Procedure: LAPAROSCOPIC LYSIS OF ADHESIONS;  Surgeon: Ralene Ok, MD;  Location: [place];  Service: General;  Laterality: N/A;  . LAPAROTOMY N/A 10/25/2014   Procedure: EXPLORATORY LAPAROTOMY;  Surgeon: Ralene Ok, MD;  Location: [place];  Service: General;  Laterality: N/A;  . LYSIS OF ADHESION N/A 10/25/2014   Procedure: LYSIS OF ADHESIONS ;  Surgeon: Ralene Ok, MD;  Location: [place];  Service: General;  Laterality: N/A;  . MUCOSAL ADVANCEMENT FLAP N/A 10/25/2014   Procedure: MUCOSAL ADVANCEMENT FLAP;  Surgeon: Ralene Ok, MD;  Location: [place];  Service: General;  Laterality: N/A;  . ORIF RADIAL FRACTURE Right 06/17/2016   Procedure: right radial head arthroplasty with ligament repair, reconstruciton;  Surgeon: Roseanne Kaufman, MD;  Location: [place];  Service: Orthopedics;  Laterality: Right;  . RADIAL HEAD ARTHROPLASTY Right 06/17/2016  . TOOTH EXTRACTION    . TRANSTHORACIC ECHOCARDIOGRAM  05-26-2011   MILD LVH/  EF 26-94%/  GRADE I DIASTOLIC DYSFUNCTION/  MILD AR//   06-20-2008  NOMRAL STRESS ECHO  . UMBILICAL HERNIA REPAIR  2013   AND REVISION THE SAME YEAR W/ MESH  . WISDOM TOOTH EXTRACTION  AGE 42    OB History    Gravida Para Term Preterm AB Living   3 3 3     2    SAB TAB Ectopic Multiple Live Births                   Home Medications    Prior to Admission medications   Medication Sig Start Date End Date Taking? Authorizing Peters  albuterol (PROVENTIL HFA;VENTOLIN HFA) 108 (90 BASE) MCG/ACT  inhaler Inhale 2 puffs into the lungs every 6 (six) hours as needed for wheezing or shortness of breath.   Yes Peters, Historical, MD  ALPRAZolam Duanne Moron) 0.25 MG tablet Take I pill every 6 hours as needed for anxiety 06/18/16  Yes Avelina Laine, PA-C  amoxicillin-clavulanate (AUGMENTIN) 875-125 MG tablet Take 1 tablet by mouth 2 (two) times daily. for 10 days 08/15/16  Yes Peters, Historical, MD  BIOTIN PO Take 2,000 mcg by mouth daily.   Yes Peters, Historical, MD  buPROPion (WELLBUTRIN XL) 300 MG 24 hr tablet Take 300 mg by mouth daily.    Yes Peters, Historical,  MD  clobetasol cream (TEMOVATE) 1.22 % Apply 1 application topically 2 (two) times daily as needed. 04/01/16  Yes Peters, Historical, MD  Coenzyme Q10 (CO Q 10) 100 MG CAPS Take 100 mg by mouth daily.    Yes Peters, Historical, MD  cycloSPORINE (RESTASIS) 0.05 % ophthalmic emulsion Place 1 drop into both eyes 2 (two) times daily.   Yes Peters, Historical, MD  dexamethasone (DECADRON) 0.5 MG/5ML solution TAKE 10 ML BY MOUTH daily as needed FOR LICHENS  (AS A MOUTHRINSE) 09/01/14  Yes Peters, Historical, MD  Evening Primrose Oil 1000 MG CAPS Take 1,000 mg by mouth 2 (two) times daily.    Yes Peters, Historical, MD  Fexofenadine HCl (ALLEGRA PO) Take 1 tablet by mouth daily as needed (ALLERGIES).   Yes Peters, Historical, MD  Flaxseed, Linseed, 1000 MG CAPS Take 1,000 mg by mouth 2 (two) times daily.    Yes Peters, Historical, MD  fluticasone (FLONASE) 50 MCG/ACT nasal spray Place 2 sprays into both nostrils daily as needed for allergies or rhinitis.    Yes Peters, Historical, MD  hydrochlorothiazide 25 MG tablet Take 25 mg by mouth daily.    Yes Peters, Historical, MD  ibuprofen (ADVIL,MOTRIN) 200 MG tablet Take 400-600 mg by mouth 2 (two) times daily as needed (pain).    Yes Peters, Historical, MD  losartan (COZAAR) 100 MG tablet TAKE 1 TABLET BY MOUTH EVERY DAY 09/22/16  Yes [place], Peter M, MD  metoprolol  succinate (TOPROL-XL) 100 MG 24 hr tablet Take 1 tablet in the morning and 1/2 tablet (50 mg) in the evening. Take with or immediately following a meal. 08/29/16  Yes Rosita Fire, Brittainy M, PA-C  montelukast (SINGULAIR) 10 MG tablet Take 10 mg by mouth daily.    Yes Peters, Historical, MD  nystatin (MYCOSTATIN) 100000 UNIT/ML suspension Take 5 mLs by mouth daily as needed (FLARES, LICHEN PLANTIS).   Yes Peters, Historical, MD  nystatin cream (MYCOSTATIN) Apply 1 application topically 2 (two) times daily as needed for dry skin.   Yes Peters, Historical, MD  omeprazole (PRILOSEC) 20 MG capsule Take 20 mg by mouth daily.   Yes Peters, Historical, MD  phenazopyridine (PYRIDIUM) 200 MG tablet Take 200 mg by mouth 3 (three) times daily as needed for pain [place] TRACT PAIN).   Yes Peters, Historical, MD  potassium chloride SA (KLOR-CON M20) 20 MEQ tablet Take 1.5 tablets (30 mEq total) by mouth daily. 08/29/16  Yes Rosita Fire, Brittainy M, PA-C  simvastatin (ZOCOR) 40 MG tablet Take 40 mg by mouth daily.    Yes Peters, Historical, MD  acetaminophen (TYLENOL) 500 MG tablet Take 1,000 mg by mouth every 6 (six) hours as needed (pain).     Peters, Historical, MD  Calcium Carb-Cholecalciferol (CALCIUM CARBONATE-VITAMIN D3 PO) Take 1 tablet by mouth daily.    Peters, Historical, MD  Cholecalciferol (VITAMIN D) 2000 units CAPS Take 2,000 Units by mouth daily.    Peters, Historical, MD  CVS VITAMIN C 1000 MG tablet Take 1,000 mg by mouth daily. 06/13/16   Peters, Historical, MD  dicyclomine (BENTYL) 10 MG capsule Take 10 mg by mouth every 4 (four) hours as needed (for abdominal cramps).  02/13/15   Peters, Historical, MD  HYDROCORTISONE ACE, RECTAL, 30 MG SUPP Place 30 mg rectally 2 (two) times daily as needed for hemorrhoids. 04/07/16   Peters, Historical, MD  indomethacin (INDOCIN SR) 75 MG CR capsule Take 75 mg by mouth daily. with food 06/13/16   Peters, Historical, MD  lidocaine (  XYLOCAINE) 5  % ointment Apply 1 application topically 3 (three) times daily as needed for mild pain or moderate pain.    Peters, Historical, MD  meclizine (ANTIVERT) 25 MG tablet Take 1 tablet (25 mg total) by mouth 3 (three) times daily as needed for dizziness. 04/14/13   Molpus, John, MD  Multiple Vitamin (MULTIVITAMIN WITH MINERALS) TABS Take 1 tablet by mouth daily.    Peters, Historical, MD  ondansetron (ZOFRAN ODT) 4 MG disintegrating tablet 4mg  ODT q4 hours prn nausea/vomit 11/02/16   Deno Etienne, DO  ondansetron (ZOFRAN) 4 MG tablet Take 4 mg by mouth every 8 (eight) hours as needed for nausea or vomiting.    Peters, Historical, MD  PAZEO 0.7 % SOLN Place 1 drop into both eyes daily as needed for allergies. 06/05/16   Peters, Historical, MD  PREVIDENT 5000 DRY MOUTH 1.1 % GEL dental gel USE ONCE DAILY IN PLACE OF REGULAR TOOTHEPASTE 09/01/14   Peters, Historical, MD  Probiotic Product (ALIGN PO) Take 1 capsule by mouth 2 (two) times daily.     Peters, Historical, MD  valACYclovir (VALTREX) 500 MG tablet INCREASE TO TAKING 1 TABLET 2 TIMES A DAY FOR 3 DAYS WITH OUTBREAK Patient taking differently: TAKE 1 TABLET BY MOUTH DAILY 03/25/16   Kem Boroughs, FNP    Family History Family History  Problem Relation Age of Onset  . Dementia Mother   . Leukemia Child   . Cirrhosis Father     Social History Social History  Substance Use Topics  . Smoking status: Former Smoker    Packs/day: 0.25    Years: 2.00    Types: Cigarettes    Quit date: 02/04/1976  . Smokeless tobacco: Never Used  . Alcohol use Yes     Comment: occasional     Allergies   Hydrocodone; Hydrocodone-acetaminophen; and Tramadol   Review of Systems Review of Systems  Constitutional: Negative for chills and fever.  HENT: Negative for congestion and rhinorrhea.   Eyes: Negative for redness and visual disturbance.  Respiratory: Negative for shortness of breath and wheezing.   Cardiovascular: Negative for chest pain and  palpitations.  Gastrointestinal: Positive for abdominal pain, diarrhea and nausea. Negative for vomiting.  Genitourinary: Negative for dysuria and urgency.  Musculoskeletal: Negative for arthralgias and myalgias.  Skin: Negative for pallor and wound.  Neurological: Negative for dizziness and headaches.     Physical Exam Updated Vital Signs BP (!) 149/85 (BP Location: Right Arm)   Pulse 68   Temp 98.6 F (37 C) (Oral)   Resp 18   Ht 5\' 4"  (1.626 m)   Wt 72.6 kg (160 lb)   SpO2 100%   BMI 27.46 kg/m   Physical Exam  Constitutional: She is oriented to person, place, and time. She appears well-developed and well-nourished. No distress.  HENT:  Head: Normocephalic and atraumatic.  Eyes: Pupils are equal, round, and reactive to light. EOM are normal.  Neck: Normal range of motion. Neck supple.  Cardiovascular: Normal rate and regular rhythm.  Exam reveals no gallop and no friction rub.   No murmur heard. Pulmonary/Chest: Effort normal. She has no wheezes. She has no rales.  Abdominal: Soft. She exhibits no distension and no mass. There is no tenderness. There is no guarding.  Midline incision scar. No abdominal pain.  Musculoskeletal: She exhibits no edema or tenderness.  Neurological: She is alert and oriented to person, place, and time.  Skin: Skin is warm and dry. She is not diaphoretic.  Psychiatric: She has a normal mood and affect. Her behavior is normal.  Nursing note and vitals reviewed.    ED Treatments / Results  Labs (all labs ordered are listed, but only abnormal results are displayed) Labs Reviewed  CBC WITH DIFFERENTIAL/PLATELET - Abnormal; Notable for the following:       Result Value   WBC 14.4 (*)    Neutro Abs 11.4 (*)    All other components within normal limits  COMPREHENSIVE METABOLIC PANEL - Abnormal; Notable for the following:    Glucose, Bld 127 (*)    Total Bilirubin 1.4 (*)    All other components within normal limits  URINALYSIS, ROUTINE W  REFLEX MICROSCOPIC - Abnormal; Notable for the following:    Color, Urine ORANGE (*)    APPearance TURBID (*)    Glucose, UA   (*)    Value: TEST NOT REPORTED DUE TO COLOR INTERFERENCE OF URINE PIGMENT   Hgb urine dipstick   (*)    Value: TEST NOT REPORTED DUE TO COLOR INTERFERENCE OF URINE PIGMENT   Bilirubin Urine   (*)    Value: TEST NOT REPORTED DUE TO COLOR INTERFERENCE OF URINE PIGMENT   Ketones, ur   (*)    Value: TEST NOT REPORTED DUE TO COLOR INTERFERENCE OF URINE PIGMENT   Protein, ur   (*)    Value: TEST NOT REPORTED DUE TO COLOR INTERFERENCE OF URINE PIGMENT   Nitrite   (*)    Value: TEST NOT REPORTED DUE TO COLOR INTERFERENCE OF URINE PIGMENT   Leukocytes, UA   (*)    Value: TEST NOT REPORTED DUE TO COLOR INTERFERENCE OF URINE PIGMENT   All other components within normal limits  URINALYSIS, MICROSCOPIC (REFLEX) - Abnormal; Notable for the following:    Bacteria, UA MANY (*)    Squamous Epithelial / LPF 6-30 (*)    All other components within normal limits  URINE CULTURE  LIPASE, BLOOD    EKG  EKG Interpretation None       Radiology No results found.  Procedures Procedures (including critical care time)  Medications Ordered in ED Medications  gi cocktail (Maalox,Lidocaine,Donnatal) (30 mLs Oral Refused 11/02/16 1626)  ondansetron (ZOFRAN) injection 4 mg (4 mg Intravenous Given 11/02/16 1622)  sodium chloride 0.9 % bolus 1,000 mL (1,000 mLs Intravenous New Bag/Given 11/02/16 1625)     Initial Impression / Assessment and Plan / ED Course  I have reviewed the triage vital signs and the nursing notes.  Pertinent labs & imaging results that were available during my care of the patient were reviewed by me and considered in my medical decision making (see chart for details).     66 yo F with a chief complaint of diffuse abdominal pain. Benign abdominal exam. Suspect this is viral illness. Will check labs give fluids nausea medicine reassess.  Patient  continues to feel well. Able to tolerate by mouth. The patient does had 7 CT's since 2012.  Patient thinks she may still need a CT but understands radiation risk.  With minimal symptoms will follow up with PCP.   UA with UTI, the patient has a history of interstitial cystitis. States last time she had that she did not have any bacteria on culture. I will hold off on treatment at this time.  5:04 PM:  I have discussed the diagnosis/risks/treatment options with the patient and family and believe the pt to be eligible for discharge home to follow-up with PCP. We also discussed returning  to the ED immediately if new or worsening sx occur. We discussed the sx which are most concerning (e.g., sudden worsening pain, fever, inability to tolerate by mouth) that necessitate immediate return. Medications administered to the patient during their visit and any new prescriptions provided to the patient are listed below.  Medications given during this visit Medications  gi cocktail (Maalox,Lidocaine,Donnatal) (30 mLs Oral Refused 11/02/16 1626)  ondansetron (ZOFRAN) injection 4 mg (4 mg Intravenous Given 11/02/16 1622)  sodium chloride 0.9 % bolus 1,000 mL (1,000 mLs Intravenous New Bag/Given 11/02/16 1625)     The patient appears reasonably screen and/or stabilized for discharge and I doubt any other medical condition or other [facility] requiring further screening, evaluation, or treatment in the ED at this time prior to discharge.    Final Clinical Impressions(s) / ED Diagnoses   Final diagnoses:  Generalized abdominal pain    New Prescriptions New Prescriptions   ONDANSETRON (ZOFRAN ODT) 4 MG DISINTEGRATING TABLET    4mg  ODT q4 hours prn nausea/vomit     Deno Etienne, DO 11/02/16 1704

## 2016-11-04 LAB — URINE CULTURE: Culture: NO GROWTH

## 2017-02-06 ENCOUNTER — Encounter: Payer: Self-pay | Admitting: Cardiology

## 2017-02-19 DIAGNOSIS — E559 Vitamin D deficiency, unspecified: Secondary | ICD-10-CM | POA: Insufficient documentation

## 2017-02-19 DIAGNOSIS — R600 Localized edema: Secondary | ICD-10-CM | POA: Insufficient documentation

## 2017-02-19 DIAGNOSIS — M542 Cervicalgia: Secondary | ICD-10-CM | POA: Insufficient documentation

## 2017-05-28 ENCOUNTER — Other Ambulatory Visit: Payer: Self-pay

## 2017-05-28 ENCOUNTER — Inpatient Hospital Stay (HOSPITAL_BASED_OUTPATIENT_CLINIC_OR_DEPARTMENT_OTHER)
Admission: EM | Admit: 2017-05-28 | Discharge: 2017-05-31 | DRG: 390 | Disposition: A | Discharge status: Home / Self Care | Payer: Medicare Other | Attending: Internal Medicine | Admitting: Internal Medicine

## 2017-05-28 ENCOUNTER — Emergency Department (HOSPITAL_BASED_OUTPATIENT_CLINIC_OR_DEPARTMENT_OTHER): Payer: Medicare Other

## 2017-05-28 ENCOUNTER — Encounter (HOSPITAL_BASED_OUTPATIENT_CLINIC_OR_DEPARTMENT_OTHER): Payer: Self-pay

## 2017-05-28 DIAGNOSIS — I493 Ventricular premature depolarization: Secondary | ICD-10-CM | POA: Diagnosis present

## 2017-05-28 DIAGNOSIS — K5651 Intestinal adhesions [bands], with partial obstruction: Secondary | ICD-10-CM | POA: Diagnosis not present

## 2017-05-28 DIAGNOSIS — Z79899 Other long term (current) drug therapy: Secondary | ICD-10-CM

## 2017-05-28 DIAGNOSIS — M1990 Unspecified osteoarthritis, unspecified site: Secondary | ICD-10-CM | POA: Diagnosis present

## 2017-05-28 DIAGNOSIS — I10 Essential (primary) hypertension: Secondary | ICD-10-CM | POA: Diagnosis present

## 2017-05-28 DIAGNOSIS — J45909 Unspecified asthma, uncomplicated: Secondary | ICD-10-CM | POA: Diagnosis present

## 2017-05-28 DIAGNOSIS — K219 Gastro-esophageal reflux disease without esophagitis: Secondary | ICD-10-CM | POA: Diagnosis present

## 2017-05-28 DIAGNOSIS — Z8673 Personal history of transient ischemic attack (TIA), and cerebral infarction without residual deficits: Secondary | ICD-10-CM

## 2017-05-28 DIAGNOSIS — K56609 Unspecified intestinal obstruction, unspecified as to partial versus complete obstruction: Secondary | ICD-10-CM | POA: Diagnosis present

## 2017-05-28 DIAGNOSIS — Z791 Long term (current) use of non-steroidal anti-inflammatories (NSAID): Secondary | ICD-10-CM

## 2017-05-28 DIAGNOSIS — Z87891 Personal history of nicotine dependence: Secondary | ICD-10-CM

## 2017-05-28 DIAGNOSIS — F40240 Claustrophobia: Secondary | ICD-10-CM | POA: Diagnosis present

## 2017-05-28 DIAGNOSIS — R140 Abdominal distension (gaseous): Secondary | ICD-10-CM

## 2017-05-28 DIAGNOSIS — K589 Irritable bowel syndrome without diarrhea: Secondary | ICD-10-CM | POA: Diagnosis present

## 2017-05-28 DIAGNOSIS — F419 Anxiety disorder, unspecified: Secondary | ICD-10-CM | POA: Diagnosis present

## 2017-05-28 DIAGNOSIS — K56600 Partial intestinal obstruction, unspecified as to cause: Secondary | ICD-10-CM

## 2017-05-28 DIAGNOSIS — F329 Major depressive disorder, single episode, unspecified: Secondary | ICD-10-CM | POA: Diagnosis present

## 2017-05-28 DIAGNOSIS — Z7982 Long term (current) use of aspirin: Secondary | ICD-10-CM

## 2017-05-28 DIAGNOSIS — E785 Hyperlipidemia, unspecified: Secondary | ICD-10-CM | POA: Diagnosis present

## 2017-05-28 DIAGNOSIS — Z86718 Personal history of other venous thrombosis and embolism: Secondary | ICD-10-CM

## 2017-05-28 DIAGNOSIS — Z8614 Personal history of Methicillin resistant Staphylococcus aureus infection: Secondary | ICD-10-CM

## 2017-05-28 DIAGNOSIS — Z885 Allergy status to narcotic agent status: Secondary | ICD-10-CM

## 2017-05-28 LAB — COMPREHENSIVE METABOLIC PANEL
ALT: 25 U/L (ref 14–54)
AST: 28 U/L (ref 15–41)
Albumin: 4.6 g/dL (ref 3.5–5.0)
Alkaline Phosphatase: 55 U/L (ref 38–126)
Anion gap: 10 (ref 5–15)
BUN: 27 mg/dL — ABNORMAL HIGH (ref 6–20)
CO2: 23 mmol/L (ref 22–32)
Calcium: 9.5 mg/dL (ref 8.9–10.3)
Chloride: 105 mmol/L (ref 101–111)
Creatinine, Ser: 1.08 mg/dL — ABNORMAL HIGH (ref 0.44–1.00)
GFR calc Af Amer: >60 mL/min (ref >60)
GFR calc non Af Amer: 53 mL/min — ABNORMAL LOW (ref >60)
Glucose, Bld: 119 mg/dL — ABNORMAL HIGH (ref 65–99)
Potassium: 4 mmol/L (ref 3.5–5.1)
Sodium: 138 mmol/L (ref 135–145)
Total Bilirubin: 1.2 mg/dL (ref 0.3–1.2)
Total Protein: 6.8 g/dL (ref 6.5–8.1)

## 2017-05-28 LAB — CBC WITH DIFFERENTIAL/PLATELET
Basophils Absolute: 0.1 10*3/uL (ref 0.0–0.1)
Basophils Relative: 1 %
Eosinophils Absolute: 0.7 10*3/uL (ref 0.0–0.7)
Eosinophils Relative: 6 %
HCT: 39.4 % (ref 36.0–46.0)
Hemoglobin: 14.1 g/dL (ref 12.0–15.0)
Lymphocytes Relative: 20 %
Lymphs Abs: 2.1 10*3/uL (ref 0.7–4.0)
MCH: 34.2 pg — ABNORMAL HIGH (ref 26.0–34.0)
MCHC: 35.8 g/dL (ref 30.0–36.0)
MCV: 95.6 fL (ref 78.0–100.0)
Monocytes Absolute: 1.1 10*3/uL — ABNORMAL HIGH (ref 0.1–1.0)
Monocytes Relative: 10 %
Neutro Abs: 6.9 10*3/uL (ref 1.7–7.7)
Neutrophils Relative %: 63 %
Platelets: 221 10*3/uL (ref 150–400)
RBC: 4.12 MIL/uL (ref 3.87–5.11)
RDW: 12.4 % (ref 11.5–15.5)
WBC: 10.9 10*3/uL — ABNORMAL HIGH (ref 4.0–10.5)

## 2017-05-28 LAB — URINALYSIS, ROUTINE W REFLEX MICROSCOPIC
Bilirubin Urine: NEGATIVE
Glucose, UA: NEGATIVE mg/dL
Hgb urine dipstick: NEGATIVE
Ketones, ur: NEGATIVE mg/dL
Leukocytes, UA: NEGATIVE
Nitrite: NEGATIVE
Protein, ur: NEGATIVE mg/dL
Specific Gravity, Urine: <1.005 — ABNORMAL LOW (ref 1.005–1.030)
pH: 6.5 (ref 5.0–8.0)

## 2017-05-28 LAB — LIPASE, BLOOD: Lipase: 33 U/L (ref 11–51)

## 2017-05-28 MED ORDER — FENTANYL CITRATE (PF) 100 MCG/2ML IJ SOLN
100.0000 ug | Freq: Once | INTRAMUSCULAR | Status: AC
  Administered 2017-05-28: 100 ug via INTRAVENOUS
  Filled 2017-05-28: qty 2

## 2017-05-28 MED ORDER — IOPAMIDOL (ISOVUE-300) INJECTION 61%
100.0000 mL | Freq: Once | INTRAVENOUS | Status: AC | PRN
  Administered 2017-05-29: 100 mL via INTRAVENOUS

## 2017-05-28 MED ORDER — SODIUM CHLORIDE 0.9 % IV BOLUS
1000.0000 mL | Freq: Once | INTRAVENOUS | Status: AC
  Administered 2017-05-28: 1000 mL via INTRAVENOUS

## 2017-05-28 MED ORDER — ONDANSETRON HCL 4 MG/2ML IJ SOLN
4.0000 mg | Freq: Once | INTRAMUSCULAR | Status: AC
  Administered 2017-05-28: 4 mg via INTRAVENOUS
  Filled 2017-05-28: qty 2

## 2017-05-28 NOTE — ED Notes (Signed)
Pt later added she completed augmentin today x 10 days for "tonsil stone"

## 2017-05-28 NOTE — ED Provider Notes (Signed)
[facility] Provider Note: [name], MD, FACEP  CSN: [id number] MRN: [id number] ARRIVAL: 05/28/17 at 2137 ROOM: [place]  Abdominal Pain   HISTORY OF PRESENT ILLNESS  05/28/17 11:01 PM Cynthia Peters is a 67 y.o. female with a history of multiple abdominal surgeries related to hernias.  She is here with 4 hours of abdominal pain.  She describes the pain as twisting in nature.  She rates it as a 9 out of 10.  It is somewhat worse with movement or palpation.  The pain is diffuse but more prominent in the epigastrium.  She denies associated fever, nausea, vomiting or diarrhea.  She has had multiple bowel movements today.  Her abdomen is more distended than usual.   Past Medical History:  Diagnosis Date  . Anemia    history in the past, not on a regular basis  . Arthritis   . Bronchitis   . Cataracts, bilateral    immature  . Claustrophobia    takes Valium daily as needed  . Complication of anesthesia    2013 SURGERY vocal cord bothering pt, used smaller ent tube after 2 hernia repair, no problem after . " I woke up during my MRI and colonoscopy."       . Depression    takes Wellbutrin daily  . Dry eye   . Dry mouth   . GERD (gastroesophageal reflux disease)    takes Omeprazole daily   . H/O hiatal hernia   . Headache    migraines with flashing lights   . History of blood clots 40 yrs    leg   . History of echocardiogram 2013   a. Echo (05/2011): Mild LVH, EF 65-70%, no WMA, Gr 1 DD, mild AI.  Marland Kitchen History of MRSA infection   . History of staph infection   . History of stress test 2010   a. ETT-Echo in 2010 was normal  . History of TIA (transient ischemic attack) 2013   a. Carotid US (05/2011): No ICA stenosis, pt unsure of this  . Hyperlipidemia    takes Simvastatin daily   . Hypertension    takes  Losartan daily  . IBS (irritable bowel syndrome)    takes Bentyl daily as needed  . Insomnia    takes Ambien nightly  .  Interstitial cystitis   . Lichen planus   . Mild asthma    Albuterol inhaler as needed  . Palpitations    Event monitor in 2007 with rare PVCs // takes Metoprolol daily  . PVC (premature ventricular contraction)   . Seasonal allergies    takes Claritin daily as needed.Takes Singulair daily as needed.  Marland Kitchen Urethral stricture   . Vertigo    takes Meclizine daily as needed    Past Surgical History:  Procedure Laterality Date  . CESAREAN SECTION    . COLONOSCOPY WITH PROPOFOL N/A 08/10/2012   Procedure: COLONOSCOPY WITH PROPOFOL;  Surgeon: Garlan Fair, MD;  Location: [facility];  Service: Endoscopy;  Laterality: N/A;  . CYSTOSCOPY WITH URETHRAL DILATATION N/A 02/18/2013   Procedure: CYSTOSCOPY WITH URETHRAL DILATATION ( NO BALLOON) AND HYDRODISTENSION;  Surgeon: Alexis Frock, MD;  Location: [facility];  Service: Urology;  Laterality: N/A;  . ENDOMETRIAL ABLATION W/ HYDROTHERMABLATOR  09-27-2002  . INCISIONAL HERNIA REPAIR N/A 10/25/2014   Procedure: HERNIA REPAIR INCISIONAL;  Surgeon: Ralene Ok, MD;  Location: [place];  Service: General;  Laterality: N/A;  . [place]  N/A 03/26/2015   Procedure: LAPAROSCOPIC INCISIONAL HERNIA;  Surgeon: Ralene Ok, MD;  Location: [place];  Service: General;  Laterality: N/A;  . INSERTION OF MESH N/A 10/25/2014   Procedure: INSERTION OF MESH;  Surgeon: Ralene Ok, MD;  Location: [place];  Service: General;  Laterality: N/A;  . LAPAROSCOPIC LYSIS OF ADHESIONS N/A 03/26/2015   Procedure: LAPAROSCOPIC LYSIS OF ADHESIONS;  Surgeon: Ralene Ok, MD;  Location: [place];  Service: General;  Laterality: N/A;  . LAPAROTOMY N/A 10/25/2014   Procedure: EXPLORATORY LAPAROTOMY;  Surgeon: Ralene Ok, MD;  Location: [place];  Service: General;  Laterality: N/A;  . LYSIS OF ADHESION N/A 10/25/2014   Procedure: LYSIS OF ADHESIONS ;  Surgeon: Ralene Ok, MD;  Location: [place];  Service: General;  Laterality: N/A;  . MUCOSAL  ADVANCEMENT FLAP N/A 10/25/2014   Procedure: MUCOSAL ADVANCEMENT FLAP;  Surgeon: Ralene Ok, MD;  Location: [place];  Service: General;  Laterality: N/A;  . ORIF RADIAL FRACTURE Right 06/17/2016   Procedure: right radial head arthroplasty with ligament repair, reconstruciton;  Surgeon: Roseanne Kaufman, MD;  Location: [place];  Service: Orthopedics;  Laterality: Right;  . RADIAL HEAD ARTHROPLASTY Right 06/17/2016  . TOOTH EXTRACTION    . TRANSTHORACIC ECHOCARDIOGRAM  05-26-2011   MILD LVH/  EF 48-54%/  GRADE I DIASTOLIC DYSFUNCTION/  MILD AR//   06-20-2008  NOMRAL STRESS ECHO  . UMBILICAL HERNIA REPAIR  2013   AND REVISION THE SAME YEAR W/ MESH  . WISDOM TOOTH EXTRACTION  AGE 41    Family History  Problem Relation Age of Onset  . Dementia Mother   . Leukemia Child   . Cirrhosis Father     Social History   Tobacco Use  . Smoking status: Former Smoker    Packs/day: 0.25    Years: 2.00    Pack years: 0.50    Types: Cigarettes    Last attempt to quit: 02/04/1976    Years since quitting: 41.3  . Smokeless tobacco: Never Used  Substance Use Topics  . Alcohol use: Yes    Comment: occasional  . Drug use: No    Prior to Admission medications   Medication Sig Start Date End Date Taking? Authorizing Provider  acetaminophen (TYLENOL) 500 MG tablet Take 1,000 mg by mouth every 6 (six) hours as needed (pain).     [provider]  albuterol (PROVENTIL HFA;VENTOLIN HFA) 108 (90 BASE) MCG/ACT inhaler Inhale 2 puffs into the lungs every 6 (six) hours as needed for wheezing or shortness of breath.    [provider]  ALPRAZolam Duanne Moron) 0.25 MG tablet Take I pill every 6 hours as needed for anxiety 06/18/16   Avelina Laine, PA-C  amoxicillin-clavulanate (AUGMENTIN) 875-125 MG tablet Take 1 tablet by mouth 2 (two) times daily. for 10 days 08/15/16   [provider]  BIOTIN PO Take 2,000 mcg by mouth daily.    [provider]  buPROPion (WELLBUTRIN XL) 300 MG 24  hr tablet Take 300 mg by mouth daily.     [provider]  Calcium Carb-Cholecalciferol (CALCIUM CARBONATE-VITAMIN D3 PO) Take 1 tablet by mouth daily.    [provider]  Cholecalciferol (VITAMIN D) 2000 units CAPS Take 2,000 Units by mouth daily.    [provider]  clobetasol cream (TEMOVATE) 6.27 % Apply 1 application topically 2 (two) times daily as needed. 04/01/16   [provider]  Coenzyme Q10 (CO Q 10) 100 MG CAPS Take 100 mg by mouth daily.  [provider]  [employer] VITAMIN C 1000 MG tablet Take 1,000 mg by mouth daily. 06/13/16   [provider]  cycloSPORINE (RESTASIS) 0.05 % ophthalmic emulsion Place 1 drop into both eyes 2 (two) times daily.    [provider]  dexamethasone (DECADRON) 0.5 MG/5ML solution TAKE 10 ML BY MOUTH daily as needed FOR LICHENS  (AS A MOUTHRINSE) 09/01/14   [provider]  dicyclomine (BENTYL) 10 MG capsule Take 10 mg by mouth every 4 (four) hours as needed (for abdominal cramps).  02/13/15   [provider]  Evening Primrose Oil 1000 MG CAPS Take 1,000 mg by mouth 2 (two) times daily.     [provider]  Fexofenadine HCl (ALLEGRA PO) Take 1 tablet by mouth daily as needed (ALLERGIES).    [provider]  Flaxseed, Linseed, 1000 MG CAPS Take 1,000 mg by mouth 2 (two) times daily.     [provider]  fluticasone (FLONASE) 50 MCG/ACT nasal spray Place 2 sprays into both nostrils daily as needed for allergies or rhinitis.     [provider]  hydrochlorothiazide 25 MG tablet Take 25 mg by mouth daily.     [provider]  HYDROCORTISONE ACE, RECTAL, 30 MG SUPP Place 30 mg rectally 2 (two) times daily as needed for hemorrhoids. 04/07/16   [provider]  ibuprofen (ADVIL,MOTRIN) 200 MG tablet Take 400-600 mg by mouth 2 (two) times daily as needed (pain).     [provider]  indomethacin (INDOCIN SR) 75 MG CR capsule Take 75  mg by mouth daily. with food 06/13/16   [provider]  lidocaine (XYLOCAINE) 5 % ointment Apply 1 application topically 3 (three) times daily as needed for mild pain or moderate pain.    [provider]  losartan (COZAAR) 100 MG tablet TAKE 1 TABLET BY MOUTH EVERY DAY 09/22/16   Martinique, Peter M, MD  meclizine (ANTIVERT) 25 MG tablet Take 1 tablet (25 mg total) by mouth 3 (three) times daily as needed for dizziness. 04/14/13   Niamh Rada, MD  metoprolol succinate (TOPROL-XL) 100 MG 24 hr tablet Take 1 tablet in the morning and 1/2 tablet (50 mg) in the evening. Take with or immediately following a meal. 08/29/16   Lyda Jester M, PA-C  montelukast (SINGULAIR) 10 MG tablet Take 10 mg by mouth daily.     [provider]  Multiple Vitamin (MULTIVITAMIN WITH MINERALS) TABS Take 1 tablet by mouth daily.    [provider]  nystatin (MYCOSTATIN) 100000 UNIT/ML suspension Take 5 mLs by mouth daily as needed (FLARES, LICHEN PLANTIS).    [provider]  nystatin cream (MYCOSTATIN) Apply 1 application topically 2 (two) times daily as needed for dry skin.    [provider]  omeprazole (PRILOSEC) 20 MG capsule Take 20 mg by mouth daily.    [provider]  ondansetron (ZOFRAN ODT) 4 MG disintegrating tablet 4mg  ODT q4 hours prn nausea/vomit 11/02/16   Deno Etienne, DO  ondansetron (ZOFRAN) 4 MG tablet Take 4 mg by mouth every 8 (eight) hours as needed for nausea or vomiting.    [provider]  PAZEO 0.7 % SOLN Place 1 drop into both eyes daily as needed for allergies. 06/05/16   [provider]  phenazopyridine (PYRIDIUM) 200 MG tablet Take 200 mg by mouth 3 (three) times daily as needed for pain [place] TRACT PAIN).    [provider]  potassium chloride SA (KLOR-CON M20) 20  MEQ tablet Take 1.5 tablets (30 mEq total) by mouth daily. 08/29/16   Simmons, Brittainy M, PA-C  PREVIDENT 5000 DRY MOUTH 1.1 % GEL dental gel USE  ONCE DAILY IN PLACE OF REGULAR TOOTHEPASTE 09/01/14   [provider]  Probiotic Product (ALIGN PO) Take 1 capsule by mouth 2 (two) times daily.     [provider]  simvastatin (ZOCOR) 40 MG tablet Take 40 mg by mouth daily.     [provider]  valACYclovir (VALTREX) 500 MG tablet INCREASE TO TAKING 1 TABLET 2 TIMES A DAY FOR 3 DAYS WITH OUTBREAK Patient taking differently: TAKE 1 TABLET BY MOUTH DAILY 03/25/16   Kem Boroughs, FNP    Allergies Hydrocodone; Hydrocodone-acetaminophen; and Tramadol   REVIEW OF SYSTEMS  Negative except as noted here or in the History of Present Illness.   PHYSICAL EXAMINATION  Initial Vital Signs Blood pressure (!) 156/81, pulse 60, temperature 97.9 F (36.6 C), temperature source Oral, resp. rate 20, height 5\' 4"  (1.626 m), weight 77.1 kg (169 lb 15.6 oz), SpO2 100 %.  Examination General: Well-developed, well-nourished female in no acute distress; appearance consistent with age of record HENT: normocephalic; atraumatic Eyes: pupils equal, round and reactive to light; extraocular muscles intact Neck: supple Heart: regular rate and rhythm Lungs: clear to auscultation bilaterally Abdomen: soft; distended; diffusely tender; no masses palpated; bowel sounds hypoactive Extremities: No deformity; full range of motion; pulses normal Neurologic: Awake, alert and oriented; motor function intact in all extremities and symmetric; no facial droop Skin: Warm and dry Psychiatric: Normal mood and affect   RESULTS  Summary of this visit's results, reviewed by myself:   EKG Interpretation  Date/Time:    Ventricular Rate:    PR Interval:    QRS Duration:   QT Interval:    QTC Calculation:   R Axis:     Text Interpretation:        Laboratory Studies: Results for orders placed or performed during the hospital encounter of 05/28/17 (from the past 24 hour(s))  Urinalysis, Routine w reflex microscopic     Status: Abnormal    Collection Time: 05/28/17 10:16 PM  Result Value Ref Range   Color, Urine YELLOW YELLOW   APPearance CLEAR CLEAR   Specific Gravity, Urine <1.005 (L) 1.005 - 1.030   pH 6.5 5.0 - 8.0   Glucose, UA NEGATIVE NEGATIVE mg/dL   Hgb urine dipstick NEGATIVE NEGATIVE   Bilirubin Urine NEGATIVE NEGATIVE   Ketones, ur NEGATIVE NEGATIVE mg/dL   Protein, ur NEGATIVE NEGATIVE mg/dL   Nitrite NEGATIVE NEGATIVE   Leukocytes, UA NEGATIVE NEGATIVE  CBC with Differential     Status: Abnormal   Collection Time: 05/28/17 10:26 PM  Result Value Ref Range   WBC 10.9 (H) 4.0 - 10.5 K/uL   RBC 4.12 3.87 - 5.11 MIL/uL   Hemoglobin 14.1 12.0 - 15.0 g/dL   HCT 39.4 36.0 - 46.0 %   MCV 95.6 78.0 - 100.0 fL   MCH 34.2 (H) 26.0 - 34.0 pg   MCHC 35.8 30.0 - 36.0 g/dL   RDW 12.4 11.5 - 15.5 %   Platelets 221 150 - 400 K/uL   Neutrophils Relative % 63 %   Neutro Abs 6.9 1.7 - 7.7 K/uL   Lymphocytes Relative 20 %   Lymphs Abs 2.1 0.7 - 4.0 K/uL   Monocytes Relative 10 %   Monocytes Absolute 1.1 (H) 0.1 - 1.0 K/uL   Eosinophils Relative 6 %   Eosinophils Absolute  0.7 0.0 - 0.7 K/uL   Basophils Relative 1 %   Basophils Absolute 0.1 0.0 - 0.1 K/uL  Comprehensive metabolic panel     Status: Abnormal   Collection Time: 05/28/17 10:26 PM  Result Value Ref Range   Sodium 138 135 - 145 mmol/L   Potassium 4.0 3.5 - 5.1 mmol/L   Chloride 105 101 - 111 mmol/L   CO2 23 22 - 32 mmol/L   Glucose, Bld 119 (H) 65 - 99 mg/dL   BUN 27 (H) 6 - 20 mg/dL   Creatinine, Ser 1.08 (H) 0.44 - 1.00 mg/dL   Calcium 9.5 8.9 - 10.3 mg/dL   Total Protein 6.8 6.5 - 8.1 g/dL   Albumin 4.6 3.5 - 5.0 g/dL   AST 28 15 - 41 U/L   ALT 25 14 - 54 U/L   Alkaline Phosphatase 55 38 - 126 U/L   Total Bilirubin 1.2 0.3 - 1.2 mg/dL   GFR calc non Af Amer 53 (L) >60 mL/min   GFR calc Af Amer >60 >60 mL/min   Anion gap 10 5 - 15  Lipase, blood     Status: None   Collection Time: 05/28/17 10:26 PM  Result Value Ref Range   Lipase 33 11 -  51 U/L   Imaging Studies: Ct Abdomen Pelvis W Contrast  Result Date: 05/29/2017 CLINICAL DATA:  Mid abdominal pain for 5 hours. Abdominal distention. History of irritable bowel syndrome, ventral hernia repair, hiatal hernia, antibiotics for tonsil stone. EXAM: CT ABDOMEN AND PELVIS WITH CONTRAST TECHNIQUE: Multidetector CT imaging of the abdomen and pelvis was performed using the standard protocol following bolus administration of intravenous contrast. CONTRAST:  117mL ISOVUE-300 IOPAMIDOL (ISOVUE-300) INJECTION 61% COMPARISON:  03/12/2016 FINDINGS: Lower chest: Lung bases are clear. Hepatobiliary: Mild diffuse fatty infiltration of the liver. Scattered cysts in the liver, largest in the dome measuring 2.4 cm diameter. No change since previous study. Gallbladder and bile ducts are unremarkable. Pancreas: Unremarkable. No pancreatic ductal dilatation or surrounding inflammatory changes. Spleen: Normal in size without focal abnormality. Adrenals/Urinary Tract: Adrenal glands are unremarkable. Kidneys are normal, without renal calculi, focal lesion, or hydronephrosis. Bladder is unremarkable. Stomach/Bowel: Gastric wall is not thickened. Proximal small bowel are mildly dilated and fluid-filled with transition zone in the right lower quadrant. Distal small bowel are decompressed. Appearance is consistent with partial obstruction. There appears to be some regional wall thickening at the zone of transition suggesting an inflammatory etiology. No pneumatosis or portal venous gas. Colon is decompressed. Scattered stool in the colon. Appendix is not identified. Vascular/Lymphatic: Aortic atherosclerosis. No enlarged abdominal or pelvic lymph nodes. Reproductive: Uterus and bilateral adnexa are unremarkable. Other: Small amount of free fluid in the abdomen and pelvis with mild mesenteric edema, likely reactive. Small right inguinal hernia containing fat. Scarring in the anterior abdominal wall consistent with previous  postoperative change. Musculoskeletal: No acute or significant osseous findings. 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 IMPRESSION: 1. Changes of partial small bowel obstruction with transition zone in the right lower quadrant. Regional wall thickening in the area of transition suggest inflammatory etiology. Small amount of free fluid in the abdomen and pelvis with mesenteric edema likely reactive. 2. Diffuse fatty infiltration of the liver. 3. Benign-appearing hepatic cysts. Electronically Signed   By: Lucienne Capers M.D.   On: 05/29/2017 00:49    ED COURSE and MDM  Nursing notes and initial vitals signs, including pulse oximetry, reviewed.  Vitals:   05/28/17 2145 05/28/17 2354  BP: Marland Kitchen)  156/81 (!) 143/73  Pulse: 60 (!) 57  Resp: 20 18  Temp: 97.9 F (36.6 C)   TempSrc: Oral   SpO2: 100% 100%  Weight: 77.1 kg (169 lb 15.6 oz)   Height: 5\' 4"  (1.626 m)    Dr. Hal Hope accepts for transfer to [facility]. No NGT placed in ED as patient has not been vomiting.   PROCEDURES    ED DIAGNOSES     ICD-10-CM   1. Partial small bowel obstruction ([place]) K56.600        Derrisha Foos, Jenny Reichmann, MD 05/29/17 [phone]

## 2017-05-28 NOTE — ED Triage Notes (Signed)
C/o abd pain x 3-4 hours-denies v/d-NAD-steady gait

## 2017-05-29 ENCOUNTER — Encounter (HOSPITAL_COMMUNITY): Payer: Self-pay

## 2017-05-29 ENCOUNTER — Inpatient Hospital Stay (HOSPITAL_COMMUNITY): Payer: Medicare Other

## 2017-05-29 DIAGNOSIS — Z885 Allergy status to narcotic agent status: Secondary | ICD-10-CM | POA: Diagnosis not present

## 2017-05-29 DIAGNOSIS — Z791 Long term (current) use of non-steroidal anti-inflammatories (NSAID): Secondary | ICD-10-CM | POA: Diagnosis not present

## 2017-05-29 DIAGNOSIS — Z86718 Personal history of other venous thrombosis and embolism: Secondary | ICD-10-CM | POA: Diagnosis not present

## 2017-05-29 DIAGNOSIS — M1990 Unspecified osteoarthritis, unspecified site: Secondary | ICD-10-CM | POA: Diagnosis present

## 2017-05-29 DIAGNOSIS — I493 Ventricular premature depolarization: Secondary | ICD-10-CM

## 2017-05-29 DIAGNOSIS — K56609 Unspecified intestinal obstruction, unspecified as to partial versus complete obstruction: Secondary | ICD-10-CM | POA: Diagnosis not present

## 2017-05-29 DIAGNOSIS — Z87891 Personal history of nicotine dependence: Secondary | ICD-10-CM | POA: Diagnosis not present

## 2017-05-29 DIAGNOSIS — Z79899 Other long term (current) drug therapy: Secondary | ICD-10-CM | POA: Diagnosis not present

## 2017-05-29 DIAGNOSIS — K589 Irritable bowel syndrome without diarrhea: Secondary | ICD-10-CM | POA: Diagnosis present

## 2017-05-29 DIAGNOSIS — F419 Anxiety disorder, unspecified: Secondary | ICD-10-CM

## 2017-05-29 DIAGNOSIS — J45909 Unspecified asthma, uncomplicated: Secondary | ICD-10-CM | POA: Diagnosis present

## 2017-05-29 DIAGNOSIS — K56600 Partial intestinal obstruction, unspecified as to cause: Secondary | ICD-10-CM

## 2017-05-29 DIAGNOSIS — F329 Major depressive disorder, single episode, unspecified: Secondary | ICD-10-CM | POA: Diagnosis present

## 2017-05-29 DIAGNOSIS — I10 Essential (primary) hypertension: Secondary | ICD-10-CM

## 2017-05-29 DIAGNOSIS — K5651 Intestinal adhesions [bands], with partial obstruction: Secondary | ICD-10-CM | POA: Diagnosis present

## 2017-05-29 DIAGNOSIS — Z8614 Personal history of Methicillin resistant Staphylococcus aureus infection: Secondary | ICD-10-CM | POA: Diagnosis not present

## 2017-05-29 DIAGNOSIS — Z7982 Long term (current) use of aspirin: Secondary | ICD-10-CM | POA: Diagnosis not present

## 2017-05-29 DIAGNOSIS — Z8673 Personal history of transient ischemic attack (TIA), and cerebral infarction without residual deficits: Secondary | ICD-10-CM | POA: Diagnosis not present

## 2017-05-29 DIAGNOSIS — K219 Gastro-esophageal reflux disease without esophagitis: Secondary | ICD-10-CM | POA: Diagnosis present

## 2017-05-29 DIAGNOSIS — E785 Hyperlipidemia, unspecified: Secondary | ICD-10-CM | POA: Diagnosis present

## 2017-05-29 DIAGNOSIS — F40240 Claustrophobia: Secondary | ICD-10-CM | POA: Diagnosis present

## 2017-05-29 LAB — CBC
HCT: 42.9 % (ref 36.0–46.0)
Hemoglobin: 14.8 g/dL (ref 12.0–15.0)
MCH: 33.3 pg (ref 26.0–34.0)
MCHC: 34.5 g/dL (ref 30.0–36.0)
MCV: 96.4 fL (ref 78.0–100.0)
Platelets: 235 10*3/uL (ref 150–400)
RBC: 4.45 MIL/uL (ref 3.87–5.11)
RDW: 12.5 % (ref 11.5–15.5)
WBC: 13.5 10*3/uL — ABNORMAL HIGH (ref 4.0–10.5)

## 2017-05-29 LAB — BASIC METABOLIC PANEL
Anion gap: 14 (ref 5–15)
BUN: 19 mg/dL (ref 6–20)
CO2: 23 mmol/L (ref 22–32)
Calcium: 9.7 mg/dL (ref 8.9–10.3)
Chloride: 101 mmol/L (ref 101–111)
Creatinine, Ser: 0.87 mg/dL (ref 0.44–1.00)
GFR calc Af Amer: >60 mL/min (ref >60)
GFR calc non Af Amer: >60 mL/min (ref >60)
Glucose, Bld: 170 mg/dL — ABNORMAL HIGH (ref 65–99)
Potassium: 3.9 mmol/L (ref 3.5–5.1)
Sodium: 138 mmol/L (ref 135–145)

## 2017-05-29 LAB — HIV ANTIBODY (ROUTINE TESTING W REFLEX): HIV Screen 4th Generation wRfx: NONREACTIVE

## 2017-05-29 MED ORDER — KCL IN DEXTROSE-NACL 20-5-0.9 MEQ/L-%-% IV SOLN
INTRAVENOUS | Status: DC
  Administered 2017-05-29: 06:00:00 via INTRAVENOUS
  Filled 2017-05-29: qty 1000

## 2017-05-29 MED ORDER — FAMOTIDINE IN NACL 20-0.9 MG/50ML-% IV SOLN
20.0000 mg | Freq: Two times a day (BID) | INTRAVENOUS | Status: DC
  Administered 2017-05-29 (×2): 20 mg via INTRAVENOUS
  Filled 2017-05-29 (×3): qty 50

## 2017-05-29 MED ORDER — METOPROLOL TARTRATE 5 MG/5ML IV SOLN
5.0000 mg | Freq: Three times a day (TID) | INTRAVENOUS | Status: DC
  Administered 2017-05-29 – 2017-05-30 (×4): 5 mg via INTRAVENOUS
  Filled 2017-05-29 (×4): qty 5

## 2017-05-29 MED ORDER — ONDANSETRON HCL 4 MG/2ML IJ SOLN
4.0000 mg | Freq: Four times a day (QID) | INTRAMUSCULAR | Status: DC | PRN
  Administered 2017-05-29 (×2): 4 mg via INTRAVENOUS
  Filled 2017-05-29 (×3): qty 2

## 2017-05-29 MED ORDER — HYDROMORPHONE HCL 1 MG/ML IJ SOLN
0.5000 mg | INTRAMUSCULAR | Status: DC | PRN
  Administered 2017-05-29 – 2017-05-30 (×6): 0.5 mg via INTRAVENOUS
  Filled 2017-05-29 (×6): qty 1

## 2017-05-29 MED ORDER — ONDANSETRON HCL 4 MG PO TABS
4.0000 mg | ORAL_TABLET | Freq: Four times a day (QID) | ORAL | Status: DC | PRN
  Administered 2017-05-31 (×2): 4 mg via ORAL
  Filled 2017-05-29 (×3): qty 1

## 2017-05-29 MED ORDER — FENTANYL CITRATE (PF) 100 MCG/2ML IJ SOLN
50.0000 ug | INTRAMUSCULAR | Status: DC | PRN
  Administered 2017-05-29: 50 ug via INTRAVENOUS
  Filled 2017-05-29 (×2): qty 2

## 2017-05-29 MED ORDER — PROMETHAZINE HCL 25 MG/ML IJ SOLN
12.5000 mg | Freq: Once | INTRAMUSCULAR | Status: AC
  Administered 2017-05-29: 12.5 mg via INTRAVENOUS
  Filled 2017-05-29: qty 1

## 2017-05-29 MED ORDER — DEXTROSE IN LACTATED RINGERS 5 % IV SOLN
INTRAVENOUS | Status: DC
  Administered 2017-05-29 – 2017-05-30 (×2): via INTRAVENOUS

## 2017-05-29 MED ORDER — HYDRALAZINE HCL 20 MG/ML IJ SOLN: 10.0000 mg | INTRAMUSCULAR | Status: DC | PRN

## 2017-05-29 MED ORDER — ACETAMINOPHEN 325 MG PO TABS
650.0000 mg | ORAL_TABLET | Freq: Four times a day (QID) | ORAL | Status: DC | PRN
  Administered 2017-05-29 – 2017-05-30 (×3): 650 mg via ORAL
  Filled 2017-05-29 (×3): qty 2

## 2017-05-29 NOTE — Progress Notes (Signed)
PROGRESS NOTE    ATIYAH BAUER  HKV:[id number] DOB: 1950-05-06 DOA: 05/28/2017 PCP: Aletha Halim., PA-C    Brief Narrative:  67 year old female who presented with abdominal pain.  Patient does have significant past medical history for hypertension, irritable bowel syndrome, dyslipidemia, asthma, vertigo, anxiety/depression and history of 4 abdominal hernia repairs since 2013.   Abdominal pain was periumbilical, radiated to the lower quadrants, constant and persistent.  On the initial physical examination blood pressure 143/73, heart rate 57, respiratory 18, oxygen saturation 100%, temperature 98.4.  Dry mucous membranes, lungs clear to auscultation, heart S1-S2 present and rhythmic, the abdomen was soft, distended, decreased bowel sounds, no rigidity, no lower extremity edema.  CT of the abdomen with a partial small bowel obstruction with transition zone in the right lower quadrant.  Patient was admitted to the hospital with the working diagnosis was partial small bowel obstruction   Assessment & Plan:   Principal Problem:   Partial small bowel obstruction (HCC) Active Problems:   PVC's (premature ventricular contractions)   Hypertension   Anxiety   SBO (small bowel obstruction) ([place])   1. Partial small bowel obstruction. Patient is feeling better, the abdomen is less distended, intermittent nausea but no vomiting, abdominal pain controlled with analgesics. Prefers to avoid NG tube. Will continue IV fluids, IV antiemetics and as needed analgesics. Follow with abdominal films. No flatus or bowel movement. Will add famotidine.   2. HTN. Will continue as needed hydralazine and metoprolol for blood pressure control, while patient NPO, systolic blood pressure 564 mmHg. At home patient on metoprolol, losartan and HCTZ  3. Dyslipidemia. Continue to hold on statin therapy for now.   4. Asthma. No signs of exacerbation  5. Anxiety. Will continue po agents for now, patient is npo.  At home on bupropion, and alprazolam.    DVT prophylaxis: enoxaparin   Code Status:  full Family Communication: no family at the bedside  Disposition Plan: home   Consultants:     Procedures:     Antimicrobials:       Subjective: Patient is feeling better, abdomen with less distention and pain, no vomiting, but mild nausea, no flatus or bowel movement.   Objective: Vitals:   05/29/17 0200 05/29/17 0305 05/29/17 0641 05/29/17 1455  BP: 136/67 (!) 159/73 (!) 143/73 131/68  Pulse: 61 (!) 59 (!) 59 (!) 56  Resp: 16 16 15 16   Temp:  98 F (36.7 C) 98.2 F (36.8 C) 99.1 F (37.3 C)  TempSrc:  Oral Oral Oral  SpO2: 97% 99% 97% 100%  Weight:      Height:        Intake/Output Summary (Last 24 hours) at 05/29/2017 1547 Last data filed at 05/29/2017 1500 Gross per 24 hour  Intake 1690 ml  Output -  Net 1690 ml   Filed Weights   05/28/17 2145  Weight: 77.1 kg (169 lb 15.6 oz)    Examination:   General: Not in pain or dyspnea, deconditioned Neurology: Awake and alert, non focal  E ENT: mild pallor, no icterus, oral mucosa moist Cardiovascular: No JVD. S1-S2 present, rhythmic, no gallops, rubs, or murmurs. No lower extremity edema. Pulmonary: vesicular breath sounds bilaterally, adequate air movement, no wheezing, rhonchi or rales. Gastrointestinal. Abdomen distended, no organomegaly,, no rebound or guarding, tender to deep palpation.  Skin. No rashes Musculoskeletal: no joint deformities     Data Reviewed: I have personally reviewed following labs and imaging studies  CBC: Recent Labs  Lab 05/28/17 2226  05/29/17 0648  WBC 10.9* 13.5*  NEUTROABS 6.9  --   HGB 14.1 14.8  HCT 39.4 42.9  MCV 95.6 96.4  PLT 221 161   Basic Metabolic Panel: Recent Labs  Lab 05/28/17 2226 05/29/17 0648  NA 138 138  K 4.0 3.9  CL 105 101  CO2 23 23  GLUCOSE 119* 170*  BUN 27* 19  CREATININE 1.08* 0.87  CALCIUM 9.5 9.7   GFR: Estimated Creatinine Clearance:  64.8 mL/min (by C-G formula based on SCr of 0.87 mg/dL). Liver Function Tests: Recent Labs  Lab 05/28/17 2226  AST 28  ALT 25  ALKPHOS 55  BILITOT 1.2  PROT 6.8  ALBUMIN 4.6   Recent Labs  Lab 05/28/17 2226  LIPASE 33   No results for input(s): AMMONIA in the last 168 hours. Coagulation Profile: No results for input(s): INR, PROTIME in the last 168 hours. Cardiac Enzymes: No results for input(s): CKTOTAL, CKMB, CKMBINDEX, TROPONINI in the last 168 hours. BNP (last 3 results) No results for input(s): PROBNP in the last 8760 hours. HbA1C: No results for input(s): HGBA1C in the last 72 hours. CBG: No results for input(s): GLUCAP in the last 168 hours. Lipid Profile: No results for input(s): CHOL, HDL, LDLCALC, TRIG, CHOLHDL, LDLDIRECT in the last 72 hours. Thyroid Function Tests: No results for input(s): TSH, T4TOTAL, FREET4, T3FREE, THYROIDAB in the last 72 hours. Anemia Panel: No results for input(s): VITAMINB12, FOLATE, FERRITIN, TIBC, IRON, RETICCTPCT in the last 72 hours.    Radiology Studies: I have reviewed all of the imaging during this hospital visit personally     Scheduled Meds: . metoprolol tartrate  5 mg Intravenous Q8H   Continuous Infusions: . dextrose 5 % and 0.9 % NaCl with KCl 20 mEq/L 75 mL/hr at 05/29/17 0628  . famotidine (PEPCID) IV Stopped (05/29/17 1434)     LOS: 0 days        Yosselyn Tax Gerome Apley, MD [facility] Pager [phone]

## 2017-05-29 NOTE — H&P (Signed)
History and Physical    Cynthia Peters GYJ:[id number] DOB: [birth year] DOA: 05/28/2017  PCP: Aletha Halim., PA-C  Patient coming from: Home.  Chief Complaint: Abdominal pain.  HPI: Cynthia Peters is a 67 y.o. female with history of hypertension, PVCs presents to the ER admits in [facility] with sudden onset of abdominal pain.  Patient pain started last evening around 8 PM while at home.  Pain is periumbilical and lower quadrants.  Has some nausea denies vomiting.  Patient had multiple bowel movements prior to the episode starting.  Denies any chest pain or shortness of breath fever or chills.  ED Course: In the ER CT scan of the abdomen and pelvis done shows possible partial small bowel obstruction and ER physician had discussed with on-call general surgeon Dr. Leighton Ruff.  Patient is being admitted for further observation and management.  On my exam patient still complains of diffuse abdominal pain.    Review of Systems: As per HPI, rest all negative.   Past Medical History:  Diagnosis Date  . Anemia    history in the past, not on a regular basis  . Arthritis   . Bronchitis   . Cataracts, bilateral    immature  . Claustrophobia    takes Valium daily as needed  . Complication of anesthesia    2013 SURGERY vocal cord bothering pt, used smaller ent tube after 2 hernia repair, no problem after . " I woke up during my MRI and colonoscopy."       . Depression    takes Wellbutrin daily  . Dry eye   . Dry mouth   . GERD (gastroesophageal reflux disease)    takes Omeprazole daily   . H/O hiatal hernia   . Headache    migraines with flashing lights   . History of blood clots 40 yrs    leg   . History of echocardiogram 2013   a. Echo (05/2011): Mild LVH, EF 65-70%, no WMA, Gr 1 DD, mild AI.  Marland Kitchen History of MRSA infection   . History of staph infection   . History of stress test 2010   a. ETT-Echo in 2010 was normal  . History of TIA (transient ischemic attack)  2013   a. Carotid US (05/2011): No ICA stenosis, pt unsure of this  . Hyperlipidemia    takes Simvastatin daily   . Hypertension    takes  Losartan daily  . IBS (irritable bowel syndrome)    takes Bentyl daily as needed  . Insomnia    takes Ambien nightly  . Interstitial cystitis   . Lichen planus   . Mild asthma    Albuterol inhaler as needed  . Palpitations    Event monitor in 2007 with rare PVCs // takes Metoprolol daily  . PVC (premature ventricular contraction)   . Seasonal allergies    takes Claritin daily as needed.Takes Singulair daily as needed.  Marland Kitchen Urethral stricture   . Vertigo    takes Meclizine daily as needed    Past Surgical History:  Procedure Laterality Date  . CESAREAN SECTION    . COLONOSCOPY WITH PROPOFOL N/A 08/10/2012   Procedure: COLONOSCOPY WITH PROPOFOL;  Surgeon: Garlan Fair, MD;  Location: [facility];  Service: Endoscopy;  Laterality: N/A;  . CYSTOSCOPY WITH URETHRAL DILATATION N/A 02/18/2013   Procedure: CYSTOSCOPY WITH URETHRAL DILATATION ( NO BALLOON) AND HYDRODISTENSION;  Surgeon: Alexis Frock, MD;  Location: [facility];  Service: Urology;  Laterality:  N/A;  . ENDOMETRIAL ABLATION W/ HYDROTHERMABLATOR  09-27-2002  . INCISIONAL HERNIA REPAIR N/A 10/25/2014   Procedure: HERNIA REPAIR INCISIONAL;  Surgeon: Ralene Ok, MD;  Location: [place];  Service: General;  Laterality: N/A;  . INCISIONAL HERNIA REPAIR N/A 03/26/2015   Procedure: LAPAROSCOPIC INCISIONAL HERNIA;  Surgeon: Ralene Ok, MD;  Location: [place];  Service: General;  Laterality: N/A;  . INSERTION OF MESH N/A 10/25/2014   Procedure: INSERTION OF MESH;  Surgeon: Ralene Ok, MD;  Location: [place];  Service: General;  Laterality: N/A;  . LAPAROSCOPIC LYSIS OF ADHESIONS N/A 03/26/2015   Procedure: LAPAROSCOPIC LYSIS OF ADHESIONS;  Surgeon: Ralene Ok, MD;  Location: [place];  Service: General;  Laterality: N/A;  . LAPAROTOMY N/A 10/25/2014   Procedure:  EXPLORATORY LAPAROTOMY;  Surgeon: Ralene Ok, MD;  Location: [place];  Service: General;  Laterality: N/A;  . LYSIS OF ADHESION N/A 10/25/2014   Procedure: LYSIS OF ADHESIONS ;  Surgeon: Ralene Ok, MD;  Location: [place];  Service: General;  Laterality: N/A;  . MUCOSAL ADVANCEMENT FLAP N/A 10/25/2014   Procedure: MUCOSAL ADVANCEMENT FLAP;  Surgeon: Ralene Ok, MD;  Location: [place];  Service: General;  Laterality: N/A;  . ORIF RADIAL FRACTURE Right 06/17/2016   Procedure: right radial head arthroplasty with ligament repair, reconstruciton;  Surgeon: Roseanne Kaufman, MD;  Location: [place];  Service: Orthopedics;  Laterality: Right;  . RADIAL HEAD ARTHROPLASTY Right 06/17/2016  . TOOTH EXTRACTION    . TRANSTHORACIC ECHOCARDIOGRAM  05-26-2011   MILD LVH/  EF 41-96%/  GRADE I DIASTOLIC DYSFUNCTION/  MILD AR//   06-20-2008  NOMRAL STRESS ECHO  . UMBILICAL HERNIA REPAIR  2013   AND REVISION THE SAME YEAR W/ MESH  . WISDOM TOOTH EXTRACTION  AGE 76     reports that she quit smoking about 41 years ago. Her smoking use included cigarettes. She has a 0.50 pack-year smoking history. She has never used smokeless tobacco. She reports that she drinks alcohol. She reports that she does not use drugs.  Allergies  Allergen Reactions  . Hydrocodone Rash  . Hydrocodone-Acetaminophen Rash  . Tramadol Palpitations    Family History  Problem Relation Age of Onset  . Dementia Mother   . Leukemia Child   . Cirrhosis Father     Prior to Admission medications   Medication Sig Start Date End Date Taking? Authorizing Provider  acetaminophen (TYLENOL) 500 MG tablet Take 1,000 mg by mouth every 6 (six) hours as needed (pain).    Yes [provider]  albuterol (PROVENTIL HFA;VENTOLIN HFA) 108 (90 BASE) MCG/ACT inhaler Inhale 2 puffs into the lungs every 6 (six) hours as needed for wheezing or shortness of breath.   Yes [provider]  ALPRAZolam Duanne Moron) 0.25 MG tablet Take I pill every  6 hours as needed for anxiety 06/18/16  Yes Avelina Laine, PA-C  aspirin 81 MG chewable tablet Chew 81 mg by mouth daily.   Yes [provider]  buPROPion (WELLBUTRIN XL) 300 MG 24 hr tablet Take 300 mg by mouth daily.    Yes [provider]  Cholecalciferol (VITAMIN D) 2000 units CAPS Take 2,000 Units by mouth daily.   Yes [provider]  clobetasol cream (TEMOVATE) 2.22 % Apply 1 application topically 2 (two) times daily as needed. 04/01/16  Yes [provider]  Coenzyme Q10 (CO Q 10) 100 MG CAPS Take 100 mg by mouth daily.    Yes [provider]  [employer] VITAMIN C 1000  MG tablet Take 1,000 mg by mouth 2 (two) times daily.  06/13/16  Yes [provider]  cycloSPORINE (RESTASIS) 0.05 % ophthalmic emulsion Place 1 drop into both eyes 2 (two) times daily.   Yes [provider]  dexamethasone (DECADRON) 0.5 MG/5ML solution TAKE 10 ML BY MOUTH daily as needed FOR LICHENS  (AS A MOUTHRINSE) 09/01/14  Yes [provider]  dicyclomine (BENTYL) 10 MG capsule Take 10 mg by mouth every 4 (four) hours as needed (for abdominal cramps).  02/13/15  Yes [provider]  Evening Primrose Oil 1000 MG CAPS Take 1,000 mg by mouth 2 (two) times daily.    Yes [provider]  Fexofenadine HCl (ALLEGRA PO) Take 1 tablet by mouth daily as needed (ALLERGIES).   Yes [provider]  Flaxseed, Linseed, 1000 MG CAPS Take 1,000 mg by mouth 2 (two) times daily.    Yes [provider]  fluticasone (FLONASE) 50 MCG/ACT nasal spray Place 2 sprays into both nostrils daily as needed for allergies or rhinitis.    Yes [provider]  hydrochlorothiazide 25 MG tablet Take 25 mg by mouth daily.    Yes [provider]  HYDROCORTISONE ACE, RECTAL, 30 MG SUPP Place 30 mg rectally 2 (two) times daily as needed for hemorrhoids. 04/07/16  Yes [provider]  ibuprofen (ADVIL,MOTRIN) 200 MG tablet Take 400-600 mg by  mouth 2 (two) times daily as needed (pain).    Yes [provider]  indomethacin (INDOCIN SR) 75 MG CR capsule Take 75 mg by mouth daily. with food 06/13/16  Yes [provider]  lidocaine (XYLOCAINE) 5 % ointment Apply 1 application topically 3 (three) times daily as needed for mild pain or moderate pain.   Yes [provider]  losartan (COZAAR) 100 MG tablet TAKE 1 TABLET BY MOUTH EVERY DAY 09/22/16  Yes Martinique, Peter M, MD  meclizine (ANTIVERT) 25 MG tablet Take 1 tablet (25 mg total) by mouth 3 (three) times daily as needed for dizziness. 04/14/13  Yes Molpus, John, MD  metoprolol succinate (TOPROL-XL) 100 MG 24 hr tablet Take 1 tablet in the morning and 1/2 tablet (50 mg) in the evening. Take with or immediately following a meal. 08/29/16  Yes Rosita Fire, Brittainy M, PA-C  montelukast (SINGULAIR) 10 MG tablet Take 10 mg by mouth daily.    Yes [provider]  Multiple Vitamin (MULTIVITAMIN WITH MINERALS) TABS Take 1 tablet by mouth daily.   Yes [provider]  nystatin (MYCOSTATIN) 100000 UNIT/ML suspension Take 5 mLs by mouth daily as needed (FLARES, LICHEN PLANTIS).   Yes [provider]  nystatin cream (MYCOSTATIN) Apply 1 application topically 2 (two) times daily as needed for dry skin.   Yes [provider]  omeprazole (PRILOSEC) 20 MG capsule Take 20 mg by mouth daily.   Yes [provider]  ondansetron (ZOFRAN ODT) 4 MG disintegrating tablet 4mg  ODT q4 hours prn nausea/vomit 11/02/16  Yes Floyd, Dan, DO  PAZEO 0.7 % SOLN Place 1 drop into both eyes daily as needed for allergies. 06/05/16  Yes [provider]  phenazopyridine (PYRIDIUM) 200 MG tablet Take 200 mg by mouth 3 (three) times daily as needed for pain [place] TRACT PAIN).   Yes [provider]  potassium chloride SA (KLOR-CON M20) 20 MEQ tablet Take 1.5 tablets (30 mEq total) by mouth daily. 08/29/16  Yes Simmons, Brittainy M, PA-C  PREVIDENT 5000  DRY MOUTH 1.1 % GEL dental gel USE ONCE  DAILY IN PLACE OF REGULAR TOOTHEPASTE 09/01/14  Yes [provider]  Probiotic Product (ALIGN PO) Take 1 capsule by mouth 2 (two) times daily.    Yes [provider]  simvastatin (ZOCOR) 40 MG tablet Take 40 mg by mouth daily.    Yes [provider]  valACYclovir (VALTREX) 500 MG tablet INCREASE TO TAKING 1 TABLET 2 TIMES A DAY FOR 3 DAYS WITH OUTBREAK Patient taking differently: TAKE 1 TABLET BY MOUTH DAILY 03/25/16  Yes Kem Boroughs, FNP    Physical Exam: Vitals:   05/28/17 2354 05/29/17 0141 05/29/17 0200 05/29/17 0305  BP: (!) 143/73 (!) 145/69 136/67 (!) 159/73  Pulse: (!) 57 (!) 55 61 (!) 59  Resp: 18 18 16 16   Temp:  98.4 F (36.9 C)  98 F (36.7 C)  TempSrc:  Oral  Oral  SpO2: 100% 100% 97% 99%  Weight:      Height:          Constitutional: Moderately built and nourished. Vitals:   05/28/17 2354 05/29/17 0141 05/29/17 0200 05/29/17 0305  BP: (!) 143/73 (!) 145/69 136/67 (!) 159/73  Pulse: (!) 57 (!) 55 61 (!) 59  Resp: 18 18 16 16   Temp:  98.4 F (36.9 C)  98 F (36.7 C)  TempSrc:  Oral  Oral  SpO2: 100% 100% 97% 99%  Weight:      Height:       Eyes: Anicteric no pallor. ENMT: No discharge from the ears uppercase and also Motrin Neck: No mass felt.  No neck rigidity. Respiratory: No rhonchi or crepitations. Cardiovascular: S1-S2 heard no murmurs appreciated. Abdomen: Soft distended bowel sounds not appreciated no guarding or rigidity. Musculoskeletal: No edema.  No joint effusion. Skin: No rash. Neurologic: Alert awake oriented to time place and person.  Moves all extremities. Psychiatric: Appears normal.  Normal affect.   Labs on Admission: I have personally reviewed following labs and imaging studies  CBC: Recent Labs  Lab 05/28/17 2226  WBC 10.9*  NEUTROABS 6.9  HGB 14.1  HCT 39.4  MCV 95.6  PLT 782   Basic Metabolic Panel: Recent Labs  Lab 05/28/17 2226  NA 138  K 4.0    CL 105  CO2 23  GLUCOSE 119*  BUN 27*  CREATININE 1.08*  CALCIUM 9.5   GFR: Estimated Creatinine Clearance: 52.2 mL/min (A) (by C-G formula based on SCr of 1.08 mg/dL (H)). Liver Function Tests: Recent Labs  Lab 05/28/17 2226  AST 28  ALT 25  ALKPHOS 55  BILITOT 1.2  PROT 6.8  ALBUMIN 4.6   Recent Labs  Lab 05/28/17 2226  LIPASE 33   No results for input(s): AMMONIA in the last 168 hours. Coagulation Profile: No results for input(s): INR, PROTIME in the last 168 hours. Cardiac Enzymes: No results for input(s): CKTOTAL, CKMB, CKMBINDEX, TROPONINI in the last 168 hours. BNP (last 3 results) No results for input(s): PROBNP in the last 8760 hours. HbA1C: No results for input(s): HGBA1C in the last 72 hours. CBG: No results for input(s): GLUCAP in the last 168 hours. Lipid Profile: No results for input(s): CHOL, HDL, LDLCALC, TRIG, CHOLHDL, LDLDIRECT in the last 72 hours. Thyroid Function Tests: No results for input(s): TSH, T4TOTAL, FREET4, T3FREE, THYROIDAB in the last 72 hours. Anemia Panel: No results for input(s): VITAMINB12, FOLATE, FERRITIN, TIBC, IRON, RETICCTPCT in the last 72 hours. Urine analysis:    Component Value Date/Time   COLORURINE YELLOW 05/28/2017 2216   Peculiar 05/28/2017 2216  LABSPEC <1.005 (L) 05/28/2017 2216   PHURINE 6.5 05/28/2017 2216   GLUCOSEU NEGATIVE 05/28/2017 2216   HGBUR NEGATIVE 05/28/2017 2216   BILIRUBINUR NEGATIVE 05/28/2017 2216   BILIRUBINUR n 10/20/2014 0949   KETONESUR NEGATIVE 05/28/2017 2216   PROTEINUR NEGATIVE 05/28/2017 2216   UROBILINOGEN negative 10/20/2014 0949   UROBILINOGEN 0.2 09/09/2014 0942   NITRITE NEGATIVE 05/28/2017 2216   LEUKOCYTESUR NEGATIVE 05/28/2017 2216   Sepsis Labs: @LABRCNTIP (procalcitonin:4,lacticidven:4) )No results found for this or any previous visit (from the past 240 hour(s)).   Radiological Exams on Admission: Ct Abdomen Pelvis W Contrast  Result Date:  05/29/2017 CLINICAL DATA:  Mid abdominal pain for 5 hours. Abdominal distention. History of irritable bowel syndrome, ventral hernia repair, hiatal hernia, antibiotics for tonsil stone. EXAM: CT ABDOMEN AND PELVIS WITH CONTRAST TECHNIQUE: Multidetector CT imaging of the abdomen and pelvis was performed using the standard protocol following bolus administration of intravenous contrast. CONTRAST:  138mL ISOVUE-300 IOPAMIDOL (ISOVUE-300) INJECTION 61% COMPARISON:  03/12/2016 FINDINGS: Lower chest: Lung bases are clear. Hepatobiliary: Mild diffuse fatty infiltration of the liver. Scattered cysts in the liver, largest in the dome measuring 2.4 cm diameter. No change since previous study. Gallbladder and bile ducts are unremarkable. Pancreas: Unremarkable. No pancreatic ductal dilatation or surrounding inflammatory changes. Spleen: Normal in size without focal abnormality. Adrenals/Urinary Tract: Adrenal glands are unremarkable. Kidneys are normal, without renal calculi, focal lesion, or hydronephrosis. Bladder is unremarkable. Stomach/Bowel: Gastric wall is not thickened. Proximal small bowel are mildly dilated and fluid-filled with transition zone in the right lower quadrant. Distal small bowel are decompressed. Appearance is consistent with partial obstruction. There appears to be some regional wall thickening at the zone of transition suggesting an inflammatory etiology. No pneumatosis or portal venous gas. Colon is decompressed. Scattered stool in the colon. Appendix is not identified. Vascular/Lymphatic: Aortic atherosclerosis. No enlarged abdominal or pelvic lymph nodes. Reproductive: Uterus and bilateral adnexa are unremarkable. Other: Small amount of free fluid in the abdomen and pelvis with mild mesenteric edema, likely reactive. Small right inguinal hernia containing fat. Scarring in the anterior abdominal wall consistent with previous postoperative change. Musculoskeletal: No acute or significant osseous  findings. 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 IMPRESSION: 1. Changes of partial small bowel obstruction with transition zone in the right lower quadrant. Regional wall thickening in the area of transition suggest inflammatory etiology. Small amount of free fluid in the abdomen and pelvis with mesenteric edema likely reactive. 2. Diffuse fatty infiltration of the liver. 3. Benign-appearing hepatic cysts. Electronically Signed   By: Lucienne Capers M.D.   On: 05/29/2017 00:49     Assessment/Plan Principal Problem:   Partial small bowel obstruction (HCC) Active Problems:   PVC's (premature ventricular contractions)   Hypertension   Anxiety   SBO (small bowel obstruction) ([place])    1. Partial small bowel obstruction -we will keep patient n.p.o. IV fluids and pain relief medications.  Patient states she has severe anxiety and does not want to be placed on NG tube.  General surgery has been consulted and will follow their recommendations. 2. Hypertension -since patient is n.p.o. we will keep patient on as needed IV hydralazine. 3. History of PVCs -since patient is n.p.o. I will place patient on scheduled dose of IV metoprolol. 4. History of asthma -presently not wheezing.   DVT prophylaxis: SCDs. Code Status: Full code. Family Communication: Discussed with patient. Disposition Plan: Home. Consults called: General surgery. Admission status: Inpatient.   Rise Patience MD [facility] Pager  [place] [phone].  If 7PM-7AM, please contact night-coverage [email or web address] Password [email or web address]  05/29/2017, 4:30 AM

## 2017-05-29 NOTE — ED Notes (Signed)
ED Provider at bedside. 

## 2017-05-29 NOTE — Progress Notes (Addendum)
PROGRESS NOTE    Cynthia Peters  XBJ:[id number] DOB: January 03, 1951 DOA: 05/28/2017 PCP: Aletha Halim., PA-C  Outpatient Specialists:   Brief Narrative:  67 year old female with history of 4 abdominal hernia repairs since 2013, irritable bowel syndrome, premature ventricular contractions, hypertension, hyperlipidemia, asthma, vertigo, and anxiety/depression. Patient presented to ED with periumbilical and lower quadrant abdominal pain and nausea for 3-4 hours with no history of vomiting. Patient had many bowel movements before the episode of abdominal pain. In ED, Cr 1.08, WBC 10.9, CT of abdomen showed partial small bowel obstruction with transition zone in the right lower quadrant with regional wall thickening suggestive of inflammatory etiology, diffuse fatty infiltration of liver. Patient was admitted for further observation and management of partial small bowel obstruction.   Assessment & Plan:   Partial Small Bowel Obstruction: - CT abdomen showed partial small bowel obstruction with transition zone in right lower quadrant - most likely due to adhesions from multiple abdominal procedures over last 6 years - will continue npo, IV fluids, pain medication, zofran - patient has extreme anxiety regarding placement of NG tube  - continue to observe for flatus and bowel movements - repeat abdominal films - IV pepcid  History of PVC's: - continue IV Lopressor as patient continues to be npo - continue IV dextrose with NaCl with KCl infusion  Hypertension: - continue IV hydralazine since patient is still npo  Anxiety/Depression: - patient states this is controlled at this time  Asthma: - no symptoms at this time  DVT prophylaxis: SCD's Code Status: Full Family Communication: No family at bedside Disposition Plan: Home when clinically stable  Consultants:   General Surgery  Procedures:   None  Antimicrobials:   None   Subjective: Patient is alert and oriented  with no abdominal pain at this time. States that abdominal pain and nausea felt in ED has been controlled with medication. Denies vomiting, chest pain, or shortness of breath. Patient has not had a bowel movement since coming to ED. Patient is extremely anxious regarding possibility of NG tube and states that her extreme claustrophobia may not allow her to do it.   Objective: Vitals:   05/29/17 0141 05/29/17 0200 05/29/17 0305 05/29/17 0641  BP: (!) 145/69 136/67 (!) 159/73 (!) 143/73  Pulse: (!) 55 61 (!) 59 (!) 59  Resp: 18 16 16 15   Temp: 98.4 F (36.9 C)  98 F (36.7 C) 98.2 F (36.8 C)  TempSrc: Oral  Oral Oral  SpO2: 100% 97% 99% 97%  Weight:      Height:        Intake/Output Summary (Last 24 hours) at 05/29/2017 0948 Last data filed at 05/29/2017 0800 Gross per 24 hour  Intake 1115 ml  Output -  Net 1115 ml   Filed Weights   05/28/17 2145  Weight: 77.1 kg (169 lb 15.6 oz)    Examination:  General exam: Appears calm and comfortable, in no acute distress  Respiratory system: Clear to auscultation. Respiratory effort normal. Cardiovascular system: S1 & S2 heard, RRR. No peripheral edema.  Gastrointestinal system: Abdomen is distended, soft and no guarding. Hypoactive bowel sounds. Central nervous system: Alert and oriented. No focal neurological deficits. Skin: No rashes, lesions or ulcers Psychiatry: Mood & affect appropriate.   Data Reviewed: I have personally reviewed following labs and imaging studies  CBC: Recent Labs  Lab 05/28/17 2226 05/29/17 0648  WBC 10.9* 13.5*  NEUTROABS 6.9  --   HGB 14.1 14.8  HCT 39.4  42.9  MCV 95.6 96.4  PLT 221 154   Basic Metabolic Panel: Recent Labs  Lab 05/28/17 2226 05/29/17 0648  NA 138 138  K 4.0 3.9  CL 105 101  CO2 23 23  GLUCOSE 119* 170*  BUN 27* 19  CREATININE 1.08* 0.87  CALCIUM 9.5 9.7   GFR: Estimated Creatinine Clearance: 64.8 mL/min (by C-G formula based on SCr of 0.87 mg/dL). Liver Function  Tests: Recent Labs  Lab 05/28/17 2226  AST 28  ALT 25  ALKPHOS 55  BILITOT 1.2  PROT 6.8  ALBUMIN 4.6   Recent Labs  Lab 05/28/17 2226  LIPASE 33   No results for input(s): AMMONIA in the last 168 hours. Coagulation Profile: No results for input(s): INR, PROTIME in the last 168 hours. Cardiac Enzymes: No results for input(s): CKTOTAL, CKMB, CKMBINDEX, TROPONINI in the last 168 hours. BNP (last 3 results) No results for input(s): PROBNP in the last 8760 hours. HbA1C: No results for input(s): HGBA1C in the last 72 hours. CBG: No results for input(s): GLUCAP in the last 168 hours. Lipid Profile: No results for input(s): CHOL, HDL, LDLCALC, TRIG, CHOLHDL, LDLDIRECT in the last 72 hours. Thyroid Function Tests: No results for input(s): TSH, T4TOTAL, FREET4, T3FREE, THYROIDAB in the last 72 hours. Anemia Panel: No results for input(s): VITAMINB12, FOLATE, FERRITIN, TIBC, IRON, RETICCTPCT in the last 72 hours. Urine analysis:    Component Value Date/Time   COLORURINE YELLOW 05/28/2017 2216   APPEARANCEUR CLEAR 05/28/2017 2216   LABSPEC <1.005 (L) 05/28/2017 2216   PHURINE 6.5 05/28/2017 2216   GLUCOSEU NEGATIVE 05/28/2017 2216   HGBUR NEGATIVE 05/28/2017 2216   BILIRUBINUR NEGATIVE 05/28/2017 2216   BILIRUBINUR n 10/20/2014 [place] 05/28/2017 2216   PROTEINUR NEGATIVE 05/28/2017 2216   UROBILINOGEN negative 10/20/2014 0949   UROBILINOGEN 0.2 09/09/2014 0942   NITRITE NEGATIVE 05/28/2017 2216   LEUKOCYTESUR NEGATIVE 05/28/2017 2216   Sepsis Labs: @LABRCNTIP (procalcitonin:4,lacticidven:4)  )No results found for this or any previous visit (from the past 240 hour(s)).       Radiology Studies: Ct Abdomen Pelvis W Contrast  Result Date: 05/29/2017 CLINICAL DATA:  Mid abdominal pain for 5 hours. Abdominal distention. History of irritable bowel syndrome, ventral hernia repair, hiatal hernia, antibiotics for tonsil stone. EXAM: CT ABDOMEN AND PELVIS  WITH CONTRAST TECHNIQUE: Multidetector CT imaging of the abdomen and pelvis was performed using the standard protocol following bolus administration of intravenous contrast. CONTRAST:  1109mL ISOVUE-300 IOPAMIDOL (ISOVUE-300) INJECTION 61% COMPARISON:  03/12/2016 FINDINGS: Lower chest: Lung bases are clear. Hepatobiliary: Mild diffuse fatty infiltration of the liver. Scattered cysts in the liver, largest in the dome measuring 2.4 cm diameter. No change since previous study. Gallbladder and bile ducts are unremarkable. Pancreas: Unremarkable. No pancreatic ductal dilatation or surrounding inflammatory changes. Spleen: Normal in size without focal abnormality. Adrenals/Urinary Tract: Adrenal glands are unremarkable. Kidneys are normal, without renal calculi, focal lesion, or hydronephrosis. Bladder is unremarkable. Stomach/Bowel: Gastric wall is not thickened. Proximal small bowel are mildly dilated and fluid-filled with transition zone in the right lower quadrant. Distal small bowel are decompressed. Appearance is consistent with partial obstruction. There appears to be some regional wall thickening at the zone of transition suggesting an inflammatory etiology. No pneumatosis or portal venous gas. Colon is decompressed. Scattered stool in the colon. Appendix is not identified. Vascular/Lymphatic: Aortic atherosclerosis. No enlarged abdominal or pelvic lymph nodes. Reproductive: Uterus and bilateral adnexa are unremarkable. Other: Small amount of free fluid in the  abdomen and pelvis with mild mesenteric edema, likely reactive. Small right inguinal hernia containing fat. Scarring in the anterior abdominal wall consistent with previous postoperative change. Musculoskeletal: No acute or significant osseous findings. 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 IMPRESSION: 1. Changes of partial small bowel obstruction with transition zone in the right lower quadrant. Regional wall thickening in the area of transition  suggest inflammatory etiology. Small amount of free fluid in the abdomen and pelvis with mesenteric edema likely reactive. 2. Diffuse fatty infiltration of the liver. 3. Benign-appearing hepatic cysts. Electronically Signed   By: Lucienne Capers M.D.   On: 05/29/2017 00:49        Scheduled Meds: . metoprolol tartrate  5 mg Intravenous Q8H   Continuous Infusions: . dextrose 5 % and 0.9 % NaCl with KCl 20 mEq/L 75 mL/hr at 05/29/17 0628     LOS: 0 days    Time spent: 25 min  Merlene Pulling, PA-S [facility] If 7PM-7AM, please contact night-coverage [email or web address] Password [facility] 05/29/2017, 9:48 AM

## 2017-05-30 ENCOUNTER — Inpatient Hospital Stay (HOSPITAL_COMMUNITY): Payer: Medicare Other

## 2017-05-30 LAB — BASIC METABOLIC PANEL
Anion gap: 8 (ref 5–15)
BUN: 12 mg/dL (ref 6–20)
CO2: 28 mmol/L (ref 22–32)
Calcium: 9 mg/dL (ref 8.9–10.3)
Chloride: 108 mmol/L (ref 101–111)
Creatinine, Ser: 0.85 mg/dL (ref 0.44–1.00)
GFR calc Af Amer: >60 mL/min (ref >60)
GFR calc non Af Amer: >60 mL/min (ref >60)
Glucose, Bld: 120 mg/dL — ABNORMAL HIGH (ref 65–99)
Potassium: 3.6 mmol/L (ref 3.5–5.1)
Sodium: 144 mmol/L (ref 135–145)

## 2017-05-30 LAB — CBC WITH DIFFERENTIAL/PLATELET
Basophils Absolute: 0 10*3/uL (ref 0.0–0.1)
Basophils Relative: 0 %
Eosinophils Absolute: 0.7 10*3/uL (ref 0.0–0.7)
Eosinophils Relative: 7 %
HCT: 37.3 % (ref 36.0–46.0)
Hemoglobin: 12.6 g/dL (ref 12.0–15.0)
Lymphocytes Relative: 21 %
Lymphs Abs: 2.1 10*3/uL (ref 0.7–4.0)
MCH: 33 pg (ref 26.0–34.0)
MCHC: 33.8 g/dL (ref 30.0–36.0)
MCV: 97.6 fL (ref 78.0–100.0)
Monocytes Absolute: 1.2 10*3/uL — ABNORMAL HIGH (ref 0.1–1.0)
Monocytes Relative: 11 %
Neutro Abs: 6.2 10*3/uL (ref 1.7–7.7)
Neutrophils Relative %: 61 %
Platelets: 196 10*3/uL (ref 150–400)
RBC: 3.82 MIL/uL — ABNORMAL LOW (ref 3.87–5.11)
RDW: 12.8 % (ref 11.5–15.5)
WBC: 10.1 10*3/uL (ref 4.0–10.5)

## 2017-05-30 LAB — GLUCOSE, CAPILLARY: Glucose-Capillary: 94 mg/dL (ref 65–99)

## 2017-05-30 MED ORDER — SIMVASTATIN 40 MG PO TABS: 40.0000 mg | ORAL_TABLET | Freq: Every day | ORAL | Status: DC

## 2017-05-30 MED ORDER — MECLIZINE HCL 25 MG PO TABS: 25.0000 mg | ORAL_TABLET | Freq: Three times a day (TID) | ORAL | Status: DC | PRN

## 2017-05-30 MED ORDER — FLUTICASONE PROPIONATE 50 MCG/ACT NA SUSP
2.0000 | Freq: Every day | NASAL | Status: DC
  Administered 2017-05-30 – 2017-05-31 (×2): 2 via NASAL
  Filled 2017-05-30: qty 16

## 2017-05-30 MED ORDER — POTASSIUM CHLORIDE 20 MEQ/15ML (10%) PO SOLN
30.0000 meq | Freq: Every day | ORAL | Status: DC
  Administered 2017-05-31: 30 meq via ORAL
  Filled 2017-05-30: qty 30

## 2017-05-30 MED ORDER — ALPRAZOLAM 0.25 MG PO TABS: 0.2500 mg | ORAL_TABLET | Freq: Four times a day (QID) | ORAL | Status: DC | PRN

## 2017-05-30 MED ORDER — BUPROPION HCL ER (XL) 300 MG PO TB24
300.0000 mg | ORAL_TABLET | Freq: Every day | ORAL | Status: DC
  Administered 2017-05-30 – 2017-05-31 (×2): 300 mg via ORAL
  Filled 2017-05-30 (×2): qty 1

## 2017-05-30 MED ORDER — CYCLOSPORINE 0.05 % OP EMUL
1.0000 [drp] | Freq: Two times a day (BID) | OPHTHALMIC | Status: DC
  Administered 2017-05-30 – 2017-05-31 (×3): 1 [drp] via OPHTHALMIC
  Filled 2017-05-30 (×4): qty 1

## 2017-05-30 MED ORDER — FAMOTIDINE 20 MG PO TABS
20.0000 mg | ORAL_TABLET | Freq: Two times a day (BID) | ORAL | Status: DC
  Administered 2017-05-30 – 2017-05-31 (×2): 20 mg via ORAL
  Filled 2017-05-30 (×2): qty 1

## 2017-05-30 MED ORDER — METOPROLOL TARTRATE 25 MG PO TABS
25.0000 mg | ORAL_TABLET | Freq: Two times a day (BID) | ORAL | Status: DC
  Administered 2017-05-30 – 2017-05-31 (×3): 25 mg via ORAL
  Filled 2017-05-30 (×3): qty 1

## 2017-05-30 MED ORDER — MONTELUKAST SODIUM 10 MG PO TABS
10.0000 mg | ORAL_TABLET | Freq: Every day | ORAL | Status: DC
  Administered 2017-05-30: 10 mg via ORAL
  Filled 2017-05-30: qty 1

## 2017-05-30 NOTE — Progress Notes (Signed)
PROGRESS NOTE    Cynthia Peters  DZH:[id number] DOB: 11/04/1950 DOA: 05/28/2017 PCP: Aletha Halim., PA-C    Brief Narrative:  67 year old female who presented with abdominal pain.  Patient does have significant past medical history for hypertension, irritable bowel syndrome, dyslipidemia, asthma, vertigo, anxiety/depression and history of 4 abdominal hernia repairs since 2013.   Abdominal pain was periumbilical, radiated to the lower quadrants, constant and persistent.  On the initial physical examination blood pressure 143/73, heart rate 57, respiratory 18, oxygen saturation 100%, temperature 98.4.  Dry mucous membranes, lungs clear to auscultation, heart S1-S2 present and rhythmic, the abdomen was soft, distended, decreased bowel sounds, no rigidity, no lower extremity edema.  CT of the abdomen with a partial small bowel obstruction with transition zone in the right lower quadrant.  Patient was admitted to the hospital with the working diagnosis of partial small bowel obstruction   Assessment & Plan:   Principal Problem:   Partial small bowel obstruction (HCC) Active Problems:   PVC's (premature ventricular contractions)   Hypertension   Anxiety   SBO (small bowel obstruction) ([place])   1. Partial small bowel obstruction. Clinically continue to improve, patient was able to pass gas this am, no bowel movement yet, no nausea or vomiting, improved abdominal distention. Abdominal films personally reviewed noted no worsening gas patter, will advance diet to clear liquids with close monitoring.   2. HTN. On as needed hydralazine and scheduled metoprolol IV, blood pressure 419 to 622 systolic, will resume oral metoprolol and will continue to hold on hctz and losartan for now to prevent hypotension.   3. Dyslipidemia. Resume simvastatin.   4. Asthma. No current signs of exacerbation  5. Anxiety. Will resume bupropion, and alprazolam. No confusion or agitation.    DVT  prophylaxis: enoxaparin   Code Status:  full Family Communication: no family at the bedside  Disposition Plan: home   Consultants:     Procedures:     Antimicrobials:     Subjective: Patient is feeling better, had nausea and abdominal pain last night, non this am, positive flatus but no bowel movement.   Objective: Vitals:   05/29/17 1455 05/29/17 2005 05/29/17 2227 05/30/17 0616  BP: 131/68 126/66 133/64 (!) 115/51  Pulse: (!) 56 (!) 58 (!) 53 (!) 58  Resp: 16 16 17 16   Temp: 99.1 F (37.3 C) 99 F (37.2 C) 98.9 F (37.2 C) 98.7 F (37.1 C)  TempSrc: Oral Oral Oral Oral  SpO2: 100% 100% 99% 98%  Weight:      Height:        Intake/Output Summary (Last 24 hours) at 05/30/2017 0947 Last data filed at 05/30/2017 0600 Gross per 24 hour  Intake 1750 ml  Output -  Net 1750 ml   Filed Weights   05/28/17 2145  Weight: 77.1 kg (169 lb 15.6 oz)    Examination:   General: Not in pain or dyspnea, deconditioned Neurology: Awake and alert, non focal  E ENT: mid pallor, no icterus, oral mucosa dry Cardiovascular: No JVD. S1-S2 present, rhythmic, no gallops, rubs, or murmurs. No lower extremity edema. Pulmonary: vesicular breath sounds bilaterally, adequate air movement, no wheezing, rhonchi or rales. Gastrointestinal. Abdomen distended, no organomegaly, non tender, no rebound or guarding, positive bowel sounds.  Skin. No rashes Musculoskeletal: no joint deformities     Data Reviewed: I have personally reviewed following labs and imaging studies  CBC: Recent Labs  Lab 05/28/17 2226 05/29/17 0648 05/30/17 0345  WBC 10.9* 13.5*  10.1  NEUTROABS 6.9  --  6.2  HGB 14.1 14.8 12.6  HCT 39.4 42.9 37.3  MCV 95.6 96.4 97.6  PLT 221 235 409   Basic Metabolic Panel: Recent Labs  Lab 05/28/17 2226 05/29/17 0648 05/30/17 0345  NA 138 138 144  K 4.0 3.9 3.6  CL 105 101 108  CO2 23 23 28   GLUCOSE 119* 170* 120*  BUN 27* 19 12  CREATININE 1.08* 0.87 0.85    CALCIUM 9.5 9.7 9.0   GFR: Estimated Creatinine Clearance: 66.4 mL/min (by C-G formula based on SCr of 0.85 mg/dL). Liver Function Tests: Recent Labs  Lab 05/28/17 2226  AST 28  ALT 25  ALKPHOS 55  BILITOT 1.2  PROT 6.8  ALBUMIN 4.6   Recent Labs  Lab 05/28/17 2226  LIPASE 33   No results for input(s): AMMONIA in the last 168 hours. Coagulation Profile: No results for input(s): INR, PROTIME in the last 168 hours. Cardiac Enzymes: No results for input(s): CKTOTAL, CKMB, CKMBINDEX, TROPONINI in the last 168 hours. BNP (last 3 results) No results for input(s): PROBNP in the last 8760 hours. HbA1C: No results for input(s): HGBA1C in the last 72 hours. CBG: No results for input(s): GLUCAP in the last 168 hours. Lipid Profile: No results for input(s): CHOL, HDL, LDLCALC, TRIG, CHOLHDL, LDLDIRECT in the last 72 hours. Thyroid Function Tests: No results for input(s): TSH, T4TOTAL, FREET4, T3FREE, THYROIDAB in the last 72 hours. Anemia Panel: No results for input(s): VITAMINB12, FOLATE, FERRITIN, TIBC, IRON, RETICCTPCT in the last 72 hours.    Radiology Studies: I have reviewed all of the imaging during this hospital visit personally     Scheduled Meds: . metoprolol tartrate  5 mg Intravenous Q8H   Continuous Infusions: . dextrose 5% lactated ringers 75 mL/hr at 05/30/17 0559  . famotidine (PEPCID) IV Stopped (05/29/17 2212)     LOS: 1 day        Cecil Bixby Gerome Apley, MD [facility] Pager [phone]

## 2017-05-31 LAB — BASIC METABOLIC PANEL
Anion gap: 8 (ref 5–15)
BUN: 11 mg/dL (ref 6–20)
CO2: 27 mmol/L (ref 22–32)
Calcium: 8.8 mg/dL — ABNORMAL LOW (ref 8.9–10.3)
Chloride: 107 mmol/L (ref 101–111)
Creatinine, Ser: 0.92 mg/dL (ref 0.44–1.00)
GFR calc Af Amer: >60 mL/min (ref >60)
GFR calc non Af Amer: >60 mL/min (ref >60)
Glucose, Bld: 94 mg/dL (ref 65–99)
Potassium: 3.9 mmol/L (ref 3.5–5.1)
Sodium: 142 mmol/L (ref 135–145)

## 2017-05-31 LAB — CBC WITH DIFFERENTIAL/PLATELET
Basophils Absolute: 0.1 10*3/uL (ref 0.0–0.1)
Basophils Relative: 1 %
Eosinophils Absolute: 0.7 10*3/uL (ref 0.0–0.7)
Eosinophils Relative: 9 %
HCT: 35.6 % — ABNORMAL LOW (ref 36.0–46.0)
Hemoglobin: 12.2 g/dL (ref 12.0–15.0)
Lymphocytes Relative: 27 %
Lymphs Abs: 2.1 10*3/uL (ref 0.7–4.0)
MCH: 33.4 pg (ref 26.0–34.0)
MCHC: 34.3 g/dL (ref 30.0–36.0)
MCV: 97.5 fL (ref 78.0–100.0)
Monocytes Absolute: 1 10*3/uL (ref 0.1–1.0)
Monocytes Relative: 13 %
Neutro Abs: 3.9 10*3/uL (ref 1.7–7.7)
Neutrophils Relative %: 50 %
Platelets: 186 10*3/uL (ref 150–400)
RBC: 3.65 MIL/uL — ABNORMAL LOW (ref 3.87–5.11)
RDW: 12.7 % (ref 11.5–15.5)
WBC: 7.7 10*3/uL (ref 4.0–10.5)

## 2017-05-31 MED ORDER — POTASSIUM CHLORIDE CRYS ER 20 MEQ PO TBCR
40.0000 meq | EXTENDED_RELEASE_TABLET | Freq: Every day | ORAL | Status: DC
  Administered 2017-05-31: 40 meq via ORAL
  Filled 2017-05-31: qty 2

## 2017-05-31 MED ORDER — SUCRALFATE 1 GM/10ML PO SUSP
1.0000 g | Freq: Three times a day (TID) | ORAL | Status: DC
  Administered 2017-05-31: 1 g via ORAL
  Filled 2017-05-31: qty 10

## 2017-05-31 MED ORDER — ONDANSETRON HCL 4 MG PO TABS: 4.0000 mg | ORAL_TABLET | Freq: Three times a day (TID) | ORAL | 0 refills | Status: DC | PRN

## 2017-05-31 NOTE — Discharge Summary (Addendum)
Physician Discharge Summary  Cynthia Peters:[id number] DOB: January 17, 1951 DOA: 05/28/2017  PCP: Aletha Halim., PA-C  Admit date: 05/28/2017 Discharge date: 05/31/2017  Admitted From: Home Disposition:  Home  Recommendations for Outpatient Follow-up and new medication changes:  1. Follow up with PCP in 1- week 2. Patient was advised about alarm signs including recurrent abdominal pian, nausea or vomiting.  3. Keep soft diet for the next 24 to 48 hours, then advance as tolerated 4. Noted polypharmacy, to review medical regimen as outpatient.   Home Health: no   Equipment/Devices: no    Discharge Condition: stable  CODE STATUS: full  Diet recommendation: heart healthy   Brief/Interim Summary: 67 year old female who presented with abdominal pain.Patient does have the significant past medical history for hypertension,irritable bowel syndrome, dyslipidemia, asthma, vertigo, anxiety/depression and history of4abdominal hernia repairssince 2013.Abdominal pain wasperiumbilical, radiated to the lower quadrants, constant and persistent.On the initial physical examination blood pressure 143/73, heart rate 57, respiratory rate 18, oxygen saturation 100%, temperature 98.4.Dry mucous membranes, lungs clear to auscultation, heart S1-S2 present and rhythmic, the abdomen was soft, distended, decreased bowel sounds, no rigidity, no lower extremity edema. Sodium 138, potassium 4.0, chloride 105, bicarb 23, glucose 119, BUN 27, creatinine 1.08, lipase 33, AST 28, ALT 25, white count 10.9, hemoglobin 14.1, hematocrit 39.4, platelets 221, urine analysis specific gravity less than 1.005. CT of the abdomen with a partial small bowel obstruction with transition zone in the right lower quadrant.  Patient was admitted to the hospitalwith theworking diagnosis of partial small bowel obstruction.   1.  Partial small bowel obstruction.  Patient was admitted to the medical ward, she was kept  nothing by mouth, received IV fluids, IV analgesics, IV antiacids and as needed IV antiemetics.  She responded well to the medical therapy, radiographic surveillance show improvement of obstruction, patient was able to pass gas, her diet was advanced progressively with good toleration.  2.  Hypertension.  Patient initially was placed on IV antihypertensives, her blood pressure remained well controlled, low-dose metoprolol was resumed in the hospital.  At discharge she will continue hydrochlorothiazide, losartan and metoprolol.  3.  Irritable bowel syndrome.  Patient will continue Bentyl.  Clinically with no signs of exacerbation.  4.  Dyslipidemia.  Continue simvastatin.  5.  Asthma.  No signs of exacerbation.  6.  Anxiety.  Continue bupropion and alprazolam.  No confusion or agitation.    Discharge Diagnoses:  Principal Problem:   Partial small bowel obstruction (HCC) Active Problems:   PVC's (premature ventricular contractions)   Hypertension   Anxiety   SBO (small bowel obstruction) ([facility])    Discharge Instructions   Allergies as of 05/31/2017      Reactions   Hydrocodone Rash   Hydrocodone-acetaminophen Rash   Tramadol Palpitations      Medication List    STOP taking these medications   dexamethasone 0.5 MG/5ML solution Commonly known as:  DECADRON   ondansetron 4 MG disintegrating tablet Commonly known as:  ZOFRAN ODT     TAKE these medications   acetaminophen 500 MG tablet Commonly known as:  TYLENOL Take 1,000 mg by mouth every 6 (six) hours as needed (pain).   albuterol 108 (90 Base) MCG/ACT inhaler Commonly known as:  PROVENTIL HFA;VENTOLIN HFA Inhale 2 puffs into the lungs every 6 (six) hours as needed for wheezing or shortness of breath.   ALIGN PO Take 1 capsule by mouth 2 (two) times daily.   ALLEGRA PO Take 1 tablet by mouth  daily as needed (ALLERGIES).   ALPRAZolam 0.25 MG tablet Commonly known as:  XANAX Take I pill every 6 hours as needed  for anxiety   aspirin 81 MG chewable tablet Chew 81 mg by mouth daily.   buPROPion 300 MG 24 hr tablet Commonly known as:  WELLBUTRIN XL Take 300 mg by mouth daily.   clobetasol cream 0.05 % Commonly known as:  TEMOVATE Apply 1 application topically 2 (two) times daily as needed.   Co Q 10 100 MG Caps Take 100 mg by mouth daily.   [employer] VITAMIN C 1000 MG tablet Generic drug:  ascorbic acid Take 1,000 mg by mouth 2 (two) times daily.   cycloSPORINE 0.05 % ophthalmic emulsion Commonly known as:  RESTASIS Place 1 drop into both eyes 2 (two) times daily.   dicyclomine 10 MG capsule Commonly known as:  BENTYL Take 10 mg by mouth every 4 (four) hours as needed (for abdominal cramps).   Evening Primrose Oil 1000 MG Caps Take 1,000 mg by mouth 2 (two) times daily.   Flaxseed (Linseed) 1000 MG Caps Take 1,000 mg by mouth 2 (two) times daily.   fluticasone 50 MCG/ACT nasal spray Commonly known as:  FLONASE Place 2 sprays into both nostrils daily as needed for allergies or rhinitis.   hydrochlorothiazide 25 MG tablet Commonly known as:  HYDRODIURIL Take 25 mg by mouth daily.   HYDROCORTISONE ACE (RECTAL) 30 MG Supp Place 30 mg rectally 2 (two) times daily as needed for hemorrhoids.   ibuprofen 200 MG tablet Commonly known as:  ADVIL,MOTRIN Take 400-600 mg by mouth 2 (two) times daily as needed (pain).   indomethacin 75 MG CR capsule Commonly known as:  INDOCIN SR Take 75 mg by mouth daily. with food   lidocaine 5 % ointment Commonly known as:  XYLOCAINE Apply 1 application topically 3 (three) times daily as needed for mild pain or moderate pain.   losartan 100 MG tablet Commonly known as:  COZAAR TAKE 1 TABLET BY MOUTH EVERY DAY   meclizine 25 MG tablet Commonly known as:  ANTIVERT Take 1 tablet (25 mg total) by mouth 3 (three) times daily as needed for dizziness.   metoprolol succinate 100 MG 24 hr tablet Commonly known as:  TOPROL-XL Take 1 tablet in the  morning and 1/2 tablet (50 mg) in the evening. Take with or immediately following a meal.   montelukast 10 MG tablet Commonly known as:  SINGULAIR Take 10 mg by mouth daily.   multivitamin with minerals Tabs tablet Take 1 tablet by mouth daily.   nystatin 100000 UNIT/ML suspension Commonly known as:  MYCOSTATIN Take 5 mLs by mouth daily as needed (FLARES, LICHEN PLANTIS).   nystatin cream Commonly known as:  MYCOSTATIN Apply 1 application topically 2 (two) times daily as needed for dry skin.   omeprazole 20 MG capsule Commonly known as:  PRILOSEC Take 20 mg by mouth daily.   ondansetron 4 MG tablet Commonly known as:  ZOFRAN Take 1 tablet (4 mg total) by mouth every 8 (eight) hours as needed for up to 10 doses for nausea or vomiting.   PAZEO 0.7 % Soln Generic drug:  Olopatadine HCl Place 1 drop into both eyes daily as needed for allergies.   phenazopyridine 200 MG tablet Commonly known as:  PYRIDIUM Take 200 mg by mouth 3 (three) times daily as needed for pain [place] TRACT PAIN).   potassium chloride SA 20 MEQ tablet Commonly known as:  KLOR-CON M20 Take 1.5  tablets (30 mEq total) by mouth daily.   PREVIDENT 5000 DRY MOUTH 1.1 % Gel dental gel Generic drug:  sodium fluoride USE ONCE DAILY IN PLACE OF REGULAR TOOTHEPASTE   simvastatin 40 MG tablet Commonly known as:  ZOCOR Take 40 mg by mouth daily.   valACYclovir 500 MG tablet Commonly known as:  VALTREX INCREASE TO TAKING 1 TABLET 2 TIMES A DAY FOR 3 DAYS WITH OUTBREAK What changed:  See the new instructions.   Vitamin D 2000 units Caps Take 2,000 Units by mouth daily.       Allergies  Allergen Reactions  . Hydrocodone Rash  . Hydrocodone-Acetaminophen Rash  . Tramadol Palpitations    Consultations:     Procedures/Studies: Dg Abd 1 View  Result Date: 05/30/2017 CLINICAL DATA:  Abdominal distention. EXAM: ABDOMEN - 1 VIEW COMPARISON:  05/29/2017 FINDINGS: Examination demonstrates air and  contrast within the colon due to patient's recent CT scan. The previously seen dilated air-filled central small bowel loops have resolved with only mild prominence of a single air-filled bowel loop in the left mid abdomen measuring 2.9 cm. Remainder of the exam is unchanged. IMPRESSION: Interval resolution of the dilated air-filled small bowel loops in the central abdomen with only minimal residual air-filled prominence of a small bowel loop in the left mid abdomen. Electronically Signed   By: Marin Olp M.D.   On: 05/30/2017 17:39   Dg Abd 1 View  Result Date: 05/29/2017 CLINICAL DATA:  Abdominal distention, pain EXAM: ABDOMEN - 1 VIEW COMPARISON:  CT abdomen pelvis of 05/29/2017 FINDINGS: There are dilated loops of small bowel consistent with partial small bowel obstruction. Very little colonic bowel gas is seen. There is some feces throughout the colon. No opaque calculi are noted. There are degenerative changes in the lower lumbar spine. IMPRESSION: Persistent gaseous distention of small bowel loops consistent with partial small bowel obstruction. Electronically Signed   By: Ivar Drape M.D.   On: 05/29/2017 14:09   Ct Abdomen Pelvis W Contrast  Result Date: 05/29/2017 CLINICAL DATA:  Mid abdominal pain for 5 hours. Abdominal distention. History of irritable bowel syndrome, ventral hernia repair, hiatal hernia, antibiotics for tonsil stone. EXAM: CT ABDOMEN AND PELVIS WITH CONTRAST TECHNIQUE: Multidetector CT imaging of the abdomen and pelvis was performed using the standard protocol following bolus administration of intravenous contrast. CONTRAST:  170mL ISOVUE-300 IOPAMIDOL (ISOVUE-300) INJECTION 61% COMPARISON:  03/12/2016 FINDINGS: Lower chest: Lung bases are clear. Hepatobiliary: Mild diffuse fatty infiltration of the liver. Scattered cysts in the liver, largest in the dome measuring 2.4 cm diameter. No change since previous study. Gallbladder and bile ducts are unremarkable. Pancreas:  Unremarkable. No pancreatic ductal dilatation or surrounding inflammatory changes. Spleen: Normal in size without focal abnormality. Adrenals/Urinary Tract: Adrenal glands are unremarkable. Kidneys are normal, without renal calculi, focal lesion, or hydronephrosis. Bladder is unremarkable. Stomach/Bowel: Gastric wall is not thickened. Proximal small bowel are mildly dilated and fluid-filled with transition zone in the right lower quadrant. Distal small bowel are decompressed. Appearance is consistent with partial obstruction. There appears to be some regional wall thickening at the zone of transition suggesting an inflammatory etiology. No pneumatosis or portal venous gas. Colon is decompressed. Scattered stool in the colon. Appendix is not identified. Vascular/Lymphatic: Aortic atherosclerosis. No enlarged abdominal or pelvic lymph nodes. Reproductive: Uterus and bilateral adnexa are unremarkable. Other: Small amount of free fluid in the abdomen and pelvis with mild mesenteric edema, likely reactive. Small right inguinal hernia containing fat. Scarring in the  anterior abdominal wall consistent with previous postoperative change. Musculoskeletal: No acute or significant osseous findings. 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 IMPRESSION: 1. Changes of partial small bowel obstruction with transition zone in the right lower quadrant. Regional wall thickening in the area of transition suggest inflammatory etiology. Small amount of free fluid in the abdomen and pelvis with mesenteric edema likely reactive. 2. Diffuse fatty infiltration of the liver. 3. Benign-appearing hepatic cysts. Electronically Signed   By: Lucienne Capers M.D.   On: 05/29/2017 00:49       Subjective: Patient is feeling better, no nausea or vomiting, positive bowel movement. No chest pain or dyspnea.   Discharge Exam: Vitals:   05/30/17 2119 05/31/17 0508  BP: (!) 141/62 (!) 135/58  Pulse: (!) 55 (!) 48  Resp: 18 16   Temp: 98.2 F (36.8 C) 97.9 F (36.6 C)  SpO2: 100% 100%   Vitals:   05/30/17 0616 05/30/17 1306 05/30/17 2119 05/31/17 0508  BP: (!) 115/51 (!) 132/56 (!) 141/62 (!) 135/58  Pulse: (!) 58 (!) 55 (!) 55 (!) 48  Resp: 16  18 16   Temp: 98.7 F (37.1 C) 98 F (36.7 C) 98.2 F (36.8 C) 97.9 F (36.6 C)  TempSrc: Oral Oral Oral Oral  SpO2: 98% 99% 100% 100%  Weight:      Height:        General: Not in pain or dyspnea Neurology: Awake and alert, non focal  E ENT: no pallor, no icterus, oral mucosa moist Cardiovascular: No JVD. S1-S2 present, rhythmic, no gallops, rubs, or murmurs. No lower extremity edema. Pulmonary: vesicular breath sounds bilaterally, adequate air movement, no wheezing, rhonchi or rales. Gastrointestinal. Abdomen protuberatn, no organomegaly, non tender, no rebound or guarding Skin. No rashes Musculoskeletal: no joint deformities   The results of significant diagnostics from this hospitalization (including imaging, microbiology, ancillary and laboratory) are listed below for reference.     Microbiology: No results found for this or any previous visit (from the past 240 hour(s)).   Labs: BNP (last 3 results) No results for input(s): BNP in the last 8760 hours. Basic Metabolic Panel: Recent Labs  Lab 05/28/17 2226 05/29/17 0648 05/30/17 0345 05/31/17 0401  NA 138 138 144 142  K 4.0 3.9 3.6 3.9  CL 105 101 108 107  CO2 23 23 28 27   GLUCOSE 119* 170* 120* 94  BUN 27* 19 12 11   CREATININE 1.08* 0.87 0.85 0.92  CALCIUM 9.5 9.7 9.0 8.8*   Liver Function Tests: Recent Labs  Lab 05/28/17 2226  AST 28  ALT 25  ALKPHOS 55  BILITOT 1.2  PROT 6.8  ALBUMIN 4.6   Recent Labs  Lab 05/28/17 2226  LIPASE 33   No results for input(s): AMMONIA in the last 168 hours. CBC: Recent Labs  Lab 05/28/17 2226 05/29/17 0648 05/30/17 0345 05/31/17 0401  WBC 10.9* 13.5* 10.1 7.7  NEUTROABS 6.9  --  6.2 3.9  HGB 14.1 14.8 12.6 12.2  HCT 39.4 42.9 37.3  35.6*  MCV 95.6 96.4 97.6 97.5  PLT 221 235 196 186   Cardiac Enzymes: No results for input(s): CKTOTAL, CKMB, CKMBINDEX, TROPONINI in the last 168 hours. BNP: Invalid input(s): POCBNP CBG: Recent Labs  Lab 05/30/17 1127  GLUCAP 94   D-Dimer No results for input(s): DDIMER in the last 72 hours. Hgb A1c No results for input(s): HGBA1C in the last 72 hours. Lipid Profile No results for input(s): CHOL, HDL, LDLCALC, TRIG, CHOLHDL, LDLDIRECT in the last  72 hours. Thyroid function studies No results for input(s): TSH, T4TOTAL, T3FREE, THYROIDAB in the last 72 hours.  Invalid input(s): FREET3 Anemia work up No results for input(s): VITAMINB12, FOLATE, FERRITIN, TIBC, IRON, RETICCTPCT in the last 72 hours. Urinalysis    Component Value Date/Time   COLORURINE YELLOW 05/28/2017 2216   APPEARANCEUR CLEAR 05/28/2017 2216   LABSPEC <1.005 (L) 05/28/2017 2216   PHURINE 6.5 05/28/2017 2216   GLUCOSEU NEGATIVE 05/28/2017 2216   HGBUR NEGATIVE 05/28/2017 2216   BILIRUBINUR NEGATIVE 05/28/2017 2216   BILIRUBINUR n 10/20/2014 0949   KETONESUR NEGATIVE 05/28/2017 2216   PROTEINUR NEGATIVE 05/28/2017 2216   UROBILINOGEN negative 10/20/2014 0949   UROBILINOGEN 0.2 09/09/2014 0942   NITRITE NEGATIVE 05/28/2017 2216   LEUKOCYTESUR NEGATIVE 05/28/2017 2216   Sepsis Labs Invalid input(s): PROCALCITONIN,  WBC,  LACTICIDVEN Microbiology No results found for this or any previous visit (from the past 240 hour(s)).   Time coordinating discharge: 45 minutes  SIGNED:   Tawni Millers, MD  [facility] 05/31/2017, 9:35 AM Pager [phone]  If 7PM-7AM, please contact night-coverage [email or web address] Password [email or web address]

## 2017-05-31 NOTE — Progress Notes (Signed)
Patient discharged to home with family, discahrge instructions reviewed with patient who verbalized understanding. RX's sent to pharmacy.

## 2017-06-24 DIAGNOSIS — D104 Benign neoplasm of tonsil: Secondary | ICD-10-CM | POA: Insufficient documentation

## 2017-07-08 DIAGNOSIS — M25521 Pain in right elbow: Secondary | ICD-10-CM | POA: Insufficient documentation

## 2017-07-15 ENCOUNTER — Other Ambulatory Visit: Payer: Self-pay | Admitting: Family Medicine

## 2017-07-15 DIAGNOSIS — Z1231 Encounter for screening mammogram for malignant neoplasm of breast: Secondary | ICD-10-CM

## 2017-07-16 ENCOUNTER — Observation Stay (HOSPITAL_COMMUNITY): Payer: Medicare Other

## 2017-07-16 ENCOUNTER — Observation Stay (HOSPITAL_BASED_OUTPATIENT_CLINIC_OR_DEPARTMENT_OTHER)
Admission: EM | Admit: 2017-07-16 | Discharge: 2017-07-17 | Disposition: A | Discharge status: Home / Self Care | Payer: Medicare Other | Attending: Surgery | Admitting: Surgery

## 2017-07-16 ENCOUNTER — Other Ambulatory Visit: Payer: Self-pay

## 2017-07-16 ENCOUNTER — Emergency Department (HOSPITAL_COMMUNITY): Payer: Medicare Other

## 2017-07-16 ENCOUNTER — Encounter (HOSPITAL_BASED_OUTPATIENT_CLINIC_OR_DEPARTMENT_OTHER): Payer: Self-pay | Admitting: Emergency Medicine

## 2017-07-16 ENCOUNTER — Emergency Department (HOSPITAL_BASED_OUTPATIENT_CLINIC_OR_DEPARTMENT_OTHER): Payer: Medicare Other

## 2017-07-16 DIAGNOSIS — K589 Irritable bowel syndrome without diarrhea: Secondary | ICD-10-CM | POA: Insufficient documentation

## 2017-07-16 DIAGNOSIS — Z86718 Personal history of other venous thrombosis and embolism: Secondary | ICD-10-CM | POA: Diagnosis not present

## 2017-07-16 DIAGNOSIS — Z4659 Encounter for fitting and adjustment of other gastrointestinal appliance and device: Secondary | ICD-10-CM | POA: Insufficient documentation

## 2017-07-16 DIAGNOSIS — Z885 Allergy status to narcotic agent status: Secondary | ICD-10-CM | POA: Diagnosis not present

## 2017-07-16 DIAGNOSIS — Z87891 Personal history of nicotine dependence: Secondary | ICD-10-CM | POA: Diagnosis not present

## 2017-07-16 DIAGNOSIS — Z79899 Other long term (current) drug therapy: Secondary | ICD-10-CM | POA: Insufficient documentation

## 2017-07-16 DIAGNOSIS — J45909 Unspecified asthma, uncomplicated: Secondary | ICD-10-CM | POA: Diagnosis not present

## 2017-07-16 DIAGNOSIS — Z0189 Encounter for other specified special examinations: Secondary | ICD-10-CM

## 2017-07-16 DIAGNOSIS — K219 Gastro-esophageal reflux disease without esophagitis: Secondary | ICD-10-CM

## 2017-07-16 DIAGNOSIS — F329 Major depressive disorder, single episode, unspecified: Secondary | ICD-10-CM | POA: Insufficient documentation

## 2017-07-16 DIAGNOSIS — K449 Diaphragmatic hernia without obstruction or gangrene: Secondary | ICD-10-CM | POA: Diagnosis not present

## 2017-07-16 DIAGNOSIS — N3010 Interstitial cystitis (chronic) without hematuria: Secondary | ICD-10-CM | POA: Diagnosis not present

## 2017-07-16 DIAGNOSIS — K7689 Other specified diseases of liver: Secondary | ICD-10-CM | POA: Diagnosis not present

## 2017-07-16 DIAGNOSIS — I700 Atherosclerosis of aorta: Secondary | ICD-10-CM | POA: Diagnosis not present

## 2017-07-16 DIAGNOSIS — Z7951 Long term (current) use of inhaled steroids: Secondary | ICD-10-CM | POA: Insufficient documentation

## 2017-07-16 DIAGNOSIS — E782 Mixed hyperlipidemia: Secondary | ICD-10-CM | POA: Diagnosis present

## 2017-07-16 DIAGNOSIS — G4700 Insomnia, unspecified: Secondary | ICD-10-CM | POA: Diagnosis not present

## 2017-07-16 DIAGNOSIS — I10 Essential (primary) hypertension: Secondary | ICD-10-CM | POA: Insufficient documentation

## 2017-07-16 DIAGNOSIS — E785 Hyperlipidemia, unspecified: Secondary | ICD-10-CM | POA: Insufficient documentation

## 2017-07-16 DIAGNOSIS — F419 Anxiety disorder, unspecified: Secondary | ICD-10-CM | POA: Insufficient documentation

## 2017-07-16 DIAGNOSIS — Z888 Allergy status to other drugs, medicaments and biological substances status: Secondary | ICD-10-CM | POA: Insufficient documentation

## 2017-07-16 DIAGNOSIS — K56600 Partial intestinal obstruction, unspecified as to cause: Secondary | ICD-10-CM

## 2017-07-16 DIAGNOSIS — I493 Ventricular premature depolarization: Secondary | ICD-10-CM | POA: Insufficient documentation

## 2017-07-16 DIAGNOSIS — K56609 Unspecified intestinal obstruction, unspecified as to partial versus complete obstruction: Secondary | ICD-10-CM | POA: Diagnosis present

## 2017-07-16 DIAGNOSIS — Z8614 Personal history of Methicillin resistant Staphylococcus aureus infection: Secondary | ICD-10-CM | POA: Insufficient documentation

## 2017-07-16 DIAGNOSIS — Z7982 Long term (current) use of aspirin: Secondary | ICD-10-CM | POA: Insufficient documentation

## 2017-07-16 DIAGNOSIS — Z8673 Personal history of transient ischemic attack (TIA), and cerebral infarction without residual deficits: Secondary | ICD-10-CM

## 2017-07-16 DIAGNOSIS — K432 Incisional hernia without obstruction or gangrene: Secondary | ICD-10-CM | POA: Diagnosis present

## 2017-07-16 HISTORY — DX: Transient cerebral ischemic attack, unspecified: G45.9

## 2017-07-16 HISTORY — DX: Partial intestinal obstruction, unspecified as to cause: K56.600

## 2017-07-16 LAB — HEPATIC FUNCTION PANEL
ALT: 31 U/L (ref 14–54)
AST: 32 U/L (ref 15–41)
Albumin: 4.3 g/dL (ref 3.5–5.0)
Alkaline Phosphatase: 60 U/L (ref 38–126)
Bilirubin, Direct: 0.2 mg/dL (ref 0.1–0.5)
Indirect Bilirubin: 1 mg/dL — ABNORMAL HIGH (ref 0.3–0.9)
Total Bilirubin: 1.2 mg/dL (ref 0.3–1.2)
Total Protein: 6.8 g/dL (ref 6.5–8.1)

## 2017-07-16 LAB — CBC WITH DIFFERENTIAL/PLATELET
Basophils Absolute: 0 10*3/uL (ref 0.0–0.1)
Basophils Relative: 0 %
Eosinophils Absolute: 0.6 10*3/uL (ref 0.0–0.7)
Eosinophils Relative: 5 %
HCT: 40.7 % (ref 36.0–46.0)
Hemoglobin: 14.3 g/dL (ref 12.0–15.0)
Lymphocytes Relative: 18 %
Lymphs Abs: 1.9 10*3/uL (ref 0.7–4.0)
MCH: 33.9 pg (ref 26.0–34.0)
MCHC: 35.1 g/dL (ref 30.0–36.0)
MCV: 96.4 fL (ref 78.0–100.0)
Monocytes Absolute: 1 10*3/uL (ref 0.1–1.0)
Monocytes Relative: 9 %
Neutro Abs: 7.1 10*3/uL (ref 1.7–7.7)
Neutrophils Relative %: 68 %
Platelets: 184 10*3/uL (ref 150–400)
RBC: 4.22 MIL/uL (ref 3.87–5.11)
RDW: 12.7 % (ref 11.5–15.5)
WBC: 10.6 10*3/uL — ABNORMAL HIGH (ref 4.0–10.5)

## 2017-07-16 LAB — URINALYSIS, MICROSCOPIC (REFLEX)

## 2017-07-16 LAB — BASIC METABOLIC PANEL
Anion gap: 9 (ref 5–15)
BUN: 21 mg/dL — ABNORMAL HIGH (ref 6–20)
CO2: 30 mmol/L (ref 22–32)
Calcium: 9.4 mg/dL (ref 8.9–10.3)
Chloride: 101 mmol/L (ref 101–111)
Creatinine, Ser: 0.92 mg/dL (ref 0.44–1.00)
GFR calc Af Amer: >60 mL/min (ref >60)
GFR calc non Af Amer: >60 mL/min (ref >60)
Glucose, Bld: 118 mg/dL — ABNORMAL HIGH (ref 65–99)
Potassium: 4 mmol/L (ref 3.5–5.1)
Sodium: 140 mmol/L (ref 135–145)

## 2017-07-16 LAB — URINALYSIS, ROUTINE W REFLEX MICROSCOPIC
Bilirubin Urine: NEGATIVE
Glucose, UA: NEGATIVE mg/dL
Hgb urine dipstick: NEGATIVE
Ketones, ur: NEGATIVE mg/dL
Nitrite: NEGATIVE
Protein, ur: NEGATIVE mg/dL
Specific Gravity, Urine: 1.01 (ref 1.005–1.030)
pH: 7 (ref 5.0–8.0)

## 2017-07-16 LAB — PHOSPHORUS: Phosphorus: 3.8 mg/dL (ref 2.5–4.6)

## 2017-07-16 LAB — MAGNESIUM: Magnesium: 2.1 mg/dL (ref 1.7–2.4)

## 2017-07-16 LAB — PREALBUMIN: Prealbumin: 27.7 mg/dL (ref 18–38)

## 2017-07-16 MED ORDER — ALUM & MAG HYDROXIDE-SIMETH 200-200-20 MG/5ML PO SUSP: 30.0000 mL | Freq: Four times a day (QID) | ORAL | Status: DC | PRN

## 2017-07-16 MED ORDER — FENTANYL CITRATE (PF) 100 MCG/2ML IJ SOLN: 25.0000 ug | INTRAMUSCULAR | Status: DC | PRN

## 2017-07-16 MED ORDER — DIPHENHYDRAMINE HCL 50 MG/ML IJ SOLN: 12.5000 mg | Freq: Four times a day (QID) | INTRAMUSCULAR | Status: DC | PRN

## 2017-07-16 MED ORDER — MAGIC MOUTHWASH
15.0000 mL | Freq: Four times a day (QID) | ORAL | Status: DC | PRN
  Filled 2017-07-16: qty 15

## 2017-07-16 MED ORDER — LACTATED RINGERS IV SOLN
INTRAVENOUS | Status: DC
  Administered 2017-07-16: 75 mL/h via INTRAVENOUS

## 2017-07-16 MED ORDER — ONDANSETRON HCL 4 MG/2ML IJ SOLN
4.0000 mg | Freq: Once | INTRAMUSCULAR | Status: AC
  Administered 2017-07-16: 4 mg via INTRAVENOUS
  Filled 2017-07-16: qty 2

## 2017-07-16 MED ORDER — SODIUM CHLORIDE 0.9 % IV SOLN
8.0000 mg | Freq: Four times a day (QID) | INTRAVENOUS | Status: DC | PRN
  Filled 2017-07-16: qty 4

## 2017-07-16 MED ORDER — ONDANSETRON HCL 4 MG/2ML IJ SOLN
4.0000 mg | Freq: Four times a day (QID) | INTRAMUSCULAR | Status: DC | PRN
  Administered 2017-07-17: 4 mg via INTRAVENOUS
  Filled 2017-07-16 (×2): qty 2

## 2017-07-16 MED ORDER — FENTANYL CITRATE (PF) 100 MCG/2ML IJ SOLN
50.0000 ug | Freq: Once | INTRAMUSCULAR | Status: AC
  Administered 2017-07-16: 50 ug via INTRAVENOUS
  Filled 2017-07-16: qty 2

## 2017-07-16 MED ORDER — FAMOTIDINE IN NACL 20-0.9 MG/50ML-% IV SOLN
20.0000 mg | Freq: Two times a day (BID) | INTRAVENOUS | Status: DC
  Administered 2017-07-16 – 2017-07-17 (×2): 20 mg via INTRAVENOUS
  Filled 2017-07-16 (×2): qty 50

## 2017-07-16 MED ORDER — DIATRIZOATE MEGLUMINE & SODIUM 66-10 % PO SOLN
90.0000 mL | Freq: Once | ORAL | Status: DC
  Filled 2017-07-16: qty 90

## 2017-07-16 MED ORDER — HYDRALAZINE HCL 20 MG/ML IJ SOLN: 10.0000 mg | INTRAMUSCULAR | Status: DC | PRN

## 2017-07-16 MED ORDER — ACETAMINOPHEN 650 MG RE SUPP: 650.0000 mg | Freq: Four times a day (QID) | RECTAL | Status: DC | PRN

## 2017-07-16 MED ORDER — ONDANSETRON HCL 4 MG/2ML IJ SOLN: 4.0000 mg | Freq: Four times a day (QID) | INTRAMUSCULAR | Status: DC | PRN

## 2017-07-16 MED ORDER — LACTATED RINGERS IV BOLUS
1000.0000 mL | Freq: Once | INTRAVENOUS | Status: AC
  Administered 2017-07-16: 1000 mL via INTRAVENOUS

## 2017-07-16 MED ORDER — METOPROLOL TARTRATE 5 MG/5ML IV SOLN: 5.0000 mg | Freq: Four times a day (QID) | INTRAVENOUS | Status: DC | PRN

## 2017-07-16 MED ORDER — DIATRIZOATE MEGLUMINE & SODIUM 66-10 % PO SOLN
90.0000 mL | Freq: Once | ORAL | Status: AC
  Administered 2017-07-16: 90 mL via NASOGASTRIC
  Filled 2017-07-16: qty 90

## 2017-07-16 MED ORDER — MAGIC MOUTHWASH
15.0000 mL | Freq: Four times a day (QID) | ORAL | Status: DC | PRN
  Administered 2017-07-16: 15 mL via ORAL
  Filled 2017-07-16: qty 15

## 2017-07-16 MED ORDER — FLUTICASONE PROPIONATE 50 MCG/ACT NA SUSP: 1.0000 | Freq: Every day | NASAL | Status: DC | PRN

## 2017-07-16 MED ORDER — LACTATED RINGERS IV BOLUS: 1000.0000 mL | Freq: Three times a day (TID) | INTRAVENOUS | Status: DC | PRN

## 2017-07-16 MED ORDER — PHENOL 1.4 % MT LIQD
1.0000 | OROMUCOSAL | Status: DC | PRN
  Filled 2017-07-16 (×2): qty 177

## 2017-07-16 MED ORDER — METOPROLOL TARTRATE 5 MG/5ML IV SOLN
5.0000 mg | Freq: Four times a day (QID) | INTRAVENOUS | Status: DC
  Administered 2017-07-16 – 2017-07-17 (×3): 5 mg via INTRAVENOUS
  Filled 2017-07-16 (×3): qty 5

## 2017-07-16 MED ORDER — ENALAPRILAT 1.25 MG/ML IV SOLN
0.6250 mg | Freq: Four times a day (QID) | INTRAVENOUS | Status: DC | PRN
  Filled 2017-07-16: qty 1

## 2017-07-16 MED ORDER — PHENOL 1.4 % MT LIQD: 2.0000 | OROMUCOSAL | Status: DC | PRN

## 2017-07-16 MED ORDER — FENTANYL CITRATE (PF) 100 MCG/2ML IJ SOLN
25.0000 ug | INTRAMUSCULAR | Status: DC | PRN
  Administered 2017-07-16: 50 ug via INTRAVENOUS
  Filled 2017-07-16: qty 2

## 2017-07-16 MED ORDER — MENTHOL 3 MG MT LOZG
1.0000 | LOZENGE | OROMUCOSAL | Status: DC | PRN
  Filled 2017-07-16: qty 9

## 2017-07-16 MED ORDER — PROCHLORPERAZINE EDISYLATE 10 MG/2ML IJ SOLN: 5.0000 mg | INTRAMUSCULAR | Status: DC | PRN

## 2017-07-16 MED ORDER — HYDROCORTISONE 1 % EX CREA: 1.0000 "application " | TOPICAL_CREAM | Freq: Three times a day (TID) | CUTANEOUS | Status: DC | PRN

## 2017-07-16 MED ORDER — METOCLOPRAMIDE HCL 5 MG/ML IJ SOLN: 10.0000 mg | Freq: Four times a day (QID) | INTRAMUSCULAR | Status: DC | PRN

## 2017-07-16 MED ORDER — SODIUM CHLORIDE 0.9 % IV SOLN
INTRAVENOUS | Status: DC
  Administered 2017-07-16: 04:00:00 via INTRAVENOUS

## 2017-07-16 MED ORDER — ALBUTEROL SULFATE (2.5 MG/3ML) 0.083% IN NEBU: 2.5000 mg | INHALATION_SOLUTION | Freq: Four times a day (QID) | RESPIRATORY_TRACT | Status: DC | PRN

## 2017-07-16 MED ORDER — LIP MEDEX EX OINT
1.0000 "application " | TOPICAL_OINTMENT | Freq: Two times a day (BID) | CUTANEOUS | Status: DC
  Administered 2017-07-16 – 2017-07-17 (×3): 1 via TOPICAL
  Filled 2017-07-16: qty 7

## 2017-07-16 MED ORDER — FENTANYL CITRATE (PF) 100 MCG/2ML IJ SOLN
100.0000 ug | Freq: Once | INTRAMUSCULAR | Status: AC
  Administered 2017-07-16: 100 ug via INTRAVENOUS
  Filled 2017-07-16: qty 2

## 2017-07-16 MED ORDER — HYDROCORTISONE 2.5 % RE CREA: 1.0000 "application " | TOPICAL_CREAM | Freq: Four times a day (QID) | RECTAL | Status: DC | PRN

## 2017-07-16 MED ORDER — DEXTROSE 5 % IV SOLN
1000.0000 mg | Freq: Four times a day (QID) | INTRAVENOUS | Status: DC | PRN
  Filled 2017-07-16: qty 10

## 2017-07-16 MED ORDER — BISACODYL 10 MG RE SUPP: 10.0000 mg | Freq: Every day | RECTAL | Status: DC

## 2017-07-16 MED ORDER — ENOXAPARIN SODIUM 40 MG/0.4ML ~~LOC~~ SOLN
40.0000 mg | SUBCUTANEOUS | Status: DC
  Administered 2017-07-16: 40 mg via SUBCUTANEOUS
  Filled 2017-07-16: qty 0.4

## 2017-07-16 MED ORDER — KCL IN DEXTROSE-NACL 20-5-0.45 MEQ/L-%-% IV SOLN
INTRAVENOUS | Status: DC
  Administered 2017-07-16 – 2017-07-17 (×2): via INTRAVENOUS
  Filled 2017-07-16 (×3): qty 1000

## 2017-07-16 MED ORDER — IOPAMIDOL (ISOVUE-300) INJECTION 61%
100.0000 mL | Freq: Once | INTRAVENOUS | Status: AC | PRN
  Administered 2017-07-16: 100 mL via INTRAVENOUS

## 2017-07-16 MED ORDER — GUAIFENESIN-DM 100-10 MG/5ML PO SYRP: 10.0000 mL | ORAL_SOLUTION | ORAL | Status: DC | PRN

## 2017-07-16 NOTE — ED Notes (Signed)
[facility] called and reported that the all radiology orders would have to be cancelled and reordered due to orders not crossing over from [place]. A call placed to Dr. Johney Maine by ED secretary.

## 2017-07-16 NOTE — ED Notes (Signed)
Patient refusing NGT at this time; EDP at bedside to discuss with patient.

## 2017-07-16 NOTE — ED Notes (Signed)
Patient transported to CT 

## 2017-07-16 NOTE — ED Notes (Signed)
Patient reports that she is passing gas.

## 2017-07-16 NOTE — ED Notes (Signed)
ED TO INPATIENT HANDOFF REPORT  Name/Age/Gender Cynthia Peters 67 y.o. female  Code Status    Code Status Orders  (From admission, onward)        Start     Ordered   07/16/17 1052  Full code  Continuous     07/16/17 1056    Code Status History    Date Active Date Inactive Code Status Order ID Comments User Context   05/29/2017 0429 05/31/2017 2150 Full Code [id number]  Rise Patience, MD Inpatient   06/17/2016 2123 06/18/2016 2333 Full Code [id number]  Abran Duke Inpatient   03/26/2015 1658 03/30/2015 1650 Full Code [id number]  Ralene Ok, MD Inpatient   10/25/2014 1409 10/30/2014 1743 Full Code [id number]  Ralene Ok, MD Inpatient      Home/SNF/Other Home  Chief Complaint abdominal pain  Level of Care/Admitting Diagnosis ED Disposition    ED Disposition Condition [facility] [place] Area: [facility] [[id number]]  Level of Care: Med-Surg [16]  Diagnosis: Partial small bowel obstruction [facility]) [[id number]]  Admitting Physician: CCS, MD [[id number]]  Attending Physician: CCS, MD [[id number]]  Bed request comments: 5W  PT Class (Do Not Modify): Observation [104]  PT Acc Code (Do Not Modify): Observation [10022]       Medical History Past Medical History:  Diagnosis Date  . Anemia    history in the past, not on a regular basis  . Arthritis   . Bronchitis   . Cataracts, bilateral    immature  . Claustrophobia    takes Valium daily as needed  . Complication of anesthesia    2013 SURGERY vocal cord bothering pt, used smaller ent tube after 2 hernia repair, no problem after . " I woke up during my MRI and colonoscopy."       . Depression    takes Wellbutrin daily  . Dry eye   . Dry mouth   . GERD (gastroesophageal reflux disease)    takes Omeprazole daily   . H/O hiatal hernia   . Headache    migraines with flashing lights   . History of blood clots 40 yrs    leg   . History of echocardiogram 2013   a. Echo (05/2011): Mild  LVH, EF 65-70%, no WMA, Gr 1 DD, mild AI.  Marland Kitchen History of MRSA infection   . History of staph infection   . History of stress test 2010   a. ETT-Echo in 2010 was normal  . History of TIA (transient ischemic attack) 2013   a. Carotid US (05/2011): No ICA stenosis, pt unsure of this  . Hyperlipidemia    takes Simvastatin daily   . Hypertension    takes  Losartan daily  . IBS (irritable bowel syndrome)    takes Bentyl daily as needed  . Insomnia    takes Ambien nightly  . Interstitial cystitis   . Lichen planus   . Mild asthma    Albuterol inhaler as needed  . Palpitations    Event monitor in 2007 with rare PVCs // takes Metoprolol daily  . PVC (premature ventricular contraction)   . Seasonal allergies    takes Claritin daily as needed.Takes Singulair daily as needed.  Marland Kitchen TIA (transient ischemic attack) 05/27/2011  . Urethral stricture   . Vertigo    takes Meclizine daily as needed    Allergies Allergies  Allergen Reactions  . Hydrocodone Rash  . Hydrocodone-Acetaminophen Rash  . Tramadol Palpitations    IV Location/Drains/Wounds  Patient Lines/Drains/Airways Status   Active Line/Drains/Airways    Name:   Placement date:   Placement time:   Site:   Days:   Peripheral IV 07/16/17 Left;Anterior Forearm   07/16/17    0405    Forearm   less than 1   NG/OG Tube Nasogastric 16 Fr. Left nare Aucultation Measured external length of tube   07/16/17    0547    Left nare   less than 1          Labs/Imaging Results for orders placed or performed during the hospital encounter of 07/16/17 (from the past 48 hour(s))  Urinalysis, Routine w reflex microscopic     Status: Abnormal   Collection Time: 07/16/17  3:44 AM  Result Value Ref Range   Color, Urine YELLOW YELLOW   APPearance CLEAR CLEAR   Specific Gravity, Urine 1.010 1.005 - 1.030   pH 7.0 5.0 - 8.0   Glucose, UA NEGATIVE NEGATIVE mg/dL   Hgb urine dipstick NEGATIVE NEGATIVE   Bilirubin Urine NEGATIVE NEGATIVE   Ketones, ur  NEGATIVE NEGATIVE mg/dL   Protein, ur NEGATIVE NEGATIVE mg/dL   Nitrite NEGATIVE NEGATIVE   Leukocytes, UA MODERATE (A) NEGATIVE    Comment: Performed at [facility], [address] [place], [place] [address]  Urinalysis, Microscopic (reflex)     Status: Abnormal   Collection Time: 07/16/17  3:44 AM  Result Value Ref Range   RBC / HPF 0-5 0 - 5 RBC/hpf   WBC, UA 6-10 0 - 5 WBC/hpf   Bacteria, UA FEW (A) NONE SEEN   Squamous Epithelial / LPF 0-5 0 - 5    Comment: Performed at [facility], [address] [place], [place] [address]  CBC with Differential/Platelet     Status: Abnormal   Collection Time: 07/16/17  3:49 AM  Result Value Ref Range   WBC 10.6 (H) 4.0 - 10.5 K/uL   RBC 4.22 3.87 - 5.11 MIL/uL   Hemoglobin 14.3 12.0 - 15.0 g/dL   HCT 40.7 36.0 - 46.0 %   MCV 96.4 78.0 - 100.0 fL   MCH 33.9 26.0 - 34.0 pg   MCHC 35.1 30.0 - 36.0 g/dL   RDW 12.7 11.5 - 15.5 %   Platelets 184 150 - 400 K/uL   Neutrophils Relative % 68 %   Neutro Abs 7.1 1.7 - 7.7 K/uL   Lymphocytes Relative 18 %   Lymphs Abs 1.9 0.7 - 4.0 K/uL   Monocytes Relative 9 %   Monocytes Absolute 1.0 0.1 - 1.0 K/uL   Eosinophils Relative 5 %   Eosinophils Absolute 0.6 0.0 - 0.7 K/uL   Basophils Relative 0 %   Basophils Absolute 0.0 0.0 - 0.1 K/uL    Comment: Performed at [facility], [address] [place], [place] [address]  Basic metabolic panel     Status: Abnormal   Collection Time: 07/16/17  3:49 AM  Result Value Ref Range   Sodium 140 135 - 145 mmol/L   Potassium 4.0 3.5 - 5.1 mmol/L   Chloride 101 101 - 111 mmol/L   CO2 30 22 - 32 mmol/L   Glucose, Bld 118 (H) 65 - 99 mg/dL   BUN 21 (H) 6 - 20 mg/dL   Creatinine, Ser 0.92 0.44 - 1.00 mg/dL   Calcium 9.4 8.9 - 10.3 mg/dL   GFR calc non Af Amer >60 >60 mL/min   GFR calc Af Amer >60 >  60 mL/min    Comment: (NOTE) The eGFR has been calculated using the CKD EPI equation. This calculation has not been validated in  all clinical situations. eGFR's persistently <60 mL/min signify possible Chronic Kidney Disease.    Anion gap 9 5 - 15    Comment: Performed at [facility], [address] [place], [place] [address]   Ct Abdomen Pelvis W Contrast  Result Date: 07/16/2017 CLINICAL DATA:  Abdominal pain EXAM: CT ABDOMEN AND PELVIS WITH CONTRAST TECHNIQUE: Multidetector CT imaging of the abdomen and pelvis was performed using the standard protocol following bolus administration of intravenous contrast. CONTRAST:  176m ISOVUE-300 IOPAMIDOL (ISOVUE-300) INJECTION 61% COMPARISON:  None. FINDINGS: LOWER CHEST: No basilar pulmonary nodules or pleural effusion. No apical pericardial effusion. HEPATOBILIARY: Unchanged 2.5 cm hepatic cyst. Smaller hypodense foci are also unchanged. Normal gallbladder. PANCREAS: Normal parenchymal contours without ductal dilatation. No peripancreatic fluid collection. SPLEEN: Normal. ADRENALS/URINARY TRACT: --Adrenal glands: Normal. --Right kidney/ureter: No hydronephrosis, nephroureterolithiasis, perinephric stranding or solid renal mass. --Left kidney/ureter: No hydronephrosis, nephroureterolithiasis, perinephric stranding or solid renal mass. --Urinary bladder: Normal for degree of distention STOMACH/BOWEL: --Stomach/Duodenum: No hiatal hernia or other gastric abnormality. Normal duodenal course. --Small bowel: Multiple loops of prominent, but not pathologically dilated, small bowel in the central low mid abdomen with decompression of the distal ileum. Caliber transition occurs in the right lower quadrant. There is mild stranding surrounding a loop in the right lower quadrant (coronal image 39, axial image 62). --Colon: No focal abnormality. --Appendix: Normal. VASCULAR/LYMPHATIC: Atherosclerotic calcification is present within the non-aneurysmal abdominal aorta, without hemodynamically significant stenosis. The portal vein, splenic vein, superior mesenteric vein and IVC are patent. No  abdominal or pelvic lymphadenopathy. REPRODUCTIVE: Normal uterus and ovaries. MUSCULOSKELETAL. Grade 1 anterolisthesis at L4-L5 secondary to facet hypertrophy. OTHER: None. IMPRESSION: 1. Prominent, borderline dilated small bowel in the central, low mid abdomen with decompressed distal ileum. Mild inflammatory stranding surrounding a loop of small bowel in the right lower quadrant. This may indicate partial obstruction or adynamic ileus secondary to active inflammation. The appearance is very similar to the 05/29/2017 study, but the location of the inflammatory changes different. 2.  Aortic Atherosclerosis (ICD10-I70.0). 3. Grade 1 L4-5 anterolisthesis secondary to facet arthrosis. Electronically Signed   By: KUlyses JarredM.D.   On: 07/16/2017 04:46   Dg Abd Portable 1v-small Bowel Protocol-position Verification  Result Date: 07/16/2017 CLINICAL DATA:  67year old female NG tube placement. EXAM: PORTABLE ABDOMEN - 1 VIEW COMPARISON:  CT Abdomen and Pelvis 0423 hours today. FINDINGS: Portable AP upright view at 0656 hours. An enteric tube courses into the proximal stomach from the lower mediastinum. The side hole is at the level of the distal thoracic esophagus (arrow). Negative left lung base. Elevated right hemidiaphragm again noted. IV contrast is being excreted from both kidneys, proximal ureters appear within normal limits. Stable bowel gas pattern. No osseous abnormality identified. IMPRESSION: Enteric tube coursing into the proximal stomach, side hole still the distal esophagus. Advance the tube 6 centimeters to ensure side hole placement within the stomach. Electronically Signed   By: HGenevie AnnM.D.   On: 07/16/2017 08:01    Pending Labs Unresulted Labs (From admission, onward)   Start     Ordered   07/23/17 0500  Creatinine, serum  (enoxaparin (LOVENOX)    CrCl >/= 30 ml/min)  Weekly,   R    Comments:  while on enoxaparin therapy    07/16/17 1056   07/17/17 0600  Basic  metabolic panel  Once,   R      07/16/17 1056   07/16/17 0655  Magnesium  Add-on,   R    Comments:  If Mag level is <2, give 3g IV Magnesium sulfate x 1   Question:  Specimen collection method  Answer:  IV Team   07/16/17 0654   07/16/17 0655  Prealbumin  Add-on,   R    Question:  Specimen collection method  Answer:  IV Team   07/16/17 0654   07/16/17 0655  Phosphorus  Add-on,   R    Comments:  If Phos >2.5, do nothingIf Phos 1.7 -2.5, give 51mol NaPhos IV daily x 2 daysIf Phos <1.7, give 269ml NaPhos IV BID x 2 days   Question:  Specimen collection method  Answer:  IV Team   07/16/17 0654   07/16/17 0655  Hepatic function panel  Add-on,   R    Question:  Specimen collection method  Answer:  IV Team   07/16/17 0654      Vitals/Pain Today's Vitals   07/16/17 0900 07/16/17 0930 07/16/17 1000 07/16/17 1112  BP: 128/73 (!) 147/67 (!) 156/80 (!) 148/74  Pulse: 61 62 62 69  Resp:    14  Temp:      TempSrc:      SpO2: 96% 99% 98% 96%  Weight:      Height:      PainSc:        Isolation Precautions No active isolations  Medications Medications  fentaNYL (SUBLIMAZE) injection 25-50 mcg (has no administration in time range)  methocarbamol (ROBAXIN) 1,000 mg in dextrose 5 % 50 mL IVPB (has no administration in time range)  acetaminophen (TYLENOL) suppository 650 mg (has no administration in time range)  ondansetron (ZOFRAN) injection 4 mg (has no administration in time range)    Or  ondansetron (ZOFRAN) 8 mg in sodium chloride 0.9 % 50 mL IVPB (has no administration in time range)  lip balm (CARMEX) ointment 1 application (1 application Topical Given 07/16/17 0812)  magic mouthwash (15 mLs Oral Given 07/16/17 1021)  bisacodyl (DULCOLAX) suppository 10 mg (has no administration in time range)  guaiFENesin-dextromethorphan (ROBITUSSIN DM) 100-10 MG/5ML syrup 10 mL (has no administration in time range)  hydrocortisone (ANUSOL-HC) 2.5 % rectal cream 1 application (has no administration in time range)  alum &  mag hydroxide-simeth (MAALOX/MYLANTA) 200-200-20 MG/5ML suspension 30 mL (has no administration in time range)  hydrocortisone cream 1 % 1 application (has no administration in time range)  menthol-cetylpyridinium (CEPACOL) lozenge 3 mg (has no administration in time range)  phenol (CHLORASEPTIC) mouth spray 1-2 spray (has no administration in time range)  diphenhydrAMINE (BENADRYL) injection 12.5-25 mg (has no administration in time range)  enalaprilat (VASOTEC) injection 0.625-1.25 mg (has no administration in time range)  enoxaparin (LOVENOX) injection 40 mg (has no administration in time range)  dextrose 5 % and 0.45 % NaCl with KCl 20 mEq/L infusion (has no administration in time range)  famotidine (PEPCID) IVPB 20 mg premix (has no administration in time range)  albuterol (PROVENTIL) (2.5 MG/3ML) 0.083% nebulizer solution 2.5 mg (has no administration in time range)  fluticasone (FLONASE) 50 MCG/ACT nasal spray 1 spray (has no administration in time range)  metoprolol tartrate (LOPRESSOR) injection 5 mg (has no administration in time range)  hydrALAZINE (APRESOLINE) injection 10 mg (has no administration in time range)  ondansetron (ZOFRAN) injection 4 mg (has no administration in time range)  fentaNYL (SUBLIMAZE) injection 50 mcg (50  mcg Intravenous Given 07/16/17 0408)  ondansetron (ZOFRAN) injection 4 mg (4 mg Intravenous Given 07/16/17 0406)  iopamidol (ISOVUE-300) 61 % injection 100 mL (100 mLs Intravenous Contrast Given 07/16/17 0419)  fentaNYL (SUBLIMAZE) injection 100 mcg (100 mcg Intravenous Given 07/16/17 0539)  lactated ringers bolus 1,000 mL (0 mLs Intravenous Stopped 07/16/17 0942)  diatrizoate meglumine-sodium (GASTROGRAFIN) 66-10 % solution 90 mL (90 mLs Per NG tube Given 07/16/17 0818)    Mobility walks

## 2017-07-16 NOTE — ED Notes (Signed)
Patient escorted to the bathroom and a urine"hat " placed in the toilet for I and O. Explained to patient that urine needed to be measured as ordered.

## 2017-07-16 NOTE — ED Notes (Signed)
Patient had a large loose stool.

## 2017-07-16 NOTE — H&P (Signed)
[facility] Admission Note  Cynthia Peters 02/19/1950  [id number].    Requesting MD: Delora Fuel Chief Complaint/Reason for Consult: SBO  HPI:  Cynthia Peters is a 67yo female who was transferred from [place] to [facility] for evaluation by general surgery for a SBO. States that she started having upper abdominal pain early this morning, radiating into her back. Pain was dull and crampy and gradually worsening. Denies any n/v. Passing flatus this morning. Last BM yesterday. Typically has at least 1 BM daily; takes miralax PRN. Denies dysuria, fever, chills. States that she had 1 prior SBO about 1 month ago that improved with medical management. CT scan today shows prominent, borderline dilated small bowel in the central, low mid abdomen with decompressed distal ileum; mild inflammatory stranding surrounding a loop of small bowel in the right lower quadrant that may indicate partial obstruction. WBC 10.6, afebrile.  PMH significant for HTN, GERD, asthma Abdominal surgical history:  -c section -umbilical hernia repair 6295 -ex lap LOA, explanation of previous mesh, bilateral posterior musculocutaneous flaps hernia repair with mesh 10/25/2014 Dr. Rosendo Gros -laparoscopic LOA and incisional hernia repair 03/26/2015 Dr. Rosendo Gros Anticoagulants: none Nonsmoker  ROS: Review of Systems  Constitutional: Negative.   HENT: Positive for sore throat.   Eyes: Negative.   Respiratory: Negative.   Cardiovascular: Negative.   Gastrointestinal: Positive for abdominal pain. Negative for constipation, diarrhea, nausea and vomiting.  Genitourinary: Negative.   Musculoskeletal: Negative.   Skin: Negative.   Neurological: Negative.    All systems reviewed and otherwise negative except for as above  Family History  Problem Relation Age of Onset  . Dementia Mother   . Leukemia Child   . Cirrhosis Father     Past Medical History:  Diagnosis Date  . Anemia    history in  the past, not on a regular basis  . Arthritis   . Bronchitis   . Cataracts, bilateral    immature  . Claustrophobia    takes Valium daily as needed  . Complication of anesthesia    2013 SURGERY vocal cord bothering pt, used smaller ent tube after 2 hernia repair, no problem after . " I woke up during my MRI and colonoscopy."       . Depression    takes Wellbutrin daily  . Dry eye   . Dry mouth   . GERD (gastroesophageal reflux disease)    takes Omeprazole daily   . H/O hiatal hernia   . Headache    migraines with flashing lights   . History of blood clots 40 yrs    leg   . History of echocardiogram 2013   a. Echo (05/2011): Mild LVH, EF 65-70%, no WMA, Gr 1 DD, mild AI.  Marland Kitchen History of MRSA infection   . History of staph infection   . History of stress test 2010   a. ETT-Echo in 2010 was normal  . History of TIA (transient ischemic attack) 2013   a. Carotid US (05/2011): No ICA stenosis, pt unsure of this  . Hyperlipidemia    takes Simvastatin daily   . Hypertension    takes  Losartan daily  . IBS (irritable bowel syndrome)    takes Bentyl daily as needed  . Insomnia    takes Ambien nightly  . Interstitial cystitis   . Lichen planus   . Mild asthma    Albuterol inhaler as needed  . Palpitations    Event monitor in 2007 with rare PVCs //  takes Metoprolol daily  . PVC (premature ventricular contraction)   . Seasonal allergies    takes Claritin daily as needed.Takes Singulair daily as needed.  Marland Kitchen TIA (transient ischemic attack) 05/27/2011  . Urethral stricture   . Vertigo    takes Meclizine daily as needed    Past Surgical History:  Procedure Laterality Date  . CESAREAN SECTION    . COLONOSCOPY WITH PROPOFOL N/A 08/10/2012   Procedure: COLONOSCOPY WITH PROPOFOL;  Surgeon: Garlan Fair, MD;  Location: [facility];  Service: Endoscopy;  Laterality: N/A;  . CYSTOSCOPY WITH URETHRAL DILATATION N/A 02/18/2013   Procedure: CYSTOSCOPY WITH URETHRAL DILATATION ( NO  BALLOON) AND HYDRODISTENSION;  Surgeon: Alexis Frock, MD;  Location: [facility];  Service: Urology;  Laterality: N/A;  . ENDOMETRIAL ABLATION W/ HYDROTHERMABLATOR  09-27-2002  . INCISIONAL HERNIA REPAIR N/A 10/25/2014   Procedure: HERNIA REPAIR INCISIONAL;  Surgeon: Ralene Ok, MD;  Location: [place];  Service: General;  Laterality: N/A;  . INCISIONAL HERNIA REPAIR N/A 03/26/2015   Procedure: LAPAROSCOPIC INCISIONAL HERNIA;  Surgeon: Ralene Ok, MD;  Location: [place];  Service: General;  Laterality: N/A;  . INSERTION OF MESH N/A 10/25/2014   Procedure: INSERTION OF MESH;  Surgeon: Ralene Ok, MD;  Location: [place];  Service: General;  Laterality: N/A;  . LAPAROSCOPIC LYSIS OF ADHESIONS N/A 03/26/2015   Procedure: LAPAROSCOPIC LYSIS OF ADHESIONS;  Surgeon: Ralene Ok, MD;  Location: [place];  Service: General;  Laterality: N/A;  . LAPAROTOMY N/A 10/25/2014   Procedure: EXPLORATORY LAPAROTOMY;  Surgeon: Ralene Ok, MD;  Location: [place];  Service: General;  Laterality: N/A;  . LYSIS OF ADHESION N/A 10/25/2014   Procedure: LYSIS OF ADHESIONS ;  Surgeon: Ralene Ok, MD;  Location: [place];  Service: General;  Laterality: N/A;  . MUCOSAL ADVANCEMENT FLAP N/A 10/25/2014   Procedure: MUCOSAL ADVANCEMENT FLAP;  Surgeon: Ralene Ok, MD;  Location: [place];  Service: General;  Laterality: N/A;  . ORIF RADIAL FRACTURE Right 06/17/2016   Procedure: right radial head arthroplasty with ligament repair, reconstruciton;  Surgeon: Roseanne Kaufman, MD;  Location: [place];  Service: Orthopedics;  Laterality: Right;  . RADIAL HEAD ARTHROPLASTY Right 06/17/2016  . TOOTH EXTRACTION    . TRANSTHORACIC ECHOCARDIOGRAM  05-26-2011   MILD LVH/  EF 06-30%/  GRADE I DIASTOLIC DYSFUNCTION/  MILD AR//   06-20-2008  NOMRAL STRESS ECHO  . UMBILICAL HERNIA REPAIR  2013   AND REVISION THE SAME YEAR W/ MESH  . WISDOM TOOTH EXTRACTION  AGE 18    Social History:  reports that she quit smoking  about 41 years ago. Her smoking use included cigarettes. She has a 0.50 pack-year smoking history. She has never used smokeless tobacco. She reports that she drinks alcohol. She reports that she does not use drugs.  Allergies:  Allergies  Allergen Reactions  . Hydrocodone Rash  . Hydrocodone-Acetaminophen Rash  . Tramadol Palpitations     (Not in a hospital admission)  Prior to Admission medications   Medication Sig Start Date End Date Taking? Authorizing Provider  acetaminophen (TYLENOL) 500 MG tablet Take 1,000 mg by mouth every 6 (six) hours as needed (pain).    Yes [provider]  albuterol (PROVENTIL HFA;VENTOLIN HFA) 108 (90 BASE) MCG/ACT inhaler Inhale 2 puffs into the lungs every 6 (six) hours as needed for wheezing or shortness of breath.   Yes [provider]  ALPRAZolam Duanne Moron) 0.25 MG tablet Take I pill every 6 hours as needed for  anxiety Patient taking differently: Take 0.25 mg by mouth every 6 (six) hours as needed for anxiety. Take I pill every 6 hours as needed for anxiety 06/18/16  Yes Avelina Laine, PA-C  aspirin 81 MG chewable tablet Chew 81 mg by mouth daily.   Yes [provider]  buPROPion (WELLBUTRIN XL) 300 MG 24 hr tablet Take 300 mg by mouth daily.    Yes [provider]  Cholecalciferol (VITAMIN D) 2000 units CAPS Take 2,000 Units by mouth daily.   Yes [provider]  clobetasol cream (TEMOVATE) 0.24 % Apply 1 application topically 2 (two) times daily as needed (rash).  04/01/16  Yes [provider]  Coenzyme Q10 (CO Q 10) 100 MG CAPS Take 100 mg by mouth daily.    Yes [provider]  [employer] VITAMIN C 1000 MG tablet Take 1,000 mg by mouth daily.  06/13/16  Yes [provider]  cycloSPORINE (RESTASIS) 0.05 % ophthalmic emulsion Place 1 drop into both eyes 2 (two) times daily.   Yes [provider]  dicyclomine (BENTYL) 10 MG capsule Take 10 mg by mouth every 4 (four) hours as needed  (for abdominal cramps).  02/13/15  Yes [provider]  Evening Primrose Oil 1000 MG CAPS Take 1,000 mg by mouth 2 (two) times daily.    Yes [provider]  Fexofenadine HCl (ALLEGRA PO) Take 1 tablet by mouth daily as needed (ALLERGIES).   Yes [provider]  Flaxseed, Linseed, 1000 MG CAPS Take 1,000 mg by mouth 2 (two) times daily.    Yes [provider]  fluticasone (FLONASE) 50 MCG/ACT nasal spray Place 2 sprays into both nostrils daily as needed for allergies or rhinitis.    Yes [provider]  hydrochlorothiazide 25 MG tablet Take 25 mg by mouth daily.    Yes [provider]  HYDROCORTISONE ACE, RECTAL, 30 MG SUPP Place 30 mg rectally 2 (two) times daily as needed for hemorrhoids. 04/07/16  Yes [provider]  ibuprofen (ADVIL,MOTRIN) 200 MG tablet Take 400-600 mg by mouth 2 (two) times daily as needed (pain).    Yes [provider]  lidocaine (XYLOCAINE) 5 % ointment Apply 1 application topically 3 (three) times daily as needed for mild pain or moderate pain.   Yes [provider]  losartan (COZAAR) 100 MG tablet TAKE 1 TABLET BY MOUTH EVERY DAY 09/22/16  Yes Martinique, Peter M, MD  meclizine (ANTIVERT) 25 MG tablet Take 1 tablet (25 mg total) by mouth 3 (three) times daily as needed for dizziness. 04/14/13  Yes Molpus, John, MD  metoprolol succinate (TOPROL-XL) 100 MG 24 hr tablet Take 1 tablet in the morning and 1/2 tablet (50 mg) in the evening. Take with or immediately following a meal. 08/29/16  Yes Rosita Fire, Brittainy M, PA-C  montelukast (SINGULAIR) 10 MG tablet Take 10 mg by mouth daily.    Yes [provider]  Multiple Vitamin (MULTIVITAMIN WITH MINERALS) TABS Take 1 tablet by mouth daily.   Yes [provider]  neomycin-polymyxin-dexamethasone (MAXITROL) 0.1 % ophthalmic suspension Place 1 drop into both eyes 4 (four) times daily. 07/01/17  Yes [provider]  nystatin cream  (MYCOSTATIN) Apply 1 application topically 2 (two) times daily as needed for dry skin.   Yes [provider]  omeprazole (PRILOSEC) 20 MG capsule Take 20 mg by mouth daily.   Yes [provider]  ondansetron (ZOFRAN) 4 MG tablet Take 1 tablet (4 mg total) by mouth every  8 (eight) hours as needed for up to 10 doses for nausea or vomiting. 05/31/17  Yes Arrien, Jimmy Picket, MD  PAZEO 0.7 % SOLN Place 1 drop into both eyes daily as needed for allergies. 06/05/16  Yes [provider]  phenazopyridine (PYRIDIUM) 200 MG tablet Take 200 mg by mouth 3 (three) times daily as needed for pain [place]ock TRACT PAIN).   Yes [provider]  potassium chloride SA (KLOR-CON M20) 20 MEQ tablet Take 1.5 tablets (30 mEq total) by mouth daily. 08/29/16  Yes Simmons, Brittainy M, PA-C  PREVIDENT 5000 DRY MOUTH 1.1 % GEL dental gel USE ONCE DAILY IN PLACE OF REGULAR TOOTHEPASTE 09/01/14  Yes [provider]  Probiotic Product (ALIGN PO) Take 1 capsule by mouth 2 (two) times daily.    Yes [provider]  simvastatin (ZOCOR) 40 MG tablet Take 40 mg by mouth daily.    Yes [provider]  valACYclovir (VALTREX) 500 MG tablet INCREASE TO TAKING 1 TABLET 2 TIMES A DAY FOR 3 DAYS WITH OUTBREAK Patient taking differently: TAKE 1 TABLET BY MOUTH DAILY 03/25/16  Yes Kem Boroughs, FNP  zolpidem (AMBIEN) 10 MG tablet Take 10 mg by mouth at bedtime as needed for sleep. 07/15/17  Yes [provider]  nystatin (MYCOSTATIN) 100000 UNIT/ML suspension Take 5 mLs by mouth daily as needed (FLARES, LICHEN PLANTIS).    [provider]  pantoprazole (PROTONIX) 20 MG tablet Take 20 mg by mouth daily. 06/16/17   [provider]    Blood pressure (!) 166/76, pulse 75, temperature 98.2 F (36.8 C), temperature source Oral, resp. rate 15, height '5\' 5"'$  (1.651 m), weight 169 lb (76.7 kg), SpO2 100 %. Physical Exam: General: pleasant, WD/WN white female who is  laying in bed in NAD HEENT: head is normocephalic, atraumatic.  Sclera are noninjected.  Pupils equal and round.  Ears and nose without any masses or lesions.  Mouth is pink and moist. Dentition fair Heart: regular, rate, and rhythm.  No obvious murmurs, gallops, or rubs noted.  Palpable pedal pulses bilaterally Lungs: CTAB, no wheezes, rhonchi, or rales noted.  Respiratory effort nonlabored Abd: well healed midline incision, soft, mild distension, +BS, nontender, no HSM or hernia MS: all 4 extremities are symmetrical with no cyanosis, clubbing, or edema. Skin: warm and dry with no masses, lesions, or rashes Psych: A&Ox3 with an appropriate affect. Neuro: cranial nerves grossly intact, extremity CSM intact bilaterally, normal speech  Results for orders placed or performed during the hospital encounter of 07/16/17 (from the past 48 hour(s))  Urinalysis, Routine w reflex microscopic     Status: Abnormal   Collection Time: 07/16/17  3:44 AM  Result Value Ref Range   Color, Urine YELLOW YELLOW   APPearance CLEAR CLEAR   Specific Gravity, Urine 1.010 1.005 - 1.030   pH 7.0 5.0 - 8.0   Glucose, UA NEGATIVE NEGATIVE mg/dL   Hgb urine dipstick NEGATIVE NEGATIVE   Bilirubin Urine NEGATIVE NEGATIVE   Ketones, ur NEGATIVE NEGATIVE mg/dL   Protein, ur NEGATIVE NEGATIVE mg/dL   Nitrite NEGATIVE NEGATIVE   Leukocytes, UA MODERATE (A) NEGATIVE    Comment: Performed at [facility], [address] [place], [place] [address]  Urinalysis, Microscopic (reflex)     Status: Abnormal   Collection Time: 07/16/17  3:44 AM  Result Value Ref Range   RBC / HPF 0-5 0 - 5 RBC/hpf   WBC, UA 6-10 0 - 5 WBC/hpf   Bacteria,  UA FEW (A) NONE SEEN   Squamous Epithelial / LPF 0-5 0 - 5    Comment: Performed at [facility], [address] [place], [place] [address]  CBC with Differential/Platelet     Status: Abnormal   Collection Time: 07/16/17  3:49 AM  Result Value Ref Range   WBC 10.6 (H)  4.0 - 10.5 K/uL   RBC 4.22 3.87 - 5.11 MIL/uL   Hemoglobin 14.3 12.0 - 15.0 g/dL   HCT 40.7 36.0 - 46.0 %   MCV 96.4 78.0 - 100.0 fL   MCH 33.9 26.0 - 34.0 pg   MCHC 35.1 30.0 - 36.0 g/dL   RDW 12.7 11.5 - 15.5 %   Platelets 184 150 - 400 K/uL   Neutrophils Relative % 68 %   Neutro Abs 7.1 1.7 - 7.7 K/uL   Lymphocytes Relative 18 %   Lymphs Abs 1.9 0.7 - 4.0 K/uL   Monocytes Relative 9 %   Monocytes Absolute 1.0 0.1 - 1.0 K/uL   Eosinophils Relative 5 %   Eosinophils Absolute 0.6 0.0 - 0.7 K/uL   Basophils Relative 0 %   Basophils Absolute 0.0 0.0 - 0.1 K/uL    Comment: Performed at [facility], [address] [place], [place] [address]  Basic metabolic panel     Status: Abnormal   Collection Time: 07/16/17  3:49 AM  Result Value Ref Range   Sodium 140 135 - 145 mmol/L   Potassium 4.0 3.5 - 5.1 mmol/L   Chloride 101 101 - 111 mmol/L   CO2 30 22 - 32 mmol/L   Glucose, Bld 118 (H) 65 - 99 mg/dL   BUN 21 (H) 6 - 20 mg/dL   Creatinine, Ser 0.92 0.44 - 1.00 mg/dL   Calcium 9.4 8.9 - 10.3 mg/dL   GFR calc non Af Amer >60 >60 mL/min   GFR calc Af Amer >60 >60 mL/min    Comment: (NOTE) The eGFR has been calculated using the CKD EPI equation. This calculation has not been validated in all clinical situations. eGFR's persistently <60 mL/min signify possible Chronic Kidney Disease.    Anion gap 9 5 - 15    Comment: Performed at [facility], [address] [place], [place] [address]   Ct Abdomen Pelvis W Contrast  Result Date: 07/16/2017 CLINICAL DATA:  Abdominal pain EXAM: CT ABDOMEN AND PELVIS WITH CONTRAST TECHNIQUE: Multidetector CT imaging of the abdomen and pelvis was performed using the standard protocol following bolus administration of intravenous contrast. CONTRAST:  141m ISOVUE-300 IOPAMIDOL (ISOVUE-300) INJECTION 61% COMPARISON:  None. FINDINGS: LOWER CHEST: No basilar pulmonary nodules or pleural effusion. No apical pericardial effusion.  HEPATOBILIARY: Unchanged 2.5 cm hepatic cyst. Smaller hypodense foci are also unchanged. Normal gallbladder. PANCREAS: Normal parenchymal contours without ductal dilatation. No peripancreatic fluid collection. SPLEEN: Normal. ADRENALS/URINARY TRACT: --Adrenal glands: Normal. --Right kidney/ureter: No hydronephrosis, nephroureterolithiasis, perinephric stranding or solid renal mass. --Left kidney/ureter: No hydronephrosis, nephroureterolithiasis, perinephric stranding or solid renal mass. --Urinary bladder: Normal for degree of distention STOMACH/BOWEL: --Stomach/Duodenum: No hiatal hernia or other gastric abnormality. Normal duodenal course. --Small bowel: Multiple loops of prominent, but not pathologically dilated, small bowel in the central low mid abdomen with decompression of the distal ileum. Caliber transition occurs in the right lower quadrant. There is mild stranding surrounding a loop in the right lower quadrant (coronal image 39, axial image 62). --Colon: No focal abnormality. --Appendix: Normal. VASCULAR/LYMPHATIC: Atherosclerotic calcification is present within  the non-aneurysmal abdominal aorta, without hemodynamically significant stenosis. The portal vein, splenic vein, superior mesenteric vein and IVC are patent. No abdominal or pelvic lymphadenopathy. REPRODUCTIVE: Normal uterus and ovaries. MUSCULOSKELETAL. Grade 1 anterolisthesis at L4-L5 secondary to facet hypertrophy. OTHER: None. IMPRESSION: 1. Prominent, borderline dilated small bowel in the central, low mid abdomen with decompressed distal ileum. Mild inflammatory stranding surrounding a loop of small bowel in the right lower quadrant. This may indicate partial obstruction or adynamic ileus secondary to active inflammation. The appearance is very similar to the 05/29/2017 study, but the location of the inflammatory changes different. 2.  Aortic Atherosclerosis (ICD10-I70.0). 3. Grade 1 L4-5 anterolisthesis secondary to facet arthrosis.  Electronically Signed   By: Ulyses Jarred M.D.   On: 07/16/2017 04:46   Dg Abd Portable 1v-small Bowel Protocol-position Verification  Result Date: 07/16/2017 CLINICAL DATA:  67 year old female NG tube placement. EXAM: PORTABLE ABDOMEN - 1 VIEW COMPARISON:  CT Abdomen and Pelvis 0423 hours today. FINDINGS: Portable AP upright view at 0656 hours. An enteric tube courses into the proximal stomach from the lower mediastinum. The side hole is at the level of the distal thoracic esophagus (arrow). Negative left lung base. Elevated right hemidiaphragm again noted. IV contrast is being excreted from both kidneys, proximal ureters appear within normal limits. Stable bowel gas pattern. No osseous abnormality identified. IMPRESSION: Enteric tube coursing into the proximal stomach, side hole still the distal esophagus. Advance the tube 6 centimeters to ensure side hole placement within the stomach. Electronically Signed   By: Genevie Ann M.D.   On: 07/16/2017 08:01      Assessment/Plan HTN GERD Asthma  SBO - multiple prior abdominal surgeries, 1 prior SBO about 1 month ago that resolved medically - CT scan today shows prominent, borderline dilated small bowel in the central, low mid abdomen with decompressed distal ileum; mild inflammatory stranding surrounding a loop of small bowel in the right lower quadrant that may indicate partial obstruction - passing flatus this morning  ID - none VTE - SCDs, lovenox FEN - IVF, NPO/NGT Foley - none Follow up - TBD  Plan - Admit to med-surg. Will start patient on small bowel protocol with NG tube decompression, gastrograffin administration and delayed abdominal film. Keep NPO and continue NG tube to LIWS.   Wellington Hampshire, [facility] 07/16/2017, 10:32 AM Pager: [phone] Consults: [phone] Mon 7:00 am -11:30 AM Tues-Fri 7:00 am-4:30 pm Sat-Sun 7:00 am-11:30 am

## 2017-07-16 NOTE — ED Notes (Signed)
NG placed back to suction.

## 2017-07-16 NOTE — ED Notes (Signed)
NG clamped following Gastrograffin administration.

## 2017-07-16 NOTE — ED Provider Notes (Signed)
[facility] EMERGENCY DEPARTMENT Provider Note   CSN: [id number] Arrival date & time: 07/16/17  0303     History   Chief Complaint Chief Complaint  Patient presents with  . Abdominal Pain    HPI Cynthia Peters is a 67 y.o. female.  The history is provided by the patient.  Abdominal Pain   This is a recurrent problem. The current episode started 3 to 5 hours ago. The problem occurs constantly. The problem has not changed since onset.The pain is associated with an unknown factor. The pain is located in the LUQ and RUQ. The quality of the pain is cramping. The pain is moderate. Associated symptoms include flatus. Pertinent negatives include anorexia, fever, belching, diarrhea, hematochezia, melena, nausea, vomiting, constipation, dysuria, frequency, hematuria, headaches, arthralgias and myalgias. Nothing aggravates the symptoms. Nothing relieves the symptoms. Past workup includes CT scan. Her past medical history does not include ulcerative colitis or Crohn's disease.    Past Medical History:  Diagnosis Date  . Anemia    history in the past, not on a regular basis  . Arthritis   . Bronchitis   . Cataracts, bilateral    immature  . Claustrophobia    takes Valium daily as needed  . Complication of anesthesia    2013 SURGERY vocal cord bothering pt, used smaller ent tube after 2 hernia repair, no problem after . " I woke up during my MRI and colonoscopy."       . Depression    takes Wellbutrin daily  . Dry eye   . Dry mouth   . GERD (gastroesophageal reflux disease)    takes Omeprazole daily   . H/O hiatal hernia   . Headache    migraines with flashing lights   . History of blood clots 40 yrs    leg   . History of echocardiogram 2013   a. Echo (05/2011): Mild LVH, EF 65-70%, no WMA, Gr 1 DD, mild AI.  Marland Kitchen History of MRSA infection   . History of staph infection   . History of stress test 2010   a. ETT-Echo in 2010 was normal  . History of TIA (transient  ischemic attack) 2013   a. Carotid US (05/2011): No ICA stenosis, pt unsure of this  . Hyperlipidemia    takes Simvastatin daily   . Hypertension    takes  Losartan daily  . IBS (irritable bowel syndrome)    takes Bentyl daily as needed  . Insomnia    takes Ambien nightly  . Interstitial cystitis   . Lichen planus   . Mild asthma    Albuterol inhaler as needed  . Palpitations    Event monitor in 2007 with rare PVCs // takes Metoprolol daily  . PVC (premature ventricular contraction)   . Seasonal allergies    takes Claritin daily as needed.Takes Singulair daily as needed.  Marland Kitchen Urethral stricture   . Vertigo    takes Meclizine daily as needed    Patient Active Problem List   Diagnosis Date Noted  . Partial small bowel obstruction ([place]) 05/29/2017  . SBO (small bowel obstruction) ([place]) 05/29/2017  . Right radial head fracture 06/17/2016  . S/P hernia repair 03/26/2015  . S/P repair of ventral hernia 10/25/2014  . Abdominal hernia 10/20/2014  . Anxiety 10/20/2014  . 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 Interstitial cystitis 03/15/2013  . Lichen planus [id number]  . TIA (transient ischemic attack) 05/27/2011  . Hypokalemia 05/26/2011  . PVC's (premature ventricular contractions)   .  Hypertension   . Hyperlipidemia     Past Surgical History:  Procedure Laterality Date  . CESAREAN SECTION    . COLONOSCOPY WITH PROPOFOL N/A 08/10/2012   Procedure: COLONOSCOPY WITH PROPOFOL;  Surgeon: Garlan Fair, MD;  Location: [facility];  Service: Endoscopy;  Laterality: N/A;  . CYSTOSCOPY WITH URETHRAL DILATATION N/A 02/18/2013   Procedure: CYSTOSCOPY WITH URETHRAL DILATATION ( NO BALLOON) AND HYDRODISTENSION;  Surgeon: Alexis Frock, MD;  Location: [facility];  Service: Urology;  Laterality: N/A;  . ENDOMETRIAL ABLATION W/ HYDROTHERMABLATOR  09-27-2002  . INCISIONAL HERNIA REPAIR N/A 10/25/2014   Procedure: HERNIA REPAIR INCISIONAL;  Surgeon: Ralene Ok, MD;  Location:  [facility];  Service: General;  Laterality: N/A;  . INCISIONAL HERNIA REPAIR N/A 03/26/2015   Procedure: LAPAROSCOPIC INCISIONAL HERNIA;  Surgeon: Ralene Ok, MD;  Location: [facility];  Service: General;  Laterality: N/A;  . INSERTION OF MESH N/A 10/25/2014   Procedure: INSERTION OF MESH;  Surgeon: Ralene Ok, MD;  Location: [facility];  Service: General;  Laterality: N/A;  . LAPAROSCOPIC LYSIS OF ADHESIONS N/A 03/26/2015   Procedure: LAPAROSCOPIC LYSIS OF ADHESIONS;  Surgeon: Ralene Ok, MD;  Location: [place];  Service: General;  Laterality: N/A;  . LAPAROTOMY N/A 10/25/2014   Procedure: EXPLORATORY LAPAROTOMY;  Surgeon: Ralene Ok, MD;  Location: [place];  Service: General;  Laterality: N/A;  . LYSIS OF ADHESION N/A 10/25/2014   Procedure: LYSIS OF ADHESIONS ;  Surgeon: Ralene Ok, MD;  Location: [place];  Service: General;  Laterality: N/A;  . MUCOSAL ADVANCEMENT FLAP N/A 10/25/2014   Procedure: MUCOSAL ADVANCEMENT FLAP;  Surgeon: Ralene Ok, MD;  Location: [facility];  Service: General;  Laterality: N/A;  . ORIF RADIAL FRACTURE Right 06/17/2016   Procedure: right radial head arthroplasty with ligament repair, reconstruciton;  Surgeon: Roseanne Kaufman, MD;  Location: [facility];  Service: Orthopedics;  Laterality: Right;  . RADIAL HEAD ARTHROPLASTY Right 06/17/2016  . TOOTH EXTRACTION    . TRANSTHORACIC ECHOCARDIOGRAM  05-26-2011   MILD LVH/  EF 36-64%/  GRADE I DIASTOLIC DYSFUNCTION/  MILD AR//   06-20-2008  NOMRAL STRESS ECHO  . UMBILICAL HERNIA REPAIR  2013   AND REVISION THE SAME YEAR W/ MESH  . WISDOM TOOTH EXTRACTION  AGE 5     OB History    Gravida  3   Para  3   Term  3   Preterm      AB      Living  2     SAB      TAB      Ectopic      Multiple      Live Births               Home Medications    Prior to Admission medications   Medication Sig Start Date End Date Taking? Authorizing Provider  acetaminophen (TYLENOL) 500 MG tablet Take 1,000 mg by mouth  every 6 (six) hours as needed (pain).     [provider]  albuterol (PROVENTIL HFA;VENTOLIN HFA) 108 (90 BASE) MCG/ACT inhaler Inhale 2 puffs into the lungs every 6 (six) hours as needed for wheezing or shortness of breath.    [provider]  ALPRAZolam Duanne Moron) 0.25 MG tablet Take I pill every 6 hours as needed for anxiety 06/18/16   Avelina Laine, PA-C  aspirin 81 MG chewable tablet Chew 81 mg by mouth daily.    [provider]  buPROPion (WELLBUTRIN XL) 300 MG  24 hr tablet Take 300 mg by mouth daily.     [provider]  Cholecalciferol (VITAMIN D) 2000 units CAPS Take 2,000 Units by mouth daily.    [provider]  clobetasol cream (TEMOVATE) 6.96 % Apply 1 application topically 2 (two) times daily as needed. 04/01/16   [provider]  Coenzyme Q10 (CO Q 10) 100 MG CAPS Take 100 mg by mouth daily.     [provider]  [employer]S VITAMIN C 1000 MG tablet Take 1,000 mg by mouth 2 (two) times daily.  06/13/16   [provider]  cycloSPORINE (RESTASIS) 0.05 % ophthalmic emulsion Place 1 drop into both eyes 2 (two) times daily.    [provider]  dicyclomine (BENTYL) 10 MG capsule Take 10 mg by mouth every 4 (four) hours as needed (for abdominal cramps).  02/13/15   [provider]  Evening Primrose Oil 1000 MG CAPS Take 1,000 mg by mouth 2 (two) times daily.     [provider]  Fexofenadine HCl (ALLEGRA PO) Take 1 tablet by mouth daily as needed (ALLERGIES).    [provider]  Flaxseed, Linseed, 1000 MG CAPS Take 1,000 mg by mouth 2 (two) times daily.     [provider]  fluticasone (FLONASE) 50 MCG/ACT nasal spray Place 2 sprays into both nostrils daily as needed for allergies or rhinitis.     [provider]  hydrochlorothiazide 25 MG tablet Take 25 mg by mouth daily.     [provider]  HYDROCORTISONE ACE, RECTAL, 30 MG SUPP Place 30 mg rectally 2 (two) times daily  as needed for hemorrhoids. 04/07/16   [provider]  ibuprofen (ADVIL,MOTRIN) 200 MG tablet Take 400-600 mg by mouth 2 (two) times daily as needed (pain).     [provider]  indomethacin (INDOCIN SR) 75 MG CR capsule Take 75 mg by mouth daily. with food 06/13/16   [provider]  lidocaine (XYLOCAINE) 5 % ointment Apply 1 application topically 3 (three) times daily as needed for mild pain or moderate pain.    [provider]  losartan (COZAAR) 100 MG tablet TAKE 1 TABLET BY MOUTH EVERY DAY 09/22/16   Martinique, Peter M, MD  meclizine (ANTIVERT) 25 MG tablet Take 1 tablet (25 mg total) by mouth 3 (three) times daily as needed for dizziness. 04/14/13   Molpus, John, MD  metoprolol succinate (TOPROL-XL) 100 MG 24 hr tablet Take 1 tablet in the morning and 1/2 tablet (50 mg) in the evening. Take with or immediately following a meal. 08/29/16   Lyda Jester M, PA-C  montelukast (SINGULAIR) 10 MG tablet Take 10 mg by mouth daily.     [provider]  Multiple Vitamin (MULTIVITAMIN WITH MINERALS) TABS Take 1 tablet by mouth daily.    [provider]  nystatin (MYCOSTATIN) 100000 UNIT/ML suspension Take 5 mLs by mouth daily as needed (FLARES, LICHEN PLANTIS).    [provider]  nystatin cream (MYCOSTATIN) Apply 1 application topically 2 (two) times daily as needed for dry skin.    [provider]  omeprazole (PRILOSEC) 20 MG capsule Take 20 mg by mouth daily.    [provider]  ondansetron (ZOFRAN) 4 MG tablet Take 1 tablet (4 mg total) by mouth every 8 (eight) hours as needed for up to 10 doses for nausea or vomiting. 05/31/17   Arrien, Jimmy Picket, MD  PAZEO 0.7 % SOLN Place 1 drop into both eyes daily as needed  for allergies. 06/05/16   [provider]  phenazopyridine (PYRIDIUM) 200 MG tablet Take 200 mg by mouth 3 (three) times daily as needed for pain [place] TRACT PAIN).    [provider]    potassium chloride SA (KLOR-CON M20) 20 MEQ tablet Take 1.5 tablets (30 mEq total) by mouth daily. 08/29/16   Simmons, Brittainy M, PA-C  PREVIDENT 5000 DRY MOUTH 1.1 % GEL dental gel USE ONCE DAILY IN PLACE OF REGULAR TOOTHEPASTE 09/01/14   [provider]  Probiotic Product (ALIGN PO) Take 1 capsule by mouth 2 (two) times daily.     [provider]  simvastatin (ZOCOR) 40 MG tablet Take 40 mg by mouth daily.     [provider]  valACYclovir (VALTREX) 500 MG tablet INCREASE TO TAKING 1 TABLET 2 TIMES A DAY FOR 3 DAYS WITH OUTBREAK Patient taking differently: TAKE 1 TABLET BY MOUTH DAILY 03/25/16   Kem Boroughs, FNP    Family History Family History  Problem Relation Age of Onset  . Dementia Mother   . Leukemia Child   . Cirrhosis Father     Social History Social History   Tobacco Use  . Smoking status: Former Smoker    Packs/day: 0.25    Years: 2.00    Pack years: 0.50    Types: Cigarettes    Last attempt to quit: 02/04/1976    Years since quitting: 41.4  . Smokeless tobacco: Never Used  Substance Use Topics  . Alcohol use: Yes    Comment: occasional  . Drug use: No     Allergies   Hydrocodone; Hydrocodone-acetaminophen; and Tramadol   Review of Systems Review of Systems  Constitutional: Negative for diaphoresis and fever.  HENT: Negative for sore throat.   Respiratory: Negative for shortness of breath.   Cardiovascular: Negative for chest pain.  Gastrointestinal: Positive for abdominal pain and flatus. Negative for anorexia, constipation, diarrhea, hematochezia, melena, nausea and vomiting.  Genitourinary: Negative for dysuria, frequency and hematuria.  Musculoskeletal: Negative for arthralgias and myalgias.  Neurological: Negative for headaches.  All other systems reviewed and are negative.    Physical Exam Updated Vital Signs BP (!) 194/81 (BP Location: Left Arm)   Pulse 63   Temp 98 F (36.7 C) (Oral)   Resp 18   Ht 5\' 5"   (1.651 m)   Wt 76.7 kg (169 lb)   SpO2 100%   BMI 28.12 kg/m   Physical Exam  Constitutional: She is oriented to person, place, and time. She appears well-developed and well-nourished. No distress.  HENT:  Head: Normocephalic and atraumatic.  Mouth/Throat: No oropharyngeal exudate.  Eyes: Pupils are equal, round, and reactive to light. Conjunctivae are normal.  Neck: Normal range of motion. Neck supple.  Cardiovascular: Normal rate, regular rhythm, normal heart sounds and intact distal pulses.  Pulmonary/Chest: Effort normal and breath sounds normal. No stridor. She has no wheezes. She has no rales.  Abdominal: Soft. She exhibits no mass. Bowel sounds are increased. There is no tenderness. There is no rebound and no guarding. No hernia.  Musculoskeletal: Normal range of motion.  Neurological: She is alert and oriented to person, place, and time. She displays normal reflexes.  Skin: Skin is warm and dry. Capillary refill takes less than 2 seconds.  Psychiatric: She has a normal mood and affect.  Nursing note reviewed.    ED Treatments / Results  Labs (all labs ordered are listed, but only abnormal results are displayed) Labs Reviewed  URINALYSIS, ROUTINE W  REFLEX MICROSCOPIC  CBC WITH DIFFERENTIAL/PLATELET  BASIC METABOLIC PANEL    EKG None  Radiology No results found.  Procedures Procedures (including critical care time)  Medications Ordered in ED Medications  fentaNYL (SUBLIMAZE) injection 50 mcg (has no administration in time range)  ondansetron (ZOFRAN) injection 4 mg (has no administration in time range)  0.9 %  sodium chloride infusion (has no administration in time range)  iopamidol (ISOVUE-300) 61 % injection 100 mL (has no administration in time range)     505 Case d/w Dr. Johney Maine of [facility].  Please place large NGT per SBO protocol.  Please send ED to ED.  List of patient's medication read to Dr. Johney Maine via phone. EDP offered to call [facility], but Dr. Johney Maine does not  want [facility] to admit at this time.    962 Case d/w Dr. Roxanne Mins who will accept ED to ED   Patient is staying Final Clinical Impressions(s) / ED Diagnoses  SBO: will admit for same   [place], Alonie Gazzola, MD 07/16/17 [phone]

## 2017-07-16 NOTE — ED Triage Notes (Signed)
Patient states sudden onset upper abd pain this am; denies any NVD. NAD noted

## 2017-07-16 NOTE — ED Provider Notes (Signed)
67 year old female transferred from [place] with diagnosis of small bowel obstruction, sent to the ED to see general surgeon.  Patient is resting comfortably with NG tube in place.  General surgery has been paged.   Delora Fuel, MD 34/96/11 [phone]

## 2017-07-17 ENCOUNTER — Observation Stay (HOSPITAL_COMMUNITY): Payer: Medicare Other

## 2017-07-17 LAB — BASIC METABOLIC PANEL
Anion gap: 8 (ref 5–15)
BUN: 10 mg/dL (ref 6–20)
CO2: 27 mmol/L (ref 22–32)
Calcium: 9.1 mg/dL (ref 8.9–10.3)
Chloride: 104 mmol/L (ref 101–111)
Creatinine, Ser: 0.76 mg/dL (ref 0.44–1.00)
GFR calc Af Amer: >60 mL/min (ref >60)
GFR calc non Af Amer: >60 mL/min (ref >60)
Glucose, Bld: 132 mg/dL — ABNORMAL HIGH (ref 65–99)
Potassium: 3.7 mmol/L (ref 3.5–5.1)
Sodium: 139 mmol/L (ref 135–145)

## 2017-07-17 MED ORDER — SIMVASTATIN 40 MG PO TABS
40.0000 mg | ORAL_TABLET | Freq: Every day | ORAL | Status: DC
  Administered 2017-07-17: 40 mg via ORAL
  Filled 2017-07-17: qty 1

## 2017-07-17 MED ORDER — LORAZEPAM 2 MG/ML IJ SOLN: 1.0000 mg | Freq: Once | INTRAMUSCULAR | Status: DC

## 2017-07-17 MED ORDER — METOPROLOL SUCCINATE ER 50 MG PO TB24
100.0000 mg | ORAL_TABLET | Freq: Every day | ORAL | Status: DC
  Administered 2017-07-17: 100 mg via ORAL
  Filled 2017-07-17: qty 2

## 2017-07-17 MED ORDER — ALBUTEROL SULFATE HFA 108 (90 BASE) MCG/ACT IN AERS: 2.0000 | INHALATION_SPRAY | Freq: Four times a day (QID) | RESPIRATORY_TRACT | Status: DC | PRN

## 2017-07-17 MED ORDER — PSYLLIUM 28 % PO PACK: 1.0000 | PACK | Freq: Every day | ORAL | Status: AC

## 2017-07-17 MED ORDER — LOSARTAN POTASSIUM 50 MG PO TABS
100.0000 mg | ORAL_TABLET | Freq: Every day | ORAL | Status: DC
  Administered 2017-07-17: 100 mg via ORAL
  Filled 2017-07-17: qty 2

## 2017-07-17 MED ORDER — BUPROPION HCL ER (XL) 300 MG PO TB24
300.0000 mg | ORAL_TABLET | Freq: Every day | ORAL | Status: DC
  Administered 2017-07-17: 300 mg via ORAL
  Filled 2017-07-17: qty 1

## 2017-07-17 MED ORDER — PANTOPRAZOLE SODIUM 20 MG PO TBEC
20.0000 mg | DELAYED_RELEASE_TABLET | Freq: Every day | ORAL | Status: DC
Start: 2017-07-17 — End: 2017-07-17
  Administered 2017-07-17: 20 mg via ORAL
  Filled 2017-07-17: qty 1

## 2017-07-17 MED ORDER — MORPHINE SULFATE (PF) 2 MG/ML IV SOLN
2.0000 mg | INTRAVENOUS | Status: DC | PRN
  Filled 2017-07-17: qty 1

## 2017-07-17 NOTE — Discharge Instructions (Signed)
Small Bowel Obstruction °A small bowel obstruction means that something is blocking the small bowel. The small bowel is also called the small intestine. It is the long tube that connects the stomach to the colon. An obstruction will stop food and fluids from passing through the small bowel. Treatment depends on what is causing the problem and how bad the problem is. °Follow these instructions at home: °· Get a lot of rest. °· Follow your diet as told by your doctor. You may need to: °? Only drink clear liquids until you start to get better. °? Avoid solid foods as told by your doctor. °· Take over-the-counter and prescription medicines only as told by your doctor. °· Keep all follow-up visits as told by your doctor. This is important. °Contact a doctor if: °· You have a fever. °· You have chills. °Get help right away if: °· You have pain or cramps that get worse. °· You throw up (vomit) blood. °· You have a feeling of being sick to your stomach (nausea) that does not go away. °· You cannot stop throwing up. °· You cannot drink fluids. °· You feel confused. °· You feel dry or thirsty (dehydrated). °· Your belly gets more bloated. °· You feel weak or you pass out (faint). °This information is not intended to replace advice given to you by your health care provider. Make sure you discuss any questions you have with your health care provider. °Document Released: 02/28/2004 Document Revised: 09/17/2015 Document Reviewed: 03/16/2014 °Elsevier Interactive Patient Education © 2018 Elsevier Inc. ° °

## 2017-07-17 NOTE — Progress Notes (Signed)
[facility] Progress Note     Subjective: CC- SBO Patient states that she is feeling much better this morning. She accidentally pulled out her NG tube over night. Denies n/v. Passing flatus and she had a few loose BMs yesterday.   Objective: Vital signs in last 24 hours: Temp:  [97.8 F (36.6 C)-99.1 F (37.3 C)] 98.2 F (36.8 C) (06/14 0442) Pulse Rate:  [57-75] 75 (06/14 0442) Resp:  [14-18] 14 (06/14 0442) BP: (128-177)/(67-88) 133/71 (06/14 0442) SpO2:  [96 %-100 %] 97 % (06/14 0442) Last BM Date: 07/16/17  Intake/Output from previous day: 06/13 0701 - 06/14 0700 In: 3162.9 [I.V.:1976.3; IV Piggyback:1186.7] Out: 452 [Urine:301; Emesis/NG output:150; Stool:1] Intake/Output this shift: No intake/output data recorded.  PE: Gen:  Alert, NAD, pleasant HEENT: EOM's intact, pupils equal and round Card:  RRR, no M/G/R heard Pulm:  CTAB, no W/R/R, effort normal Abd: Soft, ND, NT, +BS, no HSM, no hernia Ext:  No erythema, edema, or tenderness BUE/BLE  Psych: A&Ox3  Skin: no rashes noted, warm and dry  Lab Results:  Recent Labs    07/16/17 0349  WBC 10.6*  HGB 14.3  HCT 40.7  PLT 184   BMET Recent Labs    07/16/17 0349 07/17/17 0404  NA 140 139  K 4.0 3.7  CL 101 104  CO2 30 27  GLUCOSE 118* 132*  BUN 21* 10  CREATININE 0.92 0.76  CALCIUM 9.4 9.1   PT/INR No results for input(s): LABPROT, INR in the last 72 hours. CMP     Component Value Date/Time   NA 139 07/17/2017 0404   NA 139 08/29/2016 1528   K 3.7 07/17/2017 0404   CL 104 07/17/2017 0404   CO2 27 07/17/2017 0404   GLUCOSE 132 (H) 07/17/2017 0404   BUN 10 07/17/2017 0404   BUN 17 08/29/2016 1528   CREATININE 0.76 07/17/2017 0404   CREATININE 1.34 (H) 09/17/2015 1644   CALCIUM 9.1 07/17/2017 0404   PROT 6.8 07/16/2017 1430   ALBUMIN 4.3 07/16/2017 1430   AST 32 07/16/2017 1430   ALT 31 07/16/2017 1430   ALKPHOS 60 07/16/2017 1430   BILITOT 1.2 07/16/2017 1430   GFRNONAA  >60 07/17/2017 0404   GFRAA >60 07/17/2017 0404   Lipase     Component Value Date/Time   LIPASE 33 05/28/2017 2226       Studies/Results: Ct Abdomen Pelvis W Contrast  Result Date: 07/16/2017 CLINICAL DATA:  Abdominal pain EXAM: CT ABDOMEN AND PELVIS WITH CONTRAST TECHNIQUE: Multidetector CT imaging of the abdomen and pelvis was performed using the standard protocol following bolus administration of intravenous contrast. CONTRAST:  136mL ISOVUE-300 IOPAMIDOL (ISOVUE-300) INJECTION 61% COMPARISON:  None. FINDINGS: LOWER CHEST: No basilar pulmonary nodules or pleural effusion. No apical pericardial effusion. HEPATOBILIARY: Unchanged 2.5 cm hepatic cyst. Smaller hypodense foci are also unchanged. Normal gallbladder. PANCREAS: Normal parenchymal contours without ductal dilatation. No peripancreatic fluid collection. SPLEEN: Normal. ADRENALS/URINARY TRACT: --Adrenal glands: Normal. --Right kidney/ureter: No hydronephrosis, nephroureterolithiasis, perinephric stranding or solid renal mass. --Left kidney/ureter: No hydronephrosis, nephroureterolithiasis, perinephric stranding or solid renal mass. --Urinary bladder: Normal for degree of distention STOMACH/BOWEL: --Stomach/Duodenum: No hiatal hernia or other gastric abnormality. Normal duodenal course. --Small bowel: Multiple loops of prominent, but not pathologically dilated, small bowel in the central low mid abdomen with decompression of the distal ileum. Caliber transition occurs in the right lower quadrant. There is mild stranding surrounding a loop in the right lower quadrant (coronal image 39, axial image 62). --  Colon: No focal abnormality. --Appendix: Normal. VASCULAR/LYMPHATIC: Atherosclerotic calcification is present within the non-aneurysmal abdominal aorta, without hemodynamically significant stenosis. The portal vein, splenic vein, superior mesenteric vein and IVC are patent. No abdominal or pelvic lymphadenopathy. REPRODUCTIVE: Normal uterus and  ovaries. MUSCULOSKELETAL. Grade 1 anterolisthesis at L4-L5 secondary to facet hypertrophy. OTHER: None. IMPRESSION: 1. Prominent, borderline dilated small bowel in the central, low mid abdomen with decompressed distal ileum. Mild inflammatory stranding surrounding a loop of small bowel in the right lower quadrant. This may indicate partial obstruction or adynamic ileus secondary to active inflammation. The appearance is very similar to the 05/29/2017 study, but the location of the inflammatory changes different. 2.  Aortic Atherosclerosis (ICD10-I70.0). 3. Grade 1 L4-5 anterolisthesis secondary to facet arthrosis. Electronically Signed   By: Ulyses Jarred M.D.   On: 07/16/2017 04:46   Dg Abd Portable 1v  Result Date: 07/17/2017 CLINICAL DATA:  NG tube placement EXAM: PORTABLE ABDOMEN - 1 VIEW COMPARISON:  07/17/2017 FINDINGS: The NG tube tip and side port are in unchanged position near the gastroesophageal junction. Advancement by 7 cm recommended. IMPRESSION: Unchanged NG tube position.  Recommend advancing by 7 cm. Electronically Signed   By: Ulyses Jarred M.D.   On: 07/17/2017 04:45   Dg Abd Portable 1v  Result Date: 07/17/2017 CLINICAL DATA:  NG tube position. EXAM: PORTABLE ABDOMEN - 1 VIEW COMPARISON:  Abdominal radiograph yesterday at 1519 hour FINDINGS: The enteric tube is been retracted with side-port now in the distal esophagus. Tip is in the proximal stomach. Advancement of least 7 cm to place the side-port below the diaphragm. Enteric contrast seen throughout the colon. IMPRESSION: Retracted enteric tube with side port in the distal esophagus. Advancement of least 7 cm would place the side-port below the diaphragm. Electronically Signed   By: Jeb Levering M.D.   On: 07/17/2017 01:18   Dg Abd Portable 1v  Result Date: 07/16/2017 CLINICAL DATA:  Nasogastric tube repositioning. Oral contrast administered earlier in the day EXAM: PORTABLE ABDOMEN - 1 VIEW COMPARISON:  Study obtained earlier  in the day FINDINGS: Nasogastric tube tip and side port are in the stomach. The side port is slightly inferior to the gastroesophageal junction, likely in the gastric cardia. Contrast is seen in the distal small bowel and throughout much of the large bowel. There is no bowel dilatation or air-fluid level to suggest bowel obstruction. No evident free air. Visualized lung bases are clear. IMPRESSION: Oral contrast is seen in the distal small bowel as well as throughout much of the large bowel. No bowel dilatation or air-fluid level. No free air. Nasogastric tube tip and side port in stomach. Lung bases clear. Electronically Signed   By: Lowella Grip III M.D.   On: 07/16/2017 15:55   Dg Abd Portable 1v-small Bowel Protocol-position Verification  Result Date: 07/16/2017 CLINICAL DATA:  67 year old female NG tube placement. EXAM: PORTABLE ABDOMEN - 1 VIEW COMPARISON:  CT Abdomen and Pelvis 0423 hours today. FINDINGS: Portable AP upright view at 0656 hours. An enteric tube courses into the proximal stomach from the lower mediastinum. The side hole is at the level of the distal thoracic esophagus (arrow). Negative left lung base. Elevated right hemidiaphragm again noted. IV contrast is being excreted from both kidneys, proximal ureters appear within normal limits. Stable bowel gas pattern. No osseous abnormality identified. IMPRESSION: Enteric tube coursing into the proximal stomach, side hole still the distal esophagus. Advance the tube 6 centimeters to ensure side hole placement within the stomach.  Electronically Signed   By: Genevie Ann M.D.   On: 07/16/2017 08:01    Anti-infectives: Anti-infectives (From admission, onward)   None       Assessment/Plan HTN - home meds GERD - protonix Asthma  SBO - multiple prior abdominal surgeries, 1 prior SBO about 1 month ago that resolved medically - CT scan today shows prominent, borderline dilated small bowel in the central, low mid abdomen with decompressed  distal ileum; mild inflammatory stranding surrounding a loop of small bowel in the right lower quadrant that may indicate partial obstruction - contrast in colon, +BM yesterday  ID - none VTE - SCDs, lovenox FEN - IVF, FLD Foley - none Follow up - TBD  Plan - Contrast in colon and patient having bowel movements. Ok to keep NG tube out. Advance to full liquids. Encourage ambulation. May be able to advance to soft diet later today and discharge this PM. Will recheck patient later today.   LOS: 0 days    Wellington [place] , [facility] 07/17/2017, 7:49 AM Pager: [phone] Consults: [phone] Mon 7:00 am -11:30 AM Tues-Fri 7:00 am-4:30 pm Sat-Sun 7:00 am-11:30 am

## 2017-07-17 NOTE — Discharge Summary (Signed)
[facility] Discharge Summary   Patient ID: Cynthia Peters MRN: [id number] DOB/AGE: 02-16-1950 67 y.o.  Admit date: 07/16/2017 Discharge date: 07/17/2017  Admitting Diagnosis: SBO  Discharge Diagnosis Patient Active Problem List   Diagnosis Date Noted  . History of transient ischemic attack (TIA) 07/16/2017  . GERD (gastroesophageal reflux disease) 07/16/2017  . Partial small bowel obstruction ([place]) 07/16/2017  . SBO (small bowel obstruction) ([place]) 05/29/2017  . Right radial head fracture 06/17/2016  . Hypertension 05/30/2015  . Hyperlipidemia 05/30/2015  . Recurrent ventral incisional hernia s/p lap repairs 2013, 2016, 2017 10/25/2014  . Anxiety 10/20/2014  . Irritable bowel syndrome 10/20/2014  . Interstitial cystitis 03/15/2013  . Lichen planus 31/54/0086  . PVC's (premature ventricular contractions)     Consultants None  Imaging: Ct Abdomen Pelvis W Contrast  Result Date: 07/16/2017 CLINICAL DATA:  Abdominal pain EXAM: CT ABDOMEN AND PELVIS WITH CONTRAST TECHNIQUE: Multidetector CT imaging of the abdomen and pelvis was performed using the standard protocol following bolus administration of intravenous contrast. CONTRAST:  179mL ISOVUE-300 IOPAMIDOL (ISOVUE-300) INJECTION 61% COMPARISON:  None. FINDINGS: LOWER CHEST: No basilar pulmonary nodules or pleural effusion. No apical pericardial effusion. HEPATOBILIARY: Unchanged 2.5 cm hepatic cyst. Smaller hypodense foci are also unchanged. Normal gallbladder. PANCREAS: Normal parenchymal contours without ductal dilatation. No peripancreatic fluid collection. SPLEEN: Normal. ADRENALS/URINARY TRACT: --Adrenal glands: Normal. --Right kidney/ureter: No hydronephrosis, nephroureterolithiasis, perinephric stranding or solid renal mass. --Left kidney/ureter: No hydronephrosis, nephroureterolithiasis, perinephric stranding or solid renal mass. --Urinary bladder: Normal for degree of distention STOMACH/BOWEL:  --Stomach/Duodenum: No hiatal hernia or other gastric abnormality. Normal duodenal course. --Small bowel: Multiple loops of prominent, but not pathologically dilated, small bowel in the central low mid abdomen with decompression of the distal ileum. Caliber transition occurs in the right lower quadrant. There is mild stranding surrounding a loop in the right lower quadrant (coronal image 39, axial image 62). --Colon: No focal abnormality. --Appendix: Normal. VASCULAR/LYMPHATIC: Atherosclerotic calcification is present within the non-aneurysmal abdominal aorta, without hemodynamically significant stenosis. The portal vein, splenic vein, superior mesenteric vein and IVC are patent. No abdominal or pelvic lymphadenopathy. REPRODUCTIVE: Normal uterus and ovaries. MUSCULOSKELETAL. Grade 1 anterolisthesis at L4-L5 secondary to facet hypertrophy. OTHER: None. IMPRESSION: 1. Prominent, borderline dilated small bowel in the central, low mid abdomen with decompressed distal ileum. Mild inflammatory stranding surrounding a loop of small bowel in the right lower quadrant. This may indicate partial obstruction or adynamic ileus secondary to active inflammation. The appearance is very similar to the 05/29/2017 study, but the location of the inflammatory changes different. 2.  Aortic Atherosclerosis (ICD10-I70.0). 3. Grade 1 L4-5 anterolisthesis secondary to facet arthrosis. Electronically Signed   By: Ulyses Jarred M.D.   On: 07/16/2017 04:46   Dg Abd Portable 1v  Result Date: 07/17/2017 CLINICAL DATA:  NG tube placement EXAM: PORTABLE ABDOMEN - 1 VIEW COMPARISON:  07/17/2017 FINDINGS: The NG tube tip and side port are in unchanged position near the gastroesophageal junction. Advancement by 7 cm recommended. IMPRESSION: Unchanged NG tube position.  Recommend advancing by 7 cm. Electronically Signed   By: Ulyses Jarred M.D.   On: 07/17/2017 04:45   Dg Abd Portable 1v  Result Date: 07/17/2017 CLINICAL DATA:  NG tube  position. EXAM: PORTABLE ABDOMEN - 1 VIEW COMPARISON:  Abdominal radiograph yesterday at 1519 hour FINDINGS: The enteric tube is been retracted with side-port now in the distal esophagus. Tip is in the proximal stomach. Advancement of least 7 cm to place the side-port below  the diaphragm. Enteric contrast seen throughout the colon. IMPRESSION: Retracted enteric tube with side port in the distal esophagus. Advancement of least 7 cm would place the side-port below the diaphragm. Electronically Signed   By: Jeb Levering M.D.   On: 07/17/2017 01:18   Dg Abd Portable 1v  Result Date: 07/16/2017 CLINICAL DATA:  Nasogastric tube repositioning. Oral contrast administered earlier in the day EXAM: PORTABLE ABDOMEN - 1 VIEW COMPARISON:  Study obtained earlier in the day FINDINGS: Nasogastric tube tip and side port are in the stomach. The side port is slightly inferior to the gastroesophageal junction, likely in the gastric cardia. Contrast is seen in the distal small bowel and throughout much of the large bowel. There is no bowel dilatation or air-fluid level to suggest bowel obstruction. No evident free air. Visualized lung bases are clear. IMPRESSION: Oral contrast is seen in the distal small bowel as well as throughout much of the large bowel. No bowel dilatation or air-fluid level. No free air. Nasogastric tube tip and side port in stomach. Lung bases clear. Electronically Signed   By: Lowella Grip III M.D.   On: 07/16/2017 15:55   Dg Abd Portable 1v-small Bowel Protocol-position Verification  Result Date: 07/16/2017 CLINICAL DATA:  67 year old female NG tube placement. EXAM: PORTABLE ABDOMEN - 1 VIEW COMPARISON:  CT Abdomen and Pelvis 0423 hours today. FINDINGS: Portable AP upright view at 0656 hours. An enteric tube courses into the proximal stomach from the lower mediastinum. The side hole is at the level of the distal thoracic esophagus (arrow). Negative left lung base. Elevated right hemidiaphragm  again noted. IV contrast is being excreted from both kidneys, proximal ureters appear within normal limits. Stable bowel gas pattern. No osseous abnormality identified. IMPRESSION: Enteric tube coursing into the proximal stomach, side hole still the distal esophagus. Advance the tube 6 centimeters to ensure side hole placement within the stomach. Electronically Signed   By: Genevie Ann M.D.   On: 07/16/2017 08:01    Procedures None  Hospital Course:  Cynthia Peters is a 68yo female with prior history of multiple abdominal surgeries and a previous SBO 1 month ago that resolved without surgical intervention, who presented to [facility] 6/13 with 1 day of worsening abdominal pain.  Workup included CT scan that showed partial SBO.  Patient was admitted for bowel rest. NG tube was placed for decompression and patient started on the small bowel protocol. Follow up xrays revealed contrast in the colon and resolution of SBO.  Diet was advanced as tolerated.  On 6/14 the patient was having bowel function, tolerating diet, ambulating well, pain well controlled, vital signs stable and felt stable for discharge home.  Patient will follow up as below and knows to call with questions or concerns.      Allergies as of 07/17/2017      Reactions   Hydrocodone Rash   Hydrocodone-acetaminophen Rash   Tramadol Palpitations      Medication List    STOP taking these medications   pantoprazole 20 MG tablet Commonly known as:  PROTONIX     TAKE these medications   acetaminophen 500 MG tablet Commonly known as:  TYLENOL Take 1,000 mg by mouth every 6 (six) hours as needed (pain).   albuterol 108 (90 Base) MCG/ACT inhaler Commonly known as:  PROVENTIL HFA;VENTOLIN HFA Inhale 2 puffs into the lungs every 6 (six) hours as needed for wheezing or shortness of breath.   ALIGN PO Take 1 capsule by mouth 2 (  two) times daily.   ALLEGRA PO Take 1 tablet by mouth daily as needed (ALLERGIES).   ALPRAZolam 0.25 MG  tablet Commonly known as:  XANAX Take I pill every 6 hours as needed for anxiety What changed:    how much to take  how to take this  when to take this  reasons to take this  additional instructions   aspirin 81 MG chewable tablet Chew 81 mg by mouth daily.   buPROPion 300 MG 24 hr tablet Commonly known as:  WELLBUTRIN XL Take 300 mg by mouth daily.   clobetasol cream 0.05 % Commonly known as:  TEMOVATE Apply 1 application topically 2 (two) times daily as needed (rash).   Co Q 10 100 MG Caps Take 100 mg by mouth daily.   [facility] VITAMIN C 1000 MG tablet Generic drug:  ascorbic acid Take 1,000 mg by mouth daily.   cycloSPORINE 0.05 % ophthalmic emulsion Commonly known as:  RESTASIS Place 1 drop into both eyes 2 (two) times daily.   dicyclomine 10 MG capsule Commonly known as:  BENTYL Take 10 mg by mouth every 4 (four) hours as needed (for abdominal cramps).   Evening Primrose Oil 1000 MG Caps Take 1,000 mg by mouth 2 (two) times daily.   Flaxseed (Linseed) 1000 MG Caps Take 1,000 mg by mouth 2 (two) times daily.   fluticasone 50 MCG/ACT nasal spray Commonly known as:  FLONASE Place 2 sprays into both nostrils daily as needed for allergies or rhinitis.   hydrochlorothiazide 25 MG tablet Commonly known as:  HYDRODIURIL Take 25 mg by mouth daily.   HYDROCORTISONE ACE (RECTAL) 30 MG Supp Place 30 mg rectally 2 (two) times daily as needed for hemorrhoids.   ibuprofen 200 MG tablet Commonly known as:  ADVIL,MOTRIN Take 400-600 mg by mouth 2 (two) times daily as needed (pain).   lidocaine 5 % ointment Commonly known as:  XYLOCAINE Apply 1 application topically 3 (three) times daily as needed for mild pain or moderate pain.   losartan 100 MG tablet Commonly known as:  COZAAR TAKE 1 TABLET BY MOUTH EVERY DAY   MAXITROL 0.1 % ophthalmic suspension Generic drug:  neomycin-polymyxin-dexamethasone Place 1 drop into both eyes 4 (four) times daily.   meclizine 25  MG tablet Commonly known as:  ANTIVERT Take 1 tablet (25 mg total) by mouth 3 (three) times daily as needed for dizziness.   metoprolol succinate 100 MG 24 hr tablet Commonly known as:  TOPROL-XL Take 1 tablet in the morning and 1/2 tablet (50 mg) in the evening. Take with or immediately following a meal.   montelukast 10 MG tablet Commonly known as:  SINGULAIR Take 10 mg by mouth daily.   multivitamin with minerals Tabs tablet Take 1 tablet by mouth daily.   nystatin 100000 UNIT/ML suspension Commonly known as:  MYCOSTATIN Take 5 mLs by mouth daily as needed (FLARES, LICHEN PLANTIS).   nystatin cream Commonly known as:  MYCOSTATIN Apply 1 application topically 2 (two) times daily as needed for dry skin.   omeprazole 20 MG capsule Commonly known as:  PRILOSEC Take 20 mg by mouth daily.   ondansetron 4 MG tablet Commonly known as:  ZOFRAN Take 1 tablet (4 mg total) by mouth every 8 (eight) hours as needed for up to 10 doses for nausea or vomiting.   PAZEO 0.7 % Soln Generic drug:  Olopatadine HCl Place 1 drop into both eyes daily as needed for allergies.   phenazopyridine 200 MG tablet  Commonly known as:  PYRIDIUM Take 200 mg by mouth 3 (three) times daily as needed for pain [place] TRACT PAIN).   potassium chloride SA 20 MEQ tablet Commonly known as:  KLOR-CON M20 Take 1.5 tablets (30 mEq total) by mouth daily.   PREVIDENT 5000 DRY MOUTH 1.1 % Gel dental gel Generic drug:  sodium fluoride USE ONCE DAILY IN PLACE OF REGULAR TOOTHEPASTE   psyllium 28 % packet Commonly known as:  METAMUCIL SMOOTH TEXTURE Take 1 packet by mouth daily. Start taking on:  07/20/2017   simvastatin 40 MG tablet Commonly known as:  ZOCOR Take 40 mg by mouth daily.   valACYclovir 500 MG tablet Commonly known as:  VALTREX INCREASE TO TAKING 1 TABLET 2 TIMES A DAY FOR 3 DAYS WITH OUTBREAK What changed:  See the new instructions.   Vitamin D 2000 units Caps Take 2,000 Units by mouth  daily.   zolpidem 10 MG tablet Commonly known as:  AMBIEN Take 10 mg by mouth at bedtime as needed for sleep.        Follow-up [facility], [place]. Call.   Specialty:  General Surgery Why:  as needed, you do not have to schedule an appointment Contact information: [address] [place] [phone]          Signed: Wellington Hampshire, [facility] 07/17/2017, 3:16 PM Pager: [phone] Consults: [phone] Mon 7:00 am -11:30 AM Tues-Fri 7:00 am-4:30 pm Sat-Sun 7:00 am-11:30 am

## 2017-07-17 NOTE — Progress Notes (Signed)
Pt pulled out her ng tube herself.  MD notified.  07/17/2017 6:17 AM Nedra Hai, RN

## 2017-08-04 ENCOUNTER — Ambulatory Visit: Payer: Medicare Other

## 2017-08-14 ENCOUNTER — Encounter: Payer: Self-pay | Admitting: Cardiology

## 2017-08-21 ENCOUNTER — Telehealth: Payer: Self-pay | Admitting: Physician Assistant

## 2017-08-21 ENCOUNTER — Ambulatory Visit: Payer: Medicare Other

## 2017-08-21 ENCOUNTER — Encounter: Payer: Self-pay | Admitting: Physician Assistant

## 2017-08-21 ENCOUNTER — Ambulatory Visit: Payer: Medicare Other | Admitting: Physician Assistant

## 2017-08-21 VITALS — BP 132/84 | HR 64 | Ht 63.0 in | Wt 163.2 lb

## 2017-08-21 DIAGNOSIS — E785 Hyperlipidemia, unspecified: Secondary | ICD-10-CM

## 2017-08-21 DIAGNOSIS — I10 Essential (primary) hypertension: Secondary | ICD-10-CM | POA: Diagnosis not present

## 2017-08-21 DIAGNOSIS — I712 Thoracic aortic aneurysm, without rupture, unspecified: Secondary | ICD-10-CM

## 2017-08-21 DIAGNOSIS — I493 Ventricular premature depolarization: Secondary | ICD-10-CM

## 2017-08-21 MED ORDER — AMLODIPINE BESYLATE 2.5 MG PO TABS: 2.5000 mg | ORAL_TABLET | Freq: Every day | ORAL | 3 refills | Status: DC

## 2017-08-21 NOTE — Telephone Encounter (Signed)
New Message      Juliann Pulse with [facility] is calling for a level 1 drug interaction: Simvastatin & Amlodipine, would like to know if the patient is suppose to stay with the drug or not? Pls advise.

## 2017-08-21 NOTE — Progress Notes (Signed)
Cardiology Office Note    Date:  08/23/2017   ID:  BILLI BRIGHT, DOB 05-Apr-1950, MRN [id number]  PCP:  Aletha Halim., PA-C  Cardiologist:  Dr. Sabino Snipes, PA-C  Chief Complaint  Patient presents with  . Follow-up    history of PVC's. x1 week. notices them more then eating    History of Present Illness:  SAYDA GRABLE is a 67 y.o. female with past medical history of hypertension, hyperlipidemia, palpitation, GERD and thoracic aortic aneurysm.  Patient had a event monitor placed in 2007 that demonstrated rare PVCs.  Echocardiogram in 2010 was normal as well.  She has been followed by Dr. Martinique for palpitations since July 2012.  She was admitted in April 2013 with TIA.  Echocardiogram obtained at the time showed mild LVH, grade 1 DD, EF 65 to 70%, mild AI.  Carotid ultrasound in April 2013 showed no significant ICA stenosis.  It does not appears patient has been seen by Dr. Martinique since 2012.  She was last seen by Ellen Henri PA-C at which point she was doing well at the time.  Repeat echocardiogram obtained on 09/11/2016 showed EF 60 to 65%, grade 1 DD, mild AI, dilated ascending aorta at 40 mm, and mild MR.  Since April of this year, patient has been admitted multiple times for small bowel obstruction and abdominal pain.  The last admission was in June 2019.  Work-up included a CT scan that showed partial small bowel obstruction.  Patient was recently seen in the urgent care on 08/14/2017 with increased episode of PVCs.  Patient presents today for cardiology office visit.  Her abdominal issue has improved.  2 weeks ago, patient started having recurrent PVCs.  She does not drink caffeine.  She has been under a lot of stress.  Instead of 100 mg a.m. the 50 mg p.m. of metoprolol, she has self increased her Toprol-XL to 100 mg twice daily.  This seems to control her heart rate a little better.  In the past 2 days, she only had 2 episodes of palpitation.  I will  continue her on the current medication.  Her blood pressure is mildly elevated, I added a very low-dose amlodipine 2.5 mg daily to her medical regimen.  Otherwise she does not have any lower extremity edema, orthopnea or PND.   Past Medical History:  Diagnosis Date  . Anemia    history in the past, not on a regular basis  . Arthritis   . Bronchitis   . Cataracts, bilateral    immature  . Claustrophobia    takes Valium daily as needed  . Complication of anesthesia    2013 SURGERY vocal cord bothering pt, used smaller ent tube after 2 hernia repair, no problem after . " I woke up during my MRI and colonoscopy."       . Depression    takes Wellbutrin daily  . Dry eye   . Dry mouth   . GERD (gastroesophageal reflux disease)    takes Omeprazole daily   . H/O hiatal hernia   . Headache    migraines with flashing lights   . History of blood clots 40 yrs    leg   . History of echocardiogram 2013   a. Echo (05/2011): Mild LVH, EF 65-70%, no WMA, Gr 1 DD, mild AI.  Marland Kitchen History of MRSA infection   . History of staph infection   . History of stress test 2010   a. ETT-Echo in  2010 was normal  . History of TIA (transient ischemic attack) 2013   a. Carotid US (05/2011): No ICA stenosis, pt unsure of this  . Hyperlipidemia    takes Simvastatin daily   . Hypertension    takes  Losartan daily  . IBS (irritable bowel syndrome)    takes Bentyl daily as needed  . Insomnia    takes Ambien nightly  . Interstitial cystitis   . Lichen planus   . Mild asthma    Albuterol inhaler as needed  . Palpitations    Event monitor in 2007 with rare PVCs // takes Metoprolol daily  . PVC (premature ventricular contraction)   . Seasonal allergies    takes Claritin daily as needed.Takes Singulair daily as needed.  Marland Kitchen TIA (transient ischemic attack) 05/27/2011  . Urethral stricture   . Vertigo    takes Meclizine daily as needed    Past Surgical History:  Procedure Laterality Date  . CESAREAN SECTION      . COLONOSCOPY WITH PROPOFOL N/A 08/10/2012   Procedure: COLONOSCOPY WITH PROPOFOL;  Surgeon: Garlan Fair, MD;  Location: [facility];  Service: Endoscopy;  Laterality: N/A;  . CYSTOSCOPY WITH URETHRAL DILATATION N/A 02/18/2013   Procedure: CYSTOSCOPY WITH URETHRAL DILATATION ( NO BALLOON) AND HYDRODISTENSION;  Surgeon: Alexis Frock, MD;  Location: [facility];  Service: Urology;  Laterality: N/A;  . ENDOMETRIAL ABLATION W/ HYDROTHERMABLATOR  09-27-2002  . INCISIONAL HERNIA REPAIR N/A 10/25/2014   Procedure: HERNIA REPAIR INCISIONAL;  Surgeon: Ralene Ok, MD;  Location: [facility];  Service: General;  Laterality: N/A;  . INCISIONAL HERNIA REPAIR N/A 03/26/2015   Procedure: LAPAROSCOPIC INCISIONAL HERNIA;  Surgeon: Ralene Ok, MD;  Location: [facility];  Service: General;  Laterality: N/A;  . INSERTION OF MESH N/A 10/25/2014   Procedure: INSERTION OF MESH;  Surgeon: Ralene Ok, MD;  Location: [facility];  Service: General;  Laterality: N/A;  . LAPAROSCOPIC LYSIS OF ADHESIONS N/A 03/26/2015   Procedure: LAPAROSCOPIC LYSIS OF ADHESIONS;  Surgeon: Ralene Ok, MD;  Location: [place];  Service: General;  Laterality: N/A;  . LAPAROTOMY N/A 10/25/2014   Procedure: EXPLORATORY LAPAROTOMY;  Surgeon: Ralene Ok, MD;  Location: [place];  Service: General;  Laterality: N/A;  . LYSIS OF ADHESION N/A 10/25/2014   Procedure: LYSIS OF ADHESIONS ;  Surgeon: Ralene Ok, MD;  Location: [facility];  Service: General;  Laterality: N/A;  . MUCOSAL ADVANCEMENT FLAP N/A 10/25/2014   Procedure: MUCOSAL ADVANCEMENT FLAP;  Surgeon: Ralene Ok, MD;  Location: [place];  Service: General;  Laterality: N/A;  . ORIF RADIAL FRACTURE Right 06/17/2016   Procedure: right radial head arthroplasty with ligament repair, reconstruciton;  Surgeon: Roseanne Kaufman, MD;  Location: [facility];  Service: Orthopedics;  Laterality: Right;  . RADIAL HEAD ARTHROPLASTY Right 06/17/2016  . TOOTH EXTRACTION    . TRANSTHORACIC  ECHOCARDIOGRAM  05-26-2011   MILD LVH/  EF 62-37%/  GRADE I DIASTOLIC DYSFUNCTION/  MILD AR//   06-20-2008  NOMRAL STRESS ECHO  . UMBILICAL HERNIA REPAIR  2013   AND REVISION THE SAME YEAR W/ MESH  . WISDOM TOOTH EXTRACTION  AGE 61    Current Medications: Outpatient Medications Prior to Visit  Medication Sig Dispense Refill  . acetaminophen (TYLENOL) 500 MG tablet Take 1,000 mg by mouth every 6 (six) hours as needed (pain).     Marland Kitchen albuterol (PROVENTIL HFA;VENTOLIN HFA) 108 (90 BASE) MCG/ACT inhaler Inhale 2 puffs into the lungs every 6 (six) hours as needed for  wheezing or shortness of breath.    . ALPRAZolam (XANAX) 0.25 MG tablet Take I pill every 6 hours as needed for anxiety (Patient taking differently: Take 0.25 mg by mouth every 6 (six) hours as needed for anxiety. Take I pill every 6 hours as needed for anxiety) 30 tablet 0  . aspirin 81 MG chewable tablet Chew 81 mg by mouth daily.    Marland Kitchen azelastine (OPTIVAR) 0.05 % ophthalmic solution INSTILL 1 DROP INTO AFFECTED EYE TWICE A DAY  0  . buPROPion (WELLBUTRIN XL) 300 MG 24 hr tablet Take 300 mg by mouth daily.     . Cholecalciferol (VITAMIN D) 2000 units CAPS Take 2,000 Units by mouth daily.    . clobetasol cream (TEMOVATE) 4.09 % Apply 1 application topically 2 (two) times daily as needed (rash).   7  . Coenzyme Q10 (CO Q 10) 100 MG CAPS Take 100 mg by mouth daily.     . [facility] VITAMIN C 1000 MG tablet Take 1,000 mg by mouth daily.   1  . cycloSPORINE (RESTASIS) 0.05 % ophthalmic emulsion Place 1 drop into both eyes 2 (two) times daily.    Marland Kitchen dicyclomine (BENTYL) 10 MG capsule Take 10 mg by mouth every 4 (four) hours as needed (for abdominal cramps).   1  . Evening Primrose Oil 1000 MG CAPS Take 1,000 mg by mouth 2 (two) times daily.     Marland Kitchen Fexofenadine HCl (ALLEGRA PO) Take 1 tablet by mouth daily as needed (ALLERGIES).    . Flaxseed, Linseed, 1000 MG CAPS Take 1,000 mg by mouth 2 (two) times daily.     . fluticasone (FLONASE) 50 MCG/ACT  nasal spray Place 2 sprays into both nostrils daily as needed for allergies or rhinitis.     . hydrochlorothiazide 25 MG tablet Take 25 mg by mouth daily.     Marland Kitchen HYDROCORTISONE ACE, RECTAL, 30 MG SUPP Place 30 mg rectally 2 (two) times daily as needed for hemorrhoids.  5  . ibuprofen (ADVIL,MOTRIN) 200 MG tablet Take 400-600 mg by mouth 2 (two) times daily as needed (pain).     Marland Kitchen lidocaine (XYLOCAINE) 5 % ointment Apply 1 application topically 3 (three) times daily as needed for mild pain or moderate pain.    Marland Kitchen losartan (COZAAR) 100 MG tablet TAKE 1 TABLET BY MOUTH EVERY DAY 30 tablet 11  . meclizine (ANTIVERT) 25 MG tablet Take 1 tablet (25 mg total) by mouth 3 (three) times daily as needed for dizziness. 30 tablet 0  . metoprolol succinate (TOPROL-XL) 100 MG 24 hr tablet Take 100 mg by mouth 2 (two) times daily. Take with or immediately following a meal.    . montelukast (SINGULAIR) 10 MG tablet Take 10 mg by mouth daily.     . Multiple Vitamin (MULTIVITAMIN WITH MINERALS) TABS Take 1 tablet by mouth daily.    Marland Kitchen neomycin-polymyxin-dexamethasone (MAXITROL) 0.1 % ophthalmic suspension Place 1 drop into both eyes 4 (four) times daily.    Marland Kitchen nystatin (MYCOSTATIN) 100000 UNIT/ML suspension Take 5 mLs by mouth daily as needed (FLARES, LICHEN PLANTIS).    Marland Kitchen nystatin cream (MYCOSTATIN) Apply 1 application topically 2 (two) times daily as needed for dry skin.    Marland Kitchen omeprazole (PRILOSEC) 20 MG capsule Take 20 mg by mouth daily.    . ondansetron (ZOFRAN) 4 MG tablet Take 1 tablet (4 mg total) by mouth every 8 (eight) hours as needed for up to 10 doses for nausea or vomiting. 10 tablet 0  .  PAZEO 0.7 % SOLN Place 1 drop into both eyes daily as needed for allergies.  4  . phenazopyridine (PYRIDIUM) 200 MG tablet Take 200 mg by mouth 3 (three) times daily as needed for pain [place] TRACT PAIN).    Marland Kitchen potassium chloride SA (KLOR-CON M20) 20 MEQ tablet Take 1.5 tablets (30 mEq total) by mouth daily. 135 tablet 3    . PREVIDENT 5000 DRY MOUTH 1.1 % GEL dental gel USE ONCE DAILY IN PLACE OF REGULAR TOOTHEPASTE  2  . Probiotic Product (ALIGN PO) Take 1 capsule by mouth 2 (two) times daily.     . psyllium (METAMUCIL SMOOTH TEXTURE) 28 % packet Take 1 packet by mouth daily.    . simvastatin (ZOCOR) 40 MG tablet Take 40 mg by mouth daily.     . valACYclovir (VALTREX) 500 MG tablet INCREASE TO TAKING 1 TABLET 2 TIMES A DAY FOR 3 DAYS WITH OUTBREAK (Patient taking differently: TAKE 1 TABLET BY MOUTH DAILY) 30 tablet 0  . zolpidem (AMBIEN) 10 MG tablet Take 10 mg by mouth at bedtime as needed for sleep.    . metoprolol succinate (TOPROL-XL) 100 MG 24 hr tablet Take 1 tablet in the morning and 1/2 tablet (50 mg) in the evening. Take with or immediately following a meal. (Patient taking differently: Take 1 tablet in the morning and 1 (50 mg) in the evening. Take with or immediately following a meal.) 135 tablet 3   No facility-administered medications prior to visit.      Allergies:   Hydrocodone; Hydrocodone-acetaminophen; and Tramadol   Social History   Socioeconomic History  . Marital status: Divorced    Spouse name: Not on file  . Number of children: 2  . Years of education: Not on file  . Highest education level: Not on file  Occupational History  . Occupation: Pharmacist, hospital  Social Needs  . Financial resource strain: Not on file  . Food insecurity:    Worry: Not on file    Inability: Not on file  . Transportation needs:    Medical: Not on file    Non-medical: Not on file  Tobacco Use  . Smoking status: Former Smoker    Packs/day: 0.25    Years: 2.00    Pack years: 0.50    Types: Cigarettes    Last attempt to quit: 02/04/1976    Years since quitting: 41.5  . Smokeless tobacco: Never Used  Substance and Sexual Activity  . Alcohol use: Yes    Comment: occasional  . Drug use: No  . Sexual activity: Not on file  Lifestyle  . Physical activity:    Days per week: Not on file    Minutes per  session: Not on file  . Stress: Not on file  Relationships  . Social connections:    Talks on phone: Not on file    Gets together: Not on file    Attends religious service: Not on file    Active member of club or organization: Not on file    Attends meetings of clubs or organizations: Not on file    Relationship status: Not on file  Other Topics Concern  . Not on file  Social History Narrative  . Not on file     Family History:  The patient's family history includes Cirrhosis in her father; Dementia in her mother; Leukemia in her child.   ROS:   Please see the history of present illness.    ROS All other systems reviewed  and are negative.   PHYSICAL EXAM:   VS:  BP 132/84 (BP Location: Left Arm, Patient Position: Sitting)   Pulse 64   Ht 5\' 3"  (1.6 m)   Wt 163 lb 3.2 oz (74 kg)   SpO2 98%   BMI 28.91 kg/m    GEN: Well nourished, well developed, in no acute distress  HEENT: normal  Neck: no JVD, carotid bruits, or masses Cardiac: RRR; no murmurs, rubs, or gallops,no edema  Respiratory:  clear to auscultation bilaterally, normal work of breathing GI: soft, nontender, nondistended, + BS MS: no deformity or atrophy  Skin: warm and dry, no rash Neuro:  Alert and Oriented x 3, Strength and sensation are intact Psych: euthymic mood, full affect  Wt Readings from Last 3 Encounters:  08/21/17 163 lb 3.2 oz (74 kg)  07/16/17 169 lb (76.7 kg)  05/28/17 169 lb 15.6 oz (77.1 kg)      Studies/Labs Reviewed:   EKG:  EKG is not ordered today.    Recent Labs: 07/16/2017: ALT 31; Hemoglobin 14.3; Magnesium 2.1; Platelets 184 07/17/2017: BUN 10; Creatinine, Ser 0.76; Potassium 3.7; Sodium 139   Lipid Panel    Component Value Date/Time   CHOL 192 05/26/2011 1245   TRIG 122 05/26/2011 1245   HDL 65 05/26/2011 1245   CHOLHDL 3.0 05/26/2011 1245   VLDL 24 05/26/2011 1245   LDLCALC 103 (H) 05/26/2011 1245    Additional studies/ records that were reviewed today include:   Echo 09/11/2016 LV EF: 60% -   65% Study Conclusions  - Left ventricle: The cavity size was normal. Systolic function was   normal. The estimated ejection fraction was in the range of 60%   to 65%. Wall motion was normal; there were no regional wall   motion abnormalities. There was an increased relative   contribution of atrial contraction to ventricular filling.   Doppler parameters are consistent with abnormal left ventricular   relaxation (grade 1 diastolic dysfunction). - Aortic valve: Trileaflet; normal thickness, mildly calcified   leaflets. There was mild regurgitation directed towards the   mitral anterior leaflet. Regurgitation pressure half-time: 520   ms. - Aorta: Ascending aorta diameter: 40 mm (ED). - Ascending aorta: The ascending aorta was mildly dilated. - Mitral valve: There was mild regurgitation. - Pulmonic valve: There was mild regurgitation.   ASSESSMENT:    1. PVC's (premature ventricular contractions)   2. Essential hypertension   3. Hyperlipidemia, unspecified hyperlipidemia type   4. Thoracic aortic aneurysm without rupture ([place])      PLAN:  In order of problems listed above:  1. PVCs: She has self increased her metoprolol to 100 mg twice daily.  I am unable to increase metoprolol any further due to baseline bradycardia.  She has been instructed to take additional 50 mg on a as needed basis if she does have increased palpitation.  2. Hypertension: Blood pressure mildly elevated, add amlodipine 2.5 mg daily.  3. Hyperlipidemia: Zocor 40 mg daily.  Annual lipid panel followed by primary care provider.  4. Thoracic aortic aneurysm: 40 mm ascending aorta on previous echocardiogram in 2018.  Consider CTA of the chest on next follow-up to reassess thoracic aortic aneurysm.    Medication Adjustments/Labs and Tests Ordered: Current medicines are reviewed at length with the patient today.  Concerns regarding medicines are outlined above.  Medication  changes, Labs and Tests ordered today are listed in the Patient Instructions below. Patient Instructions  AM Blood Pressure Medications: Metoprolol, Amlodipine, Hydrochlorothiazide  PM Blood Pressure Medications: Metoprolol, Losartan  Check Blood Pressure twice a day: AM blood pressure 2 hours after morning meds, PM blood pressure at a fixed time daily.  Isaac Laud PA has recommended that you follow up with Dr. Martinique in 6 months. You will get a notice in MyChart when it is time to call and schedule an appointment    Signed, Almyra Deforest, [place]  08/23/2017 11:24 PM    [address], [place], [place]  [address] Phone: [phone]; Fax: [phone]

## 2017-08-21 NOTE — Telephone Encounter (Signed)
Called [facility]. Notified them that amlodipine was Rx'ed under care of cardiology provider and simvastatin is on med list.

## 2017-08-21 NOTE — Patient Instructions (Addendum)
AM Blood Pressure Medications: Metoprolol, Amlodipine, Hydrochlorothiazide  PM Blood Pressure Medications: Metoprolol, Losartan  Check Blood Pressure twice a day: AM blood pressure 2 hours after morning meds, PM blood pressure at a fixed time daily.  Isaac Laud PA has recommended that you follow up with Dr. Martinique in 6 months. You will get a notice in MyChart when it is time to call and schedule an appointment

## 2017-08-23 ENCOUNTER — Encounter: Payer: Self-pay | Admitting: Physician Assistant

## 2017-09-03 ENCOUNTER — Other Ambulatory Visit: Payer: Self-pay | Admitting: Cardiology

## 2017-09-03 DIAGNOSIS — I493 Ventricular premature depolarization: Secondary | ICD-10-CM

## 2017-09-03 NOTE — Telephone Encounter (Signed)
Rx sent to pharmacy   

## 2017-09-12 ENCOUNTER — Other Ambulatory Visit: Payer: Self-pay | Admitting: Cardiology

## 2017-09-17 DIAGNOSIS — T63481A Toxic effect of venom of other arthropod, accidental (unintentional), initial encounter: Secondary | ICD-10-CM | POA: Insufficient documentation

## 2017-10-16 ENCOUNTER — Other Ambulatory Visit: Payer: Self-pay | Admitting: Cardiology

## 2017-11-16 ENCOUNTER — Ambulatory Visit: Payer: Medicare Other | Admitting: Physician Assistant

## 2017-11-16 DIAGNOSIS — R0989 Other specified symptoms and signs involving the circulatory and respiratory systems: Secondary | ICD-10-CM

## 2017-11-16 NOTE — Progress Notes (Deleted)
Cardiology Office Note    Date:  11/16/2017   ID:  ABIR EROH, DOB 02-Apr-1950, MRN [id number]  PCP:  Aletha Halim., PA-C  Cardiologist:  Dr. Sabino Snipes, PA-C   No chief complaint on file.   History of Present Illness:  Cynthia Peters is a 67 y.o. female with past medical history of hypertension, hyperlipidemia, palpitation, GERD and thoracic aortic aneurysm.  Patient had a event monitor placed in 2007 that demonstrated rare PVCs.  Echocardiogram in 2010 was normal as well.  She has been followed by Dr. Martinique for palpitations since July 2012.  She was admitted in April 2013 with TIA.  Echocardiogram obtained at the time showed mild LVH, grade 1 DD, EF 65 to 70%, mild AI.  Carotid ultrasound in April 2013 showed no significant ICA stenosis.  It does not appears patient has been seen by Dr. Martinique since 2012.  She was last seen by Ellen Henri PA-C at which point she was doing well at the time.  Repeat echocardiogram obtained on 09/11/2016 showed EF 60 to 65%, grade 1 DD, mild AI, dilated ascending aorta at 40 mm, and mild MR.   No EKG   Past Medical History:  Diagnosis Date  . Anemia    history in the past, not on a regular basis  . Arthritis   . Bronchitis   . Cataracts, bilateral    immature  . Claustrophobia    takes Valium daily as needed  . Complication of anesthesia    2013 SURGERY vocal cord bothering pt, used smaller ent tube after 2 hernia repair, no problem after . " I woke up during my MRI and colonoscopy."       . Depression    takes Wellbutrin daily  . Dry eye   . Dry mouth   . GERD (gastroesophageal reflux disease)    takes Omeprazole daily   . H/O hiatal hernia   . Headache    migraines with flashing lights   . History of blood clots 40 yrs    leg   . History of echocardiogram 2013   a. Echo (05/2011): Mild LVH, EF 65-70%, no WMA, Gr 1 DD, mild AI.  Marland Kitchen History of MRSA infection   . History of staph infection   . History of  stress test 2010   a. ETT-Echo in 2010 was normal  . History of TIA (transient ischemic attack) 2013   a. Carotid US (05/2011): No ICA stenosis, pt unsure of this  . Hyperlipidemia    takes Simvastatin daily   . Hypertension    takes  Losartan daily  . IBS (irritable bowel syndrome)    takes Bentyl daily as needed  . Insomnia    takes Ambien nightly  . Interstitial cystitis   . Lichen planus   . Mild asthma    Albuterol inhaler as needed  . Palpitations    Event monitor in 2007 with rare PVCs // takes Metoprolol daily  . PVC (premature ventricular contraction)   . Seasonal allergies    takes Claritin daily as needed.Takes Singulair daily as needed.  Marland Kitchen TIA (transient ischemic attack) 05/27/2011  . Urethral stricture   . Vertigo    takes Meclizine daily as needed    Past Surgical History:  Procedure Laterality Date  . CESAREAN SECTION    . COLONOSCOPY WITH PROPOFOL N/A 08/10/2012   Procedure: COLONOSCOPY WITH PROPOFOL;  Surgeon: Garlan Fair, MD;  Location: [facility];  Service: Endoscopy;  Laterality: N/A;  . CYSTOSCOPY WITH URETHRAL DILATATION N/A 02/18/2013   Procedure: CYSTOSCOPY WITH URETHRAL DILATATION ( NO BALLOON) AND HYDRODISTENSION;  Surgeon: Alexis Frock, MD;  Location: [facility];  Service: Urology;  Laterality: N/A;  . ENDOMETRIAL ABLATION W/ HYDROTHERMABLATOR  09-27-2002  . INCISIONAL HERNIA REPAIR N/A 10/25/2014   Procedure: HERNIA REPAIR INCISIONAL;  Surgeon: Ralene Ok, MD;  Location: [place];  Service: General;  Laterality: N/A;  . INCISIONAL HERNIA REPAIR N/A 03/26/2015   Procedure: LAPAROSCOPIC INCISIONAL HERNIA;  Surgeon: Ralene Ok, MD;  Location: [place];  Service: General;  Laterality: N/A;  . INSERTION OF MESH N/A 10/25/2014   Procedure: INSERTION OF MESH;  Surgeon: Ralene Ok, MD;  Location: [place];  Service: General;  Laterality: N/A;  . LAPAROSCOPIC LYSIS OF ADHESIONS N/A 03/26/2015   Procedure: LAPAROSCOPIC LYSIS OF  ADHESIONS;  Surgeon: Ralene Ok, MD;  Location: [place];  Service: General;  Laterality: N/A;  . LAPAROTOMY N/A 10/25/2014   Procedure: EXPLORATORY LAPAROTOMY;  Surgeon: Ralene Ok, MD;  Location: [place];  Service: General;  Laterality: N/A;  . LYSIS OF ADHESION N/A 10/25/2014   Procedure: LYSIS OF ADHESIONS ;  Surgeon: Ralene Ok, MD;  Location: [place];  Service: General;  Laterality: N/A;  . MUCOSAL ADVANCEMENT FLAP N/A 10/25/2014   Procedure: MUCOSAL ADVANCEMENT FLAP;  Surgeon: Ralene Ok, MD;  Location: [place];  Service: General;  Laterality: N/A;  . ORIF RADIAL FRACTURE Right 06/17/2016   Procedure: right radial head arthroplasty with ligament repair, reconstruciton;  Surgeon: Roseanne Kaufman, MD;  Location: [place];  Service: Orthopedics;  Laterality: Right;  . RADIAL HEAD ARTHROPLASTY Right 06/17/2016  . TOOTH EXTRACTION    . TRANSTHORACIC ECHOCARDIOGRAM  05-26-2011   MILD LVH/  EF 40-10%/  GRADE I DIASTOLIC DYSFUNCTION/  MILD AR//   06-20-2008  NOMRAL STRESS ECHO  . UMBILICAL HERNIA REPAIR  2013   AND REVISION THE SAME YEAR W/ MESH  . WISDOM TOOTH EXTRACTION  AGE 63    Current Medications: Outpatient Medications Prior to Visit  Medication Sig Dispense Refill  . acetaminophen (TYLENOL) 500 MG tablet Take 1,000 mg by mouth every 6 (six) hours as needed (pain).     Marland Kitchen albuterol (PROVENTIL HFA;VENTOLIN HFA) 108 (90 BASE) MCG/ACT inhaler Inhale 2 puffs into the lungs every 6 (six) hours as needed for wheezing or shortness of breath.    . ALPRAZolam (XANAX) 0.25 MG tablet Take I pill every 6 hours as needed for anxiety (Patient taking differently: Take 0.25 mg by mouth every 6 (six) hours as needed for anxiety. Take I pill every 6 hours as needed for anxiety) 30 tablet 0  . amLODipine (NORVASC) 2.5 MG tablet Take 1 tablet (2.5 mg total) by mouth daily. 90 tablet 3  . aspirin 81 MG chewable tablet Chew 81 mg by mouth daily.    Marland Kitchen azelastine (OPTIVAR) 0.05 % ophthalmic solution  INSTILL 1 DROP INTO AFFECTED EYE TWICE A DAY  0  . buPROPion (WELLBUTRIN XL) 300 MG 24 hr tablet Take 300 mg by mouth daily.     . Cholecalciferol (VITAMIN D) 2000 units CAPS Take 2,000 Units by mouth daily.    . clobetasol cream (TEMOVATE) 2.72 % Apply 1 application topically 2 (two) times daily as needed (rash).   7  . Coenzyme Q10 (CO Q 10) 100 MG CAPS Take 100 mg by mouth daily.     . CVS VITAMIN C 1000 MG tablet Take 1,000 mg by mouth daily.  1  . cycloSPORINE (RESTASIS) 0.05 % ophthalmic emulsion Place 1 drop into both eyes 2 (two) times daily.    Marland Kitchen dicyclomine (BENTYL) 10 MG capsule Take 10 mg by mouth every 4 (four) hours as needed (for abdominal cramps).   1  . Evening Primrose Oil 1000 MG CAPS Take 1,000 mg by mouth 2 (two) times daily.     Marland Kitchen Fexofenadine HCl (ALLEGRA PO) Take 1 tablet by mouth daily as needed (ALLERGIES).    . Flaxseed, Linseed, 1000 MG CAPS Take 1,000 mg by mouth 2 (two) times daily.     . fluticasone (FLONASE) 50 MCG/ACT nasal spray Place 2 sprays into both nostrils daily as needed for allergies or rhinitis.     . hydrochlorothiazide 25 MG tablet Take 25 mg by mouth daily.     Marland Kitchen HYDROCORTISONE ACE, RECTAL, 30 MG SUPP Place 30 mg rectally 2 (two) times daily as needed for hemorrhoids.  5  . ibuprofen (ADVIL,MOTRIN) 200 MG tablet Take 400-600 mg by mouth 2 (two) times daily as needed (pain).     Marland Kitchen KLOR-CON M20 20 MEQ tablet TAKE 1&1/2 TABLETS BY MOUTH EVERY DAY 135 tablet 3  . lidocaine (XYLOCAINE) 5 % ointment Apply 1 application topically 3 (three) times daily as needed for mild pain or moderate pain.    Marland Kitchen losartan (COZAAR) 100 MG tablet TAKE 1 TABLET BY MOUTH EVERY DAY 90 tablet 3  . meclizine (ANTIVERT) 25 MG tablet Take 1 tablet (25 mg total) by mouth 3 (three) times daily as needed for dizziness. 30 tablet 0  . metoprolol succinate (TOPROL-XL) 100 MG 24 hr tablet Take 100 mg by mouth 2 (two) times daily. Take with or immediately following a meal.    . metoprolol  succinate (TOPROL-XL) 100 MG 24 hr tablet Take 1 tablet in the morning and 1 (50 mg) in the evening. Take with or immediately following a meal. 180 tablet 1  . montelukast (SINGULAIR) 10 MG tablet Take 10 mg by mouth daily.     . Multiple Vitamin (MULTIVITAMIN WITH MINERALS) TABS Take 1 tablet by mouth daily.    Marland Kitchen neomycin-polymyxin-dexamethasone (MAXITROL) 0.1 % ophthalmic suspension Place 1 drop into both eyes 4 (four) times daily.    Marland Kitchen nystatin (MYCOSTATIN) 100000 UNIT/ML suspension Take 5 mLs by mouth daily as needed (FLARES, LICHEN PLANTIS).    Marland Kitchen nystatin cream (MYCOSTATIN) Apply 1 application topically 2 (two) times daily as needed for dry skin.    Marland Kitchen omeprazole (PRILOSEC) 20 MG capsule Take 20 mg by mouth daily.    . ondansetron (ZOFRAN) 4 MG tablet Take 1 tablet (4 mg total) by mouth every 8 (eight) hours as needed for up to 10 doses for nausea or vomiting. 10 tablet 0  . PAZEO 0.7 % SOLN Place 1 drop into both eyes daily as needed for allergies.  4  . phenazopyridine (PYRIDIUM) 200 MG tablet Take 200 mg by mouth 3 (three) times daily as needed for pain [place] TRACT PAIN).    Marland Kitchen PREVIDENT 5000 DRY MOUTH 1.1 % GEL dental gel USE ONCE DAILY IN PLACE OF REGULAR TOOTHEPASTE  2  . Probiotic Product (ALIGN PO) Take 1 capsule by mouth 2 (two) times daily.     . psyllium (METAMUCIL SMOOTH TEXTURE) 28 % packet Take 1 packet by mouth daily.    . simvastatin (ZOCOR) 40 MG tablet Take 40 mg by mouth daily.     . valACYclovir (VALTREX) 500 MG tablet INCREASE TO TAKING 1 TABLET 2  TIMES A DAY FOR 3 DAYS WITH OUTBREAK (Patient taking differently: TAKE 1 TABLET BY MOUTH DAILY) 30 tablet 0  . zolpidem (AMBIEN) 10 MG tablet Take 10 mg by mouth at bedtime as needed for sleep.     No facility-administered medications prior to visit.      Allergies:   Hydrocodone; Hydrocodone-acetaminophen; and Tramadol   Social History   Socioeconomic History  . Marital status: Divorced    Spouse name: Not on file  .  Number of children: 2  . Years of education: Not on file  . Highest education level: Not on file  Occupational History  . Occupation: Pharmacist, hospital  Social Needs  . Financial resource strain: Not on file  . Food insecurity:    Worry: Not on file    Inability: Not on file  . Transportation needs:    Medical: Not on file    Non-medical: Not on file  Tobacco Use  . Smoking status: Former Smoker    Packs/day: 0.25    Years: 2.00    Pack years: 0.50    Types: Cigarettes    Last attempt to quit: 02/04/1976    Years since quitting: 41.8  . Smokeless tobacco: Never Used  Substance and Sexual Activity  . Alcohol use: Yes    Comment: occasional  . Drug use: No  . Sexual activity: Not on file  Lifestyle  . Physical activity:    Days per week: Not on file    Minutes per session: Not on file  . Stress: Not on file  Relationships  . Social connections:    Talks on phone: Not on file    Gets together: Not on file    Attends religious service: Not on file    Active member of club or organization: Not on file    Attends meetings of clubs or organizations: Not on file    Relationship status: Not on file  Other Topics Concern  . Not on file  Social History Narrative  . Not on file     Family History:  The patient's ***family history includes Cirrhosis in her father; Dementia in her mother; Leukemia in her child.   ROS:   Please see the history of present illness.    ROS All other systems reviewed and are negative.   PHYSICAL EXAM:   VS:  There were no vitals taken for this visit.   GEN: Well nourished, well developed, in no acute distress HEENT: normal Neck: no JVD, carotid bruits, or masses Cardiac: ***RRR; no murmurs, rubs, or gallops,no edema  Respiratory:  clear to auscultation bilaterally, normal work of breathing GI: soft, nontender, nondistended, + BS MS: no deformity or atrophy Skin: warm and dry, no rash Neuro:  Alert and Oriented x 3, Strength and sensation are  intact Psych: euthymic mood, full affect  Wt Readings from Last 3 Encounters:  08/21/17 163 lb 3.2 oz (74 kg)  07/16/17 169 lb (76.7 kg)  05/28/17 169 lb 15.6 oz (77.1 kg)      Studies/Labs Reviewed:   EKG:  EKG is*** ordered today.  The ekg ordered today demonstrates ***  Recent Labs: 07/16/2017: ALT 31; Hemoglobin 14.3; Magnesium 2.1; Platelets 184 07/17/2017: BUN 10; Creatinine, Ser 0.76; Potassium 3.7; Sodium 139   Lipid Panel    Component Value Date/Time   CHOL 192 05/26/2011 1245   TRIG 122 05/26/2011 1245   HDL 65 05/26/2011 1245   CHOLHDL 3.0 05/26/2011 1245   VLDL 24 05/26/2011 1245  Edwards 103 (H) 05/26/2011 1245    Additional studies/ records that were reviewed today include:  ***    ASSESSMENT:    No diagnosis found.   PLAN:  In order of problems listed above:  1. ***    Medication Adjustments/Labs and Tests Ordered: Current medicines are reviewed at length with the patient today.  Concerns regarding medicines are outlined above.  Medication changes, Labs and Tests ordered today are listed in the Patient Instructions below. There are no Patient Instructions on file for this visit.   Hilbert Corrigan, [place]  11/16/2017 1:22 PM    [facility] [place], [place], [place]  [address] Phone: [phone]; Fax: [phone]

## 2017-11-17 ENCOUNTER — Encounter: Payer: Self-pay | Admitting: *Deleted

## 2017-12-16 DIAGNOSIS — M79645 Pain in left finger(s): Secondary | ICD-10-CM | POA: Insufficient documentation

## 2017-12-16 DIAGNOSIS — M189 Osteoarthritis of first carpometacarpal joint, unspecified: Secondary | ICD-10-CM | POA: Insufficient documentation

## 2018-01-18 ENCOUNTER — Encounter: Payer: Self-pay | Admitting: Physical Therapy

## 2018-01-18 ENCOUNTER — Ambulatory Visit: Payer: Medicare Other | Attending: General Surgery | Admitting: Physical Therapy

## 2018-01-18 ENCOUNTER — Other Ambulatory Visit: Payer: Self-pay

## 2018-01-18 DIAGNOSIS — M6281 Muscle weakness (generalized): Secondary | ICD-10-CM | POA: Insufficient documentation

## 2018-01-18 DIAGNOSIS — R278 Other lack of coordination: Secondary | ICD-10-CM | POA: Diagnosis present

## 2018-01-18 NOTE — Patient Instructions (Signed)
Moisturizers . They are used in the vagina to hydrate the mucous membrane that make up the vaginal canal. . Designed to keep a more normal acid balance (ph) . Once placed in the vagina, it will last between two to three days.  . Use 2-3 times per week at bedtime and last longer than 60 min. . Ingredients to avoid is glycerin and fragrance, can increase chance of infection . Should not be used just before sex due to causing irritation . Most are gels administered either in a tampon-shaped applicator or as a vaginal suppository. They are non-hormonal.   Types of Moisturizers . Samul Dada- drug store . Vitamin E vaginal suppositories- Whole foods, Amazon . Moist Again . Coconut oil- can break down condoms . Julva- (Do no use if on Tamoxifen) amazon . Yes moisturizer- amazon . NeuEve Silk , NeuEve Silver for menopausal or over 65 (if have severe vaginal atrophy or cancer treatments use NeuEve Silk for  1 month than move to [employer] Pepsi)- [employer], MapleFlower.dk . Olive and Bee intimate cream- www.oliveandbee.com.au . Mae vaginal moisturizer- Amazon  Creams to use externally on the Vulva area  Albertson's (good for for cancer patients that had radiation to the area)- [place] or [employer].FlyingBasics.com.br  V-magic cream - amazon  Julva-amazon  Vital "V Wild Yam salve ( help moisturize and help with thinning vulvar area, does have [place] by Damiva labial moisturizer ([place],    Things to avoid in the vaginal area . Do not use things to irritate the vulvar area . No lotions just specialized creams for the vulva area- Neogyn, V-magic, No soaps; can use Aveeno or Calendula cleanser if needed. Must be gentle . No deodorants . No douches . Good to sleep without underwear to let the vaginal area to air out . No scrubbing: spread the lips to let warm water rinse over labias and pat dry  Lubrication . Used  for intercourse to reduce friction . Avoid ones that have glycerin, warming gels, tingling gels, icing or cooling gel, scented . Avoid parabens due to a preservative similar to female sex hormone . May need to be reapplied once or several times during sexual activity . Can be applied to both partners genitals prior to vaginal penetration to minimize friction or irritation . Prevent irritation and mucosal tears that cause post coital pain and increased the risk of vaginal and urinary tract infections . Oil-based lubricants cannot be used with condoms due to breaking them down.  Least likely to irritate vaginal tissue.  . Plant based-lubes are safe . Silicone-based lubrication are thicker and last long and used for post-menopausal women  Vaginal Lubricators Here is a list of some suggested lubricators you can use for intercourse. Use the most hypoallergenic product.  You can place on you or your partner.   Slippery Stuff  Sylk or Sliquid Natural H2O ( good  if frequent UTI's)  Blossom Organics (www.blossom-organics.com)  Luvena   Coconut oil  PJur Woman Nude- water based lubricant, amazon  Uberlube- Amazon  Aloe Vera  Yes lubricant- [employer] Platinum-Silicone, Target, [facility]  Olive and Bee intimate cream-  www.oliveandbee.com.au Things to avoid in lubricants are glycerin, warming gels, tingling gels, icing or cooling  gels, and scented gels.  Also avoid Vaseline. KY jelly, Replens, and Astroglide kills good bacteria(lactobacilli)  Things to avoid in the vaginal area . Do not use things to irritate the vulvar  area . No lotions- see below . No soaps; can use Aveeno or Calendula cleanser if needed. Must be gentle . No deodorants . No douches . Good to sleep without underwear to let the vaginal area to air out . No scrubbing: spread the lips to let warm water rinse over labias and pat dry  Creams that can be used on the [place] [employer] or [facility] [address] [place], [place] [address] Phone # [phone] Fax [phone]

## 2018-01-18 NOTE — Therapy (Signed)
[facility] [address], [place], [place], [address] Phone: [phone]   Fax:  [phone]  Physical Therapy Evaluation  Patient Details  Name: Cynthia Peters MRN: [id number] Date of Birth: 02/01/1951 Referring Provider (PT): dr. Ralene Ok   Encounter Date: 01/18/2018  PT End of Session - 01/18/18 1620    Visit Number  1    Date for PT Re-Evaluation  03/15/18    Authorization Type  [employer] medicare    PT Start Time  8527    PT Stop Time  1610    PT Time Calculation (min)  40 min    Activity Tolerance  Patient tolerated treatment well    Behavior During Therapy  [facility] for tasks assessed/performed       Past Medical History:  Diagnosis Date  . Anemia    history in the past, not on a regular basis  . Arthritis   . Bronchitis   . Cataracts, bilateral    immature  . Claustrophobia    takes Valium daily as needed  . Complication of anesthesia    2013 SURGERY vocal cord bothering pt, used smaller ent tube after 2 hernia repair, no problem after . " I woke up during my MRI and colonoscopy."       . Depression    takes Wellbutrin daily  . Dry eye   . Dry mouth   . GERD (gastroesophageal reflux disease)    takes Omeprazole daily   . H/O hiatal hernia   . Headache    migraines with flashing lights   . History of blood clots 40 yrs    leg   . History of echocardiogram 2013   a. Echo (05/2011): Mild LVH, EF 65-70%, no WMA, Gr 1 DD, mild AI.  Marland Kitchen History of MRSA infection   . History of staph infection   . History of stress test 2010   a. ETT-Echo in 2010 was normal  . History of TIA (transient ischemic attack) 2013   a. Carotid US (05/2011): No ICA stenosis, pt unsure of this  . Hyperlipidemia    takes Simvastatin daily   . Hypertension    takes  Losartan daily  . IBS (irritable bowel syndrome)    takes Bentyl daily as needed  . Insomnia    takes Ambien nightly  . Interstitial cystitis   . Lichen  planus   . Mild asthma    Albuterol inhaler as needed  . Palpitations    Event monitor in 2007 with rare PVCs // takes Metoprolol daily  . PVC (premature ventricular contraction)   . Seasonal allergies    takes Claritin daily as needed.Takes Singulair daily as needed.  Marland Kitchen TIA (transient ischemic attack) 05/27/2011  . Urethral stricture   . Vertigo    takes Meclizine daily as needed    Past Surgical History:  Procedure Laterality Date  . CESAREAN SECTION    . COLONOSCOPY WITH PROPOFOL N/A 08/10/2012   Procedure: COLONOSCOPY WITH PROPOFOL;  Surgeon: Garlan Fair, MD;  Location: [facility];  Service: Endoscopy;  Laterality: N/A;  . CYSTOSCOPY WITH URETHRAL DILATATION N/A 02/18/2013   Procedure: CYSTOSCOPY WITH URETHRAL DILATATION ( NO BALLOON) AND HYDRODISTENSION;  Surgeon: Alexis Frock, MD;  Location: [facility];  Service: Urology;  Laterality: N/A;  . ENDOMETRIAL ABLATION W/ HYDROTHERMABLATOR  09-27-2002  . INCISIONAL HERNIA REPAIR N/A 10/25/2014   Procedure: HERNIA REPAIR INCISIONAL;  Surgeon: Ralene Ok, MD;  Location: [place];  Service: General;  Laterality: N/A;  . INCISIONAL HERNIA REPAIR N/A 03/26/2015   Procedure: LAPAROSCOPIC INCISIONAL HERNIA;  Surgeon: Ralene Ok, MD;  Location: [place];  Service: General;  Laterality: N/A;  . INSERTION OF MESH N/A 10/25/2014   Procedure: INSERTION OF MESH;  Surgeon: Ralene Ok, MD;  Location: [place];  Service: General;  Laterality: N/A;  . LAPAROSCOPIC LYSIS OF ADHESIONS N/A 03/26/2015   Procedure: LAPAROSCOPIC LYSIS OF ADHESIONS;  Surgeon: Ralene Ok, MD;  Location: [place];  Service: General;  Laterality: N/A;  . LAPAROTOMY N/A 10/25/2014   Procedure: EXPLORATORY LAPAROTOMY;  Surgeon: Ralene Ok, MD;  Location: [place];  Service: General;  Laterality: N/A;  . LYSIS OF ADHESION N/A 10/25/2014   Procedure: LYSIS OF ADHESIONS ;  Surgeon: Ralene Ok, MD;  Location: [place];  Service: General;  Laterality: N/A;   . MUCOSAL ADVANCEMENT FLAP N/A 10/25/2014   Procedure: MUCOSAL ADVANCEMENT FLAP;  Surgeon: Ralene Ok, MD;  Location: [place];  Service: General;  Laterality: N/A;  . ORIF RADIAL FRACTURE Right 06/17/2016   Procedure: right radial head arthroplasty with ligament repair, reconstruciton;  Surgeon: Roseanne Kaufman, MD;  Location: [place];  Service: Orthopedics;  Laterality: Right;  . RADIAL HEAD ARTHROPLASTY Right 06/17/2016  . TOOTH EXTRACTION    . TRANSTHORACIC ECHOCARDIOGRAM  05-26-2011   MILD LVH/  EF 71-24%/  GRADE I DIASTOLIC DYSFUNCTION/  MILD AR//   06-20-2008  NOMRAL STRESS ECHO  . UMBILICAL HERNIA REPAIR  2013   AND REVISION THE SAME YEAR W/ MESH  . WISDOM TOOTH EXTRACTION  AGE 61    There were no vitals filed for this visit.   Subjective Assessment - 01/18/18 1537    Subjective  Patient has had 4 hernia surgeris with Mesh. I have had pelvic floor therapy in the past. Patient has had small intestine obstruction. Patient is numb on the left abdomen due to nerve damage.    Patient Stated Goals  Reduce the abdoment due to increased swelling    Currently in Pain?  Yes    Pain Score  3     Pain Location  Abdomen    Pain Orientation  Mid    Pain Descriptors / Indicators  Aching    Pain Type  Chronic pain    Pain Onset  More than a month ago    Pain Frequency  Intermittent    Aggravating Factors   standing    Pain Relieving Factors  rest         [facility] PT Assessment - 01/18/18 0001      Assessment   Medical Diagnosis  Z98.890 Status post hernia repair    Referring Provider (PT)  dr. Ralene Ok    Onset Date/Surgical Date  03/06/14    Prior Therapy  yes for pelvic floor      Precautions   Precautions  Other (comment)    Precaution Comments  hernia repairs with mest      Restrictions   Weight Bearing Restrictions  No      Balance Screen   Has the patient fallen in the past 6 months  No    Has the patient had a decrease in activity level because of a fear of  falling?   No    Is the patient reluctant to leave their home because of a fear of falling?   No      Home Film/video editor residence      Prior Function  Level of Independence  Independent      Cognition   Overall Cognitive Status  Within Functional Limits for tasks assessed      Posture/Postural Control   Posture/Postural Control  Postural limitations    Posture Comments  extended abdomen      ROM / Strength   AROM / PROM / Strength  AROM;PROM;Strength      AROM   Lumbar Extension  decreased by 75% due to multiple hernia surgeries    Lumbar - Right Side Bend  decreased by 25%      Strength   Overall Strength Comments  during abdominal contraction she has more contract superiorly than anteriorly,       Palpation   Palpation comment  decreased mobility of c-section scar, tenderness located around the abdomen,                 Objective measurements completed on examination: See above findings.    Pelvic Floor Special Questions - 01/18/18 0001    Prior Pregnancies  Yes    Number of C-Sections  1    Currently Sexually Active  No    Marinoff Scale  pain prevents any attempts at intercourse    Urinary Leakage  Yes    Activities that cause leaking  Coughing;Sneezing;Laughing    Urinary frequency  every 2-3 hours    Skin Integrity  Intact   dry   Pelvic Floor Internal Exam  Patient confirms identification and approves PT to assess the pelvic floor and treatment    Exam Type  Vaginal    Palpation  decreased contraction around the anterior pelvic floor and urethra spincter, decreased mobility of the perineal body and introitus    Strength  weak squeeze, no lift               PT Education - 01/18/18 1619    Education Details  information on vaginal moisturizers and lubricants    Person(s) Educated  Patient    Methods  Explanation;Handout    Comprehension  Verbalized understanding       PT Short Term Goals - 01/18/18 1647       PT SHORT TERM GOAL #1   Title  independent with intial HEP    Time  4    Period  Weeks    Status  New    Target Date  02/15/18      PT SHORT TERM GOAL #2   Title  understand how to use vaginal moisturizers and lubricants for improving vaginal dryness    Time  4    Period  Weeks    Status  New    Target Date  02/15/18      PT SHORT TERM GOAL #3   Title  abilty to brace her abdominals with equal contraction of the upper and lower abdominals    Time  4    Period  Weeks    Target Date  02/15/18      PT SHORT TERM GOAL #4   Title  educated on different corsets to use for abdominal support for daily activities    Time  4    Period  Weeks    Status  New    Target Date  02/15/18        PT Long Term Goals - 01/18/18 1649      PT LONG TERM GOAL #1   Title  independent with HEP and understand how to progress herself    Time  8  Period  Weeks    Status  New    Target Date  03/15/18      PT LONG TERM GOAL #2   Title  pelvic floor strength >/= 4/5 so she is able to contract her pelvic floor when laughing and sneezing without urine leakage    Time  8    Period  Weeks    Status  New    Target Date  03/15/18      PT LONG TERM GOAL #3   Title  reduction of fascial restrictions in the abdomen so she is able to perform an isometric contraction correctly while she is lifting item </= 20#    Time  8    Period  Weeks    Status  New    Target Date  03/15/18             Plan - 01/18/18 1611    Clinical Impression Statement  Patient is a 67 year old female with multiple hernia surgeries using mesh. Patient last hernia surgery was 03/2014. Patient has had several small bowel obstructions and last one was 07/2017. Patient hip strength is 4/5. Patient able to contract abdominals with increased contraction superiorly compared to inferiorly. Patient has fascial restrictions throughout the abdoment and umbilicus. Patient has decreased mobility of the c-section scar. Pelvic floor  strength is 2/5 with fascial restrictions in the introitus and aroung the urethra and urethra sphincter. Patient reports she had pain with intercourse. Patient is concerned about her abdomen bulging so much she is having difficulty with wearing pants. Patient umbilicus is off center to the right with pain on palpation. Patient reports urinary leakage with sneezing and coughing. Patient will benefit from skilled therapy to improve pelvic floor strength and core strength while releasing fascial restrictions.     History and Personal Factors relevant to plan of care:  hernia surgery with mesh x4; C-section; IBS; Lichen Planus    Clinical Presentation  Evolving    Clinical Presentation due to:  urinary leakage with sneezing and coughing, difficulty with contracting abdominals for daily tasks    Clinical Decision Making  Moderate    Rehab Potential  Excellent    Clinical Impairments Affecting Rehab Potential  urinary leakage with sneezing and coughing, difficulty with contracting abdominals for daily tasks    PT Frequency  1x / week    PT Duration  8 weeks    PT Treatment/Interventions  Biofeedback;Therapeutic activities;Therapeutic exercise;Neuromuscular re-education;Manual techniques;Patient/family education;Scar mobilization;Dry needling;Passive range of motion;Taping    PT Next Visit Plan  soft tissue work to the pelvic floor and abdomen, abdominal bracing correct lifting, lumbar stretches    Consulted and Agree with Plan of Care  Patient       Patient will benefit from skilled therapeutic intervention in order to improve the following deficits and impairments:  Increased fascial restricitons, Decreased mobility, Decreased scar mobility, Postural dysfunction, Decreased activity tolerance, Decreased range of motion, Decreased strength, Impaired flexibility  Visit Diagnosis: Muscle weakness (generalized) - Plan: PT plan of care cert/re-cert  Other lack of coordination - Plan: PT plan of care  cert/re-cert     Problem List Patient Active Problem List   Diagnosis Date Noted  . History of transient ischemic attack (TIA) 07/16/2017  . GERD (gastroesophageal reflux disease) 07/16/2017  . Partial small bowel obstruction ([place]) 07/16/2017  . SBO (small bowel obstruction) ([place]) 05/29/2017  . Right radial head fracture 06/17/2016  . Hypertension 05/30/2015  . Hyperlipidemia 05/30/2015  . Recurrent ventral  incisional hernia s/p lap repairs 2013, 2016, 2017 10/25/2014  . Anxiety 10/20/2014  . Irritable bowel syndrome 10/20/2014  . Interstitial cystitis 03/15/2013  . Lichen planus 92/17/8375  . PVC's (premature ventricular contractions)     Earlie Counts, PT 01/18/18 4:54 PM   [facility] [address], [place], [place], [address] Phone: [phone]   Fax:  [phone]  Name: Cynthia Peters MRN: [id number] Date of Birth: May 26, 1950

## 2018-02-08 ENCOUNTER — Ambulatory Visit: Payer: Medicare Other | Attending: General Surgery | Admitting: Physical Therapy

## 2018-02-08 ENCOUNTER — Encounter: Payer: Self-pay | Admitting: Physical Therapy

## 2018-02-08 DIAGNOSIS — R278 Other lack of coordination: Secondary | ICD-10-CM | POA: Diagnosis present

## 2018-02-08 DIAGNOSIS — M6281 Muscle weakness (generalized): Secondary | ICD-10-CM | POA: Diagnosis not present

## 2018-02-08 NOTE — Patient Instructions (Signed)
Access Code: [email or web address]  URL: [email or web address]  Date: 02/08/2018  Prepared by: Earlie Counts   Exercises  Supine Transversus Abdominis Bracing with Double Leg Fallout - 10 reps - 1 sets - 5 sec hold - 2x daily - 7x weekly  [facility] [address], [place], [place] [address] Phone # [phone] Fax [phone]

## 2018-02-08 NOTE — Therapy (Signed)
[facility] [address] [place], [place], [address] Phone: [phone]   Fax:  [phone]  Physical Therapy Treatment  Patient Details  Name: Cynthia Peters MRN: [id number] Date of Birth: 1950-11-22 Referring Provider (PT): dr. Ralene Ok   Encounter Date: 02/08/2018  PT End of Session - 02/08/18 1619    Visit Number  2    Date for PT Re-Evaluation  03/15/18    Authorization Type  [employer] medicare    PT Start Time  9628    PT Stop Time  1615    PT Time Calculation (min)  45 min    Activity Tolerance  Patient tolerated treatment well    Behavior During Therapy  [facility] for tasks assessed/performed       Past Medical History:  Diagnosis Date  . Anemia    history in the past, not on a regular basis  . Arthritis   . Bronchitis   . Cataracts, bilateral    immature  . Claustrophobia    takes Valium daily as needed  . Complication of anesthesia    2013 SURGERY vocal cord bothering pt, used smaller ent tube after 2 hernia repair, no problem after . " I woke up during my MRI and colonoscopy."       . Depression    takes Wellbutrin daily  . Dry eye   . Dry mouth   . GERD (gastroesophageal reflux disease)    takes Omeprazole daily   . H/O hiatal hernia   . Headache    migraines with flashing lights   . History of blood clots 40 yrs    leg   . History of echocardiogram 2013   a. Echo (05/2011): Mild LVH, EF 65-70%, no WMA, Gr 1 DD, mild AI.  Marland Kitchen History of MRSA infection   . History of staph infection   . History of stress test 2010   a. ETT-Echo in 2010 was normal  . History of TIA (transient ischemic attack) 2013   a. Carotid US (05/2011): No ICA stenosis, pt unsure of this  . Hyperlipidemia    takes Simvastatin daily   . Hypertension    takes  Losartan daily  . IBS (irritable bowel syndrome)    takes Bentyl daily as needed  . Insomnia    takes Ambien nightly  . Interstitial cystitis   . Lichen  planus   . Mild asthma    Albuterol inhaler as needed  . Palpitations    Event monitor in 2007 with rare PVCs // takes Metoprolol daily  . PVC (premature ventricular contraction)   . Seasonal allergies    takes Claritin daily as needed.Takes Singulair daily as needed.  Marland Kitchen TIA (transient ischemic attack) 05/27/2011  . Urethral stricture   . Vertigo    takes Meclizine daily as needed    Past Surgical History:  Procedure Laterality Date  . CESAREAN SECTION    . COLONOSCOPY WITH PROPOFOL N/A 08/10/2012   Procedure: COLONOSCOPY WITH PROPOFOL;  Surgeon: Garlan Fair, MD;  Location: [facility];  Service: Endoscopy;  Laterality: N/A;  . CYSTOSCOPY WITH URETHRAL DILATATION N/A 02/18/2013   Procedure: CYSTOSCOPY WITH URETHRAL DILATATION ( NO BALLOON) AND HYDRODISTENSION;  Surgeon: Alexis Frock, MD;  Location: [facility];  Service: Urology;  Laterality: N/A;  . ENDOMETRIAL ABLATION W/ HYDROTHERMABLATOR  09-27-2002  . INCISIONAL HERNIA REPAIR N/A 10/25/2014   Procedure: HERNIA REPAIR INCISIONAL;  Surgeon: Ralene Ok, MD;  Location: [place];  Service: General;  Laterality: N/A;  . INCISIONAL HERNIA REPAIR N/A 03/26/2015   Procedure: LAPAROSCOPIC INCISIONAL HERNIA;  Surgeon: Ralene Ok, MD;  Location: [place];  Service: General;  Laterality: N/A;  . INSERTION OF MESH N/A 10/25/2014   Procedure: INSERTION OF MESH;  Surgeon: Ralene Ok, MD;  Location: [place];  Service: General;  Laterality: N/A;  . LAPAROSCOPIC LYSIS OF ADHESIONS N/A 03/26/2015   Procedure: LAPAROSCOPIC LYSIS OF ADHESIONS;  Surgeon: Ralene Ok, MD;  Location: [place];  Service: General;  Laterality: N/A;  . LAPAROTOMY N/A 10/25/2014   Procedure: EXPLORATORY LAPAROTOMY;  Surgeon: Ralene Ok, MD;  Location: [place];  Service: General;  Laterality: N/A;  . LYSIS OF ADHESION N/A 10/25/2014   Procedure: LYSIS OF ADHESIONS ;  Surgeon: Ralene Ok, MD;  Location: [place];  Service: General;  Laterality: N/A;   . MUCOSAL ADVANCEMENT FLAP N/A 10/25/2014   Procedure: MUCOSAL ADVANCEMENT FLAP;  Surgeon: Ralene Ok, MD;  Location: [place];  Service: General;  Laterality: N/A;  . ORIF RADIAL FRACTURE Right 06/17/2016   Procedure: right radial head arthroplasty with ligament repair, reconstruciton;  Surgeon: Roseanne Kaufman, MD;  Location: [place];  Service: Orthopedics;  Laterality: Right;  . RADIAL HEAD ARTHROPLASTY Right 06/17/2016  . TOOTH EXTRACTION    . TRANSTHORACIC ECHOCARDIOGRAM  05-26-2011   MILD LVH/  EF 92-33%/  GRADE I DIASTOLIC DYSFUNCTION/  MILD AR//   06-20-2008  NOMRAL STRESS ECHO  . UMBILICAL HERNIA REPAIR  2013   AND REVISION THE SAME YEAR W/ MESH  . WISDOM TOOTH EXTRACTION  AGE 83    There were no vitals filed for this visit.  Subjective Assessment - 02/08/18 1536    Subjective  No changes yet. I had some pulling in the abdomen.     Patient Stated Goals  Reduce the abdoment due to increased swelling    Currently in Pain?  Yes    Pain Score  3     Pain Location  Abdomen    Pain Orientation  Mid    Pain Descriptors / Indicators  Aching    Pain Type  Chronic pain    Pain Onset  More than a month ago    Pain Frequency  Intermittent    Aggravating Factors   standing    Pain Relieving Factors  rest    Multiple Pain Sites  No                       [facility] Adult PT Treatment/Exercise - 02/08/18 0001      Lumbar Exercises: Supine   Ab Set  10 reps;5 seconds      Manual Therapy   Manual Therapy  Soft tissue mobilization;Myofascial release    Soft tissue mobilization  adominal massage in cirecular pattern, soft tissue work to the diaphragm    Myofascial Release  releasing restrictions in the umbilicus, relaase the right upper quadrant with right leg movement, release of the transverse abdominus             PT Education - 02/08/18 1613    Education Details  Access Code: [email or web address]     Person(s) Educated  Patient    Methods  Explanation;Demonstration;Verbal  cues;Handout    Comprehension  Verbalized understanding;Returned demonstration       PT Short Term Goals - 01/18/18 1647      PT SHORT TERM GOAL #1   Title  independent with intial HEP    Time  4  Period  Weeks    Status  New    Target Date  02/15/18      PT SHORT TERM GOAL #2   Title  understand how to use vaginal moisturizers and lubricants for improving vaginal dryness    Time  4    Period  Weeks    Status  New    Target Date  02/15/18      PT SHORT TERM GOAL #3   Title  abilty to brace her abdominals with equal contraction of the upper and lower abdominals    Time  4    Period  Weeks    Target Date  02/15/18      PT SHORT TERM GOAL #4   Title  educated on different corsets to use for abdominal support for daily activities    Time  4    Period  Weeks    Status  New    Target Date  02/15/18        PT Long Term Goals - 01/18/18 1649      PT LONG TERM GOAL #1   Title  independent with HEP and understand how to progress herself    Time  8    Period  Weeks    Status  New    Target Date  03/15/18      PT LONG TERM GOAL #2   Title  pelvic floor strength >/= 4/5 so she is able to contract her pelvic floor when laughing and sneezing without urine leakage    Time  8    Period  Weeks    Status  New    Target Date  03/15/18      PT LONG TERM GOAL #3   Title  reduction of fascial restrictions in the abdomen so she is able to perform an isometric contraction correctly while she is lifting item </= 20#    Time  8    Period  Weeks    Status  New    Target Date  03/15/18            Plan - 02/08/18 1538    Clinical Impression Statement  Patient still needs instruction on vaginal moisturizers and lubricants. Patient is able to perform abdominal bracing correctly. Patient had scar tissue release after manual work. Patient just started therapy so she has not met goals. Patient will benefit from skilled therapy to improve pelvic floor strength and core strength  while releasing fascial restrictions.     Rehab Potential  Excellent    Clinical Impairments Affecting Rehab Potential  urinary leakage with sneezing and coughing, difficulty with contracting abdominals for daily tasks    PT Frequency  1x / week    PT Duration  8 weeks    PT Treatment/Interventions  Biofeedback;Therapeutic activities;Therapeutic exercise;Neuromuscular re-education;Manual techniques;Patient/family education;Scar mobilization;Dry needling;Passive range of motion;Taping    PT Next Visit Plan  soft tissue work to the pelvic floor and abdomen, abdominal bracing correct lifting, lumbar stretches; umbilicus soft tissue work for home    Recommended Other Services  MD signed the initial note    Consulted and Agree with Plan of Care  Patient       Patient will benefit from skilled therapeutic intervention in order to improve the following deficits and impairments:  Increased fascial restricitons, Decreased mobility, Decreased scar mobility, Postural dysfunction, Decreased activity tolerance, Decreased range of motion, Decreased strength, Impaired flexibility  Visit Diagnosis: Muscle weakness (generalized)  Other lack of coordination  Problem List Patient Active Problem List   Diagnosis Date Noted  . History of transient ischemic attack (TIA) 07/16/2017  . GERD (gastroesophageal reflux disease) 07/16/2017  . Partial small bowel obstruction ([place]) 07/16/2017  . SBO (small bowel obstruction) ([place]) 05/29/2017  . Right radial head fracture 06/17/2016  . Hypertension 05/30/2015  . Hyperlipidemia 05/30/2015  . Recurrent ventral incisional hernia s/p lap repairs 2013, 2016, 2017 10/25/2014  . Anxiety 10/20/2014  . Irritable bowel syndrome 10/20/2014  . Interstitial cystitis 03/15/2013  . Lichen planus 35/46/5681  . PVC's (premature ventricular contractions)     Earlie Counts, PT 02/08/18 4:20 PM   [facility] [address] [place], [place], [address] Phone: [phone]   Fax:  [phone]  Name: Cynthia Peters MRN: [id number] Date of Birth: 02/19/1950

## 2018-02-15 ENCOUNTER — Ambulatory Visit: Payer: Medicare Other | Admitting: Physical Therapy

## 2018-02-15 ENCOUNTER — Encounter: Payer: Self-pay | Admitting: Physical Therapy

## 2018-02-15 DIAGNOSIS — M6281 Muscle weakness (generalized): Secondary | ICD-10-CM

## 2018-02-15 DIAGNOSIS — R278 Other lack of coordination: Secondary | ICD-10-CM

## 2018-02-15 NOTE — Therapy (Signed)
[facility] [address], [place], [place], [address] Phone: [phone]   Fax:  [phone]  Physical Therapy Treatment  Patient Details  Name: Cynthia Peters MRN: [id number] Date of Birth: [birth year] Referring Provider (PT): dr. Ralene Ok   Encounter Date: 02/15/2018  PT End of Session - 02/15/18 1616    Visit Number  3    Date for PT Re-Evaluation  03/15/18    Authorization Type  [employer] medicare    PT Start Time  7253    PT Stop Time  1610    PT Time Calculation (min)  40 min    Activity Tolerance  Patient tolerated treatment well    Behavior During Therapy  [facility] for tasks assessed/performed       Past Medical History:  Diagnosis Date  . Anemia    history in the past, not on a regular basis  . Arthritis   . Bronchitis   . Cataracts, bilateral    immature  . Claustrophobia    takes Valium daily as needed  . Complication of anesthesia    2013 SURGERY vocal cord bothering pt, used smaller ent tube after 2 hernia repair, no problem after . " I woke up during my MRI and colonoscopy."       . Depression    takes Wellbutrin daily  . Dry eye   . Dry mouth   . GERD (gastroesophageal reflux disease)    takes Omeprazole daily   . H/O hiatal hernia   . Headache    migraines with flashing lights   . History of blood clots 40 yrs    leg   . History of echocardiogram 2013   a. Echo (05/2011): Mild LVH, EF 65-70%, no WMA, Gr 1 DD, mild AI.  Marland Kitchen History of MRSA infection   . History of staph infection   . History of stress test 2010   a. ETT-Echo in 2010 was normal  . History of TIA (transient ischemic attack) 2013   a. Carotid US (05/2011): No ICA stenosis, pt unsure of this  . Hyperlipidemia    takes Simvastatin daily   . Hypertension    takes  Losartan daily  . IBS (irritable bowel syndrome)    takes Bentyl daily as needed  . Insomnia    takes Ambien nightly  . Interstitial cystitis   . Lichen  planus   . Mild asthma    Albuterol inhaler as needed  . Palpitations    Event monitor in 2007 with rare PVCs // takes Metoprolol daily  . PVC (premature ventricular contraction)   . Seasonal allergies    takes Claritin daily as needed.Takes Singulair daily as needed.  Marland Kitchen TIA (transient ischemic attack) 05/27/2011  . Urethral stricture   . Vertigo    takes Meclizine daily as needed    Past Surgical History:  Procedure Laterality Date  . CESAREAN SECTION    . COLONOSCOPY WITH PROPOFOL N/A 08/10/2012   Procedure: COLONOSCOPY WITH PROPOFOL;  Surgeon: Garlan Fair, MD;  Location: [facility];  Service: Endoscopy;  Laterality: N/A;  . CYSTOSCOPY WITH URETHRAL DILATATION N/A 02/18/2013   Procedure: CYSTOSCOPY WITH URETHRAL DILATATION ( NO BALLOON) AND HYDRODISTENSION;  Surgeon: Alexis Frock, MD;  Location: [facility];  Service: Urology;  Laterality: N/A;  . ENDOMETRIAL ABLATION W/ HYDROTHERMABLATOR  09-27-2002  . INCISIONAL HERNIA REPAIR N/A 10/25/2014   Procedure: HERNIA REPAIR INCISIONAL;  Surgeon: Ralene Ok, MD;  Location: [place];  Service: General;  Laterality: N/A;  . INCISIONAL HERNIA REPAIR N/A 03/26/2015   Procedure: LAPAROSCOPIC INCISIONAL HERNIA;  Surgeon: Ralene Ok, MD;  Location: [place];  Service: General;  Laterality: N/A;  . INSERTION OF MESH N/A 10/25/2014   Procedure: INSERTION OF MESH;  Surgeon: Ralene Ok, MD;  Location: [place];  Service: General;  Laterality: N/A;  . LAPAROSCOPIC LYSIS OF ADHESIONS N/A 03/26/2015   Procedure: LAPAROSCOPIC LYSIS OF ADHESIONS;  Surgeon: Ralene Ok, MD;  Location: [place];  Service: General;  Laterality: N/A;  . LAPAROTOMY N/A 10/25/2014   Procedure: EXPLORATORY LAPAROTOMY;  Surgeon: Ralene Ok, MD;  Location: [place];  Service: General;  Laterality: N/A;  . LYSIS OF ADHESION N/A 10/25/2014   Procedure: LYSIS OF ADHESIONS ;  Surgeon: Ralene Ok, MD;  Location: [place];  Service: General;  Laterality: N/A;   . MUCOSAL ADVANCEMENT FLAP N/A 10/25/2014   Procedure: MUCOSAL ADVANCEMENT FLAP;  Surgeon: Ralene Ok, MD;  Location: [place];  Service: General;  Laterality: N/A;  . ORIF RADIAL FRACTURE Right 06/17/2016   Procedure: right radial head arthroplasty with ligament repair, reconstruciton;  Surgeon: Roseanne Kaufman, MD;  Location: [place];  Service: Orthopedics;  Laterality: Right;  . RADIAL HEAD ARTHROPLASTY Right 06/17/2016  . TOOTH EXTRACTION    . TRANSTHORACIC ECHOCARDIOGRAM  05-26-2011   MILD LVH/  EF 40-98%/  GRADE I DIASTOLIC DYSFUNCTION/  MILD AR//   06-20-2008  NOMRAL STRESS ECHO  . UMBILICAL HERNIA REPAIR  2013   AND REVISION THE SAME YEAR W/ MESH  . WISDOM TOOTH EXTRACTION  AGE 52    There were no vitals filed for this visit.  Subjective Assessment - 02/15/18 1530    Subjective  My abdomen feel the same. NO changes in putting pants on. Last night the uupper abdomen was bigger than it has.     Patient Stated Goals  Reduce the abdoment due to increased swelling    Currently in Pain?  Yes    Pain Score  3     Pain Location  Abdomen    Pain Orientation  Mid    Pain Descriptors / Indicators  Aching    Pain Type  Chronic pain    Pain Onset  More than a month ago    Pain Frequency  Intermittent    Aggravating Factors   stanging    Pain Relieving Factors  rest    Multiple Pain Sites  No                    Pelvic Floor Special Questions - 02/15/18 0001    Pelvic Floor Internal Exam  Patient confirms identification and approves PT to assess the pelvic floor and treatment    Exam Type  Vaginal    Palpation  tightness and tenderness located in left posterior perineal body    Strength  weak squeeze, no lift   hugs therapist finger better       [facility] Adult PT Treatment/Exercise - 02/15/18 0001      Self-Care   Self-Care  Other Self-Care Comments    Other Self-Care Comments   discussed with patient on seeing a dietician      Neuro Re-ed    Neuro Re-ed Details    pelvic floor contraction in hookly with quick flicks and tactile cues      Manual Therapy   Manual Therapy  Internal Pelvic Floor    Soft tissue mobilization  adominal massage in cirecular pattern, soft tissue work  to the diaphragm    Myofascial Release  releasing restrictions in the umbilicus, relaase the right upper quadrant with right leg movement, release of the transverse abdominus             PT Education - 02/15/18 1611    Education Details  Pelvic floor contraction i nhookly    Person(s) Educated  Patient    Methods  Explanation;Demonstration;Verbal cues;Handout    Comprehension  Returned demonstration;Verbalized understanding       PT Short Term Goals - 02/15/18 1704      PT SHORT TERM GOAL #1   Title  independent with intial HEP    Time  4    Period  Weeks    Status  On-going      PT SHORT TERM GOAL #2   Title  understand how to use vaginal moisturizers and lubricants for improving vaginal dryness    Time  4    Period  Weeks    Status  On-going      PT SHORT TERM GOAL #3   Title  abilty to brace her abdominals with equal contraction of the upper and lower abdominals    Time  4    Period  Weeks    Status  On-going      PT SHORT TERM GOAL #4   Title  educated on different corsets to use for abdominal support for daily activities    Time  4    Period  Weeks    Status  On-going        PT Long Term Goals - 01/18/18 1649      PT LONG TERM GOAL #1   Title  independent with HEP and understand how to progress herself    Time  8    Period  Weeks    Status  New    Target Date  03/15/18      PT LONG TERM GOAL #2   Title  pelvic floor strength >/= 4/5 so she is able to contract her pelvic floor when laughing and sneezing without urine leakage    Time  8    Period  Weeks    Status  New    Target Date  03/15/18      PT LONG TERM GOAL #3   Title  reduction of fascial restrictions in the abdomen so she is able to perform an isometric contraction correctly  while she is lifting item </= 20#    Time  8    Period  Weeks    Status  New    Target Date  03/15/18            Plan - 02/15/18 1617    Clinical Impression Statement  Perineal body is very sensitive on the left and feels like the skin is tearing with soft tissue work. Patient has tightening of the lower abdominal . Patient needs abdominal bracing. Patient still feels the area is swollen on the upper abdomen. Patient has improved mobility of the abdominal tissue. Patient will benefit from skilled therapy to imporve pelvic floor strength and core strength while releaseing fascial restrictions.     Rehab Potential  Excellent    Clinical Impairments Affecting Rehab Potential  urinary leakage with sneezing and coughing, difficulty with contracting abdominals for daily tasks    PT Frequency  1x / week    PT Duration  8 weeks    PT Treatment/Interventions  Biofeedback;Therapeutic activities;Therapeutic exercise;Neuromuscular re-education;Manual techniques;Patient/family education;Scar mobilization;Dry needling;Passive range of  motion;Taping    PT Next Visit Plan  soft tissue work to the pelvic floor and abdomen, abdominal bracing correct lifting,  umbilicus soft tissue work for home    Consulted and Agree with Plan of Care  Patient       Patient will benefit from skilled therapeutic intervention in order to improve the following deficits and impairments:  Increased fascial restricitons, Decreased mobility, Decreased scar mobility, Postural dysfunction, Decreased activity tolerance, Decreased range of motion, Decreased strength, Impaired flexibility  Visit Diagnosis: Muscle weakness (generalized)  Other lack of coordination     Problem List Patient Active Problem List   Diagnosis Date Noted  . History of transient ischemic attack (TIA) 07/16/2017  . GERD (gastroesophageal reflux disease) 07/16/2017  . Partial small bowel obstruction (B and E) 07/16/2017  . SBO (small bowel obstruction)  ([place]) 05/29/2017  . Right radial head fracture 06/17/2016  . Hypertension 05/30/2015  . Hyperlipidemia 05/30/2015  . Recurrent ventral incisional hernia s/p lap repairs 2013, 2016, 2017 10/25/2014  . Anxiety 10/20/2014  . Irritable bowel syndrome 10/20/2014  . Interstitial cystitis 03/15/2013  . Lichen planus 18/55/0158  . PVC's (premature ventricular contractions)     Earlie Counts, PT 02/15/18 5:05 PM   [facility] [address] [place], [place], [address] Phone: [phone]   Fax:  [phone]  Name: Cynthia Peters MRN: [id number] Date of Birth: 17-Jan-1951

## 2018-02-15 NOTE — Patient Instructions (Addendum)
Quick Contraction: Gravity Eliminated (Hook-Lying)    Lie with hips and knees bent. Quickly squeeze then fully relax pelvic floor. Perform _1__ sets of _5__. Rest for __1_ seconds between sets. Do _2__ times a day.   Copyright  VHI. All rights reserved.  Slow Contraction: Gravity Eliminated (Hook-Lying)    Lie with hips and knees bent. Slowly squeeze pelvic floor for _5__ seconds. Rest for 5___ seconds. Repeat _10__ times. Do __3_ times a day.   Copyright  VHI. All rights reserved.  [facility] [address] [place], [place] [address] Phone # [phone] Fax [phone]

## 2018-02-22 ENCOUNTER — Ambulatory Visit: Payer: Medicare Other | Admitting: Physical Therapy

## 2018-02-22 ENCOUNTER — Encounter: Payer: Self-pay | Admitting: Physical Therapy

## 2018-02-22 DIAGNOSIS — R278 Other lack of coordination: Secondary | ICD-10-CM

## 2018-02-22 DIAGNOSIS — M6281 Muscle weakness (generalized): Secondary | ICD-10-CM

## 2018-02-22 NOTE — Patient Instructions (Addendum)
[email or web address]: they have corsets, highwaisted panties, bodysuits  [email or web address]   [employer]   About Abdominal Massage  Abdominal massage, also called external colon massage, is a self-treatment circular massage technique that can reduce and eliminate gas and ease constipation. The colon naturally contracts in waves in a clockwise direction starting from inside the right hip, moving up toward the ribs, across the belly, and down inside the left hip.  When you perform circular abdominal massage, you help stimulate your colon's normal wave pattern of movement called peristalsis.  It is most beneficial when done after eating.  Positioning You can practice abdominal massage with oil while lying down, or in the shower with soap.  Some people find that it is just as effective to do the massage through clothing while sitting or standing.  How to Massage Start by placing your finger tips or knuckles on your right side, just inside your hip bone.  . Make small circular movements while you move upward toward your rib cage.   . Once you reach the bottom right side of your rib cage, take your circular movements across to the left side of the bottom of your rib cage.  . Next, move downward until you reach the inside of your left hip bone.  This is the path your feces travel in your colon. . Continue to perform your abdominal massage in this pattern for 2-3 minutes each day.       You can apply as much pressure as is comfortable in your massage.  Start gently and build pressure as you continue to practice.  Notice any areas of pain as you massage; areas of slight pain may be relieved as you massage, but if you have areas of significant or intense pain, consult with your healthcare provider.  Other Considerations . General physical activity including bending and stretching can have a beneficial massage-like effect on the colon.  Deep breathing can also stimulate the colon because breathing  deeply activates the same nervous system that supplies the colon.   . Abdominal massage should always be used in combination with a bowel-conscious diet that is high in the proper type of fiber for you, fluids (primarily water), and a regular exercise program.    Start at hip bones. Place palms of hands on lower abdomen and move hand inward and down to stimulate the lymph system. Do 15 times ( like making a letter L)  [facility] [address] [place], [place] [address] Phone # [phone] Fax [phone]

## 2018-02-22 NOTE — Therapy (Signed)
[facility] [address] [place], [place], [address] Phone: [phone]   Fax:  [phone]  Physical Therapy Treatment  Patient Details  Name: Cynthia Peters MRN: [id number] Date of Birth: 01/26/1951 Referring Provider (PT): dr. Ralene Ok   Encounter Date: 02/22/2018  PT End of Session - 02/22/18 1633    Visit Number  4    Date for PT Re-Evaluation  03/15/18    Authorization Type  [employer] medicare    PT Start Time  8676    PT Stop Time  1630    PT Time Calculation (min)  55 min    Activity Tolerance  Patient tolerated treatment well    Behavior During Therapy  [facility] for tasks assessed/performed       Past Medical History:  Diagnosis Date  . Anemia    history in the past, not on a regular basis  . Arthritis   . Bronchitis   . Cataracts, bilateral    immature  . Claustrophobia    takes Valium daily as needed  . Complication of anesthesia    2013 SURGERY vocal cord bothering pt, used smaller ent tube after 2 hernia repair, no problem after . " I woke up during my MRI and colonoscopy."       . Depression    takes Wellbutrin daily  . Dry eye   . Dry mouth   . GERD (gastroesophageal reflux disease)    takes Omeprazole daily   . H/O hiatal hernia   . Headache    migraines with flashing lights   . History of blood clots 40 yrs    leg   . History of echocardiogram 2013   a. Echo (05/2011): Mild LVH, EF 65-70%, no WMA, Gr 1 DD, mild AI.  Marland Kitchen History of MRSA infection   . History of staph infection   . History of stress test 2010   a. ETT-Echo in 2010 was normal  . History of TIA (transient ischemic attack) 2013   a. Carotid US (05/2011): No ICA stenosis, pt unsure of this  . Hyperlipidemia    takes Simvastatin daily   . Hypertension    takes  Losartan daily  . IBS (irritable bowel syndrome)    takes Bentyl daily as needed  . Insomnia    takes Ambien nightly  . Interstitial cystitis   . Lichen  planus   . Mild asthma    Albuterol inhaler as needed  . Palpitations    Event monitor in 2007 with rare PVCs // takes Metoprolol daily  . PVC (premature ventricular contraction)   . Seasonal allergies    takes Claritin daily as needed.Takes Singulair daily as needed.  Marland Kitchen TIA (transient ischemic attack) 05/27/2011  . Urethral stricture   . Vertigo    takes Meclizine daily as needed    Past Surgical History:  Procedure Laterality Date  . CESAREAN SECTION    . COLONOSCOPY WITH PROPOFOL N/A 08/10/2012   Procedure: COLONOSCOPY WITH PROPOFOL;  Surgeon: Garlan Fair, MD;  Location: [facility];  Service: Endoscopy;  Laterality: N/A;  . CYSTOSCOPY WITH URETHRAL DILATATION N/A 02/18/2013   Procedure: CYSTOSCOPY WITH URETHRAL DILATATION ( NO BALLOON) AND HYDRODISTENSION;  Surgeon: Alexis Frock, MD;  [facility];  Service: Urology;  Laterality: N/A;  . ENDOMETRIAL ABLATION W/ HYDROTHERMABLATOR  09-27-2002  . INCISIONAL HERNIA REPAIR N/A 10/25/2014   Procedure: HERNIA REPAIR INCISIONAL;  Surgeon: Ralene Ok, MD;  Location: [place];  Service: General;  Laterality: N/A;  . INCISIONAL HERNIA REPAIR N/A 03/26/2015   Procedure: LAPAROSCOPIC INCISIONAL HERNIA;  Surgeon: Ralene Ok, MD;  Location: [place];  Service: General;  Laterality: N/A;  . INSERTION OF MESH N/A 10/25/2014   Procedure: INSERTION OF MESH;  Surgeon: Ralene Ok, MD;  Location: [place];  Service: General;  Laterality: N/A;  . LAPAROSCOPIC LYSIS OF ADHESIONS N/A 03/26/2015   Procedure: LAPAROSCOPIC LYSIS OF ADHESIONS;  Surgeon: Ralene Ok, MD;  Location: [place];  Service: General;  Laterality: N/A;  . LAPAROTOMY N/A 10/25/2014   Procedure: EXPLORATORY LAPAROTOMY;  Surgeon: Ralene Ok, MD;  Location: [place];  Service: General;  Laterality: N/A;  . LYSIS OF ADHESION N/A 10/25/2014   Procedure: LYSIS OF ADHESIONS ;  Surgeon: Ralene Ok, MD;  Location: [place];  Service: General;  Laterality: N/A;   . MUCOSAL ADVANCEMENT FLAP N/A 10/25/2014   Procedure: MUCOSAL ADVANCEMENT FLAP;  Surgeon: Ralene Ok, MD;  Location: [place];  Service: General;  Laterality: N/A;  . ORIF RADIAL FRACTURE Right 06/17/2016   Procedure: right radial head arthroplasty with ligament repair, reconstruciton;  Surgeon: Roseanne Kaufman, MD;  Location: [place];  Service: Orthopedics;  Laterality: Right;  . RADIAL HEAD ARTHROPLASTY Right 06/17/2016  . TOOTH EXTRACTION    . TRANSTHORACIC ECHOCARDIOGRAM  05-26-2011   MILD LVH/  EF 29-52%/  GRADE I DIASTOLIC DYSFUNCTION/  MILD AR//   06-20-2008  NOMRAL STRESS ECHO  . UMBILICAL HERNIA REPAIR  2013   AND REVISION THE SAME YEAR W/ MESH  . WISDOM TOOTH EXTRACTION  AGE 89    There were no vitals filed for this visit.      [facility] PT Assessment - 02/22/18 0001      Observation/Other Assessments   Observations  abdominal circumference prior to treatment was 841.3 cm at umbilicus and end of treatment was 110 cm                   [facility] Adult PT Treatment/Exercise - 02/22/18 0001      Therapeutic Activites    Therapeutic Activities  Lifting;ADL's    ADL's  turning in bed with abdominal bracing, sit to stand with abdominal bracing and correct breathing    Lifting  mini squat to lift items off floor with abdominal bracing and correct breathing      Lumbar Exercises: Seated   Sit to Stand  5 reps    with abdominal bracing   Other Seated Lumbar Exercises  abdominal bracing contracting 5 sec 5 times      Manual Therapy   Manual Therapy  Myofascial release;Soft tissue mobilization;Manual Lymphatic Drainage (MLD)    Soft tissue mobilization  to abdominal tissue throughout    Myofascial Release  release 3 planes of fascia for the diaphgram with less tenderness afterwards; release of bilateral lower quadrant to release the fascia    Manual Lymphatic Drainage (MLD)  to lower abdomen, abdominal lymphatics colon stimulation, deep lymphatic abdominal stimulation              PT Education - 02/22/18 1547    Education Details  information on shapewear    Person(s) Educated  Patient    Methods  Explanation;Handout    Comprehension  Verbalized understanding       PT Short Term Goals - 02/22/18 1641      PT SHORT TERM GOAL #4   Title  educated on different corsets to use for abdominal support for daily activities  Time  4    Period  Weeks    Status  On-going        PT Long Term Goals - 01/18/18 1649      PT LONG TERM GOAL #1   Title  independent with HEP and understand how to progress herself    Time  8    Period  Weeks    Status  New    Target Date  03/15/18      PT LONG TERM GOAL #2   Title  pelvic floor strength >/= 4/5 so she is able to contract her pelvic floor when laughing and sneezing without urine leakage    Time  8    Period  Weeks    Status  New    Target Date  03/15/18      PT LONG TERM GOAL #3   Title  reduction of fascial restrictions in the abdomen so she is able to perform an isometric contraction correctly while she is lifting item </= 20#    Time  8    Period  Weeks    Status  New    Target Date  03/15/18            Plan - 02/22/18 1538    Clinical Impression Statement  Patient circumference decreased from 112.5cm to 110 cm. Patient abdomen was softer. Patient was able to urinate with greater ease and bigger stream.  Patient is able to brace her abdomen in sitting, turning in bed, and lifting items from the floor. Patient will benefit from skilled therapy to improve pelvic floor strength and core strength  while releasing fascial restrictions.     Clinical Impairments Affecting Rehab Potential  urinary leakage with sneezing and coughing, difficulty with contracting abdominals for daily tasks    PT Frequency  1x / week    PT Duration  8 weeks    PT Treatment/Interventions  Biofeedback;Therapeutic activities;Therapeutic exercise;Neuromuscular re-education;Manual techniques;Patient/family education;Scar  mobilization;Dry needling;Passive range of motion;Taping    PT Next Visit Plan  soft tissue work to the pelvic floor and abdomen,   umbilicus soft tissue work for home; abdominal isometrics    Consulted and Agree with Plan of Care  Patient       Patient will benefit from skilled therapeutic intervention in order to improve the following deficits and impairments:  Increased fascial restricitons, Decreased mobility, Decreased scar mobility, Postural dysfunction, Decreased activity tolerance, Decreased range of motion, Decreased strength, Impaired flexibility  Visit Diagnosis: Muscle weakness (generalized)  Other lack of coordination     Problem List Patient Active Problem List   Diagnosis Date Noted  . History of transient ischemic attack (TIA) 07/16/2017  . GERD (gastroesophageal reflux disease) 07/16/2017  . Partial small bowel obstruction ([place]) 07/16/2017  . SBO (small bowel obstruction) ([place]) 05/29/2017  . Right radial head fracture 06/17/2016  . Hypertension 05/30/2015  . Hyperlipidemia 05/30/2015  . Recurrent ventral incisional hernia s/p lap repairs 2013, 2016, 2017 10/25/2014  . Anxiety 10/20/2014  . Irritable bowel syndrome 10/20/2014  . Interstitial cystitis 03/15/2013  . Lichen planus 95/18/8416  . PVC's (premature ventricular contractions)     Earlie Counts, PT 02/22/18 4:48 PM   [facility] [address], [place], [place], [address] Phone: [phone]   Fax:  [phone]  Name: Cynthia Peters MRN: [id number] Date of Birth: 02-27-1950

## 2018-03-02 ENCOUNTER — Ambulatory Visit: Payer: Medicare Other | Admitting: Physical Therapy

## 2018-03-08 ENCOUNTER — Encounter: Payer: Self-pay | Admitting: Physical Therapy

## 2018-03-08 ENCOUNTER — Ambulatory Visit: Payer: Medicare Other | Attending: General Surgery | Admitting: Physical Therapy

## 2018-03-08 DIAGNOSIS — M6281 Muscle weakness (generalized): Secondary | ICD-10-CM | POA: Diagnosis not present

## 2018-03-08 DIAGNOSIS — R278 Other lack of coordination: Secondary | ICD-10-CM | POA: Diagnosis present

## 2018-03-08 NOTE — Therapy (Signed)
[facility] [address], [place], [place], [address] Phone: [phone]   Fax:  [phone]  Physical Therapy Treatment  Patient Details  Name: Cynthia Peters MRN: [id number] Date of Birth: Apr 03, 1950 Referring Provider (PT): dr. Ralene Ok   Encounter Date: 03/08/2018  PT End of Session - 03/08/18 1538    Visit Number  5    Date for PT Re-Evaluation  03/15/18    Authorization Type  [employer] medicare    PT Start Time  6568    PT Stop Time  1610    PT Time Calculation (min)  40 min    Activity Tolerance  Patient tolerated treatment well    Behavior During Therapy  [facility] for tasks assessed/performed       Past Medical History:  Diagnosis Date  . Anemia    history in the past, not on a regular basis  . Arthritis   . Bronchitis   . Cataracts, bilateral    immature  . Claustrophobia    takes Valium daily as needed  . Complication of anesthesia    2013 SURGERY vocal cord bothering pt, used smaller ent tube after 2 hernia repair, no problem after . " I woke up during my MRI and colonoscopy."       . Depression    takes Wellbutrin daily  . Dry eye   . Dry mouth   . GERD (gastroesophageal reflux disease)    takes Omeprazole daily   . H/O hiatal hernia   . Headache    migraines with flashing lights   . History of blood clots 40 yrs    leg   . History of echocardiogram 2013   a. Echo (05/2011): Mild LVH, EF 65-70%, no WMA, Gr 1 DD, mild AI.  Marland Kitchen History of MRSA infection   . History of staph infection   . History of stress test 2010   a. ETT-Echo in 2010 was normal  . History of TIA (transient ischemic attack) 2013   a. Carotid US (05/2011): No ICA stenosis, pt unsure of this  . Hyperlipidemia    takes Simvastatin daily   . Hypertension    takes  Losartan daily  . IBS (irritable bowel syndrome)    takes Bentyl daily as needed  . Insomnia    takes Ambien nightly  . Interstitial cystitis   . Lichen  planus   . Mild asthma    Albuterol inhaler as needed  . Palpitations    Event monitor in 2007 with rare PVCs // takes Metoprolol daily  . PVC (premature ventricular contraction)   . Seasonal allergies    takes Claritin daily as needed.Takes Singulair daily as needed.  Marland Kitchen TIA (transient ischemic attack) 05/27/2011  . Urethral stricture   . Vertigo    takes Meclizine daily as needed    Past Surgical History:  Procedure Laterality Date  . CESAREAN SECTION    . COLONOSCOPY WITH PROPOFOL N/A 08/10/2012   Procedure: COLONOSCOPY WITH PROPOFOL;  Surgeon: Garlan Fair, MD;  Location: [facility];  Service: Endoscopy;  Laterality: N/A;  . CYSTOSCOPY WITH URETHRAL DILATATION N/A 02/18/2013   Procedure: CYSTOSCOPY WITH URETHRAL DILATATION ( NO BALLOON) AND HYDRODISTENSION;  Surgeon: Alexis Frock, MD;  Location: [facility];  Service: Urology;  Laterality: N/A;  . ENDOMETRIAL ABLATION W/ HYDROTHERMABLATOR  09-27-2002  . INCISIONAL HERNIA REPAIR N/A 10/25/2014   Procedure: HERNIA REPAIR INCISIONAL;  Surgeon: Ralene Ok, MD;  Location: [place];  Service: General;  Laterality: N/A;  . INCISIONAL HERNIA REPAIR N/A 03/26/2015   Procedure: LAPAROSCOPIC INCISIONAL HERNIA;  Surgeon: Ralene Ok, MD;  Location: [place];  Service: General;  Laterality: N/A;  . INSERTION OF MESH N/A 10/25/2014   Procedure: INSERTION OF MESH;  Surgeon: Ralene Ok, MD;  Location: [place];  Service: General;  Laterality: N/A;  . LAPAROSCOPIC LYSIS OF ADHESIONS N/A 03/26/2015   Procedure: LAPAROSCOPIC LYSIS OF ADHESIONS;  Surgeon: Ralene Ok, MD;  Location: [place];  Service: General;  Laterality: N/A;  . LAPAROTOMY N/A 10/25/2014   Procedure: EXPLORATORY LAPAROTOMY;  Surgeon: Ralene Ok, MD;  Location: [place];  Service: General;  Laterality: N/A;  . LYSIS OF ADHESION N/A 10/25/2014   Procedure: LYSIS OF ADHESIONS ;  Surgeon: Ralene Ok, MD;  Location: [place];  Service: General;  Laterality: N/A;   . MUCOSAL ADVANCEMENT FLAP N/A 10/25/2014   Procedure: MUCOSAL ADVANCEMENT FLAP;  Surgeon: Ralene Ok, MD;  Location: [place];  Service: General;  Laterality: N/A;  . ORIF RADIAL FRACTURE Right 06/17/2016   Procedure: right radial head arthroplasty with ligament repair, reconstruciton;  Surgeon: Roseanne Kaufman, MD;  Location: [place];  Service: Orthopedics;  Laterality: Right;  . RADIAL HEAD ARTHROPLASTY Right 06/17/2016  . TOOTH EXTRACTION    . TRANSTHORACIC ECHOCARDIOGRAM  05-26-2011   MILD LVH/  EF 11-94%/  GRADE I DIASTOLIC DYSFUNCTION/  MILD AR//   06-20-2008  NOMRAL STRESS ECHO  . UMBILICAL HERNIA REPAIR  2013   AND REVISION THE SAME YEAR W/ MESH  . WISDOM TOOTH EXTRACTION  AGE 88    There were no vitals filed for this visit.  Subjective Assessment - 03/08/18 1537    Subjective  I was was sick last visit so I was not able to come to therapy. I was able to button up my jeans finally.     Patient Stated Goals  Reduce the abdoment due to increased swelling    Currently in Pain?  No/denies         [facility] PT Assessment - 03/08/18 0001      Observation/Other Assessments   Observations  abdominal circumference prior to treatment was 174 cm at umbilicus and end of treatment was 110.5 cm                   [facility] Adult PT Treatment/Exercise - 03/08/18 0001      Self-Care   Self-Care  Other Self-Care Comments    Other Self-Care Comments   educated patient on walking 30 minutes per day.       Lumbar Exercises: Aerobic   Recumbent Bike  5 minutes on level 1      Lumbar Exercises: Supine   Clam  10 reps;5 seconds   with pelvic floor contraction   Isometric Hip Flexion  10 reps;5 seconds   each leg   Other Supine Lumbar Exercises  hookly press arms into mat with abdominal bracing hold 5 sec 10x    Other Supine Lumbar Exercises  hookly ball squeeze with pelvic floor contraction holding 5 sec 10x      Manual Therapy   Manual Therapy  Myofascial release    Myofascial  Release  release 3 planes of fascia for the diaphgram with less tenderness afterwards; release of bilateral lower quadrant to release the fascia             PT Education - 03/08/18 1559    Education Details  Access Code: [email or web address]  Person(s) Educated  Patient    Methods  Explanation;Demonstration;Verbal cues;Handout    Comprehension  Returned demonstration;Verbalized understanding       PT Short Term Goals - 03/08/18 1616      PT SHORT TERM GOAL #3   Title  abilty to brace her abdominals with equal contraction of the upper and lower abdominals    Time  4    Period  Weeks    Status  Achieved      PT SHORT TERM GOAL #4   Title  educated on different corsets to use for abdominal support for daily activities    Time  4    Period  Weeks    Status  Achieved        PT Long Term Goals - 03/08/18 1616      PT LONG TERM GOAL #1   Title  independent with HEP and understand how to progress herself    Time  8    Period  Weeks    Status  On-going      PT LONG TERM GOAL #2   Title  pelvic floor strength >/= 4/5 so she is able to contract her pelvic floor when laughing and sneezing without urine leakage    Time  8    Period  Weeks    Status  On-going      PT LONG TERM GOAL #3   Title  reduction of fascial restrictions in the abdomen so she is able to perform an isometric contraction correctly while she is lifting item </= 20#    Time  8    Period  Weeks    Status  On-going            Plan - 03/08/18 1543    Clinical Impression Statement  Patient reports it is easier to button her pants. Patient has increased softness of the abdomen. Patient able to urinate with larger stream. Patient still has to rush due to when she goes to sit down and urine comes out. Patient No urinary leakage with sneezing and coughing unless she has an urge to urinate a the same time. Patient will benefit from skilled therapy to improve pelvic floor strength while releasing fascial restriction.      Rehab Potential  Excellent    Clinical Impairments Affecting Rehab Potential  urinary leakage with sneezing and coughing, difficulty with contracting abdominals for daily tasks    PT Frequency  1x / week    PT Duration  8 weeks    PT Treatment/Interventions  Biofeedback;Therapeutic activities;Therapeutic exercise;Neuromuscular re-education;Manual techniques;Patient/family education;Scar mobilization;Dry needling;Passive range of motion;Taping    PT Next Visit Plan  soft tissue work to the pelvic floor and abdomen,   umbilicus soft tissue work for home; abdominal isometrics; write renewal    PT Home Exercise Plan  Access Code: [email or web address]     Consulted and Agree with Plan of Care  Patient       Patient will benefit from skilled therapeutic intervention in order to improve the following deficits and impairments:  Increased fascial restricitons, Decreased mobility, Decreased scar mobility, Postural dysfunction, Decreased activity tolerance, Decreased range of motion, Decreased strength, Impaired flexibility  Visit Diagnosis: Muscle weakness (generalized)  Other lack of coordination     Problem List Patient Active Problem List   Diagnosis Date Noted  . History of transient ischemic attack (TIA) 07/16/2017  . GERD (gastroesophageal reflux disease) 07/16/2017  . Partial small bowel obstruction ([place]) 07/16/2017  . SBO (small bowel obstruction) (  [place]) 05/29/2017  . Right radial head fracture 06/17/2016  . Hypertension 05/30/2015  . Hyperlipidemia 05/30/2015  . Recurrent ventral incisional hernia s/p lap repairs 2013, 2016, 2017 10/25/2014  . Anxiety 10/20/2014  . Irritable bowel syndrome 10/20/2014  . Interstitial cystitis 03/15/2013  . Lichen planus 34/96/1164  . PVC's (premature ventricular contractions)     Earlie Counts, PT 03/08/18 4:18 PM   [facility] [address], [place], [place], [address] Phone: [phone]    Fax:  [phone]  Name: Cynthia Peters MRN: [id number] Date of Birth: Apr 15, 1950

## 2018-03-08 NOTE — Patient Instructions (Signed)
Access Code: [email or web address]  URL: [email or web address]  Date: 03/08/2018  Prepared by: Earlie Counts   Exercises  Hooklying Isometric Hip Flexion - 10 reps - 1 sets - 1x daily - 7x weekly  Supine Transversus Abdominis Bracing - Hands on Ground - 10 reps - 1 sets - hold 5 sec hold - 1x daily - 7x weekly  Supine Hip Adduction Isometric with Ball - 10 reps - 1 sets - hold 5 sec hold - 1x daily - 7x weekly  Hooklying Isometric Clamshell - 10 reps - 1 sets - 5 sec hold - 1x daily - 7x weekly  [facility] [address] [place], [place] [address] Phone # [phone] Fax [phone]

## 2018-03-15 ENCOUNTER — Ambulatory Visit: Payer: Medicare Other | Admitting: Physical Therapy

## 2018-03-15 ENCOUNTER — Encounter: Payer: Self-pay | Admitting: Physical Therapy

## 2018-03-15 DIAGNOSIS — R278 Other lack of coordination: Secondary | ICD-10-CM

## 2018-03-15 DIAGNOSIS — M6281 Muscle weakness (generalized): Secondary | ICD-10-CM

## 2018-03-15 NOTE — Patient Instructions (Addendum)
Slow Contraction: Gravity Eliminated (Hook-Lying)    Lie with hips and knees bent. Slowly squeeze pelvic floor for _5__ seconds. Rest for __5_ seconds. Repeat __10_ times. Do _3__ times a day.   Copyright  VHI. All rights reserved.  [place] [address] [place], [place] [address] Phone # [phone] Fax [phone]

## 2018-03-15 NOTE — Therapy (Signed)
[facility] [address] [place], [place], [address] Phone: [phone]   Fax:  [phone]  Physical Therapy Treatment  Patient Details  Name: Cynthia Peters MRN: [id number] Date of Birth: 03/31/1950 Referring Provider (PT): dr. Ralene Ok   Encounter Date: 03/15/2018  PT End of Session - 03/15/18 1533    Visit Number  6    Date for PT Re-Evaluation  05/10/18    Authorization Type  [employer] medicare    PT Start Time  3614    PT Stop Time  1610    PT Time Calculation (min)  40 min    Activity Tolerance  Patient tolerated treatment well    Behavior During Therapy  [facility] for tasks assessed/performed       Past Medical History:  Diagnosis Date  . Anemia    history in the past, not on a regular basis  . Arthritis   . Bronchitis   . Cataracts, bilateral    immature  . Claustrophobia    takes Valium daily as needed  . Complication of anesthesia    2013 SURGERY vocal cord bothering pt, used smaller ent tube after 2 hernia repair, no problem after . " I woke up during my MRI and colonoscopy."       . Depression    takes Wellbutrin daily  . Dry eye   . Dry mouth   . GERD (gastroesophageal reflux disease)    takes Omeprazole daily   . H/O hiatal hernia   . Headache    migraines with flashing lights   . History of blood clots 40 yrs    leg   . History of echocardiogram 2013   a. Echo (05/2011): Mild LVH, EF 65-70%, no WMA, Gr 1 DD, mild AI.  Marland Kitchen History of MRSA infection   . History of staph infection   . History of stress test 2010   a. ETT-Echo in 2010 was normal  . History of TIA (transient ischemic attack) 2013   a. Carotid US (05/2011): No ICA stenosis, pt unsure of this  . Hyperlipidemia    takes Simvastatin daily   . Hypertension    takes  Losartan daily  . IBS (irritable bowel syndrome)    takes Bentyl daily as needed  . Insomnia    takes Ambien nightly  . Interstitial cystitis   . Lichen  planus   . Mild asthma    Albuterol inhaler as needed  . Palpitations    Event monitor in 2007 with rare PVCs // takes Metoprolol daily  . PVC (premature ventricular contraction)   . Seasonal allergies    takes Claritin daily as needed.Takes Singulair daily as needed.  Marland Kitchen TIA (transient ischemic attack) 05/27/2011  . Urethral stricture   . Vertigo    takes Meclizine daily as needed    Past Surgical History:  Procedure Laterality Date  . CESAREAN SECTION    . COLONOSCOPY WITH PROPOFOL N/A 08/10/2012   Procedure: COLONOSCOPY WITH PROPOFOL;  Surgeon: Garlan Fair, MD;  [facility];  Service: Endoscopy;  Laterality: N/A;  . CYSTOSCOPY WITH URETHRAL DILATATION N/A 02/18/2013   Procedure: CYSTOSCOPY WITH URETHRAL DILATATION ( NO BALLOON) AND HYDRODISTENSION;  Surgeon: Alexis Frock, MD;  Location: [facility];  Service: Urology;  Laterality: N/A;  . ENDOMETRIAL ABLATION W/ HYDROTHERMABLATOR  09-27-2002  . INCISIONAL HERNIA REPAIR N/A 10/25/2014   Procedure: HERNIA REPAIR INCISIONAL;  Surgeon: Ralene Ok, MD;  Location: [place];  Service: General;  Laterality: N/A;  . INCISIONAL HERNIA REPAIR N/A 03/26/2015   Procedure: LAPAROSCOPIC INCISIONAL HERNIA;  Surgeon: Ralene Ok, MD;  Location: [place];  Service: General;  Laterality: N/A;  . INSERTION OF MESH N/A 10/25/2014   Procedure: INSERTION OF MESH;  Surgeon: Ralene Ok, MD;  Location: [place];  Service: General;  Laterality: N/A;  . LAPAROSCOPIC LYSIS OF ADHESIONS N/A 03/26/2015   Procedure: LAPAROSCOPIC LYSIS OF ADHESIONS;  Surgeon: Ralene Ok, MD;  Location: [place];  Service: General;  Laterality: N/A;  . LAPAROTOMY N/A 10/25/2014   Procedure: EXPLORATORY LAPAROTOMY;  Surgeon: Ralene Ok, MD;  Location: [place];  Service: General;  Laterality: N/A;  . LYSIS OF ADHESION N/A 10/25/2014   Procedure: LYSIS OF ADHESIONS ;  Surgeon: Ralene Ok, MD;  Location: [place];  Service: General;  Laterality: N/A;   . MUCOSAL ADVANCEMENT FLAP N/A 10/25/2014   Procedure: MUCOSAL ADVANCEMENT FLAP;  Surgeon: Ralene Ok, MD;  Location: [place];  Service: General;  Laterality: N/A;  . ORIF RADIAL FRACTURE Right 06/17/2016   Procedure: right radial head arthroplasty with ligament repair, reconstruciton;  Surgeon: Roseanne Kaufman, MD;  Location: [place];  Service: Orthopedics;  Laterality: Right;  . RADIAL HEAD ARTHROPLASTY Right 06/17/2016  . TOOTH EXTRACTION    . TRANSTHORACIC ECHOCARDIOGRAM  05-26-2011   MILD LVH/  EF 27-06%/  GRADE I DIASTOLIC DYSFUNCTION/  MILD AR//   06-20-2008  NOMRAL STRESS ECHO  . UMBILICAL HERNIA REPAIR  2013   AND REVISION THE SAME YEAR W/ MESH  . WISDOM TOOTH EXTRACTION  AGE 89    There were no vitals filed for this visit.  Subjective Assessment - 03/15/18 1533    Subjective  I have questions of my exercises. I have ordered my coreset. Some days my stomach is bigger and harder than others. 60% of the times abdomen is softer and 40% of the time my stomach is hard and bloated.     Patient Stated Goals  Reduce the abdoment due to increased swelling    Currently in Pain?  No/denies    Pain Score  4     Pain Location  Abdomen    Pain Orientation  Left    Pain Descriptors / Indicators  Sharp    Pain Type  Chronic pain    Pain Onset  More than a month ago    Pain Frequency  Intermittent    Aggravating Factors   standing    Pain Relieving Factors  rest    Multiple Pain Sites  No         [facility] PT Assessment - 03/15/18 0001      Assessment   Medical Diagnosis  Z98.890 Status post hernia repair    Referring Provider (PT)  dr. Ralene Ok    Onset Date/Surgical Date  03/06/14    Prior Therapy  yes for pelvic floor      Precautions   Precautions  Other (comment)    Precaution Comments  hernia repairs with mest      Restrictions   Weight Bearing Restrictions  No      [place] residence      Prior Function   Level of  Independence  Independent      Observation/Other Assessments   Observations  abdominal circumference prior to treatment was 237 cm at umbilicus and end of treatment was 110.5 cm      AROM   Lumbar Extension  decreased by  75% due to multiple hernia surgeries    Lumbar - Right Side Bend  decreased by 25%                Pelvic Floor Special Questions - 03/15/18 0001    Number of C-Sections  1    Currently Sexually Active  No    Marinoff Scale  pain prevents any attempts at intercourse    Urinary Leakage  Yes    Activities that cause leaking  Coughing;Sneezing;Laughing    Urinary frequency  every 2-3 hours    Pelvic Floor Internal Exam  Patient confirms identification and approves PT to assess the pelvic floor and treatment    Exam Type  Vaginal    Palpation  tightness on the left intoritus    Strength  fair squeeze, definite lift        [facility] Adult PT Treatment/Exercise - 03/15/18 0001      Manual Therapy   Manual Therapy  Myofascial release;Manual Lymphatic Drainage (MLD);Soft tissue mobilization;Internal Pelvic Floor    Soft tissue mobilization  circular massage to improve peristalic motion of the intestines    Myofascial Release  release in midline of the abdomen    Manual Lymphatic Drainage (MLD)  to lower abdomen, abdominal lymphatics colon stimulation, deep lymphatic abdominal stimulation    Internal Pelvic Floor  bil. obturator internist and levator ani with tacile cues to contract the pelvic floor with circular contraction and engaging the pelvic floor with a lift             PT Education - 03/15/18 1705    Education Details  pelvic floor contraction in gravity eliminated position    Person(s) Educated  Patient    Methods  Explanation;Demonstration;Verbal cues;Handout    Comprehension  Returned demonstration;Verbalized understanding       PT Short Term Goals - 03/08/18 1616      PT SHORT TERM GOAL #3   Title  abilty to brace her abdominals with equal  contraction of the upper and lower abdominals    Time  4    Period  Weeks    Status  Achieved      PT SHORT TERM GOAL #4   Title  educated on different corsets to use for abdominal support for daily activities    Time  4    Period  Weeks    Status  Achieved        PT Long Term Goals - 03/15/18 1536      PT LONG TERM GOAL #1   Title  independent with HEP and understand how to progress herself    Baseline  still learning    Time  8    Period  Weeks    Status  On-going      PT LONG TERM GOAL #2   Title  pelvic floor strength >/= 4/5 so she is able to contract her pelvic floor when laughing and sneezing without urine leakage    Baseline   trouble emptying the bladder, leak urine when bringing pants down to urinate    Time  8    Period  Weeks    Status  On-going      PT LONG TERM GOAL #3   Title  reduction of fascial restrictions in the abdomen so she is able to perform an isometric contraction correctly while she is lifting item </= 20#    Time  8    Period  Weeks    Status  On-going  Plan - 03/15/18 1708    Clinical Impression Statement  Patient urinary leakage has not changed. Patient abdominal circumference was 767 cm at the umbilicus. Patient abdomen was softer. Patient abdominal strength is 2/5. Pelvic floor strength is 3/5 after soft tissue work and able to lift the pelvic floor upward with tactile cues to lower abdomen. Patient will leak urine when she is at the commode and pulling her pants down. Patient will benefit from skilled therapy to pelvic floor strength while releasing fascial restrictions.    Rehab Potential  Excellent    Clinical Impairments Affecting Rehab Potential  urinary leakage with sneezing and coughing, difficulty with contracting abdominals for daily tasks    PT Frequency  1x / week    PT Duration  8 weeks    PT Treatment/Interventions  Biofeedback;Therapeutic activities;Therapeutic exercise;Neuromuscular re-education;Manual  techniques;Patient/family education;Scar mobilization;Dry needling;Passive range of motion;Taping    PT Next Visit Plan  soft tissue work to the pelvic floor and abdomen,   umbilicus soft tissue work for home; abdominal isometrics    PT Home Exercise Plan  Access Code: [email or web address]     Consulted and Agree with Plan of Care  Patient       Patient will benefit from skilled therapeutic intervention in order to improve the following deficits and impairments:  Increased fascial restricitons, Decreased mobility, Decreased scar mobility, Postural dysfunction, Decreased activity tolerance, Decreased range of motion, Decreased strength, Impaired flexibility  Visit Diagnosis: Muscle weakness (generalized) - Plan: PT plan of care cert/re-cert  Other lack of coordination - Plan: PT plan of care cert/re-cert     Problem List Patient Active Problem List   Diagnosis Date Noted  . History of transient ischemic attack (TIA) 07/16/2017  . GERD (gastroesophageal reflux disease) 07/16/2017  . Partial small bowel obstruction ([place]) 07/16/2017  . SBO (small bowel obstruction) ([place]) 05/29/2017  . Right radial head fracture 06/17/2016  . Hypertension 05/30/2015  . Hyperlipidemia 05/30/2015  . Recurrent ventral incisional hernia s/p lap repairs 2013, 2016, 2017 10/25/2014  . Anxiety 10/20/2014  . Irritable bowel syndrome 10/20/2014  . Interstitial cystitis 03/15/2013  . Lichen planus 28/36/6294  . PVC's (premature ventricular contractions)     Earlie Counts, PT 03/15/18 5:12 PM   [facility] [address], [place], [place], [address] Phone: [phone]   Fax:  [phone]  Name: Cynthia Peters MRN: [id number] Date of Birth: Jun 11, 1950

## 2018-03-22 ENCOUNTER — Encounter: Payer: Self-pay | Admitting: Physical Therapy

## 2018-03-22 ENCOUNTER — Ambulatory Visit: Payer: Medicare Other | Admitting: Physical Therapy

## 2018-03-22 DIAGNOSIS — R278 Other lack of coordination: Secondary | ICD-10-CM

## 2018-03-22 DIAGNOSIS — M6281 Muscle weakness (generalized): Secondary | ICD-10-CM | POA: Diagnosis not present

## 2018-03-22 NOTE — Therapy (Signed)
[facility] [address], [place], [place], [address] Phone: [phone]   Fax:  [phone]  Physical Therapy Treatment  Patient Details  Name: Cynthia Peters MRN: [id number] Date of Birth: 12-27-1950 Referring Provider (PT): dr. Ralene Ok   Encounter Date: 03/22/2018  PT End of Session - 03/22/18 1515    Visit Number  7    Date for PT Re-Evaluation  05/10/18    Authorization Type  [employer] medicare    PT Start Time  6754    PT Stop Time  1525    PT Time Calculation (min)  40 min    Activity Tolerance  Patient tolerated treatment well    Behavior During Therapy  [facility] for tasks assessed/performed       Past Medical History:  Diagnosis Date  . Anemia    history in the past, not on a regular basis  . Arthritis   . Bronchitis   . Cataracts, bilateral    immature  . Claustrophobia    takes Valium daily as needed  . Complication of anesthesia    2013 SURGERY vocal cord bothering pt, used smaller ent tube after 2 hernia repair, no problem after . " I woke up during my MRI and colonoscopy."       . Depression    takes Wellbutrin daily  . Dry eye   . Dry mouth   . GERD (gastroesophageal reflux disease)    takes Omeprazole daily   . H/O hiatal hernia   . Headache    migraines with flashing lights   . History of blood clots 40 yrs    leg   . History of echocardiogram 2013   a. Echo (05/2011): Mild LVH, EF 65-70%, no WMA, Gr 1 DD, mild AI.  Marland Kitchen History of MRSA infection   . History of staph infection   . History of stress test 2010   a. ETT-Echo in 2010 was normal  . History of TIA (transient ischemic attack) 2013   a. Carotid US (05/2011): No ICA stenosis, pt unsure of this  . Hyperlipidemia    takes Simvastatin daily   . Hypertension    takes  Losartan daily  . IBS (irritable bowel syndrome)    takes Bentyl daily as needed  . Insomnia    takes Ambien nightly  . Interstitial cystitis   . Lichen  planus   . Mild asthma    Albuterol inhaler as needed  . Palpitations    Event monitor in 2007 with rare PVCs // takes Metoprolol daily  . PVC (premature ventricular contraction)   . Seasonal allergies    takes Claritin daily as needed.Takes Singulair daily as needed.  Marland Kitchen TIA (transient ischemic attack) 05/27/2011  . Urethral stricture   . Vertigo    takes Meclizine daily as needed    Past Surgical History:  Procedure Laterality Date  . CESAREAN SECTION    . COLONOSCOPY WITH PROPOFOL N/A 08/10/2012   Procedure: COLONOSCOPY WITH PROPOFOL;  Surgeon: Garlan Fair, MD;  Location: [facility];  Service: Endoscopy;  Laterality: N/A;  . CYSTOSCOPY WITH URETHRAL DILATATION N/A 02/18/2013   Procedure: CYSTOSCOPY WITH URETHRAL DILATATION ( NO BALLOON) AND HYDRODISTENSION;  Surgeon: Alexis Frock, MD;  Location: [facility];  Service: Urology;  Laterality: N/A;  . ENDOMETRIAL ABLATION W/ HYDROTHERMABLATOR  09-27-2002  . INCISIONAL HERNIA REPAIR N/A 10/25/2014   Procedure: HERNIA REPAIR INCISIONAL;  Surgeon: Ralene Ok, MD;  Location: [place];  Service: General;  Laterality: N/A;  . INCISIONAL HERNIA REPAIR N/A 03/26/2015   Procedure: LAPAROSCOPIC INCISIONAL HERNIA;  Surgeon: Ralene Ok, MD;  Location: [place];  Service: General;  Laterality: N/A;  . INSERTION OF MESH N/A 10/25/2014   Procedure: INSERTION OF MESH;  Surgeon: Ralene Ok, MD;  Location: [place];  Service: General;  Laterality: N/A;  . LAPAROSCOPIC LYSIS OF ADHESIONS N/A 03/26/2015   Procedure: LAPAROSCOPIC LYSIS OF ADHESIONS;  Surgeon: Ralene Ok, MD;  Location: [place];  Service: General;  Laterality: N/A;  . LAPAROTOMY N/A 10/25/2014   Procedure: EXPLORATORY LAPAROTOMY;  Surgeon: Ralene Ok, MD;  Location: [place];  Service: General;  Laterality: N/A;  . LYSIS OF ADHESION N/A 10/25/2014   Procedure: LYSIS OF ADHESIONS ;  Surgeon: Ralene Ok, MD;  Location: [place];  Service: General;  Laterality: N/A;   . MUCOSAL ADVANCEMENT FLAP N/A 10/25/2014   Procedure: MUCOSAL ADVANCEMENT FLAP;  Surgeon: Ralene Ok, MD;  Location: [place];  Service: General;  Laterality: N/A;  . ORIF RADIAL FRACTURE Right 06/17/2016   Procedure: right radial head arthroplasty with ligament repair, reconstruciton;  Surgeon: Roseanne Kaufman, MD;  Location: [place];  Service: Orthopedics;  Laterality: Right;  . RADIAL HEAD ARTHROPLASTY Right 06/17/2016  . TOOTH EXTRACTION    . TRANSTHORACIC ECHOCARDIOGRAM  05-26-2011   MILD LVH/  EF 38-18%/  GRADE I DIASTOLIC DYSFUNCTION/  MILD AR//   06-20-2008  NOMRAL STRESS ECHO  . UMBILICAL HERNIA REPAIR  2013   AND REVISION THE SAME YEAR W/ MESH  . WISDOM TOOTH EXTRACTION  AGE 68    There were no vitals filed for this visit.  Subjective Assessment - 03/22/18 1446    Subjective  My corset had not come yet. My stomach has been feeling okay. I feel like the mesh is aggravating. I have had gas lately. My stomach been gurgling. In the moring I will not get to the bathroom in time.     Patient Stated Goals  Reduce the abdoment due to increased swelling    Currently in Pain?  No/denies    Multiple Pain Sites  --         [facility] PT Assessment - 03/22/18 0001      Observation/Other Assessments   Observations  abdominal circumference prior to treatment was 299 cm at umbilicus and end of treatment was 111 cm                   [facility] Adult PT Treatment/Exercise - 03/22/18 0001      Lumbar Exercises: Supine   Basic Lumbar Stabilization  20 reps;1 second   alternate shoulder flexion, ball squeeze   Basic Lumbar Stabilization Limitations  pelvic floor contraction    Isometric Hip Flexion  10 reps;5 seconds   each leg   Other Supine Lumbar Exercises  hookly press palms into balls  into mat with abdominal bracing hold 5 sec 10x    Other Supine Lumbar Exercises  hookly ball squeeze with pelvic floor contraction holding 5 sec 10x with verbal cues fo fully lift the buttocks       Manual Therapy   Manual Therapy  Myofascial release;Manual Lymphatic Drainage (MLD);Soft tissue mobilization    Soft tissue mobilization  circular massage to improve peristalic motion of the intestines    Myofascial Release  release in midline of the abdomen    Manual Lymphatic Drainage (MLD)  to lower abdomen, abdominal lymphatics colon stimulation, deep lymphatic abdominal stimulation  PT Education - 03/22/18 1528    Education Details  Access Code: [email or web address]     Methods  Explanation;Demonstration;Verbal cues;Handout    Comprehension  Verbalized understanding;Returned demonstration       PT Short Term Goals - 03/08/18 1616      PT SHORT TERM GOAL #3   Title  abilty to brace her abdominals with equal contraction of the upper and lower abdominals    Time  4    Period  Weeks    Status  Achieved      PT SHORT TERM GOAL #4   Title  educated on different corsets to use for abdominal support for daily activities    Time  4    Period  Weeks    Status  Achieved        PT Long Term Goals - 03/22/18 1517      PT LONG TERM GOAL #1   Title  independent with HEP and understand how to progress herself    Time  8    Status  On-going      PT LONG TERM GOAL #2   Title  pelvic floor strength >/= 4/5 so she is able to contract her pelvic floor when laughing and sneezing without urine leakage    Baseline   trouble emptying the bladder, leak urine when bringing pants down to urinate    Time  8    Period  Weeks    Status  On-going      PT LONG TERM GOAL #3   Title  reduction of fascial restrictions in the abdomen so she is able to perform an isometric contraction correctly while she is lifting item </= 20#    Time  8    Period  Weeks    Status  On-going            Plan - 03/22/18 1453    Clinical Impression Statement  Patient reports she has a stronger stream of urine after therapy. Patient has not change in abdominal circumference. Patient has increased tone  in abdomen. Patient will leak urine in the morning due to her taking time to go to the bathroom in the morning. Patient will benefit from skilled therapy to improve pelvic floor strength and reduce abdominal fascial restrictions.     Rehab Potential  Excellent    Clinical Impairments Affecting Rehab Potential  urinary leakage with sneezing and coughing, difficulty with contracting abdominals for daily tasks    PT Frequency  1x / week    PT Duration  8 weeks    PT Treatment/Interventions  Biofeedback;Therapeutic activities;Therapeutic exercise;Neuromuscular re-education;Manual techniques;Patient/family education;Scar mobilization;Dry needling;Passive range of motion;Taping    PT Next Visit Plan  soft tissue work to the pelvic floor and abdomen,   umbilicus soft tissue work for home; abdominal isometrics    PT Home Exercise Plan  Access Code: [email or web address]     Consulted and Agree with Plan of Care  Patient       Patient will benefit from skilled therapeutic intervention in order to improve the following deficits and impairments:  Increased fascial restricitons, Decreased mobility, Decreased scar mobility, Postural dysfunction, Decreased activity tolerance, Decreased range of motion, Decreased strength, Impaired flexibility  Visit Diagnosis: Muscle weakness (generalized)  Other lack of coordination     Problem List Patient Active Problem List   Diagnosis Date Noted  . History of transient ischemic attack (TIA) 07/16/2017  . GERD (gastroesophageal reflux disease) 07/16/2017  . Partial small bowel obstruction ([place])  07/16/2017  . SBO (small bowel obstruction) ([place]) 05/29/2017  . Right radial head fracture 06/17/2016  . Hypertension 05/30/2015  . Hyperlipidemia 05/30/2015  . Recurrent ventral incisional hernia s/p lap repairs 2013, 2016, 2017 10/25/2014  . Anxiety 10/20/2014  . Irritable bowel syndrome 10/20/2014  . Interstitial cystitis 03/15/2013  . Lichen planus 29/24/4628  . PVC's  (premature ventricular contractions)     Earlie Counts, PT 03/22/18 3:32 PM   [facility] [address], [place], [place], [address] Phone: [phone]   Fax:  [phone]  Name: Cynthia Peters MRN: [id number] Date of Birth: [birth year]

## 2018-03-22 NOTE — Patient Instructions (Signed)
Access Code: [email or web address]  URL: [email or web address]  Date: 03/22/2018  Prepared by: Earlie Counts   Exercises  Hooklying Isometric Hip Flexion - 10 reps - 1 sets - 1x daily - 7x weekly  Supine Transversus Abdominis Bracing - Hands on Ground - 10 reps - 1 sets - hold 5 sec hold - 1x daily - 7x weekly  Supine Hip Adduction Isometric with Ball - 10 reps - 1 sets - hold 5 sec hold - 1x daily - 7x weekly  Hooklying Isometric Clamshell - 10 reps - 1 sets - 5 sec hold - 1x daily - 7x weekly  Supine Alternating Shoulder Flexion - 10 reps - 1 sets - 1x daily - 7x weekly  [facility] [address] [place], [place] [address] Phone # [phone] Fax [phone]

## 2018-03-29 ENCOUNTER — Ambulatory Visit: Payer: Medicare Other | Admitting: Physical Therapy

## 2018-03-29 ENCOUNTER — Encounter: Payer: Self-pay | Admitting: Physical Therapy

## 2018-03-29 DIAGNOSIS — M6281 Muscle weakness (generalized): Secondary | ICD-10-CM | POA: Diagnosis not present

## 2018-03-29 DIAGNOSIS — R278 Other lack of coordination: Secondary | ICD-10-CM

## 2018-03-29 NOTE — Therapy (Signed)
[facility] [address] [place], [place], [address] Phone: [phone]   Fax:  [phone]  Physical Therapy Treatment  Patient Details  Name: Cynthia Peters MRN: [id number] Date of Birth: [birth year] Referring Provider (PT): dr. Ralene Ok   Encounter Date: 03/29/2018  PT End of Session - 03/29/18 1452    Visit Number  8    Date for PT Re-Evaluation  05/10/18    Authorization Type  [employer] medicare    PT Start Time  3154    PT Stop Time  1525    PT Time Calculation (min)  40 min    Activity Tolerance  Patient tolerated treatment well    Behavior During Therapy  [facility] for tasks assessed/performed       Past Medical History:  Diagnosis Date  . Anemia    history in the past, not on a regular basis  . Arthritis   . Bronchitis   . Cataracts, bilateral    immature  . Claustrophobia    takes Valium daily as needed  . Complication of anesthesia    2013 SURGERY vocal cord bothering pt, used smaller ent tube after 2 hernia repair, no problem after . " I woke up during my MRI and colonoscopy."       . Depression    takes Wellbutrin daily  . Dry eye   . Dry mouth   . GERD (gastroesophageal reflux disease)    takes Omeprazole daily   . H/O hiatal hernia   . Headache    migraines with flashing lights   . History of blood clots 40 yrs    leg   . History of echocardiogram 2013   a. Echo (05/2011): Mild LVH, EF 65-70%, no WMA, Gr 1 DD, mild AI.  Marland Kitchen History of MRSA infection   . History of staph infection   . History of stress test 2010   a. ETT-Echo in 2010 was normal  . History of TIA (transient ischemic attack) 2013   a. Carotid US (05/2011): No ICA stenosis, pt unsure of this  . Hyperlipidemia    takes Simvastatin daily   . Hypertension    takes  Losartan daily  . IBS (irritable bowel syndrome)    takes Bentyl daily as needed  . Insomnia    takes Ambien nightly  . Interstitial cystitis   . Lichen  planus   . Mild asthma    Albuterol inhaler as needed  . Palpitations    Event monitor in 2007 with rare PVCs // takes Metoprolol daily  . PVC (premature ventricular contraction)   . Seasonal allergies    takes Claritin daily as needed.Takes Singulair daily as needed.  Marland Kitchen TIA (transient ischemic attack) 05/27/2011  . Urethral stricture   . Vertigo    takes Meclizine daily as needed    Past Surgical History:  Procedure Laterality Date  . CESAREAN SECTION    . COLONOSCOPY WITH PROPOFOL N/A 08/10/2012   Procedure: COLONOSCOPY WITH PROPOFOL;  Surgeon: Garlan Fair, MD;  Location: [facility];  Service: Endoscopy;  Laterality: N/A;  . CYSTOSCOPY WITH URETHRAL DILATATION N/A 02/18/2013   Procedure: CYSTOSCOPY WITH URETHRAL DILATATION ( NO BALLOON) AND HYDRODISTENSION;  Surgeon: Alexis Frock, MD;  Location: [facility];  Service: Urology;  Laterality: N/A;  . ENDOMETRIAL ABLATION W/ HYDROTHERMABLATOR  09-27-2002  . INCISIONAL HERNIA REPAIR N/A 10/25/2014   Procedure: HERNIA REPAIR INCISIONAL;  Surgeon: Ralene Ok, MD;  Location: [place];  Service: General;  Laterality: N/A;  . INCISIONAL HERNIA REPAIR N/A 03/26/2015   Procedure: LAPAROSCOPIC INCISIONAL HERNIA;  Surgeon: Ralene Ok, MD;  Location: [place];  Service: General;  Laterality: N/A;  . INSERTION OF MESH N/A 10/25/2014   Procedure: INSERTION OF MESH;  Surgeon: Ralene Ok, MD;  Location: [place];  Service: General;  Laterality: N/A;  . LAPAROSCOPIC LYSIS OF ADHESIONS N/A 03/26/2015   Procedure: LAPAROSCOPIC LYSIS OF ADHESIONS;  Surgeon: Ralene Ok, MD;  Location: [place];  Service: General;  Laterality: N/A;  . LAPAROTOMY N/A 10/25/2014   Procedure: EXPLORATORY LAPAROTOMY;  Surgeon: Ralene Ok, MD;  Location: [place];  Service: General;  Laterality: N/A;  . LYSIS OF ADHESION N/A 10/25/2014   Procedure: LYSIS OF ADHESIONS ;  Surgeon: Ralene Ok, MD;  Location: [place];  Service: General;  Laterality: N/A;   . MUCOSAL ADVANCEMENT FLAP N/A 10/25/2014   Procedure: MUCOSAL ADVANCEMENT FLAP;  Surgeon: Ralene Ok, MD;  Location: [place];  Service: General;  Laterality: N/A;  . ORIF RADIAL FRACTURE Right 06/17/2016   Procedure: right radial head arthroplasty with ligament repair, reconstruciton;  Surgeon: Roseanne Kaufman, MD;  Location: [place];  Service: Orthopedics;  Laterality: Right;  . RADIAL HEAD ARTHROPLASTY Right 06/17/2016  . TOOTH EXTRACTION    . TRANSTHORACIC ECHOCARDIOGRAM  05-26-2011   MILD LVH/  EF 59-16%/  GRADE I DIASTOLIC DYSFUNCTION/  MILD AR//   06-20-2008  NOMRAL STRESS ECHO  . UMBILICAL HERNIA REPAIR  2013   AND REVISION THE SAME YEAR W/ MESH  . WISDOM TOOTH EXTRACTION  AGE 68    There were no vitals filed for this visit.  Subjective Assessment - 03/29/18 1450    Subjective  I have had more urgency this weak. I feel like I am not finishing the urination. I feel like the IC is getting stirred up.     Patient Stated Goals  Reduce the abdoment due to increased swelling    Currently in Pain?  Yes    Pain Score  4     Pain Location  Abdomen    Pain Orientation  Lower    Pain Descriptors / Indicators  --   bloating   Pain Type  Chronic pain    Pain Onset  More than a month ago    Pain Frequency  Intermittent    Aggravating Factors   standing    Pain Relieving Factors  rest    Multiple Pain Sites  No                       [facility] Adult PT Treatment/Exercise - 03/29/18 0001      Neuro Re-ed    Neuro Re-ed Details   sit on physioball- boucing, side to side, circles, reach overhead to the side, lean back to engage the abdominals      Lumbar Exercises: Supine   Isometric Hip Flexion  10 reps;5 seconds   each leg   Other Supine Lumbar Exercises  hookly ball squeeze with pelvic floor contraction holding 5 sec 10x with verbal cues fo fully lift the buttocks      Manual Therapy   Manual Therapy  Myofascial release    Manual therapy comments  sitting on ball  performing tissue release suprapubically and along the diaphgram    Myofascial Release  release in midline of the abdomen; relase suprapubic area; along c-section scar  PT Short Term Goals - 03/08/18 1616      PT SHORT TERM GOAL #3   Title  abilty to brace her abdominals with equal contraction of the upper and lower abdominals    Time  4    Period  Weeks    Status  Achieved      PT SHORT TERM GOAL #4   Title  educated on different corsets to use for abdominal support for daily activities    Time  4    Period  Weeks    Status  Achieved        PT Long Term Goals - 03/29/18 1454      PT LONG TERM GOAL #1   Title  independent with HEP and understand how to progress herself    Baseline  still learning    Period  Weeks    Status  On-going      PT LONG TERM GOAL #2   Title  pelvic floor strength >/= 4/5 so she is able to contract her pelvic floor when laughing and sneezing without urine leakage    Baseline   trouble emptying the bladder, leak urine when bringing pants down to urinate    Time  8    Period  Weeks    Status  On-going      PT LONG TERM GOAL #3   Title  reduction of fascial restrictions in the abdomen so she is able to perform an isometric contraction correctly while she is lifting item </= 20#    Time  8    Period  Weeks    Status  On-going            Plan - 03/29/18 1453    Clinical Impression Statement  Today patient was having more bladder irritation. Patient is not leaking urine with laughing and sneezing. Patient is having increased urge to urinate  past few days. Patient is able to contract the lower abdominals better. Patient has increased softness o fthe abdomen after manual work. Patient felt like she was not emptying her bladder fully the past few days. Patient will benefit from skilled therapy to improve pelvic floor strength and reduce  abdominal fascial restrictions.     Rehab Potential  Excellent    Clinical Impairments  Affecting Rehab Potential  urinary leakage with sneezing and coughing, difficulty with contracting abdominals for daily tasks    PT Frequency  1x / week    PT Duration  8 weeks    PT Treatment/Interventions  Biofeedback;Therapeutic activities;Therapeutic exercise;Neuromuscular re-education;Manual techniques;Patient/family education;Scar mobilization;Dry needling;Passive range of motion;Taping    PT Next Visit Plan  soft tissue work to the pelvic floor and abdomen,   umbilicus soft tissue work for home; abdominal isometrics; work internal work around the bladder    PT Home Exercise Plan  Access Code: [email or web address]     Consulted and Agree with Plan of Care  Patient       Patient will benefit from skilled therapeutic intervention in order to improve the following deficits and impairments:  Increased fascial restricitons, Decreased mobility, Decreased scar mobility, Postural dysfunction, Decreased activity tolerance, Decreased range of motion, Decreased strength, Impaired flexibility  Visit Diagnosis: Muscle weakness (generalized)  Other lack of coordination     Problem List Patient Active Problem List   Diagnosis Date Noted  . History of transient ischemic attack (TIA) 07/16/2017  . GERD (gastroesophageal reflux disease) 07/16/2017  . Partial small bowel obstruction ([place]) 07/16/2017  . SBO (small bowel  obstruction) ([place]) 05/29/2017  . Right radial head fracture 06/17/2016  . Hypertension 05/30/2015  . Hyperlipidemia 05/30/2015  . Recurrent ventral incisional hernia s/p lap repairs 2013, 2016, 2017 10/25/2014  . Anxiety 10/20/2014  . Irritable bowel syndrome 10/20/2014  . Interstitial cystitis 03/15/2013  . Lichen planus 94/85/4627  . PVC's (premature ventricular contractions)     Earlie Counts, PT 03/29/18 3:28 PM   [facility] [address] [place], [place], [address] Phone: [phone]   Fax:  [phone]  Name:  ANAKA BEAZER MRN: [id number] Date of Birth: Feb 21, 1950

## 2018-04-05 ENCOUNTER — Ambulatory Visit: Payer: Medicare Other | Attending: General Surgery | Admitting: Physical Therapy

## 2018-04-05 DIAGNOSIS — M6281 Muscle weakness (generalized): Secondary | ICD-10-CM | POA: Insufficient documentation

## 2018-04-05 DIAGNOSIS — R278 Other lack of coordination: Secondary | ICD-10-CM | POA: Diagnosis present

## 2018-04-05 NOTE — Therapy (Signed)
[facility] [address] [place], [place], [address] Phone: [phone]   Fax:  [phone]  Physical Therapy Treatment  Patient Details  Name: DELYNDA SEPULVEDA MRN: [id number] Date of Birth: 67/23/1952 Referring Provider (PT): dr. Ralene Ok   Encounter Date: 04/05/2018  PT End of Session - 04/05/18 1528    Visit Number  9    Date for PT Re-Evaluation  05/10/18    PT Start Time  1445    PT Stop Time  1528    PT Time Calculation (min)  43 min    Activity Tolerance  Patient tolerated treatment well       Past Medical History:  Diagnosis Date  . Anemia    history in the past, not on a regular basis  . Arthritis   . Bronchitis   . Cataracts, bilateral    immature  . Claustrophobia    takes Valium daily as needed  . Complication of anesthesia    2013 SURGERY vocal cord bothering pt, used smaller ent tube after 2 hernia repair, no problem after . " I woke up during my MRI and colonoscopy."       . Depression    takes Wellbutrin daily  . Dry eye   . Dry mouth   . GERD (gastroesophageal reflux disease)    takes Omeprazole daily   . H/O hiatal hernia   . Headache    migraines with flashing lights   . History of blood clots 40 yrs    leg   . History of echocardiogram 2013   a. Echo (05/2011): Mild LVH, EF 65-70%, no WMA, Gr 1 DD, mild AI.  Marland Kitchen History of MRSA infection   . History of staph infection   . History of stress test 2010   a. ETT-Echo in 2010 was normal  . History of TIA (transient ischemic attack) 2013   a. Carotid US (05/2011): No ICA stenosis, pt unsure of this  . Hyperlipidemia    takes Simvastatin daily   . Hypertension    takes  Losartan daily  . IBS (irritable bowel syndrome)    takes Bentyl daily as needed  . Insomnia    takes Ambien nightly  . Interstitial cystitis   . Lichen planus   . Mild asthma    Albuterol inhaler as needed  . Palpitations    Event monitor in 2007  with rare PVCs // takes Metoprolol daily  . PVC (premature ventricular contraction)   . Seasonal allergies    takes Claritin daily as needed.Takes Singulair daily as needed.  Marland Kitchen TIA (transient ischemic attack) 05/27/2011  . Urethral stricture   . Vertigo    takes Meclizine daily as needed    Past Surgical History:  Procedure Laterality Date  . CESAREAN SECTION    . COLONOSCOPY WITH PROPOFOL N/A 08/10/2012   Procedure: COLONOSCOPY WITH PROPOFOL;  Surgeon: Garlan Fair, MD;  Location: [facility];  Service: Endoscopy;  Laterality: N/A;  . CYSTOSCOPY WITH URETHRAL DILATATION N/A 02/18/2013   Procedure: CYSTOSCOPY WITH URETHRAL DILATATION ( NO BALLOON) AND HYDRODISTENSION;  Surgeon: Alexis Frock, MD;  Location: [facility];  Service: Urology;  Laterality: N/A;  . ENDOMETRIAL ABLATION W/ HYDROTHERMABLATOR  09-27-2002  . INCISIONAL HERNIA REPAIR N/A 10/25/2014   Procedure: HERNIA REPAIR INCISIONAL;  Surgeon: Ralene Ok, MD;  Location: [place];  Service: General;  Laterality: N/A;  . INCISIONAL HERNIA REPAIR N/A 03/26/2015   Procedure: [place];  Surgeon: Ralene Ok, MD;  Location: [place];  Service: General;  Laterality: N/A;  . INSERTION OF MESH N/A 10/25/2014   Procedure: INSERTION OF MESH;  Surgeon: Ralene Ok, MD;  Location: [place];  Service: General;  Laterality: N/A;  . LAPAROSCOPIC LYSIS OF ADHESIONS N/A 03/26/2015   Procedure: LAPAROSCOPIC LYSIS OF ADHESIONS;  Surgeon: Ralene Ok, MD;  Location: [place];  Service: General;  Laterality: N/A;  . LAPAROTOMY N/A 10/25/2014   Procedure: EXPLORATORY LAPAROTOMY;  Surgeon: Ralene Ok, MD;  Location: [place];  Service: General;  Laterality: N/A;  . LYSIS OF ADHESION N/A 10/25/2014   Procedure: LYSIS OF ADHESIONS ;  Surgeon: Ralene Ok, MD;  Location: [place];  Service: General;  Laterality: N/A;  . MUCOSAL ADVANCEMENT FLAP N/A 10/25/2014   Procedure: MUCOSAL ADVANCEMENT FLAP;  Surgeon:  Ralene Ok, MD;  Location: [place];  Service: General;  Laterality: N/A;  . ORIF RADIAL FRACTURE Right 06/17/2016   Procedure: right radial head arthroplasty with ligament repair, reconstruciton;  Surgeon: Roseanne Kaufman, MD;  Location: [place];  Service: Orthopedics;  Laterality: Right;  . RADIAL HEAD ARTHROPLASTY Right 06/17/2016  . TOOTH EXTRACTION    . TRANSTHORACIC ECHOCARDIOGRAM  05-26-2011   MILD LVH/  EF 75-10%/  GRADE I DIASTOLIC DYSFUNCTION/  MILD AR//   06-20-2008  NOMRAL STRESS ECHO  . UMBILICAL HERNIA REPAIR  2013   AND REVISION THE SAME YEAR W/ MESH  . WISDOM TOOTH EXTRACTION  AGE 4    There were no vitals filed for this visit.  Subjective Assessment - 04/05/18 1500    Subjective  I still feel like I do not fully empty my bladder. My stomach can get so hard.     Patient Stated Goals  Reduce the abdoment due to increased swelling    Currently in Pain?  No/denies    Multiple Pain Sites  No         [facility] PT Assessment - 04/05/18 0001      Assessment   Medical Diagnosis  Z98.890 Status post hernia repair    Referring Provider (PT)  dr. Ralene Ok    Onset Date/Surgical Date  03/06/14    Prior Therapy  yes for pelvic floor      Precautions   Precautions  Other (comment)    Precaution Comments  hernia repairs with mest      Restrictions   Weight Bearing Restrictions  No      [place] residence      Prior Function   Level of Independence  Independent      Cognition   Overall Cognitive Status  Within Functional Limits for tasks assessed                   [facility] Adult PT Treatment/Exercise - 04/05/18 0001      Lumbar Exercises: Aerobic   Nustep  level 2, seat #8, arm #6, 8 min      Lumbar Exercises: Supine   Bridge  10 reps;1 second    Bridge Limitations  bridge with moving hips side to side 10x    Basic Lumbar Stabilization  20 reps;1 second   alternate shoulder flexion, ball squeeze   Isometric  Hip Flexion  10 reps;5 seconds   each leg   Other Supine Lumbar Exercises  hookly press palms into balls  into mat with abdominal bracing hold 5 sec 10x      Manual Therapy   Manual Therapy  Myofascial release    Myofascial Release  release in midline of the abdomen; relase suprapubic area; along c-section scar               PT Short Term Goals - 03/08/18 1616      PT SHORT TERM GOAL #3   Title  abilty to brace her abdominals with equal contraction of the upper and lower abdominals    Time  4    Period  Weeks    Status  Achieved      PT SHORT TERM GOAL #4   Title  educated on different corsets to use for abdominal support for daily activities    Time  4    Period  Weeks    Status  Achieved        PT Long Term Goals - 04/05/18 1456      PT LONG TERM GOAL #1   Title  independent with HEP and understand how to progress herself    Baseline  still learning    Time  8    Period  Weeks    Status  On-going      PT LONG TERM GOAL #2   Title  pelvic floor strength >/= 4/5 so she is able to contract her pelvic floor when laughing and sneezing without urine leakage    Baseline   trouble emptying the bladder, leak urine when bringing pants down to urinate    Time  8    Period  Weeks    Status  On-going      PT LONG TERM GOAL #3   Title  reduction of fascial restrictions in the abdomen so she is able to perform an isometric contraction correctly while she is lifting item </= 20#    Time  8    Period  Weeks    Status  On-going            Plan - 04/05/18 1454    Clinical Impression Statement  Patient still has difficulty with fully emptying her bladder and does not always have a strong stream. Patient sometimes has difficulty with contracting the abdominals with arm movements. Patient abdomen is sometimes soft and sometimes firm. Patient has increased tissue mobility of the abdmen. Patient will benefit from skilled therapy  to improve pelvic floor strength and reduce  abdominal fascial restrictions.     Rehab Potential  Excellent    Clinical Impairments Affecting Rehab Potential  urinary leakage with sneezing and coughing, difficulty with contracting abdominals for daily tasks    PT Frequency  1x / week    PT Duration  8 weeks    PT Treatment/Interventions  Biofeedback;Therapeutic activities;Therapeutic exercise;Neuromuscular re-education;Manual techniques;Patient/family education;Scar mobilization;Dry needling;Passive range of motion;Taping    PT Next Visit Plan  soft tissue work to the pelvic floor and abdomen,   umbilicus soft tissue work for home; abdominal isometrics; work internal work around the bladder    PT Home Exercise Plan  Access Code: [email or web address]     Recommended Other Services  MD signed initial and renewal note    Consulted and Agree with Plan of Care  Patient       Patient will benefit from skilled therapeutic intervention in order to improve the following deficits and impairments:  Increased fascial restricitons, Decreased mobility, Decreased scar mobility, Postural dysfunction, Decreased activity tolerance, Decreased range of motion, Decreased strength, Impaired flexibility  Visit Diagnosis: Muscle weakness (generalized)  Other lack of coordination     Problem List Patient  Active Problem List   Diagnosis Date Noted  . History of transient ischemic attack (TIA) 07/16/2017  . GERD (gastroesophageal reflux disease) 07/16/2017  . Partial small bowel obstruction ([place]) 07/16/2017  . SBO (small bowel obstruction) ([place]) 05/29/2017  . Right radial head fracture 06/17/2016  . Hypertension 05/30/2015  . Hyperlipidemia 05/30/2015  . Recurrent ventral incisional hernia s/p lap repairs 2013, 2016, 2017 10/25/2014  . Anxiety 10/20/2014  . Irritable bowel syndrome 10/20/2014  . Interstitial cystitis 03/15/2013  . Lichen planus 06/77/0340  . PVC's (premature ventricular contractions)     Earlie Counts, PT 04/05/18 3:32 PM   [facility] [address] [place], [place], [address] Phone: [phone]   Fax:  [phone]  Name: LOISTINE EBERLIN MRN: [id number] Date of Birth: Feb 10, 1950

## 2018-04-12 ENCOUNTER — Encounter: Payer: Self-pay | Admitting: Physical Therapy

## 2018-04-12 ENCOUNTER — Ambulatory Visit: Payer: Medicare Other | Admitting: Physical Therapy

## 2018-04-12 DIAGNOSIS — M6281 Muscle weakness (generalized): Secondary | ICD-10-CM

## 2018-04-12 DIAGNOSIS — R278 Other lack of coordination: Secondary | ICD-10-CM

## 2018-04-12 NOTE — Therapy (Addendum)
[facility] [address], [place], [place], [address] Phone: [phone]   Fax:  [phone]  Physical Therapy Treatment  Patient Details  Name: Cynthia Peters MRN: [id number] Date of Birth: 1950/10/13 Referring Provider (PT): dr. Ralene Ok   Encounter Date: 04/12/2018  Progress Note Reporting Period 01/19/2019 to 04/12/2018  See note below for Objective Data and Assessment of Progress/Goals.       PT End of Session - 04/12/18 1452    Visit Number  10    Date for PT Re-Evaluation  05/10/18    Authorization Type  [employer] medicare    PT Start Time  1454   due to being late and going to the bathroom   PT Stop Time  1525    PT Time Calculation (min)  31 min    Activity Tolerance  Patient tolerated treatment well    Behavior During Therapy  WFL for tasks assessed/performed       Past Medical History:  Diagnosis Date  . Anemia    history in the past, not on a regular basis  . Arthritis   . Bronchitis   . Cataracts, bilateral    immature  . Claustrophobia    takes Valium daily as needed  . Complication of anesthesia    2013 SURGERY vocal cord bothering pt, used smaller ent tube after 2 hernia repair, no problem after . " I woke up during my MRI and colonoscopy."       . Depression    takes Wellbutrin daily  . Dry eye   . Dry mouth   . GERD (gastroesophageal reflux disease)    takes Omeprazole daily   . H/O hiatal hernia   . Headache    migraines with flashing lights   . History of blood clots 40 yrs    leg   . History of echocardiogram 2013   a. Echo (05/2011): Mild LVH, EF 65-70%, no WMA, Gr 1 DD, mild AI.  Marland Kitchen History of MRSA infection   . History of staph infection   . History of stress test 2010   a. ETT-Echo in 2010 was normal  . History of TIA (transient ischemic attack) 2013   a. Carotid US (05/2011): No ICA stenosis, pt unsure of this  . Hyperlipidemia    takes Simvastatin daily   .  Hypertension    takes  Losartan daily  . IBS (irritable bowel syndrome)    takes Bentyl daily as needed  . Insomnia    takes Ambien nightly  . Interstitial cystitis   . Lichen planus   . Mild asthma    Albuterol inhaler as needed  . Palpitations    Event monitor in 2007 with rare PVCs // takes Metoprolol daily  . PVC (premature ventricular contraction)   . Seasonal allergies    takes Claritin daily as needed.Takes Singulair daily as needed.  Marland Kitchen TIA (transient ischemic attack) 05/27/2011  . Urethral stricture   . Vertigo    takes Meclizine daily as needed    Past Surgical History:  Procedure Laterality Date  . CESAREAN SECTION    . COLONOSCOPY WITH PROPOFOL N/A 08/10/2012   Procedure: COLONOSCOPY WITH PROPOFOL;  Surgeon: Garlan Fair, MD;  [facility]: [facility];  Service: Endoscopy;  Laterality: N/A;  . CYSTOSCOPY WITH URETHRAL DILATATION N/A 02/18/2013   Procedure: CYSTOSCOPY WITH URETHRAL DILATATION ( NO BALLOON) AND HYDRODISTENSION;  Surgeon: Alexis Frock, MD;  Location: [facility];  Service: Urology;  Laterality: N/A;  . ENDOMETRIAL ABLATION W/ HYDROTHERMABLATOR  09-27-2002  . INCISIONAL HERNIA REPAIR N/A 10/25/2014   Procedure: HERNIA REPAIR INCISIONAL;  Surgeon: Ralene Ok, MD;  Location: [place];  Service: General;  Laterality: N/A;  . INCISIONAL HERNIA REPAIR N/A 03/26/2015   Procedure: LAPAROSCOPIC INCISIONAL HERNIA;  Surgeon: Ralene Ok, MD;  Location: [place];  Service: General;  Laterality: N/A;  . INSERTION OF MESH N/A 10/25/2014   Procedure: INSERTION OF MESH;  Surgeon: Ralene Ok, MD;  Location: [place];  Service: General;  Laterality: N/A;  . LAPAROSCOPIC LYSIS OF ADHESIONS N/A 03/26/2015   Procedure: LAPAROSCOPIC LYSIS OF ADHESIONS;  Surgeon: Ralene Ok, MD;  Location: [place];  Service: General;  Laterality: N/A;  . LAPAROTOMY N/A 10/25/2014   Procedure: EXPLORATORY LAPAROTOMY;  Surgeon: Ralene Ok, MD;  Location: [place];  Service:  General;  Laterality: N/A;  . LYSIS OF ADHESION N/A 10/25/2014   Procedure: LYSIS OF ADHESIONS ;  Surgeon: Ralene Ok, MD;  Location: [place];  Service: General;  Laterality: N/A;  . MUCOSAL ADVANCEMENT FLAP N/A 10/25/2014   Procedure: MUCOSAL ADVANCEMENT FLAP;  Surgeon: Ralene Ok, MD;  Location: [place];  Service: General;  Laterality: N/A;  . ORIF RADIAL FRACTURE Right 06/17/2016   Procedure: right radial head arthroplasty with ligament repair, reconstruciton;  Surgeon: Roseanne Kaufman, MD;  Location: [place];  Service: Orthopedics;  Laterality: Right;  . RADIAL HEAD ARTHROPLASTY Right 06/17/2016  . TOOTH EXTRACTION    . TRANSTHORACIC ECHOCARDIOGRAM  05-26-2011   MILD LVH/  EF 26-94%/  GRADE I DIASTOLIC DYSFUNCTION/  MILD AR//   06-20-2008  NOMRAL STRESS ECHO  . UMBILICAL HERNIA REPAIR  2013   AND REVISION THE SAME YEAR W/ MESH  . WISDOM TOOTH EXTRACTION  AGE 68    There were no vitals filed for this visit.  Subjective Assessment - 04/12/18 1456    Subjective  My legs are weak feeling but not sure why. No changes since last visit.     Patient Stated Goals  Reduce the abdoment due to increased swelling    Currently in Pain?  No/denies         [facility] PT Assessment - 04/12/18 0001      Assessment   Medical Diagnosis  Z98.890 Status post hernia repair    Referring Provider (PT)  dr. Ralene Ok    Onset Date/Surgical Date  03/06/14    Prior Therapy  yes for pelvic floor      Precautions   Precautions  Other (comment)    Precaution Comments  hernia repairs with mest      Restrictions   Weight Bearing Restrictions  No      [place] residence      Prior Function   Level of Independence  Independent      Cognition   Overall Cognitive Status  Within Functional Limits for tasks assessed      Posture/Postural Control   Posture/Postural Control  Postural limitations    Postural Limitations  Rounded Shoulders;Forward head      AROM    Lumbar Extension  decreased by 50%    Lumbar - Right Side Bend  full                   [facility] Adult PT Treatment/Exercise - 04/12/18 0001      Lumbar Exercises: Aerobic   Stationary Bike  level 3 for 5 min   while assessing patient  Manual Therapy   Manual Therapy  Myofascial release    Myofascial Release  release in midline of the abdomen; relase suprapubic area; along c-section scar               PT Short Term Goals - 03/08/18 1616      PT SHORT TERM GOAL #3   Title  abilty to brace her abdominals with equal contraction of the upper and lower abdominals    Time  4    Period  Weeks    Status  Achieved      PT SHORT TERM GOAL #4   Title  educated on different corsets to use for abdominal support for daily activities    Time  4    Period  Weeks    Status  Achieved        PT Long Term Goals - 04/12/18 1506      PT LONG TERM GOAL #1   Title  independent with HEP and understand how to progress herself    Baseline  still learning    Time  8    Period  Weeks    Status  On-going      PT LONG TERM GOAL #2   Title  pelvic floor strength >/= 4/5 so she is able to contract her pelvic floor when laughing and sneezing without urine leakage    Baseline  this weak able to empty the bladder this week,  leak urine when bringing pants down to urinate    Time  8    Period  Weeks    Status  On-going      PT LONG TERM GOAL #3   Title  reduction of fascial restrictions in the abdomen so she is able to perform an isometric contraction correctly while she is lifting item </= 20#    Time  8    Period  Weeks    Status  On-going            Plan - 04/12/18 1455    Clinical Impression Statement  Patient understands how to use vaginal moisturizers for vaginal health. Patient adomen feels like it is bloated more since soft tissue work. Patient has fascial restrictions during fascial work. Patient has not had difficulty with emptying her bladder this week.  Patient needs more isometric strength of the abdomen. Patient has difficulty with cardio due to her other ailments. Patient abdomen is sometimes firm and soft. Today the abdomen felt soft. Patient reports no urinary leakage this week. Patient will benefit from skilled therapy to improve pelvic floor strength and reduce abdominal fascial restrictions.     Rehab Potential  Excellent    Clinical Impairments Affecting Rehab Potential  urinary leakage with sneezing and coughing, difficulty with contracting abdominals for daily tasks    PT Frequency  1x / week    PT Duration  8 weeks    PT Treatment/Interventions  Biofeedback;Therapeutic activities;Therapeutic exercise;Neuromuscular re-education;Manual techniques;Patient/family education;Scar mobilization;Dry needling;Passive range of motion;Taping    PT Next Visit Plan  soft tissue work to the pelvic floor;  abdominal isometrics; work internal work around the bladder    PT Home Exercise Plan  Access Code: [email or web address]     [email or web address] and Agree with Plan of Care  Patient       Patient will benefit from skilled therapeutic intervention in order to improve the following deficits and impairments:  Increased fascial restricitons, Decreased mobility, Decreased scar mobility, Postural dysfunction, Decreased activity tolerance, Decreased range of motion,  Decreased strength, Impaired flexibility  Visit Diagnosis: Muscle weakness (generalized)  Other lack of coordination     Problem List Patient Active Problem List   Diagnosis Date Noted  . History of transient ischemic attack (TIA) 07/16/2017  . GERD (gastroesophageal reflux disease) 07/16/2017  . Partial small bowel obstruction ([place]) 07/16/2017  . SBO (small bowel obstruction) ([place]) 05/29/2017  . Right radial head fracture 06/17/2016  . Hypertension 05/30/2015  . Hyperlipidemia 05/30/2015  . Recurrent ventral incisional hernia s/p lap repairs 2013, 2016, 2017 10/25/2014  . Anxiety 10/20/2014  .  Irritable bowel syndrome 10/20/2014  . Interstitial cystitis 03/15/2013  . Lichen planus 51/83/3582  . PVC's (premature ventricular contractions)     Earlie Counts, PT 04/12/18 3:40 PM   [facility] [address], [place], [place], [address] Phone: [phone]   Fax:  [phone]  Name: Cynthia Peters MRN: [id number] Date of Birth: [birth year]  PHYSICAL THERAPY DISCHARGE SUMMARY  Visits from Start of Care: 10  Current functional level related to goals / functional outcomes: See above. Patient treatment was interrupted with COVID-19. Called patient on 06/08/2018 to see if she would like to return but no return phone call.    Remaining deficits: See above.    Education / Equipment: HEP Plan:                                                    Patient goals were partially met. Patient is being discharged due to not returning since the last visit.  Thank you for the referral. Earlie Counts, PT 06/10/18 2:42 PM  ?????

## 2018-04-19 ENCOUNTER — Ambulatory Visit: Payer: Medicare Other | Admitting: Physical Therapy

## 2018-07-14 ENCOUNTER — Other Ambulatory Visit: Payer: Self-pay

## 2018-07-14 MED ORDER — METOPROLOL SUCCINATE ER 100 MG PO TB24: 100.0000 mg | ORAL_TABLET | Freq: Two times a day (BID) | ORAL | 1 refills | Status: AC

## 2018-08-25 ENCOUNTER — Other Ambulatory Visit: Payer: Self-pay | Admitting: Cardiology

## 2018-08-25 DIAGNOSIS — I493 Ventricular premature depolarization: Secondary | ICD-10-CM

## 2018-11-17 ENCOUNTER — Other Ambulatory Visit: Payer: Self-pay

## 2018-11-17 ENCOUNTER — Emergency Department (HOSPITAL_COMMUNITY)
Admission: EM | Admit: 2018-11-17 | Discharge: 2018-11-18 | Disposition: A | Discharge status: Home / Self Care | Payer: Medicare Other | Attending: Emergency Medicine | Admitting: Emergency Medicine

## 2018-11-17 ENCOUNTER — Emergency Department (HOSPITAL_COMMUNITY): Payer: Medicare Other

## 2018-11-17 ENCOUNTER — Encounter (HOSPITAL_COMMUNITY): Payer: Self-pay | Admitting: Emergency Medicine

## 2018-11-17 DIAGNOSIS — I10 Essential (primary) hypertension: Secondary | ICD-10-CM | POA: Insufficient documentation

## 2018-11-17 DIAGNOSIS — R1084 Generalized abdominal pain: Secondary | ICD-10-CM

## 2018-11-17 DIAGNOSIS — Z87891 Personal history of nicotine dependence: Secondary | ICD-10-CM | POA: Diagnosis not present

## 2018-11-17 DIAGNOSIS — R1013 Epigastric pain: Secondary | ICD-10-CM | POA: Diagnosis present

## 2018-11-17 LAB — URINALYSIS, ROUTINE W REFLEX MICROSCOPIC
Bilirubin Urine: NEGATIVE
Glucose, UA: NEGATIVE mg/dL
Hgb urine dipstick: NEGATIVE
Ketones, ur: NEGATIVE mg/dL
Nitrite: NEGATIVE
Protein, ur: NEGATIVE mg/dL
Specific Gravity, Urine: 1.024 (ref 1.005–1.030)
pH: 6 (ref 5.0–8.0)

## 2018-11-17 LAB — COMPREHENSIVE METABOLIC PANEL
ALT: 29 U/L (ref 0–44)
AST: 29 U/L (ref 15–41)
Albumin: 4.4 g/dL (ref 3.5–5.0)
Alkaline Phosphatase: 64 U/L (ref 38–126)
Anion gap: 13 (ref 5–15)
BUN: 17 mg/dL (ref 8–23)
CO2: 26 mmol/L (ref 22–32)
Calcium: 10.1 mg/dL (ref 8.9–10.3)
Chloride: 99 mmol/L (ref 98–111)
Creatinine, Ser: 1.01 mg/dL — ABNORMAL HIGH (ref 0.44–1.00)
GFR calc Af Amer: >60 mL/min (ref >60)
GFR calc non Af Amer: 58 mL/min — ABNORMAL LOW (ref >60)
Glucose, Bld: 126 mg/dL — ABNORMAL HIGH (ref 70–99)
Potassium: 4.2 mmol/L (ref 3.5–5.1)
Sodium: 138 mmol/L (ref 135–145)
Total Bilirubin: 1.9 mg/dL — ABNORMAL HIGH (ref 0.3–1.2)
Total Protein: 6.9 g/dL (ref 6.5–8.1)

## 2018-11-17 LAB — CBC
HCT: 44.8 % (ref 36.0–46.0)
Hemoglobin: 15.7 g/dL — ABNORMAL HIGH (ref 12.0–15.0)
MCH: 33.8 pg (ref 26.0–34.0)
MCHC: 35 g/dL (ref 30.0–36.0)
MCV: 96.3 fL (ref 80.0–100.0)
Platelets: 262 10*3/uL (ref 150–400)
RBC: 4.65 MIL/uL (ref 3.87–5.11)
RDW: 12.3 % (ref 11.5–15.5)
WBC: 13 10*3/uL — ABNORMAL HIGH (ref 4.0–10.5)
nRBC: 0 % (ref 0.0–0.2)

## 2018-11-17 LAB — LIPASE, BLOOD: Lipase: 28 U/L (ref 11–51)

## 2018-11-17 MED ORDER — IOHEXOL 300 MG/ML  SOLN
100.0000 mL | Freq: Once | INTRAMUSCULAR | Status: AC | PRN
  Administered 2018-11-17: 100 mL via INTRAVENOUS

## 2018-11-17 MED ORDER — SODIUM CHLORIDE 0.9% FLUSH: 3.0000 mL | Freq: Once | INTRAVENOUS | Status: DC

## 2018-11-17 NOTE — ED Provider Notes (Signed)
[facility] Emergency Department Provider Note MRN:  [id number]  Arrival date & time: 11/17/18     Chief Complaint   Abdominal Pain   History of Present Illness   Cynthia Peters is a 68 y.o. year-old female with a history of hernia presenting to the ED with chief complaint of abdominal pain.  Location: Epigastric Duration: Lasted several hours Onset: Gradual Timing: Constant Description: Sharp Severity: Moderate to severe Exacerbating/Alleviating Factors: None Associated Symptoms: None Pertinent Negatives: Denies fever, no nausea, no vomiting, no stool changes, no chest pain, shortness of breath   Review of Systems  A complete 10 system review of systems was obtained and all systems are negative except as noted in the HPI and PMH.   Patient's Health History    Past Medical History:  Diagnosis Date  . Anemia    history in the past, not on a regular basis  . Arthritis   . Bronchitis   . Cataracts, bilateral    immature  . Claustrophobia    takes Valium daily as needed  . Complication of anesthesia    2013 SURGERY vocal cord bothering pt, used smaller ent tube after 2 hernia repair, no problem after . " I woke up during my MRI and colonoscopy."       . Depression    takes Wellbutrin daily  . Dry eye   . Dry mouth   . GERD (gastroesophageal reflux disease)    takes Omeprazole daily   . H/O hiatal hernia   . Headache    migraines with flashing lights   . History of blood clots 40 yrs    leg   . History of echocardiogram 2013   a. Echo (05/2011): Mild LVH, EF 65-70%, no WMA, Gr 1 DD, mild AI.  Marland Kitchen History of MRSA infection   . History of staph infection   . History of stress test 2010   a. ETT-Echo in 2010 was normal  . History of TIA (transient ischemic attack) 2013   a. Carotid US (05/2011): No ICA stenosis, pt unsure of this  . Hyperlipidemia    takes Simvastatin daily   . Hypertension    takes  Losartan daily  . IBS (irritable  bowel syndrome)    takes Bentyl daily as needed  . Insomnia    takes Ambien nightly  . Interstitial cystitis   . Lichen planus   . Mild asthma    Albuterol inhaler as needed  . Palpitations    Event monitor in 2007 with rare PVCs // takes Metoprolol daily  . PVC (premature ventricular contraction)   . Seasonal allergies    takes Claritin daily as needed.Takes Singulair daily as needed.  Marland Kitchen TIA (transient ischemic attack) 05/27/2011  . Urethral stricture   . Vertigo    takes Meclizine daily as needed    Past Surgical History:  Procedure Laterality Date  . CESAREAN SECTION    . COLONOSCOPY WITH PROPOFOL N/A 08/10/2012   Procedure: COLONOSCOPY WITH PROPOFOL;  Surgeon: Garlan Fair, MD;  Location: [facility];  Service: Endoscopy;  Laterality: N/A;  . CYSTOSCOPY WITH URETHRAL DILATATION N/A 02/18/2013   Procedure: CYSTOSCOPY WITH URETHRAL DILATATION ( NO BALLOON) AND HYDRODISTENSION;  Surgeon: Alexis Frock, MD;  Location: [facility];  Service: Urology;  Laterality: N/A;  . ENDOMETRIAL ABLATION W/ HYDROTHERMABLATOR  09-27-2002  . INCISIONAL HERNIA REPAIR N/A 10/25/2014   Procedure: HERNIA REPAIR INCISIONAL;  Surgeon: Ralene Ok, MD;  Location: [place];  Service:  General;  Laterality: N/A;  . INCISIONAL HERNIA REPAIR N/A 03/26/2015   Procedure: LAPAROSCOPIC INCISIONAL HERNIA;  Surgeon: Ralene Ok, MD;  Location: [employer];  Service: General;  Laterality: N/A;  . INSERTION OF MESH N/A 10/25/2014   Procedure: INSERTION OF MESH;  Surgeon: Ralene Ok, MD;  Location: [place];  Service: General;  Laterality: N/A;  . LAPAROSCOPIC LYSIS OF ADHESIONS N/A 03/26/2015   Procedure: LAPAROSCOPIC LYSIS OF ADHESIONS;  Surgeon: Ralene Ok, MD;  Location: [place];  Service: General;  Laterality: N/A;  . LAPAROTOMY N/A 10/25/2014   Procedure: EXPLORATORY LAPAROTOMY;  Surgeon: Ralene Ok, MD;  Location: [place];  Service: General;  Laterality: N/A;  . LYSIS OF ADHESION N/A  10/25/2014   Procedure: LYSIS OF ADHESIONS ;  Surgeon: Ralene Ok, MD;  Location: [place];  Service: General;  Laterality: N/A;  . MUCOSAL ADVANCEMENT FLAP N/A 10/25/2014   Procedure: MUCOSAL ADVANCEMENT FLAP;  Surgeon: Ralene Ok, MD;  Location: [place];  Service: General;  Laterality: N/A;  . ORIF RADIAL FRACTURE Right 06/17/2016   Procedure: right radial head arthroplasty with ligament repair, reconstruciton;  Surgeon: Roseanne Kaufman, MD;  Location: [place];  Service: Orthopedics;  Laterality: Right;  . RADIAL HEAD ARTHROPLASTY Right 06/17/2016  . TOOTH EXTRACTION    . TRANSTHORACIC ECHOCARDIOGRAM  05-26-2011   MILD LVH/  EF [id number]  GRADE I DIASTOLIC DYSFUNCTION/  MILD AR//   06-20-2008  NOMRAL STRESS ECHO  . UMBILICAL HERNIA REPAIR  2013   AND REVISION THE SAME YEAR W/ MESH  . WISDOM TOOTH EXTRACTION  AGE 69    Family History  Problem Relation Age of Onset  . Dementia Mother   . Leukemia Child   . Cirrhosis Father     Social History   Socioeconomic History  . Marital status: Divorced    Spouse name: Not on file  . Number of children: 2  . Years of education: Not on file  . Highest education level: Not on file  Occupational History  . Occupation: Pharmacist, hospital  Social Needs  . Financial resource strain: Not on file  . Food insecurity    Worry: Not on file    Inability: Not on file  . Transportation needs    Medical: Not on file    Non-medical: Not on file  Tobacco Use  . Smoking status: Former Smoker    Packs/day: 0.25    Years: 2.00    Pack years: 0.50    Types: Cigarettes    Quit date: 02/04/1976    Years since quitting: 42.8  . Smokeless tobacco: Never Used  Substance and Sexual Activity  . Alcohol use: Yes    Comment: occasional  . Drug use: No  . Sexual activity: Not on file  Lifestyle  . Physical activity    Days per week: Not on file    Minutes per session: Not on file  . Stress: Not on file  Relationships  . Social Herbalist on phone: Not on  file    Gets together: Not on file    Attends religious service: Not on file    Active member of club or organization: Not on file    Attends meetings of clubs or organizations: Not on file    Relationship status: Not on file  . Intimate partner violence    Fear of current or ex partner: Not on file    Emotionally abused: Not on file    Physically abused: Not on file  Forced sexual activity: Not on file  Other Topics Concern  . Not on file  Social History Narrative  . Not on file     Physical Exam  Vital Signs and Nursing Notes reviewed Vitals:   11/17/18 1604 11/17/18 1840  BP: (!) 138/107 137/68  Pulse: 68 (!) 58  Resp: 18 18  Temp: 98.6 F (37 C)   SpO2: 95% 100%    CONSTITUTIONAL: Well-appearing, NAD NEURO:  Alert and oriented x 3, no focal deficits EYES:  eyes equal and reactive ENT/NECK:  no LAD, no JVD CARDIO: Regular rate, well-perfused, normal S1 and S2 PULM:  CTAB no wheezing or rhonchi GI/GU:  normal bowel sounds, non-distended, non-tender MSK/SPINE:  No gross deformities, no edema SKIN:  no rash, atraumatic PSYCH:  Appropriate speech and behavior  Diagnostic and Interventional Summary    Labs Reviewed  COMPREHENSIVE METABOLIC PANEL - Abnormal; Notable for the following components:      Result Value   Glucose, Bld 126 (*)    Creatinine, Ser 1.01 (*)    Total Bilirubin 1.9 (*)    GFR calc non Af Amer 58 (*)    All other components within normal limits  CBC - Abnormal; Notable for the following components:   WBC 13.0 (*)    Hemoglobin 15.7 (*)    All other components within normal limits  URINALYSIS, ROUTINE W REFLEX MICROSCOPIC - Abnormal; Notable for the following components:   Leukocytes,Ua TRACE (*)    Bacteria, UA RARE (*)    All other components within normal limits  LIPASE, BLOOD    CT ABDOMEN PELVIS W CONTRAST    (Results Pending)    Medications  sodium chloride flush (NS) 0.9 % injection 3 mL (has no administration in time range)      Procedures Critical Care  ED Course and Medical Decision Making  I have reviewed the triage vital signs and the nursing notes.  Pertinent labs & imaging results that were available during my care of the patient were reviewed by me and considered in my medical decision making (see below for details).  Complicated surgical abdominal history with hernias and SBO's.  Had severe pain earlier, but resolved while in the waiting room.  No pain for the past 2 hours.  Labs largely reassuring, mild leukocytosis.  Shared decision making utilized with patient.  Options including imaging today to exclude acute process or close observation and discharge.  Patient prefers imaging, CT is pending.  Signed out to oncoming provider at shift change.   Barth Kirks. Sedonia Small, [place] [email or web address]  Final Clinical Impressions(s) / ED Diagnoses     ICD-10-CM   1. Epigastric pain  R10.13     ED Discharge Orders    None      Discharge Instructions Discussed with and Provided to Patient: Discharge Instructions   None       Maudie Flakes, MD 11/17/18 2304

## 2018-11-17 NOTE — ED Notes (Signed)
Called CT to let them know pt is ready for CT

## 2018-11-17 NOTE — ED Notes (Signed)
Patient transported to CT 

## 2018-11-17 NOTE — ED Triage Notes (Signed)
Pt with c/o left lower abdominal pain with nausea. Denies vomiting. Hx of bowel obstructions and hernia repair.

## 2018-11-18 MED ORDER — OMEPRAZOLE 20 MG PO CPDR: 20.0000 mg | DELAYED_RELEASE_CAPSULE | Freq: Every day | ORAL | 0 refills | Status: DC

## 2018-11-18 MED ORDER — ONDANSETRON 4 MG PO TBDP: 4.0000 mg | ORAL_TABLET | Freq: Three times a day (TID) | ORAL | 0 refills | Status: DC | PRN

## 2018-11-18 NOTE — Discharge Instructions (Addendum)
Your seen today for abdominal pain.  Your CT scan just shows some inflammation of the bowel which can sometimes indicate enteritis.  Start omeprazole.  Take Zofran as needed.  Follow-up with your gastroenterologist.

## 2018-11-18 NOTE — ED Provider Notes (Signed)
Patient signed out pending CT scan.  CT scan with evidence of enteritis but otherwise reassuring.  Patient is comfortable appearing without abdominal pain.  Results for orders placed or performed during the hospital encounter of 11/17/18  Lipase, blood  Result Value Ref Range   Lipase 28 11 - 51 U/L  Comprehensive metabolic panel  Result Value Ref Range   Sodium 138 135 - 145 mmol/L   Potassium 4.2 3.5 - 5.1 mmol/L   Chloride 99 98 - 111 mmol/L   CO2 26 22 - 32 mmol/L   Glucose, Bld 126 (H) 70 - 99 mg/dL   BUN 17 8 - 23 mg/dL   Creatinine, Ser 1.01 (H) 0.44 - 1.00 mg/dL   Calcium 10.1 8.9 - 10.3 mg/dL   Total Protein 6.9 6.5 - 8.1 g/dL   Albumin 4.4 3.5 - 5.0 g/dL   AST 29 15 - 41 U/L   ALT 29 0 - 44 U/L   Alkaline Phosphatase 64 38 - 126 U/L   Total Bilirubin 1.9 (H) 0.3 - 1.2 mg/dL   GFR calc non Af Amer 58 (L) >60 mL/min   GFR calc Af Amer >60 >60 mL/min   Anion gap 13 5 - 15  CBC  Result Value Ref Range   WBC 13.0 (H) 4.0 - 10.5 K/uL   RBC 4.65 3.87 - 5.11 MIL/uL   Hemoglobin 15.7 (H) 12.0 - 15.0 g/dL   HCT 44.8 36.0 - 46.0 %   MCV 96.3 80.0 - 100.0 fL   MCH 33.8 26.0 - 34.0 pg   MCHC 35.0 30.0 - 36.0 g/dL   RDW 12.3 11.5 - 15.5 %   Platelets 262 150 - 400 K/uL   nRBC 0.0 0.0 - 0.2 %  Urinalysis, Routine w reflex microscopic  Result Value Ref Range   Color, Urine YELLOW YELLOW   APPearance CLEAR CLEAR   Specific Gravity, Urine 1.024 1.005 - 1.030   pH 6.0 5.0 - 8.0   Glucose, UA NEGATIVE NEGATIVE mg/dL   Hgb urine dipstick NEGATIVE NEGATIVE   Bilirubin Urine NEGATIVE NEGATIVE   Ketones, ur NEGATIVE NEGATIVE mg/dL   Protein, ur NEGATIVE NEGATIVE mg/dL   Nitrite NEGATIVE NEGATIVE   Leukocytes,Ua TRACE (A) NEGATIVE   RBC / HPF 0-5 0 - 5 RBC/hpf   WBC, UA 0-5 0 - 5 WBC/hpf   Bacteria, UA RARE (A) NONE SEEN   Squamous Epithelial / LPF 0-5 0 - 5   Mucus PRESENT    Ct Abdomen Pelvis W Contrast  Result Date: 11/18/2018 CLINICAL DATA:  Abdominal pain near hernia,  rule out obstruction EXAM: CT ABDOMEN AND PELVIS WITH CONTRAST TECHNIQUE: Multidetector CT imaging of the abdomen and pelvis was performed using the standard protocol following bolus administration of intravenous contrast. CONTRAST:  156mL OMNIPAQUE IOHEXOL 300 MG/ML  SOLN COMPARISON:  Abdominal radiograph July 17, 2017, CT abdomen pelvis July 16, 2017 FINDINGS: Lower chest: Lung bases are clear. Normal heart size. No pericardial effusion. Hepatobiliary: Multilobulated 2.5 cm hepatic cyst in the right hepatic dome is unchanged from comparison exam. Additional 1 cm cyst along the falciform ligament and several scattered solid or subcentimeter hypoattenuating foci are also unchanged. No concerning hepatic lesions. No calcified gallstones, pericholecystic inflammation biliary ductal dilatation. Pancreas: Unremarkable. No pancreatic ductal dilatation or surrounding inflammatory changes. Spleen: Normal in size without focal abnormality. Small accessory splenule Adrenals/Urinary Tract: Adrenal glands are unremarkable. Kidneys are normal, without renal calculi, focal lesion, or hydronephrosis. No extravasation of contrast is seen on excretory  phase delayed imaging. Bladder is unremarkable. Stomach/Bowel: Distal esophagus, stomach and duodenal sweep are unremarkable. There is a focally thickened segment of small bowel in the mid abdomen with adjacent mesenteric hazy stranding (coronal 6/46, axial 3/39). No resulting obstruction. More distal small bowel has a normal appearance. Portions of distal small bowel protrude into a ventral diastasis a without resulting mechanical obstruction or evidence of strangulation. A normal appendix is visualized. No colonic dilatation or wall thickening. Vascular/Lymphatic: Atherosclerotic plaque within the normal caliber aorta. Few reactive appearing mid mesenteric lymph nodes. Reproductive: Anteverted uterus. No concerning adnexal lesions. Other: Ventral diastasis noted in the low anterior  abdomen. Likely postsurgical changes of the anterior abdominal wall from low vertical midline incision. Small fat containing right inguinal hernia. No frank abdominal wall hernia. Musculoskeletal: No acute osseous abnormality or suspicious osseous lesion. Grade 1 anterolisthesis L4 on L5, stable. Multilevel degenerative changes are present in the imaged portions of the spine. Features most pronounced L4-S1. IMPRESSION: 1. Focally thickened segment of small bowel in the mid abdomen with adjacent mesenteric hazy stranding, suggestive of enteritis, which could be infectious or inflammatory in etiology. No resulting obstruction or evidence of strangulation. 2. Broad-based ventral diastasis in the low anterior abdomen. Several loops of small bowel protrude into this diastatic segment but without resulting obstruction. 3. With a small fat containing right inguinal hernia. No frank abdominal wall hernia. 4. Aortic Atherosclerosis (ICD10-I70.0). 5. Stable hepatic cysts. Electronically Signed   By: Lovena Le M.D.   On: 11/18/2018 00:08    Physical Exam  BP (!) 146/80   Pulse 71   Temp 98.6 F (37 C) (Oral)   Resp 18   SpO2 98%   Physical Exam  Comfortable appearing No tenderness to palpation ED Course/Procedures     Procedures  MDM   We will discharge with omeprazole, Zofran, and have her follow-up with gastroenterology.       Merryl Hacker, MD 11/18/18 [phone]

## 2018-11-18 NOTE — ED Notes (Signed)
Patient verbalizes understanding of discharge instructions. Opportunity for questioning and answers were provided. Armband removed by staff, pt discharged from ED.  

## 2018-12-02 ENCOUNTER — Other Ambulatory Visit: Payer: Self-pay

## 2018-12-02 DIAGNOSIS — I493 Ventricular premature depolarization: Secondary | ICD-10-CM

## 2018-12-06 ENCOUNTER — Other Ambulatory Visit: Payer: Self-pay

## 2018-12-06 DIAGNOSIS — I493 Ventricular premature depolarization: Secondary | ICD-10-CM

## 2018-12-06 MED ORDER — POTASSIUM CHLORIDE CRYS ER 20 MEQ PO TBCR: 30.0000 meq | EXTENDED_RELEASE_TABLET | Freq: Every day | ORAL | 0 refills | Status: DC

## 2019-03-09 ENCOUNTER — Other Ambulatory Visit: Payer: Self-pay | Admitting: Cardiology

## 2019-03-09 DIAGNOSIS — I493 Ventricular premature depolarization: Secondary | ICD-10-CM

## 2019-03-15 ENCOUNTER — Ambulatory Visit: Payer: Medicare PPO

## 2019-03-17 ENCOUNTER — Ambulatory Visit: Payer: Medicare Other

## 2019-04-01 ENCOUNTER — Other Ambulatory Visit: Payer: Self-pay | Admitting: Gastroenterology

## 2019-04-01 ENCOUNTER — Ambulatory Visit
Admission: RE | Admit: 2019-04-01 | Discharge: 2019-04-01 | Disposition: A | Discharge status: Home / Self Care | Payer: Medicare PPO | Source: Ambulatory Visit | Attending: Gastroenterology | Admitting: Gastroenterology

## 2019-04-01 DIAGNOSIS — R1084 Generalized abdominal pain: Secondary | ICD-10-CM

## 2019-04-01 DIAGNOSIS — R110 Nausea: Secondary | ICD-10-CM

## 2019-04-03 ENCOUNTER — Other Ambulatory Visit: Payer: Self-pay

## 2019-04-03 ENCOUNTER — Emergency Department (HOSPITAL_BASED_OUTPATIENT_CLINIC_OR_DEPARTMENT_OTHER)
Admission: EM | Admit: 2019-04-03 | Discharge: 2019-04-03 | Disposition: A | Discharge status: Home / Self Care | Payer: Medicare PPO | Attending: Emergency Medicine | Admitting: Emergency Medicine

## 2019-04-03 ENCOUNTER — Encounter (HOSPITAL_BASED_OUTPATIENT_CLINIC_OR_DEPARTMENT_OTHER): Payer: Self-pay

## 2019-04-03 DIAGNOSIS — Z79899 Other long term (current) drug therapy: Secondary | ICD-10-CM | POA: Diagnosis not present

## 2019-04-03 DIAGNOSIS — Z7982 Long term (current) use of aspirin: Secondary | ICD-10-CM | POA: Diagnosis not present

## 2019-04-03 DIAGNOSIS — H538 Other visual disturbances: Secondary | ICD-10-CM | POA: Diagnosis present

## 2019-04-03 DIAGNOSIS — Z87891 Personal history of nicotine dependence: Secondary | ICD-10-CM | POA: Insufficient documentation

## 2019-04-03 DIAGNOSIS — J45909 Unspecified asthma, uncomplicated: Secondary | ICD-10-CM | POA: Insufficient documentation

## 2019-04-03 DIAGNOSIS — I10 Essential (primary) hypertension: Secondary | ICD-10-CM | POA: Diagnosis not present

## 2019-04-03 NOTE — Discharge Instructions (Addendum)
Follow-up with your eye doctor on this matter as soon as possible. You may also benefit from nonemergent imaging studies.  Speak with your primary care provider on this matter.

## 2019-04-03 NOTE — ED Notes (Signed)
ED Provider at bedside. 

## 2019-04-03 NOTE — ED Triage Notes (Signed)
Pt has a hx of dry eyes. When pt awoke this AM pt had severely dry eyes. Pt applied "systaine" eye drops and then pt started having a "cross eyed" feeling and blurred vision. Pt has had symptoms before without a dx. Symptoms lasted 5 minutes and is not currently present.

## 2019-04-03 NOTE — ED Provider Notes (Signed)
[facility] EMERGENCY DEPARTMENT Provider Note   CSN: [id number] Arrival date & time: 04/03/19  1214     History Chief Complaint  Patient presents with  . Eye Problem    Cynthia Peters is a 69 y.o. female.  HPI      Cynthia Peters is a 69 y.o. female, with a history of chronic dry eyes, GERD, HTN, hyperlipidemia, cataracts, presenting to the ED with an episode of her eyes "going out of focus" for approximately 3 minutes while sitting on the toilet around 10:30 AM this morning. She states this is happened to her before.  It is similar to what happens when she is on the computer for a long period of time. She states she has chronically dry eyes and had just putting rewetting drops into her eyes.  These were not new drops for her.  She notes when she covered one of her eyes, the vision in the other eye was clear. She does wear contacts occasionally, but was not wearing them this morning. She has been tested for Sjogren's and this testing was found to be negative. Her symptoms resolved and have not recurred. She states, " I figured I would just come in since I would not be able to see my eye doctor today."  [facility] follows with [facility]. She denies fever/chills, recent illness, headache, eye pain, trauma, dizziness, facial swelling/pain, extremity weakness, numbness, facial droop, syncope, or any other complaints.     Past Medical History:  Diagnosis Date  . Anemia    history in the past, not on a regular basis  . Arthritis   . Bronchitis   . Cataracts, bilateral    immature  . Claustrophobia    takes Valium daily as needed  . Complication of anesthesia    2013 SURGERY vocal cord bothering pt, used smaller ent tube after 2 hernia repair, no problem after . " I woke up during my MRI and colonoscopy."       . Depression    takes Wellbutrin daily  . Dry eye   . Dry mouth   . GERD (gastroesophageal reflux disease)    takes Omeprazole daily   . H/O  hiatal hernia   . Headache    migraines with flashing lights   . History of blood clots 40 yrs    leg   . History of echocardiogram 2013   a. Echo (05/2011): Mild LVH, EF 65-70%, no WMA, Gr 1 DD, mild AI.  Marland Kitchen History of MRSA infection   . History of staph infection   . History of stress test 2010   a. ETT-Echo in 2010 was normal  . History of TIA (transient ischemic attack) 2013   a. Carotid US (05/2011): No ICA stenosis, pt unsure of this  . Hyperlipidemia    takes Simvastatin daily   . Hypertension    takes  Losartan daily  . IBS (irritable bowel syndrome)    takes Bentyl daily as needed  . Insomnia    takes Ambien nightly  . Interstitial cystitis   . Lichen planus   . Mild asthma    Albuterol inhaler as needed  . Palpitations    Event monitor in 2007 with rare PVCs // takes Metoprolol daily  . PVC (premature ventricular contraction)   . Seasonal allergies    takes Claritin daily as needed.Takes Singulair daily as needed.  Marland Kitchen TIA (transient ischemic attack) 05/27/2011  . Urethral stricture   . Vertigo  takes Meclizine daily as needed    Patient Active Problem List   Diagnosis Date Noted  . History of transient ischemic attack (TIA) 07/16/2017  . GERD (gastroesophageal reflux disease) 07/16/2017  . Partial small bowel obstruction ([place]) 07/16/2017  . SBO (small bowel obstruction) ([place]) 05/29/2017  . Right radial head fracture 06/17/2016  . Hypertension 05/30/2015  . Hyperlipidemia 05/30/2015  . Recurrent ventral incisional hernia s/p lap repairs 2013, 2016, 2017 10/25/2014  . Anxiety 10/20/2014  . Irritable bowel syndrome 10/20/2014  . Interstitial cystitis 03/15/2013  . Lichen planus [id number]  . PVC's (premature ventricular contractions)     Past Surgical History:  Procedure Laterality Date  . CESAREAN SECTION    . COLONOSCOPY WITH PROPOFOL N/A 08/10/2012   Procedure: COLONOSCOPY WITH PROPOFOL;  Surgeon: Garlan Fair, MD;  Location: [facility];  Service:  Endoscopy;  Laterality: N/A;  . CYSTOSCOPY WITH URETHRAL DILATATION N/A 02/18/2013   Procedure: CYSTOSCOPY WITH URETHRAL DILATATION ( NO BALLOON) AND HYDRODISTENSION;  Surgeon: Alexis Frock, MD;  Location: [facility];  Service: Urology;  Laterality: N/A;  . ENDOMETRIAL ABLATION W/ HYDROTHERMABLATOR  09-27-2002  . INCISIONAL HERNIA REPAIR N/A 10/25/2014   Procedure: HERNIA REPAIR INCISIONAL;  Surgeon: Ralene Ok, MD;  Location: [facility];  Service: General;  Laterality: N/A;  . INCISIONAL HERNIA REPAIR N/A 03/26/2015   Procedure: LAPAROSCOPIC INCISIONAL HERNIA;  Surgeon: Ralene Ok, MD;  Location: [facility];  Service: General;  Laterality: N/A;  . INSERTION OF MESH N/A 10/25/2014   Procedure: INSERTION OF MESH;  Surgeon: Ralene Ok, MD;  Location: [facility];  Service: General;  Laterality: N/A;  . LAPAROSCOPIC LYSIS OF ADHESIONS N/A 03/26/2015   Procedure: LAPAROSCOPIC LYSIS OF ADHESIONS;  Surgeon: Ralene Ok, MD;  Location: [facility];  Service: General;  Laterality: N/A;  . LAPAROTOMY N/A 10/25/2014   Procedure: EXPLORATORY LAPAROTOMY;  Surgeon: Ralene Ok, MD;  Location: [facility];  Service: General;  Laterality: N/A;  . LYSIS OF ADHESION N/A 10/25/2014   Procedure: LYSIS OF ADHESIONS ;  Surgeon: Ralene Ok, MD;  Location: [facility];  Service: General;  Laterality: N/A;  . MUCOSAL ADVANCEMENT FLAP N/A 10/25/2014   Procedure: MUCOSAL ADVANCEMENT FLAP;  Surgeon: Ralene Ok, MD;  Location: [facility];  Service: General;  Laterality: N/A;  . ORIF RADIAL FRACTURE Right 06/17/2016   Procedure: right radial head arthroplasty with ligament repair, reconstruciton;  Surgeon: Roseanne Kaufman, MD;  Location: [facility];  Service: Orthopedics;  Laterality: Right;  . RADIAL HEAD ARTHROPLASTY Right 06/17/2016  . TOOTH EXTRACTION    . TRANSTHORACIC ECHOCARDIOGRAM  05-26-2011   MILD LVH/  EF [id number]  GRADE I DIASTOLIC DYSFUNCTION/  MILD AR//   06-20-2008  NOMRAL STRESS ECHO  . UMBILICAL HERNIA  REPAIR  2013   AND REVISION THE SAME YEAR W/ MESH  . WISDOM TOOTH EXTRACTION  AGE 56     OB History    Gravida  3   Para  3   Term  3   Preterm      AB      Living  2     SAB      TAB      Ectopic      Multiple      Live Births              Family History  Problem Relation Age of Onset  . Dementia Mother   . Leukemia Child   . Cirrhosis Father     Social History  Tobacco Use  . Smoking status: Former Smoker    Packs/day: 0.25    Years: 2.00    Pack years: 0.50    Types: Cigarettes    Quit date: 02/04/1976    Years since quitting: 43.1  . Smokeless tobacco: Never Used  Substance Use Topics  . Alcohol use: Yes    Comment: occasional  . Drug use: No    Home Medications Prior to Admission medications   Medication Sig Start Date End Date Taking? Authorizing Provider  acetaminophen (TYLENOL) 500 MG tablet Take 1,000 mg by mouth every 6 (six) hours as needed (pain).    Yes [provider]  albuterol (PROVENTIL HFA;VENTOLIN HFA) 108 (90 BASE) MCG/ACT inhaler Inhale 2 puffs into the lungs every 6 (six) hours as needed for wheezing or shortness of breath.   Yes [provider]  amLODipine (NORVASC) 2.5 MG tablet Take 2.5 mg by mouth daily.   Yes [provider]  aspirin 81 MG chewable tablet Chew 81 mg by mouth daily.   Yes [provider]  buPROPion (WELLBUTRIN XL) 300 MG 24 hr tablet Take 300 mg by mouth daily.    Yes [provider]  Cholecalciferol (VITAMIN D) 2000 units CAPS Take 2,000 Units by mouth daily.   Yes [provider]  clobetasol cream (TEMOVATE) [id number] % Apply 1 application topically 2 (two) times daily as needed (rash).  04/01/16  Yes [provider]  Coenzyme Q10 (CO Q 10) 100 MG CAPS Take 100 mg by mouth daily.    Yes [provider]  [employer] VITAMIN C 1000 MG tablet Take 2,000 mg by mouth 2 (two) times daily.  06/13/16  Yes [provider]  dicyclomine (BENTYL) 10  MG capsule Take 10 mg by mouth every 4 (four) hours as needed (for abdominal cramps).  02/13/15  Yes [provider]  Evening Primrose Oil 1000 MG CAPS Take 1,000 mg by mouth daily.    Yes [provider]  Fexofenadine HCl (ALLEGRA PO) Take 1 tablet by mouth daily as needed (ALLERGIES).   Yes [provider]  Flaxseed, Linseed, 1000 MG CAPS Take 1,000 mg by mouth 2 (two) times daily.    Yes [provider]  fluticasone (FLONASE) 50 MCG/ACT nasal spray Place 2 sprays into both nostrils daily as needed for allergies or rhinitis.    Yes [provider]  hydrochlorothiazide 25 MG tablet Take 25 mg by mouth daily.    Yes [provider]  losartan (COZAAR) 100 MG tablet TAKE 1 TABLET BY MOUTH EVERY DAY Patient taking differently: Take 100 mg by mouth daily.  09/14/17  Yes Martinique, Peter M, MD  meclizine (ANTIVERT) 25 MG tablet Take 1 tablet (25 mg total) by mouth 3 (three) times daily as needed for dizziness. 04/14/13  Yes Molpus, John, MD  metoprolol succinate (TOPROL-XL) 100 MG 24 hr tablet Take 1 tablet in the morning and 1 (50 mg) in the evening. Take with or immediately following a meal. Patient taking differently: Take 100 mg by mouth 2 (two) times daily.  10/16/17  Yes Martinique, Peter M, MD  montelukast (SINGULAIR) 10 MG tablet Take 10 mg by mouth daily.    Yes [provider]  Multiple Vitamin (MULTIVITAMIN WITH MINERALS) TABS Take 1 tablet by mouth daily.   Yes [provider]  nystatin (MYCOSTATIN) 100000 UNIT/ML suspension Take 5 mLs by mouth daily as needed (FLARES, LICHEN PLANTIS).   Yes [provider]  nystatin cream (MYCOSTATIN) Apply  1 application topically 2 (two) times daily as needed for dry skin.   Yes [provider]  pantoprazole (PROTONIX) 20 MG tablet Take 20 mg by mouth daily. 01/07/19  Yes [provider]  phenazopyridine (PYRIDIUM) 200 MG tablet Take 200 mg by mouth 3 (three) times daily  as needed for pain [place] TRACT PAIN).   Yes [provider]  potassium chloride SA (KLOR-CON M20) 20 MEQ tablet Take 1.5 tablets (30 mEq total) by mouth daily. 03/09/19 04/08/19 Yes Martinique, Peter M, MD  Probiotic Product (ALIGN PO) Take 1 capsule by mouth 2 (two) times daily.    Yes [provider]  valACYclovir (VALTREX) 500 MG tablet INCREASE TO TAKING 1 TABLET 2 TIMES A DAY FOR 3 DAYS WITH OUTBREAK Patient taking differently: Take 500 mg by mouth daily.  03/25/16  Yes Kem Boroughs, FNP  ALPRAZolam Duanne Moron) 0.25 MG tablet Take I pill every 6 hours as needed for anxiety Patient taking differently: Take 0.25 mg by mouth every 6 (six) hours as needed for anxiety. Take I pill every 6 hours as needed for anxiety 06/18/16   Avelina Laine, PA-C  amLODipine (NORVASC) 2.5 MG tablet Take 1 tablet (2.5 mg total) by mouth daily. 08/21/17 11/19/17  Almyra Deforest, PA  azelastine (OPTIVAR) 0.05 % ophthalmic solution Apply 1 drop to eye daily.  07/28/17   [provider]  cycloSPORINE (RESTASIS) 0.05 % ophthalmic emulsion Place 1 drop into both eyes 2 (two) times daily.    [provider]  HYDROCORTISONE ACE, RECTAL, 30 MG SUPP Place 30 mg rectally 2 (two) times daily as needed for hemorrhoids. 04/07/16   [provider]  ibuprofen (ADVIL,MOTRIN) 200 MG tablet Take 400-600 mg by mouth 2 (two) times daily as needed (pain).     [provider]  lidocaine (XYLOCAINE) 5 % ointment Apply 1 application topically 3 (three) times daily as needed for mild pain or moderate pain.    [provider]  metoprolol succinate (TOPROL-XL) 100 MG 24 hr tablet Take 1 tablet (100 mg total) by mouth 2 (two) times daily. Take with or immediately following a meal. Take 100 mg by mouth 2 (two) times daily. Take with or immediately following a meal. 07/14/18   Martinique, Peter M, MD  neomycin-polymyxin-dexamethasone (MAXITROL) 0.1 % ophthalmic suspension Place 1 drop into both eyes 4  (four) times daily. 07/01/17   [provider]  omeprazole (PRILOSEC) 20 MG capsule Take 20 mg by mouth daily.    [provider]  omeprazole (PRILOSEC) 20 MG capsule Take 1 capsule (20 mg total) by mouth daily. 11/18/18   Horton, Barbette Hair, MD  ondansetron (ZOFRAN ODT) 4 MG disintegrating tablet Take 1 tablet (4 mg total) by mouth every 8 (eight) hours as needed for nausea or vomiting. 11/18/18   Horton, Barbette Hair, MD  ondansetron (ZOFRAN) 4 MG tablet Take 1 tablet (4 mg total) by mouth every 8 (eight) hours as needed for up to 10 doses for nausea or vomiting. 05/31/17   Arrien, Jimmy Picket, MD  PAZEO 0.7 % SOLN Place 1 drop into both eyes daily as needed for allergies. 06/05/16   [provider]  PREVIDENT 5000 DRY MOUTH 1.1 % GEL dental gel Place 1 application onto teeth at bedtime.  09/01/14   [provider]  simvastatin (ZOCOR) 40 MG tablet Take 40 mg by mouth daily.     [provider]  zolpidem (AMBIEN) 10 MG tablet Take 10 mg by mouth at bedtime as  needed for sleep. 07/15/17   [provider]    Allergies    Hydrocodone, Hydrocodone-acetaminophen, and Tramadol  Review of Systems   Review of Systems  Constitutional: Negative for chills, diaphoresis and fever.  Eyes: Positive for visual disturbance. Negative for photophobia, pain, redness and itching.  Respiratory: Negative for shortness of breath.   Cardiovascular: Negative for chest pain.  Gastrointestinal: Negative for abdominal pain, diarrhea, nausea and vomiting.  Musculoskeletal: Negative for neck pain and neck stiffness.  Neurological: Negative for dizziness, syncope, facial asymmetry, speech difficulty, weakness, light-headedness, numbness and headaches.  All other systems reviewed and are negative.   Physical Exam Updated Vital Signs BP (!) 144/75 (BP Location: Right Arm)   Pulse 68   Temp 98.4 F (36.9 C) (Oral)   Resp 18   Ht 5\' 4"  (1.626 m)   Wt 73.9 kg   SpO2  100%   BMI 27.98 kg/m   Physical Exam Vitals and nursing note reviewed.  Constitutional:      General: She is not in acute distress.    Appearance: She is well-developed. She is not diaphoretic.  HENT:     Head: Normocephalic and atraumatic.     Mouth/Throat:     Mouth: Mucous membranes are moist.     Pharynx: Oropharynx is clear.  Eyes:     Conjunctiva/sclera: Conjunctivae normal.     Comments: No contact lenses in place.  EOMs are intact and in sync.  No pain with EOMs.  No noted eye or lid ptosis.  Pupils are PERRL. Patient does have some haziness in the area of the lens bilaterally, consistent with cataracts, of which she has a history.     Visual Acuity  Right Eye Distance: 20/25 Left Eye Distance: 20/40 She states this is typical for her. Bilateral Distance: 20/25  Right Eye Near:   Left Eye Near:    Bilateral Near:       Cardiovascular:     Rate and Rhythm: Normal rate and regular rhythm.     Pulses: Normal pulses.          Radial pulses are 2+ on the right side and 2+ on the left side.     Heart sounds: Normal heart sounds.     Comments: Tactile temperature in the extremities appropriate and equal bilaterally. Pulmonary:     Effort: Pulmonary effort is normal. No respiratory distress.     Breath sounds: Normal breath sounds.  Abdominal:     Palpations: Abdomen is soft.     Tenderness: There is no abdominal tenderness. There is no guarding.  Musculoskeletal:     Cervical back: Neck supple.     Right lower leg: No edema.     Left lower leg: No edema.  Lymphadenopathy:     Cervical: No cervical adenopathy.  Skin:    General: Skin is warm and dry.  Neurological:     Mental Status: She is alert.     Comments: No noted acute cognitive deficit. Sensation grossly intact to light touch in the extremities.   Grip strengths equal bilaterally.   Strength 5/5 in all extremities.  No gait disturbance.  Coordination intact.  Cranial nerves III-XII grossly intact.   Handles oral secretions without noted difficulty.  No noted phonation or speech deficit. No facial droop.   Psychiatric:        Mood and Affect: Mood and affect normal.        Speech: Speech normal.  Behavior: Behavior normal.     ED Results / Procedures / Treatments   Labs (all labs ordered are listed, but only abnormal results are displayed) Labs Reviewed - No data to display  EKG None  Radiology No results found.  Procedures Procedures (including critical care time)  Medications Ordered in ED Medications - No data to display  ED Course  I have reviewed the triage vital signs and the nursing notes.  Pertinent labs & imaging results that were available during my care of the patient were reviewed by me and considered in my medical decision making (see chart for details).    MDM Rules/Calculators/A&P                      Patient presents following an incident several hours ago she describes as her eyes "going out of focus."  The incident resolved and has not recurred.  She had no additional symptoms. No focal neurologic deficits. We will have her follow-up with her optometrist and PCP on this matter. Return precautions discussed.  Patient voices understanding of these instructions, accepts the plan, and is comfortable with discharge.   Findings and plan of care discussed with Cynthia Kroner, MD.        Final Clinical Impression(s) / ED Diagnoses Final diagnoses:  Blurry vision, bilateral    Rx / DC Orders ED Discharge Orders    None       Layla Maw 04/05/19 1213    Margette Fast, MD 04/05/19 1303

## 2019-04-04 ENCOUNTER — Other Ambulatory Visit: Payer: Self-pay | Admitting: Cardiology

## 2019-04-04 DIAGNOSIS — I493 Ventricular premature depolarization: Secondary | ICD-10-CM

## 2019-04-30 ENCOUNTER — Other Ambulatory Visit: Payer: Self-pay | Admitting: Cardiology

## 2019-04-30 DIAGNOSIS — I493 Ventricular premature depolarization: Secondary | ICD-10-CM

## 2019-06-01 ENCOUNTER — Other Ambulatory Visit: Payer: Self-pay | Admitting: Cardiology

## 2019-06-01 DIAGNOSIS — I493 Ventricular premature depolarization: Secondary | ICD-10-CM

## 2019-06-03 IMAGING — DX DG ABD PORTABLE 1V
1 series · 1 of 1 positions shown · non-contrast
Comparison: CT Abdomen and Pelvis 4833 hours today.

CLINICAL DATA: 66-year-old female NG tube placement.

EXAM:
PORTABLE ABDOMEN - 1 VIEW

[abdomen kub]
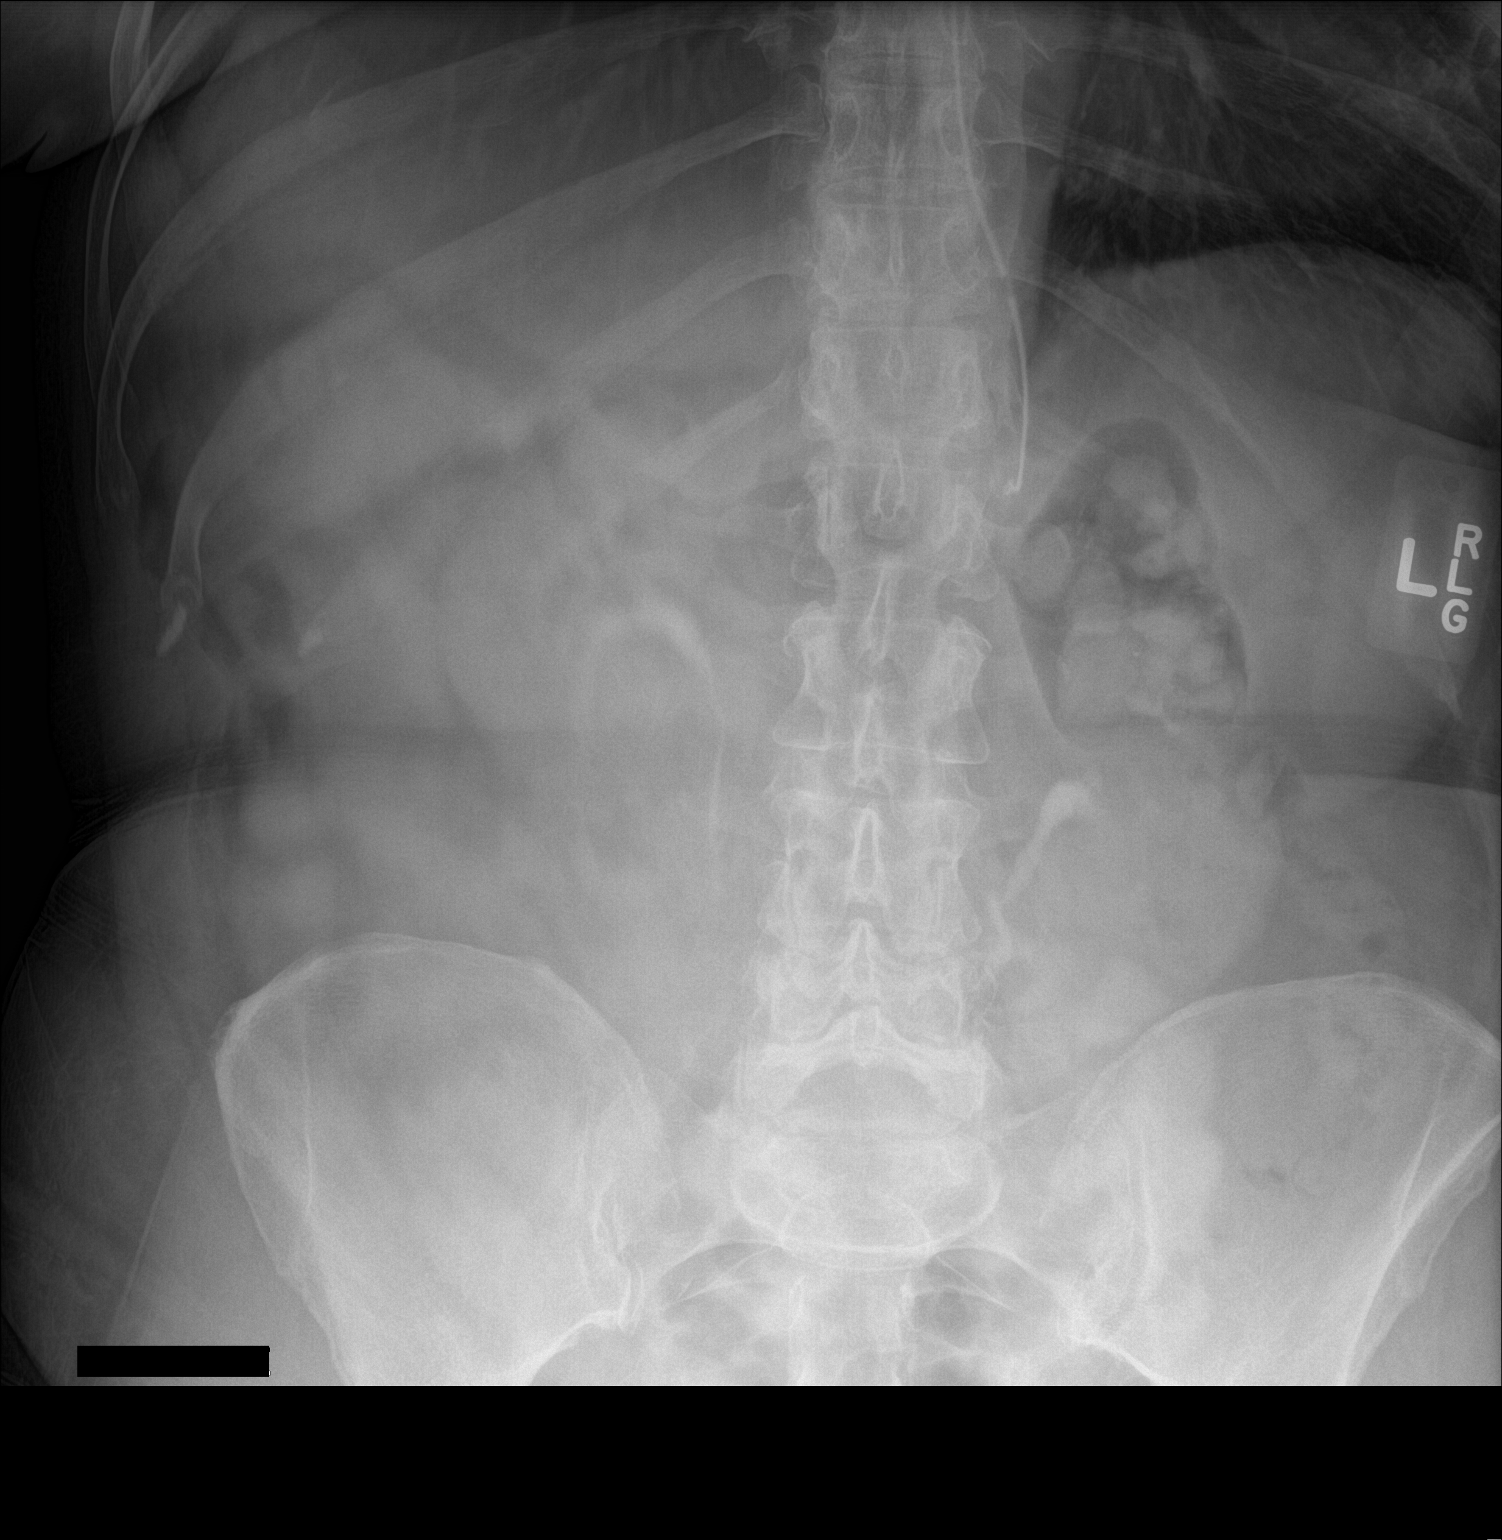

[1 of 1 positions shown; findings below may reference images not displayed]

FINDINGS: Portable AP upright view at 5161 hours. An enteric tube courses into
the proximal stomach from the lower mediastinum. The side hole is at
the level of the distal thoracic esophagus (arrow). Negative left
lung base. Elevated right hemidiaphragm again noted. IV contrast is
being excreted from both kidneys, proximal ureters appear within
normal limits. Stable bowel gas pattern. No osseous abnormality
identified.
IMPRESSION: Enteric tube coursing into the proximal stomach, side hole still the
distal esophagus. Advance the tube 6 centimeters to ensure side hole
placement within the stomach.

## 2019-06-03 IMAGING — CT CT ABD-PELV W/ CM
2 of 5 series · 15 of 46 positions shown, 17 images · IV contrast (APPLIED)
Comparison: None.

CLINICAL DATA: Abdominal pain

EXAM:
CT ABDOMEN AND PELVIS WITH CONTRAST
TECHNIQUE: Multidetector CT imaging of the abdomen and pelvis was performed
using the standard protocol following bolus administration of
intravenous contrast.
CONTRAST:  100mL 3RALF9-NKK IOPAMIDOL (3RALF9-NKK) INJECTION 61%

[Series 2: axial st · axial · 0.80mm/px · z∈[-444,-19]mm · 12 of 97 slices shown, 14 images]
[im 6/97  soft-tissue]
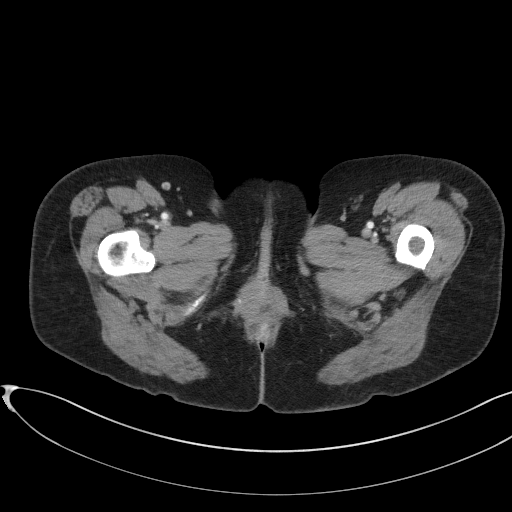
[im 6/97  bone]
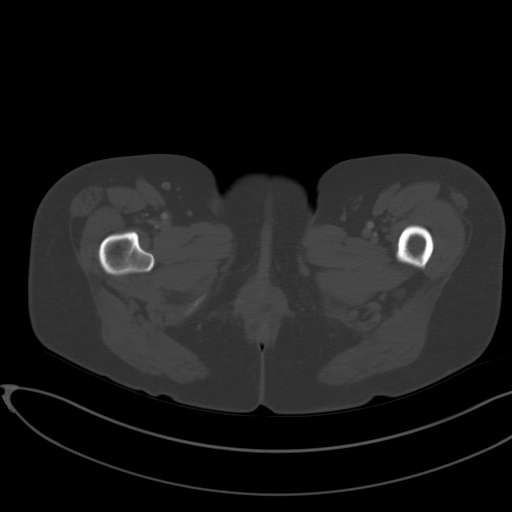
[im 17/97  soft-tissue]
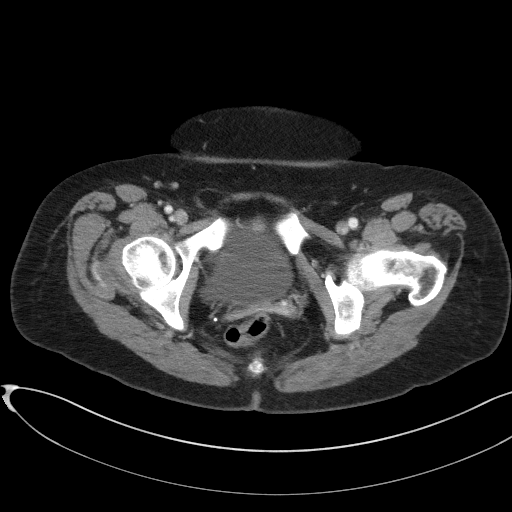
[im 22/97  soft-tissue]
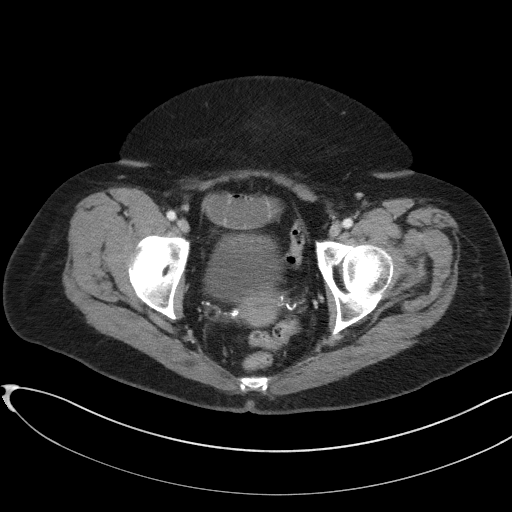
[im 27/97  soft-tissue]
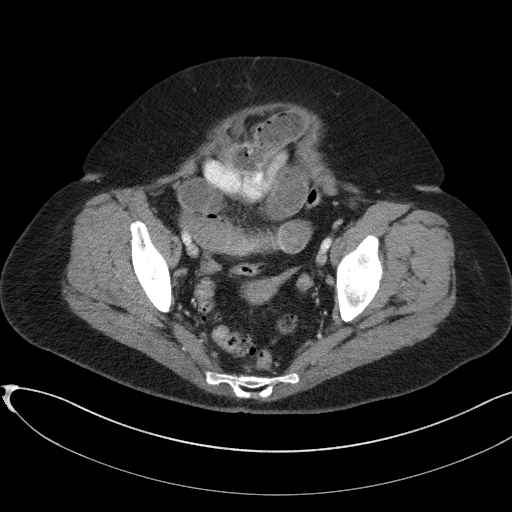
[im 38/97  soft-tissue]
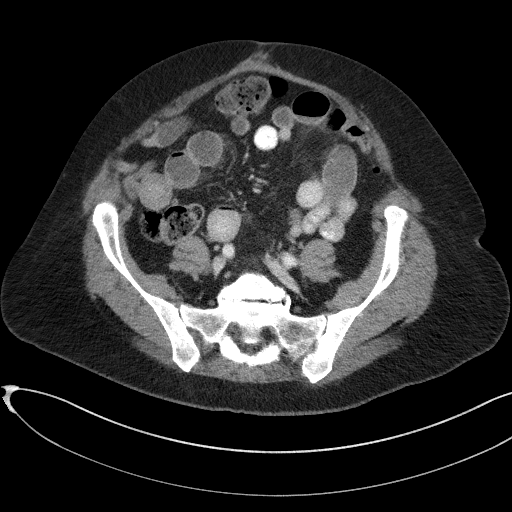
[im 43/97  soft-tissue]
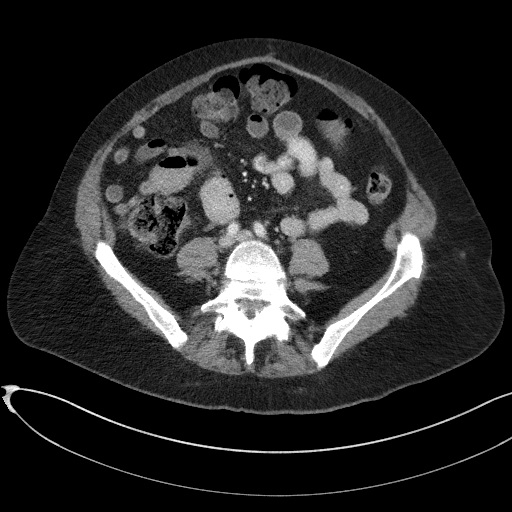
[im 54/97  soft-tissue]
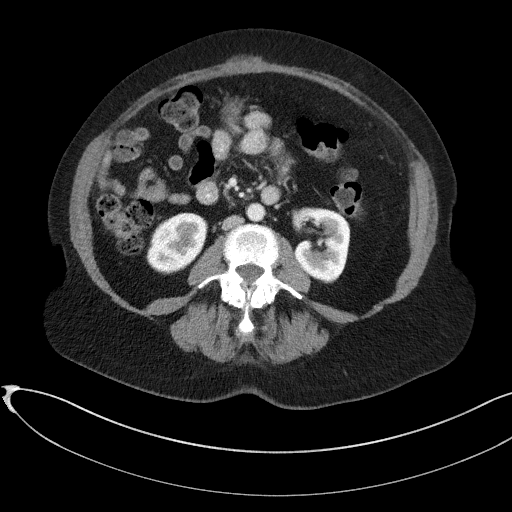
[im 59/97  soft-tissue]
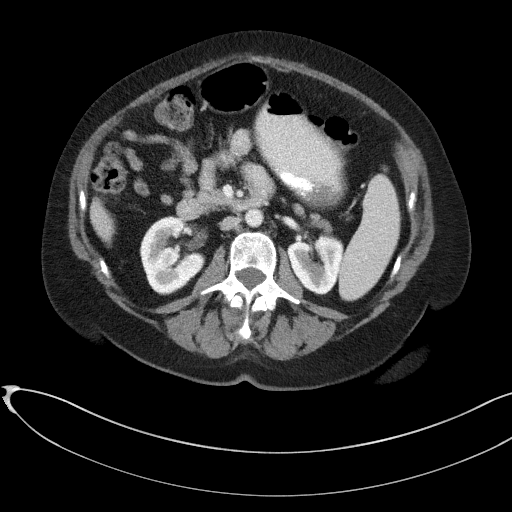
[im 70/97  soft-tissue]
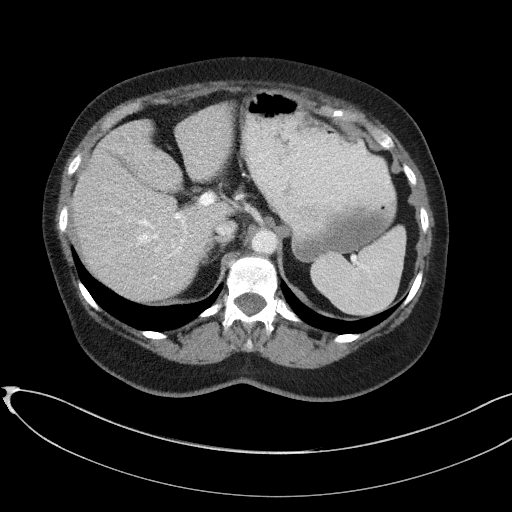
[im 70/97  bone]
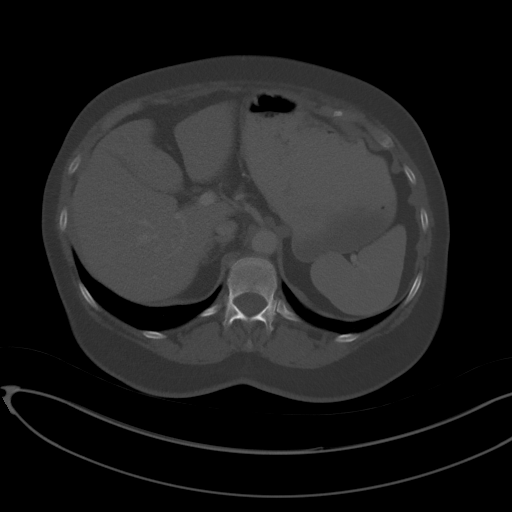
[im 75/97  soft-tissue]
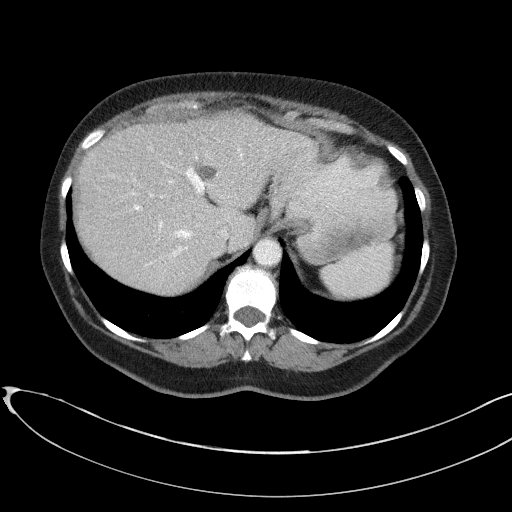
[im 81/97  soft-tissue]
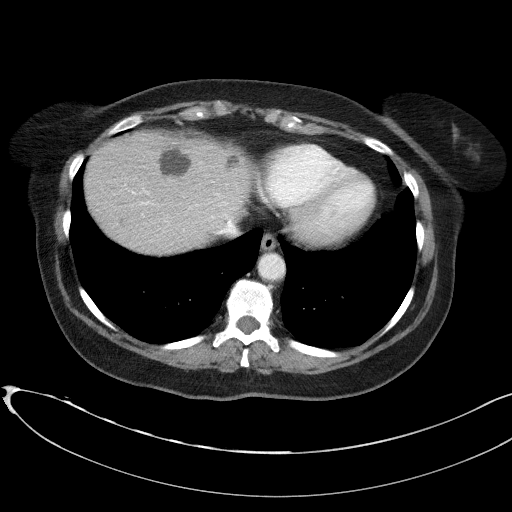
[im 91/97  soft-tissue]
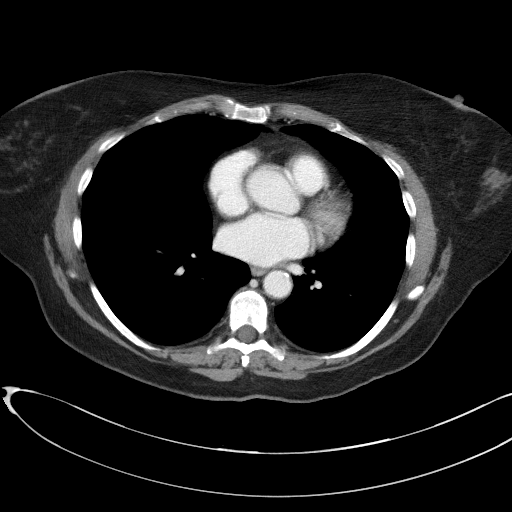

[Series 5: coronal st · coronal · 0.73mm/px · 3 of 95 slices shown]
[im 32/95  soft-tissue]
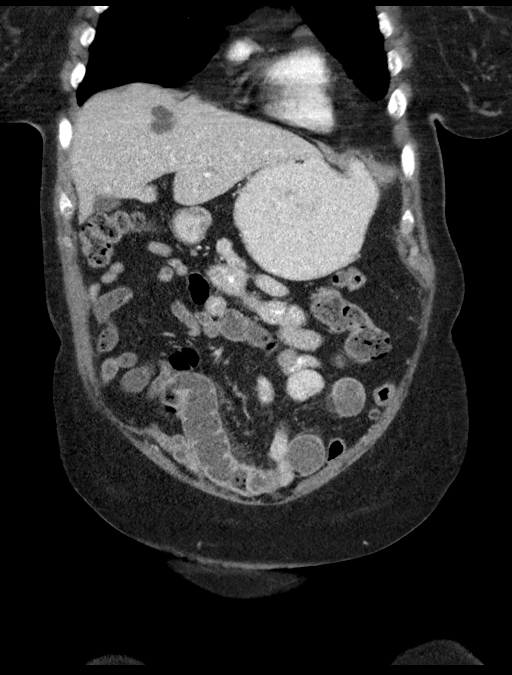
[im 42/95  soft-tissue]
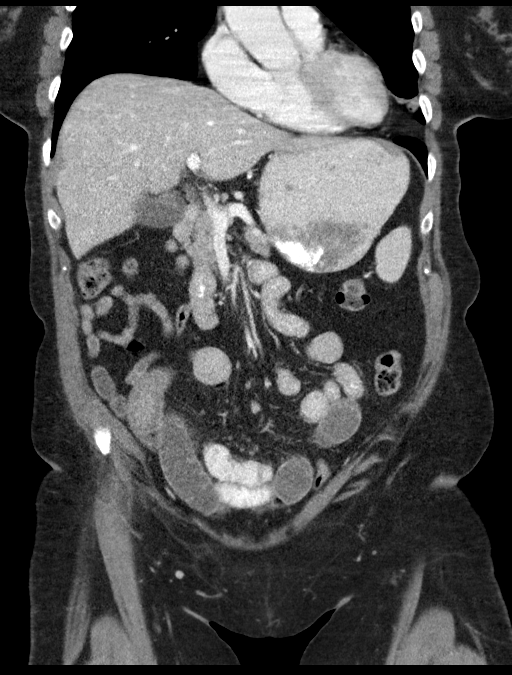
[im 53/95  soft-tissue]
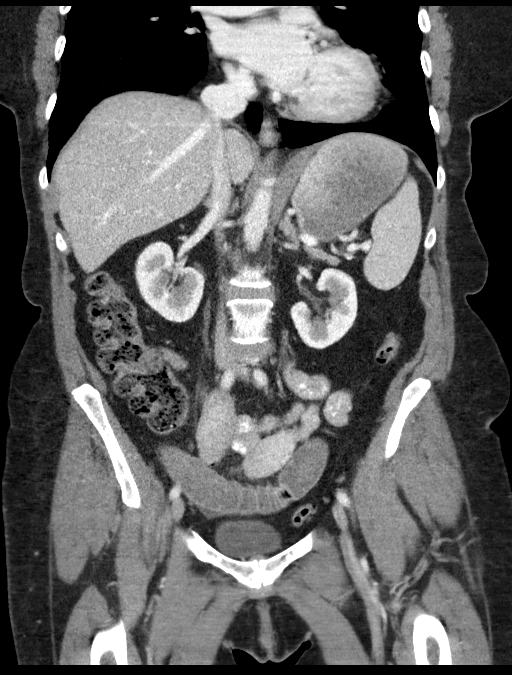

[15 of 46 positions shown; findings below may reference images not displayed]

FINDINGS: LOWER CHEST: No basilar pulmonary nodules or pleural effusion. No
apical pericardial effusion.

HEPATOBILIARY: Unchanged 2.5 cm hepatic cyst. Smaller hypodense foci
are also unchanged. Normal gallbladder.

PANCREAS: Normal parenchymal contours without ductal dilatation. No
peripancreatic fluid collection.

SPLEEN: Normal.

ADRENALS/URINARY TRACT:

--Adrenal glands: Normal.

--Right kidney/ureter: No hydronephrosis, nephroureterolithiasis,
perinephric stranding or solid renal mass.

--Left kidney/ureter: No hydronephrosis, nephroureterolithiasis,
perinephric stranding or solid renal mass.

--Urinary bladder: Normal for degree of distention

STOMACH/BOWEL:

--Stomach/Duodenum: No hiatal hernia or other gastric abnormality.
Normal duodenal course.

--Small bowel: Multiple loops of prominent, but not pathologically
dilated, small bowel in the central low mid abdomen with
decompression of the distal ileum. Caliber transition occurs in the
right lower quadrant. There is mild stranding surrounding a loop in
the right lower quadrant (coronal image 39, axial image 62).

--Colon: No focal abnormality.

--Appendix: Normal.

VASCULAR/LYMPHATIC: Atherosclerotic calcification is present within
the non-aneurysmal abdominal aorta, without hemodynamically
significant stenosis. The portal vein, splenic vein, superior
mesenteric vein and IVC are patent. No abdominal or pelvic
lymphadenopathy.

REPRODUCTIVE: Normal uterus and ovaries.

MUSCULOSKELETAL. Grade 1 anterolisthesis at L4-L5 secondary to facet
hypertrophy.

OTHER: None.
IMPRESSION: 1. Prominent, borderline dilated small bowel in the central, low mid
abdomen with decompressed distal ileum. Mild inflammatory stranding
surrounding a loop of small bowel in the right lower quadrant. This
may indicate partial obstruction or adynamic ileus secondary to
active inflammation. The appearance is very similar to the
05/29/2017 study, but the location of the inflammatory changes
different.
2.  Aortic Atherosclerosis ([id number]-[id number].V).
3. Grade 1 L4-5 anterolisthesis secondary to facet arthrosis.

## 2019-06-09 DIAGNOSIS — G5620 Lesion of ulnar nerve, unspecified upper limb: Secondary | ICD-10-CM | POA: Insufficient documentation

## 2019-06-14 ENCOUNTER — Ambulatory Visit (INDEPENDENT_AMBULATORY_CARE_PROVIDER_SITE_OTHER): Payer: Medicare PPO

## 2019-06-14 ENCOUNTER — Other Ambulatory Visit: Payer: Self-pay

## 2019-06-14 ENCOUNTER — Ambulatory Visit (INDEPENDENT_AMBULATORY_CARE_PROVIDER_SITE_OTHER): Payer: Medicare PPO | Admitting: Podiatry

## 2019-06-14 ENCOUNTER — Encounter: Payer: Self-pay | Admitting: Podiatry

## 2019-06-14 ENCOUNTER — Telehealth: Payer: Self-pay | Admitting: *Deleted

## 2019-06-14 DIAGNOSIS — M722 Plantar fascial fibromatosis: Secondary | ICD-10-CM

## 2019-06-14 DIAGNOSIS — M778 Other enthesopathies, not elsewhere classified: Secondary | ICD-10-CM

## 2019-06-14 NOTE — Progress Notes (Signed)
Subjective:  Patient ID: Cynthia Peters, female    DOB: 06/26/1950,  MRN: [id number] HPI Chief Complaint  Patient presents with  . Foot Pain    Lateral side and plantar heel left - aching x few months, previous fracture and plantar fasciitis, worse with a lot of walking, tried Tyleno/Advil, worse boot for awhile (helped)   . New Patient (Initial Visit)    Est pt 2017    69 y.o. female presents with the above complaint.   ROS: Denies fever chills nausea vomiting muscle aches pains calf pain back pain chest pain shortness of breath.  Past Medical History:  Diagnosis Date  . Anemia    history in the past, not on a regular basis  . Arthritis   . Bronchitis   . Cataracts, bilateral    immature  . Claustrophobia    takes Valium daily as needed  . Complication of anesthesia    2013 SURGERY vocal cord bothering pt, used smaller ent tube after 2 hernia repair, no problem after . " I woke up during my MRI and colonoscopy."       . Depression    takes Wellbutrin daily  . Dry eye   . Dry mouth   . GERD (gastroesophageal reflux disease)    takes Omeprazole daily   . H/O hiatal hernia   . Headache    migraines with flashing lights   . History of blood clots 40 yrs    leg   . History of echocardiogram 2013   a. Echo (05/2011): Mild LVH, EF 65-70%, no WMA, Gr 1 DD, mild AI.  Marland Kitchen History of MRSA infection   . History of staph infection   . History of stress test 2010   a. ETT-Echo in 2010 was normal  . History of TIA (transient ischemic attack) 2013   a. Carotid US (05/2011): No ICA stenosis, pt unsure of this  . Hyperlipidemia    takes Simvastatin daily   . Hypertension    takes  Losartan daily  . IBS (irritable bowel syndrome)    takes Bentyl daily as needed  . Insomnia    takes Ambien nightly  . Interstitial cystitis   . Lichen planus   . Mild asthma    Albuterol inhaler as needed  . Palpitations    Event monitor in 2007 with rare PVCs // takes Metoprolol daily  .  PVC (premature ventricular contraction)   . Seasonal allergies    takes Claritin daily as needed.Takes Singulair daily as needed.  Marland Kitchen TIA (transient ischemic attack) 05/27/2011  . Urethral stricture   . Vertigo    takes Meclizine daily as needed   Past Surgical History:  Procedure Laterality Date  . CESAREAN SECTION    . COLONOSCOPY WITH PROPOFOL N/A 08/10/2012   Procedure: COLONOSCOPY WITH PROPOFOL;  Surgeon: Garlan Fair, MD;  Location: [facility];  Service: Endoscopy;  Laterality: N/A;  . CYSTOSCOPY WITH URETHRAL DILATATION N/A 02/18/2013   Procedure: CYSTOSCOPY WITH URETHRAL DILATATION ( NO BALLOON) AND HYDRODISTENSION;  Surgeon: Alexis Frock, MD;  Location: [facility];  Service: Urology;  Laterality: N/A;  . ENDOMETRIAL ABLATION W/ HYDROTHERMABLATOR  09-27-2002  . INCISIONAL HERNIA REPAIR N/A 10/25/2014   Procedure: HERNIA REPAIR INCISIONAL;  Surgeon: Ralene Ok, MD;  Location: [place];  Service: General;  Laterality: N/A;  . INCISIONAL HERNIA REPAIR N/A 03/26/2015   Procedure: LAPAROSCOPIC INCISIONAL HERNIA;  Surgeon: Ralene Ok, MD;  Location: [place];  Service: General;  Laterality: N/A;  .  INSERTION OF MESH N/A 10/25/2014   Procedure: INSERTION OF MESH;  Surgeon: Ralene Ok, MD;  Location: [place];  Service: General;  Laterality: N/A;  . LAPAROSCOPIC LYSIS OF ADHESIONS N/A 03/26/2015   Procedure: LAPAROSCOPIC LYSIS OF ADHESIONS;  Surgeon: Ralene Ok, MD;  Location: [place];  Service: General;  Laterality: N/A;  . LAPAROTOMY N/A 10/25/2014   Procedure: EXPLORATORY LAPAROTOMY;  Surgeon: Ralene Ok, MD;  Location: [place];  Service: General;  Laterality: N/A;  . LYSIS OF ADHESION N/A 10/25/2014   Procedure: LYSIS OF ADHESIONS ;  Surgeon: Ralene Ok, MD;  Location: [place];  Service: General;  Laterality: N/A;  . MUCOSAL ADVANCEMENT FLAP N/A 10/25/2014   Procedure: MUCOSAL ADVANCEMENT FLAP;  Surgeon: Ralene Ok, MD;  Location: [place];  Service:  General;  Laterality: N/A;  . ORIF RADIAL FRACTURE Right 06/17/2016   Procedure: right radial head arthroplasty with ligament repair, reconstruciton;  Surgeon: Roseanne Kaufman, MD;  Location: [place];  Service: Orthopedics;  Laterality: Right;  . RADIAL HEAD ARTHROPLASTY Right 06/17/2016  . TOOTH EXTRACTION    . TRANSTHORACIC ECHOCARDIOGRAM  05-26-2011   MILD LVH/  EF 14-78%/  GRADE I DIASTOLIC DYSFUNCTION/  MILD AR//   06-20-2008  NOMRAL STRESS ECHO  . UMBILICAL HERNIA REPAIR  2013   AND REVISION THE SAME YEAR W/ MESH  . WISDOM TOOTH EXTRACTION  AGE 25    Current Outpatient Medications:  .  acetaminophen (TYLENOL) 500 MG tablet, Take 1,000 mg by mouth every 6 (six) hours as needed (pain). , Disp: , Rfl:  .  albuterol (PROVENTIL HFA;VENTOLIN HFA) 108 (90 BASE) MCG/ACT inhaler, Inhale 2 puffs into the lungs every 6 (six) hours as needed for wheezing or shortness of breath., Disp: , Rfl:  .  ALPRAZolam (XANAX) 0.25 MG tablet, Take I pill every 6 hours as needed for anxiety (Patient taking differently: Take 0.25 mg by mouth every 6 (six) hours as needed for anxiety. Take I pill every 6 hours as needed for anxiety), Disp: 30 tablet, Rfl: 0 .  amLODipine (NORVASC) 2.5 MG tablet, Take 1 tablet (2.5 mg total) by mouth daily., Disp: 90 tablet, Rfl: 3 .  amLODipine (NORVASC) 2.5 MG tablet, Take 2.5 mg by mouth daily., Disp: , Rfl:  .  aspirin 81 MG chewable tablet, Chew 81 mg by mouth daily., Disp: , Rfl:  .  azelastine (OPTIVAR) 0.05 % ophthalmic solution, Apply 1 drop to eye daily. , Disp: , Rfl: 0 .  buPROPion (WELLBUTRIN XL) 300 MG 24 hr tablet, Take 300 mg by mouth daily. , Disp: , Rfl:  .  Cholecalciferol (VITAMIN D) 2000 units CAPS, Take 2,000 Units by mouth daily., Disp: , Rfl:  .  clobetasol cream (TEMOVATE) 2.95 %, Apply 1 application topically 2 (two) times daily as needed (rash). , Disp: , Rfl: 7 .  Coenzyme Q10 (CO Q 10) 100 MG CAPS, Take 100 mg by mouth daily. , Disp: , Rfl:  .  [employer] VITAMIN  C 1000 MG tablet, Take 2,000 mg by mouth 2 (two) times daily. , Disp: , Rfl: 1 .  cycloSPORINE (RESTASIS) 0.05 % ophthalmic emulsion, Place 1 drop into both eyes 2 (two) times daily., Disp: , Rfl:  .  dicyclomine (BENTYL) 10 MG capsule, Take 10 mg by mouth every 4 (four) hours as needed (for abdominal cramps). , Disp: , Rfl: 1 .  Evening Primrose Oil 1000 MG CAPS, Take 1,000 mg by mouth daily. , Disp: , Rfl:  .  Fexofenadine HCl (  ALLEGRA PO), Take 1 tablet by mouth daily as needed (ALLERGIES)., Disp: , Rfl:  .  Flaxseed, Linseed, 1000 MG CAPS, Take 1,000 mg by mouth 2 (two) times daily. , Disp: , Rfl:  .  fluticasone (FLONASE) 50 MCG/ACT nasal spray, Place 2 sprays into both nostrils daily as needed for allergies or rhinitis. , Disp: , Rfl:  .  hydrochlorothiazide 25 MG tablet, Take 25 mg by mouth daily. , Disp: , Rfl:  .  HYDROCORTISONE ACE, RECTAL, 30 MG SUPP, Place 30 mg rectally 2 (two) times daily as needed for hemorrhoids., Disp: , Rfl: 5 .  ibuprofen (ADVIL,MOTRIN) 200 MG tablet, Take 400-600 mg by mouth 2 (two) times daily as needed (pain). , Disp: , Rfl:  .  KLOR-CON M20 20 MEQ tablet, TAKE 1&1/2 TABLETS BY MOUTH DAILY. PLEASE SCHEDULE ANNUAL APPT WITH DR. Martinique FOR REFILLS, Disp: 45 tablet, Rfl: 0 .  lidocaine (XYLOCAINE) 5 % ointment, Apply 1 application topically 3 (three) times daily as needed for mild pain or moderate pain., Disp: , Rfl:  .  losartan (COZAAR) 100 MG tablet, TAKE 1 TABLET BY MOUTH EVERY DAY (Patient taking differently: Take 100 mg by mouth daily. ), Disp: 90 tablet, Rfl: 3 .  meclizine (ANTIVERT) 25 MG tablet, Take 1 tablet (25 mg total) by mouth 3 (three) times daily as needed for dizziness., Disp: 30 tablet, Rfl: 0 .  metoprolol succinate (TOPROL-XL) 100 MG 24 hr tablet, Take 1 tablet in the morning and 1 (50 mg) in the evening. Take with or immediately following a meal. (Patient taking differently: Take 100 mg by mouth 2 (two) times daily. ), Disp: 180 tablet, Rfl:  1 .  metoprolol succinate (TOPROL-XL) 100 MG 24 hr tablet, Take 1 tablet (100 mg total) by mouth 2 (two) times daily. Take with or immediately following a meal. Take 100 mg by mouth 2 (two) times daily. Take with or immediately following a meal., Disp: 30 tablet, Rfl: 1 .  montelukast (SINGULAIR) 10 MG tablet, Take 10 mg by mouth daily. , Disp: , Rfl:  .  Multiple Vitamin (MULTIVITAMIN WITH MINERALS) TABS, Take 1 tablet by mouth daily., Disp: , Rfl:  .  neomycin-polymyxin-dexamethasone (MAXITROL) 0.1 % ophthalmic suspension, Place 1 drop into both eyes 4 (four) times daily., Disp: , Rfl:  .  nystatin (MYCOSTATIN) 100000 UNIT/ML suspension, Take 5 mLs by mouth daily as needed (FLARES, LICHEN PLANTIS)., Disp: , Rfl:  .  nystatin cream (MYCOSTATIN), Apply 1 application topically 2 (two) times daily as needed for dry skin., Disp: , Rfl:  .  omeprazole (PRILOSEC) 20 MG capsule, Take 20 mg by mouth daily., Disp: , Rfl:  .  omeprazole (PRILOSEC) 20 MG capsule, Take 1 capsule (20 mg total) by mouth daily., Disp: 30 capsule, Rfl: 0 .  ondansetron (ZOFRAN ODT) 4 MG disintegrating tablet, Take 1 tablet (4 mg total) by mouth every 8 (eight) hours as needed for nausea or vomiting., Disp: 20 tablet, Rfl: 0 .  ondansetron (ZOFRAN) 4 MG tablet, Take 1 tablet (4 mg total) by mouth every 8 (eight) hours as needed for up to 10 doses for nausea or vomiting., Disp: 10 tablet, Rfl: 0 .  pantoprazole (PROTONIX) 20 MG tablet, Take 20 mg by mouth daily., Disp: , Rfl:  .  PAZEO 0.7 % SOLN, Place 1 drop into both eyes daily as needed for allergies., Disp: , Rfl: 4 .  phenazopyridine (PYRIDIUM) 200 MG tablet, Take 200 mg by mouth 3 (three) times daily as needed  for pain [place] TRACT PAIN)., Disp: , Rfl:  .  PREVIDENT 5000 DRY MOUTH 1.1 % GEL dental gel, Place 1 application onto teeth at bedtime. , Disp: , Rfl: 2 .  Probiotic Product (ALIGN PO), Take 1 capsule by mouth 2 (two) times daily. , Disp: , Rfl:  .  simvastatin  (ZOCOR) 40 MG tablet, Take 40 mg by mouth daily. , Disp: , Rfl:  .  valACYclovir (VALTREX) 500 MG tablet, INCREASE TO TAKING 1 TABLET 2 TIMES A DAY FOR 3 DAYS WITH OUTBREAK (Patient taking differently: Take 500 mg by mouth daily. ), Disp: 30 tablet, Rfl: 0 .  zolpidem (AMBIEN) 10 MG tablet, Take 10 mg by mouth at bedtime as needed for sleep., Disp: , Rfl:   Allergies  Allergen Reactions  . Hydrocodone Rash  . Hydrocodone-Acetaminophen Rash  . Tramadol Palpitations   Review of Systems Objective:  There were no vitals filed for this visit.  General: Well developed, nourished, in no acute distress, alert and oriented x3   Dermatological: Skin is warm, dry and supple bilateral. Nails x 10 are well maintained; remaining integument appears unremarkable at this time. There are no open sores, no preulcerative lesions, no rash or signs of infection present.  Vascular: Dorsalis Pedis artery and Posterior Tibial artery pedal pulses are 2/4 bilateral with immedate capillary fill time. Pedal hair growth present. No varicosities and no lower extremity edema present bilateral.   Neruologic: Grossly intact via light touch bilateral. Vibratory intact via tuning fork bilateral. Protective threshold with Semmes Wienstein monofilament intact to all pedal sites bilateral. Patellar and Achilles deep tendon reflexes 2+ bilateral. No Babinski or clonus noted bilateral.   Musculoskeletal: No gross boney pedal deformities bilateral. No pain, crepitus, or limitation noted with foot and ankle range of motion bilateral. Muscular strength 5/5 in all groups tested bilateral.  She has moderate to severe pain on palpation medial calcaneal tubercle of the left foot.  Left foot does demonstrate metatarsus adductus.  She also demonstrates pain on palpation of the fourth fifth tarsometatarsal joint left.  Gait: Unassisted, Nonantalgic.    Radiographs:  Radiographs taken today demonstrate an osseously mature individual with  mild osteopenia at the tarsus adductus healed fracture to the fifth met base or proximal diaphysis.  Soft tissue increase in density plantar fifth calcaneal insertion site.  Joint space narrowing at the fourth fifth TMT joint.  No fractures are identified.  Assessment & Plan:   Assessment: Plantar fasciitis with capsulitis of the fourth fifth TMT J  Plan: Discussed etiology pathology conservative versus surgical therapies.  At this point I injected along the fourth fifth TMT capsule.  10 mg of Kenalog 5 mg of Marcaine.  Also injected 10 mg of Kenalog to the point of maximal tenderness of the heel left.  She tolerated procedure well without complications.  Follow-up with her as needed.     Mung Rinker T. [place], [place]

## 2019-06-14 NOTE — Telephone Encounter (Signed)
Left message informing pt the injections contained a numbing agent and steroid, so it would not be unusual for the foot or skin to be normal 4-24 hours sometimes.

## 2019-06-14 NOTE — Telephone Encounter (Signed)
Pt states she was seen in office today, received 2 injections and the injection in her left foot has made the left foot numb, is that normal.

## 2019-06-16 NOTE — Progress Notes (Deleted)
{Choose 1 Note Type (Video or Telephone):[phone]}   The patient was identified using 2 identifiers.  Date:  06/16/2019   ID:  Cynthia Peters, DOB Jun 01, 1950, MRN [id number]  Patient Location: Home Provider Location: Office  PCP:  Aletha Halim., PA-C  Cardiologist:  Yarethzy Croak Martinique MD Electrophysiologist:  None   Evaluation Performed:  Follow-Up Visit  Chief Complaint:  palpitations  History of Present Illness:    Cynthia Peters is a 69 y.o. female with past medical history of hypertension, hyperlipidemia, palpitation, GERD and thoracic aortic aneurysm.  Patient had a event monitor placed in 2007 that demonstrated rare PVCs.  Echocardiogram in 2010 was normal as well.  She has been followed by Dr. Martinique for palpitations since July 2012.  She was admitted in April 2013 with TIA.  Echocardiogram obtained at the time showed mild LVH, grade 1 DD, EF 65 to 70%, mild AI.  Carotid ultrasound in April 2013 showed no significant ICA stenosis.  It does not appears patient has been seen by Dr. Martinique since 2012.  She was last seen by Ellen Henri PA-C at which point she was doing well at the time.  Repeat echocardiogram obtained on 09/11/2016 showed EF 60 to 65%, grade 1 DD, mild AI, dilated ascending aorta at 40 mm, and mild MR.  In 2019 she was  admitted multiple times for small bowel obstruction and abdominal pain.  The last admission was in June 2019.  Work-up included a CT scan that showed partial small bowel obstruction.  Patient was seen in the urgent care on 08/14/2017 with increased episode of PVCs. Last follow up here in July 2019 at which time metoprolol was increased.   The patient {does/does not:200015} have symptoms concerning for COVID-19 infection (fever, chills, cough, or new shortness of breath).    Past Medical History:  Diagnosis Date  . Anemia    history in the past, not on a regular basis  . Arthritis   . Bronchitis   . Cataracts, bilateral    immature  .  Claustrophobia    takes Valium daily as needed  . Complication of anesthesia    2013 SURGERY vocal cord bothering pt, used smaller ent tube after 2 hernia repair, no problem after . " I woke up during my MRI and colonoscopy."       . Depression    takes Wellbutrin daily  . Dry eye   . Dry mouth   . GERD (gastroesophageal reflux disease)    takes Omeprazole daily   . H/O hiatal hernia   . Headache    migraines with flashing lights   . History of blood clots 40 yrs    leg   . History of echocardiogram 2013   a. Echo (05/2011): Mild LVH, EF 65-70%, no WMA, Gr 1 DD, mild AI.  Marland Kitchen History of MRSA infection   . History of staph infection   . History of stress test 2010   a. ETT-Echo in 2010 was normal  . History of TIA (transient ischemic attack) 2013   a. Carotid US (05/2011): No ICA stenosis, pt unsure of this  . Hyperlipidemia    takes Simvastatin daily   . Hypertension    takes  Losartan daily  . IBS (irritable bowel syndrome)    takes Bentyl daily as needed  . Insomnia    takes Ambien nightly  . Interstitial cystitis   . Lichen planus   . Mild asthma    Albuterol inhaler as needed  .  Palpitations    Event monitor in 2007 with rare PVCs // takes Metoprolol daily  . PVC (premature ventricular contraction)   . Seasonal allergies    takes Claritin daily as needed.Takes Singulair daily as needed.  Marland Kitchen TIA (transient ischemic attack) 05/27/2011  . Urethral stricture   . Vertigo    takes Meclizine daily as needed   Past Surgical History:  Procedure Laterality Date  . CESAREAN SECTION    . COLONOSCOPY WITH PROPOFOL N/A 08/10/2012   Procedure: COLONOSCOPY WITH PROPOFOL;  Surgeon: Garlan Fair, MD;  Location: [facility];  Service: Endoscopy;  Laterality: N/A;  . CYSTOSCOPY WITH URETHRAL DILATATION N/A 02/18/2013   Procedure: CYSTOSCOPY WITH URETHRAL DILATATION ( NO BALLOON) AND HYDRODISTENSION;  Surgeon: Alexis Frock, MD;  Location: [facility];  Service:  Urology;  Laterality: N/A;  . ENDOMETRIAL ABLATION W/ HYDROTHERMABLATOR  09-27-2002  . INCISIONAL HERNIA REPAIR N/A 10/25/2014   Procedure: HERNIA REPAIR INCISIONAL;  Surgeon: Ralene Ok, MD;  Location: [place];  Service: General;  Laterality: N/A;  . INCISIONAL HERNIA REPAIR N/A 03/26/2015   Procedure: LAPAROSCOPIC INCISIONAL HERNIA;  Surgeon: Ralene Ok, MD;  Location: [place];  Service: General;  Laterality: N/A;  . INSERTION OF MESH N/A 10/25/2014   Procedure: INSERTION OF MESH;  Surgeon: Ralene Ok, MD;  Location: [place];  Service: General;  Laterality: N/A;  . LAPAROSCOPIC LYSIS OF ADHESIONS N/A 03/26/2015   Procedure: LAPAROSCOPIC LYSIS OF ADHESIONS;  Surgeon: Ralene Ok, MD;  Location: [place];  Service: General;  Laterality: N/A;  . LAPAROTOMY N/A 10/25/2014   Procedure: EXPLORATORY LAPAROTOMY;  Surgeon: Ralene Ok, MD;  Location: [place];  Service: General;  Laterality: N/A;  . LYSIS OF ADHESION N/A 10/25/2014   Procedure: LYSIS OF ADHESIONS ;  Surgeon: Ralene Ok, MD;  Location: [place];  Service: General;  Laterality: N/A;  . MUCOSAL ADVANCEMENT FLAP N/A 10/25/2014   Procedure: MUCOSAL ADVANCEMENT FLAP;  Surgeon: Ralene Ok, MD;  Location: [place];  Service: General;  Laterality: N/A;  . ORIF RADIAL FRACTURE Right 06/17/2016   Procedure: right radial head arthroplasty with ligament repair, reconstruciton;  Surgeon: Roseanne Kaufman, MD;  Location: [place];  Service: Orthopedics;  Laterality: Right;  . RADIAL HEAD ARTHROPLASTY Right 06/17/2016  . TOOTH EXTRACTION    . TRANSTHORACIC ECHOCARDIOGRAM  05-26-2011   MILD LVH/  EF 123456  GRADE I DIASTOLIC DYSFUNCTION/  MILD AR//   06-20-2008  NOMRAL STRESS ECHO  . UMBILICAL HERNIA REPAIR  2013   AND REVISION THE SAME YEAR W/ MESH  . WISDOM TOOTH EXTRACTION  AGE 33     No outpatient medications have been marked as taking for the 06/17/19 encounter (Appointment) with Martinique, Hogan Hoobler M, MD.     Allergies:   Hydrocodone,  Hydrocodone-acetaminophen, and Tramadol   Social History   Tobacco Use  . Smoking status: Former Smoker    Packs/day: 0.25    Years: 2.00    Pack years: 0.50    Types: Cigarettes    Quit date: 02/04/1976    Years since quitting: 43.3  . Smokeless tobacco: Never Used  Substance Use Topics  . Alcohol use: Yes    Comment: occasional  . Drug use: No     Family Hx: The patient's family history includes Cirrhosis in her father; Dementia in her mother; Leukemia in her child.  ROS:   Please see the history of present illness.    *** All other systems reviewed and are negative.   Prior  CV studies:   The following studies were reviewed today:  Echo 09/11/2016 LV EF: 60% - 65% Study Conclusions  - Left ventricle: The cavity size was normal. Systolic function was normal. The estimated ejection fraction was in the range of 60% to 65%. Wall motion was normal; there were no regional wall motion abnormalities. There was an increased relative contribution of atrial contraction to ventricular filling. Doppler parameters are consistent with abnormal left ventricular relaxation (grade 1 diastolic dysfunction). - Aortic valve: Trileaflet; normal thickness, mildly calcified leaflets. There was mild regurgitation directed towards the mitral anterior leaflet. Regurgitation pressure half-time: 520 ms. - Aorta: Ascending aorta diameter: 40 mm (ED). - Ascending aorta: The ascending aorta was mildly dilated. - Mitral valve: There was mild regurgitation. - Pulmonic valve: There was mild regurgitation.  Labs/Other Tests and Data Reviewed:    EKG:  {EKG/Telemetry Strips Reviewed:[phone]}  Recent Labs: 11/17/2018: ALT 29; BUN 17; Creatinine, Ser 1.01; Hemoglobin 15.7; Platelets 262; Potassium 4.2; Sodium 138   Recent Lipid Panel Lab Results  Component Value Date/Time   CHOL 192 05/26/2011 12:45 PM   TRIG 122 05/26/2011 12:45 PM   HDL 65 05/26/2011 12:45 PM    CHOLHDL 3.0 05/26/2011 12:45 PM   LDLCALC 103 (H) 05/26/2011 12:45 PM   Dated 11/26/18: Normal CMET and CBC. Cholesterol 172, triglycerides 178, HDL 59, LDL 91. Wt Readings from Last 3 Encounters:  04/03/19 163 lb (73.9 kg)  08/21/17 163 lb 3.2 oz (74 kg)  07/16/17 169 lb (76.7 kg)     Objective:    Vital Signs:  There were no vitals taken for this visit.   {[facility] Virtual Exam (Optional):[phone]::"VITAL SIGNS:  reviewed"}  ASSESSMENT & PLAN:    1. PVCs: She has self increased her metoprolol to 100 mg twice daily.  I am unable to increase metoprolol any further due to baseline bradycardia.  She has been instructed to take additional 50 mg on a as needed basis if she does have increased palpitation.  2. Hypertension: Blood pressure mildly elevated, add amlodipine 2.5 mg daily.  3. Hyperlipidemia: Zocor 40 mg daily.  Annual lipid panel followed by primary care provider.  4. Thoracic aortic aneurysm: 40 mm ascending aorta on previous echocardiogram in 2018.  Consider CTA of the chest on next follow-up to reassess thoracic aortic aneurysm.   COVID-19 Education: The signs and symptoms of COVID-19 were discussed with the patient and how to seek care for testing (follow up with PCP or arrange E-visit).  ***The importance of social distancing was discussed today.  Time:   Today, I have spent *** minutes with the patient with telehealth technology discussing the above problems.     Medication Adjustments/Labs and Tests Ordered: Current medicines are reviewed at length with the patient today.  Concerns regarding medicines are outlined above.   Tests Ordered: No orders of the defined types were placed in this encounter.   Medication Changes: No orders of the defined types were placed in this encounter.   Follow Up:  {F/U Format:[phone]} {follow up:15908}  Signed, Nameer Summer Martinique, MD  06/16/2019 5:01 PM    [facility]

## 2019-06-17 ENCOUNTER — Telehealth: Payer: Medicare PPO | Admitting: Cardiology

## 2019-06-17 ENCOUNTER — Other Ambulatory Visit: Payer: Self-pay | Admitting: Cardiology

## 2019-06-17 DIAGNOSIS — I493 Ventricular premature depolarization: Secondary | ICD-10-CM

## 2019-09-19 ENCOUNTER — Other Ambulatory Visit: Payer: Self-pay | Admitting: Cardiology

## 2019-09-19 DIAGNOSIS — I493 Ventricular premature depolarization: Secondary | ICD-10-CM

## 2019-10-18 ENCOUNTER — Other Ambulatory Visit: Payer: Self-pay | Admitting: Cardiology

## 2019-10-18 DIAGNOSIS — I493 Ventricular premature depolarization: Secondary | ICD-10-CM

## 2019-11-14 ENCOUNTER — Other Ambulatory Visit: Payer: Self-pay | Admitting: Cardiology

## 2019-11-14 DIAGNOSIS — I493 Ventricular premature depolarization: Secondary | ICD-10-CM

## 2019-11-19 ENCOUNTER — Other Ambulatory Visit: Payer: Self-pay | Admitting: Cardiology

## 2019-11-19 DIAGNOSIS — I493 Ventricular premature depolarization: Secondary | ICD-10-CM

## 2019-11-23 ENCOUNTER — Telehealth (INDEPENDENT_AMBULATORY_CARE_PROVIDER_SITE_OTHER): Payer: Medicare PPO | Admitting: Medical

## 2019-11-23 ENCOUNTER — Encounter: Payer: Self-pay | Admitting: Medical

## 2019-11-23 VITALS — BP 131/74 | HR 61 | Ht 64.0 in | Wt 165.0 lb

## 2019-11-23 DIAGNOSIS — I712 Thoracic aortic aneurysm, without rupture, unspecified: Secondary | ICD-10-CM

## 2019-11-23 DIAGNOSIS — I493 Ventricular premature depolarization: Secondary | ICD-10-CM

## 2019-11-23 DIAGNOSIS — E785 Hyperlipidemia, unspecified: Secondary | ICD-10-CM | POA: Diagnosis not present

## 2019-11-23 DIAGNOSIS — I38 Endocarditis, valve unspecified: Secondary | ICD-10-CM

## 2019-11-23 DIAGNOSIS — I10 Essential (primary) hypertension: Secondary | ICD-10-CM | POA: Diagnosis not present

## 2019-11-23 MED ORDER — POTASSIUM CHLORIDE CRYS ER 20 MEQ PO TBCR: EXTENDED_RELEASE_TABLET | ORAL | 0 refills | Status: DC

## 2019-11-23 MED ORDER — AMLODIPINE BESYLATE 2.5 MG PO TABS: 2.5000 mg | ORAL_TABLET | Freq: Two times a day (BID) | ORAL | 3 refills | Status: DC

## 2019-11-23 NOTE — Progress Notes (Signed)
Virtual Visit via Telephone Note   This visit type was conducted due to national recommendations for restrictions regarding the COVID-19 Pandemic (e.g. social distancing) in an effort to limit this patient's exposure and mitigate transmission in our community.  Due to her co-morbid illnesses, this patient is at least at moderate risk for complications without adequate follow up.  This format is felt to be most appropriate for this patient at this time.  The patient did not have access to video technology/had technical difficulties with video requiring transitioning to audio format only (telephone).  All issues noted in this document were discussed and addressed.  No physical exam could be performed with this format.  Please refer to the patient's chart for her  consent to telehealth for [facility].    Date:  11/23/2019   ID:  Cynthia Peters, DOB Feb 21, 1950, MRN [id number] The patient was identified using 2 identifiers.  Patient Location: Home Provider Location: Home Office  PCP:  Aletha Halim., PA-C  Cardiologist:  Peter Martinique, MD  Electrophysiologist:  None   Evaluation Performed:  Follow-Up Visit  Chief Complaint:  Routine follow-up  History of Present Illness:    Cynthia Peters is a 69 y.o. female with a PMH of HTN, HLD, palpitations, thoracic aortic aneurysm, and GERD, who presents today for routine follow-up.  She was last evaluated by cardiology at an outpatient visit with Almyra Deforest, PA-C 08/2017, at which time she noted improvement in her palpitations after self-increasing her metoprolol succinate to 100mg  BID. She was started on amlodipine 2.5mg  daily for improved BP control and recommended to follow-up with Dr. Martinique in 6 months with plans to consider a surveillance CTA for monitoring of her thoracic aortic aneurysm, though she has not been seen since that time. Her last echocardiogram in 2018 showed EF 60-65%, G1DD, no RWMA, mild AI, mild MR, mild PI, and a  69mm ascending aortic aneurysm.   She presents today via telephone for routine follow-up. She reports doing fine from a cardiac standpoint since her last visit. Recently had some trouble with elevated blood pressures, though this has been better since increasing her amlodipine to 2.5mg  BID per her PCP's recommendations. SBP is generally in the 120s-130s. She reports palpitations have been stable - she notices a big difference if she is not taking her potassium supplement. She is working to increase her walking activity. No complaints of chest pain, SOB, lightheadedness, syncope, LE edema, orthopnea, or PND. She reports ~2 episodes of vertigo a year which are improved with meclizine.   The patient does not have symptoms concerning for COVID-19 infection (fever, chills, cough, or new shortness of breath). She received her COVID-19 booster and anticipates getting her flu shot at her PCP visit in 1-2 weeks.    Past Medical History:  Diagnosis Date  . Anemia    history in the past, not on a regular basis  . Arthritis   . Bronchitis   . Cataracts, bilateral    immature  . Claustrophobia    takes Valium daily as needed  . Complication of anesthesia    2013 SURGERY vocal cord bothering pt, used smaller ent tube after 2 hernia repair, no problem after . " I woke up during my MRI and colonoscopy."       . Depression    takes Wellbutrin daily  . Dry eye   . Dry mouth   . GERD (gastroesophageal reflux disease)    takes Omeprazole daily   . H/O  hiatal hernia   . Headache    migraines with flashing lights   . History of blood clots 40 yrs    leg   . History of echocardiogram 2013   a. Echo (05/2011): Mild LVH, EF 65-70%, no WMA, Gr 1 DD, mild AI.  Marland Kitchen History of MRSA infection   . History of staph infection   . History of stress test 2010   a. ETT-Echo in 2010 was normal  . History of TIA (transient ischemic attack) 2013   a. Carotid US (05/2011): No ICA stenosis, pt unsure of this  .  Hyperlipidemia    takes Simvastatin daily   . Hypertension    takes  Losartan daily  . IBS (irritable bowel syndrome)    takes Bentyl daily as needed  . Insomnia    takes Ambien nightly  . Interstitial cystitis   . Lichen planus   . Mild asthma    Albuterol inhaler as needed  . Palpitations    Event monitor in 2007 with rare PVCs // takes Metoprolol daily  . PVC (premature ventricular contraction)   . Seasonal allergies    takes Claritin daily as needed.Takes Singulair daily as needed.  Marland Kitchen TIA (transient ischemic attack) 05/27/2011  . Urethral stricture   . Vertigo    takes Meclizine daily as needed   Past Surgical History:  Procedure Laterality Date  . CESAREAN SECTION    . COLONOSCOPY WITH PROPOFOL N/A 08/10/2012   Procedure: COLONOSCOPY WITH PROPOFOL;  Surgeon: Garlan Fair, MD;  Location: [facility];  Service: Endoscopy;  Laterality: N/A;  . CYSTOSCOPY WITH URETHRAL DILATATION N/A 02/18/2013   Procedure: CYSTOSCOPY WITH URETHRAL DILATATION ( NO BALLOON) AND HYDRODISTENSION;  Surgeon: Alexis Frock, MD;  Location: [facility];  Service: Urology;  Laterality: N/A;  . ENDOMETRIAL ABLATION W/ HYDROTHERMABLATOR  09-27-2002  . INCISIONAL HERNIA REPAIR N/A 10/25/2014   Procedure: HERNIA REPAIR INCISIONAL;  Surgeon: Ralene Ok, MD;  Location: [place];  Service: General;  Laterality: N/A;  . INCISIONAL HERNIA REPAIR N/A 03/26/2015   Procedure: LAPAROSCOPIC INCISIONAL HERNIA;  Surgeon: Ralene Ok, MD;  Location: [place];  Service: General;  Laterality: N/A;  . INSERTION OF MESH N/A 10/25/2014   Procedure: INSERTION OF MESH;  Surgeon: Ralene Ok, MD;  Location: [place];  Service: General;  Laterality: N/A;  . LAPAROSCOPIC LYSIS OF ADHESIONS N/A 03/26/2015   Procedure: LAPAROSCOPIC LYSIS OF ADHESIONS;  Surgeon: Ralene Ok, MD;  Location: [place];  Service: General;  Laterality: N/A;  . LAPAROTOMY N/A 10/25/2014   Procedure: EXPLORATORY LAPAROTOMY;  Surgeon:  Ralene Ok, MD;  Location: [place];  Service: General;  Laterality: N/A;  . LYSIS OF ADHESION N/A 10/25/2014   Procedure: LYSIS OF ADHESIONS ;  Surgeon: Ralene Ok, MD;  Location: [place];  Service: General;  Laterality: N/A;  . MUCOSAL ADVANCEMENT FLAP N/A 10/25/2014   Procedure: MUCOSAL ADVANCEMENT FLAP;  Surgeon: Ralene Ok, MD;  Location: [place];  Service: General;  Laterality: N/A;  . ORIF RADIAL FRACTURE Right 06/17/2016   Procedure: right radial head arthroplasty with ligament repair, reconstruciton;  Surgeon: Roseanne Kaufman, MD;  Location: [place];  Service: Orthopedics;  Laterality: Right;  . RADIAL HEAD ARTHROPLASTY Right 06/17/2016  . TOOTH EXTRACTION    . TRANSTHORACIC ECHOCARDIOGRAM  05-26-2011   MILD LVH/  EF 93-81%/  GRADE I DIASTOLIC DYSFUNCTION/  MILD AR//   06-20-2008  NOMRAL STRESS ECHO  . UMBILICAL HERNIA REPAIR  2013   AND REVISION THE  SAME YEAR W/ MESH  . WISDOM TOOTH EXTRACTION  AGE 59     Current Meds  Medication Sig  . acetaminophen (TYLENOL) 500 MG tablet Take 1,000 mg by mouth every 6 (six) hours as needed (pain).   Marland Kitchen albuterol (PROVENTIL HFA;VENTOLIN HFA) 108 (90 BASE) MCG/ACT inhaler Inhale 2 puffs into the lungs every 6 (six) hours as needed for wheezing or shortness of breath.  Marland Kitchen amLODipine (NORVASC) 2.5 MG tablet Take 1 tablet (2.5 mg total) by mouth 2 (two) times daily.  Marland Kitchen aspirin 81 MG chewable tablet Chew 81 mg by mouth daily.  Marland Kitchen buPROPion (WELLBUTRIN XL) 300 MG 24 hr tablet Take 300 mg by mouth daily.   . Cholecalciferol (VITAMIN D) 2000 units CAPS Take 2,000 Units by mouth daily.  . clobetasol cream (TEMOVATE) 4.03 % Apply 1 application topically 2 (two) times daily as needed (rash).   . Coenzyme Q10 (CO Q 10) 100 MG CAPS Take 100 mg by mouth daily.   . CVS VITAMIN C 1000 MG tablet Take 2,000 mg by mouth 2 (two) times daily.   Marland Kitchen dicyclomine (BENTYL) 10 MG capsule Take 10 mg by mouth every 4 (four) hours as needed (for abdominal cramps).   .  Evening Primrose Oil 1000 MG CAPS Take 1,000 mg by mouth daily.   Marland Kitchen Fexofenadine HCl (ALLEGRA PO) Take 1 tablet by mouth daily as needed (ALLERGIES).  . Flaxseed, Linseed, 1000 MG CAPS Take 1,000 mg by mouth 2 (two) times daily.   . fluticasone (FLONASE) 50 MCG/ACT nasal spray Place 2 sprays into both nostrils daily as needed for allergies or rhinitis.   . hydrochlorothiazide 25 MG tablet Take 25 mg by mouth daily.   Marland Kitchen HYDROCORTISONE ACE, RECTAL, 30 MG SUPP Place 30 mg rectally 2 (two) times daily as needed for hemorrhoids.  Marland Kitchen ibuprofen (ADVIL,MOTRIN) 200 MG tablet Take 400-600 mg by mouth 2 (two) times daily as needed (pain).   Marland Kitchen losartan (COZAAR) 100 MG tablet TAKE 1 TABLET BY MOUTH EVERY DAY (Patient taking differently: Take 100 mg by mouth daily. )  . meclizine (ANTIVERT) 25 MG tablet Take 1 tablet (25 mg total) by mouth 3 (three) times daily as needed for dizziness.  . metoprolol succinate (TOPROL-XL) 100 MG 24 hr tablet Take 1 tablet (100 mg total) by mouth 2 (two) times daily. Take with or immediately following a meal. Take 100 mg by mouth 2 (two) times daily. Take with or immediately following a meal.  . montelukast (SINGULAIR) 10 MG tablet Take 10 mg by mouth daily.   . Multiple Vitamin (MULTIVITAMIN WITH MINERALS) TABS Take 1 tablet by mouth daily.  Marland Kitchen nystatin (MYCOSTATIN) 100000 UNIT/ML suspension Take 5 mLs by mouth daily as needed (FLARES, LICHEN PLANTIS).  Marland Kitchen nystatin cream (MYCOSTATIN) Apply 1 application topically 2 (two) times daily as needed for dry skin.  Marland Kitchen omeprazole (PRILOSEC) 20 MG capsule Take 1 capsule (20 mg total) by mouth daily.  . ondansetron (ZOFRAN ODT) 4 MG disintegrating tablet Take 1 tablet (4 mg total) by mouth every 8 (eight) hours as needed for nausea or vomiting.  . pantoprazole (PROTONIX) 20 MG tablet Take 20 mg by mouth daily.  . phenazopyridine (PYRIDIUM) 200 MG tablet Take 200 mg by mouth 3 (three) times daily as needed for pain [place] TRACT PAIN).  Marland Kitchen  potassium chloride SA (KLOR-CON M20) 20 MEQ tablet TAKE 1&1/2 TABLETS BY MOUTH DAILY. PLEASE SCHEDULE ANNUAL APPT WITH DR. Martinique FOR REFILLS  . PREVIDENT 5000 DRY MOUTH 1.1 %  GEL dental gel Place 1 application onto teeth at bedtime.   . Probiotic Product (ALIGN PO) Take 1 capsule by mouth 2 (two) times daily.   . simvastatin (ZOCOR) 40 MG tablet Take 40 mg by mouth daily.   . valACYclovir (VALTREX) 500 MG tablet INCREASE TO TAKING 1 TABLET 2 TIMES A DAY FOR 3 DAYS WITH OUTBREAK (Patient taking differently: Take 500 mg by mouth daily. )  . zolpidem (AMBIEN) 10 MG tablet Take 10 mg by mouth at bedtime as needed for sleep.  . [DISCONTINUED] amLODipine (NORVASC) 2.5 MG tablet Take 2.5 mg by mouth daily.  . [DISCONTINUED] KLOR-CON M20 20 MEQ tablet TAKE 1&1/2 TABLETS BY MOUTH DAILY. PLEASE SCHEDULE ANNUAL APPT WITH DR. Martinique FOR REFILLS  . [DISCONTINUED] metoprolol succinate (TOPROL-XL) 100 MG 24 hr tablet Take 1 tablet in the morning and 1 (50 mg) in the evening. Take with or immediately following a meal. (Patient taking differently: Take 100 mg by mouth 2 (two) times daily. )     Allergies:   Hydrocodone, Hydrocodone-acetaminophen, and Tramadol   Social History   Tobacco Use  . Smoking status: Former Smoker    Packs/day: 0.25    Years: 2.00    Pack years: 0.50    Types: Cigarettes    Quit date: 02/04/1976    Years since quitting: 43.8  . Smokeless tobacco: Never Used  Vaping Use  . Vaping Use: Never used  Substance Use Topics  . Alcohol use: Yes    Comment: occasional  . Drug use: No     Family Hx: The patient's family history includes Cirrhosis in her father; Dementia in her mother; Leukemia in her child.  ROS:   Please see the history of present illness.     All other systems reviewed and are negative.   Prior CV studies:   The following studies were reviewed today:  Echocardiogram 2018: - Left ventricle: The cavity size was normal. Systolic function was  normal. The  estimated ejection fraction was in the range of 60%  to 65%. Wall motion was normal; there were no regional wall  motion abnormalities. There was an increased relative  contribution of atrial contraction to ventricular filling.  Doppler parameters are consistent with abnormal left ventricular  relaxation (grade 1 diastolic dysfunction).  - Aortic valve: Trileaflet; normal thickness, mildly calcified  leaflets. There was mild regurgitation directed towards the  mitral anterior leaflet. Regurgitation pressure half-time: 520  ms.  - Aorta: Ascending aorta diameter: 40 mm (ED).  - Ascending aorta: The ascending aorta was mildly dilated.  - Mitral valve: There was mild regurgitation.  - Pulmonic valve: There was mild regurgitation.   Labs/Other Tests and Data Reviewed:    EKG:  No ECG reviewed.  Recent Labs: No results found for requested labs within last 8760 hours.   Recent Lipid Panel Lab Results  Component Value Date/Time   CHOL 192 05/26/2011 12:45 PM   TRIG 122 05/26/2011 12:45 PM   HDL 65 05/26/2011 12:45 PM   CHOLHDL 3.0 05/26/2011 12:45 PM   LDLCALC 103 (H) 05/26/2011 12:45 PM    Wt Readings from Last 3 Encounters:  11/23/19 165 lb (74.8 kg)  04/03/19 163 lb (73.9 kg)  08/21/17 163 lb 3.2 oz (74 kg)     Risk Assessment/Calculations:      Objective:    Vital Signs:  BP 131/74   Pulse 61   Ht 5\' 4"  (1.626 m)   Wt 165 lb (74.8 kg)   BMI  28.32 kg/m    VITAL SIGNS:  reviewed GEN:  no acute distress RESPIRATORY:  speaking in full sentences without SOB CARDIOVASCULAR:  no peripheral edema NEURO:  A&O x3 PSYCH:  normal affect  ASSESSMENT & PLAN:     1. HTN: BP 131/74 today - Continue amlodipine, hydrochlorothiazide, losartan, and metoprolol succinate  2. HLD: Followed by PCP; no recent lipids on file - Continue simvastatin  3. Palpitations: stable without any persistent symptoms - Continue metoprolol succinate  4. Thoracic aortic  aneurysm: 68mm on last echo in 2018 - Will update an echocardiogram at this time for routine monitoring  5. Valvulopathy: mild AI, MR, and PI on last echo in 2018. Not describing symptoms c/w significant change from previous. - Will update an echocardiogram at this time for routine monitoring   COVID-19 Education: The signs and symptoms of COVID-19 were discussed with the patient and how to seek care for testing (follow up with PCP or arrange E-visit).  The importance of social distancing was discussed today.  Time:   Today, I have spent 11 minutes with the patient with telehealth technology discussing the above problems.     Medication Adjustments/Labs and Tests Ordered: Current medicines are reviewed at length with the patient today.  Concerns regarding medicines are outlined above.   Tests Ordered: Orders Placed This Encounter  Procedures  . ECHOCARDIOGRAM COMPLETE    Medication Changes: Meds ordered this encounter  Medications  . amLODipine (NORVASC) 2.5 MG tablet    Sig: Take 1 tablet (2.5 mg total) by mouth 2 (two) times daily.    Dispense:  60 tablet    Refill:  3  . potassium chloride SA (KLOR-CON M20) 20 MEQ tablet    Sig: TAKE 1&1/2 TABLETS BY MOUTH DAILY. PLEASE SCHEDULE ANNUAL APPT WITH DR. Martinique FOR REFILLS    Dispense:  45 tablet    Refill:  0    Patient will need an appointment for further refills, Thanks !    Follow Up:  In Person with Dr. Martinique in 6 months.  Signed, Abigail Butts, PA-C  11/23/2019 4:21 PM    [facility]

## 2019-11-23 NOTE — Patient Instructions (Signed)
Medication Instructions:  No Changes In Medications at this time.  *If you need a refill on your cardiac medications before your next appointment, please call your pharmacy*  Lab Work: None Ordered At This Time.  If you have labs (blood work) drawn today and your tests are completely normal, you will receive your results only by: Marland Kitchen MyChart Message (if you have MyChart) OR . A paper copy in the mail If you have any lab test that is abnormal or we need to change your treatment, we will call you to review the results.  Testing/Procedures: Your physician has requested that you have an echocardiogram. Echocardiography is a painless test that uses sound waves to create images of your heart. It provides your doctor with information about the size and shape of your heart and how well your heart's chambers and valves are working. You may receive an ultrasound enhancing agent through an IV if needed to better visualize your heart during the echo.This procedure takes approximately one hour. There are no restrictions for this procedure. This will take place at the [address], [address].   Follow-Up: At [facility], you and your health needs are our priority.  As part of our continuing mission to provide you with exceptional heart care, we have created designated Provider Care Teams.  These Care Teams include your primary Cardiologist (physician) and Advanced Practice Providers (APPs -  Physician Assistants and Nurse Practitioners) who all work together to provide you with the care you need, when you need it.  Your next appointment:   6 month(s)  The format for your next appointment:   In Person  Provider:   Peter Martinique, MD  Other Instructions

## 2019-11-27 ENCOUNTER — Encounter (HOSPITAL_BASED_OUTPATIENT_CLINIC_OR_DEPARTMENT_OTHER): Payer: Self-pay | Admitting: Emergency Medicine

## 2019-11-27 ENCOUNTER — Inpatient Hospital Stay (HOSPITAL_BASED_OUTPATIENT_CLINIC_OR_DEPARTMENT_OTHER)
Admission: EM | Admit: 2019-11-27 | Discharge: 2019-11-30 | DRG: 390 | Disposition: A | Discharge status: Home / Self Care | Payer: Medicare PPO | Attending: Internal Medicine | Admitting: Internal Medicine

## 2019-11-27 ENCOUNTER — Other Ambulatory Visit: Payer: Self-pay

## 2019-11-27 ENCOUNTER — Emergency Department (HOSPITAL_BASED_OUTPATIENT_CLINIC_OR_DEPARTMENT_OTHER): Payer: Medicare PPO

## 2019-11-27 ENCOUNTER — Observation Stay (HOSPITAL_COMMUNITY): Payer: Medicare PPO

## 2019-11-27 DIAGNOSIS — R17 Unspecified jaundice: Secondary | ICD-10-CM | POA: Diagnosis not present

## 2019-11-27 DIAGNOSIS — E785 Hyperlipidemia, unspecified: Secondary | ICD-10-CM | POA: Diagnosis not present

## 2019-11-27 DIAGNOSIS — G4700 Insomnia, unspecified: Secondary | ICD-10-CM | POA: Diagnosis not present

## 2019-11-27 DIAGNOSIS — K567 Ileus, unspecified: Secondary | ICD-10-CM

## 2019-11-27 DIAGNOSIS — K219 Gastro-esophageal reflux disease without esophagitis: Secondary | ICD-10-CM | POA: Diagnosis not present

## 2019-11-27 DIAGNOSIS — Z8673 Personal history of transient ischemic attack (TIA), and cerebral infarction without residual deficits: Secondary | ICD-10-CM

## 2019-11-27 DIAGNOSIS — J302 Other seasonal allergic rhinitis: Secondary | ICD-10-CM | POA: Diagnosis not present

## 2019-11-27 DIAGNOSIS — Z6830 Body mass index (BMI) 30.0-30.9, adult: Secondary | ICD-10-CM

## 2019-11-27 DIAGNOSIS — R682 Dry mouth, unspecified: Secondary | ICD-10-CM | POA: Diagnosis present

## 2019-11-27 DIAGNOSIS — J45909 Unspecified asthma, uncomplicated: Secondary | ICD-10-CM | POA: Diagnosis not present

## 2019-11-27 DIAGNOSIS — R109 Unspecified abdominal pain: Secondary | ICD-10-CM

## 2019-11-27 DIAGNOSIS — Z20822 Contact with and (suspected) exposure to covid-19: Secondary | ICD-10-CM | POA: Diagnosis not present

## 2019-11-27 DIAGNOSIS — Z7982 Long term (current) use of aspirin: Secondary | ICD-10-CM

## 2019-11-27 DIAGNOSIS — E782 Mixed hyperlipidemia: Secondary | ICD-10-CM | POA: Diagnosis present

## 2019-11-27 DIAGNOSIS — Z23 Encounter for immunization: Secondary | ICD-10-CM

## 2019-11-27 DIAGNOSIS — M1990 Unspecified osteoarthritis, unspecified site: Secondary | ICD-10-CM | POA: Diagnosis present

## 2019-11-27 DIAGNOSIS — F40240 Claustrophobia: Secondary | ICD-10-CM | POA: Diagnosis present

## 2019-11-27 DIAGNOSIS — R002 Palpitations: Secondary | ICD-10-CM | POA: Diagnosis present

## 2019-11-27 DIAGNOSIS — Z885 Allergy status to narcotic agent status: Secondary | ICD-10-CM

## 2019-11-27 DIAGNOSIS — N3010 Interstitial cystitis (chronic) without hematuria: Secondary | ICD-10-CM | POA: Diagnosis not present

## 2019-11-27 DIAGNOSIS — E669 Obesity, unspecified: Secondary | ICD-10-CM | POA: Diagnosis present

## 2019-11-27 DIAGNOSIS — K56600 Partial intestinal obstruction, unspecified as to cause: Secondary | ICD-10-CM | POA: Diagnosis not present

## 2019-11-27 DIAGNOSIS — R8271 Bacteriuria: Secondary | ICD-10-CM

## 2019-11-27 DIAGNOSIS — I712 Thoracic aortic aneurysm, without rupture, unspecified: Secondary | ICD-10-CM

## 2019-11-27 DIAGNOSIS — Z86718 Personal history of other venous thrombosis and embolism: Secondary | ICD-10-CM

## 2019-11-27 DIAGNOSIS — F419 Anxiety disorder, unspecified: Secondary | ICD-10-CM | POA: Diagnosis present

## 2019-11-27 DIAGNOSIS — Z8614 Personal history of Methicillin resistant Staphylococcus aureus infection: Secondary | ICD-10-CM

## 2019-11-27 DIAGNOSIS — K56609 Unspecified intestinal obstruction, unspecified as to partial versus complete obstruction: Secondary | ICD-10-CM | POA: Diagnosis present

## 2019-11-27 DIAGNOSIS — K589 Irritable bowel syndrome without diarrhea: Secondary | ICD-10-CM | POA: Diagnosis present

## 2019-11-27 DIAGNOSIS — I10 Essential (primary) hypertension: Secondary | ICD-10-CM

## 2019-11-27 DIAGNOSIS — I493 Ventricular premature depolarization: Secondary | ICD-10-CM | POA: Diagnosis not present

## 2019-11-27 DIAGNOSIS — Z87891 Personal history of nicotine dependence: Secondary | ICD-10-CM

## 2019-11-27 DIAGNOSIS — Z79899 Other long term (current) drug therapy: Secondary | ICD-10-CM

## 2019-11-27 DIAGNOSIS — F32A Depression, unspecified: Secondary | ICD-10-CM | POA: Diagnosis present

## 2019-11-27 DIAGNOSIS — R739 Hyperglycemia, unspecified: Secondary | ICD-10-CM | POA: Diagnosis not present

## 2019-11-27 LAB — URINALYSIS, MICROSCOPIC (REFLEX)

## 2019-11-27 LAB — CBC
HCT: 39.4 % (ref 36.0–46.0)
Hemoglobin: 13.6 g/dL (ref 12.0–15.0)
MCH: 33.3 pg (ref 26.0–34.0)
MCHC: 34.5 g/dL (ref 30.0–36.0)
MCV: 96.6 fL (ref 80.0–100.0)
Platelets: 241 10*3/uL (ref 150–400)
RBC: 4.08 MIL/uL (ref 3.87–5.11)
RDW: 12.2 % (ref 11.5–15.5)
WBC: 13.2 10*3/uL — ABNORMAL HIGH (ref 4.0–10.5)
nRBC: 0 % (ref 0.0–0.2)

## 2019-11-27 LAB — COMPREHENSIVE METABOLIC PANEL
ALT: 24 U/L (ref 0–44)
AST: 27 U/L (ref 15–41)
Albumin: 4.2 g/dL (ref 3.5–5.0)
Alkaline Phosphatase: 59 U/L (ref 38–126)
Anion gap: 11 (ref 5–15)
BUN: 24 mg/dL — ABNORMAL HIGH (ref 8–23)
CO2: 27 mmol/L (ref 22–32)
Calcium: 9 mg/dL (ref 8.9–10.3)
Chloride: 100 mmol/L (ref 98–111)
Creatinine, Ser: 0.82 mg/dL (ref 0.44–1.00)
GFR, Estimated: >60 mL/min (ref >60)
Glucose, Bld: 140 mg/dL — ABNORMAL HIGH (ref 70–99)
Potassium: 3.8 mmol/L (ref 3.5–5.1)
Sodium: 138 mmol/L (ref 135–145)
Total Bilirubin: 1.3 mg/dL — ABNORMAL HIGH (ref 0.3–1.2)
Total Protein: 6.8 g/dL (ref 6.5–8.1)

## 2019-11-27 LAB — HEPATIC FUNCTION PANEL
ALT: 23 U/L (ref 0–44)
AST: 24 U/L (ref 15–41)
Albumin: 3.9 g/dL (ref 3.5–5.0)
Alkaline Phosphatase: 53 U/L (ref 38–126)
Bilirubin, Direct: 0.3 mg/dL — ABNORMAL HIGH (ref 0.0–0.2)
Indirect Bilirubin: 0.7 mg/dL (ref 0.3–0.9)
Total Bilirubin: 1 mg/dL (ref 0.3–1.2)
Total Protein: 6 g/dL — ABNORMAL LOW (ref 6.5–8.1)

## 2019-11-27 LAB — CBC WITH DIFFERENTIAL/PLATELET
Abs Immature Granulocytes: 0.07 10*3/uL (ref 0.00–0.07)
Basophils Absolute: 0.1 10*3/uL (ref 0.0–0.1)
Basophils Relative: 1 %
Eosinophils Absolute: 0.5 10*3/uL (ref 0.0–0.5)
Eosinophils Relative: 3 %
HCT: 41.6 % (ref 36.0–46.0)
Hemoglobin: 14.5 g/dL (ref 12.0–15.0)
Immature Granulocytes: 1 %
Lymphocytes Relative: 10 %
Lymphs Abs: 1.4 10*3/uL (ref 0.7–4.0)
MCH: 32.7 pg (ref 26.0–34.0)
MCHC: 34.9 g/dL (ref 30.0–36.0)
MCV: 93.9 fL (ref 80.0–100.0)
Monocytes Absolute: 1.2 10*3/uL — ABNORMAL HIGH (ref 0.1–1.0)
Monocytes Relative: 8 %
Neutro Abs: 11.3 10*3/uL — ABNORMAL HIGH (ref 1.7–7.7)
Neutrophils Relative %: 77 %
Platelets: 243 10*3/uL (ref 150–400)
RBC: 4.43 MIL/uL (ref 3.87–5.11)
RDW: 12.2 % (ref 11.5–15.5)
WBC: 14.6 10*3/uL — ABNORMAL HIGH (ref 4.0–10.5)
nRBC: 0 % (ref 0.0–0.2)

## 2019-11-27 LAB — BASIC METABOLIC PANEL
Anion gap: 9 (ref 5–15)
BUN: 21 mg/dL (ref 8–23)
CO2: 27 mmol/L (ref 22–32)
Calcium: 8.8 mg/dL — ABNORMAL LOW (ref 8.9–10.3)
Chloride: 102 mmol/L (ref 98–111)
Creatinine, Ser: 0.78 mg/dL (ref 0.44–1.00)
GFR, Estimated: >60 mL/min (ref >60)
Glucose, Bld: 134 mg/dL — ABNORMAL HIGH (ref 70–99)
Potassium: 4.3 mmol/L (ref 3.5–5.1)
Sodium: 138 mmol/L (ref 135–145)

## 2019-11-27 LAB — RESPIRATORY PANEL BY RT PCR (FLU A&B, COVID)
Influenza A by PCR: NEGATIVE
Influenza B by PCR: NEGATIVE
SARS Coronavirus 2 by RT PCR: NEGATIVE

## 2019-11-27 LAB — URINALYSIS, ROUTINE W REFLEX MICROSCOPIC
Bilirubin Urine: NEGATIVE
Glucose, UA: NEGATIVE mg/dL
Hgb urine dipstick: NEGATIVE
Ketones, ur: 15 mg/dL — AB
Nitrite: NEGATIVE
Protein, ur: NEGATIVE mg/dL
Specific Gravity, Urine: >1.03 — ABNORMAL HIGH (ref 1.005–1.030)
pH: 6 (ref 5.0–8.0)

## 2019-11-27 LAB — MAGNESIUM: Magnesium: 2.1 mg/dL (ref 1.7–2.4)

## 2019-11-27 LAB — LIPASE, BLOOD: Lipase: 27 U/L (ref 11–51)

## 2019-11-27 LAB — HIV ANTIBODY (ROUTINE TESTING W REFLEX): HIV Screen 4th Generation wRfx: NONREACTIVE

## 2019-11-27 MED ORDER — MORPHINE SULFATE (PF) 2 MG/ML IV SOLN: 2.0000 mg | INTRAVENOUS | Status: DC | PRN

## 2019-11-27 MED ORDER — SODIUM CHLORIDE 0.9 % IV SOLN: INTRAVENOUS | Status: DC

## 2019-11-27 MED ORDER — ONDANSETRON HCL 4 MG/2ML IJ SOLN
4.0000 mg | Freq: Once | INTRAMUSCULAR | Status: AC
  Administered 2019-11-27: 4 mg via INTRAVENOUS
  Filled 2019-11-27: qty 2

## 2019-11-27 MED ORDER — ALUM & MAG HYDROXIDE-SIMETH 200-200-20 MG/5ML PO SUSP
15.0000 mL | ORAL | Status: DC | PRN
  Administered 2019-11-27: 15 mL via ORAL
  Filled 2019-11-27: qty 30

## 2019-11-27 MED ORDER — HYDRALAZINE HCL 20 MG/ML IJ SOLN: 10.0000 mg | INTRAMUSCULAR | Status: DC | PRN

## 2019-11-27 MED ORDER — LABETALOL HCL 5 MG/ML IV SOLN: 10.0000 mg | INTRAVENOUS | Status: DC | PRN

## 2019-11-27 MED ORDER — SODIUM CHLORIDE 0.9% FLUSH
3.0000 mL | Freq: Two times a day (BID) | INTRAVENOUS | Status: DC
  Administered 2019-11-28 – 2019-11-30 (×4): 3 mL via INTRAVENOUS

## 2019-11-27 MED ORDER — POTASSIUM CHLORIDE IN NACL 20-0.9 MEQ/L-% IV SOLN
INTRAVENOUS | Status: DC
  Filled 2019-11-27 (×3): qty 1000

## 2019-11-27 MED ORDER — DIPHENHYDRAMINE HCL 50 MG/ML IJ SOLN
25.0000 mg | Freq: Once | INTRAMUSCULAR | Status: AC | PRN
  Administered 2019-11-27: 25 mg via INTRAVENOUS
  Filled 2019-11-27: qty 1

## 2019-11-27 MED ORDER — MORPHINE SULFATE (PF) 4 MG/ML IV SOLN
4.0000 mg | Freq: Once | INTRAVENOUS | Status: AC
  Administered 2019-11-27: 4 mg via INTRAVENOUS
  Filled 2019-11-27: qty 1

## 2019-11-27 MED ORDER — INFLUENZA VAC A&B SA ADJ QUAD 0.5 ML IM PRSY
0.5000 mL | PREFILLED_SYRINGE | INTRAMUSCULAR | Status: AC
  Administered 2019-11-29: 0.5 mL via INTRAMUSCULAR
  Filled 2019-11-27: qty 0.5

## 2019-11-27 MED ORDER — ONDANSETRON HCL 4 MG PO TABS
4.0000 mg | ORAL_TABLET | Freq: Four times a day (QID) | ORAL | Status: DC | PRN
  Filled 2019-11-27: qty 1

## 2019-11-27 MED ORDER — SODIUM CHLORIDE 0.9 % IV SOLN: Freq: Once | INTRAVENOUS | Status: AC

## 2019-11-27 MED ORDER — FENTANYL CITRATE (PF) 100 MCG/2ML IJ SOLN
50.0000 ug | Freq: Once | INTRAMUSCULAR | Status: AC
  Administered 2019-11-27: 50 ug via INTRAVENOUS
  Filled 2019-11-27: qty 2

## 2019-11-27 MED ORDER — IOHEXOL 300 MG/ML  SOLN
100.0000 mL | Freq: Once | INTRAMUSCULAR | Status: AC | PRN
  Administered 2019-11-27: 100 mL via INTRAVENOUS

## 2019-11-27 MED ORDER — ONDANSETRON HCL 4 MG/2ML IJ SOLN: 4.0000 mg | Freq: Four times a day (QID) | INTRAMUSCULAR | Status: DC | PRN

## 2019-11-27 MED ORDER — ENOXAPARIN SODIUM 40 MG/0.4ML ~~LOC~~ SOLN
40.0000 mg | Freq: Every day | SUBCUTANEOUS | Status: DC
  Administered 2019-11-27 – 2019-11-30 (×4): 40 mg via SUBCUTANEOUS
  Filled 2019-11-27 (×4): qty 0.4

## 2019-11-27 NOTE — Assessment & Plan Note (Signed)
-  Well-controlled on Toprol at home.  Follows with cardiology and was actually last evaluated on 11/23/2019 -Resume Toprol when able

## 2019-11-27 NOTE — ED Provider Notes (Signed)
[facility] DEPT [facility] Provider Note: [name], MD, FACEP  CSN: [id number] MRN: [id number] ARRIVAL: 11/27/19 at [place]: [place]  Abdominal Pain   HISTORY OF PRESENT ILLNESS  11/27/19 1:49 AM Cynthia Peters is a 69 y.o. female with a history of small bowel obstruction and abdominal aortic aneurysm.  She is here with diffuse, but most prominent in the epigastrium, abdominal pain for the past 6 hours.  She describes the pain as a burning and a pressure and rates it as an 8 out of 10.  It is somewhat worse with movement and palpation.  Her abdomen felt firm and distended but she states her abdomen always feels distended because of poor muscle tone.  She denies any associated nausea, vomiting, diarrhea or constipation.   Past Medical History:  Diagnosis Date  . Anemia    history in the past, not on a regular basis  . Arthritis   . Bronchitis   . Cataracts, bilateral    immature  . Claustrophobia    takes Valium daily as needed  . Complication of anesthesia    2013 SURGERY vocal cord bothering pt, used smaller ent tube after 2 hernia repair, no problem after . " I woke up during my MRI and colonoscopy."       . Depression    takes Wellbutrin daily  . Dry eye   . Dry mouth   . GERD (gastroesophageal reflux disease)    takes Omeprazole daily   . H/O hiatal hernia   . Headache    migraines with flashing lights   . History of blood clots 40 yrs    leg   . History of echocardiogram 2013   a. Echo (05/2011): Mild LVH, EF 65-70%, no WMA, Gr 1 DD, mild AI.  Marland Kitchen History of MRSA infection   . History of staph infection   . History of stress test 2010   a. ETT-Echo in 2010 was normal  . History of TIA (transient ischemic attack) 2013   a. Carotid US (05/2011): No ICA stenosis, pt unsure of this  . Hyperlipidemia    takes Simvastatin daily   . Hypertension    takes  Losartan daily  . IBS (irritable bowel syndrome)    takes Bentyl daily as needed   . Insomnia    takes Ambien nightly  . Interstitial cystitis   . Lichen planus   . Mild asthma    Albuterol inhaler as needed  . Palpitations    Event monitor in 2007 with rare PVCs // takes Metoprolol daily  . PVC (premature ventricular contraction)   . Seasonal allergies    takes Claritin daily as needed.Takes Singulair daily as needed.  Marland Kitchen TIA (transient ischemic attack) 05/27/2011  . Urethral stricture   . Vertigo    takes Meclizine daily as needed    Past Surgical History:  Procedure Laterality Date  . CESAREAN SECTION    . COLONOSCOPY WITH PROPOFOL N/A 08/10/2012   Procedure: COLONOSCOPY WITH PROPOFOL;  Surgeon: Garlan Fair, MD;  Location: [facility];  Service: Endoscopy;  Laterality: N/A;  . CYSTOSCOPY WITH URETHRAL DILATATION N/A 02/18/2013   Procedure: CYSTOSCOPY WITH URETHRAL DILATATION ( NO BALLOON) AND HYDRODISTENSION;  Surgeon: Alexis Frock, MD;  Location: [facility];  Service: Urology;  Laterality: N/A;  . ENDOMETRIAL ABLATION W/ HYDROTHERMABLATOR  09-27-2002  . INCISIONAL HERNIA REPAIR N/A 10/25/2014   Procedure: HERNIA REPAIR INCISIONAL;  Surgeon: Ralene Ok, MD;  Location: [facility]  OR;  Service: General;  Laterality: N/A;  . INCISIONAL HERNIA REPAIR N/A 03/26/2015   Procedure: LAPAROSCOPIC INCISIONAL HERNIA;  Surgeon: Ralene Ok, MD;  Location: [place];  Service: General;  Laterality: N/A;  . INSERTION OF MESH N/A 10/25/2014   Procedure: INSERTION OF MESH;  Surgeon: Ralene Ok, MD;  Location: [facility];  Service: General;  Laterality: N/A;  . LAPAROSCOPIC LYSIS OF ADHESIONS N/A 03/26/2015   Procedure: LAPAROSCOPIC LYSIS OF ADHESIONS;  Surgeon: Ralene Ok, MD;  Location: [facility];  Service: General;  Laterality: N/A;  . LAPAROTOMY N/A 10/25/2014   Procedure: EXPLORATORY LAPAROTOMY;  Surgeon: Ralene Ok, MD;  Location: [place];  Service: General;  Laterality: N/A;  . LYSIS OF ADHESION N/A 10/25/2014   Procedure: LYSIS OF ADHESIONS ;  Surgeon:  Ralene Ok, MD;  Location: [place];  Service: General;  Laterality: N/A;  . MUCOSAL ADVANCEMENT FLAP N/A 10/25/2014   Procedure: MUCOSAL ADVANCEMENT FLAP;  Surgeon: Ralene Ok, MD;  Location: [place];  Service: General;  Laterality: N/A;  . ORIF RADIAL FRACTURE Right 06/17/2016   Procedure: right radial head arthroplasty with ligament repair, reconstruciton;  Surgeon: Roseanne Kaufman, MD;  Location: [facility];  Service: Orthopedics;  Laterality: Right;  . RADIAL HEAD ARTHROPLASTY Right 06/17/2016  . TOOTH EXTRACTION    . TRANSTHORACIC ECHOCARDIOGRAM  05-26-2011   MILD LVH/  EF 26-94%/  GRADE I DIASTOLIC DYSFUNCTION/  MILD AR//   06-20-2008  NOMRAL STRESS ECHO  . UMBILICAL HERNIA REPAIR  2013   AND REVISION THE SAME YEAR W/ MESH  . WISDOM TOOTH EXTRACTION  AGE 89    Family History  Problem Relation Age of Onset  . Dementia Mother   . Leukemia Child   . Cirrhosis Father     Social History   Tobacco Use  . Smoking status: Former Smoker    Packs/day: 0.25    Years: 2.00    Pack years: 0.50    Types: Cigarettes    Quit date: 02/04/1976    Years since quitting: 43.8  . Smokeless tobacco: Never Used  Vaping Use  . Vaping Use: Never used  Substance Use Topics  . Alcohol use: Yes    Comment: occasional  . Drug use: No    Prior to Admission medications   Medication Sig Start Date End Date Taking? Authorizing Provider  acetaminophen (TYLENOL) 500 MG tablet Take 1,000 mg by mouth every 6 (six) hours as needed (pain).    Yes [provider]  amLODipine (NORVASC) 2.5 MG tablet Take 1 tablet (2.5 mg total) by mouth 2 (two) times daily. 11/23/19  Yes Kroeger, Daleen Snook M., PA-C  aspirin 81 MG chewable tablet Chew 81 mg by mouth daily.   Yes [provider]  buPROPion (WELLBUTRIN XL) 300 MG 24 hr tablet Take 300 mg by mouth daily.    Yes [provider]  Cholecalciferol (VITAMIN D) 2000 units CAPS Take 2,000 Units by mouth daily.   Yes [provider]   Coenzyme Q10 (CO Q 10) 100 MG CAPS Take 100 mg by mouth daily.    Yes [provider]  CVS VITAMIN C 1000 MG tablet Take 2,000 mg by mouth 2 (two) times daily.  06/13/16  Yes [provider]  Evening Primrose Oil 1000 MG CAPS Take 1,000 mg by mouth daily.    Yes [provider]  Fexofenadine HCl (ALLEGRA PO) Take 1 tablet by mouth daily as needed (ALLERGIES).   Yes [provider]  Flaxseed, Linseed, 1000  MG CAPS Take 1,000 mg by mouth 2 (two) times daily.    Yes [provider]  fluticasone (FLONASE) 50 MCG/ACT nasal spray Place 2 sprays into both nostrils daily as needed for allergies or rhinitis.    Yes [provider]  hydrochlorothiazide 25 MG tablet Take 25 mg by mouth daily.    Yes [provider]  losartan (COZAAR) 100 MG tablet TAKE 1 TABLET BY MOUTH EVERY DAY Patient taking differently: Take 100 mg by mouth daily.  09/14/17  Yes Martinique, Peter M, MD  metoprolol succinate (TOPROL-XL) 100 MG 24 hr tablet Take 1 tablet (100 mg total) by mouth 2 (two) times daily. Take with or immediately following a meal. Take 100 mg by mouth 2 (two) times daily. Take with or immediately following a meal. 07/14/18  Yes Martinique, Peter M, MD  montelukast (SINGULAIR) 10 MG tablet Take 10 mg by mouth daily.    Yes [provider]  Multiple Vitamin (MULTIVITAMIN WITH MINERALS) TABS Take 1 tablet by mouth daily.   Yes [provider]  albuterol (PROVENTIL HFA;VENTOLIN HFA) 108 (90 BASE) MCG/ACT inhaler Inhale 2 puffs into the lungs every 6 (six) hours as needed for wheezing or shortness of breath.    [provider]  clobetasol cream (TEMOVATE) 6.25 % Apply 1 application topically 2 (two) times daily as needed (rash).  04/01/16   [provider]  dicyclomine (BENTYL) 10 MG capsule Take 10 mg by mouth every 4 (four) hours as needed (for abdominal cramps).  02/13/15   [provider]  HYDROCORTISONE ACE, RECTAL, 30 MG  SUPP Place 30 mg rectally 2 (two) times daily as needed for hemorrhoids. 04/07/16   [provider]  ibuprofen (ADVIL,MOTRIN) 200 MG tablet Take 400-600 mg by mouth 2 (two) times daily as needed (pain).     [provider]  lidocaine (XYLOCAINE) 5 % ointment Apply 1 application topically 3 (three) times daily as needed for mild pain or moderate pain. Patient not taking: Reported on 11/23/2019    [provider]  meclizine (ANTIVERT) 25 MG tablet Take 1 tablet (25 mg total) by mouth 3 (three) times daily as needed for dizziness. 04/14/13   [place] Delauder, MD  nystatin (MYCOSTATIN) 100000 UNIT/ML suspension Take 5 mLs by mouth daily as needed (FLARES, LICHEN PLANTIS).    [provider]  nystatin cream (MYCOSTATIN) Apply 1 application topically 2 (two) times daily as needed for dry skin.    [provider]  omeprazole (PRILOSEC) 20 MG capsule Take 20 mg by mouth daily.    [provider]  omeprazole (PRILOSEC) 20 MG capsule Take 1 capsule (20 mg total) by mouth daily. 11/18/18   Horton, Barbette Hair, MD  ondansetron (ZOFRAN ODT) 4 MG disintegrating tablet Take 1 tablet (4 mg total) by mouth every 8 (eight) hours as needed for nausea or vomiting. 11/18/18   Horton, Barbette Hair, MD  pantoprazole (PROTONIX) 20 MG tablet Take 20 mg by mouth daily. 01/07/19   [provider]  phenazopyridine (PYRIDIUM) 200 MG tablet Take 200 mg by mouth 3 (three) times daily as needed for pain [place] TRACT PAIN).    [provider]  potassium chloride SA (KLOR-CON M20) 20 MEQ tablet TAKE 1&1/2 TABLETS BY MOUTH DAILY. PLEASE SCHEDULE ANNUAL APPT WITH DR. Martinique FOR REFILLS 11/23/19   Kroeger, Daleen Snook M., PA-C  PREVIDENT 5000 DRY MOUTH 1.1 % GEL dental gel Place 1 application onto teeth at bedtime.  09/01/14   [provider]  Probiotic Product (ALIGN PO) Take 1 capsule by mouth 2 (two) times daily.     [provider]  simvastatin (ZOCOR) 40 MG  tablet Take 40 mg by mouth daily.     [provider]  valACYclovir (VALTREX) 500 MG tablet INCREASE TO TAKING 1 TABLET 2 TIMES A DAY FOR 3 DAYS WITH OUTBREAK Patient taking differently: Take 500 mg by mouth daily.  03/25/16   Kem Boroughs, FNP  zolpidem (AMBIEN) 10 MG tablet Take 10 mg by mouth at bedtime as needed for sleep. 07/15/17   [provider]    Allergies Hydrocodone, Hydrocodone-acetaminophen, and Tramadol   REVIEW OF SYSTEMS  Negative except as noted here or in the History of Present Illness.   PHYSICAL EXAMINATION  Initial Vital Signs Blood pressure (!) 156/75, pulse 68, temperature 98.1 F (36.7 C), temperature source Oral, resp. rate 18, height 5\' 4"  (1.626 m), weight 79.5 kg, SpO2 100 %.  Examination General: Well-developed, well-nourished female in no acute distress; appearance consistent with age of record HENT: normocephalic; atraumatic Eyes: pupils equal, round and reactive to light; extraocular muscles intact Neck: supple Heart: regular rate and rhythm Lungs: clear to auscultation bilaterally Abdomen: soft; mild distention; mild epigastric tenderness; no masses or hepatosplenomegaly; bowel sounds present Extremities: No deformity; full range of motion; pulses normal Neurologic: Awake, alert and oriented; motor function intact in all extremities and symmetric; no facial droop Skin: Warm and dry Psychiatric: Normal mood and affect   RESULTS  Summary of this visit's results, reviewed and interpreted by myself:   EKG Interpretation  Date/Time:  Sunday November 27 2019 01:40:34 EDT Ventricular Rate:  67 PR Interval:    QRS Duration: 97 QT Interval:  390 QTC Calculation: 412 R Axis:   -57 Text Interpretation: Sinus rhythm Ventricular premature complex Left anterior fascicular block Abnormal R-wave progression, late transition Confirmed by Shanon Rosser [phone]) on 11/27/2019 1:48:56 AM      Laboratory Studies: Results for orders placed  or performed during the hospital encounter of 11/27/19 (from the past 24 hour(s))  CBC with Differential/Platelet     Status: Abnormal   Collection Time: 11/27/19  1:49 AM  Result Value Ref Range   WBC 14.6 (H) 4.0 - 10.5 K/uL   RBC 4.43 3.87 - 5.11 MIL/uL   Hemoglobin 14.5 12.0 - 15.0 g/dL   HCT 41.6 36 - 46 %   MCV 93.9 80.0 - 100.0 fL   MCH 32.7 26.0 - 34.0 pg   MCHC 34.9 30.0 - 36.0 g/dL   RDW 12.2 11.5 - 15.5 %   Platelets 243 150 - 400 K/uL   nRBC 0.0 0.0 - 0.2 %   Neutrophils Relative % 77 %   Neutro Abs 11.3 (H) 1.7 - 7.7 K/uL   Lymphocytes Relative 10 %   Lymphs Abs 1.4 0.7 - 4.0 K/uL   Monocytes Relative 8 %   Monocytes Absolute 1.2 (H) 0.1 - 1.0 K/uL   Eosinophils Relative 3 %   Eosinophils Absolute 0.5 0.0 - 0.5 K/uL   Basophils Relative 1 %   Basophils Absolute 0.1 0.0 - 0.1 K/uL   Immature Granulocytes 1 %   Abs Immature Granulocytes 0.07 0.00 - 0.07 K/uL  Comprehensive metabolic panel     Status: Abnormal   Collection Time: 11/27/19  1:49 AM  Result Value Ref Range   Sodium 138 135 - 145 mmol/L   Potassium 3.8 3.5 - 5.1 mmol/L   Chloride 100 98 - 111 mmol/L   CO2  27 22 - 32 mmol/L   Glucose, Bld 140 (H) 70 - 99 mg/dL   BUN 24 (H) 8 - 23 mg/dL   Creatinine, Ser 0.82 0.44 - 1.00 mg/dL   Calcium 9.0 8.9 - 10.3 mg/dL   Total Protein 6.8 6.5 - 8.1 g/dL   Albumin 4.2 3.5 - 5.0 g/dL   AST 27 15 - 41 U/L   ALT 24 0 - 44 U/L   Alkaline Phosphatase 59 38 - 126 U/L   Total Bilirubin 1.3 (H) 0.3 - 1.2 mg/dL   GFR, Estimated >60 >60 mL/min   Anion gap 11 5 - 15  Lipase, blood     Status: None   Collection Time: 11/27/19  1:49 AM  Result Value Ref Range   Lipase 27 11 - 51 U/L  Urinalysis, Routine w reflex microscopic Urine, Clean Catch     Status: Abnormal   Collection Time: 11/27/19  2:04 AM  Result Value Ref Range   Color, Urine YELLOW YELLOW   APPearance HAZY (A) CLEAR   Specific Gravity, Urine >1.030 (H) 1.005 - 1.030   pH 6.0 5.0 - 8.0   Glucose, UA  NEGATIVE NEGATIVE mg/dL   Hgb urine dipstick NEGATIVE NEGATIVE   Bilirubin Urine NEGATIVE NEGATIVE   Ketones, ur 15 (A) NEGATIVE mg/dL   Protein, ur NEGATIVE NEGATIVE mg/dL   Nitrite NEGATIVE NEGATIVE   Leukocytes,Ua SMALL (A) NEGATIVE  Urinalysis, Microscopic (reflex)     Status: Abnormal   Collection Time: 11/27/19  2:04 AM  Result Value Ref Range   RBC / HPF 0-5 0 - 5 RBC/hpf   WBC, UA 11-20 0 - 5 WBC/hpf   Bacteria, UA FEW (A) NONE SEEN   Squamous Epithelial / LPF 0-5 0 - 5   Ca Oxalate Crys, UA PRESENT    Imaging Studies: CT ABDOMEN PELVIS W CONTRAST  Result Date: 11/27/2019 CLINICAL DATA:  Diffuse abdominal pain EXAM: CT ABDOMEN AND PELVIS WITH CONTRAST TECHNIQUE: Multidetector CT imaging of the abdomen and pelvis was performed using the standard protocol following bolus administration of intravenous contrast. CONTRAST:  110mL OMNIPAQUE IOHEXOL 300 MG/ML  SOLN COMPARISON:  April 01, 2019 FINDINGS: Lower chest: The visualized heart size within normal limits. No pericardial fluid/thickening. No hiatal hernia. The visualized portions of the lungs are clear. Hepatobiliary: Multiple low-density lesions are seen throughout the liver parenchyma the largest within the superior right hepatic dome measuring 2 cm. These are likely hepatic cysts. No evidence of calcified gallstones, gallbladder wall thickening or biliary dilatation. Pancreas: Unremarkable. No pancreatic ductal dilatation or surrounding inflammatory changes. Spleen: Normal in size without focal abnormality. Adrenals/Urinary Tract: Both adrenal glands appear normal. The kidneys and collecting system appear normal without evidence of urinary tract calculus or hydronephrosis. Bladder is unremarkable. Stomach/Bowel: The stomach is normal in appearance. There is mildly dilated air and fluid-filled loops of small bowel with a gradual area of narrowing seen within the [place] Vascular/Lymphatic: There are no enlarged mesenteric,  retroperitoneal, or pelvic lymph nodes. No significant vascular findings are present. Reproductive: Other: No evidence of abdominal wall mass or hernia. Musculoskeletal: No acute or significant osseous findings. There is a grade 1 anterolisthesis of L4 on L5 measuring 4 mm. IMPRESSION: Findings suggestive of ileus versus partial small obstruction within the proximal to mid ileal loops. There is fecalization of the terminal ileum which could be due to true slow transit. Electronically Signed   By: Prudencio Pair M.D.   On: 11/27/2019 03:20    ED COURSE  and MDM  Nursing notes, initial and subsequent vitals signs, including pulse oximetry, reviewed and interpreted by myself.  Vitals:   11/27/19 0144 11/27/19 0145 11/27/19 0224  BP: (!) 156/75    Pulse: 68    Resp: 18    Temp: 98.1 F (36.7 C)    TempSrc: Oral    SpO2: 100%  100%  Weight:  79.5 kg   Height:  5\' 4"  (1.626 m)    Medications  ondansetron (ZOFRAN) injection 4 mg (4 mg Intravenous Given 11/27/19 0210)  fentaNYL (SUBLIMAZE) injection 50 mcg (50 mcg Intravenous Given 11/27/19 0215)  iohexol (OMNIPAQUE) 300 MG/ML solution 100 mL (100 mLs Intravenous Contrast Given 11/27/19 0300)  fentaNYL (SUBLIMAZE) injection 50 mcg (50 mcg Intravenous Given 11/27/19 0323)   CT suspicious for small bowel obstruction and patient symptoms are consistent with previous small bowel obstructions.  We will have her admitted.   3:57 AM Dr. Myna Hidalgo to admit to hospitalist service.  Dr. Harlow Asa to consult for general surgery.  PROCEDURES  Procedures   ED DIAGNOSES     ICD-10-CM   1. SBO (small bowel obstruction) ([place])  K56.609        Zharia Conrow, Jenny Reichmann, MD 11/27/19 [phone]

## 2019-11-27 NOTE — Hospital Course (Addendum)
Cynthia Peters is a 69 yo female with PMH SBOs, multiple abdominal surgeries, hiatal hernia, arthritis, depression, DVT, TIA, HTN, HLD, IBS, thoracic aortic aneurysm, palpitations who presented to [facility] with worsening abdominal pain. She states she typically has some chronic abdominal pain at baseline from her history of abdominal surgeries and hernia repairs but that the pain had recently worsened earlier on Saturday.  She states that she had 2-3 bowel movements Saturday morning and has had some flatus just prior to going to [facility], but mostly her pain was her biggest complaint.  She states her last SBO was approximately 2019 and her last abdominal surgery was approximately 2018.  She did not require surgical intervention for her last SBO.  She has no nausea nor vomiting on assessment.  She underwent CT abdomen/pelvis which revealed ileus versus partial SBO within the proximal to mid ileal loops.  Of note, she does have IBS with predominant constipation and takes MiraLAX daily.  She was transferred to [facility] for further monitoring and surgical evaluation.

## 2019-11-27 NOTE — Plan of Care (Signed)
  Problem: Education: Goal: Knowledge of General Education information will improve Description Including pain rating scale, medication(s)/side effects and non-pharmacologic comfort measures Outcome: Progressing   Problem: Clinical Measurements: Goal: Will remain free from infection Outcome: Progressing Goal: Respiratory complications will improve Outcome: Progressing Goal: Cardiovascular complication will be avoided Outcome: Progressing   Problem: Activity: Goal: Risk for activity intolerance will decrease Outcome: Progressing   Problem: Coping: Goal: Level of anxiety will decrease Outcome: Progressing   Problem: Elimination: Goal: Will not experience complications related to urinary retention Outcome: Progressing   Problem: Pain Managment: Goal: General experience of comfort will improve Outcome: Progressing   Problem: Safety: Goal: Ability to remain free from injury will improve Outcome: Progressing   Problem: Skin Integrity: Goal: Risk for impaired skin integrity will decrease Outcome: Progressing   

## 2019-11-27 NOTE — Assessment & Plan Note (Signed)
-  On amlodipine, losartan, Toprol.  Currently on hold while n.p.o. -Use labetalol or hydralazine as needed

## 2019-11-27 NOTE — ED Triage Notes (Signed)
Reports diffuse abdominal pain for the last six hours.  Hx of bowel obstruction X 2 and 4 hernia repairs.  Denies and n/v/d.  Does have hx of aortic aneurysm.

## 2019-11-27 NOTE — Assessment & Plan Note (Signed)
-  Patient endorses predominant constipation.  She uses MiraLAX daily at home.  This may be contributing to her presenting symptoms and findings on CT -Follow-up surgery consult regarding above, but may benefit from more aggressive bowel regimen at home at discharge

## 2019-11-27 NOTE — H&P (Signed)
History and Physical    Cynthia Peters  YPP:[id number]  DOB: 26-Apr-1950  DOA: 11/27/2019  PCP: Aletha Halim., PA-C Patient coming from: home  Chief Complaint: abdominal pain  HPI:  Cynthia Peters is a 69 yo female with PMH SBOs, multiple abdominal surgeries, hiatal hernia, arthritis, depression, DVT, TIA, HTN, HLD, IBS, thoracic aortic aneurysm, palpitations who presented to [facility] with worsening abdominal pain. She states she typically has some chronic abdominal pain at baseline from her history of abdominal surgeries and hernia repairs but that the pain had recently worsened earlier on Saturday.  She states that she had 2-3 bowel movements Saturday morning and has had some flatus just prior to going to [facility], but mostly her pain was her biggest complaint.  She states her last SBO was approximately 2019 and her last abdominal surgery was approximately 2018.  She did not require surgical intervention for her last SBO.  She has no nausea nor vomiting on assessment.  She underwent CT abdomen/pelvis which revealed ileus versus partial SBO within the proximal to mid ileal loops.  Of note, she does have IBS with predominant constipation and takes MiraLAX daily.  She was transferred to [facility] for further monitoring and surgical evaluation.   I have personally briefly reviewed patient's old medical records in [facility] and discussed patient with the ER provider when appropriate/indicated.  Assessment/Plan: * Partial small bowel obstruction (HCC) -Worsening abdominal pain.  No nausea nor vomiting.  CT shows possible ileus versus partial SBO.  History of multiple abdominal surgeries places her at increased risk -Continue conservative measures for now with close monitoring -Follow-up surgery consult -No need for NG tube at this time unless develops nausea or vomiting -Continue fluids -keep NPO; sips and ice chips okay -Last BM approximately Saturday  afternoon  Asymptomatic bacteriuria -UA noted with small leukocyte esterase and few bacteria, 11-20 WBC -She is asymptomatic, therefore will hold off on any treatment at this time; leukocytosis noted however may be reactive also in setting of underlying pSBO/ileus  Irritable bowel syndrome -Patient endorses predominant constipation.  She uses MiraLAX daily at home.  This may be contributing to her presenting symptoms and findings on CT -Follow-up surgery consult regarding above, but may benefit from more aggressive bowel regimen at home at discharge  Thoracic aortic aneurysm [facility]) -Last cardiology note reviewed from 11/23/2019.  Aneurysm is 40 mm on last echo in 2018 -She is followed closely by cardiology outpatient  History of TIA (transient ischemic attack) -Hold home aspirin for now in case of need for surgery  Anxiety -Hold Wellbutrin until taking orals again  Hyperlipidemia -Hold statin for now  Hypertension -On amlodipine, losartan, Toprol.  Currently on hold while n.p.o. -Use labetalol or hydralazine as needed  Palpitations -Well-controlled on Toprol at home.  Follows with cardiology and was actually last evaluated on 11/23/2019 -Resume Toprol when able    Code Status: Full DVT Prophylaxis: Lovenox Anticipated disposition is to: Home  History: Past Medical History:  Diagnosis Date  . Anemia    history in the past, not on a regular basis  . Arthritis   . Bronchitis   . Cataracts, bilateral    immature  . Claustrophobia    takes Valium daily as needed  . Complication of anesthesia    2013 SURGERY vocal cord bothering pt, used smaller ent tube after 2 hernia repair, no problem after . " I woke up during my MRI and colonoscopy."       .  Depression    takes Wellbutrin daily  . Dry eye   . Dry mouth   . GERD (gastroesophageal reflux disease)    takes Omeprazole daily   . H/O hiatal hernia   . Headache    migraines with flashing lights   . History of blood  clots 40 yrs    leg   . History of echocardiogram 2013   a. Echo (05/2011): Mild LVH, EF 65-70%, no WMA, Gr 1 DD, mild AI.  Marland Kitchen History of MRSA infection   . History of staph infection   . History of stress test 2010   a. ETT-Echo in 2010 was normal  . History of TIA (transient ischemic attack) 2013   a. Carotid US (05/2011): No ICA stenosis, pt unsure of this  . Hyperlipidemia    takes Simvastatin daily   . Hypertension    takes  Losartan daily  . IBS (irritable bowel syndrome)    takes Bentyl daily as needed  . Insomnia    takes Ambien nightly  . Interstitial cystitis   . Lichen planus   . Mild asthma    Albuterol inhaler as needed  . Palpitations    Event monitor in 2007 with rare PVCs // takes Metoprolol daily  . PVC (premature ventricular contraction)   . Seasonal allergies    takes Claritin daily as needed.Takes Singulair daily as needed.  Marland Kitchen TIA (transient ischemic attack) 05/27/2011  . Urethral stricture   . Vertigo    takes Meclizine daily as needed    Past Surgical History:  Procedure Laterality Date  . CESAREAN SECTION    . COLONOSCOPY WITH PROPOFOL N/A 08/10/2012   Procedure: COLONOSCOPY WITH PROPOFOL;  Surgeon: Garlan Fair, MD;  Location: [facility];  Service: Endoscopy;  Laterality: N/A;  . CYSTOSCOPY WITH URETHRAL DILATATION N/A 02/18/2013   Procedure: CYSTOSCOPY WITH URETHRAL DILATATION ( NO BALLOON) AND HYDRODISTENSION;  Surgeon: Alexis Frock, MD;  Location: [facility];  Service: Urology;  Laterality: N/A;  . ENDOMETRIAL ABLATION W/ HYDROTHERMABLATOR  09-27-2002  . INCISIONAL HERNIA REPAIR N/A 10/25/2014   Procedure: HERNIA REPAIR INCISIONAL;  Surgeon: Ralene Ok, MD;  Location: [place];  Service: General;  Laterality: N/A;  . INCISIONAL HERNIA REPAIR N/A 03/26/2015   Procedure: LAPAROSCOPIC INCISIONAL HERNIA;  Surgeon: Ralene Ok, MD;  Location: [place];  Service: General;  Laterality: N/A;  . INSERTION OF MESH N/A 10/25/2014    Procedure: INSERTION OF MESH;  Surgeon: Ralene Ok, MD;  Location: [place];  Service: General;  Laterality: N/A;  . LAPAROSCOPIC LYSIS OF ADHESIONS N/A 03/26/2015   Procedure: LAPAROSCOPIC LYSIS OF ADHESIONS;  Surgeon: Ralene Ok, MD;  Location: [place];  Service: General;  Laterality: N/A;  . LAPAROTOMY N/A 10/25/2014   Procedure: EXPLORATORY LAPAROTOMY;  Surgeon: Ralene Ok, MD;  Location: [place];  Service: General;  Laterality: N/A;  . LYSIS OF ADHESION N/A 10/25/2014   Procedure: LYSIS OF ADHESIONS ;  Surgeon: Ralene Ok, MD;  Location: [place];  Service: General;  Laterality: N/A;  . MUCOSAL ADVANCEMENT FLAP N/A 10/25/2014   Procedure: MUCOSAL ADVANCEMENT FLAP;  Surgeon: Ralene Ok, MD;  Location: [place];  Service: General;  Laterality: N/A;  . ORIF RADIAL FRACTURE Right 06/17/2016   Procedure: right radial head arthroplasty with ligament repair, reconstruciton;  Surgeon: Roseanne Kaufman, MD;  Location: [place];  Service: Orthopedics;  Laterality: Right;  . RADIAL HEAD ARTHROPLASTY Right 06/17/2016  . TOOTH EXTRACTION    . TRANSTHORACIC ECHOCARDIOGRAM  05-26-2011  MILD LVH/  EF 67-89%/  GRADE I DIASTOLIC DYSFUNCTION/  MILD AR//   06-20-2008  NOMRAL STRESS ECHO  . UMBILICAL HERNIA REPAIR  2013   AND REVISION THE SAME YEAR W/ MESH  . WISDOM TOOTH EXTRACTION  AGE 53     reports that she quit smoking about 43 years ago. Her smoking use included cigarettes. She has a 0.50 pack-year smoking history. She has never used smokeless tobacco. She reports current alcohol use. She reports that she does not use drugs.  Allergies  Allergen Reactions  . Hydrocodone Rash  . Hydrocodone-Acetaminophen Rash  . Tramadol Palpitations    Family History  Problem Relation Age of Onset  . Dementia Mother   . Leukemia Child   . Cirrhosis Father    Home Medications: Prior to Admission medications   Medication Sig Start Date End Date Taking? Authorizing Provider  acetaminophen (TYLENOL) 500  MG tablet Take 1,000 mg by mouth every 6 (six) hours as needed (pain).    Yes [provider]  amLODipine (NORVASC) 2.5 MG tablet Take 1 tablet (2.5 mg total) by mouth 2 (two) times daily. 11/23/19  Yes Kroeger, Daleen Snook M., PA-C  aspirin 81 MG chewable tablet Chew 81 mg by mouth daily.   Yes [provider]  buPROPion (WELLBUTRIN XL) 300 MG 24 hr tablet Take 300 mg by mouth daily.    Yes [provider]  Cholecalciferol (VITAMIN D) 2000 units CAPS Take 2,000 Units by mouth daily.   Yes [provider]  Coenzyme Q10 (CO Q 10) 100 MG CAPS Take 100 mg by mouth daily.    Yes [provider]  CVS VITAMIN C 1000 MG tablet Take 2,000 mg by mouth 2 (two) times daily.  06/13/16  Yes [provider]  Evening Primrose Oil 1000 MG CAPS Take 1,000 mg by mouth daily.    Yes [provider]  Fexofenadine HCl (ALLEGRA PO) Take 1 tablet by mouth daily as needed (ALLERGIES).   Yes [provider]  Flaxseed, Linseed, 1000 MG CAPS Take 1,000 mg by mouth 2 (two) times daily.    Yes [provider]  fluticasone (FLONASE) 50 MCG/ACT nasal spray Place 2 sprays into both nostrils daily as needed for allergies or rhinitis.    Yes [provider]  hydrochlorothiazide 25 MG tablet Take 25 mg by mouth daily.    Yes [provider]  losartan (COZAAR) 100 MG tablet TAKE 1 TABLET BY MOUTH EVERY DAY Patient taking differently: Take 100 mg by mouth daily.  09/14/17  Yes Martinique, Peter M, MD  metoprolol succinate (TOPROL-XL) 100 MG 24 hr tablet Take 1 tablet (100 mg total) by mouth 2 (two) times daily. Take with or immediately following a meal. Take 100 mg by mouth 2 (two) times daily. Take with or immediately following a meal. 07/14/18  Yes Martinique, Peter M, MD  montelukast (SINGULAIR) 10 MG tablet Take 10 mg by mouth daily.    Yes [provider]  Multiple Vitamin (MULTIVITAMIN WITH MINERALS) TABS Take 1 tablet by mouth daily.   Yes  [provider]  albuterol (PROVENTIL HFA;VENTOLIN HFA) 108 (90 BASE) MCG/ACT inhaler Inhale 2 puffs into the lungs every 6 (six) hours as needed for wheezing or shortness of breath.    [provider]  clobetasol cream (TEMOVATE) 3.81 % Apply 1 application topically 2 (two) times daily as needed (rash).  04/01/16   [provider]  dicyclomine (BENTYL) 10 MG capsule Take 10 mg by  mouth every 4 (four) hours as needed (for abdominal cramps).  02/13/15   [provider]  HYDROCORTISONE ACE, RECTAL, 30 MG SUPP Place 30 mg rectally 2 (two) times daily as needed for hemorrhoids. 04/07/16   [provider]  ibuprofen (ADVIL,MOTRIN) 200 MG tablet Take 400-600 mg by mouth 2 (two) times daily as needed (pain).     [provider]  lidocaine (XYLOCAINE) 5 % ointment Apply 1 application topically 3 (three) times daily as needed for mild pain or moderate pain. Patient not taking: Reported on 11/23/2019    [provider]  meclizine (ANTIVERT) 25 MG tablet Take 1 tablet (25 mg total) by mouth 3 (three) times daily as needed for dizziness. 04/14/13   Molpus, John, MD  nystatin (MYCOSTATIN) 100000 UNIT/ML suspension Take 5 mLs by mouth daily as needed (FLARES, LICHEN PLANTIS).    [provider]  nystatin cream (MYCOSTATIN) Apply 1 application topically 2 (two) times daily as needed for dry skin.    [provider]  omeprazole (PRILOSEC) 20 MG capsule Take 20 mg by mouth daily.    [provider]  omeprazole (PRILOSEC) 20 MG capsule Take 1 capsule (20 mg total) by mouth daily. 11/18/18   Horton, Barbette Hair, MD  ondansetron (ZOFRAN ODT) 4 MG disintegrating tablet Take 1 tablet (4 mg total) by mouth every 8 (eight) hours as needed for nausea or vomiting. 11/18/18   Horton, Barbette Hair, MD  pantoprazole (PROTONIX) 20 MG tablet Take 20 mg by mouth daily. 01/07/19   [provider]  phenazopyridine (PYRIDIUM) 200 MG tablet Take  200 mg by mouth 3 (three) times daily as needed for pain Stark Jock TRACT PAIN).    [provider]  potassium chloride SA (KLOR-CON M20) 20 MEQ tablet TAKE 1&1/2 TABLETS BY MOUTH DAILY. PLEASE SCHEDULE ANNUAL APPT WITH DR. Martinique FOR REFILLS 11/23/19   Kroeger, Daleen Snook M., PA-C  PREVIDENT 5000 DRY MOUTH 1.1 % GEL dental gel Place 1 application onto teeth at bedtime.  09/01/14   [provider]  Probiotic Product (ALIGN PO) Take 1 capsule by mouth 2 (two) times daily.     [provider]  simvastatin (ZOCOR) 40 MG tablet Take 40 mg by mouth daily.     [provider]  valACYclovir (VALTREX) 500 MG tablet INCREASE TO TAKING 1 TABLET 2 TIMES A DAY FOR 3 DAYS WITH OUTBREAK Patient taking differently: Take 500 mg by mouth daily.  03/25/16   Kem Boroughs, FNP  zolpidem (AMBIEN) 10 MG tablet Take 10 mg by mouth at bedtime as needed for sleep. 07/15/17   [provider]    Review of Systems:  Pertinent items noted in HPI and remainder of comprehensive ROS otherwise negative.  Physical Exam: Vitals:   11/27/19 0354 11/27/19 0440 11/27/19 0455 11/27/19 0543  BP: (!) 148/69  122/80 137/76  Pulse: (!) 56 60 72 63  Resp: 10 13 12 18   Temp:    98.6 F (37 C)  TempSrc:    Oral  SpO2: 100% 91% 92% 100%  Weight:      Height:       General appearance: alert, cooperative and no distress Head: Normocephalic, without obvious abnormality, atraumatic Eyes: EOMI Lungs: clear to auscultation bilaterally Heart: regular rate and rhythm and S1, S2 normal Abdomen: Slightly distended but baseline for patient.  Tenderness to palpation noted in right quadrants, no rebound or guarding.  Bowel sounds slightly hypoactive in upper quadrants but still present and are normal and  lower quadrants Extremities: No edema Skin: mobility and turgor normal Neurologic: Grossly normal  Labs on Admission:  I have personally reviewed following labs and imaging studies Results for  orders placed or performed during the hospital encounter of 11/27/19 (from the past 24 hour(s))  CBC with Differential/Platelet     Status: Abnormal   Collection Time: 11/27/19  1:49 AM  Result Value Ref Range   WBC 14.6 (H) 4.0 - 10.5 K/uL   RBC 4.43 3.87 - 5.11 MIL/uL   Hemoglobin 14.5 12.0 - 15.0 g/dL   HCT 41.6 36 - 46 %   MCV 93.9 80.0 - 100.0 fL   MCH 32.7 26.0 - 34.0 pg   MCHC 34.9 30.0 - 36.0 g/dL   RDW 12.2 11.5 - 15.5 %   Platelets 243 150 - 400 K/uL   nRBC 0.0 0.0 - 0.2 %   Neutrophils Relative % 77 %   Neutro Abs 11.3 (H) 1.7 - 7.7 K/uL   Lymphocytes Relative 10 %   Lymphs Abs 1.4 0.7 - 4.0 K/uL   Monocytes Relative 8 %   Monocytes Absolute 1.2 (H) 0.1 - 1.0 K/uL   Eosinophils Relative 3 %   Eosinophils Absolute 0.5 0.0 - 0.5 K/uL   Basophils Relative 1 %   Basophils Absolute 0.1 0.0 - 0.1 K/uL   Immature Granulocytes 1 %   Abs Immature Granulocytes 0.07 0.00 - 0.07 K/uL  Comprehensive metabolic panel     Status: Abnormal   Collection Time: 11/27/19  1:49 AM  Result Value Ref Range   Sodium 138 135 - 145 mmol/L   Potassium 3.8 3.5 - 5.1 mmol/L   Chloride 100 98 - 111 mmol/L   CO2 27 22 - 32 mmol/L   Glucose, Bld 140 (H) 70 - 99 mg/dL   BUN 24 (H) 8 - 23 mg/dL   Creatinine, Ser 0.82 0.44 - 1.00 mg/dL   Calcium 9.0 8.9 - 10.3 mg/dL   Total Protein 6.8 6.5 - 8.1 g/dL   Albumin 4.2 3.5 - 5.0 g/dL   AST 27 15 - 41 U/L   ALT 24 0 - 44 U/L   Alkaline Phosphatase 59 38 - 126 U/L   Total Bilirubin 1.3 (H) 0.3 - 1.2 mg/dL   GFR, Estimated >60 >60 mL/min   Anion gap 11 5 - 15  Lipase, blood     Status: None   Collection Time: 11/27/19  1:49 AM  Result Value Ref Range   Lipase 27 11 - 51 U/L  Urinalysis, Routine w reflex microscopic Urine, Clean Catch     Status: Abnormal   Collection Time: 11/27/19  2:04 AM  Result Value Ref Range   Color, Urine YELLOW YELLOW   APPearance HAZY (A) CLEAR   Specific Gravity, Urine >1.030 (H) 1.005 - 1.030   pH 6.0 5.0 - 8.0    Glucose, UA NEGATIVE NEGATIVE mg/dL   Hgb urine dipstick NEGATIVE NEGATIVE   Bilirubin Urine NEGATIVE NEGATIVE   Ketones, ur 15 (A) NEGATIVE mg/dL   Protein, ur NEGATIVE NEGATIVE mg/dL   Nitrite NEGATIVE NEGATIVE   Leukocytes,Ua SMALL (A) NEGATIVE  Urinalysis, Microscopic (reflex)     Status: Abnormal   Collection Time: 11/27/19  2:04 AM  Result Value Ref Range   RBC / HPF 0-5 0 - 5 RBC/hpf   WBC, UA 11-20 0 - 5 WBC/hpf   Bacteria, UA FEW (A) NONE SEEN   Squamous Epithelial / LPF 0-5 0 - 5   Ca Oxalate Crys,  UA PRESENT   Respiratory Panel by RT PCR (Flu A&B, Covid) - Nasopharyngeal Swab     Status: None   Collection Time: 11/27/19  3:39 AM   Specimen: Nasopharyngeal Swab  Result Value Ref Range   SARS Coronavirus 2 by RT PCR NEGATIVE NEGATIVE   Influenza A by PCR NEGATIVE NEGATIVE   Influenza B by PCR NEGATIVE NEGATIVE     Radiological Exams on Admission: CT ABDOMEN PELVIS W CONTRAST  Result Date: 11/27/2019 CLINICAL DATA:  Diffuse abdominal pain EXAM: CT ABDOMEN AND PELVIS WITH CONTRAST TECHNIQUE: Multidetector CT imaging of the abdomen and pelvis was performed using the standard protocol following bolus administration of intravenous contrast. CONTRAST:  197mL OMNIPAQUE IOHEXOL 300 MG/ML  SOLN COMPARISON:  April 01, 2019 FINDINGS: Lower chest: The visualized heart size within normal limits. No pericardial fluid/thickening. No hiatal hernia. The visualized portions of the lungs are clear. Hepatobiliary: Multiple low-density lesions are seen throughout the liver parenchyma the largest within the superior right hepatic dome measuring 2 cm. These are likely hepatic cysts. No evidence of calcified gallstones, gallbladder wall thickening or biliary dilatation. Pancreas: Unremarkable. No pancreatic ductal dilatation or surrounding inflammatory changes. Spleen: Normal in size without focal abnormality. Adrenals/Urinary Tract: Both adrenal glands appear normal. The kidneys and collecting  system appear normal without evidence of urinary tract calculus or hydronephrosis. Bladder is unremarkable. Stomach/Bowel: The stomach is normal in appearance. There is mildly dilated air and fluid-filled loops of small bowel with a gradual area of narrowing seen within the [place] Vascular/Lymphatic: There are no enlarged mesenteric, retroperitoneal, or pelvic lymph nodes. No significant vascular findings are present. Reproductive: Other: No evidence of abdominal wall mass or hernia. Musculoskeletal: No acute or significant osseous findings. There is a grade 1 anterolisthesis of L4 on L5 measuring 4 mm. IMPRESSION: Findings suggestive of ileus versus partial small obstruction within the proximal to mid ileal loops. There is fecalization of the terminal ileum which could be due to true slow transit. Electronically Signed   By: Prudencio Pair M.D.   On: 11/27/2019 03:20   CT ABDOMEN PELVIS W CONTRAST  Final Result      Consults called:  Surgery  EKG: Independently reviewed. Sinus rhythm with PVC noted   Dwyane Dee, MD [facility] 11/27/2019, 6:39 AM

## 2019-11-27 NOTE — Progress Notes (Addendum)
Care started after midnight in the emergency room and patient was admitted early this morning after midnight by Dr. Dwyane Dee and I am in current agreement with this assessment plan.  Additional changes to the plan of care been made accordingly.  The patient is an overweight Caucasian female with a past medical history of multiple small bowel obstructions, history of multiple abdominal surgeries with 4 masses, hiatal hernia, arthritis, depression, history of DVT, history of TIA, hypertension, hyperlipidemia, irritable bowel syndrome as well as thoracic aortic aneurysm and palpitations and PVCs who presented to med center [facility] with worsening abdominal pain.  She typically has some chronic abdominal pain at baseline secondary to her abdominal surgeries and hernia repairs with pain recently worsened early on Saturday and states that she had 2-3 bowel movements last evening as having some flatus prior to going to [facility] but continues have some significant abdominal pain.  Since her last small bowel obstruction was in 2019 her last abdominal surgery was approximately 2018.  Last time she did not require any surgical intervention for SBO.  Currently she has no nausea or vomiting and her abdominal pain is feeling a bit better.  While in the emergency room she underwent a CT of the abdomen pelvis which revealed an ileus versus a partial small bowel obstruction within the proximal to mid ileal loops.  She does have a history of IBS with predominantly constipation takes MiraLAX daily.  She was transferred to [facility] for further monitoring and surgical evaluation.  Currently she is being admitted and treated for the following but not limited to:  Partial small bowel obstruction ([place]) -Worsening abdominal pain which is a little improved today.  No nausea nor vomiting.   -CT shows possible ileus versus partial SBO.  History of multiple abdominal surgeries places her at increased risk -Continue conservative  measures for now with close monitoring -Follow-up surgery consult and they recommended NG tube decompression to help resolve her partial small bowel obstruction versus ileus but patient refused and had one in the past and declines at this time; general surgery signed off the case now because the patient will decide to continue bowel rest but they feel like if she does not improve or worsens and would like NG surgery will reconsult and be glad to assist with management -No need for NG tube at this time unless develops nausea or vomiting -Keep magnesium above 2 and potassium above 4 -Continue fluids with normal saline and changed to normal saline plus KCl 20 mEq at 75 mL's per hour from 100 MLS per hour -keep NPO; sips and ice chips okay and will continue this for now as patient does not want her diet advanced -Last BM approximately Saturday afternoon -Repeat KUB this morning  Asymptomatic bacteriuria -UA noted with small leukocyte esterase and few bacteria, 11-20 WBC -She is asymptomatic, therefore will hold off on any treatment at this time; leukocytosis noted however may be reactive also in setting of underlying pSBO/ileus -WBC is trending down and went from 14.6 and now 13.2  Irritable bowel syndrome -Patient endorses predominant constipation.  She uses MiraLAX daily at home.  This may be contributing to her presenting symptoms and findings on CT -Follow-up surgery consult regarding above, but may benefit from more aggressive bowel regimen at home at discharge  Thoracic aortic aneurysm [facility]) -Last cardiology note reviewed from 11/23/2019.  Aneurysm is 40 mm on last echo in 2018 -She is followed closely by cardiology outpatient  History of TIA (transient  ischemic attack) -Hold home aspirin for now in case of need for surgery but surgery has currently signed off as she wants conservative measures; resume aspirin once tolerating p.o. diet  Anxiety -Hold Wellbutrin until taking orals  again  Hyperlipidemia -Hold statin for now  Hypertension -On amlodipine, losartan, Toprol.  Currently on hold while n.p.o. -Use labetalol or hydralazine as needed while she is n.p.o.  Palpitations/PVCs -Well-controlled on Toprol at home.  Follows with cardiology and was actually last evaluated on 11/23/2019 -Resume Toprol when able  Hyperbilirubinemia -Mild at 1.3 -Likely Reactive -Continue to Monitor and Trend and repeat Liver Panel this AM and tomorrow AM  Hyperglycemia -Patient blood sugar on admission was 140 and repeat was 134 -Check hemoglobin A1c in the morning and continue monitor blood sugars carefully and if necessary will place on sensitive and will sign scale insulin AC  Obesity -Complicates overall care and prognosis -Estimated body mass index is 30.08 kg/m as calculated from the following:   Height as of this encounter: 5\' 4"  (1.626 m).   Weight as of this encounter: 79.5 kg. -Weight Loss and Dietary Counseling given    We will continue to monitor patient's clinical response to intervention and continue with supportive care and IV fluid hydration.  Patient does not want NG tube decompression at this time but is now on her diet advanced as she feels that bowel rest will help.  She has had bowel movements yesterday but her biggest issue is abdominal pain yesterday.  Abdominal pain is improving.  We will encourage ambulation, ensure her electrolytes are replete, and repeat a KUB today to evaluate her partial small bowel obstruction versus ileus.  If she fails to improve significantly is recommended that she obtain a NG tube decompression and she will discuss this at this time.  Status is: Observation  The patient will require care spanning > 2 midnights and should be moved to inpatient because: Hemodynamically unstable, Unsafe d/c plan, IV treatments appropriate due to intensity of illness or inability to take PO and Inpatient level of care appropriate due to severity  of illness  Dispo: The patient is from: Home              Anticipated d/c is to: Home              Anticipated d/c date is: 2 days              Patient currently is not medically stable to d/c.

## 2019-11-27 NOTE — Assessment & Plan Note (Signed)
-  Last cardiology note reviewed from 11/23/2019.  Aneurysm is 40 mm on last echo in 2018 -She is followed closely by cardiology outpatient

## 2019-11-27 NOTE — Assessment & Plan Note (Signed)
-  Worsening abdominal pain.  No nausea nor vomiting.  CT shows possible ileus versus partial SBO.  History of multiple abdominal surgeries places her at increased risk -Continue conservative measures for now with close monitoring -Follow-up surgery consult -No need for NG tube at this time unless develops nausea or vomiting -Continue fluids -keep NPO; sips and ice chips okay -Last BM approximately Saturday afternoon

## 2019-11-27 NOTE — Assessment & Plan Note (Signed)
-  Hold statin for now

## 2019-11-27 NOTE — Assessment & Plan Note (Signed)
-  UA noted with small leukocyte esterase and few bacteria, 11-20 WBC -She is asymptomatic, therefore will hold off on any treatment at this time; leukocytosis noted however may be reactive also in setting of underlying pSBO/ileus

## 2019-11-27 NOTE — Plan of Care (Signed)

## 2019-11-27 NOTE — Plan of Care (Signed)
  Problem: Clinical Measurements: Goal: Ability to maintain clinical measurements within normal limits will improve Outcome: Progressing Goal: Will remain free from infection Outcome: Progressing Goal: Diagnostic test results will improve Outcome: Progressing   Problem: Activity: Goal: Risk for activity intolerance will decrease Outcome: Progressing   Problem: Nutrition: Goal: Adequate nutrition will be maintained Outcome: Progressing   Problem: Coping: Goal: Level of anxiety will decrease Outcome: Progressing   Problem: Pain Managment: Goal: General experience of comfort will improve Outcome: Progressing   Problem: Safety: Goal: Ability to remain free from injury will improve Outcome: Progressing   Problem: Skin Integrity: Goal: Risk for impaired skin integrity will decrease Outcome: Progressing

## 2019-11-27 NOTE — Consult Note (Signed)
CC: Abd pain  Requesting provider: Dr Alfredia Ferguson  HPI: Cynthia Peters is an 69 y.o. female who is here for abd pain and ileus vs pSBO.  Pt has h/o 4 abd mesh hernia repairs and has had 3 pSBO's in the past that have resolved with medical management.    Past Medical History:  Diagnosis Date  . Anemia    history in the past, not on a regular basis  . Arthritis   . Bronchitis   . Cataracts, bilateral    immature  . Claustrophobia    takes Valium daily as needed  . Complication of anesthesia    2013 SURGERY vocal cord bothering pt, used smaller ent tube after 2 hernia repair, no problem after . " I woke up during my MRI and colonoscopy."       . Depression    takes Wellbutrin daily  . Dry eye   . Dry mouth   . GERD (gastroesophageal reflux disease)    takes Omeprazole daily   . H/O hiatal hernia   . Headache    migraines with flashing lights   . History of blood clots 40 yrs    leg   . History of echocardiogram 2013   a. Echo (05/2011): Mild LVH, EF 65-70%, no WMA, Gr 1 DD, mild AI.  Marland Kitchen History of MRSA infection   . History of staph infection   . History of stress test 2010   a. ETT-Echo in 2010 was normal  . History of TIA (transient ischemic attack) 2013   a. Carotid US (05/2011): No ICA stenosis, pt unsure of this  . Hyperlipidemia    takes Simvastatin daily   . Hypertension    takes  Losartan daily  . IBS (irritable bowel syndrome)    takes Bentyl daily as needed  . Insomnia    takes Ambien nightly  . Interstitial cystitis   . Lichen planus   . Mild asthma    Albuterol inhaler as needed  . Palpitations    Event monitor in 2007 with rare PVCs // takes Metoprolol daily  . PVC (premature ventricular contraction)   . Seasonal allergies    takes Claritin daily as needed.Takes Singulair daily as needed.  Marland Kitchen TIA (transient ischemic attack) 05/27/2011  . Urethral stricture   . Vertigo    takes Meclizine daily as needed    Past Surgical History:  Procedure  Laterality Date  . CESAREAN SECTION    . COLONOSCOPY WITH PROPOFOL N/A 08/10/2012   Procedure: COLONOSCOPY WITH PROPOFOL;  Surgeon: Garlan Fair, MD;  Location: [facility];  Service: Endoscopy;  Laterality: N/A;  . CYSTOSCOPY WITH URETHRAL DILATATION N/A 02/18/2013   Procedure: CYSTOSCOPY WITH URETHRAL DILATATION ( NO BALLOON) AND HYDRODISTENSION;  Surgeon: Alexis Frock, MD;  Location: [facility];  Service: Urology;  Laterality: N/A;  . ENDOMETRIAL ABLATION W/ HYDROTHERMABLATOR  09-27-2002  . INCISIONAL HERNIA REPAIR N/A 10/25/2014   Procedure: HERNIA REPAIR INCISIONAL;  Surgeon: Ralene Ok, MD;  Location: [facility];  Service: General;  Laterality: N/A;  . INCISIONAL HERNIA REPAIR N/A 03/26/2015   Procedure: LAPAROSCOPIC INCISIONAL HERNIA;  Surgeon: Ralene Ok, MD;  Location: [facility];  Service: General;  Laterality: N/A;  . INSERTION OF MESH N/A 10/25/2014   Procedure: INSERTION OF MESH;  Surgeon: Ralene Ok, MD;  Location: [facility];  Service: General;  Laterality: N/A;  . LAPAROSCOPIC LYSIS OF ADHESIONS N/A 03/26/2015   Procedure: LAPAROSCOPIC LYSIS OF ADHESIONS;  Surgeon: Ralene Ok, MD;  Location: [facility];  Service: General;  Laterality: N/A;  . LAPAROTOMY N/A 10/25/2014   Procedure: EXPLORATORY LAPAROTOMY;  Surgeon: Ralene Ok, MD;  Location: [facility];  Service: General;  Laterality: N/A;  . LYSIS OF ADHESION N/A 10/25/2014   Procedure: LYSIS OF ADHESIONS ;  Surgeon: Ralene Ok, MD;  Location: [facility];  Service: General;  Laterality: N/A;  . MUCOSAL ADVANCEMENT FLAP N/A 10/25/2014   Procedure: MUCOSAL ADVANCEMENT FLAP;  Surgeon: Ralene Ok, MD;  Location: [facility];  Service: General;  Laterality: N/A;  . ORIF RADIAL FRACTURE Right 06/17/2016   Procedure: right radial head arthroplasty with ligament repair, reconstruciton;  Surgeon: Roseanne Kaufman, MD;  Location: [facility];  Service: Orthopedics;  Laterality: Right;  . RADIAL HEAD ARTHROPLASTY Right 06/17/2016  .  TOOTH EXTRACTION    . TRANSTHORACIC ECHOCARDIOGRAM  05-26-2011   MILD LVH/  EF 32-99%/  GRADE I DIASTOLIC DYSFUNCTION/  MILD AR//   06-20-2008  NOMRAL STRESS ECHO  . UMBILICAL HERNIA REPAIR  2013   AND REVISION THE SAME YEAR W/ MESH  . WISDOM TOOTH EXTRACTION  AGE 68    Family History  Problem Relation Age of Onset  . Dementia Mother   . Leukemia Child   . Cirrhosis Father     Social:  reports that she quit smoking about 43 years ago. Her smoking use included cigarettes. She has a 0.50 pack-year smoking history. She has never used smokeless tobacco. She reports current alcohol use. She reports that she does not use drugs.  Allergies:  Allergies  Allergen Reactions  . Hydrocodone Rash  . Hydrocodone-Acetaminophen Rash  . Tramadol Palpitations    Medications: I have reviewed the patient's current medications.  Results for orders placed or performed during the hospital encounter of 11/27/19 (from the past 48 hour(s))  CBC with Differential/Platelet     Status: Abnormal   Collection Time: 11/27/19  1:49 AM  Result Value Ref Range   WBC 14.6 (H) 4.0 - 10.5 K/uL   RBC 4.43 3.87 - 5.11 MIL/uL   Hemoglobin 14.5 12.0 - 15.0 g/dL   HCT 41.6 36 - 46 %   MCV 93.9 80.0 - 100.0 fL   MCH 32.7 26.0 - 34.0 pg   MCHC 34.9 30.0 - 36.0 g/dL   RDW 12.2 11.5 - 15.5 %   Platelets 243 150 - 400 K/uL   nRBC 0.0 0.0 - 0.2 %   Neutrophils Relative % 77 %   Neutro Abs 11.3 (H) 1.7 - 7.7 K/uL   Lymphocytes Relative 10 %   Lymphs Abs 1.4 0.7 - 4.0 K/uL   Monocytes Relative 8 %   Monocytes Absolute 1.2 (H) 0.1 - 1.0 K/uL   Eosinophils Relative 3 %   Eosinophils Absolute 0.5 0.0 - 0.5 K/uL   Basophils Relative 1 %   Basophils Absolute 0.1 0.0 - 0.1 K/uL   Immature Granulocytes 1 %   Abs Immature Granulocytes 0.07 0.00 - 0.07 K/uL    Comment: Performed at [facility], [address] [place], [place] [address]  Comprehensive metabolic panel     Status: Abnormal   Collection Time:  11/27/19  1:49 AM  Result Value Ref Range   Sodium 138 135 - 145 mmol/L   Potassium 3.8 3.5 - 5.1 mmol/L   Chloride 100 98 - 111 mmol/L   CO2 27 22 - 32 mmol/L   Glucose, Bld 140 (H) 70 - 99 mg/dL    Comment: Glucose reference range applies only  to samples taken after fasting for at least 8 hours.   BUN 24 (H) 8 - 23 mg/dL   Creatinine, Ser 0.82 0.44 - 1.00 mg/dL   Calcium 9.0 8.9 - 10.3 mg/dL   Total Protein 6.8 6.5 - 8.1 g/dL   Albumin 4.2 3.5 - 5.0 g/dL   AST 27 15 - 41 U/L   ALT 24 0 - 44 U/L   Alkaline Phosphatase 59 38 - 126 U/L   Total Bilirubin 1.3 (H) 0.3 - 1.2 mg/dL   GFR, Estimated >60 >60 mL/min    Comment: (NOTE) Calculated using the CKD-EPI Creatinine Equation (2021)    Anion gap 11 5 - 15    Comment: Performed at [facility], [address] [place], [place] [address]  Lipase, blood     Status: None   Collection Time: 11/27/19  1:49 AM  Result Value Ref Range   Lipase 27 11 - 51 U/L    Comment: Performed at [facility], [address] [place], [place] [address]  Urinalysis, Routine w reflex microscopic Urine, Clean Catch     Status: Abnormal   Collection Time: 11/27/19  2:04 AM  Result Value Ref Range   Color, Urine YELLOW YELLOW   APPearance HAZY (A) CLEAR   Specific Gravity, Urine >1.030 (H) 1.005 - 1.030   pH 6.0 5.0 - 8.0   Glucose, UA NEGATIVE NEGATIVE mg/dL   Hgb urine dipstick NEGATIVE NEGATIVE   Bilirubin Urine NEGATIVE NEGATIVE   Ketones, ur 15 (A) NEGATIVE mg/dL   Protein, ur NEGATIVE NEGATIVE mg/dL   Nitrite NEGATIVE NEGATIVE   Leukocytes,Ua SMALL (A) NEGATIVE    Comment: Performed at [facility], [address] [place], [place] [address]  Urinalysis, Microscopic (reflex)     Status: Abnormal   Collection Time: 11/27/19  2:04 AM  Result Value Ref Range   RBC / HPF 0-5 0 - 5 RBC/hpf   WBC, UA 11-20 0 - 5 WBC/hpf   Bacteria, UA FEW (A) NONE SEEN   Squamous Epithelial / LPF 0-5 0 - 5   Ca Oxalate Crys, UA  PRESENT     Comment: Performed at [facility], [address] [place], [place] [address]  Respiratory Panel by RT PCR (Flu A&B, Covid) - Nasopharyngeal Swab     Status: None   Collection Time: 11/27/19  3:39 AM   Specimen: Nasopharyngeal Swab  Result Value Ref Range   SARS Coronavirus 2 by RT PCR NEGATIVE NEGATIVE    Comment: (NOTE) SARS-CoV-2 target nucleic acids are NOT DETECTED.  The SARS-CoV-2 RNA is generally detectable in upper respiratoy specimens during the acute phase of infection. The lowest concentration of SARS-CoV-2 viral copies this assay can detect is 131 copies/mL. A negative result does not preclude SARS-Cov-2 infection and should not be used as the sole basis for treatment or other patient management decisions. A negative result may occur with  improper specimen collection/handling, submission of specimen other than nasopharyngeal swab, presence of viral mutation(s) within the areas targeted by this assay, and inadequate number of viral copies (<131 copies/mL). A negative result must be combined with clinical observations, patient history, and epidemiological information. The expected result is Negative.  Fact Sheet for Patients:  PinkCheek.be  Fact Sheet for Healthcare Providers:  GravelBags.it  This test is no t yet approved or cleared by the [place] FDA and  has been authorized for detection and/or diagnosis of SARS-CoV-2 by  FDA under an Emergency Use Authorization (EUA). This EUA will remain  in effect (meaning this test can be used) for the duration of the COVID-19 declaration under Section 564(b)(1) of the Act, 21 U.S.C. section 360bbb-3(b)(1), unless the authorization is terminated or revoked sooner.     Influenza A by PCR NEGATIVE NEGATIVE   Influenza B by PCR NEGATIVE NEGATIVE    Comment: (NOTE) The Xpert Xpress SARS-CoV-2/FLU/RSV assay is intended as an aid in  the  diagnosis of influenza from Nasopharyngeal swab specimens and  should not be used as a sole basis for treatment. Nasal washings and  aspirates are unacceptable for Xpert Xpress SARS-CoV-2/FLU/RSV  testing.  Fact Sheet for Patients: PinkCheek.be  Fact Sheet for Healthcare Providers: GravelBags.it  This test is not yet approved or cleared by the [place] FDA and  has been authorized for detection and/or diagnosis of SARS-CoV-2 by  FDA under an Emergency Use Authorization (EUA). This EUA will remain  in effect (meaning this test can be used) for the duration of the  Covid-19 declaration under Section 564(b)(1) of the Act, 21  U.S.C. section 360bbb-3(b)(1), unless the authorization is  terminated or revoked. Performed at [facility], [address] [place], [place] [address]   Basic metabolic panel     Status: Abnormal   Collection Time: 11/27/19  6:47 AM  Result Value Ref Range   Sodium 138 135 - 145 mmol/L   Potassium 4.3 3.5 - 5.1 mmol/L   Chloride 102 98 - 111 mmol/L   CO2 27 22 - 32 mmol/L   Glucose, Bld 134 (H) 70 - 99 mg/dL    Comment: Glucose reference range applies only to samples taken after fasting for at least 8 hours.   BUN 21 8 - 23 mg/dL   Creatinine, Ser 0.78 0.44 - 1.00 mg/dL   Calcium 8.8 (L) 8.9 - 10.3 mg/dL   GFR, Estimated >60 >60 mL/min    Comment: (NOTE) Calculated using the CKD-EPI Creatinine Equation (2021)    Anion gap 9 5 - 15    Comment: Performed at [facility], [place] [address], [place], [place] [address]  CBC     Status: Abnormal   Collection Time: 11/27/19  6:47 AM  Result Value Ref Range   WBC 13.2 (H) 4.0 - 10.5 K/uL   RBC 4.08 3.87 - 5.11 MIL/uL   Hemoglobin 13.6 12.0 - 15.0 g/dL   HCT 39.4 36 - 46 %   MCV 96.6 80.0 - 100.0 fL   MCH 33.3 26.0 - 34.0 pg   MCHC 34.5 30.0 - 36.0 g/dL   RDW 12.2 11.5 - 15.5 %   Platelets 241 150 - 400 K/uL   nRBC  0.0 0.0 - 0.2 %    Comment: Performed at [facility], [place] [address], [place], [place] [address]  Magnesium     Status: None   Collection Time: 11/27/19  6:47 AM  Result Value Ref Range   Magnesium 2.1 1.7 - 2.4 mg/dL    Comment: Performed at [facility], [place] [address], [place], [place] [address]    CT ABDOMEN PELVIS W CONTRAST  Result Date: 11/27/2019 CLINICAL DATA:  Diffuse abdominal pain EXAM: CT ABDOMEN AND PELVIS WITH CONTRAST TECHNIQUE: Multidetector CT imaging of the abdomen and pelvis was performed using the standard protocol following bolus administration of intravenous contrast. CONTRAST:  145mL OMNIPAQUE IOHEXOL 300 MG/ML  SOLN COMPARISON:  April 01, 2019 FINDINGS: Lower chest:  The visualized heart size within normal limits. No pericardial fluid/thickening. No hiatal hernia. The visualized portions of the lungs are clear. Hepatobiliary: Multiple low-density lesions are seen throughout the liver parenchyma the largest within the superior right hepatic dome measuring 2 cm. These are likely hepatic cysts. No evidence of calcified gallstones, gallbladder wall thickening or biliary dilatation. Pancreas: Unremarkable. No pancreatic ductal dilatation or surrounding inflammatory changes. Spleen: Normal in size without focal abnormality. Adrenals/Urinary Tract: Both adrenal glands appear normal. The kidneys and collecting system appear normal without evidence of urinary tract calculus or hydronephrosis. Bladder is unremarkable. Stomach/Bowel: The stomach is normal in appearance. There is mildly dilated air and fluid-filled loops of small bowel with a gradual area of narrowing seen within the [place] Vascular/Lymphatic: There are no enlarged mesenteric, retroperitoneal, or pelvic lymph nodes. No significant vascular findings are present. Reproductive: Other: No evidence of abdominal wall mass or hernia. Musculoskeletal: No acute or significant osseous findings.  There is a grade 1 anterolisthesis of L4 on L5 measuring 4 mm. IMPRESSION: Findings suggestive of ileus versus partial small obstruction within the proximal to mid ileal loops. There is fecalization of the terminal ileum which could be due to true slow transit. Electronically Signed   By: Prudencio Pair M.D.   On: 11/27/2019 03:20    ROS - all of the below systems have been reviewed with the patient and positives are indicated with bold text General: chills, fever or night sweats Eyes: blurry vision or double vision ENT: epistaxis or sore throat Allergy/Immunology: itchy/watery eyes or nasal congestion Hematologic/Lymphatic: bleeding problems, blood clots or swollen lymph nodes Endocrine: temperature intolerance or unexpected weight changes Breast: new or changing breast lumps or nipple discharge Resp: cough, shortness of breath, or wheezing CV: chest pain or dyspnea on exertion GI: as per HPI GU: dysuria, trouble voiding, or hematuria MSK: joint pain or joint stiffness Neuro: TIA or stroke symptoms Derm: pruritus and skin lesion changes Psych: anxiety and depression  PE Blood pressure 137/76, pulse 63, temperature 98.6 F (37 C), temperature source Oral, resp. rate 18, height 5\' 4"  (1.626 m), weight 79.5 kg, SpO2 100 %. Constitutional: NAD; conversant; no deformities Eyes: Moist conjunctiva; no lid lag; anicteric; PERRL Neck: Trachea midline; no thyromegaly Lungs: Normal respiratory effort; no tactile fremitus CV: RRR; no palpable thrills; no pitting edema GI: Abd soft, non-tender, mildly distended; no palpable hepatosplenomegaly MSK: Normal range of motion of extremities; no clubbing/cyanosis Psychiatric: Appropriate affect; alert and oriented x3 Lymphatic: No palpable cervical or axillary lymphadenopathy  Results for orders placed or performed during the hospital encounter of 11/27/19 (from the past 48 hour(s))  CBC with Differential/Platelet     Status: Abnormal   Collection  Time: 11/27/19  1:49 AM  Result Value Ref Range   WBC 14.6 (H) 4.0 - 10.5 K/uL   RBC 4.43 3.87 - 5.11 MIL/uL   Hemoglobin 14.5 12.0 - 15.0 g/dL   HCT 41.6 36 - 46 %   MCV 93.9 80.0 - 100.0 fL   MCH 32.7 26.0 - 34.0 pg   MCHC 34.9 30.0 - 36.0 g/dL   RDW 12.2 11.5 - 15.5 %   Platelets 243 150 - 400 K/uL   nRBC 0.0 0.0 - 0.2 %   Neutrophils Relative % 77 %   Neutro Abs 11.3 (H) 1.7 - 7.7 K/uL   Lymphocytes Relative 10 %   Lymphs Abs 1.4 0.7 - 4.0 K/uL   Monocytes Relative 8 %   Monocytes Absolute 1.2 (H) 0.1 -  1.0 K/uL   Eosinophils Relative 3 %   Eosinophils Absolute 0.5 0.0 - 0.5 K/uL   Basophils Relative 1 %   Basophils Absolute 0.1 0.0 - 0.1 K/uL   Immature Granulocytes 1 %   Abs Immature Granulocytes 0.07 0.00 - 0.07 K/uL    Comment: Performed at [facility], [address] [place], [place] [address]  Comprehensive metabolic panel     Status: Abnormal   Collection Time: 11/27/19  1:49 AM  Result Value Ref Range   Sodium 138 135 - 145 mmol/L   Potassium 3.8 3.5 - 5.1 mmol/L   Chloride 100 98 - 111 mmol/L   CO2 27 22 - 32 mmol/L   Glucose, Bld 140 (H) 70 - 99 mg/dL    Comment: Glucose reference range applies only to samples taken after fasting for at least 8 hours.   BUN 24 (H) 8 - 23 mg/dL   Creatinine, Ser 0.82 0.44 - 1.00 mg/dL   Calcium 9.0 8.9 - 10.3 mg/dL   Total Protein 6.8 6.5 - 8.1 g/dL   Albumin 4.2 3.5 - 5.0 g/dL   AST 27 15 - 41 U/L   ALT 24 0 - 44 U/L   Alkaline Phosphatase 59 38 - 126 U/L   Total Bilirubin 1.3 (H) 0.3 - 1.2 mg/dL   GFR, Estimated >60 >60 mL/min    Comment: (NOTE) Calculated using the CKD-EPI Creatinine Equation (2021)    Anion gap 11 5 - 15    Comment: Performed at [facility], [address] [place], [place] [address]  Lipase, blood     Status: None   Collection Time: 11/27/19  1:49 AM  Result Value Ref Range   Lipase 27 11 - 51 U/L    Comment: Performed at [facility], [address]  [place], [place] [address]  Urinalysis, Routine w reflex microscopic Urine, Clean Catch     Status: Abnormal   Collection Time: 11/27/19  2:04 AM  Result Value Ref Range   Color, Urine YELLOW YELLOW   APPearance HAZY (A) CLEAR   Specific Gravity, Urine >1.030 (H) 1.005 - 1.030   pH 6.0 5.0 - 8.0   Glucose, UA NEGATIVE NEGATIVE mg/dL   Hgb urine dipstick NEGATIVE NEGATIVE   Bilirubin Urine NEGATIVE NEGATIVE   Ketones, ur 15 (A) NEGATIVE mg/dL   Protein, ur NEGATIVE NEGATIVE mg/dL   Nitrite NEGATIVE NEGATIVE   Leukocytes,Ua SMALL (A) NEGATIVE    Comment: Performed at [facility], [address] [place], [place] [address]  Urinalysis, Microscopic (reflex)     Status: Abnormal   Collection Time: 11/27/19  2:04 AM  Result Value Ref Range   RBC / HPF 0-5 0 - 5 RBC/hpf   WBC, UA 11-20 0 - 5 WBC/hpf   Bacteria, UA FEW (A) NONE SEEN   Squamous Epithelial / LPF 0-5 0 - 5   Ca Oxalate Crys, UA PRESENT     Comment: Performed at [facility], [address] [place], [place] [address]  Respiratory Panel by RT PCR (Flu A&B, Covid) - Nasopharyngeal Swab     Status: None   Collection Time: 11/27/19  3:39 AM   Specimen: Nasopharyngeal Swab  Result Value Ref Range   SARS Coronavirus 2 by RT PCR NEGATIVE NEGATIVE    Comment: (NOTE) SARS-CoV-2 target nucleic acids are NOT DETECTED.  The SARS-CoV-2 RNA is generally detectable in upper respiratoy specimens during  the acute phase of infection. The lowest concentration of SARS-CoV-2 viral copies this assay can detect is 131 copies/mL. A negative result does not preclude SARS-Cov-2 infection and should not be used as the sole basis for treatment or other patient management decisions. A negative result may occur with  improper specimen collection/handling, submission of specimen other than nasopharyngeal swab, presence of viral mutation(s) within the areas targeted by this assay, and inadequate number of viral copies (<131  copies/mL). A negative result must be combined with clinical observations, patient history, and epidemiological information. The expected result is Negative.  Fact Sheet for Patients:  PinkCheek.be  Fact Sheet for Healthcare Providers:  GravelBags.it  This test is no t yet approved or cleared by the [place] FDA and  has been authorized for detection and/or diagnosis of SARS-CoV-2 by FDA under an Emergency Use Authorization (EUA). This EUA will remain  in effect (meaning this test can be used) for the duration of the COVID-19 declaration under Section 564(b)(1) of the Act, 21 U.S.C. section 360bbb-3(b)(1), unless the authorization is terminated or revoked sooner.     Influenza A by PCR NEGATIVE NEGATIVE   Influenza B by PCR NEGATIVE NEGATIVE    Comment: (NOTE) The Xpert Xpress SARS-CoV-2/FLU/RSV assay is intended as an aid in  the diagnosis of influenza from Nasopharyngeal swab specimens and  should not be used as a sole basis for treatment. Nasal washings and  aspirates are unacceptable for Xpert Xpress SARS-CoV-2/FLU/RSV  testing.  Fact Sheet for Patients: PinkCheek.be  Fact Sheet for Healthcare Providers: GravelBags.it  This test is not yet approved or cleared by the [place] FDA and  has been authorized for detection and/or diagnosis of SARS-CoV-2 by  FDA under an Emergency Use Authorization (EUA). This EUA will remain  in effect (meaning this test can be used) for the duration of the  Covid-19 declaration under Section 564(b)(1) of the Act, 21  U.S.C. section 360bbb-3(b)(1), unless the authorization is  terminated or revoked. Performed at [facility], [address] [place], [place] [address]   Basic metabolic panel     Status: Abnormal   Collection Time: 11/27/19  6:47 AM  Result Value Ref Range   Sodium 138 135 - 145 mmol/L    Potassium 4.3 3.5 - 5.1 mmol/L   Chloride 102 98 - 111 mmol/L   CO2 27 22 - 32 mmol/L   Glucose, Bld 134 (H) 70 - 99 mg/dL    Comment: Glucose reference range applies only to samples taken after fasting for at least 8 hours.   BUN 21 8 - 23 mg/dL   Creatinine, Ser 0.78 0.44 - 1.00 mg/dL   Calcium 8.8 (L) 8.9 - 10.3 mg/dL   GFR, Estimated >60 >60 mL/min    Comment: (NOTE) Calculated using the CKD-EPI Creatinine Equation (2021)    Anion gap 9 5 - 15    Comment: Performed at [facility], [place] [address], [place], [place] [address]  CBC     Status: Abnormal   Collection Time: 11/27/19  6:47 AM  Result Value Ref Range   WBC 13.2 (H) 4.0 - 10.5 K/uL   RBC 4.08 3.87 - 5.11 MIL/uL   Hemoglobin 13.6 12.0 - 15.0 g/dL   HCT 39.4 36 - 46 %   MCV 96.6 80.0 - 100.0 fL   MCH 33.3 26.0 - 34.0 pg   MCHC 34.5 30.0 - 36.0 g/dL   RDW 12.2 11.5 - 15.5 %  Platelets 241 150 - 400 K/uL   nRBC 0.0 0.0 - 0.2 %    Comment: Performed at [facility], [place] [address] [place], [place] [address]  Magnesium     Status: None   Collection Time: 11/27/19  6:47 AM  Result Value Ref Range   Magnesium 2.1 1.7 - 2.4 mg/dL    Comment: Performed at [facility], [place] [address], [place], [place] [address]    CT ABDOMEN PELVIS W CONTRAST  Result Date: 11/27/2019 CLINICAL DATA:  Diffuse abdominal pain EXAM: CT ABDOMEN AND PELVIS WITH CONTRAST TECHNIQUE: Multidetector CT imaging of the abdomen and pelvis was performed using the standard protocol following bolus administration of intravenous contrast. CONTRAST:  133mL OMNIPAQUE IOHEXOL 300 MG/ML  SOLN COMPARISON:  April 01, 2019 FINDINGS: Lower chest: The visualized heart size within normal limits. No pericardial fluid/thickening. No hiatal hernia. The visualized portions of the lungs are clear. Hepatobiliary: Multiple low-density lesions are seen throughout the liver parenchyma the largest within the superior  right hepatic dome measuring 2 cm. These are likely hepatic cysts. No evidence of calcified gallstones, gallbladder wall thickening or biliary dilatation. Pancreas: Unremarkable. No pancreatic ductal dilatation or surrounding inflammatory changes. Spleen: Normal in size without focal abnormality. Adrenals/Urinary Tract: Both adrenal glands appear normal. The kidneys and collecting system appear normal without evidence of urinary tract calculus or hydronephrosis. Bladder is unremarkable. Stomach/Bowel: The stomach is normal in appearance. There is mildly dilated air and fluid-filled loops of small bowel with a gradual area of narrowing seen within the [place] Vascular/Lymphatic: There are no enlarged mesenteric, retroperitoneal, or pelvic lymph nodes. No significant vascular findings are present. Reproductive: Other: No evidence of abdominal wall mass or hernia. Musculoskeletal: No acute or significant osseous findings. There is a grade 1 anterolisthesis of L4 on L5 measuring 4 mm. IMPRESSION: Findings suggestive of ileus versus partial small obstruction within the proximal to mid ileal loops. There is fecalization of the terminal ileum which could be due to true slow transit. Electronically Signed   By: Prudencio Pair M.D.   On: 11/27/2019 03:20     A/P: Cynthia Peters is an 69 y.o. female with a pSBO vs ileus.  We discussed that our approach to resolve this would be NGT decompression.  Pt does not want to try this at this time and would like to continue just bowel rest.  Will sign off for now.  If pt does not improve or worsens and would like an NG, we will be glad to assist with this management.   Rosario Adie, MD  [facility]

## 2019-11-27 NOTE — Assessment & Plan Note (Signed)
-  Hold home aspirin for now in case of need for surgery

## 2019-11-27 NOTE — Assessment & Plan Note (Signed)
-  Hold Wellbutrin until taking orals again

## 2019-11-28 ENCOUNTER — Observation Stay (HOSPITAL_COMMUNITY): Payer: Medicare PPO

## 2019-11-28 DIAGNOSIS — Z8673 Personal history of transient ischemic attack (TIA), and cerebral infarction without residual deficits: Secondary | ICD-10-CM | POA: Diagnosis not present

## 2019-11-28 DIAGNOSIS — F419 Anxiety disorder, unspecified: Secondary | ICD-10-CM | POA: Diagnosis not present

## 2019-11-28 DIAGNOSIS — R8271 Bacteriuria: Secondary | ICD-10-CM | POA: Diagnosis not present

## 2019-11-28 DIAGNOSIS — K56600 Partial intestinal obstruction, unspecified as to cause: Secondary | ICD-10-CM | POA: Diagnosis not present

## 2019-11-28 LAB — COMPREHENSIVE METABOLIC PANEL
ALT: 21 U/L (ref 0–44)
AST: 27 U/L (ref 15–41)
Albumin: 3.6 g/dL (ref 3.5–5.0)
Alkaline Phosphatase: 51 U/L (ref 38–126)
Anion gap: 9 (ref 5–15)
BUN: 11 mg/dL (ref 8–23)
CO2: 24 mmol/L (ref 22–32)
Calcium: 9 mg/dL (ref 8.9–10.3)
Chloride: 107 mmol/L (ref 98–111)
Creatinine, Ser: 0.81 mg/dL (ref 0.44–1.00)
GFR, Estimated: >60 mL/min (ref >60)
Glucose, Bld: 84 mg/dL (ref 70–99)
Potassium: 4 mmol/L (ref 3.5–5.1)
Sodium: 140 mmol/L (ref 135–145)
Total Bilirubin: 1.4 mg/dL — ABNORMAL HIGH (ref 0.3–1.2)
Total Protein: 6 g/dL — ABNORMAL LOW (ref 6.5–8.1)

## 2019-11-28 LAB — CBC WITH DIFFERENTIAL/PLATELET
Abs Immature Granulocytes: 0.03 10*3/uL (ref 0.00–0.07)
Basophils Absolute: 0.1 10*3/uL (ref 0.0–0.1)
Basophils Relative: 1 %
Eosinophils Absolute: 0.7 10*3/uL — ABNORMAL HIGH (ref 0.0–0.5)
Eosinophils Relative: 7 %
HCT: 41.7 % (ref 36.0–46.0)
Hemoglobin: 13.9 g/dL (ref 12.0–15.0)
Immature Granulocytes: 0 %
Lymphocytes Relative: 25 %
Lymphs Abs: 2.5 10*3/uL (ref 0.7–4.0)
MCH: 32.9 pg (ref 26.0–34.0)
MCHC: 33.3 g/dL (ref 30.0–36.0)
MCV: 98.6 fL (ref 80.0–100.0)
Monocytes Absolute: 1.1 10*3/uL — ABNORMAL HIGH (ref 0.1–1.0)
Monocytes Relative: 11 %
Neutro Abs: 5.7 10*3/uL (ref 1.7–7.7)
Neutrophils Relative %: 56 %
Platelets: 246 10*3/uL (ref 150–400)
RBC: 4.23 MIL/uL (ref 3.87–5.11)
RDW: 12.4 % (ref 11.5–15.5)
WBC: 10 10*3/uL (ref 4.0–10.5)
nRBC: 0 % (ref 0.0–0.2)

## 2019-11-28 LAB — MAGNESIUM: Magnesium: 2.1 mg/dL (ref 1.7–2.4)

## 2019-11-28 LAB — PHOSPHORUS: Phosphorus: 3.2 mg/dL (ref 2.5–4.6)

## 2019-11-28 MED ORDER — VITAMIN D 25 MCG (1000 UNIT) PO TABS
2000.0000 [IU] | ORAL_TABLET | Freq: Every day | ORAL | Status: DC
  Administered 2019-11-28 – 2019-11-30 (×3): 2000 [IU] via ORAL
  Filled 2019-11-28 (×3): qty 2

## 2019-11-28 MED ORDER — VALACYCLOVIR HCL 500 MG PO TABS
500.0000 mg | ORAL_TABLET | Freq: Every day | ORAL | Status: DC
  Administered 2019-11-28 – 2019-11-30 (×3): 500 mg via ORAL
  Filled 2019-11-28 (×3): qty 1

## 2019-11-28 MED ORDER — PROSOURCE PLUS PO LIQD
30.0000 mL | Freq: Two times a day (BID) | ORAL | Status: DC
  Administered 2019-11-28: 30 mL via ORAL
  Filled 2019-11-28 (×3): qty 30

## 2019-11-28 MED ORDER — LOSARTAN POTASSIUM 50 MG PO TABS
100.0000 mg | ORAL_TABLET | Freq: Every day | ORAL | Status: DC
  Administered 2019-11-28 – 2019-11-30 (×3): 100 mg via ORAL
  Filled 2019-11-28 (×3): qty 2

## 2019-11-28 MED ORDER — SENNOSIDES-DOCUSATE SODIUM 8.6-50 MG PO TABS
1.0000 | ORAL_TABLET | Freq: Two times a day (BID) | ORAL | Status: DC
  Administered 2019-11-28 – 2019-11-30 (×5): 1 via ORAL
  Filled 2019-11-28 (×5): qty 1

## 2019-11-28 MED ORDER — DICYCLOMINE HCL 10 MG PO CAPS
10.0000 mg | ORAL_CAPSULE | ORAL | Status: DC | PRN
  Filled 2019-11-28: qty 1

## 2019-11-28 MED ORDER — POLYETHYLENE GLYCOL 3350 17 G PO PACK
17.0000 g | PACK | Freq: Two times a day (BID) | ORAL | Status: DC
  Administered 2019-11-28 – 2019-11-30 (×5): 17 g via ORAL
  Filled 2019-11-28 (×5): qty 1

## 2019-11-28 MED ORDER — PANTOPRAZOLE SODIUM 40 MG PO TBEC
40.0000 mg | DELAYED_RELEASE_TABLET | Freq: Every day | ORAL | Status: DC
  Administered 2019-11-28 – 2019-11-30 (×3): 40 mg via ORAL
  Filled 2019-11-28 (×3): qty 1

## 2019-11-28 MED ORDER — METOPROLOL SUCCINATE ER 100 MG PO TB24
100.0000 mg | ORAL_TABLET | Freq: Two times a day (BID) | ORAL | Status: DC
  Administered 2019-11-28 – 2019-11-30 (×5): 100 mg via ORAL
  Filled 2019-11-28 (×5): qty 1

## 2019-11-28 MED ORDER — LORATADINE 10 MG PO TABS
10.0000 mg | ORAL_TABLET | Freq: Every day | ORAL | Status: DC
  Administered 2019-11-28 – 2019-11-30 (×3): 10 mg via ORAL
  Filled 2019-11-28 (×3): qty 1

## 2019-11-28 MED ORDER — MONTELUKAST SODIUM 10 MG PO TABS
10.0000 mg | ORAL_TABLET | Freq: Every day | ORAL | Status: DC
  Administered 2019-11-28 – 2019-11-29 (×2): 10 mg via ORAL
  Filled 2019-11-28 (×2): qty 1

## 2019-11-28 MED ORDER — ALBUTEROL SULFATE HFA 108 (90 BASE) MCG/ACT IN AERS: 2.0000 | INHALATION_SPRAY | Freq: Four times a day (QID) | RESPIRATORY_TRACT | Status: DC | PRN

## 2019-11-28 MED ORDER — AMLODIPINE BESYLATE 5 MG PO TABS
2.5000 mg | ORAL_TABLET | Freq: Two times a day (BID) | ORAL | Status: DC
  Administered 2019-11-28 – 2019-11-30 (×5): 2.5 mg via ORAL
  Filled 2019-11-28 (×5): qty 1

## 2019-11-28 MED ORDER — BUPROPION HCL ER (XL) 300 MG PO TB24
300.0000 mg | ORAL_TABLET | Freq: Every day | ORAL | Status: DC
  Administered 2019-11-28 – 2019-11-30 (×3): 300 mg via ORAL
  Filled 2019-11-28 (×3): qty 1

## 2019-11-28 MED ORDER — SIMVASTATIN 40 MG PO TABS
40.0000 mg | ORAL_TABLET | Freq: Every day | ORAL | Status: DC
  Administered 2019-11-28 – 2019-11-30 (×3): 40 mg via ORAL
  Filled 2019-11-28 (×3): qty 1

## 2019-11-28 MED ORDER — ASCORBIC ACID 500 MG PO TABS
2000.0000 mg | ORAL_TABLET | Freq: Two times a day (BID) | ORAL | Status: DC
  Administered 2019-11-28 – 2019-11-30 (×4): 2000 mg via ORAL
  Filled 2019-11-28 (×4): qty 4

## 2019-11-28 MED ORDER — ADULT MULTIVITAMIN W/MINERALS CH
1.0000 | ORAL_TABLET | Freq: Every day | ORAL | Status: DC
  Administered 2019-11-28 – 2019-11-30 (×3): 1 via ORAL
  Filled 2019-11-28 (×3): qty 1

## 2019-11-28 MED ORDER — ASPIRIN 81 MG PO CHEW
81.0000 mg | CHEWABLE_TABLET | Freq: Every day | ORAL | Status: DC
  Administered 2019-11-28 – 2019-11-30 (×3): 81 mg via ORAL
  Filled 2019-11-28 (×3): qty 1

## 2019-11-28 MED ORDER — BISACODYL 10 MG RE SUPP: 10.0000 mg | Freq: Every day | RECTAL | Status: DC | PRN

## 2019-11-28 MED ORDER — FLUTICASONE PROPIONATE 50 MCG/ACT NA SUSP
2.0000 | Freq: Every day | NASAL | Status: DC | PRN
  Filled 2019-11-28: qty 16

## 2019-11-28 NOTE — Progress Notes (Signed)
PROGRESS NOTE    Cynthia Peters  BJY:[id number] DOB: [birth year] DOA: 11/27/2019 PCP: Aletha Halim., PA-C   Brief Narrative:  The patient is an overweight Caucasian female with a past medical history of multiple small bowel obstructions, history of multiple abdominal surgeries with 4 masses, hiatal hernia, arthritis, depression, history of DVT, history of TIA, hypertension, hyperlipidemia, irritable bowel syndrome as well as thoracic aortic aneurysm and palpitations and PVCs who presented to med center [facility] with worsening abdominal pain.  She typically has some chronic abdominal pain at baseline secondary to her abdominal surgeries and hernia repairs with pain recently worsened early on Saturday and states that she had 2-3 bowel movements last evening as having some flatus prior to going to [facility] but continues have some significant abdominal pain.  Since her last small bowel obstruction was in 2019 her last abdominal surgery was approximately 2018.  Last time she did not require any surgical intervention for SBO.  Currently she has no nausea or vomiting and her abdominal pain is feeling a bit better.  While in the emergency room she underwent a CT of the abdomen pelvis which revealed an ileus versus a partial small bowel obstruction within the proximal to mid ileal loops.  She does have a history of IBS with predominantly constipation takes MiraLAX daily.  She was transferred to [facility] for further monitoring and surgical evaluation.  **Interim History    Assessment & Plan:   Principal Problem:   Partial small bowel obstruction (HCC) Active Problems:   Palpitations   Hypertension   Hyperlipidemia   Anxiety   Irritable bowel syndrome   Asymptomatic bacteriuria   History of TIA (transient ischemic attack)   Thoracic aortic aneurysm (HCC)   Partial small bowel obstruction (HCC) vs ileus, improving  -Worsening abdominal pain PTA which is improved today and resolved. No  nausea nor vomiting.  -CT shows possible ileus versus partial SBO.History of multiple abdominal surgeries places her at increased risk -Continue conservative measures for now with close monitoring -Follow-up surgery consult and they recommended NG tube decompression to help resolve her partial small bowel obstruction versus ileus but patient refused and had one in the past and declines at this time; general surgery signed off the case now because the patient will decide to continue bowel rest but they feel like if she does not improve or worsens and would like NG surgery will reconsult and be glad to assist with management -No need for NG tube at this time unless develops nausea or vomiting -Keep magnesium above 2 and potassium above 4 -WBC is improved and went from 14.6 -> 13.2 -> 10.0 -Continued fluids with normal saline and changed to normal saline plus KCl 20 mEq at 75 mL's per hour from 100 MLS per hour and will reduce to 75 mL/hr for 12 more hours  -Kept NPO;sips and ice chips okay and now will advance Diet Slowly and is on a CLD and will go to FULL this Evening and Soft in AM; If having Bowel Movements and tolerating diet can discharge home in the AM  -Last BM approximately Saturday afternoon -Repeat KUB this morning as below   Asymptomatic bacteriuria -UA noted with small leukocyte esterase and few bacteria, 11-20 WBC -She is asymptomatic, therefore will hold off on any treatment at this time;leukocytosis noted however may be reactive also in setting of underlyingpSBO/ileus -WBC is trending down and went from 14.6 and now 13.2  Irritable bowel syndrome -Patient endorses predominant constipation. She  uses MiraLAX 17 daily at home. This may be contributing to her presenting symptoms and findings on CT -Follow-up surgery consult regarding above, but may benefit from more aggressive bowel regimen at home at discharge -Will start Senna-Docusate 1 tab BID and Miralax 17 g po BID    Constipation -KUB this AM showed "Formed stool throughout most of the colon without rectal impaction or obstructive pattern. No concerning mass effect or calcification." -No visible small bowel dilatation noted  -Aggressive Bowel Regimen as above  Thoracic aortic aneurysm [facility]) -Last cardiology note reviewed from 11/23/2019. Aneurysm is 40 mm on last echo in 2018 -She is followed closely by cardiology outpatient  History of TIA (transient ischemic attack) -Held home aspirin for now in case of need for surgery but surgery has currently signed off as she wants conservative measures;  -Resumed ASA 81 mg po Daily and Statin as Below   Anxiety -Held Wellbutrin until taking orals again; Now will resume Bupropion 300 mg po Daily   GERD -Resume PPI with Formulary Substitution with Pantoprazole 40 mg Daily  Hyperlipidemia -Resume Simvastatin 40 mg po Daily   Hypertension -On amlodipine, losartan, Toprol.Was NPO but now will resume; Continue to Hold Home HCTZ -C/w Amlodipine 2.5 mg po BID, Losartan 100 mg po Daily and Metoprolol Succinate 100 mg po BID -Used labetalol or hydralazine as needed while she is n.p.o.  Palpitations/PVCs -Well-controlled on Toprol at home. Follows with cardiology and was actually last evaluated on 11/23/2019 -Resume Metoprolol Succinate 100 mg po BID  Hyperbilirubinemia -Mild at 1.3 on admission and improved to 1.0 yesterday (0.3 Direct and 0.7 Indirect) but today is elevated again at 1.4 -Likely Reactive -Continue to Monitor and Trend and CMP in the AM   Hyperglycemia -Patient blood sugar on admission was 140 and repeat was 134 -Check hemoglobin A1c in the morning and continue monitor blood sugars carefully and if necessary will place on sensitive and will sign scale insulin AC  Obesity -Complicates overall care and prognosis -Estimated body mass index is 30.08 kg/m as calculated from the following:   Height as of this encounter: 5\' 4"   (1.626 m).   Weight as of this encounter: 79.5 kg. -Weight Loss and Dietary Counseling given    DVT prophylaxis: Enoxaparin 40 mg sq q24h Code Status: FULL CODE  Family Communication: No family present at bedside  Disposition Plan: Pending further clinical improvement and tolerance of Diet; Anticipate D/C home in the AM  Status is: Observation  The patient will require care spanning > 2 midnights and should be moved to inpatient because: Unsafe d/c plan, IV treatments appropriate due to intensity of illness or inability to take PO and Inpatient level of care appropriate due to severity of illness  Dispo: The patient is from: Home              Anticipated d/c is to: Home              Anticipated d/c date is: 1 day              Patient currently is not medically stable to d/c.  Consultants:   General Surgery    Procedures: None  Antimicrobials:  Anti-infectives (From admission, onward)   None        Subjective: Seen and examined at bedside and she is doing much better today.  He denied any nausea, vomiting, abdominal pain.  Felt well.  Ambulating okay.  Passing gas.  No chest pain, lightheadedness or dizziness.  No  other concerns or plans at this time.  Surprised that she still has a lot of stool burden.  No other concerns or complaints at this time and tolerating her clear liquid diet and will go to full liquid diet later this evening  Objective: Vitals:   11/27/19 0543 11/27/19 1310 11/27/19 2136 11/28/19 0557  BP: 137/76 (!) 105/59 136/72 121/62  Pulse: 63 63 (!) 58 64  Resp: 18 17 20 16   Temp: 98.6 F (37 C) 98.8 F (37.1 C) 98.3 F (36.8 C) 98.2 F (36.8 C)  TempSrc: Oral Oral Oral Oral  SpO2: 100% 96% 97% 96%  Weight:      Height:        Intake/Output Summary (Last 24 hours) at 11/28/2019 1046 Last data filed at 11/28/2019 0600 Gross per 24 hour  Intake 1397.45 ml  Output --  Net 1397.45 ml   Filed Weights   11/27/19 0145  Weight: 79.5 kg    Examination: Physical Exam:  Constitutional: WN/WD obese Caucasian female currently in NAD and appears calm and comfortable Eyes: Lids and conjunctivae normal, sclerae anicteric  ENMT: External Ears, Nose appear normal. Grossly normal hearing. Neck: Appears normal, supple, no cervical masses, normal ROM, no appreciable thyromegaly; no JVD Respiratory: Diminished to auscultation bilaterally, no wheezing, rales, rhonchi or crackles. Normal respiratory effort and patient is not tachypenic. No accessory muscle use.  Unlabored breathing Cardiovascular: RRR, no murmurs / rubs / gallops. S1 and S2 auscultated.  Abdomen: Soft, non-tender, distended secondary body habitus.  Bowel sounds positive.  GU: Deferred. Musculoskeletal: No clubbing / cyanosis of digits/nails. No joint deformity upper and lower extremities.  Skin: No rashes, lesions, ulcers or limited skin evaluation. No induration; Warm and dry.  Neurologic: CN 2-12 grossly intact with no focal deficits. Romberg sign and cerebellar reflexes not assessed.  Psychiatric: Normal judgment and insight. Alert and oriented x 3. Normal mood and appropriate affect.   Data Reviewed: I have personally reviewed following labs and imaging studies  CBC: Recent Labs  Lab 11/27/19 0149 11/27/19 0647 11/28/19 0451  WBC 14.6* 13.2* 10.0  NEUTROABS 11.3*  --  5.7  HGB 14.5 13.6 13.9  HCT 41.6 39.4 41.7  MCV 93.9 96.6 98.6  PLT 243 241 818   Basic Metabolic Panel: Recent Labs  Lab 11/27/19 0149 11/27/19 0647 11/28/19 0451  NA 138 138 140  K 3.8 4.3 4.0  CL 100 102 107  CO2 27 27 24   GLUCOSE 140* 134* 84  BUN 24* 21 11  CREATININE 0.82 0.78 0.81  CALCIUM 9.0 8.8* 9.0  MG  --  2.1 2.1  PHOS  --   --  3.2   GFR: Estimated Creatinine Clearance: 68.7 mL/min (by C-G formula based on SCr of 0.81 mg/dL). Liver Function Tests: Recent Labs  Lab 11/27/19 0149 11/27/19 1030 11/28/19 0451  AST 27 24 27   ALT 24 23 21   ALKPHOS 59 53 51   BILITOT 1.3* 1.0 1.4*  PROT 6.8 6.0* 6.0*  ALBUMIN 4.2 3.9 3.6   Recent Labs  Lab 11/27/19 0149  LIPASE 27   No results for input(s): AMMONIA in the last 168 hours. Coagulation Profile: No results for input(s): INR, PROTIME in the last 168 hours. Cardiac Enzymes: No results for input(s): CKTOTAL, CKMB, CKMBINDEX, TROPONINI in the last 168 hours. BNP (last 3 results) No results for input(s): PROBNP in the last 8760 hours. HbA1C: No results for input(s): HGBA1C in the last 72 hours. CBG: No results for input(s): GLUCAP in  the last 168 hours. Lipid Profile: No results for input(s): CHOL, HDL, LDLCALC, TRIG, CHOLHDL, LDLDIRECT in the last 72 hours. Thyroid Function Tests: No results for input(s): TSH, T4TOTAL, FREET4, T3FREE, THYROIDAB in the last 72 hours. Anemia Panel: No results for input(s): VITAMINB12, FOLATE, FERRITIN, TIBC, IRON, RETICCTPCT in the last 72 hours. Sepsis Labs: No results for input(s): PROCALCITON, LATICACIDVEN in the last 168 hours.  Recent Results (from the past 240 hour(s))  Respiratory Panel by RT PCR (Flu A&B, Covid) - Nasopharyngeal Swab     Status: None   Collection Time: 11/27/19  3:39 AM   Specimen: Nasopharyngeal Swab  Result Value Ref Range Status   SARS Coronavirus 2 by RT PCR NEGATIVE NEGATIVE Final    Comment: (NOTE) SARS-CoV-2 target nucleic acids are NOT DETECTED.  The SARS-CoV-2 RNA is generally detectable in upper respiratoy specimens during the acute phase of infection. The lowest concentration of SARS-CoV-2 viral copies this assay can detect is 131 copies/mL. A negative result does not preclude SARS-Cov-2 infection and should not be used as the sole basis for treatment or other patient management decisions. A negative result may occur with  improper specimen collection/handling, submission of specimen other than nasopharyngeal swab, presence of viral mutation(s) within the areas targeted by this assay, and inadequate number of viral  copies (<131 copies/mL). A negative result must be combined with clinical observations, patient history, and epidemiological information. The expected result is Negative.  Fact Sheet for Patients:  PinkCheek.be  Fact Sheet for Healthcare Providers:  GravelBags.it  This test is no t yet approved or cleared by the [place] FDA and  has been authorized for detection and/or diagnosis of SARS-CoV-2 by FDA under an Emergency Use Authorization (EUA). This EUA will remain  in effect (meaning this test can be used) for the duration of the COVID-19 declaration under Section 564(b)(1) of the Act, 21 U.S.C. section 360bbb-3(b)(1), unless the authorization is terminated or revoked sooner.     Influenza A by PCR NEGATIVE NEGATIVE Final   Influenza B by PCR NEGATIVE NEGATIVE Final    Comment: (NOTE) The Xpert Xpress SARS-CoV-2/FLU/RSV assay is intended as an aid in  the diagnosis of influenza from Nasopharyngeal swab specimens and  should not be used as a sole basis for treatment. Nasal washings and  aspirates are unacceptable for Xpert Xpress SARS-CoV-2/FLU/RSV  testing.  Fact Sheet for Patients: PinkCheek.be  Fact Sheet for Healthcare Providers: GravelBags.it  This test is not yet approved or cleared by the [place] FDA and  has been authorized for detection and/or diagnosis of SARS-CoV-2 by  FDA under an Emergency Use Authorization (EUA). This EUA will remain  in effect (meaning this test can be used) for the duration of the  Covid-19 declaration under Section 564(b)(1) of the Act, 21  U.S.C. section 360bbb-3(b)(1), unless the authorization is  terminated or revoked. Performed at [facility], [address] [place], [place] [address]     RN Pressure Injury Documentation:     Estimated body mass index is 30.08 kg/m as calculated from the  following:   Height as of this encounter: 5\' 4"  (1.626 m).   Weight as of this encounter: 79.5 kg.  Malnutrition Type:    Malnutrition Characteristics:    Nutrition Interventions:    Radiology Studies: DG Abd 1 View  Result Date: 11/28/2019 CLINICAL DATA:  Abdominal pain.  Anticipated discharge EXAM: ABDOMEN - 1 VIEW COMPARISON:  Which yesterday FINDINGS: Formed stool throughout most of the  colon without rectal impaction or obstructive pattern. No concerning mass effect or calcification. IMPRESSION: 1. No visible small bowel dilatation. 2. Generalized colonic stool. Electronically Signed   By: Monte Fantasia M.D.   On: 11/28/2019 05:28   DG Abd 1 View  Result Date: 11/27/2019 CLINICAL DATA:  Small-bowel obstruction. EXAM: ABDOMEN - 1 VIEW COMPARISON:  CT abdomen from earlier same day. FINDINGS: The mildly prominent fluid-filled loops of small bowel seen on today's earlier CT are not able to be visualized by plain film. Moderate amount of stool and gas within the nondistended colon. No evidence of abnormal fluid collection. No evidence of renal or ureteral calculi. Linear foreign body overlying the lower pelvis was not present on today's earlier CT, of uncertain etiology or function. IMPRESSION: 1. The mildly prominent fluid-filled loops of small bowel seen on today's earlier CT, suggesting partial small bowel obstruction versus ileus caused by underlying enteritis, are not able to be visualized by plain film. 2. Linear foreign body overlying the lower pelvis/bladder, not present on today's earlier CT, of uncertain etiology or function. Clinical correlation recommended. Electronically Signed   By: Franki Cabot M.D.   On: 11/27/2019 11:36   CT ABDOMEN PELVIS W CONTRAST  Result Date: 11/27/2019 CLINICAL DATA:  Diffuse abdominal pain EXAM: CT ABDOMEN AND PELVIS WITH CONTRAST TECHNIQUE: Multidetector CT imaging of the abdomen and pelvis was performed using the standard protocol following  bolus administration of intravenous contrast. CONTRAST:  140mL OMNIPAQUE IOHEXOL 300 MG/ML  SOLN COMPARISON:  April 01, 2019 FINDINGS: Lower chest: The visualized heart size within normal limits. No pericardial fluid/thickening. No hiatal hernia. The visualized portions of the lungs are clear. Hepatobiliary: Multiple low-density lesions are seen throughout the liver parenchyma the largest within the superior right hepatic dome measuring 2 cm. These are likely hepatic cysts. No evidence of calcified gallstones, gallbladder wall thickening or biliary dilatation. Pancreas: Unremarkable. No pancreatic ductal dilatation or surrounding inflammatory changes. Spleen: Normal in size without focal abnormality. Adrenals/Urinary Tract: Both adrenal glands appear normal. The kidneys and collecting system appear normal without evidence of urinary tract calculus or hydronephrosis. Bladder is unremarkable. Stomach/Bowel: The stomach is normal in appearance. There is mildly dilated air and fluid-filled loops of small bowel with a gradual area of narrowing seen within the [place] Vascular/Lymphatic: There are no enlarged mesenteric, retroperitoneal, or pelvic lymph nodes. No significant vascular findings are present. Reproductive: Other: No evidence of abdominal wall mass or hernia. Musculoskeletal: No acute or significant osseous findings. There is a grade 1 anterolisthesis of L4 on L5 measuring 4 mm. IMPRESSION: Findings suggestive of ileus versus partial small obstruction within the proximal to mid ileal loops. There is fecalization of the terminal ileum which could be due to true slow transit. Electronically Signed   By: Prudencio Pair M.D.   On: 11/27/2019 03:20   Scheduled Meds: . enoxaparin (LOVENOX) injection  40 mg Subcutaneous Daily  . influenza vaccine adjuvanted  0.5 mL Intramuscular Tomorrow-1000  . polyethylene glycol  17 g Oral BID  . senna-docusate  1 tablet Oral BID  . sodium chloride flush  3 mL Intravenous  Q12H   Continuous Infusions: . 0.9 % NaCl with KCl 20 mEq / L 75 mL/hr at 11/28/19 0005    LOS: 0 days   Kerney Elbe, DO [facility] PAGER is on [place]  If 7PM-7AM, please contact night-coverage [email or web address]

## 2019-11-28 NOTE — Progress Notes (Signed)
Initial Nutrition Assessment  DOCUMENTATION CODES:   Not applicable  INTERVENTION:  - will order 30 ml Prosource Plus BID, each supplement provides 100 kcal and 15 grams protein.  - continue diet advancement as medically feasible.   NUTRITION DIAGNOSIS:   Inadequate oral intake related to acute illness, other (see comment) (current diet order) as evidenced by other (comment) (CLD advanced to FLD after lunch).  GOAL:   Patient will meet greater than or equal to 90% of their needs  MONITOR:   PO intake, Supplement acceptance, Diet advancement, Labs, Weight trends  REASON FOR ASSESSMENT:   Consult Assessment of nutrition requirement/status, Poor PO  ASSESSMENT:   69 year-old female with medical history of SBOs, multiple abdominal surgeries, hiatal hernia, arthritis, depression, DVT, TIA, HTN, HLD, IBS (mainly constipation), thoracic aortic aneurysm, and palpitations. She presented to [facility] with worsening abdominal pain. She reported having some degree of chronic abdominal pain d/t history of abdominal surgeries and hernia repairs but that the pain had worsened on 10/23. She had 2-3 bowel movements 10/23 morning. Patient reported to ED staff that her last SBO was in 2019 and her last abdominal surgery was in 2018. No N/V PTA.CT abdomen/pelvis showed ileus vs partial SBO.  Diet advanced from NPO to CLD today at 0823 and to [place] at 1511. No intakes documented. Unable to talk with patient at this time. MST score of 0. Weight yesterday was 175 lb and weight on 11/23/19 was 164 lb. Weight on 11/23/19 was more consistent with weights as far back as 03/12/16. No information documented in edema section of flow sheet.   She has not had NGT at any point since presentation to the ED.   Per notes: - partial SBO vs ileus--improving with no N/V - bacteriuria - hx of IBS with plan for aggressive bowel regimen to be ordered for after d/c - from home with plan for return home at the  time of d/c   Labs reviewed. Medications reviewed; 2000 mg ascorbic acid BID, 2000 units cholecalciferol/day, 1 tablet multivitamin with minerals/day, 17 g miralax BID, 1 tablet senokot BID. IVF; NS-20 mEq IV KCl @ 75 ml/hr.     NUTRITION - FOCUSED PHYSICAL EXAM:  unable to complete at this time.   Diet Order:   Diet Order            Diet full liquid Room service appropriate? Yes; Fluid consistency: Thin  Diet effective now                 EDUCATION NEEDS:   No education needs have been identified at this time  Skin:  Skin Assessment: Reviewed RN Assessment  Last BM:  PTA/unknown  Height:   Ht Readings from Last 1 Encounters:  11/27/19 5\' 4"  (1.626 m)    Weight:   Wt Readings from Last 1 Encounters:  11/27/19 79.5 kg     Estimated Nutritional Needs:  Kcal:  1500-1700 kcal Protein:  70-80 grams Fluid:  >/= 2 L/day     Jarome Matin, MS, RD, LDN, CNSC Inpatient Clinical Dietitian RD pager # available in AMION  After hours/weekend pager # available in [facility]

## 2019-11-28 NOTE — Plan of Care (Signed)
  Problem: Education: Goal: Knowledge of General Education information will improve Description: Including pain rating scale, medication(s)/side effects and non-pharmacologic comfort measures Outcome: Progressing   Problem: Health Behavior/Discharge Planning: Goal: Ability to manage health-related needs will improve Outcome: Progressing   Problem: Clinical Measurements: Goal: Ability to maintain clinical measurements within normal limits will improve Outcome: Progressing Goal: Will remain free from infection Outcome: Progressing Goal: Diagnostic test results will improve Outcome: Progressing   Problem: Education: Goal: Knowledge of General Education information will improve Description: Including pain rating scale, medication(s)/side effects and non-pharmacologic comfort measures Outcome: Progressing   Problem: Health Behavior/Discharge Planning: Goal: Ability to manage health-related needs will improve Outcome: Progressing   Problem: Clinical Measurements: Goal: Ability to maintain clinical measurements within normal limits will improve Outcome: Progressing Goal: Will remain free from infection Outcome: Progressing Goal: Diagnostic test results will improve Outcome: Progressing   

## 2019-11-29 ENCOUNTER — Observation Stay (HOSPITAL_COMMUNITY): Payer: Medicare PPO

## 2019-11-29 DIAGNOSIS — M1990 Unspecified osteoarthritis, unspecified site: Secondary | ICD-10-CM | POA: Diagnosis present

## 2019-11-29 DIAGNOSIS — R17 Unspecified jaundice: Secondary | ICD-10-CM | POA: Diagnosis present

## 2019-11-29 DIAGNOSIS — R739 Hyperglycemia, unspecified: Secondary | ICD-10-CM | POA: Diagnosis present

## 2019-11-29 DIAGNOSIS — K5900 Constipation, unspecified: Secondary | ICD-10-CM

## 2019-11-29 DIAGNOSIS — K219 Gastro-esophageal reflux disease without esophagitis: Secondary | ICD-10-CM | POA: Diagnosis present

## 2019-11-29 DIAGNOSIS — E669 Obesity, unspecified: Secondary | ICD-10-CM | POA: Diagnosis present

## 2019-11-29 DIAGNOSIS — Z8673 Personal history of transient ischemic attack (TIA), and cerebral infarction without residual deficits: Secondary | ICD-10-CM | POA: Diagnosis not present

## 2019-11-29 DIAGNOSIS — F419 Anxiety disorder, unspecified: Secondary | ICD-10-CM | POA: Diagnosis not present

## 2019-11-29 DIAGNOSIS — Z87891 Personal history of nicotine dependence: Secondary | ICD-10-CM | POA: Diagnosis not present

## 2019-11-29 DIAGNOSIS — K56609 Unspecified intestinal obstruction, unspecified as to partial versus complete obstruction: Secondary | ICD-10-CM

## 2019-11-29 DIAGNOSIS — K589 Irritable bowel syndrome without diarrhea: Secondary | ICD-10-CM | POA: Diagnosis present

## 2019-11-29 DIAGNOSIS — Z8614 Personal history of Methicillin resistant Staphylococcus aureus infection: Secondary | ICD-10-CM | POA: Diagnosis not present

## 2019-11-29 DIAGNOSIS — J302 Other seasonal allergic rhinitis: Secondary | ICD-10-CM | POA: Diagnosis present

## 2019-11-29 DIAGNOSIS — Z20822 Contact with and (suspected) exposure to covid-19: Secondary | ICD-10-CM | POA: Diagnosis present

## 2019-11-29 DIAGNOSIS — R8271 Bacteriuria: Secondary | ICD-10-CM | POA: Diagnosis not present

## 2019-11-29 DIAGNOSIS — N3010 Interstitial cystitis (chronic) without hematuria: Secondary | ICD-10-CM | POA: Diagnosis present

## 2019-11-29 DIAGNOSIS — F32A Depression, unspecified: Secondary | ICD-10-CM | POA: Diagnosis present

## 2019-11-29 DIAGNOSIS — K56600 Partial intestinal obstruction, unspecified as to cause: Secondary | ICD-10-CM | POA: Diagnosis present

## 2019-11-29 DIAGNOSIS — Z23 Encounter for immunization: Secondary | ICD-10-CM | POA: Diagnosis present

## 2019-11-29 DIAGNOSIS — F40240 Claustrophobia: Secondary | ICD-10-CM | POA: Diagnosis present

## 2019-11-29 DIAGNOSIS — R002 Palpitations: Secondary | ICD-10-CM | POA: Diagnosis present

## 2019-11-29 DIAGNOSIS — I10 Essential (primary) hypertension: Secondary | ICD-10-CM | POA: Diagnosis present

## 2019-11-29 DIAGNOSIS — G4700 Insomnia, unspecified: Secondary | ICD-10-CM | POA: Diagnosis present

## 2019-11-29 DIAGNOSIS — R682 Dry mouth, unspecified: Secondary | ICD-10-CM | POA: Diagnosis present

## 2019-11-29 DIAGNOSIS — I493 Ventricular premature depolarization: Secondary | ICD-10-CM | POA: Diagnosis present

## 2019-11-29 DIAGNOSIS — J45909 Unspecified asthma, uncomplicated: Secondary | ICD-10-CM | POA: Diagnosis present

## 2019-11-29 DIAGNOSIS — E785 Hyperlipidemia, unspecified: Secondary | ICD-10-CM | POA: Diagnosis present

## 2019-11-29 DIAGNOSIS — I712 Thoracic aortic aneurysm, without rupture: Secondary | ICD-10-CM | POA: Diagnosis present

## 2019-11-29 LAB — COMPREHENSIVE METABOLIC PANEL
ALT: 22 U/L (ref 0–44)
AST: 23 U/L (ref 15–41)
Albumin: 3.7 g/dL (ref 3.5–5.0)
Alkaline Phosphatase: 51 U/L (ref 38–126)
Anion gap: 10 (ref 5–15)
BUN: 9 mg/dL (ref 8–23)
CO2: 27 mmol/L (ref 22–32)
Calcium: 9.2 mg/dL (ref 8.9–10.3)
Chloride: 104 mmol/L (ref 98–111)
Creatinine, Ser: 0.79 mg/dL (ref 0.44–1.00)
GFR, Estimated: >60 mL/min (ref >60)
Glucose, Bld: 87 mg/dL (ref 70–99)
Potassium: 3.8 mmol/L (ref 3.5–5.1)
Sodium: 141 mmol/L (ref 135–145)
Total Bilirubin: 1.5 mg/dL — ABNORMAL HIGH (ref 0.3–1.2)
Total Protein: 5.9 g/dL — ABNORMAL LOW (ref 6.5–8.1)

## 2019-11-29 LAB — CBC WITH DIFFERENTIAL/PLATELET
Abs Immature Granulocytes: 0.02 10*3/uL (ref 0.00–0.07)
Basophils Absolute: 0.1 10*3/uL (ref 0.0–0.1)
Basophils Relative: 1 %
Eosinophils Absolute: 0.5 10*3/uL (ref 0.0–0.5)
Eosinophils Relative: 6 %
HCT: 36.6 % (ref 36.0–46.0)
Hemoglobin: 12.6 g/dL (ref 12.0–15.0)
Immature Granulocytes: 0 %
Lymphocytes Relative: 25 %
Lymphs Abs: 2 10*3/uL (ref 0.7–4.0)
MCH: 33.3 pg (ref 26.0–34.0)
MCHC: 34.4 g/dL (ref 30.0–36.0)
MCV: 96.8 fL (ref 80.0–100.0)
Monocytes Absolute: 1.1 10*3/uL — ABNORMAL HIGH (ref 0.1–1.0)
Monocytes Relative: 13 %
Neutro Abs: 4.3 10*3/uL (ref 1.7–7.7)
Neutrophils Relative %: 55 %
Platelets: 212 10*3/uL (ref 150–400)
RBC: 3.78 MIL/uL — ABNORMAL LOW (ref 3.87–5.11)
RDW: 12.3 % (ref 11.5–15.5)
WBC: 7.9 10*3/uL (ref 4.0–10.5)
nRBC: 0 % (ref 0.0–0.2)

## 2019-11-29 LAB — MAGNESIUM: Magnesium: 2.1 mg/dL (ref 1.7–2.4)

## 2019-11-29 LAB — PHOSPHORUS: Phosphorus: 3.6 mg/dL (ref 2.5–4.6)

## 2019-11-29 MED ORDER — SORBITOL 70 % SOLN
960.0000 mL | TOPICAL_OIL | Freq: Once | ORAL | Status: AC
  Administered 2019-11-29: 960 mL via RECTAL
  Filled 2019-11-29: qty 473

## 2019-11-29 MED ORDER — ACETAMINOPHEN 325 MG PO TABS
650.0000 mg | ORAL_TABLET | Freq: Four times a day (QID) | ORAL | Status: DC | PRN
  Administered 2019-11-29: 650 mg via ORAL
  Filled 2019-11-29: qty 2

## 2019-11-29 NOTE — Progress Notes (Signed)
PROGRESS NOTE    Cynthia Peters  OBS:[id number] DOB: 1950/12/27 DOA: 11/27/2019 PCP: Aletha Halim., PA-C   Brief Narrative:  The patient is an overweight Caucasian female with a past medical history of multiple small bowel obstructions, history of multiple abdominal surgeries with 4 masses, hiatal hernia, arthritis, depression, history of DVT, history of TIA, hypertension, hyperlipidemia, irritable bowel syndrome as well as thoracic aortic aneurysm and palpitations and PVCs who presented to med center [facility] with worsening abdominal pain.  She typically has some chronic abdominal pain at baseline secondary to her abdominal surgeries and hernia repairs with pain recently worsened early on Saturday and states that she had 2-3 bowel movements last evening as having some flatus prior to going to [facility] but continues have some significant abdominal pain.  Since her last small bowel obstruction was in 2019 her last abdominal surgery was approximately 2018.  Last time she did not require any surgical intervention for SBO.  Currently she has no nausea or vomiting and her abdominal pain is feeling a bit better.  While in the emergency room she underwent a CT of the abdomen pelvis which revealed an ileus versus a partial small bowel obstruction within the proximal to mid ileal loops.  She does have a history of IBS with predominantly constipation takes MiraLAX daily.  She was transferred to [facility] for further monitoring and surgical evaluation.  **Interim History  The patient started improving on conservative measures and diet was advanced and she was advanced to full liquid diet but started having some crampy abdominal pain early this morning.  Diet was advanced to soft diet however she has not tolerated this yet and felt queasy.  Repeat KUB showed moderate stool burden but no evidence of any obstruction so she was given an enema and caused more cramping abdominal pain.  She passed a little bit of  blood then.  We will continue to monitor her closely overnight and if she is able to tolerate she can likely be discharged in the morning  Assessment & Plan:   Principal Problem:   Partial small bowel obstruction (HCC) Active Problems:   Palpitations   Hypertension   Hyperlipidemia   Anxiety   Irritable bowel syndrome   SBO (small bowel obstruction) (HCC)   Asymptomatic bacteriuria   History of TIA (transient ischemic attack)   Thoracic aortic aneurysm (HCC)   Partial small bowel obstruction (HCC) vs ileus, improving slowly -Worsening abdominal pain PTA which is improved but started having some cramping and minimal pain early this morning. No nausea nor vomiting currently.  -CT shows possible ileus versus partial SBO.History of multiple abdominal surgeries places her at increased risk -Continue conservative measures for now with close monitoring -Follow-up surgery consult and they recommended NG tube decompression to help resolve her partial small bowel obstruction versus ileus but patient refused and had one in the past and declines at this time; general surgery signed off the case now because the patient will decide to continue bowel rest but they feel like if she does not improve or worsens and would like NG surgery will reconsult and be glad to assist with management -No need for NG tube at this time unless develops nausea or vomiting -Keep magnesium above 2 and potassium above 4 -WBC is improved and went from 14.6 -> 13.2 -> 10.0 and now has normalized to 7.9 -IV fluid hydration is now stopped -Diet was advanced to soft diet today however she did not tolerate it as she  felt queasy was not attempted to eat a soft diet so we will watch her overnight. -Last BM approximately Saturday afternoon -Repeat KUB this morning as below   Asymptomatic bacteriuria -UA noted with small leukocyte esterase and few bacteria, 11-20 WBC -She is asymptomatic, therefore will hold off on any  treatment at this time;leukocytosis noted however may be reactive also in setting of underlyingpSBO/ileus -WBC is trending down and went from 14.6 and now 13.2 and is further improved is now normal  Irritable bowel syndrome -Patient endorses predominant constipation. She uses MiraLAX 17 daily at home. This may be contributing to her presenting symptoms and findings on CT -Follow-up surgery consult regarding above, but may benefit from more aggressive bowel regimen at home at discharge -Will start Senna-Docusate 1 tab BID and Miralax 17 g po BID and given enema and cause more crampy abdominal pain -Patient has a small bleeding today after her bowel movement and probably irritation from the enema.  -Will need to continue to monitor carefully  Constipation -KUB this AM showed "Moderate stool currently in colon. No bowel dilatation or air-fluid level to suggest bowel obstruction. No free air. Tiny foci of probable vascular calcification in the pelvis noted." -No visible small bowel dilatation noted  -Aggressive Bowel Regimen as above  Thoracic aortic aneurysm [facility]) -Last cardiology note reviewed from 11/23/2019. Aneurysm is 40 mm on last echo in 2018 -She is followed closely by cardiology outpatient  History of TIA (transient ischemic attack) -Held home aspirin for now in case of need for surgery but surgery has currently signed off as she wants conservative measures;  -Resumed ASA 81 mg po Daily and Statin as Below   Anxiety -Held Wellbutrin until taking orals again; Now will resume Bupropion 300 mg po Daily   GERD -Resume PPI with Formulary Substitution with Pantoprazole 40 mg Daily  Hyperlipidemia -Resume Simvastatin 40 mg po Daily   Hypertension -On amlodipine, losartan, Toprol.Was NPO but now will resume; Continue to Hold Home HCTZ -C/w Amlodipine 2.5 mg po BID, Losartan 100 mg po Daily and Metoprolol Succinate 100 mg po BID -Used labetalol or hydralazine as needed  while she is n.p.o.  Palpitations/PVCs -Well-controlled on Toprol at home. Follows with cardiology and was actually last evaluated on 11/23/2019 -Resume Metoprolol Succinate 100 mg po BID  Hyperbilirubinemia -Mild at 1.3 on admission and improved to 1.0 (0.3 Direct and 0.7 Indirect) but yesterday is elevated again at 1.4 yesterday and today is 1.5 -Likely Reactive -Continue to Monitor and Trend and CMP in the AM   Hyperglycemia -Patient blood sugar on admission was 140 and repeat was 134 and subsequently was 84 and 87 -Check hemoglobin A1c in the morning and continue monitor blood sugars carefully and if necessary will place on sensitive and will sign scale insulin AC  Obesity -Complicates overall care and prognosis -Estimated body mass index is 30.08 kg/m as calculated from the following:   Height as of this encounter: 5\' 4"  (1.626 m).   Weight as of this encounter: 79.5 kg. -Weight Loss and Dietary Counseling given    DVT prophylaxis: Enoxaparin 40 mg sq q24h Code Status: FULL CODE  Family Communication: Discussed with Son at Bedside  Disposition Plan: Pending further clinical improvement and tolerance of Diet; Anticipate D/C home in the AM if tolerating a soft Diet  Status is: Inpatient   The patient will require care spanning > 2 midnights and should be moved to inpatient because: Unsafe d/c plan, IV treatments appropriate due to intensity of  illness or inability to take PO and Inpatient level of care appropriate due to severity of illness  Dispo: The patient is from: Home              Anticipated d/c is to: Home              Anticipated d/c date is: 1 day              Patient currently is not medically stable to d/c.  Consultants:   General Surgery    Procedures: None  Antimicrobials:  Anti-infectives (From admission, onward)   Start     Dose/Rate Route Frequency Ordered Stop   11/28/19 1200  valACYclovir (VALTREX) tablet 500 mg        500 mg Oral Daily  11/28/19 1051          Subjective: Seen and examined at bedside and was having a little bit of abdominal pain this morning but nothing like what she did when she came in.  Diet was advanced to soft but then she started feeling queasy and received an enema and did not feel hungry and did not eat yet.  After the enema she started having several bowel movements and the last bowel movement had a little bit of blood in it.  To have some crampy abdominal pain and will observe again overnight given that she is not tolerating her soft diet.  We will repeat a KUB in the morning and continue laxatives and if not improving may consider GI consultation and rediscussion with general surgery.  Objective: Vitals:   11/28/19 0557 11/28/19 1259 11/28/19 2024 11/29/19 0648  BP: 121/62 (!) 125/92 122/77 128/71  Pulse: 64 67 64 61  Resp: 16 17 20 18   Temp: 98.2 F (36.8 C) 98.6 F (37 C) 98.2 F (36.8 C) 98.1 F (36.7 C)  TempSrc: Oral Oral Oral   SpO2: 96% 94% 97% 98%  Weight:      Height:        Intake/Output Summary (Last 24 hours) at 11/29/2019 1649 Last data filed at 11/29/2019 1000 Gross per 24 hour  Intake 240 ml  Output -  Net 240 ml   Filed Weights   11/27/19 0145  Weight: 79.5 kg   Examination: Physical Exam:  Constitutional: WN/WD obese Caucasian female currently no acute distress appears calm and slightly uncomfortable Eyes: Lids and conjunctivae normal, sclerae anicteric  ENMT: External Ears, Nose appear normal. Grossly normal hearing.  Neck: Appears normal, supple, no cervical masses, normal ROM, no appreciable thyromegaly: No JVD Respiratory: Diminished to auscultation bilaterally, no wheezing, rales, rhonchi or crackles. Normal respiratory effort and patient is not tachypenic. No accessory muscle use.  Cardiovascular: RRR, no murmurs / rubs / gallops. S1 and S2 auscultated.  Abdomen: Soft, minimally-tender, distended secondary body habitus. Bowel sounds positive.  GU:  Deferred. Musculoskeletal: No clubbing / cyanosis of digits/nails. No joint deformity upper and lower extremities.  Skin: No rashes, lesions, ulcers on limited skin evaluation. No induration; Warm and dry.  Neurologic: CN 2-12 grossly intact with no focal deficits. Romberg sign cerebellar reflexes not assessed.  Psychiatric: Normal judgment and insight. Alert and oriented x 3. Normal mood and appropriate affect.   Data Reviewed: I have personally reviewed following labs and imaging studies  CBC: Recent Labs  Lab 11/27/19 0149 11/27/19 0647 11/28/19 0451 11/29/19 0421  WBC 14.6* 13.2* 10.0 7.9  NEUTROABS 11.3*  --  5.7 4.3  HGB 14.5 13.6 13.9 12.6  HCT 41.6 39.4  41.7 36.6  MCV 93.9 96.6 98.6 96.8  PLT 243 241 246 564   Basic Metabolic Panel: Recent Labs  Lab 11/27/19 0149 11/27/19 0647 11/28/19 0451 11/29/19 0421  NA 138 138 140 141  K 3.8 4.3 4.0 3.8  CL 100 102 107 104  CO2 27 27 24 27   GLUCOSE 140* 134* 84 87  BUN 24* 21 11 9   CREATININE 0.82 0.78 0.81 0.79  CALCIUM 9.0 8.8* 9.0 9.2  MG  --  2.1 2.1 2.1  PHOS  --   --  3.2 3.6   GFR: Estimated Creatinine Clearance: 69.6 mL/min (by C-G formula based on SCr of 0.79 mg/dL). Liver Function Tests: Recent Labs  Lab 11/27/19 0149 11/27/19 1030 11/28/19 0451 11/29/19 0421  AST 27 24 27 23   ALT 24 23 21 22   ALKPHOS 59 53 51 51  BILITOT 1.3* 1.0 1.4* 1.5*  PROT 6.8 6.0* 6.0* 5.9*  ALBUMIN 4.2 3.9 3.6 3.7   Recent Labs  Lab 11/27/19 0149  LIPASE 27   No results for input(s): AMMONIA in the last 168 hours. Coagulation Profile: No results for input(s): INR, PROTIME in the last 168 hours. Cardiac Enzymes: No results for input(s): CKTOTAL, CKMB, CKMBINDEX, TROPONINI in the last 168 hours. BNP (last 3 results) No results for input(s): PROBNP in the last 8760 hours. HbA1C: No results for input(s): HGBA1C in the last 72 hours. CBG: No results for input(s): GLUCAP in the last 168 hours. Lipid Profile: No  results for input(s): CHOL, HDL, LDLCALC, TRIG, CHOLHDL, LDLDIRECT in the last 72 hours. Thyroid Function Tests: No results for input(s): TSH, T4TOTAL, FREET4, T3FREE, THYROIDAB in the last 72 hours. Anemia Panel: No results for input(s): VITAMINB12, FOLATE, FERRITIN, TIBC, IRON, RETICCTPCT in the last 72 hours. Sepsis Labs: No results for input(s): PROCALCITON, LATICACIDVEN in the last 168 hours.  Recent Results (from the past 240 hour(s))  Respiratory Panel by RT PCR (Flu A&B, Covid) - Nasopharyngeal Swab     Status: None   Collection Time: 11/27/19  3:39 AM   Specimen: Nasopharyngeal Swab  Result Value Ref Range Status   SARS Coronavirus 2 by RT PCR NEGATIVE NEGATIVE Final    Comment: (NOTE) SARS-CoV-2 target nucleic acids are NOT DETECTED.  The SARS-CoV-2 RNA is generally detectable in upper respiratoy specimens during the acute phase of infection. The lowest concentration of SARS-CoV-2 viral copies this assay can detect is 131 copies/mL. A negative result does not preclude SARS-Cov-2 infection and should not be used as the sole basis for treatment or other patient management decisions. A negative result may occur with  improper specimen collection/handling, submission of specimen other than nasopharyngeal swab, presence of viral mutation(s) within the areas targeted by this assay, and inadequate number of viral copies (<131 copies/mL). A negative result must be combined with clinical observations, patient history, and epidemiological information. The expected result is Negative.  Fact Sheet for Patients:  PinkCheek.be  Fact Sheet for Healthcare Providers:  GravelBags.it  This test is no t yet approved or cleared by the [place] FDA and  has been authorized for detection and/or diagnosis of SARS-CoV-2 by FDA under an Emergency Use Authorization (EUA). This EUA will remain  in effect (meaning this test can be  used) for the duration of the COVID-19 declaration under Section 564(b)(1) of the Act, 21 U.S.C. section 360bbb-3(b)(1), unless the authorization is terminated or revoked sooner.     Influenza A by PCR NEGATIVE NEGATIVE Final   Influenza B by PCR  NEGATIVE NEGATIVE Final    Comment: (NOTE) The Xpert Xpress SARS-CoV-2/FLU/RSV assay is intended as an aid in  the diagnosis of influenza from Nasopharyngeal swab specimens and  should not be used as a sole basis for treatment. Nasal washings and  aspirates are unacceptable for Xpert Xpress SARS-CoV-2/FLU/RSV  testing.  Fact Sheet for Patients: PinkCheek.be  Fact Sheet for Healthcare Providers: GravelBags.it  This test is not yet approved or cleared by the [place] FDA and  has been authorized for detection and/or diagnosis of SARS-CoV-2 by  FDA under an Emergency Use Authorization (EUA). This EUA will remain  in effect (meaning this test can be used) for the duration of the  Covid-19 declaration under Section 564(b)(1) of the Act, 21  U.S.C. section 360bbb-3(b)(1), unless the authorization is  terminated or revoked. Performed at [facility], [address] [place], [place] [address]     RN Pressure Injury Documentation:     Estimated body mass index is 30.08 kg/m as calculated from the following:   Height as of this encounter: 5\' 4"  (1.626 m).   Weight as of this encounter: 79.5 kg.  Malnutrition Type:  Nutrition Problem: Inadequate oral intake Etiology: acute illness, other (see comment) (current diet order) Malnutrition Characteristics:  Signs/Symptoms: other (comment) (CLD advanced to FLD after lunch) Nutrition Interventions:  Interventions: MVI, Other (Comment) (Prosource Plus BID) Radiology Studies: DG Abd 1 View  Result Date: 11/29/2019 CLINICAL DATA:  Reported small-bowel obstruction EXAM: ABDOMEN - 1 VIEW COMPARISON:  Abdominal radiograph  November 28, 2019; CT abdomen and pelvis November 27, 2019 FINDINGS: Moderate stool currently in colon. No bowel dilatation or air-fluid level to suggest bowel obstruction. No free air. Tiny foci of probable vascular calcification in the pelvis noted. IMPRESSION: No bowel obstruction or free air evident currently. Moderate stool in colon. Electronically Signed   By: Lowella Grip III M.D.   On: 11/29/2019 11:13   DG Abd 1 View  Result Date: 11/28/2019 CLINICAL DATA:  Recent small bowel obstruction EXAM: ABDOMEN - 1 VIEW COMPARISON:  November 28, 2019 study obtained earlier in the day; abdominal radiograph and CT abdomen and pelvis November 27, 2019 FINDINGS: There is moderate stool in the colon. There is currently no appreciable bowel dilatation or air-fluid level to suggest bowel obstruction. No free air. IMPRESSION: Moderate stool in colon. No findings indicative of bowel obstruction or free air. Electronically Signed   By: Lowella Grip III M.D.   On: 11/28/2019 12:20   DG Abd 1 View  Result Date: 11/28/2019 CLINICAL DATA:  Abdominal pain.  Anticipated discharge EXAM: ABDOMEN - 1 VIEW COMPARISON:  Which yesterday FINDINGS: Formed stool throughout most of the colon without rectal impaction or obstructive pattern. No concerning mass effect or calcification. IMPRESSION: 1. No visible small bowel dilatation. 2. Generalized colonic stool. Electronically Signed   By: Monte Fantasia M.D.   On: 11/28/2019 05:28   Scheduled Meds: . (feeding supplement) PROSource Plus  30 mL Oral BID BM  . amLODipine  2.5 mg Oral BID  . ascorbic acid  2,000 mg Oral BID  . aspirin  81 mg Oral Daily  . buPROPion  300 mg Oral Daily  . cholecalciferol  2,000 Units Oral Daily  . enoxaparin (LOVENOX) injection  40 mg Subcutaneous Daily  . loratadine  10 mg Oral Daily  . losartan  100 mg Oral Daily  . metoprolol succinate  100 mg Oral BID  . montelukast  10 mg Oral QHS  .  multivitamin with minerals  1 tablet Oral  Daily  . pantoprazole  40 mg Oral Daily  . polyethylene glycol  17 g Oral BID  . senna-docusate  1 tablet Oral BID  . simvastatin  40 mg Oral Daily  . sodium chloride flush  3 mL Intravenous Q12H  . valACYclovir  500 mg Oral Daily   Continuous Infusions:   LOS: 0 days   Kerney Elbe, DO [facility] PAGER is on AMION  If 7PM-7AM, please contact night-coverage [email or web address]

## 2019-11-29 NOTE — Progress Notes (Signed)
No nausea or pain this shift.

## 2019-11-30 ENCOUNTER — Inpatient Hospital Stay (HOSPITAL_COMMUNITY): Payer: Medicare PPO

## 2019-11-30 DIAGNOSIS — K56600 Partial intestinal obstruction, unspecified as to cause: Secondary | ICD-10-CM | POA: Diagnosis not present

## 2019-11-30 LAB — CBC WITH DIFFERENTIAL/PLATELET
Abs Immature Granulocytes: 0.05 10*3/uL (ref 0.00–0.07)
Basophils Absolute: 0.1 10*3/uL (ref 0.0–0.1)
Basophils Relative: 1 %
Eosinophils Absolute: 0.3 10*3/uL (ref 0.0–0.5)
Eosinophils Relative: 3 %
HCT: 40.3 % (ref 36.0–46.0)
Hemoglobin: 14 g/dL (ref 12.0–15.0)
Immature Granulocytes: 0 %
Lymphocytes Relative: 10 %
Lymphs Abs: 1.2 10*3/uL (ref 0.7–4.0)
MCH: 33.1 pg (ref 26.0–34.0)
MCHC: 34.7 g/dL (ref 30.0–36.0)
MCV: 95.3 fL (ref 80.0–100.0)
Monocytes Absolute: 1.2 10*3/uL — ABNORMAL HIGH (ref 0.1–1.0)
Monocytes Relative: 9 %
Neutro Abs: 9.8 10*3/uL — ABNORMAL HIGH (ref 1.7–7.7)
Neutrophils Relative %: 77 %
Platelets: 256 10*3/uL (ref 150–400)
RBC: 4.23 MIL/uL (ref 3.87–5.11)
RDW: 12.5 % (ref 11.5–15.5)
WBC: 12.6 10*3/uL — ABNORMAL HIGH (ref 4.0–10.5)
nRBC: 0 % (ref 0.0–0.2)

## 2019-11-30 LAB — COMPREHENSIVE METABOLIC PANEL
ALT: 28 U/L (ref 0–44)
AST: 28 U/L (ref 15–41)
Albumin: 4.1 g/dL (ref 3.5–5.0)
Alkaline Phosphatase: 57 U/L (ref 38–126)
Anion gap: 13 (ref 5–15)
BUN: 13 mg/dL (ref 8–23)
CO2: 22 mmol/L (ref 22–32)
Calcium: 9.5 mg/dL (ref 8.9–10.3)
Chloride: 100 mmol/L (ref 98–111)
Creatinine, Ser: 0.96 mg/dL (ref 0.44–1.00)
GFR, Estimated: >60 mL/min (ref >60)
Glucose, Bld: 132 mg/dL — ABNORMAL HIGH (ref 70–99)
Potassium: 3.8 mmol/L (ref 3.5–5.1)
Sodium: 135 mmol/L (ref 135–145)
Total Bilirubin: 1.6 mg/dL — ABNORMAL HIGH (ref 0.3–1.2)
Total Protein: 6.4 g/dL — ABNORMAL LOW (ref 6.5–8.1)

## 2019-11-30 LAB — MAGNESIUM: Magnesium: 2.1 mg/dL (ref 1.7–2.4)

## 2019-11-30 LAB — PHOSPHORUS: Phosphorus: 3.4 mg/dL (ref 2.5–4.6)

## 2019-11-30 MED ORDER — POLYETHYLENE GLYCOL 3350 17 G PO PACK: 17.0000 g | PACK | Freq: Two times a day (BID) | ORAL | 0 refills | Status: AC

## 2019-11-30 MED ORDER — SENNOSIDES-DOCUSATE SODIUM 8.6-50 MG PO TABS: 1.0000 | ORAL_TABLET | Freq: Two times a day (BID) | ORAL | Status: DC

## 2019-11-30 NOTE — Discharge Summary (Signed)
Physician Discharge Summary  Cynthia Peters ZDG:[id number] DOB: [birth year] DOA: 11/27/2019  PCP: Aletha Halim., PA-C  Admit date: 11/27/2019 Discharge date: 11/30/2019  Admitted From: Home Disposition: Home  Recommendations for Outpatient Follow-up:  1. Follow up with PCP in 1-2 weeks 2. Call and schedule follow-up with gastroenterologist.  Home Health: Not applicable Equipment/Devices: Not applicable  Discharge Condition: Stable CODE STATUS: Full code Diet recommendation: Low-salt diet, low residue and high-fiber diet.  Discharge summary: Patient with history of multiple small bowel obstructions, abdominal surgeries and hernias, arthritis, depression, hypertension hyperlipidemia, irritable bowel syndrome presented with abdominal pain. She does have chronic abdominal pain and constipation. She has IBS with predominant constipation and takes MiraLAX daily. She went to [facility] ER, a CT scan showed an ileus versus partial small bowel obstruction within the proximal to mid ileal loops and was sent to [facility] for management.  Patient was treated conservatively initially with n.p.o. and subsequently with regular diet. Currently with normal bowel function. Repeat KUB today with resolved small bowel obstruction. Patient received bowel regimen, enema with improvement of symptoms. Tolerating diet No abdominal pain Passing small amount of stool, had some mucus and traces of bright red blood noticed after soap water enema which is probably due to outlet bleeding and that has stopped now. With adequate improvement, discharging home with increasing dose of laxatives, she will take MiraLAX twice a day and Senokot twice a day and dietary modification. Traces of blood mixed in the stool after enema is probably due to outlet bleeding, she will monitor for symptoms. Patient is also due for follow-up with a gastroenterologist and she will make an appointment.  Chronic  medical issues including anxiety/history of TIA/GERD/hypertension: They are fairly stable. Patient will resume all her medications.  Today patient is asymptomatic with normal valve function. She is able to go home today.  Discharge Diagnoses:  Principal Problem:   Partial small bowel obstruction (HCC) Active Problems:   Palpitations   Hypertension   Hyperlipidemia   Anxiety   Irritable bowel syndrome   SBO (small bowel obstruction) (HCC)   Asymptomatic bacteriuria   History of TIA (transient ischemic attack)   Thoracic aortic aneurysm [facility])    Discharge Instructions  Discharge Instructions    Call MD for:  persistant nausea and vomiting   Complete by: As directed    Call MD for:  severe uncontrolled pain   Complete by: As directed    Diet - low sodium heart healthy   Complete by: As directed    Increase activity slowly   Complete by: As directed      Allergies as of 11/30/2019      Reactions   Hydrocodone Rash   Hydrocodone-acetaminophen Rash   Tramadol Palpitations      Medication List    STOP taking these medications   lidocaine 5 % ointment Commonly known as: XYLOCAINE     TAKE these medications   acetaminophen 500 MG tablet Commonly known as: TYLENOL Take 1,000 mg by mouth every 6 (six) hours as needed (pain).   albuterol 108 (90 Base) MCG/ACT inhaler Commonly known as: VENTOLIN HFA Inhale 2 puffs into the lungs every 6 (six) hours as needed for wheezing or shortness of breath.   ALIGN PO Take 1 capsule by mouth 2 (two) times daily.   ALLEGRA PO Take 1 tablet by mouth daily as needed (ALLERGIES).   amLODipine 2.5 MG tablet Commonly known as: NORVASC Take 1 tablet (2.5 mg total)  by mouth 2 (two) times daily. Notes to patient: Last Dose of this Medication was given on 11/30/2019 at 9:56 am Please take a second dose tonight at bedtime    aspirin 81 MG chewable tablet Chew 81 mg by mouth daily. Notes to patient: Last Dose of this Medication was  given on 11/30/2019 at 9:57 am   buPROPion 300 MG 24 hr tablet Commonly known as: WELLBUTRIN XL Take 300 mg by mouth daily. Notes to patient: Last Dose of this Medication was given on 11/30/2019 at 9:58 am   clobetasol cream 0.05 % Commonly known as: TEMOVATE Apply 1 application topically 2 (two) times daily as needed (rash).   Co Q 10 100 MG Caps Take 100 mg by mouth daily.   [employer] Vitamin C 1000 MG tablet Generic drug: ascorbic acid Take 2,000 mg by mouth 2 (two) times daily. Notes to patient: Last Dose of this Medication was given on 11/30/2019 at 9:57 am Please take a second dose of this Medication tonight    dicyclomine 10 MG capsule Commonly known as: BENTYL Take 10 mg by mouth every 4 (four) hours as needed (for abdominal cramps).   Evening Primrose Oil 1000 MG Caps Take 1,000 mg by mouth daily.   Flaxseed (Linseed) 1000 MG Caps Take 1,000 mg by mouth 2 (two) times daily.   fluticasone 50 MCG/ACT nasal spray Commonly known as: FLONASE Place 2 sprays into both nostrils daily as needed for allergies or rhinitis.   hydrochlorothiazide 25 MG tablet Commonly known as: HYDRODIURIL Take 25 mg by mouth daily.   HYDROCORTISONE ACE (RECTAL) 30 MG Supp Place 30 mg rectally 2 (two) times daily as needed for hemorrhoids.   ibuprofen 200 MG tablet Commonly known as: ADVIL Take 400-600 mg by mouth 2 (two) times daily as needed (pain).   losartan 100 MG tablet Commonly known as: COZAAR TAKE 1 TABLET BY MOUTH EVERY DAY Notes to patient: Last Dose of this Medication was given on 11/30/2019 at 9:27 am    meclizine 25 MG tablet Commonly known as: ANTIVERT Take 1 tablet (25 mg total) by mouth 3 (three) times daily as needed for dizziness.   metoprolol succinate 100 MG 24 hr tablet Commonly known as: TOPROL-XL Take 1 tablet (100 mg total) by mouth 2 (two) times daily. Take with or immediately following a meal. Take 100 mg by mouth 2 (two) times daily. Take with or immediately  following a meal. Notes to patient: Last Dose of this Medication was given on 11/30/2019 at 9:57 am. Please take a second dose of this Medication tonight   montelukast 10 MG tablet Commonly known as: SINGULAIR Take 10 mg by mouth daily.   multivitamin with minerals Tabs tablet Take 1 tablet by mouth daily. Notes to patient: Last Dose of this Medication was given on 11/30/2019 at 9:57 am   nystatin 100000 UNIT/ML suspension Commonly known as: MYCOSTATIN Take 5 mLs by mouth daily as needed (FLARES, LICHEN PLANTIS).   nystatin cream Commonly known as: MYCOSTATIN Apply 1 application topically 2 (two) times daily as needed for dry skin.   omeprazole 20 MG capsule Commonly known as: PRILOSEC Take 1 capsule (20 mg total) by mouth daily.   ondansetron 4 MG disintegrating tablet Commonly known as: Zofran ODT Take 1 tablet (4 mg total) by mouth every 8 (eight) hours as needed for nausea or vomiting.   phenazopyridine 200 MG tablet Commonly known as: PYRIDIUM Take 200 mg by mouth 3 (three) times daily as needed for pain (  UNRINARY TRACT PAIN).   polyethylene glycol 17 g packet Commonly known as: MIRALAX / GLYCOLAX Take 17 g by mouth 2 (two) times daily. Notes to patient: Last Dose of this Medication was given on 11/30/2019 at 9:58 am Please take a second dose of this Medication tonight   potassium chloride SA 20 MEQ tablet Commonly known as: Klor-Con M20 TAKE 1&1/2 TABLETS BY MOUTH DAILY. PLEASE SCHEDULE ANNUAL APPT WITH DR. Martinique FOR REFILLS   PreviDent 5000 Dry Mouth 1.1 % Gel dental gel Generic drug: sodium fluoride Place 1 application onto teeth at bedtime.   senna-docusate 8.6-50 MG tablet Commonly known as: Senokot-S Take 1 tablet by mouth 2 (two) times daily. Notes to patient: Last Dose of this Medication was given on 11/30/2019 at 9:57 am Please take a second dose of this Medication tonight   simvastatin 40 MG tablet Commonly known as: ZOCOR Take 40 mg by mouth  daily. Notes to patient: Last Dose of this Medication was given on 11/30/2019 at 9:56 am   valACYclovir 500 MG tablet Commonly known as: VALTREX INCREASE TO TAKING 1 TABLET 2 TIMES A DAY FOR 3 DAYS WITH OUTBREAK What changed: See the new instructions. Notes to patient: Last Dose of this Medication was given on 11/30/2019 at 9:57 am   Vitamin D 50 MCG (2000 UT) Caps Take 2,000 Units by mouth daily. Notes to patient: Last Dose of this Medication was given on 11/30/2019 at 9:56 am   zolpidem 10 MG tablet Commonly known as: AMBIEN Take 5-10 mg by mouth at bedtime as needed for sleep.       Follow-up Information    Aletha Halim., PA-C Follow up in 2 week(s).   Specialty: Family Medicine Contact information: [address] [place] [address] [phone]        Wilford Corner, MD .   Specialty: Gastroenterology Contact information: [phone] [place] [address] [phone]        Martinique, Peter M, MD .   Specialty: Cardiology Contact information: [address]  [place] [address] [phone]              Allergies  Allergen Reactions  . Hydrocodone Rash  . Hydrocodone-Acetaminophen Rash  . Tramadol Palpitations    Consultations:  General surgery   Procedures/Studies: DG Abd 1 View  Result Date: 11/30/2019 CLINICAL DATA:  Small bowel obstruction EXAM: ABDOMEN - 1 VIEW COMPARISON:  Yesterday FINDINGS: Scattered colonic and small bowel gas without over distension. No abnormal stool retention or rectal impaction. Visible lung bases are clear. No concerning mass effect or calcification. IMPRESSION: Normal bowel gas pattern. Decreased stool volume from yesterday. Electronically Signed   By: Monte Fantasia M.D.   On: 11/30/2019 07:59   DG Abd 1 View  Result Date: 11/29/2019 CLINICAL DATA:  Reported small-bowel obstruction EXAM: ABDOMEN - 1 VIEW COMPARISON:  Abdominal radiograph November 28, 2019; CT abdomen  and pelvis November 27, 2019 FINDINGS: Moderate stool currently in colon. No bowel dilatation or air-fluid level to suggest bowel obstruction. No free air. Tiny foci of probable vascular calcification in the pelvis noted. IMPRESSION: No bowel obstruction or free air evident currently. Moderate stool in colon. Electronically Signed   By: Lowella Grip III M.D.   On: 11/29/2019 11:13   DG Abd 1 View  Result Date: 11/28/2019 CLINICAL DATA:  Recent small bowel obstruction EXAM: ABDOMEN - 1 VIEW COMPARISON:  November 28, 2019 study obtained earlier in the day; abdominal radiograph  and CT abdomen and pelvis November 27, 2019 FINDINGS: There is moderate stool in the colon. There is currently no appreciable bowel dilatation or air-fluid level to suggest bowel obstruction. No free air. IMPRESSION: Moderate stool in colon. No findings indicative of bowel obstruction or free air. Electronically Signed   By: Lowella Grip III M.D.   On: 11/28/2019 12:20   DG Abd 1 View  Result Date: 11/28/2019 CLINICAL DATA:  Abdominal pain.  Anticipated discharge EXAM: ABDOMEN - 1 VIEW COMPARISON:  Which yesterday FINDINGS: Formed stool throughout most of the colon without rectal impaction or obstructive pattern. No concerning mass effect or calcification. IMPRESSION: 1. No visible small bowel dilatation. 2. Generalized colonic stool. Electronically Signed   By: Monte Fantasia M.D.   On: 11/28/2019 05:28   DG Abd 1 View  Result Date: 11/27/2019 CLINICAL DATA:  Small-bowel obstruction. EXAM: ABDOMEN - 1 VIEW COMPARISON:  CT abdomen from earlier same day. FINDINGS: The mildly prominent fluid-filled loops of small bowel seen on today's earlier CT are not able to be visualized by plain film. Moderate amount of stool and gas within the nondistended colon. No evidence of abnormal fluid collection. No evidence of renal or ureteral calculi. Linear foreign body overlying the lower pelvis was not present on today's earlier CT, of  uncertain etiology or function. IMPRESSION: 1. The mildly prominent fluid-filled loops of small bowel seen on today's earlier CT, suggesting partial small bowel obstruction versus ileus caused by underlying enteritis, are not able to be visualized by plain film. 2. Linear foreign body overlying the lower pelvis/bladder, not present on today's earlier CT, of uncertain etiology or function. Clinical correlation recommended. Electronically Signed   By: Franki Cabot M.D.   On: 11/27/2019 11:36   CT ABDOMEN PELVIS W CONTRAST  Result Date: 11/27/2019 CLINICAL DATA:  Diffuse abdominal pain EXAM: CT ABDOMEN AND PELVIS WITH CONTRAST TECHNIQUE: Multidetector CT imaging of the abdomen and pelvis was performed using the standard protocol following bolus administration of intravenous contrast. CONTRAST:  161mL OMNIPAQUE IOHEXOL 300 MG/ML  SOLN COMPARISON:  April 01, 2019 FINDINGS: Lower chest: The visualized heart size within normal limits. No pericardial fluid/thickening. No hiatal hernia. The visualized portions of the lungs are clear. Hepatobiliary: Multiple low-density lesions are seen throughout the liver parenchyma the largest within the superior right hepatic dome measuring 2 cm. These are likely hepatic cysts. No evidence of calcified gallstones, gallbladder wall thickening or biliary dilatation. Pancreas: Unremarkable. No pancreatic ductal dilatation or surrounding inflammatory changes. Spleen: Normal in size without focal abnormality. Adrenals/Urinary Tract: Both adrenal glands appear normal. The kidneys and collecting system appear normal without evidence of urinary tract calculus or hydronephrosis. Bladder is unremarkable. Stomach/Bowel: The stomach is normal in appearance. There is mildly dilated air and fluid-filled loops of small bowel with a gradual area of narrowing seen within the [place] Vascular/Lymphatic: There are no enlarged mesenteric, retroperitoneal, or pelvic lymph nodes. No significant vascular  findings are present. Reproductive: Other: No evidence of abdominal wall mass or hernia. Musculoskeletal: No acute or significant osseous findings. There is a grade 1 anterolisthesis of L4 on L5 measuring 4 mm. IMPRESSION: Findings suggestive of ileus versus partial small obstruction within the proximal to mid ileal loops. There is fecalization of the terminal ileum which could be due to true slow transit. Electronically Signed   By: Prudencio Pair M.D.   On: 11/27/2019 03:20   (Echo, Carotid, EGD, Colonoscopy, ERCP)    Subjective: Patient seen and examined.  Her son  was at the bedside.  Patient denied any nausea vomiting.  Denied any abdominal pain.  She has some abdominal discomfort which is longstanding but denies any bloating sensation. She had multiple small bowel movements since yesterday, some of them were with mucus.  She also had some streaks of blood after enema.   Discharge Exam: Vitals:   11/30/19 0505 11/30/19 0956  BP: 137/69 135/62  Pulse: 70 70  Resp: 16   Temp: 98.9 F (37.2 C)   SpO2: 96%    Vitals:   11/29/19 2145 11/29/19 2339 11/30/19 0505 11/30/19 0956  BP: (!) 141/67 140/75 137/69 135/62  Pulse: 75 77 70 70  Resp: 18 18 16    Temp: 98.2 F (36.8 C) 99.4 F (37.4 C) 98.9 F (37.2 C)   TempSrc: Oral Oral Oral   SpO2: 96% 98% 96%   Weight:      Height:        General: Pt is alert, awake, not in acute distress Cardiovascular: RRR, S1/S2 +, no rubs, no gallops Respiratory: CTA bilaterally, no wheezing, no rhonchi Abdominal: Soft, NT, ND, bowel sounds +, no rigidity or guarding. Extremities: no edema, no cyanosis    The results of significant diagnostics from this hospitalization (including imaging, microbiology, ancillary and laboratory) are listed below for reference.     Microbiology: Recent Results (from the past 240 hour(s))  Respiratory Panel by RT PCR (Flu A&B, Covid) - Nasopharyngeal Swab     Status: None   Collection Time: 11/27/19  3:39 AM    Specimen: Nasopharyngeal Swab  Result Value Ref Range Status   SARS Coronavirus 2 by RT PCR NEGATIVE NEGATIVE Final    Comment: (NOTE) SARS-CoV-2 target nucleic acids are NOT DETECTED.  The SARS-CoV-2 RNA is generally detectable in upper respiratoy specimens during the acute phase of infection. The lowest concentration of SARS-CoV-2 viral copies this assay can detect is 131 copies/mL. A negative result does not preclude SARS-Cov-2 infection and should not be used as the sole basis for treatment or other patient management decisions. A negative result may occur with  improper specimen collection/handling, submission of specimen other than nasopharyngeal swab, presence of viral mutation(s) within the areas targeted by this assay, and inadequate number of viral copies (<131 copies/mL). A negative result must be combined with clinical observations, patient history, and epidemiological information. The expected result is Negative.  Fact Sheet for Patients:  PinkCheek.be  Fact Sheet for Healthcare Providers:  GravelBags.it  This test is no t yet approved or cleared by the [place] FDA and  has been authorized for detection and/or diagnosis of SARS-CoV-2 by FDA under an Emergency Use Authorization (EUA). This EUA will remain  in effect (meaning this test can be used) for the duration of the COVID-19 declaration under Section 564(b)(1) of the Act, 21 U.S.C. section 360bbb-3(b)(1), unless the authorization is terminated or revoked sooner.     Influenza A by PCR NEGATIVE NEGATIVE Final   Influenza B by PCR NEGATIVE NEGATIVE Final    Comment: (NOTE) The Xpert Xpress SARS-CoV-2/FLU/RSV assay is intended as an aid in  the diagnosis of influenza from Nasopharyngeal swab specimens and  should not be used as a sole basis for treatment. Nasal washings and  aspirates are unacceptable for Xpert Xpress SARS-CoV-2/FLU/RSV   testing.  Fact Sheet for Patients: PinkCheek.be  Fact Sheet for Healthcare Providers: GravelBags.it  This test is not yet approved or cleared by the [place] FDA and  has been authorized for detection and/or diagnosis  of SARS-CoV-2 by  FDA under an Emergency Use Authorization (EUA). This EUA will remain  in effect (meaning this test can be used) for the duration of the  Covid-19 declaration under Section 564(b)(1) of the Act, 21  U.S.C. section 360bbb-3(b)(1), unless the authorization is  terminated or revoked. Performed at [facility], [address] [place], [place] [address]      Labs: BNP (last 3 results) No results for input(s): BNP in the last 8760 hours. Basic Metabolic Panel: Recent Labs  Lab 11/27/19 0149 11/27/19 0647 11/28/19 0451 11/29/19 0421 11/30/19 0417  NA 138 138 140 141 135  K 3.8 4.3 4.0 3.8 3.8  CL 100 102 107 104 100  CO2 27 27 24 27 22   GLUCOSE 140* 134* 84 87 132*  BUN 24* 21 11 9 13   CREATININE 0.82 0.78 0.81 0.79 0.96  CALCIUM 9.0 8.8* 9.0 9.2 9.5  MG  --  2.1 2.1 2.1 2.1  PHOS  --   --  3.2 3.6 3.4   Liver Function Tests: Recent Labs  Lab 11/27/19 0149 11/27/19 1030 11/28/19 0451 11/29/19 0421 11/30/19 0417  AST 27 24 27 23 28   ALT 24 23 21 22 28   ALKPHOS 59 53 51 51 57  BILITOT 1.3* 1.0 1.4* 1.5* 1.6*  PROT 6.8 6.0* 6.0* 5.9* 6.4*  ALBUMIN 4.2 3.9 3.6 3.7 4.1   Recent Labs  Lab 11/27/19 0149  LIPASE 27   No results for input(s): AMMONIA in the last 168 hours. CBC: Recent Labs  Lab 11/27/19 0149 11/27/19 0647 11/28/19 0451 11/29/19 0421 11/30/19 0417  WBC 14.6* 13.2* 10.0 7.9 12.6*  NEUTROABS 11.3*  --  5.7 4.3 9.8*  HGB 14.5 13.6 13.9 12.6 14.0  HCT 41.6 39.4 41.7 36.6 40.3  MCV 93.9 96.6 98.6 96.8 95.3  PLT 243 241 246 212 256   Cardiac Enzymes: No results for input(s): CKTOTAL, CKMB, CKMBINDEX, TROPONINI in the last 168  hours. BNP: Invalid input(s): POCBNP CBG: No results for input(s): GLUCAP in the last 168 hours. D-Dimer No results for input(s): DDIMER in the last 72 hours. Hgb A1c No results for input(s): HGBA1C in the last 72 hours. Lipid Profile No results for input(s): CHOL, HDL, LDLCALC, TRIG, CHOLHDL, LDLDIRECT in the last 72 hours. Thyroid function studies No results for input(s): TSH, T4TOTAL, T3FREE, THYROIDAB in the last 72 hours.  Invalid input(s): FREET3 Anemia work up No results for input(s): VITAMINB12, FOLATE, FERRITIN, TIBC, IRON, RETICCTPCT in the last 72 hours. Urinalysis    Component Value Date/Time   COLORURINE YELLOW 11/27/2019 0204   APPEARANCEUR HAZY (A) 11/27/2019 0204   LABSPEC >1.030 (H) 11/27/2019 0204   PHURINE 6.0 11/27/2019 0204   GLUCOSEU NEGATIVE 11/27/2019 0204   HGBUR NEGATIVE 11/27/2019 0204   BILIRUBINUR NEGATIVE 11/27/2019 0204   BILIRUBINUR n 10/20/2014 0949   KETONESUR 15 (A) 11/27/2019 0204   PROTEINUR NEGATIVE 11/27/2019 0204   UROBILINOGEN negative 10/20/2014 0949   UROBILINOGEN 0.2 09/09/2014 0942   NITRITE NEGATIVE 11/27/2019 0204   LEUKOCYTESUR SMALL (A) 11/27/2019 0204   Sepsis Labs Invalid input(s): PROCALCITONIN,  WBC,  LACTICIDVEN Microbiology Recent Results (from the past 240 hour(s))  Respiratory Panel by RT PCR (Flu A&B, Covid) - Nasopharyngeal Swab     Status: None   Collection Time: 11/27/19  3:39 AM   Specimen: Nasopharyngeal Swab  Result Value Ref Range Status   SARS Coronavirus 2 by RT PCR NEGATIVE NEGATIVE Final    Comment: (NOTE) SARS-CoV-2  target nucleic acids are NOT DETECTED.  The SARS-CoV-2 RNA is generally detectable in upper respiratoy specimens during the acute phase of infection. The lowest concentration of SARS-CoV-2 viral copies this assay can detect is 131 copies/mL. A negative result does not preclude SARS-Cov-2 infection and should not be used as the sole basis for treatment or other patient management  decisions. A negative result may occur with  improper specimen collection/handling, submission of specimen other than nasopharyngeal swab, presence of viral mutation(s) within the areas targeted by this assay, and inadequate number of viral copies (<131 copies/mL). A negative result must be combined with clinical observations, patient history, and epidemiological information. The expected result is Negative.  Fact Sheet for Patients:  PinkCheek.be  Fact Sheet for Healthcare Providers:  GravelBags.it  This test is no t yet approved or cleared by the [place] FDA and  has been authorized for detection and/or diagnosis of SARS-CoV-2 by FDA under an Emergency Use Authorization (EUA). This EUA will remain  in effect (meaning this test can be used) for the duration of the COVID-19 declaration under Section 564(b)(1) of the Act, 21 U.S.C. section 360bbb-3(b)(1), unless the authorization is terminated or revoked sooner.     Influenza A by PCR NEGATIVE NEGATIVE Final   Influenza B by PCR NEGATIVE NEGATIVE Final    Comment: (NOTE) The Xpert Xpress SARS-CoV-2/FLU/RSV assay is intended as an aid in  the diagnosis of influenza from Nasopharyngeal swab specimens and  should not be used as a sole basis for treatment. Nasal washings and  aspirates are unacceptable for Xpert Xpress SARS-CoV-2/FLU/RSV  testing.  Fact Sheet for Patients: PinkCheek.be  Fact Sheet for Healthcare Providers: GravelBags.it  This test is not yet approved or cleared by the [place] FDA and  has been authorized for detection and/or diagnosis of SARS-CoV-2 by  FDA under an Emergency Use Authorization (EUA). This EUA will remain  in effect (meaning this test can be used) for the duration of the  Covid-19 declaration under Section 564(b)(1) of the Act, 21  U.S.C. section 360bbb-3(b)(1), unless the  authorization is  terminated or revoked. Performed at [facility], [address] [place], [place] [address]      Time coordinating discharge:  35 minutes  SIGNED:   Barb Merino, MD  [facility] 11/30/2019, 12:59 PM

## 2019-11-30 NOTE — Progress Notes (Signed)
Pt to be discharged to home this afternoon. Pt and Pt's Son given discharge teaching including all Medications and schedules for these Medications. Pt and Pt's Son verbalized understanding of all discharge teaching. Discharge packet with pt at time of discharge

## 2019-12-09 ENCOUNTER — Other Ambulatory Visit: Payer: Self-pay

## 2019-12-09 ENCOUNTER — Ambulatory Visit (HOSPITAL_COMMUNITY): Payer: Medicare PPO | Attending: Cardiology

## 2019-12-09 DIAGNOSIS — I712 Thoracic aortic aneurysm, without rupture, unspecified: Secondary | ICD-10-CM

## 2019-12-09 DIAGNOSIS — I493 Ventricular premature depolarization: Secondary | ICD-10-CM

## 2019-12-09 LAB — ECHOCARDIOGRAM COMPLETE
Area-P 1/2: 3.06 cm2
P 1/2 time: 609 msec
S' Lateral: 2.7 cm

## 2019-12-21 ENCOUNTER — Other Ambulatory Visit: Payer: Self-pay | Admitting: Medical

## 2019-12-21 DIAGNOSIS — I493 Ventricular premature depolarization: Secondary | ICD-10-CM

## 2020-01-18 ENCOUNTER — Other Ambulatory Visit: Payer: Self-pay | Admitting: Cardiology

## 2020-01-18 DIAGNOSIS — I493 Ventricular premature depolarization: Secondary | ICD-10-CM

## 2020-02-13 ENCOUNTER — Other Ambulatory Visit: Payer: Self-pay | Admitting: Cardiology

## 2020-02-13 DIAGNOSIS — I493 Ventricular premature depolarization: Secondary | ICD-10-CM

## 2020-02-15 DIAGNOSIS — I493 Ventricular premature depolarization: Secondary | ICD-10-CM

## 2020-02-16 MED ORDER — POTASSIUM CHLORIDE CRYS ER 20 MEQ PO TBCR: EXTENDED_RELEASE_TABLET | ORAL | 3 refills | Status: AC

## 2020-03-05 ENCOUNTER — Ambulatory Visit: Payer: Medicare PPO | Admitting: Dietician

## 2020-04-30 ENCOUNTER — Other Ambulatory Visit: Payer: Self-pay

## 2020-04-30 ENCOUNTER — Encounter: Payer: Medicare PPO | Attending: Gastroenterology | Admitting: Dietician

## 2020-04-30 ENCOUNTER — Encounter: Payer: Self-pay | Admitting: Dietician

## 2020-04-30 DIAGNOSIS — E669 Obesity, unspecified: Secondary | ICD-10-CM | POA: Insufficient documentation

## 2020-04-30 DIAGNOSIS — E663 Overweight: Secondary | ICD-10-CM | POA: Insufficient documentation

## 2020-04-30 DIAGNOSIS — K56609 Unspecified intestinal obstruction, unspecified as to partial versus complete obstruction: Secondary | ICD-10-CM | POA: Diagnosis present

## 2020-04-30 NOTE — Patient Instructions (Signed)
Avoid constipation Avoid skipping meals Eat small frequent meals Be sure to include tender protein with each meal (egg, cottage cheese, yogurt, fish, tofu, tender meat, protein powder, etc.) Chew foods very well.  Continue to be mindful of sugar and sodium.

## 2020-04-30 NOTE — Progress Notes (Signed)
Medical Nutrition Therapy  Appointment Start time:  [id number]  Appointment End time:  1715  Primary concerns today: History of 4 abdominal hernia surgeries with increased scar tissue that has contributed to 5 bowel obstructions.  She would like to know what she needs to do to avoid more bowel obstructions.  She states that she has been the same weight for a long time.  She would like to lose weight particularly in her abdominal region but states that some of the girth is due to the muscle issues post op and due to the hernias.  Referral diagnosis: bowel obstruction Preferred learning style: no preference indicated Learning readiness: change in progress   NUTRITION ASSESSMENT   Anthropometrics  64" 167 lbs 04/30/2020  163 lbs UBW 145-150 lbs lowest adult weight in her 20's and 30's.   Clinical Medical Hx: 4 hernia surgeries, 5 bowel obstructions, interstitial cystitis 2018, GERD, HLD, HTN Medications: Murelax daily Notable Signs/Symptoms: abdominal distention, hard abdomen per patient  Lifestyle & Dietary Hx Patient lives alone.  Divorced.  Dislikes living alone.   She is a retired Automotive engineer. Reads labels for fat, sodium, sugar.  Limits increased amounts of salads and avoids celery, pineapple and tough meats. Diet is inadequate in protein and other nutrients.  Supplements: vitamin D and vitamin C Current average weekly physical activity: limited due to osteoarthritis.  Went to the gym prior to covid.  Is looking into a recumbant bike.  24-Hr Dietary Recall Label reads for fat, sodium, and sugar No soda. First Meal: usually skips OR occasional eggs, grits, toast OR yogurt Snack:  Second Meal: leftover [employer] pasta (1/2 cup) Snack: potato chips OR V-8 juice and crackers Third Meal: [employer] pasta, salad, occasional Kind bar Snack: cheese cracker Beverages: water (4-5 cups daily), coffee with 1 1/2 creamer and stevia (decaf and regular), occasional juice, rare  tea  Estimated Energy Needs Calories: 1200 Carbohydrate: 135-150g Protein: 70-80g Fat: 35g   NUTRITION DIAGNOSIS  NB-1.1 Food and nutrition-related knowledge deficit As related to eating for bowel obstruction prevention and weight control.  As evidenced by diet hx and patient report.   NUTRITION INTERVENTION  Nutrition education (E-1) on the following topics:  . Physical changes due to multiple surgeries and muscle changes. . Balancing meals to improve nutrition and avoid weight gain . Discussion of bowel obstructions causes and dietary intervention to include avoidingconstipation, obtain adequate nutrition, include soluble fiber as tolerated, avoid stringy tough foods such as celery and pineapple, chew foods well . Discussed that some causes of bowel obstruction are not nutrition related. . Adequate fluid intake  Handouts Provided Include   My Plate  Meal plan card  Eating well to lower the risk of a bowel obstruction  Learning Style & Readiness for Change Teaching method utilized: Visual & Auditory  Demonstrated degree of understanding via: Teach Back  Barriers to learning/adherence to lifestyle change: none  Goals Avoid constipation Avoid skipping meals Eat small frequent meals Be sure to include tender protein with each meal (egg, cottage cheese, yogurt, fish, tofu, tender meat, protein powder, etc.) Chew foods very well.  Continue to be mindful of sugar and sodium.   MONITORING & EVALUATION Dietary intake, weekly physical activity prn..  Next Steps  Patient is to call or email for any questions.

## 2020-05-01 ENCOUNTER — Telehealth: Payer: Self-pay | Admitting: Cardiology

## 2020-05-01 NOTE — Telephone Encounter (Signed)
    STAT if patient feels like he/she is going to faint   1) Are you dizzy now? No  2) Do you feel faint or have you passed out? No  3) Do you have any other symptoms?   4) Have you checked your HR and BP (record if available)? BP 154/90ish   Pt said her dizziness feel different today, she has blurred vision with vertigo, she also checked her BP it was 154/90ish. She wanted to know what she needs to do

## 2020-05-01 NOTE — Telephone Encounter (Signed)
Returned call to patient-patient states she has a history of vertigo and double vision.  Reports experiencing these symptoms over the last few years without an explanation. States she sees a eye doctor and has been the ER 2 times in the past without explanation.   These symptoms usually occur separately, not at the same time.  Today she reports having an episode of double vision follow by dizziness/vertigo that lasted approximately 5 mins before resolving.   She states it usually doesn't last this long and she got concerned so she checked her BP.   She has not been checking at home but states the avg of 6 readings today was 159/89.   Denies SOB/CP, HA, weakness to arms or legs, trouble speaking, etc.   She is unsure who she needs to follow up with.  Last VV: 11/2019 with Roby Lofts PA Hx: htn, hld, palpitations, TAA, GERD  Current BP meds:  Amlodipine 2.5 mg BID HCTZ 25 daily Losartan 100 daily Toprol 100 BID  Advised to call PCP and eye doctor to follow up (as they are aware of hx and previous workup for this) but also would send to Dr. Martinique to review (may be appt for BP management).    Also discussed ER precautions-if symptoms reoccur then I encourage her to proceed to ER/call 911 for evaluation asap.     Also advised to start monitoring BP 2 times daily and keep a log.    Patient verbalized understanding. Routed to MD to review for further recommendations.

## 2020-05-02 ENCOUNTER — Other Ambulatory Visit: Payer: Self-pay

## 2020-05-02 ENCOUNTER — Encounter (HOSPITAL_BASED_OUTPATIENT_CLINIC_OR_DEPARTMENT_OTHER): Payer: Self-pay

## 2020-05-02 ENCOUNTER — Other Ambulatory Visit: Payer: Self-pay | Admitting: Medical

## 2020-05-02 ENCOUNTER — Emergency Department (HOSPITAL_BASED_OUTPATIENT_CLINIC_OR_DEPARTMENT_OTHER)
Admission: EM | Admit: 2020-05-02 | Discharge: 2020-05-02 | Disposition: A | Discharge status: Home / Self Care | Payer: Medicare PPO | Attending: Emergency Medicine | Admitting: Emergency Medicine

## 2020-05-02 DIAGNOSIS — Z79899 Other long term (current) drug therapy: Secondary | ICD-10-CM | POA: Diagnosis not present

## 2020-05-02 DIAGNOSIS — Z87891 Personal history of nicotine dependence: Secondary | ICD-10-CM | POA: Insufficient documentation

## 2020-05-02 DIAGNOSIS — Z7982 Long term (current) use of aspirin: Secondary | ICD-10-CM | POA: Insufficient documentation

## 2020-05-02 DIAGNOSIS — I10 Essential (primary) hypertension: Secondary | ICD-10-CM | POA: Insufficient documentation

## 2020-05-02 DIAGNOSIS — R42 Dizziness and giddiness: Secondary | ICD-10-CM | POA: Insufficient documentation

## 2020-05-02 DIAGNOSIS — H6122 Impacted cerumen, left ear: Secondary | ICD-10-CM | POA: Insufficient documentation

## 2020-05-02 DIAGNOSIS — J4520 Mild intermittent asthma, uncomplicated: Secondary | ICD-10-CM | POA: Diagnosis not present

## 2020-05-02 MED ORDER — MECLIZINE HCL 25 MG PO TABS: 25.0000 mg | ORAL_TABLET | Freq: Three times a day (TID) | ORAL | 0 refills | Status: AC | PRN

## 2020-05-02 NOTE — ED Provider Notes (Signed)
[facility] EMERGENCY DEPARTMENT Provider Note   CSN: [id number] Arrival date & time: 05/02/20  1314     History Chief Complaint  Patient presents with  . Dizziness     Cynthia Peters is a 70 y.o. female with PMHx vertigo, HTN, mild aortic insufficiency, IBS, TIA, presenting to the ED for evaluation of dizziness. Patient states she has had similar symptoms in the past and treats with meclizine. She states she has been having intermittent symptoms over the last few days. She states the movement that provoked her dizziness today was walking forward carrying a tray of food to her den. She states she began feeling like she was falling forward. She sat on the couch and she felt continuing symptoms of vertigo which has felt like her previous. Her symptoms resolved after about 5 minutes of rest. She treated with meclizine since then and symptoms have not recurred. She states she also had some double vision with her symptoms which has also occurred in the past with her vertigo symptoms. She has been having higher BP than usual over the last few days as well, and was concerned that her dizziness began occurring again as well. She denies any other symptoms including N/V, chest pain, headache, numbness/weakness, speech difficulty. She does have recurring left cerumen impaction which PCP will treat. She denies associated tinnitus or hearing loss.   She called her cardiologist office, who felt symptoms were not related to her history of mild aortic dilation and mild aortic insufficiency.   The history is provided by the patient and medical records.       Past Medical History:  Diagnosis Date  . Anemia    history in the past, not on a regular basis  . Arthritis   . Bronchitis   . Cataracts, bilateral    immature  . Claustrophobia    takes Valium daily as needed  . Complication of anesthesia    2013 SURGERY vocal cord bothering pt, used smaller ent tube after 2 hernia repair, no  problem after . " I woke up during my MRI and colonoscopy."       . Depression    takes Wellbutrin daily  . Dry eye   . Dry mouth   . GERD (gastroesophageal reflux disease)    takes Omeprazole daily   . H/O hiatal hernia   . Headache    migraines with flashing lights   . History of blood clots 40 yrs    leg   . History of echocardiogram 2013   a. Echo (05/2011): Mild LVH, EF 65-70%, no WMA, Gr 1 DD, mild AI.  Marland Kitchen History of MRSA infection   . History of staph infection   . History of stress test 2010   a. ETT-Echo in 2010 was normal  . History of TIA (transient ischemic attack) 2013   a. Carotid US (05/2011): No ICA stenosis, pt unsure of this  . Hyperlipidemia    takes Simvastatin daily   . Hypertension    takes  Losartan daily  . IBS (irritable bowel syndrome)    takes Bentyl daily as needed  . Insomnia    takes Ambien nightly  . Interstitial cystitis   . Lichen planus   . Mild asthma    Albuterol inhaler as needed  . Palpitations    Event monitor in 2007 with rare PVCs // takes Metoprolol daily  . PVC (premature ventricular contraction)   . Seasonal allergies    takes Claritin daily as  needed.Takes Singulair daily as needed.  Marland Kitchen TIA (transient ischemic attack) 05/27/2011  . Urethral stricture   . Vertigo    takes Meclizine daily as needed    Patient Active Problem List   Diagnosis Date Noted  . Asymptomatic bacteriuria 11/27/2019  . History of TIA (transient ischemic attack) 11/27/2019  . Thoracic aortic aneurysm ([place]) 11/27/2019  . Cubital tunnel syndrome 06/09/2019  . Osteoarthritis of carpometacarpal (CMC) joint of thumb 12/16/2017  . Pain of left thumb 12/16/2017  . Insect sting 09/17/2017  . History of transient ischemic attack (TIA) 07/16/2017  . GERD (gastroesophageal reflux disease) 07/16/2017  . Partial small bowel obstruction ([place]) 07/16/2017  . Arthralgia of right elbow 07/08/2017  . 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 SBO (small bowel  obstruction) ([place]) 05/29/2017  . Bilateral leg edema 02/19/2017  . Neck pain 02/19/2017  . Vitamin D deficiency 02/19/2017  . Right radial head fracture 06/17/2016  . Hypertension 05/30/2015  . Hyperlipidemia 05/30/2015  . Recurrent ventral incisional hernia s/p lap repairs 2013, 2016, 2017 10/25/2014  . Anxiety 10/20/2014  . Irritable bowel syndrome 10/20/2014  . Interstitial cystitis 03/15/2013  . Lichen planus 09/16/4816  . Palpitations   . PVC's (premature ventricular contractions)     Past Surgical History:  Procedure Laterality Date  . CESAREAN SECTION    . COLONOSCOPY WITH PROPOFOL N/A 08/10/2012   Procedure: COLONOSCOPY WITH PROPOFOL;  Surgeon: Garlan Fair, MD;  Location: [facility];  Service: Endoscopy;  Laterality: N/A;  . CYSTOSCOPY WITH URETHRAL DILATATION N/A 02/18/2013   Procedure: CYSTOSCOPY WITH URETHRAL DILATATION ( NO BALLOON) AND HYDRODISTENSION;  Surgeon: Alexis Frock, MD;  Location: [facility];  Service: Urology;  Laterality: N/A;  . ENDOMETRIAL ABLATION W/ HYDROTHERMABLATOR  09-27-2002  . INCISIONAL HERNIA REPAIR N/A 10/25/2014   Procedure: HERNIA REPAIR INCISIONAL;  Surgeon: Ralene Ok, MD;  Location: [facility];  Service: General;  Laterality: N/A;  . INCISIONAL HERNIA REPAIR N/A 03/26/2015   Procedure: LAPAROSCOPIC INCISIONAL HERNIA;  Surgeon: Ralene Ok, MD;  Location: [facility];  Service: General;  Laterality: N/A;  . INSERTION OF MESH N/A 10/25/2014   Procedure: INSERTION OF MESH;  Surgeon: Ralene Ok, MD;  Location: [facility];  Service: General;  Laterality: N/A;  . LAPAROSCOPIC LYSIS OF ADHESIONS N/A 03/26/2015   Procedure: LAPAROSCOPIC LYSIS OF ADHESIONS;  Surgeon: Ralene Ok, MD;  Location: [facility];  Service: General;  Laterality: N/A;  . LAPAROTOMY N/A 10/25/2014   Procedure: EXPLORATORY LAPAROTOMY;  Surgeon: Ralene Ok, MD;  Location: [facility];  Service: General;  Laterality: N/A;  . LYSIS OF ADHESION N/A 10/25/2014    Procedure: LYSIS OF ADHESIONS ;  Surgeon: Ralene Ok, MD;  Location: [facility];  Service: General;  Laterality: N/A;  . MUCOSAL ADVANCEMENT FLAP N/A 10/25/2014   Procedure: MUCOSAL ADVANCEMENT FLAP;  Surgeon: Ralene Ok, MD;  Location: [facility];  Service: General;  Laterality: N/A;  . ORIF RADIAL FRACTURE Right 06/17/2016   Procedure: right radial head arthroplasty with ligament repair, reconstruciton;  Surgeon: Roseanne Kaufman, MD;  Location: [facility];  Service: Orthopedics;  Laterality: Right;  . RADIAL HEAD ARTHROPLASTY Right 06/17/2016  . TOOTH EXTRACTION    . TRANSTHORACIC ECHOCARDIOGRAM  05-26-2011   MILD LVH/  EF 56-31%/  GRADE I DIASTOLIC DYSFUNCTION/  MILD AR//   06-20-2008  NOMRAL STRESS ECHO  . UMBILICAL HERNIA REPAIR  2013   AND REVISION THE SAME YEAR W/ MESH  . WISDOM TOOTH EXTRACTION  AGE 30  OB History    Gravida  3   Para  3   Term  3   Preterm      AB      Living  2     SAB      IAB      Ectopic      Multiple      Live Births              Family History  Problem Relation Age of Onset  . Dementia Mother   . Leukemia Child   . Cirrhosis Father     Social History   Tobacco Use  . Smoking status: Former Smoker    Packs/day: 0.25    Years: 2.00    Pack years: 0.50    Types: Cigarettes    Quit date: 02/04/1976    Years since quitting: 44.2  . Smokeless tobacco: Never Used  Vaping Use  . Vaping Use: Never used  Substance Use Topics  . Alcohol use: Yes    Comment: occasional  . Drug use: No    Home Medications Prior to Admission medications   Medication Sig Start Date End Date Taking? Authorizing Provider  acetaminophen (TYLENOL) 500 MG tablet Take 1,000 mg by mouth every 6 (six) hours as needed (pain).     [provider]  albuterol (PROVENTIL HFA;VENTOLIN HFA) 108 (90 BASE) MCG/ACT inhaler Inhale 2 puffs into the lungs every 6 (six) hours as needed for wheezing or shortness of breath.    [provider]  amLODipine  (NORVASC) 2.5 MG tablet Take 1 tablet (2.5 mg total) by mouth 2 (two) times daily. 11/23/19   Kroeger, Lorelee Cover., PA-C  aspirin 81 MG chewable tablet Chew 81 mg by mouth daily.    [provider]  buPROPion (WELLBUTRIN XL) 300 MG 24 hr tablet Take 300 mg by mouth daily.    [provider]  Cholecalciferol (VITAMIN D) 2000 units CAPS Take 2,000 Units by mouth daily.    [provider]  clobetasol cream (TEMOVATE) 0.93 % Apply 1 application topically 2 (two) times daily as needed (rash).  04/01/16   [provider]  Coenzyme Q10 (CO Q 10) 100 MG CAPS Take 100 mg by mouth daily.     [provider]  CVS VITAMIN C 1000 MG tablet Take 2,000 mg by mouth 2 (two) times daily.  06/13/16   [provider]  dicyclomine (BENTYL) 10 MG capsule Take 10 mg by mouth every 4 (four) hours as needed (for abdominal cramps).  02/13/15   [provider]  Evening Primrose Oil 1000 MG CAPS Take 1,000 mg by mouth daily.    [provider]  Fexofenadine HCl (ALLEGRA PO) Take 1 tablet by mouth daily as needed (ALLERGIES).    [provider]  Flaxseed, Linseed, 1000 MG CAPS Take 1,000 mg by mouth 2 (two) times daily.    [provider]  fluticasone (FLONASE) 50 MCG/ACT nasal spray Place 2 sprays into both nostrils daily as needed for allergies or rhinitis.     [provider]  hydrochlorothiazide 25 MG tablet Take 25 mg by mouth daily.    [provider]  HYDROCORTISONE ACE, RECTAL, 30 MG SUPP Place 30 mg rectally 2 (two) times daily as needed for hemorrhoids. 04/07/16   [provider]  ibuprofen (ADVIL,MOTRIN) 200 MG tablet Take 400-600 mg by mouth 2 (two) times daily as needed (pain).     [provider]  losartan (COZAAR)  100 MG tablet TAKE 1 TABLET BY MOUTH EVERY DAY Patient taking differently: Take 100 mg by mouth daily. 09/14/17   Martinique, Peter M, MD  meclizine (ANTIVERT) 25 MG tablet Take 1 tablet (25  mg total) by mouth 3 (three) times daily as needed for dizziness. 05/02/20   Silvestre Mines, Martinique N, PA-C  metoprolol succinate (TOPROL-XL) 100 MG 24 hr tablet Take 1 tablet (100 mg total) by mouth 2 (two) times daily. Take with or immediately following a meal. Take 100 mg by mouth 2 (two) times daily. Take with or immediately following a meal. 07/14/18   Martinique, Peter M, MD  montelukast (SINGULAIR) 10 MG tablet Take 10 mg by mouth daily.    [provider]  Multiple Vitamin (MULTIVITAMIN WITH MINERALS) TABS Take 1 tablet by mouth daily.    [provider]  nystatin (MYCOSTATIN) 100000 UNIT/ML suspension Take 5 mLs by mouth daily as needed (FLARES, LICHEN PLANTIS).    [provider]  nystatin cream (MYCOSTATIN) Apply 1 application topically 2 (two) times daily as needed for dry skin.    [provider]  omeprazole (PRILOSEC) 20 MG capsule Take 1 capsule (20 mg total) by mouth daily. 11/18/18   Horton, Barbette Hair, MD  ondansetron (ZOFRAN ODT) 4 MG disintegrating tablet Take 1 tablet (4 mg total) by mouth every 8 (eight) hours as needed for nausea or vomiting. 11/18/18   Horton, Barbette Hair, MD  phenazopyridine (PYRIDIUM) 200 MG tablet Take 200 mg by mouth 3 (three) times daily as needed for pain Stark [place]ock TRACT PAIN).    [provider]  polyethylene glycol (MIRALAX / GLYCOLAX) 17 g packet Take 17 g by mouth 2 (two) times daily. 11/30/19   Barb Merino, MD  potassium chloride SA (KLOR-CON M20) 20 MEQ tablet TAKE 1&1/2 TABLETS BY MOUTH DAILY. 02/16/20   Martinique, Peter M, MD  PREVIDENT 5000 DRY MOUTH 1.1 % GEL dental gel Place 1 application onto teeth at bedtime.  Patient not taking: Reported on 04/30/2020 09/01/14   [provider]  Probiotic Product (ALIGN PO) Take 1 capsule by mouth 2 (two) times daily.    [provider]  senna-docusate (SENOKOT-S) 8.6-50 MG tablet Take 1 tablet by mouth 2 (two) times daily. 11/30/19   Barb Merino, MD   simvastatin (ZOCOR) 40 MG tablet Take 40 mg by mouth daily.     [provider]  valACYclovir (VALTREX) 500 MG tablet INCREASE TO TAKING 1 TABLET 2 TIMES A DAY FOR 3 DAYS WITH OUTBREAK Patient taking differently: Take 500 mg by mouth daily. 03/25/16   Kem Boroughs, FNP  zolpidem (AMBIEN) 10 MG tablet Take 5-10 mg by mouth at bedtime as needed for sleep.  Patient not taking: Reported on 04/30/2020 07/15/17   [provider]    Allergies    Hydrocodone, Hydrocodone-acetaminophen, and Tramadol  Review of Systems   Review of Systems  Neurological: Positive for dizziness.  All other systems reviewed and are negative.   Physical Exam Updated Vital Signs BP 139/66 (BP Location: Right Arm)   Pulse (!) 56   Temp 98.2 F (36.8 C)   Resp 15   SpO2 98%   Physical Exam Vitals and nursing note reviewed.  Constitutional:      General: She is not in acute distress.    Appearance: She is well-developed. She is not ill-appearing.  HENT:     Head: Normocephalic and atraumatic.     Right Ear: Tympanic membrane, ear canal and external ear normal.  Left Ear: There is impacted cerumen.  Eyes:     Conjunctiva/sclera: Conjunctivae normal.  Cardiovascular:     Rate and Rhythm: Normal rate and regular rhythm.  Pulmonary:     Effort: Pulmonary effort is normal. No respiratory distress.     Breath sounds: Normal breath sounds.  Abdominal:     General: Bowel sounds are normal.     Palpations: Abdomen is soft.     Tenderness: There is no abdominal tenderness.  Skin:    General: Skin is warm.  Neurological:     Mental Status: She is alert.     Comments: Mental Status:  Alert, oriented, thought content appropriate, able to give a coherent history. Speech fluent without evidence of aphasia. Able to follow 2 step commands without difficulty.  Cranial Nerves:  II:  pupils equal, round, reactive to light III,IV, VI: ptosis not present, extra-ocular motions intact bilaterally   V,VII: smile symmetric, facial light touch sensation equal VIII: hearing grossly normal to voice  X: uvula elevates symmetrically  XI: bilateral shoulder shrug symmetric and strong XII: midline tongue extension without fassiculations Motor:  Normal tone. 5/5 strength in upper and lower extremities bilaterally including strong and equal grip strength and dorsiflexion/plantar flexion Sensory: grossly normal in all extremities.  Cerebellar: normal finger-to-nose with bilateral upper extremities Gait: normal gait and balance CV: distal pulses palpable throughout    Psychiatric:        Behavior: Behavior normal.     ED Results / Procedures / Treatments   Labs (all labs ordered are listed, but only abnormal results are displayed) Labs Reviewed - No data to display  EKG EKG Interpretation  Date/Time:  Wednesday May 02 2020 13:30:52 EDT Ventricular Rate:  68 PR Interval:  177 QRS Duration: 98 QT Interval:  383 QTC Calculation: 408 R Axis:   -61 Text Interpretation: Sinus rhythm Left anterior fascicular block Borderline T abnormalities, anterior leads: T wave flattening and inversions No acute ischemia Similar to prior Confirmed by Lorre Munroe ([id number]) on 05/02/2020 3:12:52 PM    Radiology No results found.  Procedures Procedures   Medications Ordered in ED Medications - No data to display  ED Course  I have reviewed the triage vital signs and the nursing notes.  Pertinent labs & imaging results that were available during my care of the patient were reviewed by me and considered in my medical decision making (see chart for details).    MDM Rules/Calculators/A&P                          Patient is a 70 year old female with history of peripheral vertigo, presenting for evaluation of dizziness that has been intermittent over the last few days.  Overall dizziness feels similar to prior episodes, however she was concerned because she is also having high blood pressure over the  last few days which she has plan to follow-up with cardiology for medication management.  She has been taking her blood pressure as prescribed.  Symptoms occur with movement of her head, improved with rest.  She also treated with meclizine today and is asymptomatic on evaluation.  No vertical or rotational nystagmus. Patient normal finger-nose and normal gait.  No slurred speech or weakness.  Doubt CVA or other central cause of vertigo.  History and physical consistent with peripheral vertigo symptoms.  Had shared decision making with patient regarding additional work-up though believe this is not indicated today. Patient feels comfortable with d/c to home.  States she has seen ENT in the past, plans to schedule follow-up appointment.  Will prescribe refill of her meclizine. They are to return to the emergency department for new neurologic symptoms, persisting dizziness, loss of vision or other concerning symptoms.  Discussed with attending physician, Dr. Joya Gaskins, who is in agreement with care plan for discharge.   Final Clinical Impression(s) / ED Diagnoses Final diagnoses:  Vertigo    Rx / DC Orders ED Discharge Orders         Ordered    meclizine (ANTIVERT) 25 MG tablet  3 times daily PRN        05/02/20 1529           Najeh Credit, Martinique N, PA-C 05/02/20 1529    Arnaldo Natal, MD 05/02/20 2320

## 2020-05-02 NOTE — Telephone Encounter (Signed)
FYI--Patient returned call and per Dr. Doug Sou recommendation I scheduled her for 05/22/20 with Sande Rives, [place]. Please advise if patient is needing to be seen sooner.

## 2020-05-02 NOTE — ED Notes (Signed)
ED Provider at bedside. 

## 2020-05-02 NOTE — Telephone Encounter (Signed)
Called patient left message on personal voice mail to call me back. 

## 2020-05-02 NOTE — Telephone Encounter (Signed)
Agree with recommendations. If she would like to be seen for management of BP can schedule her for visit here with me or APP  Daimien Patmon Martinique MD, [facility]

## 2020-05-02 NOTE — ED Triage Notes (Signed)
Pt reports dizziness x 1 months with hx of "vertigo"-states "feels different" started 1-3 days ago-to triage in w/c-NAD

## 2020-05-02 NOTE — Discharge Instructions (Signed)
Please follow closely with your PCP or ENT.  You can take the meclizine every 8 hours as needed for dizziness.  Return to the ED if you develop new or worsening symptoms, or persistent dizziness.

## 2020-05-12 NOTE — Progress Notes (Signed)
Cardiology Office Note:    Date:  05/22/2020   ID:  FRANCHON Peters, DOB November 06, 1950, MRN [id number]  PCP:  Aletha Halim., PA-C  Cardiologist:  Peter Martinique, MD  Electrophysiologist:  None   Referring MD: Aletha Halim., PA-C   Chief Complaint: routine follow-up of palpitations or hypertension  History of Present Illness:    Cynthia Peters is a 70 y.o. female with a history of palpitations with PVCs noted on monitor in 2017, thoracic aortic aneurysm, hypertension, hyperlipidemia, TIA in 2013, GERD, IBS, and vertigo who is followed by Dr. Martinique and presents today for routine follow-up.  Patient has a history of palpitations. Monitor in 2017 showed sinus rhythm with rare PVCs. Palpitations ultimately improved with increase in beta-blocker. Patient was last seen by Roby Lofts, PA-C, for a virtual visit in 11/2019 at which time she was doing well from a cardiac standpoint. Echo was ordered for routine monitoring of thoracic aorta and showed LVEF of 60-65% with normal wall motion, grade 1 diastolic dysfunction, mild AI, and mild dilatation of ascending aorta measuring 18mm (stable from prior Echo in 2018).  She was recently seen in the ED on 05/03/2020 for dizziness similar to episodes of vertigo in the past. Improved with Meclizine. Patient was also concerned about elevated BP recently. However, BP 139/66 in the ED. EKG showed sinus rhythm with no acute ischemic changes. Dizziness felt to be secondary to vertigo.  Patient presents today for follow-up. Here alone. Patient reports her palpitation are well controlled on Toprol. She continues to have occasional dizziness with her Vertigo but no syncope She denies any new or worsening shortness of breath. No orthopnea or PND. She occasionally has some mild lower extremity edema if she has been on her feet a lot that day. She initially denied any chest pain. However, then at the end of the visit she noted a couple of rare episodes of  very atypical chest discomfort that she described as a "tightness" that occurred after sitting down. She states it felt like she sat down too hard. This has occurred about 3 times in her "whole adult life" and none recently. No exertional chest pain. Reassured her that this did not sound like cardiac chest pain. However, I got asked to go back and talk with patient in the hall after patient left and patient reported occasional left shoulder/arm pain. She states it feel like a muscle ache and occurs at rest. Not associated with activity and seems to improve with stretching of her arm. Sounds musculoskeletal but patient concerned given history of thoracic aortic aneurysm.  Past Medical History:  Diagnosis Date  . Anemia    history in the past, not on a regular basis  . Arthritis   . Bronchitis   . Cataracts, bilateral    immature  . Claustrophobia    takes Valium daily as needed  . Complication of anesthesia    2013 SURGERY vocal cord bothering pt, used smaller ent tube after 2 hernia repair, no problem after . " I woke up during my MRI and colonoscopy."       . Depression    takes Wellbutrin daily  . Dry eye   . Dry mouth   . GERD (gastroesophageal reflux disease)    takes Omeprazole daily   . H/O hiatal hernia   . Headache    migraines with flashing lights   . History of blood clots 40 yrs    leg   . History of echocardiogram  2013   a. Echo (05/2011): Mild LVH, EF 65-70%, no WMA, Gr 1 DD, mild AI.  Marland Kitchen History of MRSA infection   . History of staph infection   . History of stress test 2010   a. ETT-Echo in 2010 was normal  . History of TIA (transient ischemic attack) 2013   a. Carotid US (05/2011): No ICA stenosis, pt unsure of this  . Hyperlipidemia    takes Simvastatin daily   . Hypertension    takes  Losartan daily  . IBS (irritable bowel syndrome)    takes Bentyl daily as needed  . Insomnia    takes Ambien nightly  . Interstitial cystitis   . Lichen planus   . Mild asthma     Albuterol inhaler as needed  . Palpitations    Event monitor in 2007 with rare PVCs // takes Metoprolol daily  . PVC (premature ventricular contraction)   . Seasonal allergies    takes Claritin daily as needed.Takes Singulair daily as needed.  Marland Kitchen TIA (transient ischemic attack) 05/27/2011  . Urethral stricture   . Vertigo    takes Meclizine daily as needed    Past Surgical History:  Procedure Laterality Date  . CESAREAN SECTION    . COLONOSCOPY WITH PROPOFOL N/A 08/10/2012   Procedure: COLONOSCOPY WITH PROPOFOL;  Surgeon: Cynthia Fair, MD;  Location: [facility];  Service: Endoscopy;  Laterality: N/A;  . CYSTOSCOPY WITH URETHRAL DILATATION N/A 02/18/2013   Procedure: CYSTOSCOPY WITH URETHRAL DILATATION ( NO BALLOON) AND HYDRODISTENSION;  Surgeon: Cynthia Frock, MD;  Location: [facility];  Service: Urology;  Laterality: N/A;  . ENDOMETRIAL ABLATION W/ HYDROTHERMABLATOR  09-27-2002  . INCISIONAL HERNIA REPAIR N/A 10/25/2014   Procedure: HERNIA REPAIR INCISIONAL;  Surgeon: Cynthia Ok, MD;  Location: [place];  Service: General;  Laterality: N/A;  . INCISIONAL HERNIA REPAIR N/A 03/26/2015   Procedure: LAPAROSCOPIC INCISIONAL HERNIA;  Surgeon: Cynthia Ok, MD;  Location: [place];  Service: General;  Laterality: N/A;  . INSERTION OF MESH N/A 10/25/2014   Procedure: INSERTION OF MESH;  Surgeon: Cynthia Ok, MD;  Location: [place];  Service: General;  Laterality: N/A;  . LAPAROSCOPIC LYSIS OF ADHESIONS N/A 03/26/2015   Procedure: LAPAROSCOPIC LYSIS OF ADHESIONS;  Surgeon: Cynthia Ok, MD;  Location: [place];  Service: General;  Laterality: N/A;  . LAPAROTOMY N/A 10/25/2014   Procedure: EXPLORATORY LAPAROTOMY;  Surgeon: Cynthia Ok, MD;  Location: [place];  Service: General;  Laterality: N/A;  . LYSIS OF ADHESION N/A 10/25/2014   Procedure: LYSIS OF ADHESIONS ;  Surgeon: Cynthia Ok, MD;  Location: [place];  Service: General;  Laterality: N/A;  . MUCOSAL ADVANCEMENT  FLAP N/A 10/25/2014   Procedure: MUCOSAL ADVANCEMENT FLAP;  Surgeon: Cynthia Ok, MD;  Location: [place];  Service: General;  Laterality: N/A;  . ORIF RADIAL FRACTURE Right 06/17/2016   Procedure: right radial head arthroplasty with ligament repair, reconstruciton;  Surgeon: Roseanne Kaufman, MD;  Location: [place];  Service: Orthopedics;  Laterality: Right;  . RADIAL HEAD ARTHROPLASTY Right 06/17/2016  . TOOTH EXTRACTION    . TRANSTHORACIC ECHOCARDIOGRAM  05-26-2011   MILD LVH/  EF 73-71%/  GRADE I DIASTOLIC DYSFUNCTION/  MILD AR//   06-20-2008  NOMRAL STRESS ECHO  . UMBILICAL HERNIA REPAIR  2013   AND REVISION THE SAME YEAR W/ MESH  . WISDOM TOOTH EXTRACTION  AGE 89    Current Medications: Current Meds  Medication Sig  . acetaminophen (TYLENOL) 500 MG tablet Take 1,000  mg by mouth every 6 (six) hours as needed (pain).   Marland Kitchen albuterol (PROVENTIL HFA;VENTOLIN HFA) 108 (90 BASE) MCG/ACT inhaler Inhale 2 puffs into the lungs every 6 (six) hours as needed for wheezing or shortness of breath.  Marland Kitchen amLODipine (NORVASC) 2.5 MG tablet TAKE 1 TABLET BY MOUTH TWICE A DAY (Patient taking differently: 5 mg daily.)  . aspirin 81 MG chewable tablet Chew 81 mg by mouth daily.  Marland Kitchen buPROPion (WELLBUTRIN XL) 300 MG 24 hr tablet Take 300 mg by mouth daily.  . Cholecalciferol (VITAMIN D) 2000 units CAPS Take 2,000 Units by mouth daily.  . clobetasol cream (TEMOVATE) 5.95 % Apply 1 application topically 2 (two) times daily as needed (rash).   . Coenzyme Q10 (CO Q 10) 100 MG CAPS Take 100 mg by mouth daily.   . CVS VITAMIN C 1000 MG tablet Take 2,000 mg by mouth 2 (two) times daily.   Marland Kitchen dicyclomine (BENTYL) 10 MG capsule Take 10 mg by mouth every 4 (four) hours as needed (for abdominal cramps).   . Evening Primrose Oil 1000 MG CAPS Take 1,000 mg by mouth daily.  Marland Kitchen Fexofenadine HCl (ALLEGRA ALLERGY PO) 1 tablet  . Fexofenadine HCl (ALLEGRA PO) Take 1 tablet by mouth daily as needed (ALLERGIES).  . Flaxseed, Linseed,  1000 MG CAPS Take 1,000 mg by mouth 2 (two) times daily.  . fluticasone (FLONASE) 50 MCG/ACT nasal spray Place 2 sprays into both nostrils daily as needed for allergies or rhinitis.   . hydrochlorothiazide 25 MG tablet Take 25 mg by mouth daily.  Marland Kitchen HYDROcodone-acetaminophen (NORCO) 10-325 MG tablet hydrocodone 10 mg-acetaminophen 325 mg tablet  TAKE 1 TABLET BY MOUTH EVERY 6 HOURS AS NEEDED FOR PAIN  . HYDROCORTISONE ACE, RECTAL, 30 MG SUPP Place 30 mg rectally 2 (two) times daily as needed for hemorrhoids.  Marland Kitchen ibuprofen (ADVIL,MOTRIN) 200 MG tablet Take 400-600 mg by mouth 2 (two) times daily as needed (pain).   Marland Kitchen ketotifen (ZADITOR) 0.025 % ophthalmic solution 1 drop into affected eye  . losartan (COZAAR) 100 MG tablet TAKE 1 TABLET BY MOUTH EVERY DAY (Patient taking differently: Take 100 mg by mouth daily.)  . magnesium oxide (MAG-OX) 400 MG tablet 1 tablet  . meclizine (ANTIVERT) 25 MG tablet Take 1 tablet (25 mg total) by mouth 3 (three) times daily as needed for dizziness.  . metoprolol succinate (TOPROL-XL) 100 MG 24 hr tablet Take 1 tablet (100 mg total) by mouth 2 (two) times daily. Take with or immediately following a meal. Take 100 mg by mouth 2 (two) times daily. Take with or immediately following a meal.  . montelukast (SINGULAIR) 10 MG tablet Take 10 mg by mouth daily.  . Multiple Vitamins-Minerals (MULTIPLE VITAMINS/WOMENS PO) 1 tablet  . nystatin (MYCOSTATIN) 100000 UNIT/ML suspension Take 5 mLs by mouth daily as needed (FLARES, LICHEN PLANTIS).  Marland Kitchen nystatin cream (MYCOSTATIN) Apply 1 application topically 2 (two) times daily as needed for dry skin.  Marland Kitchen omeprazole (PRILOSEC) 20 MG capsule Take 1 capsule (20 mg total) by mouth daily.  . ondansetron (ZOFRAN ODT) 4 MG disintegrating tablet Take 1 tablet (4 mg total) by mouth every 8 (eight) hours as needed for nausea or vomiting.  . pantoprazole (PROTONIX) 20 MG tablet Take 1 tablet by mouth daily.  . phenazopyridine (PYRIDIUM) 200 MG  tablet Take 200 mg by mouth 3 (three) times daily as needed for pain [place] TRACT PAIN).  Marland Kitchen polyethylene glycol (MIRALAX / GLYCOLAX) 17 g packet Take 17 g  by mouth 2 (two) times daily.  . Potassium Chloride ER 20 MEQ TBCR 1 1/2 tablets with food  . potassium chloride SA (KLOR-CON M20) 20 MEQ tablet TAKE 1&1/2 TABLETS BY MOUTH DAILY.  . pravastatin (PRAVACHOL) 40 MG tablet Take 1 tablet by mouth daily.  Marland Kitchen PREVIDENT 5000 DRY MOUTH 1.1 % GEL dental gel Place 1 application onto teeth at bedtime.  . Probiotic Product (ALIGN PO) Take 1 capsule by mouth 2 (two) times daily.  Marland Kitchen senna-docusate (SENOKOT-S) 8.6-50 MG tablet Take 1 tablet by mouth 2 (two) times daily.  . valACYclovir (VALTREX) 500 MG tablet INCREASE TO TAKING 1 TABLET 2 TIMES A DAY FOR 3 DAYS WITH OUTBREAK (Patient taking differently: Take 500 mg by mouth daily.)  . zolpidem (AMBIEN) 10 MG tablet Take 5-10 mg by mouth at bedtime as needed for sleep.  . [DISCONTINUED] simvastatin (ZOCOR) 40 MG tablet Take 40 mg by mouth daily.      Allergies:   Hydrocodone, Hydrocodone-acetaminophen, and Tramadol   Social History   Socioeconomic History  . Marital status: Divorced    Spouse name: Not on file  . Number of children: 2  . Years of education: Not on file  . Highest education level: Not on file  Occupational History  . Occupation: Pharmacist, hospital  Tobacco Use  . Smoking status: Former Smoker    Packs/day: 0.25    Years: 2.00    Pack years: 0.50    Types: Cigarettes    Quit date: 02/04/1976    Years since quitting: 44.3  . Smokeless tobacco: Never Used  Vaping Use  . Vaping Use: Never used  Substance and Sexual Activity  . Alcohol use: Yes    Comment: occasional  . Drug use: No  . Sexual activity: Not on file  Other Topics Concern  . Not on file  Social History Narrative  . Not on file   Social Determinants of Health   Financial Resource Strain: Not on file  Food Insecurity: Not on file  Transportation Needs: Not on file   Physical Activity: Not on file  Stress: Not on file  Social Connections: Not on file     Family History: The patient's family history includes Cirrhosis in her father; Dementia in her mother; Leukemia in her child.  ROS:   Please see the history of present illness.     EKGs/Labs/Other Studies Reviewed:    The following studies were reviewed today:  Holter Monitor 09/2015:  Normal sinus rhythm  Rare isolated PVCs. Total of 35 in 48 hours  Diary events occur with and without PVCs. _______________  Echocardiogram 12/09/2019: Impressions: 1. Left ventricular ejection fraction, by estimation, is 60 to 65%. The  left ventricle has normal function. The left ventricle has no regional  wall motion abnormalities. There is mild concentric left ventricular  hypertrophy. Left ventricular diastolic  parameters are consistent with Grade I diastolic dysfunction (impaired  relaxation).  2. Right ventricular systolic function is normal. The right ventricular  size is normal.  3. The mitral valve is normal in structure. Trivial mitral valve  regurgitation.  4. The aortic valve is normal in structure. There is mild thickening of  the aortic valve. Aortic valve regurgitation is mild.  5. There is mild dilatation of the ascending aorta, measuring 40 mm.   Comparison(s): Compared to prior TTE in 2018, there is no significant  change. The size of the ascending aorta remains stable at 55mm.   EKG:  EKG not ordered today.  Recent Labs: 11/30/2019: ALT 28; BUN 13; Creatinine, Ser 0.96; Hemoglobin 14.0; Magnesium 2.1; Platelets 256; Potassium 3.8; Sodium 135  Recent Lipid Panel    Component Value Date/Time   CHOL 192 05/26/2011 1245   TRIG 122 05/26/2011 1245   HDL 65 05/26/2011 1245   CHOLHDL 3.0 05/26/2011 1245   VLDL 24 05/26/2011 1245   LDLCALC 103 (H) 05/26/2011 1245    Physical Exam:    Vital Signs: BP (!) 162/74   Pulse 68   Ht 5\' 4"  (1.626 m)   Wt 167 lb 3.2 oz (75.8  kg)   SpO2 98%   BMI 28.70 kg/m     Wt Readings from Last 3 Encounters:  05/22/20 167 lb 3.2 oz (75.8 kg)  04/30/20 167 lb (75.8 kg)  11/27/19 175 lb 4.3 oz (79.5 kg)     General: 70 y.o. female in no acute distress. HEENT: Normocephalic and atraumatic. Sclera clear. EOMs intact. Neck: Supple. No JVD. Heart: RRR. Distinct S1 and S2. No significant murmurs, gallops, or rubs.  Lungs: No increased work of breathing. Clear to ausculation bilaterally. No wheezes, rhonchi, or rales.  Abdomen: Soft, non-distended, and non-tender to palpation.  MSK: Normal strength and tone for age.  Extremities: No lower extremity edema.    Skin: Warm and dry. Neuro: Alert and oriented x3. No focal deficits. Psych: Normal affect. Responds appropriately.  Assessment:    1. Left arm pain   2. Chest tightness   3. Palpitations   4. Primary hypertension   5. Hyperlipidemia, unspecified hyperlipidemia type   6. Thoracic aortic aneurysm without rupture ([place])     Plan:    Left Arm Pain Rate Chest Tightness - Patient initially denied any chest pain. However, then at end of visit she reported very rare episodes of atypical chest tightness at rest (3 episodes throughout entire adult life and none recently). I was then called to see patient in the hall after visit and she remembered that she has been having occasional left arm pain as well. It sounds musculoskeletal in nature but did discuss that sometimes this can be an anginal equivalent. This is what the patient is concerned about. Discussed coronary CTA to rule out CAD and patient would like to proceed with this. Heart rates normally in the low 60's on current dose of Toprol-XL which should be acceptable for study. Patient is scheduled to have labs later this week at PCP's office and have asked that she fax [place] the results (sounds like she will be having a metabolic panel done to assess her kidney function).  Palpitations PVCs - Monitor in 2017 showed PVCs.   - Well controlled on beta-blocker.  - Continue Toprol-XL 100mg  daily.   Hypertension - BP elevated at 162/74 in the office. However, patient keeps a close log of her BP and it is mostly well controlled (< 130/80) with only occasional readings with systolic BP in the 211'H. Patient has a family member who is a Marine scientist and confirmed that home cuff is accurate.  - Continue current medications: Amlodipine 5mg  daily, HCTZ 25mg  daily, Losartan 100mg  daily, Toprol-XL 100mg  daily.   Hyperlipidemia - Last lipid panel from 11/2018: Total Cholesterol 172, Triglycerides 178, HDL 59, LDL 91.  - Continue Pravastatin 40mg  daily.  - Followed by PCP.   Thoracic Aortic Aneurysm - Recent Echo in 11/2019 showed stale mild dilatation of ascending aorta measuring 66mm. Unchanged from Echo in 2018. - Continue strict control of BP.  - Will need to continue routine  monitoring of things. Was initially planning on getting chest CTA before next follow-up visit in 6 months. However, given reports of left arm pain, will order coronary CTA as above. Aneurysm can be evaluated on this.  Disposition: Follow up in 6 months with Dr. Martinique.   Medication Adjustments/Labs and Tests Ordered: Current medicines are reviewed at length with the patient today.  Concerns regarding medicines are outlined above.  Orders Placed This Encounter  Procedures  . CT CORONARY MORPH W/CTA COR W/SCORE W/CA W/CM &/OR WO/CM  . CT CORONARY FRACTIONAL FLOW RESERVE DATA PREP  . CT CORONARY FRACTIONAL FLOW RESERVE FLUID ANALYSIS  . Basic Metabolic Panel (BMET)   No orders of the defined types were placed in this encounter.   Patient Instructions  Medication Instructions:  Continue current medications  *If you need a refill on your cardiac medications before your next appointment, please call your pharmacy*   Lab Work: BMP prior to your CTA  If you have labs (blood work) drawn today and your tests are completely normal, you will receive  your results only by: Marland Kitchen MyChart Message (if you have MyChart) OR . A paper copy in the mail If you have any lab test that is abnormal or we need to change your treatment, we will call you to review the results.   Testing/Procedures: Non-Cardiac CT Angiography (CTA), is a special type of CT scan that uses a computer to produce multi-dimensional views of major blood vessels throughout the body. In CT angiography, a contrast material is injected through an IV to help visualize the blood vessels   Follow-Up: At [facility], you and your health needs are our priority.  As part of our continuing mission to provide you with exceptional heart care, we have created designated Provider Care Teams.  These Care Teams include your primary Cardiologist (physician) and Advanced Practice Providers (APPs -  Physician Assistants and Nurse Practitioners) who all work together to provide you with the care you need, when you need it.  We recommend signing up for the patient portal called "MyChart".  Sign up information is provided on this After Visit Summary.  MyChart is used to connect with patients for Virtual Visits (Telemedicine).  Patients are able to view lab/test results, encounter notes, upcoming appointments, etc.  Non-urgent messages can be sent to your provider as well.   To learn more about what you can do with MyChart, go to [email or web address].    Your next appointment:   6 month(s)  The format for your next appointment:   In Person  Provider:   You may see Peter Martinique, MD or one of the following Advanced Practice Providers on your designated Care Team:    Almyra Deforest, PA-C  Fabian Sharp, PA-C or   Roby Lofts, [place]     OTHER:  Your cardiac CT will be scheduled at one of the below locations:   [facility] [address] [place], [place] [address] [phone]  [place] [address] [place], [place]  [address] [phone]  If scheduled at [facility], please arrive at the [facility] main entrance (entrance A) of [facility] 30 minutes prior to test start time. Proceed to the [facility] Radiology Department (first floor) to check-in and test prep.  If scheduled at [facility], please arrive 15 mins early for check-in and test prep.  Please follow these instructions carefully (unless otherwise directed):   On  the Night Before the Test: . Be sure to Drink plenty of water. . Do not consume any caffeinated/decaffeinated beverages or chocolate 12 hours prior to your test. . Do not take any antihistamines 12 hours prior to your test.   On the Day of the Test: . Drink plenty of water until 1 hour prior to the test. . Do not eat any food 4 hours prior to the test. . You may take your regular medications prior to the test.  . Take metoprolol (Lopressor) two hours prior to test. . HOLD Hydrochlorothiazide morning of the test. . FEMALES- please wear underwire-free bra if available       After the Test: . Drink plenty of water. . After receiving IV contrast, you may experience a mild flushed feeling. This is normal. . On occasion, you may experience a mild rash up to 24 hours after the test. This is not dangerous. If this occurs, you can take Benadryl 25 mg and increase your fluid intake. . If you experience trouble breathing, this can be serious. If it is severe call 911 IMMEDIATELY. If it is mild, please call our office. . If you take any of these medications: Glipizide/Metformin, Avandament, Glucavance, please do not take 48 hours after completing test unless otherwise instructed.   Once we have confirmed authorization from your insurance company, we will call you to set up a date and time for your test. Based on how quickly your insurance processes prior authorizations requests, please allow up to 4 weeks to be contacted for scheduling your  Cardiac CT appointment. Be advised that routine Cardiac CT appointments could be scheduled as many as 8 weeks after your provider has ordered it.  For non-scheduling related questions, please contact the cardiac imaging nurse navigator should you have any questions/concerns: Marchia Bond, Cardiac Imaging Nurse Navigator Gordy Clement, Cardiac Imaging Nurse Navigator [facility] Direct Office Dial: [phone]   For scheduling needs, including cancellations and rescheduling, please call [place], [phone].       Signed, Darreld Mclean, PA-C  05/22/2020 1:02 PM    [facility]

## 2020-05-21 DIAGNOSIS — F325 Major depressive disorder, single episode, in full remission: Secondary | ICD-10-CM | POA: Insufficient documentation

## 2020-05-22 ENCOUNTER — Ambulatory Visit: Payer: Medicare PPO | Admitting: Student

## 2020-05-22 ENCOUNTER — Encounter: Payer: Self-pay | Admitting: Student

## 2020-05-22 ENCOUNTER — Other Ambulatory Visit: Payer: Self-pay

## 2020-05-22 VITALS — BP 162/74 | HR 68 | Ht 64.0 in | Wt 167.2 lb

## 2020-05-22 DIAGNOSIS — I712 Thoracic aortic aneurysm, without rupture, unspecified: Secondary | ICD-10-CM

## 2020-05-22 DIAGNOSIS — R0789 Other chest pain: Secondary | ICD-10-CM | POA: Diagnosis not present

## 2020-05-22 DIAGNOSIS — M79602 Pain in left arm: Secondary | ICD-10-CM

## 2020-05-22 DIAGNOSIS — R002 Palpitations: Secondary | ICD-10-CM | POA: Diagnosis not present

## 2020-05-22 DIAGNOSIS — I493 Ventricular premature depolarization: Secondary | ICD-10-CM | POA: Diagnosis not present

## 2020-05-22 DIAGNOSIS — I10 Essential (primary) hypertension: Secondary | ICD-10-CM

## 2020-05-22 DIAGNOSIS — E785 Hyperlipidemia, unspecified: Secondary | ICD-10-CM

## 2020-05-22 MED ORDER — AMLODIPINE BESYLATE 2.5 MG PO TABS: 5.0000 mg | ORAL_TABLET | Freq: Every day | ORAL | Status: DC

## 2020-05-22 NOTE — Patient Instructions (Addendum)
Medication Instructions:  Continue current medications  *If you need a refill on your cardiac medications before your next appointment, please call your pharmacy*   Lab Work: BMP prior to your CTA  If you have labs (blood work) drawn today and your tests are completely normal, you will receive your results only by: Marland Kitchen MyChart Message (if you have MyChart) OR . A paper copy in the mail If you have any lab test that is abnormal or we need to change your treatment, we will call you to review the results.   Testing/Procedures: Non-Cardiac CT Angiography (CTA), is a special type of CT scan that uses a computer to produce multi-dimensional views of major blood vessels throughout the body. In CT angiography, a contrast material is injected through an IV to help visualize the blood vessels   Follow-Up: At [facility], you and your health needs are our priority.  As part of our continuing mission to provide you with exceptional heart care, we have created designated Provider Care Teams.  These Care Teams include your primary Cardiologist (physician) and Advanced Practice Providers (APPs -  Physician Assistants and Nurse Practitioners) who all work together to provide you with the care you need, when you need it.  We recommend signing up for the patient portal called "MyChart".  Sign up information is provided on this After Visit Summary.  MyChart is used to connect with patients for Virtual Visits (Telemedicine).  Patients are able to view lab/test results, encounter notes, upcoming appointments, etc.  Non-urgent messages can be sent to your provider as well.   To learn more about what you can do with MyChart, go to [email or web address].    Your next appointment:   6 month(s)  The format for your next appointment:   In Person  Provider:   You may see Peter Martinique, MD or one of the following Advanced Practice Providers on your designated Care Team:    Almyra Deforest, PA-C  Fabian Sharp, PA-C  or   Roby Lofts, [place]     OTHER:  Your cardiac CT will be scheduled at one of the below locations:   [facility] [address] [place], [place] [address] [phone]  [place] [address] [place], [place] [address] [phone]  If scheduled at [facility], please arrive at the [facility] main entrance (entrance A) of [facility] 30 minutes prior to test start time. Proceed to the [facility] Radiology Department (first floor) to check-in and test prep.  If scheduled at [facility], please arrive 15 mins early for check-in and test prep.  Please follow these instructions carefully (unless otherwise directed):   On the Night Before the Test: . Be sure to Drink plenty of water. . Do not consume any caffeinated/decaffeinated beverages or chocolate 12 hours prior to your test. . Do not take any antihistamines 12 hours prior to your test.   On the Day of the Test: . Drink plenty of water until 1 hour prior to the test. . Do not eat any food 4 hours prior to the test. . You may take your regular medications prior to the test.  . Take metoprolol (Lopressor) two hours prior to test. . HOLD Hydrochlorothiazide morning of the test. . FEMALES- please wear underwire-free bra if available       After the Test: . Drink plenty of water. . After receiving IV contrast, you may experience a mild  flushed feeling. This is normal. . On occasion, you may experience a mild rash up to 24 hours after the test. This is not dangerous. If this occurs, you can take Benadryl 25 mg and increase your fluid intake. . If you experience trouble breathing, this can be serious. If it is severe call 911 IMMEDIATELY. If it is mild, please call our office. . If you take any of these medications: Glipizide/Metformin, Avandament, Glucavance, please do not take 48 hours after completing test  unless otherwise instructed.   Once we have confirmed authorization from your insurance company, we will call you to set up a date and time for your test. Based on how quickly your insurance processes prior authorizations requests, please allow up to 4 weeks to be contacted for scheduling your Cardiac CT appointment. Be advised that routine Cardiac CT appointments could be scheduled as many as 8 weeks after your provider has ordered it.  For non-scheduling related questions, please contact the cardiac imaging nurse navigator should you have any questions/concerns: Marchia Bond, Cardiac Imaging Nurse Navigator Gordy Clement, Cardiac Imaging Nurse Navigator [facility] Direct Office Dial: [phone]   For scheduling needs, including cancellations and rescheduling, please call [place], [phone].

## 2020-06-11 ENCOUNTER — Telehealth (HOSPITAL_COMMUNITY): Payer: Self-pay | Admitting: *Deleted

## 2020-06-11 NOTE — Telephone Encounter (Signed)
Attempted to call patient regarding upcoming cardiac CT appointment. °Left message on voicemail with name and callback number ° °Enma Maeda RN Navigator Cardiac Imaging °[facility] °[phone] Office °[phone] Cell ° °

## 2020-06-12 ENCOUNTER — Encounter (HOSPITAL_COMMUNITY): Payer: Self-pay

## 2020-06-12 ENCOUNTER — Ambulatory Visit (HOSPITAL_COMMUNITY): Admission: RE | Admit: 2020-06-12 | Payer: Medicare PPO | Source: Ambulatory Visit

## 2020-06-29 DIAGNOSIS — M19029 Primary osteoarthritis, unspecified elbow: Secondary | ICD-10-CM | POA: Insufficient documentation

## 2020-07-01 ENCOUNTER — Inpatient Hospital Stay (HOSPITAL_BASED_OUTPATIENT_CLINIC_OR_DEPARTMENT_OTHER)
Admission: EM | Admit: 2020-07-01 | Discharge: 2020-07-02 | DRG: 390 | Disposition: A | Discharge status: Home / Self Care | Payer: Medicare Other | Attending: Family Medicine | Admitting: Family Medicine

## 2020-07-01 ENCOUNTER — Other Ambulatory Visit: Payer: Self-pay

## 2020-07-01 ENCOUNTER — Encounter (HOSPITAL_BASED_OUTPATIENT_CLINIC_OR_DEPARTMENT_OTHER): Payer: Self-pay | Admitting: *Deleted

## 2020-07-01 ENCOUNTER — Emergency Department (HOSPITAL_BASED_OUTPATIENT_CLINIC_OR_DEPARTMENT_OTHER): Payer: Medicare Other

## 2020-07-01 DIAGNOSIS — R682 Dry mouth, unspecified: Secondary | ICD-10-CM | POA: Diagnosis not present

## 2020-07-01 DIAGNOSIS — I10 Essential (primary) hypertension: Secondary | ICD-10-CM | POA: Diagnosis not present

## 2020-07-01 DIAGNOSIS — K56609 Unspecified intestinal obstruction, unspecified as to partial versus complete obstruction: Secondary | ICD-10-CM | POA: Diagnosis present

## 2020-07-01 DIAGNOSIS — Z7982 Long term (current) use of aspirin: Secondary | ICD-10-CM | POA: Diagnosis not present

## 2020-07-01 DIAGNOSIS — Z885 Allergy status to narcotic agent status: Secondary | ICD-10-CM

## 2020-07-01 DIAGNOSIS — K56600 Partial intestinal obstruction, unspecified as to cause: Secondary | ICD-10-CM | POA: Diagnosis not present

## 2020-07-01 DIAGNOSIS — J45909 Unspecified asthma, uncomplicated: Secondary | ICD-10-CM | POA: Diagnosis not present

## 2020-07-01 DIAGNOSIS — E785 Hyperlipidemia, unspecified: Secondary | ICD-10-CM | POA: Diagnosis present

## 2020-07-01 DIAGNOSIS — K219 Gastro-esophageal reflux disease without esophagitis: Secondary | ICD-10-CM | POA: Diagnosis not present

## 2020-07-01 DIAGNOSIS — G4700 Insomnia, unspecified: Secondary | ICD-10-CM | POA: Diagnosis not present

## 2020-07-01 DIAGNOSIS — Z86718 Personal history of other venous thrombosis and embolism: Secondary | ICD-10-CM | POA: Diagnosis not present

## 2020-07-01 DIAGNOSIS — Z8614 Personal history of Methicillin resistant Staphylococcus aureus infection: Secondary | ICD-10-CM | POA: Diagnosis not present

## 2020-07-01 DIAGNOSIS — Z20822 Contact with and (suspected) exposure to covid-19: Secondary | ICD-10-CM | POA: Diagnosis present

## 2020-07-01 DIAGNOSIS — F40240 Claustrophobia: Secondary | ICD-10-CM | POA: Diagnosis present

## 2020-07-01 DIAGNOSIS — K589 Irritable bowel syndrome without diarrhea: Secondary | ICD-10-CM | POA: Diagnosis present

## 2020-07-01 DIAGNOSIS — I493 Ventricular premature depolarization: Secondary | ICD-10-CM | POA: Diagnosis not present

## 2020-07-01 DIAGNOSIS — Z8673 Personal history of transient ischemic attack (TIA), and cerebral infarction without residual deficits: Secondary | ICD-10-CM | POA: Diagnosis not present

## 2020-07-01 DIAGNOSIS — Z87891 Personal history of nicotine dependence: Secondary | ICD-10-CM | POA: Diagnosis not present

## 2020-07-01 DIAGNOSIS — M1990 Unspecified osteoarthritis, unspecified site: Secondary | ICD-10-CM | POA: Diagnosis present

## 2020-07-01 DIAGNOSIS — Z79899 Other long term (current) drug therapy: Secondary | ICD-10-CM

## 2020-07-01 DIAGNOSIS — F32A Depression, unspecified: Secondary | ICD-10-CM | POA: Diagnosis present

## 2020-07-01 LAB — URINALYSIS, MICROSCOPIC (REFLEX)

## 2020-07-01 LAB — COMPREHENSIVE METABOLIC PANEL
ALT: 26 U/L (ref 0–44)
AST: 26 U/L (ref 15–41)
Albumin: 4.3 g/dL (ref 3.5–5.0)
Alkaline Phosphatase: 68 U/L (ref 38–126)
Anion gap: 10 (ref 5–15)
BUN: 26 mg/dL — ABNORMAL HIGH (ref 8–23)
CO2: 27 mmol/L (ref 22–32)
Calcium: 9.7 mg/dL (ref 8.9–10.3)
Chloride: 102 mmol/L (ref 98–111)
Creatinine, Ser: 0.87 mg/dL (ref 0.44–1.00)
GFR, Estimated: >60 mL/min (ref >60)
Glucose, Bld: 136 mg/dL — ABNORMAL HIGH (ref 70–99)
Potassium: 4 mmol/L (ref 3.5–5.1)
Sodium: 139 mmol/L (ref 135–145)
Total Bilirubin: 1.3 mg/dL — ABNORMAL HIGH (ref 0.3–1.2)
Total Protein: 7.1 g/dL (ref 6.5–8.1)

## 2020-07-01 LAB — CBC WITH DIFFERENTIAL/PLATELET
Abs Immature Granulocytes: 0.04 K/uL (ref 0.00–0.07)
Basophils Absolute: 0.1 K/uL (ref 0.0–0.1)
Basophils Relative: 1 %
Eosinophils Absolute: 0.5 K/uL (ref 0.0–0.5)
Eosinophils Relative: 4 %
HCT: 43.7 % (ref 36.0–46.0)
Hemoglobin: 15.1 g/dL — ABNORMAL HIGH (ref 12.0–15.0)
Immature Granulocytes: 0 %
Lymphocytes Relative: 9 %
Lymphs Abs: 1.2 K/uL (ref 0.7–4.0)
MCH: 32.3 pg (ref 26.0–34.0)
MCHC: 34.6 g/dL (ref 30.0–36.0)
MCV: 93.6 fL (ref 80.0–100.0)
Monocytes Absolute: 1 K/uL (ref 0.1–1.0)
Monocytes Relative: 8 %
Neutro Abs: 10 K/uL — ABNORMAL HIGH (ref 1.7–7.7)
Neutrophils Relative %: 78 %
Platelets: 266 K/uL (ref 150–400)
RBC: 4.67 MIL/uL (ref 3.87–5.11)
RDW: 12.6 % (ref 11.5–15.5)
WBC: 12.9 K/uL — ABNORMAL HIGH (ref 4.0–10.5)
nRBC: 0 % (ref 0.0–0.2)

## 2020-07-01 LAB — URINALYSIS, ROUTINE W REFLEX MICROSCOPIC
Bilirubin Urine: NEGATIVE
Glucose, UA: NEGATIVE mg/dL
Hgb urine dipstick: NEGATIVE
Ketones, ur: NEGATIVE mg/dL
Nitrite: NEGATIVE
Protein, ur: NEGATIVE mg/dL
Specific Gravity, Urine: 1.025 (ref 1.005–1.030)
pH: 7 (ref 5.0–8.0)

## 2020-07-01 LAB — LIPASE, BLOOD: Lipase: 30 U/L (ref 11–51)

## 2020-07-01 LAB — RESP PANEL BY RT-PCR (FLU A&B, COVID) ARPGX2
Influenza A by PCR: NEGATIVE
Influenza B by PCR: NEGATIVE
SARS Coronavirus 2 by RT PCR: NEGATIVE

## 2020-07-01 MED ORDER — FENTANYL CITRATE (PF) 100 MCG/2ML IJ SOLN
100.0000 ug | INTRAMUSCULAR | Status: DC | PRN
  Administered 2020-07-01: 100 ug via INTRAVENOUS
  Filled 2020-07-01: qty 2

## 2020-07-01 MED ORDER — RISAQUAD PO CAPS
1.0000 | ORAL_CAPSULE | Freq: Every day | ORAL | Status: DC
  Administered 2020-07-01 – 2020-07-02 (×2): 1 via ORAL
  Filled 2020-07-01 (×2): qty 1

## 2020-07-01 MED ORDER — FENTANYL CITRATE (PF) 100 MCG/2ML IJ SOLN
50.0000 ug | Freq: Once | INTRAMUSCULAR | Status: AC
  Administered 2020-07-01: 50 ug via INTRAVENOUS
  Filled 2020-07-01: qty 2

## 2020-07-01 MED ORDER — MECLIZINE HCL 25 MG PO TABS: 25.0000 mg | ORAL_TABLET | Freq: Three times a day (TID) | ORAL | Status: DC | PRN

## 2020-07-01 MED ORDER — BUPROPION HCL ER (XL) 300 MG PO TB24
300.0000 mg | ORAL_TABLET | Freq: Every day | ORAL | Status: DC
  Administered 2020-07-01 – 2020-07-02 (×2): 300 mg via ORAL
  Filled 2020-07-01 (×2): qty 1

## 2020-07-01 MED ORDER — LORATADINE 10 MG PO TABS
10.0000 mg | ORAL_TABLET | Freq: Every day | ORAL | Status: DC
  Administered 2020-07-02: 10 mg via ORAL
  Filled 2020-07-01: qty 1

## 2020-07-01 MED ORDER — POLYETHYLENE GLYCOL 3350 17 G PO PACK
17.0000 g | PACK | Freq: Every day | ORAL | Status: DC
  Administered 2020-07-02: 17 g via ORAL
  Filled 2020-07-01: qty 1

## 2020-07-01 MED ORDER — ONDANSETRON HCL 4 MG/2ML IJ SOLN
4.0000 mg | Freq: Once | INTRAMUSCULAR | Status: AC
  Administered 2020-07-01: 4 mg via INTRAVENOUS
  Filled 2020-07-01: qty 2

## 2020-07-01 MED ORDER — MAGNESIUM OXIDE 400 MG PO TABS
400.0000 mg | ORAL_TABLET | Freq: Every day | ORAL | Status: DC
  Filled 2020-07-01 (×2): qty 1

## 2020-07-01 MED ORDER — METOPROLOL SUCCINATE ER 50 MG PO TB24
100.0000 mg | ORAL_TABLET | Freq: Two times a day (BID) | ORAL | Status: DC
  Administered 2020-07-01 – 2020-07-02 (×2): 100 mg via ORAL
  Filled 2020-07-01 (×2): qty 2

## 2020-07-01 MED ORDER — ASPIRIN 81 MG PO CHEW
81.0000 mg | CHEWABLE_TABLET | ORAL | Status: DC
  Administered 2020-07-02: 81 mg via ORAL
  Filled 2020-07-01: qty 1

## 2020-07-01 MED ORDER — HYDROCHLOROTHIAZIDE 25 MG PO TABS
25.0000 mg | ORAL_TABLET | Freq: Every day | ORAL | Status: DC
  Administered 2020-07-02: 25 mg via ORAL
  Filled 2020-07-01: qty 1

## 2020-07-01 MED ORDER — ONDANSETRON HCL 4 MG/2ML IJ SOLN: 4.0000 mg | Freq: Four times a day (QID) | INTRAMUSCULAR | Status: DC | PRN

## 2020-07-01 MED ORDER — MONTELUKAST SODIUM 10 MG PO TABS
10.0000 mg | ORAL_TABLET | Freq: Every day | ORAL | Status: DC
  Administered 2020-07-02: 10 mg via ORAL
  Filled 2020-07-01: qty 1

## 2020-07-01 MED ORDER — PANTOPRAZOLE SODIUM 20 MG PO TBEC
20.0000 mg | DELAYED_RELEASE_TABLET | Freq: Every day | ORAL | Status: DC
  Administered 2020-07-01 – 2020-07-02 (×2): 20 mg via ORAL
  Filled 2020-07-01 (×2): qty 1

## 2020-07-01 MED ORDER — AMLODIPINE BESYLATE 5 MG PO TABS
5.0000 mg | ORAL_TABLET | Freq: Every day | ORAL | Status: DC
  Administered 2020-07-01 – 2020-07-02 (×2): 5 mg via ORAL
  Filled 2020-07-01 (×2): qty 1

## 2020-07-01 MED ORDER — ONDANSETRON HCL 4 MG PO TABS: 4.0000 mg | ORAL_TABLET | Freq: Four times a day (QID) | ORAL | Status: DC | PRN

## 2020-07-01 MED ORDER — MORPHINE SULFATE (PF) 2 MG/ML IV SOLN: 2.0000 mg | INTRAVENOUS | Status: DC | PRN

## 2020-07-01 MED ORDER — SODIUM CHLORIDE 0.9 % IV SOLN: Freq: Once | INTRAVENOUS | Status: AC

## 2020-07-01 MED ORDER — LOSARTAN POTASSIUM 50 MG PO TABS
100.0000 mg | ORAL_TABLET | Freq: Every day | ORAL | Status: DC
  Administered 2020-07-02: 100 mg via ORAL
  Filled 2020-07-01: qty 2

## 2020-07-01 MED ORDER — PRAVASTATIN SODIUM 20 MG PO TABS
40.0000 mg | ORAL_TABLET | Freq: Every day | ORAL | Status: DC
  Administered 2020-07-01 – 2020-07-02 (×2): 40 mg via ORAL
  Filled 2020-07-01 (×2): qty 2

## 2020-07-01 NOTE — ED Provider Notes (Addendum)
[facility] DEPT [facility] Provider Note: [name], MD, FACEP  CSN: [id number] MRN: [id number] ARRIVAL: 07/01/20 at [place]: [place]  Abdominal Pain   HISTORY OF PRESENT ILLNESS  07/01/20 1:22 AM Cynthia Peters is a 70 y.o. female with history of mesh repair of a ventral hernia as well as subsequent small bowel obstructions.  She is here with abdominal pain that began about 4 hours ago.  The pain was initially below her umbilicus but is now more above her umbilicus.  It has both burning and cramping components.  The pain is waxing and waning in currently she rates it as a 6 out of 10.  It is only somewhat worse with movement or palpation.  She denies associated nausea, vomiting or diarrhea.  Her abdomen does not feel more distended than baseline.   Past Medical History:  Diagnosis Date  . Anemia    history in the past, not on a regular basis  . Arthritis   . Bronchitis   . Cataracts, bilateral    immature  . Claustrophobia    takes Valium daily as needed  . Complication of anesthesia    2013 SURGERY vocal cord bothering pt, used smaller ent tube after 2 hernia repair, no problem after . " I woke up during my MRI and colonoscopy."       . Depression    takes Wellbutrin daily  . Dry eye   . Dry mouth   . GERD (gastroesophageal reflux disease)    takes Omeprazole daily   . H/O hiatal hernia   . Headache    migraines with flashing lights   . History of blood clots 40 yrs    leg   . History of echocardiogram 2013   a. Echo (05/2011): Mild LVH, EF 65-70%, no WMA, Gr 1 DD, mild AI.  Marland Kitchen History of MRSA infection   . History of staph infection   . History of stress test 2010   a. ETT-Echo in 2010 was normal  . History of TIA (transient ischemic attack) 2013   a. Carotid US (05/2011): No ICA stenosis, pt unsure of this  . Hyperlipidemia    takes Simvastatin daily   . Hypertension    takes  Losartan daily  . IBS (irritable bowel syndrome)     takes Bentyl daily as needed  . Insomnia    takes Ambien nightly  . Interstitial cystitis   . Lichen planus   . Mild asthma    Albuterol inhaler as needed  . Palpitations    Event monitor in 2007 with rare PVCs // takes Metoprolol daily  . PVC (premature ventricular contraction)   . Seasonal allergies    takes Claritin daily as needed.Takes Singulair daily as needed.  Marland Kitchen TIA (transient ischemic attack) 05/27/2011  . Urethral stricture   . Vertigo    takes Meclizine daily as needed    Past Surgical History:  Procedure Laterality Date  . CESAREAN SECTION    . COLONOSCOPY WITH PROPOFOL N/A 08/10/2012   Procedure: COLONOSCOPY WITH PROPOFOL;  Surgeon: Garlan Fair, MD;  Location: [facility];  Service: Endoscopy;  Laterality: N/A;  . CYSTOSCOPY WITH URETHRAL DILATATION N/A 02/18/2013   Procedure: CYSTOSCOPY WITH URETHRAL DILATATION ( NO BALLOON) AND HYDRODISTENSION;  Surgeon: Alexis Frock, MD;  Location: [facility];  Service: Urology;  Laterality: N/A;  . ENDOMETRIAL ABLATION W/ HYDROTHERMABLATOR  09-27-2002  . INCISIONAL HERNIA REPAIR N/A 10/25/2014   Procedure: HERNIA REPAIR  INCISIONAL;  Surgeon: Ralene Ok, MD;  Location: [place];  Service: General;  Laterality: N/A;  . INCISIONAL HERNIA REPAIR N/A 03/26/2015   Procedure: LAPAROSCOPIC INCISIONAL HERNIA;  Surgeon: Ralene Ok, MD;  Location: [place];  Service: General;  Laterality: N/A;  . INSERTION OF MESH N/A 10/25/2014   Procedure: INSERTION OF MESH;  Surgeon: Ralene Ok, MD;  Location: [place];  Service: General;  Laterality: N/A;  . LAPAROSCOPIC LYSIS OF ADHESIONS N/A 03/26/2015   Procedure: LAPAROSCOPIC LYSIS OF ADHESIONS;  Surgeon: Ralene Ok, MD;  Location: [place];  Service: General;  Laterality: N/A;  . LAPAROTOMY N/A 10/25/2014   Procedure: EXPLORATORY LAPAROTOMY;  Surgeon: Ralene Ok, MD;  Location: [place];  Service: General;  Laterality: N/A;  . LYSIS OF ADHESION N/A 10/25/2014   Procedure:  LYSIS OF ADHESIONS ;  Surgeon: Ralene Ok, MD;  Location: [place];  Service: General;  Laterality: N/A;  . MUCOSAL ADVANCEMENT FLAP N/A 10/25/2014   Procedure: MUCOSAL ADVANCEMENT FLAP;  Surgeon: Ralene Ok, MD;  Location: [place];  Service: General;  Laterality: N/A;  . ORIF RADIAL FRACTURE Right 06/17/2016   Procedure: right radial head arthroplasty with ligament repair, reconstruciton;  Surgeon: Roseanne Kaufman, MD;  Location: [place];  Service: Orthopedics;  Laterality: Right;  . RADIAL HEAD ARTHROPLASTY Right 06/17/2016  . TOOTH EXTRACTION    . TRANSTHORACIC ECHOCARDIOGRAM  05-26-2011   MILD LVH/  EF 09-32%/  GRADE I DIASTOLIC DYSFUNCTION/  MILD AR//   06-20-2008  NOMRAL STRESS ECHO  . UMBILICAL HERNIA REPAIR  2013   AND REVISION THE SAME YEAR W/ MESH  . WISDOM TOOTH EXTRACTION  AGE 15    Family History  Problem Relation Age of Onset  . Dementia Mother   . Leukemia Child   . Cirrhosis Father     Social History   Tobacco Use  . Smoking status: Former Smoker    Packs/day: 0.25    Years: 2.00    Pack years: 0.50    Types: Cigarettes    Quit date: 02/04/1976    Years since quitting: 44.4  . Smokeless tobacco: Never Used  Vaping Use  . Vaping Use: Never used  Substance Use Topics  . Alcohol use: Yes    Comment: occasional  . Drug use: No    Prior to Admission medications   Medication Sig Start Date End Date Taking? Authorizing Provider  acetaminophen (TYLENOL) 500 MG tablet Take 1,000 mg by mouth every 6 (six) hours as needed (pain).     [provider]  albuterol (PROVENTIL HFA;VENTOLIN HFA) 108 (90 BASE) MCG/ACT inhaler Inhale 2 puffs into the lungs every 6 (six) hours as needed for wheezing or shortness of breath.    [provider]  amLODipine (NORVASC) 2.5 MG tablet Take 2 tablets (5 mg total) by mouth daily. 05/22/20   Darreld Mclean, PA-C  aspirin 81 MG chewable tablet Chew 81 mg by mouth daily.    [provider]  buPROPion  (WELLBUTRIN XL) 300 MG 24 hr tablet Take 300 mg by mouth daily.    [provider]  Cholecalciferol (VITAMIN D) 2000 units CAPS Take 2,000 Units by mouth daily.    [provider]  clobetasol cream (TEMOVATE) 3.55 % Apply 1 application topically 2 (two) times daily as needed (rash).  04/01/16   [provider]  Coenzyme Q10 (CO Q 10) 100 MG CAPS Take 100 mg by mouth daily.     [provider]  [employer] VITAMIN C  1000 MG tablet Take 2,000 mg by mouth 2 (two) times daily.  06/13/16   [provider]  dicyclomine (BENTYL) 10 MG capsule Take 10 mg by mouth every 4 (four) hours as needed (for abdominal cramps).  02/13/15   [provider]  Evening Primrose Oil 1000 MG CAPS Take 1,000 mg by mouth daily.    [provider]  Fexofenadine HCl (ALLEGRA ALLERGY PO) 1 tablet    [provider]  Fexofenadine HCl (ALLEGRA PO) Take 1 tablet by mouth daily as needed (ALLERGIES).    [provider]  Flaxseed, Linseed, 1000 MG CAPS Take 1,000 mg by mouth 2 (two) times daily.    [provider]  fluticasone (FLONASE) 50 MCG/ACT nasal spray Place 2 sprays into both nostrils daily as needed for allergies or rhinitis.     [provider]  hydrochlorothiazide 25 MG tablet Take 25 mg by mouth daily.    [provider]  HYDROcodone-acetaminophen (NORCO) 10-325 MG tablet hydrocodone 10 mg-acetaminophen 325 mg tablet  TAKE 1 TABLET BY MOUTH EVERY 6 HOURS AS NEEDED FOR PAIN    [provider]  HYDROCORTISONE ACE, RECTAL, 30 MG SUPP Place 30 mg rectally 2 (two) times daily as needed for hemorrhoids. 04/07/16   [provider]  ibuprofen (ADVIL,MOTRIN) 200 MG tablet Take 400-600 mg by mouth 2 (two) times daily as needed (pain).     [provider]  ketotifen (ZADITOR) 0.025 % ophthalmic solution 1 drop into affected eye    [provider]  losartan (COZAAR) 100 MG tablet TAKE 1 TABLET BY MOUTH  EVERY DAY Patient taking differently: Take 100 mg by mouth daily. 09/14/17   Martinique, Peter M, MD  magnesium oxide (MAG-OX) 400 MG tablet 1 tablet    [provider]  meclizine (ANTIVERT) 25 MG tablet Take 1 tablet (25 mg total) by mouth 3 (three) times daily as needed for dizziness. 05/02/20   Robinson, Martinique N, PA-C  metoprolol succinate (TOPROL-XL) 100 MG 24 hr tablet Take 1 tablet (100 mg total) by mouth 2 (two) times daily. Take with or immediately following a meal. Take 100 mg by mouth 2 (two) times daily. Take with or immediately following a meal. 07/14/18   Martinique, Peter M, MD  montelukast (SINGULAIR) 10 MG tablet Take 10 mg by mouth daily.    [provider]  Multiple Vitamins-Minerals (MULTIPLE VITAMINS/WOMENS PO) 1 tablet    [provider]  nystatin (MYCOSTATIN) 100000 UNIT/ML suspension Take 5 mLs by mouth daily as needed (FLARES, LICHEN PLANTIS).    [provider]  nystatin cream (MYCOSTATIN) Apply 1 application topically 2 (two) times daily as needed for dry skin.    [provider]  omeprazole (PRILOSEC) 20 MG capsule Take 1 capsule (20 mg total) by mouth daily. 11/18/18   Horton, Barbette Hair, MD  ondansetron (ZOFRAN ODT) 4 MG disintegrating tablet Take 1 tablet (4 mg total) by mouth every 8 (eight) hours as needed for nausea or vomiting. 11/18/18   Horton, Barbette Hair, MD  pantoprazole (PROTONIX) 20 MG tablet Take 1 tablet by mouth daily. 05/21/20   [provider]  phenazopyridine (PYRIDIUM) 200 MG tablet Take 200 mg by mouth 3 (three) times daily as needed for pain [place] TRACT PAIN).    [provider]  polyethylene glycol (MIRALAX / GLYCOLAX) 17 g packet Take 17 g by mouth 2 (two) times daily. 11/30/19   Barb Merino, MD  Potassium Chloride ER 20 MEQ TBCR 1 1/2  tablets with food    [provider]  potassium chloride SA (KLOR-CON M20) 20 MEQ tablet TAKE 1&1/2 TABLETS BY MOUTH DAILY. 02/16/20   Martinique, Peter M,  MD  pravastatin (PRAVACHOL) 40 MG tablet Take 1 tablet by mouth daily. 05/21/20   [provider]  PREVIDENT 5000 DRY MOUTH 1.1 % GEL dental gel Place 1 application onto teeth at bedtime. 09/01/14   [provider]  Probiotic Product (ALIGN PO) Take 1 capsule by mouth 2 (two) times daily.    [provider]  senna-docusate (SENOKOT-S) 8.6-50 MG tablet Take 1 tablet by mouth 2 (two) times daily. 11/30/19   Barb Merino, MD  valACYclovir (VALTREX) 500 MG tablet INCREASE TO TAKING 1 TABLET 2 TIMES A DAY FOR 3 DAYS WITH OUTBREAK Patient taking differently: Take 500 mg by mouth daily. 03/25/16   Kem Boroughs, FNP  zolpidem (AMBIEN) 10 MG tablet Take 5-10 mg by mouth at bedtime as needed for sleep. 07/15/17   [provider]    Allergies Hydrocodone, Hydrocodone-acetaminophen, and Tramadol   REVIEW OF SYSTEMS  Negative except as noted here or in the History of Present Illness.   PHYSICAL EXAMINATION  Initial Vital Signs Blood pressure (!) 161/77, pulse 72, temperature 98.3 F (36.8 C), temperature source Oral, resp. rate 18, height 5\' 4"  (1.626 m), weight 73.9 kg, SpO2 100 %.  Examination General: Well-developed, well-nourished female in no acute distress; appearance consistent with age of record HENT: normocephalic; atraumatic Eyes: pupils equal, round and reactive to light; extraocular muscles intact Neck: supple Heart: regular rate and rhythm Lungs: clear to auscultation bilaterally Abdomen: soft; nondistended; mild diffuse tenderness; bowel sounds present Extremities: No deformity; full range of motion; pulses normal Neurologic: Awake, alert and oriented; motor function intact in all extremities and symmetric; no facial droop Skin: Warm and dry Psychiatric: Normal mood and affect   RESULTS  Summary of this visit's results, reviewed and interpreted by myself:   EKG Interpretation  Date/Time:    Ventricular Rate:    PR Interval:    QRS  Duration:   QT Interval:    QTC Calculation:   R Axis:     Text Interpretation:        Laboratory Studies: Results for orders placed or performed during the hospital encounter of 07/01/20 (from the past 24 hour(s))  Urinalysis, Routine w reflex microscopic Urine, Clean Catch     Status: Abnormal   Collection Time: 07/01/20  1:33 AM  Result Value Ref Range   Color, Urine YELLOW YELLOW   APPearance CLEAR CLEAR   Specific Gravity, Urine 1.025 1.005 - 1.030   pH 7.0 5.0 - 8.0   Glucose, UA NEGATIVE NEGATIVE mg/dL   Hgb urine dipstick NEGATIVE NEGATIVE   Bilirubin Urine NEGATIVE NEGATIVE   Ketones, ur NEGATIVE NEGATIVE mg/dL   Protein, ur NEGATIVE NEGATIVE mg/dL   Nitrite NEGATIVE NEGATIVE   Leukocytes,Ua SMALL (A) NEGATIVE  CBC with Differential/Platelet     Status: Abnormal   Collection Time: 07/01/20  1:33 AM  Result Value Ref Range   WBC 12.9 (H) 4.0 - 10.5 K/uL   RBC 4.67 3.87 - 5.11 MIL/uL   Hemoglobin 15.1 (H) 12.0 - 15.0 g/dL   HCT 43.7 36.0 - 46.0 %   MCV 93.6 80.0 - 100.0 fL   MCH 32.3 26.0 - 34.0 pg   MCHC 34.6 30.0 - 36.0 g/dL   RDW 12.6 11.5 - 15.5 %   Platelets 266 150 - 400 K/uL   nRBC  0.0 0.0 - 0.2 %   Neutrophils Relative % 78 %   Neutro Abs 10.0 (H) 1.7 - 7.7 K/uL   Lymphocytes Relative 9 %   Lymphs Abs 1.2 0.7 - 4.0 K/uL   Monocytes Relative 8 %   Monocytes Absolute 1.0 0.1 - 1.0 K/uL   Eosinophils Relative 4 %   Eosinophils Absolute 0.5 0.0 - 0.5 K/uL   Basophils Relative 1 %   Basophils Absolute 0.1 0.0 - 0.1 K/uL   Immature Granulocytes 0 %   Abs Immature Granulocytes 0.04 0.00 - 0.07 K/uL  Comprehensive metabolic panel     Status: Abnormal   Collection Time: 07/01/20  1:33 AM  Result Value Ref Range   Sodium 139 135 - 145 mmol/L   Potassium 4.0 3.5 - 5.1 mmol/L   Chloride 102 98 - 111 mmol/L   CO2 27 22 - 32 mmol/L   Glucose, Bld 136 (H) 70 - 99 mg/dL   BUN 26 (H) 8 - 23 mg/dL   Creatinine, Ser 0.87 0.44 - 1.00 mg/dL   Calcium 9.7 8.9 -  10.3 mg/dL   Total Protein 7.1 6.5 - 8.1 g/dL   Albumin 4.3 3.5 - 5.0 g/dL   AST 26 15 - 41 U/L   ALT 26 0 - 44 U/L   Alkaline Phosphatase 68 38 - 126 U/L   Total Bilirubin 1.3 (H) 0.3 - 1.2 mg/dL   GFR, Estimated >60 >60 mL/min   Anion gap 10 5 - 15  Lipase, blood     Status: None   Collection Time: 07/01/20  1:33 AM  Result Value Ref Range   Lipase 30 11 - 51 U/L  Urinalysis, Microscopic (reflex)     Status: Abnormal   Collection Time: 07/01/20  1:33 AM  Result Value Ref Range   RBC / HPF 0-5 0 - 5 RBC/hpf   WBC, UA 11-20 0 - 5 WBC/hpf   Bacteria, UA MANY (A) NONE SEEN   Squamous Epithelial / LPF 0-5 0 - 5   Ca Oxalate Crys, UA PRESENT   Resp Panel by RT-PCR (Flu A&B, Covid) Nasopharyngeal Swab     Status: None   Collection Time: 07/01/20  3:14 AM   Specimen: Nasopharyngeal Swab; Nasopharyngeal(NP) swabs in vial transport medium  Result Value Ref Range   SARS Coronavirus 2 by RT PCR NEGATIVE NEGATIVE   Influenza A by PCR NEGATIVE NEGATIVE   Influenza B by PCR NEGATIVE NEGATIVE   Imaging Studies: CT ABDOMEN PELVIS WO CONTRAST  Result Date: 07/01/2020 CLINICAL DATA:  4 hours of abdominal pain in area of hernia surgery EXAM: CT ABDOMEN AND PELVIS WITHOUT CONTRAST TECHNIQUE: Multidetector CT imaging of the abdomen and pelvis was performed following the standard protocol without IV contrast. COMPARISON:  Radiograph 11/30/2019, CT 11/27/2019 FINDINGS: Lower chest: Lung bases demonstrate no acute consolidation or effusion. Normal cardiac size. Hepatobiliary: Hepatic cysts. No calcified gallstone or biliary dilatation Pancreas: Unremarkable. No pancreatic ductal dilatation or surrounding inflammatory changes. Spleen: Normal in size without focal abnormality. Adrenals/Urinary Tract: Adrenal glands are unremarkable. Kidneys are normal, without renal calculi, focal lesion, or hydronephrosis. Bladder is unremarkable. Stomach/Bowel: Stomach nonenlarged. Fluid-filled borderline distended mid  small bowel with fecalized segment in the pelvis, series 2, image 65. Suspected transition point in the pelvis in the region of fecalized small bowel. Small bowel distal to this is decompressed. No acute bowel wall thickening. Scattered diverticular disease of the colon without acute wall thickening. Negative appendix Vascular/Lymphatic: Nonaneurysmal aorta. Mild atherosclerosis. No  suspicious nodes Reproductive: Uterus and bilateral adnexa are unremarkable. Other: Negative for free air or free fluid Musculoskeletal: Grade 1-2 anterolisthesis L4 on L5. Advanced degenerative changes at L4-L5 and L5-S1. IMPRESSION: 1. Findings suspicious for low-grade small bowel obstruction involving the mid to distal small bowel. 2. Scattered diverticular disease of the colon without acute wall thickening Electronically Signed   By: Donavan Foil M.D.   On: 07/01/2020 02:58    ED COURSE and MDM  Nursing notes, initial and subsequent vitals signs, including pulse oximetry, reviewed and interpreted by myself.  Vitals:   07/01/20 0042 07/01/20 0130 07/01/20 0222 07/01/20 0330  BP:  (!) 164/70 (!) 164/70 (!) 156/69  Pulse:  62 62 60  Resp:   18 17  Temp:    98.6 F (37 C)  TempSrc:    Oral  SpO2:  99% 99% 99%  Weight: 73.9 kg     Height: 5\' 4"  (1.626 m)      Medications  fentaNYL (SUBLIMAZE) injection 100 mcg (100 mcg Intravenous Given 07/01/20 0324)  0.9 %  sodium chloride infusion ( Intravenous New Bag/Given 07/01/20 0316)  ondansetron (ZOFRAN) injection 4 mg (4 mg Intravenous Given 07/01/20 0324)   3:22 AM Dr. Hal Hope to admit to hospitalist service.   PROCEDURES  Procedures   ED DIAGNOSES     ICD-10-CM   1. Small bowel obstruction ([place])  K56.609   2. COVID-19 virus not detected  Z20.822        Shanon Rosser, MD 07/01/20 8421    Shanon Rosser, MD 07/01/20 [phone]

## 2020-07-01 NOTE — ED Notes (Signed)
Pt removed from supplemental oxygen, pt maintained oxygen above 95%

## 2020-07-01 NOTE — ED Notes (Signed)
EDP at bedside  

## 2020-07-01 NOTE — Consult Note (Signed)
Reason for Consult partial small bowel obstruction Referring Physician: Marylyn Ishihara MD  Cynthia Peters is an 70 y.o. female.  HPI: Asked to see patient at the request of Dr. Marylyn Ishihara for partial small bowel obstruction.  Patient has a history of multiple abdominal and hernia operations.  She developed diffuse crampy periumbilical abdominal pain.  This persisted and brought her to the emergency room earlier yesterday afternoon and evening.  CT scan showed a partial small bowel obstruction.  She had some nausea but that is resolved.  Currently her pain is now resolved.  She is little sore in her umbilicus but otherwise feels better.  Past Medical History:  Diagnosis Date  . Anemia    history in the past, not on a regular basis  . Arthritis   . Bronchitis   . Cataracts, bilateral    immature  . Claustrophobia    takes Valium daily as needed  . Complication of anesthesia    2013 SURGERY vocal cord bothering pt, used smaller ent tube after 2 hernia repair, no problem after . " I woke up during my MRI and colonoscopy."       . Depression    takes Wellbutrin daily  . Dry eye   . Dry mouth   . GERD (gastroesophageal reflux disease)    takes Omeprazole daily   . H/O hiatal hernia   . Headache    migraines with flashing lights   . History of blood clots 40 yrs    leg   . History of echocardiogram 2013   a. Echo (05/2011): Mild LVH, EF 65-70%, no WMA, Gr 1 DD, mild AI.  Marland Kitchen History of MRSA infection   . History of staph infection   . History of stress test 2010   a. ETT-Echo in 2010 was normal  . History of TIA (transient ischemic attack) 2013   a. Carotid US (05/2011): No ICA stenosis, pt unsure of this  . Hyperlipidemia    takes Simvastatin daily   . Hypertension    takes  Losartan daily  . IBS (irritable bowel syndrome)    takes Bentyl daily as needed  . Insomnia    takes Ambien nightly  . Interstitial cystitis   . Lichen planus   . Mild asthma    Albuterol inhaler as needed  .  Palpitations    Event monitor in 2007 with rare PVCs // takes Metoprolol daily  . PVC (premature ventricular contraction)   . Seasonal allergies    takes Claritin daily as needed.Takes Singulair daily as needed.  Marland Kitchen TIA (transient ischemic attack) 05/27/2011  . Urethral stricture   . Vertigo    takes Meclizine daily as needed    Past Surgical History:  Procedure Laterality Date  . CESAREAN SECTION    . COLONOSCOPY WITH PROPOFOL N/A 08/10/2012   Procedure: COLONOSCOPY WITH PROPOFOL;  Surgeon: Garlan Fair, MD;  Location: [facility];  Service: Endoscopy;  Laterality: N/A;  . CYSTOSCOPY WITH URETHRAL DILATATION N/A 02/18/2013   Procedure: CYSTOSCOPY WITH URETHRAL DILATATION ( NO BALLOON) AND HYDRODISTENSION;  Surgeon: Alexis Frock, MD;  Location: [facility];  Service: Urology;  Laterality: N/A;  . ENDOMETRIAL ABLATION W/ HYDROTHERMABLATOR  09-27-2002  . INCISIONAL HERNIA REPAIR N/A 10/25/2014   Procedure: HERNIA REPAIR INCISIONAL;  Surgeon: Ralene Ok, MD;  Location: [place];  Service: General;  Laterality: N/A;  . [place] N/A 03/26/2015   Procedure: LAPAROSCOPIC INCISIONAL HERNIA;  Surgeon: Ralene Ok, MD;  Location: [place];  Service: General;  Laterality: N/A;  . INSERTION OF MESH N/A 10/25/2014   Procedure: INSERTION OF MESH;  Surgeon: Ralene Ok, MD;  Location: [place];  Service: General;  Laterality: N/A;  . LAPAROSCOPIC LYSIS OF ADHESIONS N/A 03/26/2015   Procedure: LAPAROSCOPIC LYSIS OF ADHESIONS;  Surgeon: Ralene Ok, MD;  Location: [place];  Service: General;  Laterality: N/A;  . LAPAROTOMY N/A 10/25/2014   Procedure: EXPLORATORY LAPAROTOMY;  Surgeon: Ralene Ok, MD;  Location: [place];  Service: General;  Laterality: N/A;  . LYSIS OF ADHESION N/A 10/25/2014   Procedure: LYSIS OF ADHESIONS ;  Surgeon: Ralene Ok, MD;  Location: [place];  Service: General;  Laterality: N/A;  . MUCOSAL ADVANCEMENT FLAP N/A 10/25/2014   Procedure:  MUCOSAL ADVANCEMENT FLAP;  Surgeon: Ralene Ok, MD;  Location: [place];  Service: General;  Laterality: N/A;  . ORIF RADIAL FRACTURE Right 06/17/2016   Procedure: right radial head arthroplasty with ligament repair, reconstruciton;  Surgeon: Roseanne Kaufman, MD;  Location: [place];  Service: Orthopedics;  Laterality: Right;  . RADIAL HEAD ARTHROPLASTY Right 06/17/2016  . TOOTH EXTRACTION    . TRANSTHORACIC ECHOCARDIOGRAM  05-26-2011   MILD LVH/  EF 05-39%/  GRADE I DIASTOLIC DYSFUNCTION/  MILD AR//   06-20-2008  NOMRAL STRESS ECHO  . UMBILICAL HERNIA REPAIR  2013   AND REVISION THE SAME YEAR W/ MESH  . WISDOM TOOTH EXTRACTION  AGE 46    Family History  Problem Relation Age of Onset  . Dementia Mother   . Leukemia Child   . Cirrhosis Father     Social History:  reports that she quit smoking about 44 years ago. Her smoking use included cigarettes. She has a 0.50 pack-year smoking history. She has never used smokeless tobacco. She reports current alcohol use. She reports that she does not use drugs.  Allergies:  Allergies  Allergen Reactions  . Hydrocodone Rash  . Hydrocodone-Acetaminophen Rash  . Tramadol Palpitations    Medications: I have reviewed the patient's current medications.  Results for orders placed or performed during the hospital encounter of 07/01/20 (from the past 48 hour(s))  Urinalysis, Routine w reflex microscopic Urine, Clean Catch     Status: Abnormal   Collection Time: 07/01/20  1:33 AM  Result Value Ref Range   Color, Urine YELLOW YELLOW   APPearance CLEAR CLEAR   Specific Gravity, Urine 1.025 1.005 - 1.030   pH 7.0 5.0 - 8.0   Glucose, UA NEGATIVE NEGATIVE mg/dL   Hgb urine dipstick NEGATIVE NEGATIVE   Bilirubin Urine NEGATIVE NEGATIVE   Ketones, ur NEGATIVE NEGATIVE mg/dL   Protein, ur NEGATIVE NEGATIVE mg/dL   Nitrite NEGATIVE NEGATIVE   Leukocytes,Ua SMALL (A) NEGATIVE    Comment: Performed at [facility], [address] [place], [place] [address]  CBC with Differential/Platelet     Status: Abnormal   Collection Time: 07/01/20  1:33 AM  Result Value Ref Range   WBC 12.9 (H) 4.0 - 10.5 K/uL   RBC 4.67 3.87 - 5.11 MIL/uL   Hemoglobin 15.1 (H) 12.0 - 15.0 g/dL   HCT 43.7 36.0 - 46.0 %   MCV 93.6 80.0 - 100.0 fL   MCH 32.3 26.0 - 34.0 pg   MCHC 34.6 30.0 - 36.0 g/dL   RDW 12.6 11.5 - 15.5 %   Platelets 266 150 - 400 K/uL   nRBC 0.0 0.0 - 0.2 %   Neutrophils Relative % 78 %   Neutro Abs 10.0 (  H) 1.7 - 7.7 K/uL   Lymphocytes Relative 9 %   Lymphs Abs 1.2 0.7 - 4.0 K/uL   Monocytes Relative 8 %   Monocytes Absolute 1.0 0.1 - 1.0 K/uL   Eosinophils Relative 4 %   Eosinophils Absolute 0.5 0.0 - 0.5 K/uL   Basophils Relative 1 %   Basophils Absolute 0.1 0.0 - 0.1 K/uL   Immature Granulocytes 0 %   Abs Immature Granulocytes 0.04 0.00 - 0.07 K/uL    Comment: Performed at [facility], [address] [place], [place] [address]  Comprehensive metabolic panel     Status: Abnormal   Collection Time: 07/01/20  1:33 AM  Result Value Ref Range   Sodium 139 135 - 145 mmol/L   Potassium 4.0 3.5 - 5.1 mmol/L   Chloride 102 98 - 111 mmol/L   CO2 27 22 - 32 mmol/L   Glucose, Bld 136 (H) 70 - 99 mg/dL    Comment: Glucose reference range applies only to samples taken after fasting for at least 8 hours.   BUN 26 (H) 8 - 23 mg/dL   Creatinine, Ser 0.87 0.44 - 1.00 mg/dL   Calcium 9.7 8.9 - 10.3 mg/dL   Total Protein 7.1 6.5 - 8.1 g/dL   Albumin 4.3 3.5 - 5.0 g/dL   AST 26 15 - 41 U/L   ALT 26 0 - 44 U/L   Alkaline Phosphatase 68 38 - 126 U/L   Total Bilirubin 1.3 (H) 0.3 - 1.2 mg/dL   GFR, Estimated >60 >60 mL/min    Comment: (NOTE) Calculated using the CKD-EPI Creatinine Equation (2021)    Anion gap 10 5 - 15    Comment: Performed at [facility], [address] [place], [place] [address]  Lipase, blood     Status: None   Collection Time: 07/01/20  1:33 AM  Result Value Ref Range   Lipase 30  11 - 51 U/L    Comment: Performed at [facility], [address] [place], [place] [address]  Urinalysis, Microscopic (reflex)     Status: Abnormal   Collection Time: 07/01/20  1:33 AM  Result Value Ref Range   RBC / HPF 0-5 0 - 5 RBC/hpf   WBC, UA 11-20 0 - 5 WBC/hpf   Bacteria, UA MANY (A) NONE SEEN   Squamous Epithelial / LPF 0-5 0 - 5   Ca Oxalate Crys, UA PRESENT     Comment: Performed at [facility], [address] [place], [place] [address]  Resp Panel by RT-PCR (Flu A&B, Covid) Nasopharyngeal Swab     Status: None   Collection Time: 07/01/20  3:14 AM   Specimen: Nasopharyngeal Swab; Nasopharyngeal(NP) swabs in vial transport medium  Result Value Ref Range   SARS Coronavirus 2 by RT PCR NEGATIVE NEGATIVE    Comment: (NOTE) SARS-CoV-2 target nucleic acids are NOT DETECTED.  The SARS-CoV-2 RNA is generally detectable in upper respiratory specimens during the acute phase of infection. The lowest concentration of SARS-CoV-2 viral copies this assay can detect is 138 copies/mL. A negative result does not preclude SARS-Cov-2 infection and should not be used as the sole basis for treatment or other patient management decisions. A negative result may occur with  improper specimen collection/handling, submission of specimen other than nasopharyngeal swab, presence of viral mutation(s) within the areas targeted by this assay, and inadequate number of viral copies(<138 copies/mL). A negative result must be combined  with clinical observations, patient history, and epidemiological information. The expected result is Negative.  Fact Sheet for Patients:  EntrepreneurPulse.com.au  Fact Sheet for Healthcare Providers:  IncredibleEmployment.be  This test is no t yet approved or cleared by the [place] FDA and  has been authorized for detection and/or diagnosis of SARS-CoV-2 by FDA under an Emergency Use Authorization (EUA).  This EUA will remain  in effect (meaning this test can be used) for the duration of the COVID-19 declaration under Section 564(b)(1) of the Act, 21 U.S.C.section 360bbb-3(b)(1), unless the authorization is terminated  or revoked sooner.       Influenza A by PCR NEGATIVE NEGATIVE   Influenza B by PCR NEGATIVE NEGATIVE    Comment: (NOTE) The Xpert Xpress SARS-CoV-2/FLU/RSV plus assay is intended as an aid in the diagnosis of influenza from Nasopharyngeal swab specimens and should not be used as a sole basis for treatment. Nasal washings and aspirates are unacceptable for Xpert Xpress SARS-CoV-2/FLU/RSV testing.  Fact Sheet for Patients: EntrepreneurPulse.com.au  Fact Sheet for Healthcare Providers: IncredibleEmployment.be  This test is not yet approved or cleared by the [place] FDA and has been authorized for detection and/or diagnosis of SARS-CoV-2 by FDA under an Emergency Use Authorization (EUA). This EUA will remain in effect (meaning this test can be used) for the duration of the COVID-19 declaration under Section 564(b)(1) of the Act, 21 U.S.C. section 360bbb-3(b)(1), unless the authorization is terminated or revoked.  Performed at [facility], [address] [place], [place] [address]     CT ABDOMEN PELVIS WO CONTRAST  Result Date: 07/01/2020 CLINICAL DATA:  4 hours of abdominal pain in area of hernia surgery EXAM: CT ABDOMEN AND PELVIS WITHOUT CONTRAST TECHNIQUE: Multidetector CT imaging of the abdomen and pelvis was performed following the standard protocol without IV contrast. COMPARISON:  Radiograph 11/30/2019, CT 11/27/2019 FINDINGS: Lower chest: Lung bases demonstrate no acute consolidation or effusion. Normal cardiac size. Hepatobiliary: Hepatic cysts. No calcified gallstone or biliary dilatation Pancreas: Unremarkable. No pancreatic ductal dilatation or surrounding inflammatory changes. Spleen: Normal in size  without focal abnormality. Adrenals/Urinary Tract: Adrenal glands are unremarkable. Kidneys are normal, without renal calculi, focal lesion, or hydronephrosis. Bladder is unremarkable. Stomach/Bowel: Stomach nonenlarged. Fluid-filled borderline distended mid small bowel with fecalized segment in the pelvis, series 2, image 65. Suspected transition point in the pelvis in the region of fecalized small bowel. Small bowel distal to this is decompressed. No acute bowel wall thickening. Scattered diverticular disease of the colon without acute wall thickening. Negative appendix Vascular/Lymphatic: Nonaneurysmal aorta. Mild atherosclerosis. No suspicious nodes Reproductive: Uterus and bilateral adnexa are unremarkable. Other: Negative for free air or free fluid Musculoskeletal: Grade 1-2 anterolisthesis L4 on L5. Advanced degenerative changes at L4-L5 and L5-S1. IMPRESSION: 1. Findings suspicious for low-grade small bowel obstruction involving the mid to distal small bowel. 2. Scattered diverticular disease of the colon without acute wall thickening Electronically Signed   By: Donavan Foil M.D.   On: 07/01/2020 02:58    Review of Systems  Gastrointestinal: Positive for abdominal distention and abdominal pain.  All other systems reviewed and are negative.  Blood pressure (!) 119/58, pulse 61, temperature (!) 97.5 F (36.4 C), temperature source Oral, resp. rate 18, height 5\' 4"  (1.626 m), weight 73.9 kg, SpO2 99 %. Physical Exam Constitutional:      Appearance: She is well-developed.  HENT:     Head: Normocephalic.  Eyes:     Extraocular Movements: Extraocular movements intact.  Cardiovascular:     Rate and Rhythm: Normal rate and regular rhythm.  Pulmonary:     Effort: Pulmonary effort is normal.     Breath sounds: Normal breath sounds.  Abdominal:     General: Abdomen is flat. A surgical scar is present.     Palpations: Abdomen is soft.     Tenderness: There is no abdominal tenderness.      Hernia: No hernia is present.  Skin:    General: Skin is warm and dry.  Neurological:     General: No focal deficit present.     Mental Status: She is alert.  Psychiatric:        Mood and Affect: Mood normal.        Behavior: Behavior normal.     Assessment/Plan: Partial small bowel obstruction-she has a history of this in the past.  I reviewed her CT scan, and examined the patient and reviewed her chart.  She has no evidence of recurrent hernia on examination.  She is soft and nontender.  She may have sips of clears for today for comfort.  Repeat KUB in a.m.  Hold on NG tube unless she vomits.  No acute surgical indication  Joyice Faster Tahjai Schetter 07/01/2020, 1:45 PM

## 2020-07-01 NOTE — ED Notes (Addendum)
Attempted PIV once in [place] unsuccessfully; pt tolerated well; pt requests placement in that arm because "I am going to have a CT scan next week because of erosion in my elbow but it effects my whole arm"; charge RN to attempt

## 2020-07-01 NOTE — ED Triage Notes (Signed)
Pt reports 4 hours of abd pain today in the area of her last hernia surgery. Concerned for obstruction

## 2020-07-01 NOTE — ED Notes (Signed)
Pt ambulatory with steady gait to restroom as per patient request.

## 2020-07-01 NOTE — ED Notes (Signed)
Up to restroom, sitting on side of stretcher, states abd pain has returned, pt feels restless on stretcher, cont to await for room assignment. ED MD informed of pt c/o abd pain, verbal orders rec

## 2020-07-01 NOTE — ED Notes (Signed)
Pt placed on 1L [place] due due oxygen dipping to 86% on RA; EDP aware

## 2020-07-01 NOTE — ED Notes (Signed)
Phone handoff report given to [facility] at [facility] 3rd floor Phone handoff report provided to [employer]

## 2020-07-01 NOTE — H&P (Signed)
History and Physical    Cynthia Peters OEU:[id number] DOB: November 27, 1950 DOA: 07/01/2020  PCP: Aletha Halim., PA-C  Patient coming from: home  Chief Complaint: stomach pain  HPI: Cynthia Peters is a 70 y.o. female with medical history significant of GERD, IBS, HLD, HTN. Presenting with abdominal pain. RLQ abdominal pain started last night. It radiated globally, but was sharpest in the RLQ. Sharp episodic pain. She didn't try any medicines at home. She had no N/V. She had a normal caliber BM earlier in the day. Her pain continued through the night, and it reminded her of her previous SBOs. So, she came to the ED for assistance. She denies any other aggravating or alleviating factors.    ED Course: CT was suspicious for low grade SBO. [facility] was called for admission.    Review of Systems:  Denies CP, dyspnea, palpitations, N/V/D, fevers. Review of systems is otherwise negative for all not mentioned in HPI.   PMHx Past Medical History:  Diagnosis Date  . Anemia    history in the past, not on a regular basis  . Arthritis   . Bronchitis   . Cataracts, bilateral    immature  . Claustrophobia    takes Valium daily as needed  . Complication of anesthesia    2013 SURGERY vocal cord bothering pt, used smaller ent tube after 2 hernia repair, no problem after . " I woke up during my MRI and colonoscopy."       . Depression    takes Wellbutrin daily  . Dry eye   . Dry mouth   . GERD (gastroesophageal reflux disease)    takes Omeprazole daily   . H/O hiatal hernia   . Headache    migraines with flashing lights   . History of blood clots 40 yrs    leg   . History of echocardiogram 2013   a. Echo (05/2011): Mild LVH, EF 65-70%, no WMA, Gr 1 DD, mild AI.  Marland Kitchen History of MRSA infection   . History of staph infection   . History of stress test 2010   a. ETT-Echo in 2010 was normal  . History of TIA (transient ischemic attack) 2013   a. Carotid US (05/2011): No ICA stenosis, pt  unsure of this  . Hyperlipidemia    takes Simvastatin daily   . Hypertension    takes  Losartan daily  . IBS (irritable bowel syndrome)    takes Bentyl daily as needed  . Insomnia    takes Ambien nightly  . Interstitial cystitis   . Lichen planus   . Mild asthma    Albuterol inhaler as needed  . Palpitations    Event monitor in 2007 with rare PVCs // takes Metoprolol daily  . PVC (premature ventricular contraction)   . Seasonal allergies    takes Claritin daily as needed.Takes Singulair daily as needed.  Marland Kitchen TIA (transient ischemic attack) 05/27/2011  . Urethral stricture   . Vertigo    takes Meclizine daily as needed    PSHx Past Surgical History:  Procedure Laterality Date  . CESAREAN SECTION    . COLONOSCOPY WITH PROPOFOL N/A 08/10/2012   Procedure: COLONOSCOPY WITH PROPOFOL;  Surgeon: Garlan Fair, MD;  Location: [facility];  Service: Endoscopy;  Laterality: N/A;  . CYSTOSCOPY WITH URETHRAL DILATATION N/A 02/18/2013   Procedure: CYSTOSCOPY WITH URETHRAL DILATATION ( NO BALLOON) AND HYDRODISTENSION;  Surgeon: Alexis Frock, MD;  Location: [facility];  Service: Urology;  Laterality:  N/A;  . ENDOMETRIAL ABLATION W/ HYDROTHERMABLATOR  09-27-2002  . INCISIONAL HERNIA REPAIR N/A 10/25/2014   Procedure: HERNIA REPAIR INCISIONAL;  Surgeon: Ralene Ok, MD;  Location: [place];  Service: General;  Laterality: N/A;  . INCISIONAL HERNIA REPAIR N/A 03/26/2015   Procedure: LAPAROSCOPIC INCISIONAL HERNIA;  Surgeon: Ralene Ok, MD;  Location: [place];  Service: General;  Laterality: N/A;  . INSERTION OF MESH N/A 10/25/2014   Procedure: INSERTION OF MESH;  Surgeon: Ralene Ok, MD;  Location: [place];  Service: General;  Laterality: N/A;  . LAPAROSCOPIC LYSIS OF ADHESIONS N/A 03/26/2015   Procedure: LAPAROSCOPIC LYSIS OF ADHESIONS;  Surgeon: Ralene Ok, MD;  Location: [place];  Service: General;  Laterality: N/A;  . LAPAROTOMY N/A 10/25/2014   Procedure: EXPLORATORY  LAPAROTOMY;  Surgeon: Ralene Ok, MD;  Location: [place];  Service: General;  Laterality: N/A;  . LYSIS OF ADHESION N/A 10/25/2014   Procedure: LYSIS OF ADHESIONS ;  Surgeon: Ralene Ok, MD;  Location: [place];  Service: General;  Laterality: N/A;  . MUCOSAL ADVANCEMENT FLAP N/A 10/25/2014   Procedure: MUCOSAL ADVANCEMENT FLAP;  Surgeon: Ralene Ok, MD;  Location: [place];  Service: General;  Laterality: N/A;  . ORIF RADIAL FRACTURE Right 06/17/2016   Procedure: right radial head arthroplasty with ligament repair, reconstruciton;  Surgeon: Roseanne Kaufman, MD;  Location: [place];  Service: Orthopedics;  Laterality: Right;  . RADIAL HEAD ARTHROPLASTY Right 06/17/2016  . TOOTH EXTRACTION    . TRANSTHORACIC ECHOCARDIOGRAM  05-26-2011   MILD LVH/  EF 86-57%/  GRADE I DIASTOLIC DYSFUNCTION/  MILD AR//   06-20-2008  NOMRAL STRESS ECHO  . UMBILICAL HERNIA REPAIR  2013   AND REVISION THE SAME YEAR W/ MESH  . WISDOM TOOTH EXTRACTION  AGE 49    SocHx  reports that she quit smoking about 44 years ago. Her smoking use included cigarettes. She has a 0.50 pack-year smoking history. She has never used smokeless tobacco. She reports current alcohol use. She reports that she does not use drugs.  Allergies  Allergen Reactions  . Hydrocodone Rash  . Hydrocodone-Acetaminophen Rash  . Tramadol Palpitations    FamHx Family History  Problem Relation Age of Onset  . Dementia Mother   . Leukemia Child   . Cirrhosis Father     Prior to Admission medications   Medication Sig Start Date End Date Taking? Authorizing Provider  acetaminophen (TYLENOL) 500 MG tablet Take 1,000 mg by mouth every 6 (six) hours as needed (pain).     [provider]  albuterol (PROVENTIL HFA;VENTOLIN HFA) 108 (90 BASE) MCG/ACT inhaler Inhale 2 puffs into the lungs every 6 (six) hours as needed for wheezing or shortness of breath.    [provider]  amLODipine (NORVASC) 2.5 MG tablet Take 2 tablets (5 mg  total) by mouth daily. 05/22/20   Darreld Mclean, PA-C  aspirin 81 MG chewable tablet Chew 81 mg by mouth daily.    [provider]  buPROPion (WELLBUTRIN XL) 300 MG 24 hr tablet Take 300 mg by mouth daily.    [provider]  Cholecalciferol (VITAMIN D) 2000 units CAPS Take 2,000 Units by mouth daily.    [provider]  clobetasol cream (TEMOVATE) 8.46 % Apply 1 application topically 2 (two) times daily as needed (rash).  04/01/16   [provider]  Coenzyme Q10 (CO Q 10) 100 MG CAPS Take 100 mg by mouth daily.     [provider]  [employer] VITAMIN  C 1000 MG tablet Take 2,000 mg by mouth 2 (two) times daily.  06/13/16   [provider]  dicyclomine (BENTYL) 10 MG capsule Take 10 mg by mouth every 4 (four) hours as needed (for abdominal cramps).  02/13/15   [provider]  Evening Primrose Oil 1000 MG CAPS Take 1,000 mg by mouth daily.    [provider]  Fexofenadine HCl (ALLEGRA ALLERGY PO) 1 tablet    [provider]  Fexofenadine HCl (ALLEGRA PO) Take 1 tablet by mouth daily as needed (ALLERGIES).    [provider]  Flaxseed, Linseed, 1000 MG CAPS Take 1,000 mg by mouth 2 (two) times daily.    [provider]  fluticasone (FLONASE) 50 MCG/ACT nasal spray Place 2 sprays into both nostrils daily as needed for allergies or rhinitis.     [provider]  hydrochlorothiazide 25 MG tablet Take 25 mg by mouth daily.    [provider]  HYDROcodone-acetaminophen (NORCO) 10-325 MG tablet hydrocodone 10 mg-acetaminophen 325 mg tablet  TAKE 1 TABLET BY MOUTH EVERY 6 HOURS AS NEEDED FOR PAIN    [provider]  HYDROCORTISONE ACE, RECTAL, 30 MG SUPP Place 30 mg rectally 2 (two) times daily as needed for hemorrhoids. 04/07/16   [provider]  ibuprofen (ADVIL,MOTRIN) 200 MG tablet Take 400-600 mg by mouth 2 (two) times daily as needed (pain).     [provider]   ketotifen (ZADITOR) 0.025 % ophthalmic solution 1 drop into affected eye    [provider]  losartan (COZAAR) 100 MG tablet TAKE 1 TABLET BY MOUTH EVERY DAY Patient taking differently: Take 100 mg by mouth daily. 09/14/17   Martinique, Peter M, MD  magnesium oxide (MAG-OX) 400 MG tablet 1 tablet    [provider]  meclizine (ANTIVERT) 25 MG tablet Take 1 tablet (25 mg total) by mouth 3 (three) times daily as needed for dizziness. 05/02/20   Robinson, [place] N, PA-C  metoprolol succinate (TOPROL-XL) 100 MG 24 hr tablet Take 1 tablet (100 mg total) by mouth 2 (two) times daily. Take with or immediately following a meal. Take 100 mg by mouth 2 (two) times daily. Take with or immediately following a meal. 07/14/18   Martinique, Peter M, MD  montelukast (SINGULAIR) 10 MG tablet Take 10 mg by mouth daily.    [provider]  Multiple Vitamins-Minerals (MULTIPLE VITAMINS/WOMENS PO) 1 tablet    [provider]  nystatin (MYCOSTATIN) 100000 UNIT/ML suspension Take 5 mLs by mouth daily as needed (FLARES, LICHEN PLANTIS).    [provider]  nystatin cream (MYCOSTATIN) Apply 1 application topically 2 (two) times daily as needed for dry skin.    [provider]  omeprazole (PRILOSEC) 20 MG capsule Take 1 capsule (20 mg total) by mouth daily. 11/18/18   Horton, Barbette Hair, MD  ondansetron (ZOFRAN ODT) 4 MG disintegrating tablet Take 1 tablet (4 mg total) by mouth every 8 (eight) hours as needed for nausea or vomiting. 11/18/18   Horton, Barbette Hair, MD  pantoprazole (PROTONIX) 20 MG tablet Take 1 tablet by mouth daily. 05/21/20   [provider]  phenazopyridine (PYRIDIUM) 200 MG tablet Take 200 mg by mouth 3 (three) times daily as needed for pain [place] TRACT PAIN).    [provider]  polyethylene glycol (MIRALAX / GLYCOLAX) 17 g packet Take 17 g by mouth 2 (two) times daily. 11/30/19   Barb Merino, MD  Potassium Chloride ER 20 MEQ TBCR 1  1/2  tablets with food    [provider]  potassium chloride SA (KLOR-CON M20) 20 MEQ tablet TAKE 1&1/2 TABLETS BY MOUTH DAILY. 02/16/20   Martinique, Peter M, MD  pravastatin (PRAVACHOL) 40 MG tablet Take 1 tablet by mouth daily. 05/21/20   [provider]  PREVIDENT 5000 DRY MOUTH 1.1 % GEL dental gel Place 1 application onto teeth at bedtime. 09/01/14   [provider]  Probiotic Product (ALIGN PO) Take 1 capsule by mouth 2 (two) times daily.    [provider]  senna-docusate (SENOKOT-S) 8.6-50 MG tablet Take 1 tablet by mouth 2 (two) times daily. 11/30/19   Barb Merino, MD  valACYclovir (VALTREX) 500 MG tablet INCREASE TO TAKING 1 TABLET 2 TIMES A DAY FOR 3 DAYS WITH OUTBREAK Patient taking differently: Take 500 mg by mouth daily. 03/25/16   Kem Boroughs, FNP  zolpidem (AMBIEN) 10 MG tablet Take 5-10 mg by mouth at bedtime as needed for sleep. 07/15/17   [provider]    Physical Exam: Vitals:   07/01/20 0600 07/01/20 0710 07/01/20 0908 07/01/20 1032  BP: 112/62 (!) 130/53 (!) 159/69 (!) 158/83  Pulse: 70 74 65 64  Resp: 18 16 18 18   Temp:  98.5 F (36.9 C)  98.1 F (36.7 C)  TempSrc:  Oral  Oral  SpO2: 97% 100% 98% 98%  Weight:      Height:        General: 70 y.o. female resting in bed in NAD Eyes: PERRL, normal sclera ENMT: Nares patent w/o discharge, orophaynx clear, dentition normal, ears w/o discharge/lesions/ulcers Neck: Supple, trachea midline Cardiovascular: RRR, +S1, S2, no m/g/r, equal pulses throughout Respiratory: CTABL, no w/r/r, normal WOB GI: BS+, ND, mild TTP to umbilical area, no masses noted, no organomegaly noted MSK: No e/c/c Skin: No rashes, bruises, ulcerations noted Neuro: A&O x 3, no focal deficits Psyc: Appropriate interaction and affect, calm/cooperative  Labs on Admission: I have personally reviewed following labs and imaging studies  CBC: Recent Labs  Lab 07/01/20 0133  WBC 12.9*  NEUTROABS 10.0*   HGB 15.1*  HCT 43.7  MCV 93.6  PLT 379   Basic Metabolic Panel: Recent Labs  Lab 07/01/20 0133  NA 139  K 4.0  CL 102  CO2 27  GLUCOSE 136*  BUN 26*  CREATININE 0.87  CALCIUM 9.7   GFR: Estimated Creatinine Clearance: 61 mL/min (by C-G formula based on SCr of 0.87 mg/dL). Liver Function Tests: Recent Labs  Lab 07/01/20 0133  AST 26  ALT 26  ALKPHOS 68  BILITOT 1.3*  PROT 7.1  ALBUMIN 4.3   Recent Labs  Lab 07/01/20 0133  LIPASE 30   No results for input(s): AMMONIA in the last 168 hours. Coagulation Profile: No results for input(s): INR, PROTIME in the last 168 hours. Cardiac Enzymes: No results for input(s): CKTOTAL, CKMB, CKMBINDEX, TROPONINI in the last 168 hours. BNP (last 3 results) No results for input(s): PROBNP in the last 8760 hours. HbA1C: No results for input(s): HGBA1C in the last 72 hours. CBG: No results for input(s): GLUCAP in the last 168 hours. Lipid Profile: No results for input(s): CHOL, HDL, LDLCALC, TRIG, CHOLHDL, LDLDIRECT in the last 72 hours. Thyroid Function Tests: No results for input(s): TSH, T4TOTAL, FREET4, T3FREE, THYROIDAB in the last 72 hours. Anemia Panel: No results for input(s): VITAMINB12, FOLATE, FERRITIN, TIBC, IRON, RETICCTPCT in the last 72 hours. Urine analysis:    Component Value Date/Time   COLORURINE YELLOW 07/01/2020 0133  APPEARANCEUR CLEAR 07/01/2020 0133   LABSPEC 1.025 07/01/2020 0133   PHURINE 7.0 07/01/2020 0133   GLUCOSEU NEGATIVE 07/01/2020 0133   HGBUR NEGATIVE 07/01/2020 0133   BILIRUBINUR NEGATIVE 07/01/2020 0133   BILIRUBINUR n 10/20/2014 0949   KETONESUR NEGATIVE 07/01/2020 0133   PROTEINUR NEGATIVE 07/01/2020 0133   UROBILINOGEN negative 10/20/2014 0949   UROBILINOGEN 0.2 09/09/2014 0942   NITRITE NEGATIVE 07/01/2020 0133   LEUKOCYTESUR SMALL (A) 07/01/2020 0133    Radiological Exams on Admission: CT ABDOMEN PELVIS WO CONTRAST  Result Date: 07/01/2020 CLINICAL DATA:  4 hours of  abdominal pain in area of hernia surgery EXAM: CT ABDOMEN AND PELVIS WITHOUT CONTRAST TECHNIQUE: Multidetector CT imaging of the abdomen and pelvis was performed following the standard protocol without IV contrast. COMPARISON:  Radiograph 11/30/2019, CT 11/27/2019 FINDINGS: Lower chest: Lung bases demonstrate no acute consolidation or effusion. Normal cardiac size. Hepatobiliary: Hepatic cysts. No calcified gallstone or biliary dilatation Pancreas: Unremarkable. No pancreatic ductal dilatation or surrounding inflammatory changes. Spleen: Normal in size without focal abnormality. Adrenals/Urinary Tract: Adrenal glands are unremarkable. Kidneys are normal, without renal calculi, focal lesion, or hydronephrosis. Bladder is unremarkable. Stomach/Bowel: Stomach nonenlarged. Fluid-filled borderline distended mid small bowel with fecalized segment in the pelvis, series 2, image 65. Suspected transition point in the pelvis in the region of fecalized small bowel. Small bowel distal to this is decompressed. No acute bowel wall thickening. Scattered diverticular disease of the colon without acute wall thickening. Negative appendix Vascular/Lymphatic: Nonaneurysmal aorta. Mild atherosclerosis. No suspicious nodes Reproductive: Uterus and bilateral adnexa are unremarkable. Other: Negative for free air or free fluid Musculoskeletal: Grade 1-2 anterolisthesis L4 on L5. Advanced degenerative changes at L4-L5 and L5-S1. IMPRESSION: 1. Findings suspicious for low-grade small bowel obstruction involving the mid to distal small bowel. 2. Scattered diverticular disease of the colon without acute wall thickening Electronically Signed   By: Donavan Foil M.D.   On: 07/01/2020 02:58    EKG: None obtained in ED  Assessment/Plan Partial SBO     - admitted to inpt, med-surg     - No N/V, she's had BMs yesterday     - ok for CLD, keep on bowel regimen     - general surgery has seen, appreciate assistance     - KUB in AM     - no  NGT for now  IBS     - resume home meds  Anxiety     - resume home meds  HTN     - resume home regimen  HLD     - resume home statin  DVT prophylaxis: lovenox  Code Status: FULL  Family Communication: None at bedside  Consults called: General surgery   Status is: Inpatient  Remains inpatient appropriate because:Inpatient level of care appropriate due to severity of illness   Dispo: The patient is from: Home              Anticipated d/c is to: Home              Patient currently is not medically stable to d/c.   Difficult to place patient No  Time spent coordinating admission: 70 minutes  [facility]  If 7PM-7AM, please contact night-coverage [email or web address]  07/01/2020, 11:41 AM

## 2020-07-01 NOTE — ED Notes (Signed)
Awaiting room assignment. Mae x 4, cont to c/o abd discomfort, alert and oriented, remains NPO

## 2020-07-01 NOTE — ED Notes (Signed)
NPO STATUS: [id number] 06-30-20 PT HAS REMAINED NPO

## 2020-07-01 NOTE — Progress Notes (Signed)
Pt not feeling well enough to take medication. Denies nausea. Denies pain.

## 2020-07-02 ENCOUNTER — Inpatient Hospital Stay (HOSPITAL_COMMUNITY): Payer: Medicare Other

## 2020-07-02 DIAGNOSIS — K56600 Partial intestinal obstruction, unspecified as to cause: Secondary | ICD-10-CM | POA: Diagnosis not present

## 2020-07-02 DIAGNOSIS — K56609 Unspecified intestinal obstruction, unspecified as to partial versus complete obstruction: Secondary | ICD-10-CM

## 2020-07-02 LAB — COMPREHENSIVE METABOLIC PANEL
ALT: 18 U/L (ref 0–44)
AST: 19 U/L (ref 15–41)
Albumin: 3.4 g/dL — ABNORMAL LOW (ref 3.5–5.0)
Alkaline Phosphatase: 48 U/L (ref 38–126)
Anion gap: 6 (ref 5–15)
BUN: 18 mg/dL (ref 8–23)
CO2: 28 mmol/L (ref 22–32)
Calcium: 9 mg/dL (ref 8.9–10.3)
Chloride: 107 mmol/L (ref 98–111)
Creatinine, Ser: 0.76 mg/dL (ref 0.44–1.00)
GFR, Estimated: >60 mL/min (ref >60)
Glucose, Bld: 113 mg/dL — ABNORMAL HIGH (ref 70–99)
Potassium: 3.8 mmol/L (ref 3.5–5.1)
Sodium: 141 mmol/L (ref 135–145)
Total Bilirubin: 1.2 mg/dL (ref 0.3–1.2)
Total Protein: 5.7 g/dL — ABNORMAL LOW (ref 6.5–8.1)

## 2020-07-02 LAB — CBC
HCT: 38 % (ref 36.0–46.0)
Hemoglobin: 12.8 g/dL (ref 12.0–15.0)
MCH: 32 pg (ref 26.0–34.0)
MCHC: 33.7 g/dL (ref 30.0–36.0)
MCV: 95 fL (ref 80.0–100.0)
Platelets: 227 10*3/uL (ref 150–400)
RBC: 4 MIL/uL (ref 3.87–5.11)
RDW: 13 % (ref 11.5–15.5)
WBC: 11.6 10*3/uL — ABNORMAL HIGH (ref 4.0–10.5)
nRBC: 0 % (ref 0.0–0.2)

## 2020-07-02 MED ORDER — POLYETHYLENE GLYCOL 3350 17 G PO PACK: 17.0000 g | PACK | Freq: Two times a day (BID) | ORAL | Status: DC

## 2020-07-02 MED ORDER — MAGNESIUM OXIDE -MG SUPPLEMENT 400 (240 MG) MG PO TABS
400.0000 mg | ORAL_TABLET | Freq: Every day | ORAL | Status: DC
  Administered 2020-07-02: 400 mg via ORAL
  Filled 2020-07-02: qty 1

## 2020-07-02 MED ORDER — ACETAMINOPHEN 325 MG PO TABS
650.0000 mg | ORAL_TABLET | Freq: Four times a day (QID) | ORAL | Status: DC | PRN
  Administered 2020-07-02 (×2): 650 mg via ORAL
  Filled 2020-07-02 (×2): qty 2

## 2020-07-02 NOTE — Discharge Summary (Signed)
Physician Discharge Summary  Cynthia Peters:[id number] DOB: 12/14/1950 DOA: 07/01/2020  PCP: Aletha Halim., PA-C  Admit date: 07/01/2020 Discharge date: 07/03/2020  Recommendations for Outpatient Follow-up:  1. Routine care, follow-up SBO     Discharge Diagnoses: Principal diagnosis is #1 Active Problems:   SBO (small bowel obstruction) ([place])   Discharge Condition: improved Disposition: home  Diet recommendation:  Diet Orders (From admission, onward)    Start     Ordered   07/02/20 0000  Diet general        07/02/20 1944           Filed Weights   07/01/20 0042  Weight: 73.9 kg    HPI/Hospital Course:   70 year old woman PMH multiple SBO's, presenting with abdominal pain.  CT suggested low-grade SBO.  Admitted for same.  Rapidly improved with conservative management.  General surgery cleared for discharge.  Patient tolerated diet.  Hospitalization uncomplicated.  Partial SBO --Treated with conservative management, KUB showed resolution of obstructive pattern.  No abdominal pain.  Tolerating diet.  Passing gas but no bowel movement. -- Tolerated diet in the afternoon, requested discharge home in the evening.  Today's assessment: See progress note same day  Discharge Instructions  Discharge Instructions    Diet general   Complete by: As directed    Discharge instructions   Complete by: As directed    Call your physician or seek immediate medical attention for pain, vomiting, constipation or worsening of condition.   Increase activity slowly   Complete by: As directed      Allergies as of 07/02/2020      Reactions   Hydrocodone Rash   Hydrocodone-acetaminophen Rash   Tramadol Palpitations      Medication List    STOP taking these medications   HYDROcodone-acetaminophen 10-325 MG tablet Commonly known as: NORCO     TAKE these medications   acetaminophen 500 MG tablet Commonly known as: TYLENOL Take 1,000 mg by mouth every 6 (six) hours  as needed (pain).   ALIGN PO Take 1 capsule by mouth 2 (two) times daily.   amLODipine 5 MG tablet Commonly known as: NORVASC Take 5 mg by mouth daily. What changed: Another medication with the same name was removed. Continue taking this medication, and follow the directions you see here.   aspirin 81 MG chewable tablet Chew 81 mg by mouth 3 (three) times a week.   buPROPion 300 MG 24 hr tablet Commonly known as: WELLBUTRIN XL Take 300 mg by mouth daily.   clobetasol cream 0.05 % Commonly known as: TEMOVATE Apply 1 application topically 2 (two) times daily as needed (rash).   Co Q 10 100 MG Caps Take 100 mg by mouth daily.   [employer] Vitamin C 1000 MG tablet Generic drug: ascorbic acid Take 2,000 mg by mouth 2 (two) times daily.   Evening Primrose Oil 1000 MG Caps Take 1,000 mg by mouth daily.   fexofenadine 180 MG tablet Commonly known as: ALLEGRA Take 180 mg by mouth daily.   Flaxseed (Linseed) 1000 MG Caps Take 1,000 mg by mouth daily.   hydrochlorothiazide 25 MG tablet Commonly known as: HYDRODIURIL Take 25 mg by mouth daily.   hydrocortisone cream 1 % Apply 1 application topically 4 (four) times daily as needed for itching.   ibuprofen 200 MG tablet Commonly known as: ADVIL Take 400-600 mg by mouth 2 (two) times daily as needed (pain).   losartan 100 MG tablet Commonly known as: COZAAR TAKE 1 TABLET  BY MOUTH EVERY DAY   magnesium oxide 400 MG tablet Commonly known as: MAG-OX Take 400 mg by mouth daily.   meclizine 25 MG tablet Commonly known as: ANTIVERT Take 1 tablet (25 mg total) by mouth 3 (three) times daily as needed for dizziness.   metoprolol succinate 100 MG 24 hr tablet Commonly known as: TOPROL-XL Take 1 tablet (100 mg total) by mouth 2 (two) times daily. Take with or immediately following a meal. Take 100 mg by mouth 2 (two) times daily. Take with or immediately following a meal.   montelukast 10 MG tablet Commonly known as: SINGULAIR Take  10 mg by mouth daily.   MULTIPLE VITAMINS/WOMENS PO Take 1 tablet by mouth daily.   mupirocin ointment 2 % Commonly known as: BACTROBAN Apply 1 application topically 3 (three) times daily as needed for rash.   pantoprazole 20 MG tablet Commonly known as: PROTONIX Take 20 mg by mouth daily.   phenazopyridine 200 MG tablet Commonly known as: PYRIDIUM Take 200 mg by mouth 3 (three) times daily as needed for pain [place] TRACT PAIN).   polyethylene glycol 17 g packet Commonly known as: MIRALAX / GLYCOLAX Take 17 g by mouth 2 (two) times daily. What changed: when to take this   potassium chloride SA 20 MEQ tablet Commonly known as: Klor-Con M20 TAKE 1&1/2 TABLETS BY MOUTH DAILY. What changed:   how much to take  how to take this  when to take this  additional instructions   pravastatin 40 MG tablet Commonly known as: PRAVACHOL Take 40 mg by mouth daily.   valACYclovir 500 MG tablet Commonly known as: VALTREX INCREASE TO TAKING 1 TABLET 2 TIMES A DAY FOR 3 DAYS WITH OUTBREAK What changed: See the new instructions.   Vitamin D 50 MCG (2000 UT) Caps Take 2,000 Units by mouth daily.      Allergies  Allergen Reactions  . Hydrocodone Rash  . Hydrocodone-Acetaminophen Rash  . Tramadol Palpitations    The results of significant diagnostics from this hospitalization (including imaging, microbiology, ancillary and laboratory) are listed below for reference.    Significant Diagnostic Studies: CT ABDOMEN PELVIS WO CONTRAST  Result Date: 07/01/2020 CLINICAL DATA:  4 hours of abdominal pain in area of hernia surgery EXAM: CT ABDOMEN AND PELVIS WITHOUT CONTRAST TECHNIQUE: Multidetector CT imaging of the abdomen and pelvis was performed following the standard protocol without IV contrast. COMPARISON:  Radiograph 11/30/2019, CT 11/27/2019 FINDINGS: Lower chest: Lung bases demonstrate no acute consolidation or effusion. Normal cardiac size. Hepatobiliary: Hepatic cysts. No  calcified gallstone or biliary dilatation Pancreas: Unremarkable. No pancreatic ductal dilatation or surrounding inflammatory changes. Spleen: Normal in size without focal abnormality. Adrenals/Urinary Tract: Adrenal glands are unremarkable. Kidneys are normal, without renal calculi, focal lesion, or hydronephrosis. Bladder is unremarkable. Stomach/Bowel: Stomach nonenlarged. Fluid-filled borderline distended mid small bowel with fecalized segment in the pelvis, series 2, image 65. Suspected transition point in the pelvis in the region of fecalized small bowel. Small bowel distal to this is decompressed. No acute bowel wall thickening. Scattered diverticular disease of the colon without acute wall thickening. Negative appendix Vascular/Lymphatic: Nonaneurysmal aorta. Mild atherosclerosis. No suspicious nodes Reproductive: Uterus and bilateral adnexa are unremarkable. Other: Negative for free air or free fluid Musculoskeletal: Grade 1-2 anterolisthesis L4 on L5. Advanced degenerative changes at L4-L5 and L5-S1. IMPRESSION: 1. Findings suspicious for low-grade small bowel obstruction involving the mid to distal small bowel. 2. Scattered diverticular disease of the colon without acute wall thickening Electronically Signed  By: Donavan Foil M.D.   On: 07/01/2020 02:58   DG Abd 1 View  Result Date: 07/02/2020 CLINICAL DATA:  70 year old female with history of small-bowel obstruction. EXAM: ABDOMEN - 1 VIEW COMPARISON:  Abdominal radiograph 11/30/2019. FINDINGS: The bowel gas pattern is normal. No radio-opaque calculi or other significant radiographic abnormality are seen. IMPRESSION: Negative. Electronically Signed   By: Vinnie Langton M.D.   On: 07/02/2020 08:10    Microbiology: Recent Results (from the past 240 hour(s))  Resp Panel by RT-PCR (Flu A&B, Covid) Nasopharyngeal Swab     Status: None   Collection Time: 07/01/20  3:14 AM   Specimen: Nasopharyngeal Swab; Nasopharyngeal(NP) swabs in vial  transport medium  Result Value Ref Range Status   SARS Coronavirus 2 by RT PCR NEGATIVE NEGATIVE Final    Comment: (NOTE) SARS-CoV-2 target nucleic acids are NOT DETECTED.  The SARS-CoV-2 RNA is generally detectable in upper respiratory specimens during the acute phase of infection. The lowest concentration of SARS-CoV-2 viral copies this assay can detect is 138 copies/mL. A negative result does not preclude SARS-Cov-2 infection and should not be used as the sole basis for treatment or other patient management decisions. A negative result may occur with  improper specimen collection/handling, submission of specimen other than nasopharyngeal swab, presence of viral mutation(s) within the areas targeted by this assay, and inadequate number of viral copies(<138 copies/mL). A negative result must be combined with clinical observations, patient history, and epidemiological information. The expected result is Negative.  Fact Sheet for Patients:  EntrepreneurPulse.com.au  Fact Sheet for Healthcare Providers:  IncredibleEmployment.be  This test is no t yet approved or cleared by the [place] FDA and  has been authorized for detection and/or diagnosis of SARS-CoV-2 by FDA under an Emergency Use Authorization (EUA). This EUA will remain  in effect (meaning this test can be used) for the duration of the COVID-19 declaration under Section 564(b)(1) of the Act, 21 U.S.C.section 360bbb-3(b)(1), unless the authorization is terminated  or revoked sooner.       Influenza A by PCR NEGATIVE NEGATIVE Final   Influenza B by PCR NEGATIVE NEGATIVE Final    Comment: (NOTE) The Xpert Xpress SARS-CoV-2/FLU/RSV plus assay is intended as an aid in the diagnosis of influenza from Nasopharyngeal swab specimens and should not be used as a sole basis for treatment. Nasal washings and aspirates are unacceptable for Xpert Xpress SARS-CoV-2/FLU/RSV testing.  Fact  Sheet for Patients: EntrepreneurPulse.com.au  Fact Sheet for Healthcare Providers: IncredibleEmployment.be  This test is not yet approved or cleared by the [place] FDA and has been authorized for detection and/or diagnosis of SARS-CoV-2 by FDA under an Emergency Use Authorization (EUA). This EUA will remain in effect (meaning this test can be used) for the duration of the COVID-19 declaration under Section 564(b)(1) of the Act, 21 U.S.C. section 360bbb-3(b)(1), unless the authorization is terminated or revoked.  Performed at [facility], [address] [place], [place] [address]      Labs: Basic Metabolic Panel: Recent Labs  Lab 07/01/20 0133 07/02/20 0458  NA 139 141  K 4.0 3.8  CL 102 107  CO2 27 28  GLUCOSE 136* 113*  BUN 26* 18  CREATININE 0.87 0.76  CALCIUM 9.7 9.0   Liver Function Tests: Recent Labs  Lab 07/01/20 0133 07/02/20 0458  AST 26 19  ALT 26 18  ALKPHOS 68 48  BILITOT 1.3* 1.2  PROT 7.1 5.7*  ALBUMIN 4.3 3.4*   Recent  Labs  Lab 07/01/20 0133  LIPASE 30   CBC: Recent Labs  Lab 07/01/20 0133 07/02/20 0458  WBC 12.9* 11.6*  NEUTROABS 10.0*  --   HGB 15.1* 12.8  HCT 43.7 38.0  MCV 93.6 95.0  PLT 266 227    Active Problems:   SBO (small bowel obstruction) ([place])   Time coordinating discharge: 35 minutes  Signed:  Murray Hodgkins, MD  [facility]  07/03/2020, 4:58 PM

## 2020-07-02 NOTE — Progress Notes (Signed)
Subjective/Chief Complaint: Patient feels better.  Some soreness around the incision.  No nausea or vomiting.  KUB is normal this morning.   Objective: Vital signs in last 24 hours: Temp:  [97.5 F (36.4 C)-98.5 F (36.9 C)] 98.3 F (36.8 C) (05/30 0503) Pulse Rate:  [57-65] 57 (05/30 0503) Resp:  [17-18] 18 (05/30 0503) BP: (101-159)/(54-83) 101/61 (05/30 0503) SpO2:  [94 %-100 %] 95 % (05/30 0503) Last BM Date: 06/30/20  Intake/Output from previous day: 05/29 0701 - 05/30 0700 In: 480 [P.O.:480] Out: 850 [Urine:850] Intake/Output this shift: No intake/output data recorded.  General appearance: alert and cooperative Resp: clear to auscultation bilaterally Cardio: regular rate and rhythm, S1, S2 normal, no murmur, click, rub or gallop GI: Tender around scar.  No rebound or guarding.  No distention.  Lab Results:  Recent Labs    07/01/20 0133 07/02/20 0458  WBC 12.9* 11.6*  HGB 15.1* 12.8  HCT 43.7 38.0  PLT 266 227   BMET Recent Labs    07/01/20 0133 07/02/20 0458  NA 139 141  K 4.0 3.8  CL 102 107  CO2 27 28  GLUCOSE 136* 113*  BUN 26* 18  CREATININE 0.87 0.76  CALCIUM 9.7 9.0   PT/INR No results for input(s): LABPROT, INR in the last 72 hours. ABG No results for input(s): PHART, HCO3 in the last 72 hours.  Invalid input(s): PCO2, PO2  Studies/Results: CT ABDOMEN PELVIS WO CONTRAST  Result Date: 07/01/2020 CLINICAL DATA:  4 hours of abdominal pain in area of hernia surgery EXAM: CT ABDOMEN AND PELVIS WITHOUT CONTRAST TECHNIQUE: Multidetector CT imaging of the abdomen and pelvis was performed following the standard protocol without IV contrast. COMPARISON:  Radiograph 11/30/2019, CT 11/27/2019 FINDINGS: Lower chest: Lung bases demonstrate no acute consolidation or effusion. Normal cardiac size. Hepatobiliary: Hepatic cysts. No calcified gallstone or biliary dilatation Pancreas: Unremarkable. No pancreatic ductal dilatation or surrounding  inflammatory changes. Spleen: Normal in size without focal abnormality. Adrenals/Urinary Tract: Adrenal glands are unremarkable. Kidneys are normal, without renal calculi, focal lesion, or hydronephrosis. Bladder is unremarkable. Stomach/Bowel: Stomach nonenlarged. Fluid-filled borderline distended mid small bowel with fecalized segment in the pelvis, series 2, image 65. Suspected transition point in the pelvis in the region of fecalized small bowel. Small bowel distal to this is decompressed. No acute bowel wall thickening. Scattered diverticular disease of the colon without acute wall thickening. Negative appendix Vascular/Lymphatic: Nonaneurysmal aorta. Mild atherosclerosis. No suspicious nodes Reproductive: Uterus and bilateral adnexa are unremarkable. Other: Negative for free air or free fluid Musculoskeletal: Grade 1-2 anterolisthesis L4 on L5. Advanced degenerative changes at L4-L5 and L5-S1. IMPRESSION: 1. Findings suspicious for low-grade small bowel obstruction involving the mid to distal small bowel. 2. Scattered diverticular disease of the colon without acute wall thickening Electronically Signed   By: Donavan Foil M.D.   On: 07/01/2020 02:58   DG Abd 1 View  Result Date: 07/02/2020 CLINICAL DATA:  70 year old female with history of small-bowel obstruction. EXAM: ABDOMEN - 1 VIEW COMPARISON:  Abdominal radiograph 11/30/2019. FINDINGS: The bowel gas pattern is normal. No radio-opaque calculi or other significant radiographic abnormality are seen. IMPRESSION: Negative. Electronically Signed   By: Vinnie Langton M.D.   On: 07/02/2020 08:10    Anti-infectives: Anti-infectives (From admission, onward)   None      Assessment/Plan: Patient Active Problem List   Diagnosis Date Noted  . Asymptomatic bacteriuria 11/27/2019  . History of TIA (transient ischemic attack) 11/27/2019  . Thoracic aortic aneurysm ([place])  11/27/2019  . Cubital tunnel syndrome 06/09/2019  . Osteoarthritis of  carpometacarpal (CMC) joint of thumb 12/16/2017  . Pain of left thumb 12/16/2017  . Insect sting 09/17/2017  . History of transient ischemic attack (TIA) 07/16/2017  . GERD (gastroesophageal reflux disease) 07/16/2017  . Partial small bowel obstruction ([place]) 07/16/2017  . Arthralgia of right elbow 07/08/2017  . 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 SBO (small bowel obstruction) ([place]) 05/29/2017  . Bilateral leg edema 02/19/2017  . Neck pain 02/19/2017  . Vitamin D deficiency 02/19/2017  . Right radial head fracture 06/17/2016  . Hypertension 05/30/2015  . Hyperlipidemia 05/30/2015  . Recurrent ventral incisional hernia s/p lap repairs 2013, 2016, 2017 10/25/2014  . Anxiety 10/20/2014  . Irritable bowel syndrome 10/20/2014  . Interstitial cystitis 03/15/2013  . Lichen planus 12/19/5206  . Palpitations   . PVC's (premature ventricular contractions)    Advance diet  If she tolerates diet she may be discharged home.  KUB reviewed and appears normal  LOS: 1 day    Marcello Moores A Micole Delehanty 07/02/2020

## 2020-07-02 NOTE — Progress Notes (Signed)
Patient's IV was removed and discharged instructions were given to patient.

## 2020-07-02 NOTE — Progress Notes (Signed)
MD Sarajane Jews was secured chat because patient decided she wants to be discharged.

## 2020-07-02 NOTE — Progress Notes (Signed)
NP Cynthia Peters paged because patient is complaining of a headache and would like Tylenol.

## 2020-07-02 NOTE — Progress Notes (Signed)
PROGRESS NOTE  DEVONY MCGRADY SNK:[id number] DOB: 1950-02-05 DOA: 07/01/2020 PCP: Aletha Halim., PA-C  Brief History   70 year old woman PMH multiple SBO's, presenting with abdominal pain.  CT suggested low-grade SBO.  Admitted for same.  A & P  Partial SBO --Treated with conservative management, KUB shows resolution of obstructive pattern.  No abdominal pain.  Tolerating diet.  Passing gas but no bowel movement. --If tolerates diet this afternoon, bowel movement, discharge held --Appreciate general surgery recommendations     Disposition Plan:  Discussion:   Status is: Inpatient  Dispo: The patient is from: Home              Anticipated d/c is to: Home  DVT prophylaxis: SCDs Start: 07/01/20 1403   Code Status: Full Code Level of care: Med-Surg Family Communication: none  Murray Hodgkins, MD  [facility] Direct contact: see [email or web address] (further directions at bottom of note if needed) 7PM-7AM contact night coverage as at bottom of note 07/02/2020, 2:10 PM  LOS: 1 day   Significant Hospital Events   .    Consults:  .    Procedures:  .   Significant Diagnostic Tests:  Marland Kitchen    Micro Data:  .    Antimicrobials:  .   Interval History/Subjective  CC: f/u abd pain  Feels much better, no abd pain No BM but +flatus Tolerating diet  Objective   Vitals:  Vitals:   07/02/20 0503 07/02/20 0927  BP: 101/61 124/62  Pulse: (!) 57 (!) 59  Resp: 18   Temp: 98.3 F (36.8 C) 98.1 F (36.7 C)  SpO2: 95% 100%    Exam:  Constitutional:   . Appears calm and comfortable ENMT:  . grossly normal hearing  Respiratory:  . CTA bilaterally, no w/r/r.  . Respiratory effort normal.  Cardiovascular:  . RRR, no m/r/g . No LE extremity edema   Abdomen:  . Soft, ntntd Psychiatric:  . Mental status o Mood, affect appropriate  I have personally reviewed the following:   Today's Data  . CMP stable . WBC down to 11.6  Scheduled Meds: . acidophilus   1 capsule Oral Daily  . amLODipine  5 mg Oral Daily  . aspirin  81 mg Oral Once per day on Mon Wed Fri  . buPROPion  300 mg Oral Daily  . hydrochlorothiazide  25 mg Oral Daily  . loratadine  10 mg Oral Daily  . losartan  100 mg Oral Daily  . magnesium oxide  400 mg Oral Daily  . metoprolol succinate  100 mg Oral BID  . montelukast  10 mg Oral Daily  . pantoprazole  20 mg Oral Daily  . polyethylene glycol  17 g Oral BID  . pravastatin  40 mg Oral Daily   Continuous Infusions:  Active Problems:   SBO (small bowel obstruction) ([place])   LOS: 1 day   How to contact the [facility] Attending or Consulting provider 7A - 7P or covering provider during after hours [place], for this patient?  1. Check the care team in [facility] and look for a) attending/consulting TR[facility] provider listed and b) the [facility] team listed 2. Log into [email or web address] and use [place]'s universal password to access. If you do not have the password, please contact the hospital operator. 3. Locate the [facility] provider you are looking for under [facility] and page to a number that you can be directly reached. 4. If you still have difficulty reaching the provider, please page the  DOC (Director on Call) for the Hospitalists listed on amion for assistance.

## 2020-07-02 NOTE — Hospital Course (Signed)
70 year old woman PMH multiple SBO's, presenting with abdominal pain.  CT suggested low-grade SBO.  Admitted for same.  A & P  Partial SBO --Treated with conservative management, KUB shows resolution of obstructive pattern.  No abdominal pain.  Tolerating diet.  Passing gas but no bowel movement. --If tolerates diet this afternoon, bowel movement, discharge held --Appreciate general surgery recommendations

## 2020-07-05 ENCOUNTER — Other Ambulatory Visit: Payer: Self-pay | Admitting: Orthopedic Surgery

## 2020-07-05 DIAGNOSIS — M25521 Pain in right elbow: Secondary | ICD-10-CM

## 2020-07-17 ENCOUNTER — Other Ambulatory Visit: Payer: Medicare PPO

## 2020-07-17 ENCOUNTER — Inpatient Hospital Stay: Admission: RE | Admit: 2020-07-17 | Payer: Medicare PPO | Source: Ambulatory Visit

## 2020-07-31 ENCOUNTER — Telehealth (HOSPITAL_BASED_OUTPATIENT_CLINIC_OR_DEPARTMENT_OTHER): Payer: Self-pay | Admitting: *Deleted

## 2020-07-31 NOTE — Telephone Encounter (Signed)
-----   Message from Melony Overly sent at 07/31/2020  9:08 AM EDT ----- Good Morning,  Mrs. Wann is trying to reschedule her cardiac ct scan. Her referral has closed. Can you help me with that by putting a new order in for her so I can schedule her?   Thanks, [place]

## 2020-08-02 ENCOUNTER — Ambulatory Visit
Admission: RE | Admit: 2020-08-02 | Discharge: 2020-08-02 | Disposition: A | Discharge status: Home / Self Care | Payer: Medicare PPO | Source: Ambulatory Visit | Attending: Orthopedic Surgery | Admitting: Orthopedic Surgery

## 2020-08-02 DIAGNOSIS — M25521 Pain in right elbow: Secondary | ICD-10-CM

## 2020-08-20 ENCOUNTER — Telehealth: Payer: Self-pay | Admitting: *Deleted

## 2020-08-20 NOTE — Telephone Encounter (Signed)
   [facility] Pre-operative Risk Assessment    Patient Name: Cynthia Peters  DOB: 04/13/1950 MRN: [id number]  HEARTCARE STAFF:  - IMPORTANT!!!!!! Under Visit Info/Reason for Call, type in Other and utilize the format Clearance MM/DD/YY or Clearance TBD. Do not use dashes or single digits. - Please review there is not already an duplicate clearance open for this procedure. - If request is for dental extraction, please clarify the # of teeth to be extracted. - If the patient is currently at the dentist's office, call Pre-Op Callback Staff (MA/nurse) to input urgent request.  - If the patient is not currently in the dentist office, please route to the Pre-Op pool.  Request for surgical clearance:  What type of surgery is being performed? RIGHT ELBOW OPEN ARTHROTOMY AND SYNOVECTOMY WITH RADIAL HEAD IMPLANT REMOVAL AND REPAIR RECONSTRUCTION AS NECESSARY INCLUDING LIGAMENTOUS REPAIR   When is this surgery scheduled? TBD  What type of clearance is required (medical clearance vs. Pharmacy clearance to hold med vs. Both)? MEDICAL  Are there any medications that need to be held prior to surgery and how long?  ASA   Practice name and name of physician performing surgery? [facility]; DR. Roseanne Peters  What is the office phone number? [phone]   7.   What is the office fax number? [place]  8.   Anesthesia type (None, local, MAC, general) ? NOT LISTED   [name] 08/20/2020, 5:14 PM  _________________________________________________________________   (provider comments below)

## 2020-08-21 NOTE — Telephone Encounter (Signed)
Attempted to contact as part of preoperative protocol.  Left message to call back.  9:14 AM on 08/22/2018.  Patient is call back

## 2020-08-21 NOTE — Telephone Encounter (Signed)
Cynthia Peters 70 year old female is requesting preoperative cardiac evaluation for right elbow arthrotomy and synovectomy with radial head implant removal and repair.  She was last seen in the clinic on 05/22/2020.  She was doing fairly well at that time but reported some occasional episodes of dizziness with her vertigo.  She denied shortness of breath, orthopnea, and PND.  She had occasional mild lower extremity edema.  She reported rare episodes of atypical chest discomfort that had occurred 3 times over the course of her life.  She denied exertional chest pain.  Coronary CTA was discussed but has not yet been completed.  Echocardiogram 12/09/2019 showed normal LVEF, G1 DD, trivial mitral valve regurgitation, and mild thickening of the aortic valve with mild aortic valve regurgitation.  She was also noted to have mild dilation of her ascending aorta at 40 mm.  Her PMH includes palpitations, thoracic aortic aneurysm, HTN, HLD, TIA in 2013, GERD, IBS, and vertigo.  May her aspirin be held prior to her procedure?  Thank you for your help.  Please direct your response to [facility] preop pool.    Jossie Ng. Maeve Debord NP-C    08/21/2020, 7:17 AM [address] Office [phone] Fax [phone]

## 2020-08-22 ENCOUNTER — Other Ambulatory Visit (HOSPITAL_COMMUNITY): Payer: Self-pay | Admitting: Emergency Medicine

## 2020-08-22 DIAGNOSIS — Z01812 Encounter for preprocedural laboratory examination: Secondary | ICD-10-CM

## 2020-08-22 NOTE — Progress Notes (Signed)
BMP ordered for pre- CTA contrast evaluation

## 2020-08-23 ENCOUNTER — Telehealth (HOSPITAL_COMMUNITY): Payer: Self-pay | Admitting: Emergency Medicine

## 2020-08-23 ENCOUNTER — Telehealth: Payer: Self-pay | Admitting: Student

## 2020-08-23 DIAGNOSIS — Z01812 Encounter for preprocedural laboratory examination: Secondary | ICD-10-CM

## 2020-08-23 NOTE — Telephone Encounter (Signed)
   Cynthia Peters 70 year old female is requesting preoperative cardiac evaluation for right elbow arthrotomy and synovectomy with radial head implant removal and repair.    Her PMH includes palpitations, thoracic aortic aneurysm, HTN, HLD, TIA in 2013, GERD, IBS, and vertigo. Echocardiogram 12/09/2019 showed normal LVEF, G1 DD, trivial mitral valve regurgitation, and mild thickening of the aortic valve with mild aortic valve regurgitation.  She was also noted to have mild dilation of her ascending aorta at 40 mm.  She was last seen in the clinic on 05/22/2020.  She was doing fairly well at that time but reported some occasional episodes of dizziness with her vertigo.  She denied shortness of breath, orthopnea, and PND.  She had occasional mild lower extremity edema.  She reported rare episodes of atypical chest discomfort that had occurred 3 times over the course of her life.  She denied exertional chest pain.  Cardiac CT for evaluation of thoracic aorta and atypical CP scheduled for 08/27/20.  I was able to reach the patient today and reassess sx as of 08/23/2020.  She denies any recent chest pain or shortness of breath.  She reports that she is able to do chores around the house and walk up a flight of stairs without any chest pain or shortness of breath.  If pain from her elbow was not an issue, she would also be able to vacuum without any symptoms concerning for angina.  She is able to achieve at least 4 METS.  Calculated RCRI score 0.4%, placing her at very low risk for her upcoming procedure. The patient was advised that if she develops new symptoms prior to surgery to contact our office to arrange for a follow-up visit, ad she verbalized understanding.  Per Dr. Martinique, ASA may be held before her surgery with restart per the surgical team when felt safe to do so. I updated the pt regarding this recommendation.   We will await the results of her cardiac CT before finalizing her preoperative  evaluation.  I will route this recommendation to the requesting party via [place] fax function to update them regarding ASA recommendations as above and that official clearance to be finalized with cardiac CT.   Please call with questions.  Arvil Chaco, PA-C 08/23/2020, 11:23 AM

## 2020-08-23 NOTE — Telephone Encounter (Signed)
Attempted to call patient regarding upcoming cardiac CT appointment. °Left message on voicemail with name and callback number °Prudie Guthridge RN Navigator Cardiac Imaging °[facility] °[phone] Office °[phone] Cell ° °

## 2020-08-23 NOTE — Telephone Encounter (Signed)
Attempted to contact as part of preoperative protocol.  LVMTCB x1 at 9:48 AM on 08/23/2020.  This is the second total attempt.

## 2020-08-23 NOTE — Telephone Encounter (Signed)
Reaching out to patient to offer assistance regarding upcoming cardiac imaging study; pt verbalizes understanding of appt date/time, parking situation and where to check in, pre-test NPO status and medications ordered, and verified current allergies; name and call back number provided for further questions should they arise Marchia Bond RN [facility] [phone] office [phone] cell  R ARM RESTRICTION DIFFICULT IV START Requested patient get BMP drawn - [facility] location is most convenient for her Holding hctz and allergy meds

## 2020-08-23 NOTE — Telephone Encounter (Signed)
Follow Up:     Patient is returning Jacquelyn's call from today.

## 2020-08-24 ENCOUNTER — Other Ambulatory Visit: Payer: Self-pay

## 2020-08-24 ENCOUNTER — Telehealth (HOSPITAL_COMMUNITY): Payer: Self-pay | Admitting: Emergency Medicine

## 2020-08-24 ENCOUNTER — Emergency Department (HOSPITAL_BASED_OUTPATIENT_CLINIC_OR_DEPARTMENT_OTHER): Payer: Medicare PPO

## 2020-08-24 ENCOUNTER — Emergency Department (HOSPITAL_BASED_OUTPATIENT_CLINIC_OR_DEPARTMENT_OTHER)
Admission: EM | Admit: 2020-08-24 | Discharge: 2020-08-24 | Disposition: A | Discharge status: Home / Self Care | Payer: Medicare PPO | Attending: Emergency Medicine | Admitting: Emergency Medicine

## 2020-08-24 ENCOUNTER — Encounter (HOSPITAL_BASED_OUTPATIENT_CLINIC_OR_DEPARTMENT_OTHER): Payer: Self-pay | Admitting: Emergency Medicine

## 2020-08-24 DIAGNOSIS — J45909 Unspecified asthma, uncomplicated: Secondary | ICD-10-CM | POA: Diagnosis not present

## 2020-08-24 DIAGNOSIS — Z79899 Other long term (current) drug therapy: Secondary | ICD-10-CM | POA: Diagnosis not present

## 2020-08-24 DIAGNOSIS — R0789 Other chest pain: Secondary | ICD-10-CM | POA: Insufficient documentation

## 2020-08-24 DIAGNOSIS — R110 Nausea: Secondary | ICD-10-CM | POA: Diagnosis not present

## 2020-08-24 DIAGNOSIS — K5900 Constipation, unspecified: Secondary | ICD-10-CM | POA: Insufficient documentation

## 2020-08-24 DIAGNOSIS — R1084 Generalized abdominal pain: Secondary | ICD-10-CM | POA: Diagnosis not present

## 2020-08-24 DIAGNOSIS — R300 Dysuria: Secondary | ICD-10-CM | POA: Diagnosis not present

## 2020-08-24 DIAGNOSIS — R1013 Epigastric pain: Secondary | ICD-10-CM | POA: Diagnosis present

## 2020-08-24 DIAGNOSIS — Z85818 Personal history of malignant neoplasm of other sites of lip, oral cavity, and pharynx: Secondary | ICD-10-CM | POA: Insufficient documentation

## 2020-08-24 DIAGNOSIS — I10 Essential (primary) hypertension: Secondary | ICD-10-CM | POA: Diagnosis not present

## 2020-08-24 DIAGNOSIS — Z7982 Long term (current) use of aspirin: Secondary | ICD-10-CM | POA: Diagnosis not present

## 2020-08-24 DIAGNOSIS — Z87891 Personal history of nicotine dependence: Secondary | ICD-10-CM | POA: Insufficient documentation

## 2020-08-24 LAB — CBC WITH DIFFERENTIAL/PLATELET
Abs Immature Granulocytes: 0.03 10*3/uL (ref 0.00–0.07)
Basophils Absolute: 0.1 10*3/uL (ref 0.0–0.1)
Basophils Relative: 1 %
Eosinophils Absolute: 0.5 10*3/uL (ref 0.0–0.5)
Eosinophils Relative: 4 %
HCT: 40.6 % (ref 36.0–46.0)
Hemoglobin: 14.4 g/dL (ref 12.0–15.0)
Immature Granulocytes: 0 %
Lymphocytes Relative: 8 %
Lymphs Abs: 1 10*3/uL (ref 0.7–4.0)
MCH: 32.4 pg (ref 26.0–34.0)
MCHC: 35.5 g/dL (ref 30.0–36.0)
MCV: 91.2 fL (ref 80.0–100.0)
Monocytes Absolute: 0.9 10*3/uL (ref 0.1–1.0)
Monocytes Relative: 7 %
Neutro Abs: 10.3 10*3/uL — ABNORMAL HIGH (ref 1.7–7.7)
Neutrophils Relative %: 80 %
Platelets: 218 10*3/uL (ref 150–400)
RBC: 4.45 MIL/uL (ref 3.87–5.11)
RDW: 12.3 % (ref 11.5–15.5)
WBC: 12.7 10*3/uL — ABNORMAL HIGH (ref 4.0–10.5)
nRBC: 0 % (ref 0.0–0.2)

## 2020-08-24 LAB — TROPONIN I (HIGH SENSITIVITY)
Troponin I (High Sensitivity): 5 ng/L (ref <18)
Troponin I (High Sensitivity): 6 ng/L (ref <18)

## 2020-08-24 LAB — COMPREHENSIVE METABOLIC PANEL
ALT: 22 U/L (ref 0–44)
AST: 25 U/L (ref 15–41)
Albumin: 4.1 g/dL (ref 3.5–5.0)
Alkaline Phosphatase: 64 U/L (ref 38–126)
Anion gap: 9 (ref 5–15)
BUN: 26 mg/dL — ABNORMAL HIGH (ref 8–23)
CO2: 26 mmol/L (ref 22–32)
Calcium: 9.2 mg/dL (ref 8.9–10.3)
Chloride: 103 mmol/L (ref 98–111)
Creatinine, Ser: 0.88 mg/dL (ref 0.44–1.00)
GFR, Estimated: >60 mL/min (ref >60)
Glucose, Bld: 137 mg/dL — ABNORMAL HIGH (ref 70–99)
Potassium: 4 mmol/L (ref 3.5–5.1)
Sodium: 138 mmol/L (ref 135–145)
Total Bilirubin: 1.3 mg/dL — ABNORMAL HIGH (ref 0.3–1.2)
Total Protein: 6.8 g/dL (ref 6.5–8.1)

## 2020-08-24 LAB — URINALYSIS, ROUTINE W REFLEX MICROSCOPIC
Bilirubin Urine: NEGATIVE
Glucose, UA: NEGATIVE mg/dL
Hgb urine dipstick: NEGATIVE
Ketones, ur: NEGATIVE mg/dL
Leukocytes,Ua: NEGATIVE
Nitrite: NEGATIVE
Protein, ur: NEGATIVE mg/dL
Specific Gravity, Urine: 1.025 (ref 1.005–1.030)
pH: 6.5 (ref 5.0–8.0)

## 2020-08-24 LAB — LACTIC ACID, PLASMA: Lactic Acid, Venous: 1 mmol/L (ref 0.5–1.9)

## 2020-08-24 LAB — LIPASE, BLOOD: Lipase: 32 U/L (ref 11–51)

## 2020-08-24 MED ORDER — SODIUM CHLORIDE 0.9 % IV BOLUS
1000.0000 mL | Freq: Once | INTRAVENOUS | Status: AC
  Administered 2020-08-24: 1000 mL via INTRAVENOUS

## 2020-08-24 MED ORDER — ONDANSETRON HCL 4 MG/2ML IJ SOLN
4.0000 mg | Freq: Once | INTRAMUSCULAR | Status: AC
  Administered 2020-08-24: 4 mg via INTRAVENOUS
  Filled 2020-08-24: qty 2

## 2020-08-24 MED ORDER — FENTANYL CITRATE (PF) 100 MCG/2ML IJ SOLN
50.0000 ug | Freq: Once | INTRAMUSCULAR | Status: AC
  Administered 2020-08-24: 50 ug via INTRAVENOUS
  Filled 2020-08-24: qty 2

## 2020-08-24 MED ORDER — IOHEXOL 350 MG/ML SOLN
100.0000 mL | Freq: Once | INTRAVENOUS | Status: AC | PRN
  Administered 2020-08-24: 100 mL via INTRAVENOUS

## 2020-08-24 NOTE — Telephone Encounter (Signed)
error 

## 2020-08-24 NOTE — ED Provider Notes (Signed)
[facility] EMERGENCY DEPARTMENT Provider Note   CSN: [id number] Arrival date & time: 08/24/20  0941     History Chief Complaint  Patient presents with   Abdominal Pain    Cynthia Peters is a 70 y.o. female.  The history is provided by the patient and medical records. No language interpreter was used.  Abdominal Pain Pain location:  Epigastric and periumbilical Pain quality: aching and sharp   Pain quality: no fullness   Pain radiates to:  Chest and back Pain severity:  Moderate Onset quality:  Sudden Timing:  Constant Progression:  Unchanged Chronicity:  Recurrent Relieved by:  Nothing Worsened by:  Palpation Ineffective treatments:  None tried Associated symptoms: chest pain, constipation (chronic), dysuria and nausea   Associated symptoms: no chills, no cough, no diarrhea, no fatigue, no fever, no shortness of breath and no vomiting       Past Medical History:  Diagnosis Date   Anemia    history in the past, not on a regular basis   Arthritis    Bronchitis    Cataracts, bilateral    immature   Claustrophobia    takes Valium daily as needed   Complication of anesthesia    2013 SURGERY vocal cord bothering pt, used smaller ent tube after 2 hernia repair, no problem after . " I woke up during my MRI and colonoscopy."        Depression    takes Wellbutrin daily   Dry eye    Dry mouth    GERD (gastroesophageal reflux disease)    takes Omeprazole daily    H/O hiatal hernia    Headache    migraines with flashing lights    History of blood clots 40 yrs    leg    History of echocardiogram 2013   a. Echo (05/2011):  Mild LVH, EF 65-70%, no WMA, Gr 1 DD, mild AI.   History of MRSA infection    History of staph infection    History of stress test 2010   a. ETT-Echo in 2010 was normal   History of TIA (transient ischemic attack) 2013   a. Carotid US (05/2011):  No ICA stenosis, pt unsure of this   Hyperlipidemia    takes Simvastatin daily     Hypertension    takes  Losartan daily   IBS (irritable bowel syndrome)    takes Bentyl daily as needed   Insomnia    takes Ambien nightly   Interstitial cystitis    Lichen planus    Mild asthma    Albuterol inhaler as needed   Palpitations    Event monitor in 2007 with rare PVCs // takes Metoprolol daily   PVC (premature ventricular contraction)    Seasonal allergies    takes Claritin daily as needed.Takes Singulair daily as needed.   TIA (transient ischemic attack) 05/27/2011   Urethral stricture    Vertigo    takes Meclizine daily as needed    Patient Active Problem List   Diagnosis Date Noted   Asymptomatic bacteriuria 11/27/2019   History of TIA (transient ischemic attack) 11/27/2019   Thoracic aortic aneurysm ([place]) 11/27/2019   Cubital tunnel syndrome 06/09/2019   Osteoarthritis of carpometacarpal (CMC) joint of thumb 12/16/2017   Pain of left thumb 12/16/2017   Insect sting 09/17/2017   History of transient ischemic attack (TIA) 07/16/2017   GERD (gastroesophageal reflux disease) 07/16/2017   Partial small bowel obstruction ([place]) 07/16/2017   Arthralgia of right elbow 07/08/2017  Benign neoplasm of tonsil 06/24/2017   SBO (small bowel obstruction) ([place]) 05/29/2017   Bilateral leg edema 02/19/2017   Neck pain 02/19/2017   Vitamin D deficiency 02/19/2017   Right radial head fracture 06/17/2016   Hypertension 05/30/2015   Hyperlipidemia 05/30/2015   Recurrent ventral incisional hernia s/p lap repairs 2013, 2016, 2017 10/25/2014   Anxiety 10/20/2014   Irritable bowel syndrome 10/20/2014   Interstitial cystitis [id number]   Lichen planus [id number]   Palpitations    PVC's (premature ventricular contractions)     Past Surgical History:  Procedure Laterality Date   CESAREAN SECTION     COLONOSCOPY WITH PROPOFOL N/A 08/10/2012   Procedure: COLONOSCOPY WITH PROPOFOL;  Surgeon: Garlan Fair, MD;  Location: [facility];  Service: Endoscopy;  Laterality: N/A;    CYSTOSCOPY WITH URETHRAL DILATATION N/A 02/18/2013   Procedure: CYSTOSCOPY WITH URETHRAL DILATATION ( NO BALLOON) AND HYDRODISTENSION;  Surgeon: Alexis Frock, MD;  Location: [facility];  Service: Urology;  Laterality: N/A;   ENDOMETRIAL ABLATION W/ HYDROTHERMABLATOR  09-27-2002   INCISIONAL HERNIA REPAIR N/A 10/25/2014   Procedure: HERNIA REPAIR INCISIONAL;  Surgeon: Ralene Ok, MD;  Location: [facility];  Service: General;  Laterality: N/A;   [place] N/A 03/26/2015   Procedure: LAPAROSCOPIC INCISIONAL HERNIA;  Surgeon: Ralene Ok, MD;  Location: [facility];  Service: General;  Laterality: N/A;   INSERTION OF MESH N/A 10/25/2014   Procedure: INSERTION OF MESH;  Surgeon: Ralene Ok, MD;  Location: [facility];  Service: General;  Laterality: N/A;   LAPAROSCOPIC LYSIS OF ADHESIONS N/A 03/26/2015   Procedure: LAPAROSCOPIC LYSIS OF ADHESIONS;  Surgeon: Ralene Ok, MD;  Location: [facility];  Service: General;  Laterality: N/A;   LAPAROTOMY N/A 10/25/2014   Procedure: EXPLORATORY LAPAROTOMY;  Surgeon: Ralene Ok, MD;  Location: [facility];  Service: General;  Laterality: N/A;   LYSIS OF ADHESION N/A 10/25/2014   Procedure: LYSIS OF ADHESIONS ;  Surgeon: Ralene Ok, MD;  Location: [facility];  Service: General;  Laterality: N/A;   MUCOSAL ADVANCEMENT FLAP N/A 10/25/2014   Procedure: MUCOSAL ADVANCEMENT FLAP;  Surgeon: Ralene Ok, MD;  Location: [facility];  Service: General;  Laterality: N/A;   ORIF RADIAL FRACTURE Right 06/17/2016   Procedure: right radial head arthroplasty with ligament repair, reconstruciton;  Surgeon: Roseanne Kaufman, MD;  Location: [facility];  Service: Orthopedics;  Laterality: Right;   RADIAL HEAD ARTHROPLASTY Right 06/17/2016   TOOTH EXTRACTION     TRANSTHORACIC ECHOCARDIOGRAM  05-26-2011   MILD LVH/  EF [id number]  GRADE I DIASTOLIC DYSFUNCTION/  MILD AR//   06-20-2008  NOMRAL STRESS ECHO   UMBILICAL HERNIA REPAIR  2013   AND REVISION THE SAME YEAR W/ MESH    WISDOM TOOTH EXTRACTION  AGE 67     OB History     Gravida  3   Para  3   Term  3   Preterm      AB      Living  2      SAB      IAB      Ectopic      Multiple      Live Births              Family History  Problem Relation Age of Onset   Dementia Mother    Leukemia Child    Cirrhosis Father     Social History   Tobacco Use   Smoking status: Former    Packs/day:  0.25    Years: 2.00    Pack years: 0.50    Types: Cigarettes    Quit date: 02/04/1976    Years since quitting: 44.5   Smokeless tobacco: Never  Vaping Use   Vaping Use: Never used  Substance Use Topics   Alcohol use: Yes    Comment: occasional   Drug use: No    Home Medications Prior to Admission medications   Medication Sig Start Date End Date Taking? Authorizing Provider  acetaminophen (TYLENOL) 500 MG tablet Take 1,000 mg by mouth every 6 (six) hours as needed (pain).     [provider]  amLODipine (NORVASC) 5 MG tablet Take 5 mg by mouth daily. 05/21/20   [provider]  aspirin 81 MG chewable tablet Chew 81 mg by mouth 3 (three) times a week.    [provider]  buPROPion (WELLBUTRIN XL) 300 MG 24 hr tablet Take 300 mg by mouth daily.    [provider]  Cholecalciferol (VITAMIN D) 2000 units CAPS Take 2,000 Units by mouth daily.    [provider]  clobetasol cream (TEMOVATE) [id number] % Apply 1 application topically 2 (two) times daily as needed (rash).  04/01/16   [provider]  Coenzyme Q10 (CO Q 10) 100 MG CAPS Take 100 mg by mouth daily.     [provider]  CVS VITAMIN C 1000 MG tablet Take 2,000 mg by mouth 2 (two) times daily.  06/13/16   [provider]  Evening Primrose Oil 1000 MG CAPS Take 1,000 mg by mouth daily.    [provider]  fexofenadine (ALLEGRA) 180 MG tablet Take 180 mg by mouth daily.    [provider]  Flaxseed, Linseed, 1000 MG CAPS Take 1,000 mg by mouth daily.     [provider]  hydrochlorothiazide 25 MG tablet Take 25 mg by mouth daily.    [provider]  hydrocortisone cream 1 % Apply 1 application topically 4 (four) times daily as needed for itching.    [provider]  ibuprofen (ADVIL,MOTRIN) 200 MG tablet Take 400-600 mg by mouth 2 (two) times daily as needed (pain).     [provider]  losartan (COZAAR) 100 MG tablet TAKE 1 TABLET BY MOUTH EVERY DAY Patient taking differently: Take 100 mg by mouth daily. 09/14/17   Martinique, Peter M, MD  magnesium oxide (MAG-OX) 400 MG tablet Take 400 mg by mouth daily.    [provider]  meclizine (ANTIVERT) 25 MG tablet Take 1 tablet (25 mg total) by mouth 3 (three) times daily as needed for dizziness. 05/02/20   Robinson, [place] N, PA-C  metoprolol succinate (TOPROL-XL) 100 MG 24 hr tablet Take 1 tablet (100 mg total) by mouth 2 (two) times daily. Take with or immediately following a meal. Take 100 mg by mouth 2 (two) times daily. Take with or immediately following a meal. 07/14/18   Martinique, Peter M, MD  montelukast (SINGULAIR) 10 MG tablet Take 10 mg by mouth daily.    [provider]  Multiple Vitamins-Minerals (MULTIPLE VITAMINS/WOMENS PO) Take 1 tablet by mouth daily.    [provider]  mupirocin ointment (BACTROBAN) 2 % Apply 1 application topically 3 (three) times daily as needed for rash. 06/20/20   [provider]  pantoprazole (PROTONIX) 20 MG tablet Take 20 mg by mouth daily. 05/21/20   [provider]  phenazopyridine (PYRIDIUM) 200 MG tablet Take 200 mg by mouth 3 (three) times daily as  needed for pain [place] TRACT PAIN).    [provider]  polyethylene glycol (MIRALAX / GLYCOLAX) 17 g packet Take 17 g by mouth 2 (two) times daily. Patient taking differently: Take 17 g by mouth daily. 11/30/19   Barb Merino, MD  potassium chloride SA (KLOR-CON M20) 20 MEQ tablet TAKE 1&1/2 TABLETS BY MOUTH DAILY. Patient  taking differently: Take 30 mEq by mouth daily. 02/16/20   [place]ique, Peter M, MD  pravastatin (PRAVACHOL) 40 MG tablet Take 40 mg by mouth daily. 05/21/20   [provider]  Probiotic Product (ALIGN PO) Take 1 capsule by mouth 2 (two) times daily.    [provider]  valACYclovir (VALTREX) 500 MG tablet INCREASE TO TAKING 1 TABLET 2 TIMES A DAY FOR 3 DAYS WITH OUTBREAK Patient taking differently: Take 500 mg by mouth daily. 03/25/16   Kem Boroughs, FNP    Allergies    Hydrocodone, Hydrocodone-acetaminophen, and Tramadol  Review of Systems   Review of Systems  Constitutional:  Negative for chills, fatigue and fever.  HENT:  Negative for congestion.   Respiratory:  Negative for cough, chest tightness and shortness of breath.   Cardiovascular:  Positive for chest pain. Negative for palpitations and leg swelling.  Gastrointestinal:  Positive for abdominal distention, abdominal pain, constipation (chronic) and nausea. Negative for diarrhea and vomiting.  Genitourinary:  Positive for dysuria. Negative for flank pain and frequency.  Musculoskeletal:  Positive for back pain (resolved now). Negative for neck pain and neck stiffness.  Skin:  Negative for rash and wound.  Neurological:  Negative for dizziness, weakness, light-headedness, numbness and headaches.  Psychiatric/Behavioral:  Negative for agitation and confusion.   All other systems reviewed and are negative.  Physical Exam Updated Vital Signs BP (!) 166/77 (BP Location: Right Arm)   Pulse 64   Temp 97.7 F (36.5 C) (Oral)   Resp 18   Ht '5\' 4"'$  (1.626 m)   Wt 72.6 kg   SpO2 99%   BMI 27.46 kg/m   Physical Exam Vitals and nursing note reviewed.  Constitutional:      General: She is not in acute distress.    Appearance: She is well-developed. She is not ill-appearing, toxic-appearing or diaphoretic.  HENT:     Head: Normocephalic and atraumatic.     Mouth/Throat:     Pharynx: No pharyngeal swelling.  Eyes:      Extraocular Movements: Extraocular movements intact.     Conjunctiva/sclera: Conjunctivae normal.     Pupils: Pupils are equal, round, and reactive to light.  Cardiovascular:     Rate and Rhythm: Normal rate and regular rhythm.     Heart sounds: Normal heart sounds. No murmur heard. Pulmonary:     Effort: Pulmonary effort is normal. No respiratory distress.     Breath sounds: Normal breath sounds. No wheezing, rhonchi or rales.  Chest:     Chest wall: No tenderness.  Abdominal:     General: Bowel sounds are normal. There is distension.     Palpations: Abdomen is soft.     Tenderness: There is abdominal tenderness in the epigastric area and periumbilical area. There is no right CVA tenderness, left CVA tenderness, guarding or rebound.  Musculoskeletal:     Cervical back: Neck supple.  Skin:    General: Skin is warm and dry.     Capillary Refill: Capillary refill takes less than 2 seconds.  Neurological:     General: No focal deficit present.     Mental Status:  She is alert.  Psychiatric:        Mood and Affect: Mood normal.    ED Results / Procedures / Treatments   Labs (all labs ordered are listed, but only abnormal results are displayed) Labs Reviewed  CBC WITH DIFFERENTIAL/PLATELET - Abnormal; Notable for the following components:      Result Value   WBC 12.7 (*)    Neutro Abs 10.3 (*)    All other components within normal limits  COMPREHENSIVE METABOLIC PANEL - Abnormal; Notable for the following components:   Glucose, Bld 137 (*)    BUN 26 (*)    Total Bilirubin 1.3 (*)    All other components within normal limits  URINE CULTURE  LIPASE, BLOOD  LACTIC ACID, PLASMA  URINALYSIS, ROUTINE W REFLEX MICROSCOPIC  TROPONIN I (HIGH SENSITIVITY)  TROPONIN I (HIGH SENSITIVITY)    EKG EKG Interpretation  Date/Time:  Friday August 24 2020 10:26:09 EDT Ventricular Rate:  64 PR Interval:  186 QRS Duration: 95 QT Interval:  394 QTC Calculation: 407 R Axis:   -70 Text  Interpretation: Sinus rhythm Left anterior fascicular block Anteroseptal infarct, age indeterminate When compared to prior, similar appearance. NO STEMI Confirmed by Antony Blackbird [phone]) on 08/24/2020 10:48:59 AM  Radiology CT Angio Chest/Abd/Pel for Dissection W and/or Wo Contrast  Result Date: 08/24/2020 CLINICAL DATA:  Severe upper abdomen chest pain going into back, known aneurysm, history of multiple small bowel obstructions, mid abdominal pain, question aortic dissection. History GERD, hypertension former smoker EXAM: CT ANGIOGRAPHY CHEST, ABDOMEN AND PELVIS TECHNIQUE: Non-contrast CT of the chest was initially obtained. Multidetector CT imaging through the chest, abdomen and pelvis was performed using the standard protocol during bolus administration of intravenous contrast. Multiplanar reconstructed images and MIPs were obtained and reviewed to evaluate the vascular anatomy. CONTRAST:  19m OMNIPAQUE IOHEXOL 350 MG/ML SOLN IV COMPARISON:  CT abdomen and pelvis 07/01/2020, CT angio chest 06/02/2008 FINDINGS: CTA CHEST FINDINGS Cardiovascular: Atherosclerotic calcifications aorta. Aneurysmal dilatation ascending thoracic aorta 4.0 cm transverse image 25, previously 3.9 cm. No intramural hematoma on precontrast imaging. Heart size normal. No pericardial effusion. Following contrast, normal vascular enhancement is seen without evidence of aortic dissection. Pulmonary arteries patent. No evidence of pulmonary embolism. Mediastinum/Nodes: Esophagus unremarkable. Base of cervical region normal appearance. Borderline enlarged precarinal lymph node 11 mm short axis containing a punctate calcification. No additional thoracic adenopathy. Lungs/Pleura: 4 mm noncalcified LEFT lower lobe nodule image hip 39 unchanged since 06/02/2008. No pulmonary infiltrate, pleural effusion, pneumothorax or additional mass/nodule. Musculoskeletal: No acute osseous findings. Review of the MIP images confirms the above findings.  CTA ABDOMEN AND PELVIS FINDINGS VASCULAR Aorta: Normal caliber without aneurysm or dissection. Mild scattered atherosclerotic plaque. Celiac: Widely patent SMA: Widely patent Renals: Patent bilaterally.  BILATERAL single renal arteries. IMA: Patent Inflow: Patent Veins: Unopacified at imaging Review of the MIP images confirms the above findings. NON-VASCULAR Hepatobiliary: Several hepatic cysts largest 2.6 cm diameter. Gallbladder unremarkable. Pancreas: Normal appearance Spleen: Normal appearance.  Two small splenules. Adrenals/Urinary Tract: Adrenal glands, kidneys, ureters, and bladder normal appearance Stomach/Bowel: Few sigmoid diverticula. No evidence of diverticulitis. Slightly prominent stool in transverse and ascending colon. Normal appendix. Few proximal small bowel loops in the mid abdomen are slightly prominent versus distal loops though only 2.5 cm diameter, without definite evidence of obstruction and less distended than on previous exam. Previously identified fecalized loop in the pelvis is no longer seen. Stomach unremarkable. Lymphatic: No adenopathy Reproductive: Atrophic uterus and ovaries Other: No free  air or free fluid. Questionable RIGHT inguinal hernia containing fat. No definite inflammatory process. Musculoskeletal: Grade 1 spondylolysis L4-L5 secondary to degenerative disc and facet disease. Additional degenerative disc disease changes L5-S1. Review of the MIP images confirms the above findings. IMPRESSION: Aneurysmal dilatation of the ascending thoracic aorta 4.0 cm transverse, previously 3.9 cm. Recommend annual imaging followup by CTA or MRA. This recommendation follows 2010 ACCF/AHA/AATS/ACR/ASA/SCA/SCAI/SIR/STS/SVM Guidelines for the Diagnosis and Management of Patients with Thoracic Aortic Disease. Circulation. 2010; [id number]. Aortic aneurysm NOS (ICD10-I71.9) No evidence of aortic dissection or pulmonary embolism. Hepatic cysts. Few sigmoid diverticula without evidence of  diverticulitis. Questionable RIGHT inguinal hernia containing fat. Few upper normal caliber small bowel loops in the upper abdomen without definite evidence of bowel obstruction. No acute intrathoracic, intra-abdominal or intrapelvic abnormalities. Aortic Atherosclerosis (ICD10-I70.0). Electronically Signed   By: Lavonia Dana M.D.   On: 08/24/2020 12:45    Procedures Procedures   Medications Ordered in ED Medications  fentaNYL (SUBLIMAZE) injection 50 mcg (50 mcg Intravenous Given 08/24/20 1040)  ondansetron (ZOFRAN) injection 4 mg (4 mg Intravenous Given 08/24/20 1040)  sodium chloride 0.9 % bolus 1,000 mL (0 mLs Intravenous Stopped 08/24/20 1245)  iohexol (OMNIPAQUE) 350 MG/ML injection 100 mL (100 mLs Intravenous Contrast Given 08/24/20 1141)    ED Course  I have reviewed the triage vital signs and the nursing notes.  Pertinent labs & imaging results that were available during my care of the patient were reviewed by me and considered in my medical decision making (see chart for details).    MDM Rules/Calculators/A&P                           KULSOOM BYSTROM is a 70 y.o. female with a past medical history significant for multiple small bowel obstructions, prior TIA, thoracic aortic aneurysm, GERD, hypertension, hyperlipidemia, dry mouth, asthma, interstitial cystitis, prior urinary tract infection, vertigo, and chronic right elbow pain who presents with sudden onset severe pain in her upper abdomen, chest, nausea, and some dysuria.  Patient reports that she was doing well in regards to the abdominal and chest discomfort until earlier this morning when she woke up with severe pain.  She reports it is a 7 out of 10 currently.  It is a aching and sharp pain in her upper abdomen going into her chest as well as going to her back.  He she reports the back pain component was late last night but is not as bad today.  She reports no pain in her legs.  She reports has had some dysuria but is  interstitial cystitis and is not sure if that is UTI or just the chronic trouble.  She reports she has had small bowel obstruction several times in the past and reports this does feel somewhat similar.  She is however passing gas and had a small bowel movement today.  She reports no nausea no vomiting.  Denies fevers, chills, shortness of breath, or cough.  No COVID or flu exposures to her knowledge.  She feels her abdomen is slightly more distended than baseline.  On exam, abdomen is tender primarily epigastric comparable ankle area.  Surgical scar seen.  Chest is nontender.  Bowel sounds were appreciated and lungs were clear.  Back was nontender.  No CVA tenderness.  Good pulses in extremities and normal sensation strength throughout.  Clinically I do suspect she may have recurrent small bowel obstruction but due to her aneurysm and  the pain going from her abdomen to her chest into her back with blood pressure around 166 on arrival, I do feel we need to rule out dissection or worsen aneurysm.  We will get a CT scan to look for both these conditions as well as checking urinalysis to look for UTI given her dysuria.  We will also get screening labs.  We will give her pain medicine, nausea, and fluids.  Anticipate reassessment after work-up to determine disposition.    Final Clinical Impression(s) / ED Diagnoses Final diagnoses:  Generalized abdominal pain    Rx / DC Orders ED Discharge Orders     None       Clinical Impression: 1. Generalized abdominal pain     Disposition: Discharge  Condition: Good  I have discussed the results, Dx and Tx plan with the pt(& family if present). He/she/they expressed understanding and agree(s) with the plan. Discharge instructions discussed at great length. Strict return precautions discussed and pt &/or family have verbalized understanding of the instructions. No further questions at time of discharge.    New Prescriptions   No medications on file     Follow Up: Amie Critchley [address] [place] [address] [phone]     [place] [address] [id number] [place] [phone]        Kensey Luepke, Gwenyth Allegra, MD 08/24/20 1436

## 2020-08-24 NOTE — Discharge Instructions (Addendum)
Your work-up today did not show convincing evidence of persistent bowel obstruction.  Your other work-up was also reassuring as we discussed.  We feel you are safe for discharge home but please rest and stay hydrated.  Please follow-up with your primary doctor.  If any symptoms change or worsen, please turn to the nearest emergency department.  Please follow-up with them in regards to further blood pressure medication titration.

## 2020-08-24 NOTE — ED Triage Notes (Signed)
Reports abdominal pain that goes from her hernia down to lower abdomen.  Denies any n/v.  Endorses hx of multiple bowel obstructions.

## 2020-08-25 LAB — URINE CULTURE: Culture: <10000 — AB

## 2020-08-27 ENCOUNTER — Ambulatory Visit (HOSPITAL_COMMUNITY)
Admission: RE | Admit: 2020-08-27 | Discharge: 2020-08-27 | Disposition: A | Discharge status: Home / Self Care | Payer: Medicare PPO | Source: Ambulatory Visit | Attending: Student | Admitting: Student

## 2020-08-27 ENCOUNTER — Other Ambulatory Visit: Payer: Self-pay

## 2020-08-27 ENCOUNTER — Encounter (HOSPITAL_COMMUNITY): Payer: Self-pay

## 2020-08-27 DIAGNOSIS — R0789 Other chest pain: Secondary | ICD-10-CM | POA: Diagnosis not present

## 2020-08-27 DIAGNOSIS — I2510 Atherosclerotic heart disease of native coronary artery without angina pectoris: Secondary | ICD-10-CM | POA: Diagnosis not present

## 2020-08-27 DIAGNOSIS — I719 Aortic aneurysm of unspecified site, without rupture: Secondary | ICD-10-CM | POA: Diagnosis not present

## 2020-08-27 DIAGNOSIS — I714 Abdominal aortic aneurysm, without rupture: Secondary | ICD-10-CM | POA: Insufficient documentation

## 2020-08-27 DIAGNOSIS — M79602 Pain in left arm: Secondary | ICD-10-CM | POA: Diagnosis present

## 2020-08-27 MED ORDER — NITROGLYCERIN 0.4 MG SL SUBL
SUBLINGUAL_TABLET | SUBLINGUAL | Status: AC
  Administered 2020-08-27: 0.8 mg via SUBLINGUAL
  Filled 2020-08-27: qty 2

## 2020-08-27 MED ORDER — NITROGLYCERIN 0.4 MG SL SUBL: 0.8000 mg | SUBLINGUAL_TABLET | Freq: Once | SUBLINGUAL | Status: AC

## 2020-08-27 MED ORDER — IOHEXOL 350 MG/ML SOLN
95.0000 mL | Freq: Once | INTRAVENOUS | Status: AC | PRN
  Administered 2020-08-27: 95 mL via INTRAVENOUS

## 2020-08-31 NOTE — Telephone Encounter (Incomplete Revision)
Spoke with pt and reviewed CT, BP's and diets. Spent 30 min on phone doing this.she has multiple questions. All questions answered. She was wondering if she was cleared for surgery. I believe she is and will forward question to Callie/Lindsay if she is cleared. And then forward to surgeon's office.  Pt would like low salt/cholesterol diet mailed to her. I will also mail the BP log to make it easier for her. Will forward to have question answered. She will increase exercise regimen as tolerated. I have schedule next available appointment for pt in Jan 05-2021 with MD.

## 2020-08-31 NOTE — Telephone Encounter (Addendum)
Spoke with pt and reviewed CT, BP's and diets. Spent 30 min on phone doing this.she has multiple questions. All questions answered. She was wondering if she was cleared for surgery. I believe she is and will forward question to Callie/Lindsay if she is cleared. And then forward to surgeon's office.  Pt would like low salt/cholesterol diet mailed to her. I will also mail the BP log to make it easier for her. Will forward to have question answered. She will increase exercise regimen as tolerated. I have schedule next available appointment for pt in Jan 05-2021 with MD.

## 2020-09-04 NOTE — Telephone Encounter (Addendum)
    Patient Name: Cynthia Peters  DOB: 05/18/1950 MRN: [id number]  Primary Cardiologist: Peter Martinique, MD  Chart reviewed as part of pre-operative protocol coverage. My colleague Marrianne Mood, PA-C, spoke with patient on 08/23/2020 at which time she was doing well from a cardiac standpoint. She reported being able to complete at least 4.0 METS without any angina and Revised Cardiac Risk Index was 0.4% placing her at very low risk for any adverse cardiac events perioperatively. We were just waiting on coronary CTA results before giving final recommendations. Coronary CTA on 08/27/2020 showed only minimal (<25%) stenosis of the mid LAD. Therefore, given past medical history and time since last visit, based on ACC/AHA guidelines, Cynthia Peters would be at acceptable risk for the planned procedure without further cardiovascular testing.   Per Dr. Martinique, [place] to hold Aspirin prior to surgery. Please restart this as soon as able postoperatively.  I will route this recommendation to the requesting party via Epic fax function and remove from pre-op pool.  Please call with questions.  Darreld Mclean, PA-C 09/04/2020, 8:48 AM

## 2020-09-04 NOTE — Telephone Encounter (Signed)
She is okay to proceed with surgery. I just sent completed pre-op form back to Dr. Amedeo Plenty office. And yes ma'am, I think it is fine that her next visit is with Dr. Martinique in January as long as she is not having any new cardiac issues.   Thanks so much!

## 2020-09-13 ENCOUNTER — Telehealth: Payer: Self-pay | Admitting: Cardiology

## 2020-09-13 NOTE — Telephone Encounter (Signed)
New Message:      Patient wants to know if she needs an Antibiotic before she gets her teeth cleaned in 2 hours. She says she has an Aortic Aneurysm.

## 2020-09-13 NOTE — Telephone Encounter (Signed)
Called patient she wanted to know if she needed antibiotic before dental cleaning.Advised she does not need a antibiotic.Message sent to [place] for review.

## 2020-09-18 ENCOUNTER — Encounter (HOSPITAL_COMMUNITY)
Admission: RE | Admit: 2020-09-18 | Discharge: 2020-09-18 | Disposition: A | Discharge status: Home / Self Care | Payer: Medicare PPO | Source: Ambulatory Visit | Attending: Orthopedic Surgery | Admitting: Orthopedic Surgery

## 2020-09-18 ENCOUNTER — Other Ambulatory Visit: Payer: Self-pay

## 2020-09-18 ENCOUNTER — Encounter (HOSPITAL_COMMUNITY): Payer: Self-pay

## 2020-09-18 DIAGNOSIS — Z01812 Encounter for preprocedural laboratory examination: Secondary | ICD-10-CM | POA: Diagnosis not present

## 2020-09-18 DIAGNOSIS — Z20822 Contact with and (suspected) exposure to covid-19: Secondary | ICD-10-CM | POA: Diagnosis not present

## 2020-09-18 LAB — SARS CORONAVIRUS 2 (TAT 6-24 HRS): SARS Coronavirus 2: NEGATIVE

## 2020-09-18 NOTE — Progress Notes (Signed)
PCP - Bing Matter, PA-C Cardiologist - Peter Martinique  PPM/ICD - denies Device Orders -  Rep Notified -   Chest x-ray - 04/01/19 EKG - 08/24/20 Stress Test -  ECHO - 06/20/08 Cardiac Cath - none  Sleep Study - none CPAP -   Fasting Blood Sugar - n/a Checks Blood Sugar _____ times a day  Blood Thinner Instructions:n/a Aspirin Instructions:Pt instructed to call Dr.Gramig's office to find out when to stop Aspirin  ERAS Protcol -clear liquids until 1130 PRE-SURGERY Ensure or G2- no  COVID TEST- 09/18/20   Anesthesia review: no  Patient denies shortness of breath, fever, cough and chest pain at PAT appointment   All instructions explained to the patient, with a verbal understanding of the material. Patient agrees to go over the instructions while at home for a better understanding. Patient also instructed to self quarantine after being tested for COVID-19. The opportunity to ask questions was provided.

## 2020-09-18 NOTE — Progress Notes (Signed)
Surgical Instructions    Your procedure is scheduled on Friday August 19th.  Report to [facility] Main Entrance "A" at 12:30 P.M., then check in with the Admitting office.  Call this number if you have problems the morning of surgery:  [phone]   If you have any questions prior to your surgery date call [phone]: Open Monday-Friday 8am-4pm    Remember:  Do not eat after midnight the night before your surgery  You may drink clear liquids until 11:30 the morning of your surgery.   Clear liquids allowed are: Water, Non-Citrus Juices (without pulp), Carbonated Beverages, Clear Tea, Black Coffee Only, and Gatorade    Take these medicines the morning of surgery with A SIP OF WATER  amLODipine (NORVASC) 5 MG tablet buPROPion (WELLBUTRIN XL) 300 MG 24 hr tablet metoprolol succinate (TOPROL-XL) 100 MG 24 hr tablet montelukast (SINGULAIR) 10 MG tablet pantoprazole (PROTONIX) 20 MG tablet pravastatin (PRAVACHOL) 40 MG tablet valACYclovir (VALTREX) 500 MG tablet  IF NEEDED acetaminophen (TYLENOL) 500 MG tablet albuterol (VENTOLIN HFA) 108 (90 Base) MCG/ACT inhaler may bring with you to the hospital fluticasone (FLONASE) 50 MCG/ACT nasal spray meclizine (ANTIVERT) 25 MG tablet phenazopyridine (PYRIDIUM) 200 MG tablet   Follow your surgeon's instructions on when to stop Aspirin.  If no instructions were given by your surgeon then you will need to call the office to get those instructions.    As of today, STOP taking any Aspirin (unless otherwise instructed by your surgeon) Aleve, Naproxen, Ibuprofen, Motrin, Advil, Goody's, BC's, all herbal medications, fish oil, and all vitamins.          Do not wear jewelry or makeup Do not wear lotions, powders, perfumes, or deodorant. Do not shave 48 hours prior to surgery.   Do not bring valuables to the hospital. DO Not wear nail polish, gel polish, artificial nails, or any other type of covering on  natural nails including finger and  toenails. If patients have artificial nails, gel coating, etc. that need to be removed by a nail salon please have this removed prior to surgery or surgery may need to be canceled/delayed if the surgeon/ anesthesia feels like the patient is unable to be adequately monitored.             [facility] is not responsible for any belongings or valuables.  Do NOT Smoke (Tobacco/Vaping) or drink Alcohol 24 hours prior to your procedure If you use a CPAP at night, you may bring all equipment for your overnight stay.   Contacts, glasses, dentures or bridgework may not be worn into surgery, please bring cases for these belongings   For patients admitted to the hospital, discharge time will be determined by your treatment team.   Patients discharged the day of surgery will not be allowed to drive home, and someone needs to stay with them for 24 hours.  ONLY 1 SUPPORT PERSON MAY BE PRESENT WHILE YOU ARE IN SURGERY. IF YOU ARE TO BE ADMITTED ONCE YOU ARE IN YOUR ROOM YOU WILL BE ALLOWED TWO (2) VISITORS.  Minor children may have two parents present. Special consideration for safety and communication needs will be reviewed on a case by case basis.  Special instructions:    Oral Hygiene is also important to reduce your risk of infection.  Remember - BRUSH YOUR TEETH THE MORNING OF SURGERY WITH YOUR REGULAR TOOTHPASTE   [facility]- Preparing For Surgery  Before surgery, you can play an important role. Because skin is not sterile, your  skin needs to be as free of germs as possible. You can reduce the number of germs on your skin by washing with CHG (chlorahexidine gluconate) Soap before surgery.  CHG is an antiseptic cleaner which kills germs and bonds with the skin to continue killing germs even after washing.     Please do not use if you have an allergy to CHG or antibacterial soaps. If your skin becomes reddened/irritated stop using the CHG.  Do not shave (including legs and underarms) for at least 48  hours prior to first CHG shower. It is OK to shave your face.  Please follow these instructions carefully.     Shower the NIGHT BEFORE SURGERY and the MORNING OF SURGERY with CHG Soap.   If you chose to wash your hair, wash your hair first as usual with your normal shampoo. After you shampoo, rinse your hair and body thoroughly to remove the shampoo.  Then [employer] and genitals (private parts) with your normal soap and rinse thoroughly to remove soap.  After that Use CHG Soap as you would any other liquid soap. You can apply CHG directly to the skin and wash gently with a scrungie or a clean washcloth.   Apply the CHG Soap to your body ONLY FROM THE NECK DOWN.  Do not use on open wounds or open sores. Avoid contact with your eyes, ears, mouth and genitals (private parts). Wash Face and genitals (private parts)  with your normal soap.   Wash thoroughly, paying special attention to the area where your surgery will be performed.  Thoroughly rinse your body with warm water from the neck down.  DO NOT shower/wash with your normal soap after using and rinsing off the CHG Soap.  Pat yourself dry with a CLEAN TOWEL.  Wear CLEAN PAJAMAS to bed the night before surgery  Place CLEAN SHEETS on your bed the night before your surgery  DO NOT SLEEP WITH PETS.   Day of Surgery:  Take a shower with CHG soap. Wear Clean/Comfortable clothing the morning of surgery Do not apply any deodorants/lotions.   Remember to brush your teeth WITH YOUR REGULAR TOOTHPASTE.   Please read over the following fact sheets that you were given.

## 2020-09-21 ENCOUNTER — Ambulatory Visit (HOSPITAL_COMMUNITY)
Admission: RE | Admit: 2020-09-21 | Discharge: 2020-09-21 | Disposition: A | Discharge status: Home / Self Care | Payer: Medicare PPO | Attending: Orthopedic Surgery | Admitting: Orthopedic Surgery

## 2020-09-21 ENCOUNTER — Other Ambulatory Visit: Payer: Self-pay

## 2020-09-21 ENCOUNTER — Encounter (HOSPITAL_COMMUNITY): Payer: Self-pay | Admitting: Orthopedic Surgery

## 2020-09-21 ENCOUNTER — Ambulatory Visit (HOSPITAL_COMMUNITY): Payer: Medicare PPO | Admitting: Certified Registered"

## 2020-09-21 ENCOUNTER — Encounter (HOSPITAL_COMMUNITY)
Admission: RE | Disposition: A | Discharge status: Home / Self Care | Payer: Self-pay | Source: Home / Self Care | Attending: Orthopedic Surgery

## 2020-09-21 DIAGNOSIS — Z885 Allergy status to narcotic agent status: Secondary | ICD-10-CM | POA: Diagnosis not present

## 2020-09-21 DIAGNOSIS — M659 Synovitis and tenosynovitis, unspecified: Secondary | ICD-10-CM | POA: Diagnosis not present

## 2020-09-21 DIAGNOSIS — Z7982 Long term (current) use of aspirin: Secondary | ICD-10-CM | POA: Diagnosis not present

## 2020-09-21 DIAGNOSIS — Z87891 Personal history of nicotine dependence: Secondary | ICD-10-CM | POA: Insufficient documentation

## 2020-09-21 DIAGNOSIS — Z79899 Other long term (current) drug therapy: Secondary | ICD-10-CM | POA: Insufficient documentation

## 2020-09-21 DIAGNOSIS — T8489XA Other specified complication of internal orthopedic prosthetic devices, implants and grafts, initial encounter: Secondary | ICD-10-CM | POA: Diagnosis not present

## 2020-09-21 DIAGNOSIS — Z888 Allergy status to other drugs, medicaments and biological substances status: Secondary | ICD-10-CM | POA: Diagnosis not present

## 2020-09-21 DIAGNOSIS — Z806 Family history of leukemia: Secondary | ICD-10-CM | POA: Insufficient documentation

## 2020-09-21 DIAGNOSIS — Z820 Family history of epilepsy and other diseases of the nervous system: Secondary | ICD-10-CM | POA: Diagnosis not present

## 2020-09-21 DIAGNOSIS — Z86718 Personal history of other venous thrombosis and embolism: Secondary | ICD-10-CM | POA: Diagnosis not present

## 2020-09-21 DIAGNOSIS — Z791 Long term (current) use of non-steroidal anti-inflammatories (NSAID): Secondary | ICD-10-CM | POA: Diagnosis not present

## 2020-09-21 DIAGNOSIS — Z8673 Personal history of transient ischemic attack (TIA), and cerebral infarction without residual deficits: Secondary | ICD-10-CM | POA: Diagnosis not present

## 2020-09-21 DIAGNOSIS — T8482XA Fibrosis due to internal orthopedic prosthetic devices, implants and grafts, initial encounter: Secondary | ICD-10-CM | POA: Diagnosis not present

## 2020-09-21 DIAGNOSIS — Z8379 Family history of other diseases of the digestive system: Secondary | ICD-10-CM | POA: Insufficient documentation

## 2020-09-21 DIAGNOSIS — X58XXXA Exposure to other specified factors, initial encounter: Secondary | ICD-10-CM | POA: Diagnosis not present

## 2020-09-21 HISTORY — PX: ARTHROTOMY: SHX5728

## 2020-09-21 SURGERY — ARTHROTOMY
Anesthesia: General | Laterality: Right

## 2020-09-21 MED ORDER — FENTANYL CITRATE (PF) 100 MCG/2ML IJ SOLN
INTRAMUSCULAR | Status: AC
  Filled 2020-09-21: qty 2

## 2020-09-21 MED ORDER — LIDOCAINE 2% (20 MG/ML) 5 ML SYRINGE
INTRAMUSCULAR | Status: DC | PRN
  Administered 2020-09-21: 60 mg via INTRAVENOUS

## 2020-09-21 MED ORDER — LACTATED RINGERS IV SOLN: INTRAVENOUS | Status: DC

## 2020-09-21 MED ORDER — MIDAZOLAM HCL 5 MG/5ML IJ SOLN
INTRAMUSCULAR | Status: DC | PRN
Start: 2020-09-21 — End: 2020-09-21
  Administered 2020-09-21: 2 mg via INTRAVENOUS

## 2020-09-21 MED ORDER — AMISULPRIDE (ANTIEMETIC) 5 MG/2ML IV SOLN: 10.0000 mg | Freq: Once | INTRAVENOUS | Status: DC | PRN

## 2020-09-21 MED ORDER — CEFAZOLIN SODIUM-DEXTROSE 2-4 GM/100ML-% IV SOLN
2.0000 g | INTRAVENOUS | Status: AC
  Administered 2020-09-21: 2 g via INTRAVENOUS

## 2020-09-21 MED ORDER — MIDAZOLAM HCL 2 MG/2ML IJ SOLN
INTRAMUSCULAR | Status: AC
  Filled 2020-09-21: qty 2

## 2020-09-21 MED ORDER — ACETAMINOPHEN 500 MG PO TABS
1000.0000 mg | ORAL_TABLET | Freq: Once | ORAL | Status: AC
  Administered 2020-09-21: 1000 mg via ORAL
  Filled 2020-09-21: qty 2

## 2020-09-21 MED ORDER — ORAL CARE MOUTH RINSE: 15.0000 mL | Freq: Once | OROMUCOSAL | Status: AC

## 2020-09-21 MED ORDER — PROPOFOL 10 MG/ML IV BOLUS
INTRAVENOUS | Status: DC | PRN
  Administered 2020-09-21: 20 mg via INTRAVENOUS
  Administered 2020-09-21: 150 mg via INTRAVENOUS

## 2020-09-21 MED ORDER — SCOPOLAMINE 1 MG/3DAYS TD PT72
1.0000 | MEDICATED_PATCH | TRANSDERMAL | Status: DC
  Administered 2020-09-21: 1.5 mg via TRANSDERMAL
  Filled 2020-09-21: qty 1

## 2020-09-21 MED ORDER — FENTANYL CITRATE (PF) 250 MCG/5ML IJ SOLN
INTRAMUSCULAR | Status: DC | PRN
  Administered 2020-09-21: 100 ug via INTRAVENOUS
  Administered 2020-09-21: 50 ug via INTRAVENOUS

## 2020-09-21 MED ORDER — ONDANSETRON HCL 4 MG/2ML IJ SOLN
INTRAMUSCULAR | Status: DC | PRN
  Administered 2020-09-21: 4 mg via INTRAVENOUS

## 2020-09-21 MED ORDER — CHLORHEXIDINE GLUCONATE 0.12 % MT SOLN
OROMUCOSAL | Status: AC
  Administered 2020-09-21: 15 mL via OROMUCOSAL
  Filled 2020-09-21: qty 15

## 2020-09-21 MED ORDER — CEFAZOLIN SODIUM-DEXTROSE 2-4 GM/100ML-% IV SOLN
INTRAVENOUS | Status: AC
  Filled 2020-09-21: qty 100

## 2020-09-21 MED ORDER — SODIUM CHLORIDE 0.9 % IR SOLN
Status: DC | PRN
  Administered 2020-09-21: 3000 mL

## 2020-09-21 MED ORDER — EPHEDRINE SULFATE-NACL 50-0.9 MG/10ML-% IV SOSY
PREFILLED_SYRINGE | INTRAVENOUS | Status: DC | PRN
  Administered 2020-09-21: 10 mg via INTRAVENOUS
  Administered 2020-09-21: 5 mg via INTRAVENOUS
  Administered 2020-09-21: 10 mg via INTRAVENOUS
  Administered 2020-09-21: 5 mg via INTRAVENOUS
  Administered 2020-09-21: 20 mg via INTRAVENOUS

## 2020-09-21 MED ORDER — GLYCOPYRROLATE 0.2 MG/ML IJ SOLN
INTRAMUSCULAR | Status: DC | PRN
  Administered 2020-09-21: .2 mg via INTRAVENOUS

## 2020-09-21 MED ORDER — DEXAMETHASONE SODIUM PHOSPHATE 10 MG/ML IJ SOLN
INTRAMUSCULAR | Status: DC | PRN
  Administered 2020-09-21: 10 mg via INTRAVENOUS

## 2020-09-21 MED ORDER — FENTANYL CITRATE (PF) 100 MCG/2ML IJ SOLN
25.0000 ug | INTRAMUSCULAR | Status: DC | PRN
  Administered 2020-09-21: 25 ug via INTRAVENOUS

## 2020-09-21 MED ORDER — HYDROMORPHONE HCL 1 MG/ML IJ SOLN
INTRAMUSCULAR | Status: DC | PRN
  Administered 2020-09-21: .5 mg via INTRAVENOUS

## 2020-09-21 MED ORDER — BUPIVACAINE HCL (PF) 0.25 % IJ SOLN
INTRAMUSCULAR | Status: AC
  Filled 2020-09-21: qty 30

## 2020-09-21 MED ORDER — 0.9 % SODIUM CHLORIDE (POUR BTL) OPTIME
TOPICAL | Status: DC | PRN
  Administered 2020-09-21: 1000 mL

## 2020-09-21 MED ORDER — CHLORHEXIDINE GLUCONATE 0.12 % MT SOLN: 15.0000 mL | Freq: Once | OROMUCOSAL | Status: AC

## 2020-09-21 MED ORDER — PHENYLEPHRINE HCL-NACL 20-0.9 MG/250ML-% IV SOLN
INTRAVENOUS | Status: DC | PRN
  Administered 2020-09-21: 50 ug/min via INTRAVENOUS

## 2020-09-21 MED ORDER — PHENYLEPHRINE 40 MCG/ML (10ML) SYRINGE FOR IV PUSH (FOR BLOOD PRESSURE SUPPORT)
PREFILLED_SYRINGE | INTRAVENOUS | Status: DC | PRN
  Administered 2020-09-21 (×2): 120 ug via INTRAVENOUS

## 2020-09-21 MED ORDER — PROMETHAZINE HCL 25 MG/ML IJ SOLN: 6.2500 mg | INTRAMUSCULAR | Status: DC | PRN

## 2020-09-21 SURGICAL SUPPLY — 47 items
ANCHOR QUATTRO LINK KNTLS 2.9 (Anchor) ×1 IMPLANT
BAG COUNTER SPONGE SURGICOUNT (BAG) ×2 IMPLANT
BIT DRILL HRD BONE QL 2.9 ANCH (DRILL) IMPLANT
BLADE LONG MED 31X9 (MISCELLANEOUS) ×2 IMPLANT
BNDG COHESIVE 4X5 TAN STRL (GAUZE/BANDAGES/DRESSINGS) ×2 IMPLANT
BNDG ELASTIC 3X5.8 VLCR STR LF (GAUZE/BANDAGES/DRESSINGS) ×2 IMPLANT
BNDG ELASTIC 4X5.8 VLCR STR LF (GAUZE/BANDAGES/DRESSINGS) ×2 IMPLANT
BNDG ESMARK 4X9 LF (GAUZE/BANDAGES/DRESSINGS) ×2 IMPLANT
BNDG GAUZE ELAST 4 BULKY (GAUZE/BANDAGES/DRESSINGS) ×6 IMPLANT
BroadBand Tape ×1 IMPLANT
CORD BIPOLAR FORCEPS 12FT (ELECTRODE) ×2 IMPLANT
COVER SURGICAL LIGHT HANDLE (MISCELLANEOUS) ×2 IMPLANT
CUFF TOURN SGL QUICK 18X4 (TOURNIQUET CUFF) ×2 IMPLANT
DRAPE INCISE IOBAN 66X45 STRL (DRAPES) ×2 IMPLANT
DRAPE OEC MINIVIEW 54X84 (DRAPES) ×1 IMPLANT
DRILL HARD BONE QL 2.9 ANCH (DRILL) ×2
DRSG ADAPTIC 3X8 NADH LF (GAUZE/BANDAGES/DRESSINGS) ×1 IMPLANT
DRSG PAD ABDOMINAL 8X10 ST (GAUZE/BANDAGES/DRESSINGS) ×1 IMPLANT
GAUZE SPONGE 4X4 12PLY STRL (GAUZE/BANDAGES/DRESSINGS) ×2 IMPLANT
GAUZE XEROFORM 1X8 LF (GAUZE/BANDAGES/DRESSINGS) ×2 IMPLANT
GAUZE XEROFORM 5X9 LF (GAUZE/BANDAGES/DRESSINGS) ×1 IMPLANT
GLOVE SURG ENC TEXT LTX SZ8 (GLOVE) ×2 IMPLANT
GLOVE SURG MICRO LTX SZ8 (GLOVE) ×2 IMPLANT
GOWN STRL REUS W/ TWL LRG LVL3 (GOWN DISPOSABLE) ×2 IMPLANT
GOWN STRL REUS W/ TWL XL LVL3 (GOWN DISPOSABLE) ×3 IMPLANT
GOWN STRL REUS W/TWL LRG LVL3 (GOWN DISPOSABLE) ×4
GOWN STRL REUS W/TWL XL LVL3 (GOWN DISPOSABLE) ×6
IV NS IRRIG 3000ML ARTHROMATIC (IV SOLUTION) ×1 IMPLANT
KIT BASIN OR (CUSTOM PROCEDURE TRAY) ×2 IMPLANT
KIT TURNOVER KIT B (KITS) ×2 IMPLANT
MANIFOLD NEPTUNE II (INSTRUMENTS) ×2 IMPLANT
NDL HYPO 25GX1X1/2 BEV (NEEDLE) IMPLANT
NEEDLE HYPO 25GX1X1/2 BEV (NEEDLE) ×2 IMPLANT
NS IRRIG 1000ML POUR BTL (IV SOLUTION) ×2 IMPLANT
PACK ORTHO EXTREMITY (CUSTOM PROCEDURE TRAY) ×2 IMPLANT
PAD ARMBOARD 7.5X6 YLW CONV (MISCELLANEOUS) ×4 IMPLANT
PAD CAST 4YDX4 CTTN HI CHSV (CAST SUPPLIES) ×2 IMPLANT
PADDING CAST COTTON 4X4 STRL (CAST SUPPLIES) ×8
SET CYSTO W/LG BORE CLAMP LF (SET/KITS/TRAYS/PACK) ×1 IMPLANT
SLING ARM FOAM STRAP LRG (SOFTGOODS) ×1 IMPLANT
SOL PREP POV-IOD 4OZ 10% (MISCELLANEOUS) ×4 IMPLANT
SUT BROADBAND TAPE 2PK 2.3 (SUTURE) ×1 IMPLANT
SUT MAXBRAID (SUTURE) ×1 IMPLANT
SUT PROLENE 3 0 PS 2 (SUTURE) ×2 IMPLANT
TOWEL GREEN STERILE (TOWEL DISPOSABLE) ×4 IMPLANT
TOWEL GREEN STERILE FF (TOWEL DISPOSABLE) ×2 IMPLANT
UNDERPAD 30X36 HEAVY ABSORB (UNDERPADS AND DIAPERS) ×2 IMPLANT

## 2020-09-21 NOTE — Transfer of Care (Signed)
Immediate Anesthesia Transfer of Care Note  Patient: Cynthia Peters  Procedure(s) Performed: Right elbow open arthrotomy and synovectomy with radial head implant removal and repair reconstruction as necessary including ligamentous repair (Right)  Patient Location: PACU  Anesthesia Type:General  Level of Consciousness: drowsy and patient cooperative  Airway & Oxygen Therapy: Patient Spontanous Breathing  Post-op Assessment: Report given to RN and Post -op Vital signs reviewed and stable  Post vital signs: Reviewed and stable  Last Vitals:  Vitals Value Taken Time  BP 126/59 09/21/20 1720  Temp 36.4 C 09/21/20 1720  Pulse 66 09/21/20 1722  Resp 10 09/21/20 1722  SpO2 96 % 09/21/20 1722  Vitals shown include unvalidated device data.  Last Pain:  Vitals:   09/21/20 1430  PainSc: 0-No pain      Patients Stated Pain Goal: 2 ([id number])  Complications: No notable events documented.

## 2020-09-21 NOTE — Anesthesia Postprocedure Evaluation (Signed)
Anesthesia Post Note  Patient: Tarisa A Yinger  Procedure(s) Performed: Right elbow open arthrotomy and synovectomy with radial head implant removal and repair reconstruction as necessary including ligamentous repair (Right)     Patient location during evaluation: PACU Anesthesia Type: General Level of consciousness: awake and alert, patient cooperative and oriented Pain management: pain level controlled Vital Signs Assessment: post-procedure vital signs reviewed and stable Respiratory status: spontaneous breathing, nonlabored ventilation and respiratory function stable Cardiovascular status: blood pressure returned to baseline and stable Postop Assessment: no apparent nausea or vomiting and able to ambulate Anesthetic complications: no   No notable events documented.  Last Vitals:  Vitals:   09/21/20 1805 09/21/20 1820  BP: (!) 112/55 128/71  Pulse: (!) 58 69  Resp: 14 17  Temp:    SpO2: 98% 100%    Last Pain:  Vitals:   09/21/20 1820  PainSc: 1                  Oriah Leinweber,E. Joy Reiger

## 2020-09-21 NOTE — H&P (Signed)
Cynthia Peters is an 70 y.o. female.   Chief Complaint: Patient presents for arthrotomy synovectomy right elbow with hardware removal and repair reconstruction as necessary HPI: Patient presents for evaluation and treatment of the of their upper extremity predicament. The patient denies neck, back, chest or  abdominal pain. The patient notes that they have no lower extremity problems. The patients primary complaint is noted. We are planning surgical care pathway for the upper extremity.   Past Medical History:  Diagnosis Date   Anemia    history in the past, not on a regular basis   Arthritis    Bronchitis    Cataracts, bilateral    immature   Claustrophobia    takes Valium daily as needed   Complication of anesthesia    2013 SURGERY vocal cord bothering pt, used smaller ent tube after 2 hernia repair, no problem after . " I woke up during my MRI and colonoscopy."        Depression    takes Wellbutrin daily   Dry eye    Dry mouth    GERD (gastroesophageal reflux disease)    takes Omeprazole daily    H/O hiatal hernia    Headache    migraines with flashing lights    History of blood clots 40 yrs    leg    History of echocardiogram 2013   a. Echo (05/2011):  Mild LVH, EF 65-70%, no WMA, Gr 1 DD, mild AI.   History of MRSA infection    History of staph infection    History of stress test 2010   a. ETT-Echo in 2010 was normal   History of TIA (transient ischemic attack) 2013   a. Carotid US (05/2011):  No ICA stenosis, pt unsure of this   Hyperlipidemia    takes Simvastatin daily    Hypertension    takes  Losartan daily   IBS (irritable bowel syndrome)    takes Bentyl daily as needed   Insomnia    takes Ambien nightly   Interstitial cystitis    Lichen planus    Mild asthma    Albuterol inhaler as needed   Palpitations    Event monitor in 2007 with rare PVCs // takes Metoprolol daily   PVC (premature ventricular contraction)    Seasonal allergies    takes Claritin  daily as needed.Takes Singulair daily as needed.   TIA (transient ischemic attack) 05/27/2011   Urethral stricture    Vertigo    takes Meclizine daily as needed    Past Surgical History:  Procedure Laterality Date   CESAREAN SECTION     COLONOSCOPY WITH PROPOFOL N/A 08/10/2012   Procedure: COLONOSCOPY WITH PROPOFOL;  Surgeon: Garlan Fair, MD;  Location: [facility];  Service: Endoscopy;  Laterality: N/A;   CYSTOSCOPY WITH URETHRAL DILATATION N/A 02/18/2013   Procedure: CYSTOSCOPY WITH URETHRAL DILATATION ( NO BALLOON) AND HYDRODISTENSION;  Surgeon: Alexis Frock, MD;  Location: [facility];  Service: Urology;  Laterality: N/A;   ENDOMETRIAL ABLATION W/ HYDROTHERMABLATOR  09-27-2002   INCISIONAL HERNIA REPAIR N/A 10/25/2014   Procedure: HERNIA REPAIR INCISIONAL;  Surgeon: Ralene Ok, MD;  Location: [facility];  Service: General;  Laterality: N/A;   [place] N/A 03/26/2015   Procedure: LAPAROSCOPIC INCISIONAL HERNIA;  Surgeon: Ralene Ok, MD;  Location: [facility];  Service: General;  Laterality: N/A;   INSERTION OF MESH N/A 10/25/2014   Procedure: INSERTION OF MESH;  Surgeon: Ralene Ok, MD;  Location: [place];  Service: General;  Laterality: N/A;   LAPAROSCOPIC LYSIS OF ADHESIONS N/A 03/26/2015   Procedure: LAPAROSCOPIC LYSIS OF ADHESIONS;  Surgeon: Ralene Ok, MD;  Location: [place];  Service: General;  Laterality: N/A;   LAPAROTOMY N/A 10/25/2014   Procedure: EXPLORATORY LAPAROTOMY;  Surgeon: Ralene Ok, MD;  Location: [facility];  Service: General;  Laterality: N/A;   LYSIS OF ADHESION N/A 10/25/2014   Procedure: LYSIS OF ADHESIONS ;  Surgeon: Ralene Ok, MD;  Location: [facility];  Service: General;  Laterality: N/A;   MUCOSAL ADVANCEMENT FLAP N/A 10/25/2014   Procedure: MUCOSAL ADVANCEMENT FLAP;  Surgeon: Ralene Ok, MD;  Location: [facility];  Service: General;  Laterality: N/A;   ORIF RADIAL FRACTURE Right 06/17/2016   Procedure: right radial head  arthroplasty with ligament repair, reconstruciton;  Surgeon: Roseanne Kaufman, MD;  Location: [facility];  Service: Orthopedics;  Laterality: Right;   RADIAL HEAD ARTHROPLASTY Right 06/17/2016   TOOTH EXTRACTION     TRANSTHORACIC ECHOCARDIOGRAM  05-26-2011   MILD LVH/  EF 123456  GRADE I DIASTOLIC DYSFUNCTION/  MILD AR//   06-20-2008  NOMRAL STRESS ECHO   UMBILICAL HERNIA REPAIR  2013   AND REVISION THE SAME YEAR W/ MESH   WISDOM TOOTH EXTRACTION  AGE 30    Family History  Problem Relation Age of Onset   Dementia Mother    Leukemia Child    Cirrhosis Father    Social History:  reports that she quit smoking about 44 years ago. Her smoking use included cigarettes. She has a 0.50 pack-year smoking history. She has never used smokeless tobacco. She reports current alcohol use. She reports that she does not use drugs.  Allergies:  Allergies  Allergen Reactions   Hydrocodone Rash   Hydrocodone-Acetaminophen Rash   Tramadol Palpitations    Medications Prior to Admission  Medication Sig Dispense Refill   acetaminophen (TYLENOL) 500 MG tablet Take 1,000 mg by mouth every 6 (six) hours as needed (pain).      amLODipine (NORVASC) 5 MG tablet Take 5 mg by mouth daily.     aspirin 81 MG chewable tablet Chew 81 mg by mouth daily.     buPROPion (WELLBUTRIN XL) 300 MG 24 hr tablet Take 300 mg by mouth daily.     Cholecalciferol (VITAMIN D) 2000 units CAPS Take 2,000 Units by mouth daily.     Coenzyme Q10 (CO Q 10) 100 MG CAPS Take 100 mg by mouth daily.      [employer] VITAMIN C 1000 MG tablet Take 2,000 mg by mouth daily.  1   Evening Primrose Oil 1000 MG CAPS Take 1,000 mg by mouth daily.     fexofenadine (ALLEGRA) 180 MG tablet Take 180 mg by mouth daily.     Flaxseed, Linseed, 1000 MG CAPS Take 1,000 mg by mouth daily.     hydrochlorothiazide 25 MG tablet Take 25 mg by mouth daily.     hydrocortisone cream 1 % Apply 1 application topically 4 (four) times daily as needed for itching.     ibuprofen  (ADVIL,MOTRIN) 200 MG tablet Take 400-600 mg by mouth 2 (two) times daily as needed (pain).      losartan (COZAAR) 100 MG tablet TAKE 1 TABLET BY MOUTH EVERY DAY (Patient taking differently: Take 100 mg by mouth daily.) 90 tablet 3   magnesium oxide (MAG-OX) 400 MG tablet Take 400 mg by mouth daily.     meclizine (ANTIVERT) 25 MG tablet Take 1 tablet (25 mg total) by mouth 3 (  three) times daily as needed for dizziness. 30 tablet 0   metoprolol succinate (TOPROL-XL) 100 MG 24 hr tablet Take 1 tablet (100 mg total) by mouth 2 (two) times daily. Take with or immediately following a meal. Take 100 mg by mouth 2 (two) times daily. Take with or immediately following a meal. 30 tablet 1   montelukast (SINGULAIR) 10 MG tablet Take 10 mg by mouth daily.     Multiple Vitamins-Minerals (MULTIPLE VITAMINS/WOMENS PO) Take 1 tablet by mouth daily.     mupirocin ointment (BACTROBAN) 2 % Apply 1 application topically 3 (three) times daily as needed for rash.     pantoprazole (PROTONIX) 20 MG tablet Take 20 mg by mouth daily.     phenazopyridine (PYRIDIUM) 200 MG tablet Take 200 mg by mouth 3 (three) times daily as needed for pain (interstitial cystitis).     Polyethyl Glyc-Propyl Glyc PF (SYSTANE ULTRA PF) 0.4-0.3 % SOLN Place 1 drop into both eyes daily as needed (Dry eye).     polyethylene glycol (MIRALAX / GLYCOLAX) 17 g packet Take 17 g by mouth 2 (two) times daily. (Patient taking differently: Take 17 g by mouth daily.) 14 each 0   potassium chloride SA (KLOR-CON M20) 20 MEQ tablet TAKE 1&1/2 TABLETS BY MOUTH DAILY. (Patient taking differently: Take 30 mEq by mouth daily.) 135 tablet 3   pravastatin (PRAVACHOL) 40 MG tablet Take 40 mg by mouth daily.     Probiotic Product (ALIGN PO) Take 1 capsule by mouth 2 (two) times daily.     valACYclovir (VALTREX) 500 MG tablet INCREASE TO TAKING 1 TABLET 2 TIMES A DAY FOR 3 DAYS WITH OUTBREAK (Patient taking differently: Take 500 mg by mouth daily.) 30 tablet 0    albuterol (VENTOLIN HFA) 108 (90 Base) MCG/ACT inhaler Inhale 2 puffs into the lungs every 6 (six) hours as needed for wheezing or shortness of breath.     clobetasol cream (TEMOVATE) [id number] % Apply 1 application topically 2 (two) times daily as needed (rash).   7   fluticasone (FLONASE) 50 MCG/ACT nasal spray Place 2 sprays into both nostrils daily as needed for allergies or rhinitis.      No results found for this or any previous visit (from the past 48 hour(s)). No results found.  Review of Systems  Cardiovascular: Negative.   Gastrointestinal: Negative.   Genitourinary: Negative.    Blood pressure (!) 160/69, pulse 72, temperature 97.9 F (36.6 C), resp. rate 18, height '5\' 4"'$  (1.626 m), weight 72.6 kg, last menstrual period 02/04/2004, SpO2 98 %. Physical Exam  Right elbow synovitis with loose/osteolysis around the radial head implant placed in the distant past.  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  At present time given her recent pain and difficulties as well as the timeframe duration from initial surgery I would recommend removal of the hardware and evaluation of the joint with arthrotomy synovectomy and repair is necessary.  Patient understands the risk and desires to proceed.  The patient is alert and oriented in no acute distress. The patient complains of pain in the affected upper extremity.  The patient is noted to have a normal HEENT exam. Lung fields show equal chest expansion and no shortness of breath. Abdomen exam is nontender without distention. Lower extremity examination does not show any fracture dislocation or blood clot symptoms. Pelvis is stable and the neck and back are stable and nontender.  Assessment/Plan We are planning surgery for your  upper extremity. The risk and benefits of surgery to include risk of bleeding, infection, anesthesia,  damage to normal structures and failure of the surgery to accomplish  its intended goals of relieving symptoms and restoring function have been discussed in detail. With this in mind we plan to proceed. I have specifically discussed with the patient the pre-and postoperative regime and the dos and don'ts and risk and benefits in great detail. Risk and benefits of surgery also include risk of dystrophy(CRPS), chronic nerve pain, failure of the healing process to go onto completion and other inherent risks of surgery The relavent the pathophysiology of the disease/injury process, as well as the alternatives for treatment and postoperative course of action has been discussed in great detail with the patient who desires to proceed.  We will do everything in our power to help you (the patient) restore function to the upper extremity. It is a pleasure to see this patient today.   We will plan for surgical avenues of care to include arthrotomy synovectomy capsulotomy capsulectomy and hardware removal with repair is necessary ligamentous architecture if needed.  She understands risk and benefits and desires to proceed.  Willa Frater III, MD 09/21/2020, 2:40 PM

## 2020-09-21 NOTE — Discharge Instructions (Signed)
Please keep your arm still Please ice and elevate Please move your fingers frequently Dr Carmelia Bake cell phone [phone] for emergencies   Keep bandage clean and dry.  Call for any problems.  No smoking.  Criteria for driving a car: you should be off your pain medicine for 7-8 hours, able to drive one handed(confident), thinking clearly and feeling able in your judgement to drive. Continue elevation as it will decrease swelling.  If instructed by MD move your fingers within the confines of the bandage/splint.  Use ice if instructed by your MD. Call immediately for any sudden loss of feeling in your hand/arm or change in functional abilities of the extremity. We recommend that you to take vitamin C 1000 mg a day to promote healing. We also recommend that if you require  pain medicine that you take a stool softener to prevent constipation as most pain medicines will have constipation side effects. We recommend either Peri-Colace or Senokot and recommend that you also consider adding MiraLAX as well to prevent the constipation affects from pain medicine if you are required to use them. These medicines are over the counter and may be purchased at a local pharmacy. A cup of yogurt and a probiotic can also be helpful during the recovery process as the medicines can disrupt your intestinal environment.

## 2020-09-21 NOTE — Anesthesia Preprocedure Evaluation (Signed)
Anesthesia Evaluation  Patient identified by MRN, date of birth, ID band Patient awake    Reviewed: Allergy & Precautions, NPO status , Patient's Chart, lab work & pertinent test results  Airway Mallampati: II  TM Distance: >3 FB Neck ROM: Full    Dental no notable dental hx.    Pulmonary asthma ,  COPD inhaler, former smoker,    Pulmonary exam normal breath sounds clear to auscultation       Cardiovascular hypertension, Pt. on medications Normal cardiovascular exam Rhythm:Regular Rate:Normal - Diastolic murmurs    Neuro/Psych  Headaches, PSYCHIATRIC DISORDERS Anxiety Depression Severe claustrophobiaTIA Neuromuscular disease    GI/Hepatic Neg liver ROS, GERD  ,  Endo/Other  negative endocrine ROS  Renal/GU   negative genitourinary   Musculoskeletal  (+) Arthritis ,   Abdominal   Peds negative pediatric ROS (+)  Hematology negative hematology ROS (+) anemia ,   Anesthesia Other Findings   Reproductive/Obstetrics negative OB ROS                             Anesthesia Physical Anesthesia Plan  ASA: 2  Anesthesia Plan: General   Post-op Pain Management:    Induction:   PONV Risk Score and Plan:   Airway Management Planned: LMA  Additional Equipment: None  Intra-op Plan:   Post-operative Plan: Extubation in OR  Informed Consent: I have reviewed the patients History and Physical, chart, labs and discussed the procedure including the risks, benefits and alternatives for the proposed anesthesia with the patient or authorized representative who has indicated his/her understanding and acceptance.     Dental advisory given  Plan Discussed with: CRNA, Anesthesiologist and Surgeon  Anesthesia Plan Comments: (Patient extremely clautrophobic and does not want a peripheral nerve block. She had one in the past and did not tolerate it well. She also states she as a lot of difficulty  tolerating casts. Discussed with Gramig and he will plan to inject local anesthetic prior to closing. Will plan for GA/LMA. Norton Blizzard, MD  )        Anesthesia Quick Evaluation

## 2020-09-21 NOTE — Op Note (Signed)
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  Patient presents for exploration synovectomy and repair with implant removal  Postop diagnosis: The same  Operative procedure #1 evaluation under anesthesia right elbow.  #2 removal radial head implant from [employer].  The implant was stable and the stem was fixed/ingrowth into the bone noted however there was collar resorption.  The implant was not loose or dismantled however.  #3 extensive arthrotomy synovectomy right elbow #4 radical capsulotomy capsulectomy anterior elbow with removal of metallosis reaction extensive in nature #5 lateral ulnar collateral ligament reconstruction with [employer] 2.9 anchor laterally and 2 rows of suture tape.  #6 stress radiography right elbow  Surgeon Roseanne Kaufman  Anesthesia General  Tourniquet time less than 90 minutes  Estimated blood loss minimal  Complications none  Specimen sent for pathology and specimen sent for aerobic anaerobic and fungal as well as atypical mycobacterial cultures.  I should note there is no gross infectious findings however.  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  There was a large mass of synovitic change in the anterior chamber of the elbow.  The patient had an intact radial head implant but this was removed due to the metallosis findings and the advanced synovitis.  The metallosis had made the lateral ulnar collateral ligament somewhat lax and thus a formal repair with suture tape was accomplished as noted below.  There was erosion about the capitellum and advanced degenerative changes about the elbow.  The  ulnohumeral joint was stable at the conclusion.  Procedure in detail: Patient was seen by myself and anesthesia.  She was taken to the operative theater.  She was counseled.  In the operative theater she underwent a general anesthetic followed by placement of SCDs about the lower extremities.  I prepped the arm with Hibiclens scrub x2 followed by 10-minute surgical Betadine scrub.  Sterile field was secured and drapes applied.  Final timeout was observed and the operation commenced with tourniquet insufflation and an incision about the lateral aspect of the elbow.  Dissection was carried down and a plane was made between the subcutaneous tissue and fascia.  I then entered the interval about the anterior portion of the elbow.  I dissected off the lateral epicondyle region proximally and identified the capsule I placed a [employer] in the slot between the capsule and the muscular tissue so as to very carefully provide a plane and isolate the capsule.  I kept the radial nerve pathway in mind at all times but did not formally dissect this.  The patient tolerated this well.  I then entered the joint and performed a radical capsulotomy capsulectomy.  I went from lateral all the way to medial and removed a big mass of synovitis and devitalized/metallosis type tissue.  This was an extensive capsulotomy capsulectomy radical nature with synovectomy.  The patient had erosion about the capitellum.  This time I remove the screw from the [employer] implant.  Following screw removal we then performed removal of the head followed by removal of the stem.  The stem was well fixed distally I placed the removal/stent placement guide and then with mallet tap this out.  Fortunately there is no iatrogenic injury to  the bone.  I then performed additional synovectomy.  I made sure the area was very clean.  I sent this for aerobic anaerobic atypical mycobacterial and fungal cultures.  I did send the synovitis for pathology as  well.  Following this we irrigated with 3 L of fluid.  I did very carefully identify and evaluate the lateral ulnar collateral ligament which was lax in its nature and thus I very carefully and cautiously placed 2 rows of suture tape from [employer] followed by placement of a 2.9 anchor about the lateral aspect at a point of isometry.  I checked this under radiograph making sure the radius did not have any instability.  It did not look well.  With the suture tapes haloed the patient had excellent stability about the ulna.  We then advanced the suture tapes with a 2.9 anchor in the point of isometry.  I then tested this once again.  The ulnohumeral joint was competent there is no subluxation tendencies and all with well.  Thus the patient had synovectomy arthrotomy capsulectomy capsulotomy removal of the radial head and neck implant as well as the stem and there is no metal left.  Lateral collateral ligament was reconstructed and there were no complicating features.  I did place 4 L of saline in the wound prior to ligament repair.  Following this I then repaired the interval of the extensor fascia with max braid suture and closed the skin edge with Prolene.  Tourniquet time was less than 90 minutes hemostasis was secured prior to closure and there were no excessive bleeding issues.  The patient tolerated this well.  The patient has extreme history of claustrophobia which makes it challenging.  I placed her in a Jones dressing with Luciana Axe bridge followed by another wrap and a shell that she can take on and off but I would like to keep her elbow at 90 degrees.  I asked her to not extend the last 45 degrees to for 6 weeks while the lateral ulnar collateral ligament is healing and work on gentle flexion.  We will see her back in 2 weeks and implement this process.  We will check in the recovery room as to whether she is discharged home tonight or we keep her based upon pain tolerance is etc.  All questions have been  encouraged and answered.  It was a pleasure to see her today and I discussed with the patient's family all issues.  Roseanne Kaufman MD

## 2020-09-21 NOTE — Anesthesia Procedure Notes (Signed)
Procedure Name: LMA Insertion Date/Time: 09/21/2020 3:55 PM Performed by: Lance Coon, CRNA Pre-anesthesia Checklist: Patient identified, Emergency Drugs available, Suction available, Patient being monitored and Timeout performed Patient Re-evaluated:Patient Re-evaluated prior to induction Oxygen Delivery Method: Circle system utilized Preoxygenation: Pre-oxygenation with 100% oxygen Induction Type: IV induction LMA: LMA inserted LMA Size: 3.0 Number of attempts: 1 Placement Confirmation: positive ETCO2 and breath sounds checked- equal and bilateral Tube secured with: Tape Dental Injury: Teeth and Oropharynx as per pre-operative assessment

## 2020-09-25 ENCOUNTER — Encounter (HOSPITAL_COMMUNITY): Payer: Self-pay | Admitting: Orthopedic Surgery

## 2020-09-25 LAB — ACID FAST SMEAR (AFB, MYCOBACTERIA): Acid Fast Smear: NEGATIVE

## 2020-09-25 LAB — SURGICAL PATHOLOGY

## 2020-09-27 LAB — AEROBIC/ANAEROBIC CULTURE W GRAM STAIN (SURGICAL/DEEP WOUND)
Culture: NO GROWTH
Gram Stain: NONE SEEN

## 2020-10-25 LAB — FUNGUS CULTURE RESULT

## 2020-10-25 LAB — FUNGAL ORGANISM REFLEX

## 2020-10-25 LAB — FUNGUS CULTURE WITH STAIN

## 2020-11-07 LAB — ACID FAST CULTURE WITH REFLEXED SENSITIVITIES (MYCOBACTERIA): Acid Fast Culture: NEGATIVE

## 2020-11-21 ENCOUNTER — Other Ambulatory Visit: Payer: Medicare PPO

## 2021-02-06 ENCOUNTER — Ambulatory Visit: Payer: Medicare PPO | Admitting: Cardiology

## 2021-02-08 ENCOUNTER — Observation Stay (HOSPITAL_BASED_OUTPATIENT_CLINIC_OR_DEPARTMENT_OTHER)
Admission: EM | Admit: 2021-02-08 | Discharge: 2021-02-10 | Disposition: A | Discharge status: Home / Self Care | Payer: Medicare PPO | Attending: Family Medicine | Admitting: Family Medicine

## 2021-02-08 ENCOUNTER — Encounter (HOSPITAL_BASED_OUTPATIENT_CLINIC_OR_DEPARTMENT_OTHER): Payer: Self-pay | Admitting: *Deleted

## 2021-02-08 ENCOUNTER — Other Ambulatory Visit: Payer: Self-pay

## 2021-02-08 ENCOUNTER — Emergency Department (HOSPITAL_BASED_OUTPATIENT_CLINIC_OR_DEPARTMENT_OTHER): Payer: Medicare PPO

## 2021-02-08 DIAGNOSIS — K56600 Partial intestinal obstruction, unspecified as to cause: Secondary | ICD-10-CM

## 2021-02-08 DIAGNOSIS — Z7982 Long term (current) use of aspirin: Secondary | ICD-10-CM | POA: Diagnosis not present

## 2021-02-08 DIAGNOSIS — I10 Essential (primary) hypertension: Secondary | ICD-10-CM | POA: Insufficient documentation

## 2021-02-08 DIAGNOSIS — Z20822 Contact with and (suspected) exposure to covid-19: Secondary | ICD-10-CM | POA: Diagnosis not present

## 2021-02-08 DIAGNOSIS — R8271 Bacteriuria: Secondary | ICD-10-CM | POA: Diagnosis present

## 2021-02-08 DIAGNOSIS — Z79899 Other long term (current) drug therapy: Secondary | ICD-10-CM | POA: Insufficient documentation

## 2021-02-08 DIAGNOSIS — K56609 Unspecified intestinal obstruction, unspecified as to partial versus complete obstruction: Secondary | ICD-10-CM | POA: Diagnosis not present

## 2021-02-08 DIAGNOSIS — Z8673 Personal history of transient ischemic attack (TIA), and cerebral infarction without residual deficits: Secondary | ICD-10-CM | POA: Insufficient documentation

## 2021-02-08 DIAGNOSIS — F419 Anxiety disorder, unspecified: Secondary | ICD-10-CM | POA: Diagnosis present

## 2021-02-08 DIAGNOSIS — J45909 Unspecified asthma, uncomplicated: Secondary | ICD-10-CM | POA: Insufficient documentation

## 2021-02-08 DIAGNOSIS — R109 Unspecified abdominal pain: Secondary | ICD-10-CM | POA: Diagnosis present

## 2021-02-08 DIAGNOSIS — Z87891 Personal history of nicotine dependence: Secondary | ICD-10-CM | POA: Diagnosis not present

## 2021-02-08 DIAGNOSIS — K219 Gastro-esophageal reflux disease without esophagitis: Secondary | ICD-10-CM | POA: Diagnosis present

## 2021-02-08 LAB — URINALYSIS, ROUTINE W REFLEX MICROSCOPIC
Bilirubin Urine: NEGATIVE
Glucose, UA: NEGATIVE mg/dL
Hgb urine dipstick: NEGATIVE
Ketones, ur: NEGATIVE mg/dL
Nitrite: NEGATIVE
Protein, ur: NEGATIVE mg/dL
Specific Gravity, Urine: >=1.03 (ref 1.005–1.030)
pH: 6 (ref 5.0–8.0)

## 2021-02-08 LAB — COMPREHENSIVE METABOLIC PANEL
ALT: 31 U/L (ref 0–44)
AST: 35 U/L (ref 15–41)
Albumin: 4.1 g/dL (ref 3.5–5.0)
Alkaline Phosphatase: 66 U/L (ref 38–126)
Anion gap: 11 (ref 5–15)
BUN: 24 mg/dL — ABNORMAL HIGH (ref 8–23)
CO2: 25 mmol/L (ref 22–32)
Calcium: 9.4 mg/dL (ref 8.9–10.3)
Chloride: 102 mmol/L (ref 98–111)
Creatinine, Ser: 0.76 mg/dL (ref 0.44–1.00)
GFR, Estimated: >60 mL/min (ref >60)
Glucose, Bld: 130 mg/dL — ABNORMAL HIGH (ref 70–99)
Potassium: 4 mmol/L (ref 3.5–5.1)
Sodium: 138 mmol/L (ref 135–145)
Total Bilirubin: 1.3 mg/dL — ABNORMAL HIGH (ref 0.3–1.2)
Total Protein: 6.6 g/dL (ref 6.5–8.1)

## 2021-02-08 LAB — CBC WITH DIFFERENTIAL/PLATELET
Abs Immature Granulocytes: 0.04 10*3/uL (ref 0.00–0.07)
Basophils Absolute: 0.1 10*3/uL (ref 0.0–0.1)
Basophils Relative: 1 %
Eosinophils Absolute: 0.3 10*3/uL (ref 0.0–0.5)
Eosinophils Relative: 2 %
HCT: 41.6 % (ref 36.0–46.0)
Hemoglobin: 14.5 g/dL (ref 12.0–15.0)
Immature Granulocytes: 0 %
Lymphocytes Relative: 6 %
Lymphs Abs: 0.9 10*3/uL (ref 0.7–4.0)
MCH: 32.6 pg (ref 26.0–34.0)
MCHC: 34.9 g/dL (ref 30.0–36.0)
MCV: 93.5 fL (ref 80.0–100.0)
Monocytes Absolute: 1 10*3/uL (ref 0.1–1.0)
Monocytes Relative: 8 %
Neutro Abs: 11 10*3/uL — ABNORMAL HIGH (ref 1.7–7.7)
Neutrophils Relative %: 83 %
Platelets: 192 10*3/uL (ref 150–400)
RBC: 4.45 MIL/uL (ref 3.87–5.11)
RDW: 12.1 % (ref 11.5–15.5)
WBC: 13.3 10*3/uL — ABNORMAL HIGH (ref 4.0–10.5)
nRBC: 0 % (ref 0.0–0.2)

## 2021-02-08 LAB — URINALYSIS, MICROSCOPIC (REFLEX): RBC / HPF: NONE SEEN RBC/hpf (ref 0–5)

## 2021-02-08 LAB — RESP PANEL BY RT-PCR (FLU A&B, COVID) ARPGX2
Influenza A by PCR: NEGATIVE
Influenza B by PCR: NEGATIVE
SARS Coronavirus 2 by RT PCR: NEGATIVE

## 2021-02-08 LAB — LIPASE, BLOOD: Lipase: 32 U/L (ref 11–51)

## 2021-02-08 MED ORDER — METOPROLOL SUCCINATE ER 100 MG PO TB24: 100.0000 mg | ORAL_TABLET | Freq: Two times a day (BID) | ORAL | Status: DC

## 2021-02-08 MED ORDER — FENTANYL CITRATE PF 50 MCG/ML IJ SOSY
50.0000 ug | PREFILLED_SYRINGE | Freq: Once | INTRAMUSCULAR | Status: AC
  Administered 2021-02-08: 50 ug via INTRAVENOUS
  Filled 2021-02-08: qty 1

## 2021-02-08 MED ORDER — LOSARTAN POTASSIUM 50 MG PO TABS
100.0000 mg | ORAL_TABLET | Freq: Every day | ORAL | Status: DC
Start: 2021-02-08 — End: 2021-02-10
  Administered 2021-02-08 – 2021-02-10 (×3): 100 mg via ORAL
  Filled 2021-02-08 (×3): qty 2

## 2021-02-08 MED ORDER — MONTELUKAST SODIUM 10 MG PO TABS
10.0000 mg | ORAL_TABLET | Freq: Every day | ORAL | Status: DC
Start: 2021-02-08 — End: 2021-02-10
  Administered 2021-02-08 – 2021-02-10 (×3): 10 mg via ORAL
  Filled 2021-02-08 (×3): qty 1

## 2021-02-08 MED ORDER — SODIUM CHLORIDE 0.9 % IV SOLN: INTRAVENOUS | Status: AC

## 2021-02-08 MED ORDER — HYDROCHLOROTHIAZIDE 25 MG PO TABS
25.0000 mg | ORAL_TABLET | Freq: Every day | ORAL | Status: DC
  Administered 2021-02-09 – 2021-02-10 (×2): 25 mg via ORAL
  Filled 2021-02-08 (×2): qty 1

## 2021-02-08 MED ORDER — ONDANSETRON HCL 4 MG/2ML IJ SOLN
4.0000 mg | Freq: Once | INTRAMUSCULAR | Status: AC
  Administered 2021-02-08: 4 mg via INTRAVENOUS
  Filled 2021-02-08: qty 2

## 2021-02-08 MED ORDER — PANTOPRAZOLE SODIUM 20 MG PO TBEC
20.0000 mg | DELAYED_RELEASE_TABLET | Freq: Every day | ORAL | Status: DC
Start: 2021-02-08 — End: 2021-02-10
  Administered 2021-02-08 – 2021-02-10 (×3): 20 mg via ORAL
  Filled 2021-02-08 (×4): qty 1

## 2021-02-08 MED ORDER — BUPROPION HCL ER (XL) 300 MG PO TB24
300.0000 mg | ORAL_TABLET | Freq: Every day | ORAL | Status: DC
  Administered 2021-02-09 – 2021-02-10 (×2): 300 mg via ORAL
  Filled 2021-02-08 (×2): qty 1

## 2021-02-08 MED ORDER — ALBUTEROL SULFATE (2.5 MG/3ML) 0.083% IN NEBU: 3.0000 mL | INHALATION_SOLUTION | Freq: Four times a day (QID) | RESPIRATORY_TRACT | Status: DC | PRN

## 2021-02-08 MED ORDER — FENTANYL CITRATE PF 50 MCG/ML IJ SOSY: 12.5000 ug | PREFILLED_SYRINGE | INTRAMUSCULAR | Status: DC | PRN

## 2021-02-08 MED ORDER — SODIUM CHLORIDE 0.9 % IV BOLUS
1000.0000 mL | Freq: Once | INTRAVENOUS | Status: AC
  Administered 2021-02-08: 1000 mL via INTRAVENOUS

## 2021-02-08 MED ORDER — ONDANSETRON HCL 4 MG/2ML IJ SOLN: 4.0000 mg | Freq: Four times a day (QID) | INTRAMUSCULAR | Status: DC | PRN

## 2021-02-08 MED ORDER — AMLODIPINE BESYLATE 5 MG PO TABS
5.0000 mg | ORAL_TABLET | Freq: Every day | ORAL | Status: DC
  Administered 2021-02-08 – 2021-02-10 (×3): 5 mg via ORAL
  Filled 2021-02-08 (×3): qty 1

## 2021-02-08 MED ORDER — IOHEXOL 300 MG/ML  SOLN
100.0000 mL | Freq: Once | INTRAMUSCULAR | Status: AC | PRN
  Administered 2021-02-08: 100 mL via INTRAVENOUS

## 2021-02-08 MED ORDER — PRAVASTATIN SODIUM 20 MG PO TABS
40.0000 mg | ORAL_TABLET | Freq: Every day | ORAL | Status: DC
  Administered 2021-02-08 – 2021-02-10 (×3): 40 mg via ORAL
  Filled 2021-02-08 (×3): qty 2

## 2021-02-08 NOTE — ED Notes (Signed)
Refuses NG tube at this time, explained rationale for tube, stated she would rather just get pain medicine.

## 2021-02-08 NOTE — ED Notes (Signed)
Ultrasound IV x 2 attempted in Left AC, access obtained, flushing attempted and IV will not flush, pt c/o pain at sites. So IV removed, 2nd RN in to attempt 22g in left hand, successful able to administer IVF at slower rate and administer IV meds as ordered.

## 2021-02-08 NOTE — ED Notes (Signed)
Patient transported to CT 

## 2021-02-08 NOTE — ED Notes (Signed)
Client states she had a large BM.

## 2021-02-08 NOTE — Progress Notes (Signed)
Notified by EDP of need for admission d/t SBO. [facility] accepts patient to med-surg at [facility]. EDP is to remain responsible for orders/medical decisions while patient is holding at [facility]. Upon arrival to [facility], [facility] will assume care. Nursing staff will call patient placement to notify them of patient's arrival so that the proper [facility] member may receive the patient. Nursing staff will notify the following consultants, General Surgery, of patient's arrival for their evaluation. Thank you.

## 2021-02-08 NOTE — H&P (Addendum)
History and Physical    Cynthia Peters:[id number] DOB: 05-Jan-1951 DOA: 02/08/2021  PCP: Aletha Halim., PA-C Patient coming from: [facility] ED  Chief Complaint: Abdominal pain  HPI: Cynthia Peters is a 71 y.o. female with medical history significant of multiple abdominal surgeries, multiple SBO's, GERD, IBS, hyperlipidemia, hypertension, anxiety, depression, TIA, DVT, asthma presented to the ED complaining of abdominal pain.  No fever.  Labs showing WBC 13.3.  Lipase normal and no significant elevation of LFTs.  UA with negative nitrite, trace leukocytes, 0-5 WBCs, and many bacteria.  COVID and influenza PCR negative.  CT showing partial SBO, may be related to adhesions although exact level of transition is not clearly identified.  Liver with fatty infiltration and few cysts.  Diverticulosis of the colon without signs of focal acute diverticulitis.  There is first-degree spondylolisthesis at L4-L5 level along with marked spinal stenosis. Patient refused NG tube placement.  She was given fentanyl, Zofran, and 1 L normal saline bolus.  General surgery consulted by ED physician.  Patient states she has had multiple abdominal surgeries for hernia repair and has had bowel obstructions multiple times in the past.  States yesterday after dinner she started having generalized abdominal pain.  No nausea or vomiting.  Today in the emergency room she had a large bowel movement.  States she prefers not getting an NG tube because it caused a lot of discomfort in the past.  Denies fevers.  Denies cough, shortness of breath, or chest pain.  No other complaints.  Review of Systems:  All systems reviewed and apart from history of presenting illness, are negative.  Past Medical History:  Diagnosis Date   Anemia    history in the past, not on a regular basis   Arthritis    Bronchitis    Cataracts, bilateral    immature   Claustrophobia    takes Valium daily as needed   Complication of anesthesia     2013 SURGERY vocal cord bothering pt, used smaller ent tube after 2 hernia repair, no problem after . " I woke up during my MRI and colonoscopy."        Depression    takes Wellbutrin daily   Dry eye    Dry mouth    GERD (gastroesophageal reflux disease)    takes Omeprazole daily    H/O hiatal hernia    Headache    migraines with flashing lights    History of blood clots 40 yrs    leg    History of echocardiogram 2013   a. Echo (05/2011):  Mild LVH, EF 65-70%, no WMA, Gr 1 DD, mild AI.   History of MRSA infection    History of staph infection    History of stress test 2010   a. ETT-Echo in 2010 was normal   History of TIA (transient ischemic attack) 2013   a. Carotid US (05/2011):  No ICA stenosis, pt unsure of this   Hyperlipidemia    takes Simvastatin daily    Hypertension    takes  Losartan daily   IBS (irritable bowel syndrome)    takes Bentyl daily as needed   Insomnia    takes Ambien nightly   Interstitial cystitis    Lichen planus    Mild asthma    Albuterol inhaler as needed   Palpitations    Event monitor in 2007 with rare PVCs // takes Metoprolol daily   PVC (premature ventricular contraction)    Seasonal allergies  takes Claritin daily as needed.Takes Singulair daily as needed.   TIA (transient ischemic attack) 05/27/2011   Urethral stricture    Vertigo    takes Meclizine daily as needed    Past Surgical History:  Procedure Laterality Date   ARTHROTOMY Right 09/21/2020   Procedure: Right elbow open arthrotomy and synovectomy with radial head implant removal and repair reconstruction as necessary including ligamentous repair;  Surgeon: Roseanne Kaufman, MD;  Location: [place];  Service: Orthopedics;  Laterality: Right;  161min   CESAREAN SECTION     COLONOSCOPY WITH PROPOFOL N/A 08/10/2012   Procedure: COLONOSCOPY WITH PROPOFOL;  Surgeon: Garlan Fair, MD;  Location: [facility];  Service: Endoscopy;  Laterality: N/A;   CYSTOSCOPY WITH URETHRAL DILATATION  N/A 02/18/2013   Procedure: CYSTOSCOPY WITH URETHRAL DILATATION ( NO BALLOON) AND HYDRODISTENSION;  Surgeon: Alexis Frock, MD;  Location: [facility];  Service: Urology;  Laterality: N/A;   ENDOMETRIAL ABLATION W/ HYDROTHERMABLATOR  09-27-2002   INCISIONAL HERNIA REPAIR N/A 10/25/2014   Procedure: HERNIA REPAIR INCISIONAL;  Surgeon: Ralene Ok, MD;  Location: [place];  Service: General;  Laterality: N/A;   [place] N/A 03/26/2015   Procedure: LAPAROSCOPIC INCISIONAL HERNIA;  Surgeon: Ralene Ok, MD;  Location: [place];  Service: General;  Laterality: N/A;   INSERTION OF MESH N/A 10/25/2014   Procedure: INSERTION OF MESH;  Surgeon: Ralene Ok, MD;  Location: [place];  Service: General;  Laterality: N/A;   LAPAROSCOPIC LYSIS OF ADHESIONS N/A 03/26/2015   Procedure: LAPAROSCOPIC LYSIS OF ADHESIONS;  Surgeon: Ralene Ok, MD;  Location: [place];  Service: General;  Laterality: N/A;   LAPAROTOMY N/A 10/25/2014   Procedure: EXPLORATORY LAPAROTOMY;  Surgeon: Ralene Ok, MD;  Location: [place];  Service: General;  Laterality: N/A;   LYSIS OF ADHESION N/A 10/25/2014   Procedure: LYSIS OF ADHESIONS ;  Surgeon: Ralene Ok, MD;  Location: [place];  Service: General;  Laterality: N/A;   MUCOSAL ADVANCEMENT FLAP N/A 10/25/2014   Procedure: MUCOSAL ADVANCEMENT FLAP;  Surgeon: Ralene Ok, MD;  Location: [place];  Service: General;  Laterality: N/A;   ORIF RADIAL FRACTURE Right 06/17/2016   Procedure: right radial head arthroplasty with ligament repair, reconstruciton;  Surgeon: Roseanne Kaufman, MD;  Location: [place];  Service: Orthopedics;  Laterality: Right;   RADIAL HEAD ARTHROPLASTY Right 06/17/2016   TOOTH EXTRACTION     TRANSTHORACIC ECHOCARDIOGRAM  05-26-2011   MILD LVH/  EF 62-26%/  GRADE I DIASTOLIC DYSFUNCTION/  MILD AR//   06-20-2008  NOMRAL STRESS ECHO   UMBILICAL HERNIA REPAIR  2013   AND REVISION THE SAME YEAR W/ MESH   WISDOM TOOTH EXTRACTION  AGE 57      reports that she quit smoking about 45 years ago. Her smoking use included cigarettes. She has a 0.50 pack-year smoking history. She has never used smokeless tobacco. She reports current alcohol use. She reports that she does not use drugs.  Allergies  Allergen Reactions   Hydrocodone Rash   Hydrocodone-Acetaminophen Rash   Tramadol Palpitations    Family History  Problem Relation Age of Onset   Dementia Mother    Leukemia Child    Cirrhosis Father     Prior to Admission medications   Medication Sig Start Date End Date Taking? Authorizing Provider  acetaminophen (TYLENOL) 500 MG tablet Take 1,000 mg by mouth every 6 (six) hours as needed (pain).    Yes [provider]  albuterol (VENTOLIN HFA) 108 (90 Base) MCG/ACT  inhaler Inhale 2 puffs into the lungs every 6 (six) hours as needed for wheezing or shortness of breath.   Yes [provider]  amLODipine (NORVASC) 5 MG tablet Take 5 mg by mouth daily. 05/21/20  Yes [provider]  aspirin 81 MG chewable tablet Chew 81 mg by mouth daily.   Yes [provider]  buPROPion (WELLBUTRIN XL) 300 MG 24 hr tablet Take 300 mg by mouth daily.   Yes [provider]  Cholecalciferol (VITAMIN D) 2000 units CAPS Take 2,000 Units by mouth daily.   Yes [provider]  clobetasol cream (TEMOVATE) 2.26 % Apply 1 application topically 2 (two) times daily as needed (rash).  04/01/16  Yes [provider]  Coenzyme Q10 (CO Q 10) 100 MG CAPS Take 100 mg by mouth daily.    Yes [provider]  [employer] VITAMIN C 1000 MG tablet Take 2,000 mg by mouth daily. 06/13/16  Yes [provider]  Evening Primrose Oil 1000 MG CAPS Take 1,000 mg by mouth daily.   Yes [provider]  fexofenadine (ALLEGRA) 180 MG tablet Take 180 mg by mouth daily.   Yes [provider]  Flaxseed, Linseed, 1000 MG CAPS Take 1,000 mg by mouth daily.   Yes [provider]  fluticasone  (FLONASE) 50 MCG/ACT nasal spray Place 2 sprays into both nostrils daily as needed for allergies or rhinitis.   Yes [provider]  hydrochlorothiazide 25 MG tablet Take 25 mg by mouth daily.   Yes [provider]  hydrocortisone cream 1 % Apply 1 application topically 4 (four) times daily as needed for itching.   Yes [provider]  ibuprofen (ADVIL,MOTRIN) 200 MG tablet Take 400-600 mg by mouth 2 (two) times daily as needed (pain).    Yes [provider]  losartan (COZAAR) 100 MG tablet TAKE 1 TABLET BY MOUTH EVERY DAY Patient taking differently: Take 100 mg by mouth daily. 09/14/17  Yes Martinique, Peter M, MD  magnesium oxide (MAG-OX) 400 MG tablet Take 400 mg by mouth daily.   Yes [provider]  meclizine (ANTIVERT) 25 MG tablet Take 1 tablet (25 mg total) by mouth 3 (three) times daily as needed for dizziness. 05/02/20  Yes Robinson, [place] N, PA-C  metoprolol succinate (TOPROL-XL) 100 MG 24 hr tablet Take 1 tablet (100 mg total) by mouth 2 (two) times daily. Take with or immediately following a meal. Take 100 mg by mouth 2 (two) times daily. Take with or immediately following a meal. Patient taking differently: Take 100 mg by mouth 2 (two) times daily. Take with or immediately following a meal. 07/14/18  Yes [place], Peter M, MD  montelukast (SINGULAIR) 10 MG tablet Take 10 mg by mouth daily.   Yes [provider]  Multiple Vitamins-Minerals (MULTIPLE VITAMINS/WOMENS PO) Take 1 tablet by mouth daily.   Yes [provider]  mupirocin ointment (BACTROBAN) 2 % Apply 1 application topically 3 (three) times daily as needed for rash. 06/20/20  Yes [provider]  pantoprazole (PROTONIX) 20 MG tablet Take 20 mg by mouth daily. 05/21/20  Yes [provider]  phenazopyridine (PYRIDIUM) 200 MG tablet Take 200 mg by mouth 3 (three) times daily as needed for pain (interstitial cystitis).   Yes [provider]  Polyethyl  Glyc-Propyl Glyc PF (SYSTANE ULTRA PF) 0.4-0.3 % SOLN Place 1 drop into both eyes 4 (four) times daily as needed (Dry eye).   Yes [provider]  polyethylene glycol (  MIRALAX / GLYCOLAX) 17 g packet Take 17 g by mouth 2 (two) times daily. Patient taking differently: Take 17 g by mouth daily. 11/30/19  Yes Ghimire, Dante Gang, MD  potassium chloride SA (KLOR-CON M20) 20 MEQ tablet TAKE 1&1/2 TABLETS BY MOUTH DAILY. Patient taking differently: Take 30 mEq by mouth daily. 02/16/20  Yes Martinique, Peter M, MD  pravastatin (PRAVACHOL) 40 MG tablet Take 40 mg by mouth daily. 05/21/20  Yes [provider]  Probiotic Product (ALIGN PO) Take 1 capsule by mouth 2 (two) times daily.   Yes [provider]  valACYclovir (VALTREX) 500 MG tablet INCREASE TO TAKING 1 TABLET 2 TIMES A DAY FOR 3 DAYS WITH OUTBREAK Patient taking differently: Take 500 mg by mouth daily. 03/25/16  Yes Kem Boroughs, FNP    Physical Exam: Vitals:   02/08/21 1700 02/08/21 1745 02/08/21 1815 02/08/21 1934  BP: 132/68 139/73 135/66 135/64  Pulse: (!) 58 61 63 (!) 59  Resp: 15 13 18 18   Temp:    98.8 F (37.1 C)  TempSrc:    Oral  SpO2: 94% 95% 96% 98%  Weight:      Height:        Physical Exam Constitutional:      General: She is not in acute distress. HENT:     Head: Normocephalic and atraumatic.  Eyes:     Extraocular Movements: Extraocular movements intact.     Conjunctiva/sclera: Conjunctivae normal.  Cardiovascular:     Rate and Rhythm: Normal rate and regular rhythm.     Pulses: Normal pulses.  Pulmonary:     Effort: Pulmonary effort is normal. No respiratory distress.     Breath sounds: Normal breath sounds. No wheezing or rales.  Abdominal:     General: Bowel sounds are normal.     Palpations: Abdomen is soft.     Tenderness: There is no abdominal tenderness. There is no guarding or rebound.     Comments: Mild abdominal distention  Musculoskeletal:        General: No swelling or  tenderness.     Cervical back: Normal range of motion and neck supple.  Skin:    General: Skin is warm and dry.  Neurological:     General: No focal deficit present.     Mental Status: She is alert and oriented to person, place, and time.     Labs on Admission: I have personally reviewed following labs and imaging studies  CBC: Recent Labs  Lab 02/08/21 0816  WBC 13.3*  NEUTROABS 11.0*  HGB 14.5  HCT 41.6  MCV 93.5  PLT 277   Basic Metabolic Panel: Recent Labs  Lab 02/08/21 0816  NA 138  K 4.0  CL 102  CO2 25  GLUCOSE 130*  BUN 24*  CREATININE 0.76  CALCIUM 9.4   GFR: Estimated Creatinine Clearance: 64.9 mL/min (by C-G formula based on SCr of 0.76 mg/dL). Liver Function Tests: Recent Labs  Lab 02/08/21 0816  AST 35  ALT 31  ALKPHOS 66  BILITOT 1.3*  PROT 6.6  ALBUMIN 4.1   Recent Labs  Lab 02/08/21 0816  LIPASE 32   No results for input(s): AMMONIA in the last 168 hours. Coagulation Profile: No results for input(s): INR, PROTIME in the last 168 hours. Cardiac Enzymes: No results for input(s): CKTOTAL, CKMB, CKMBINDEX, TROPONINI in the last 168 hours. BNP (last 3 results) No results for input(s): PROBNP in the last 8760 hours. HbA1C: No results for input(s): HGBA1C in the last  72 hours. CBG: No results for input(s): GLUCAP in the last 168 hours. Lipid Profile: No results for input(s): CHOL, HDL, LDLCALC, TRIG, CHOLHDL, LDLDIRECT in the last 72 hours. Thyroid Function Tests: No results for input(s): TSH, T4TOTAL, FREET4, T3FREE, THYROIDAB in the last 72 hours. Anemia Panel: No results for input(s): VITAMINB12, FOLATE, FERRITIN, TIBC, IRON, RETICCTPCT in the last 72 hours. Urine analysis:    Component Value Date/Time   COLORURINE YELLOW 02/08/2021 [place] 02/08/2021 0737   LABSPEC >=1.030 02/08/2021 0737   PHURINE 6.0 02/08/2021 0737   GLUCOSEU NEGATIVE 02/08/2021 0737   HGBUR NEGATIVE 02/08/2021 0737   BILIRUBINUR NEGATIVE  02/08/2021 0737   BILIRUBINUR n 10/20/2014 0949   KETONESUR NEGATIVE 02/08/2021 0737   PROTEINUR NEGATIVE 02/08/2021 0737   UROBILINOGEN negative 10/20/2014 0949   UROBILINOGEN 0.2 09/09/2014 0942   NITRITE NEGATIVE 02/08/2021 0737   LEUKOCYTESUR TRACE (A) 02/08/2021 0737    Radiological Exams on Admission: CT Abdomen Pelvis W Contrast  Result Date: 02/08/2021 CLINICAL DATA:  Abdominal pain EXAM: CT ABDOMEN AND PELVIS WITH CONTRAST TECHNIQUE: Multidetector CT imaging of the abdomen and pelvis was performed using the standard protocol following bolus administration of intravenous contrast. CONTRAST:  127mL OMNIPAQUE IOHEXOL 300 MG/ML  SOLN COMPARISON:  08/24/2020 FINDINGS: Lower chest: Unremarkable. Hepatobiliary: There is fatty infiltration. There are few smooth marginated low-density lesions in the liver largest measuring 2.8 cm in diameter with no significant interval change. There is no dilation of bile ducts. Gallbladder is unremarkable. Pancreas: No focal abnormality is seen. Spleen: Spleen measures 11.7 cm in maximum diameter. Adrenals/Urinary Tract: Adrenals are unremarkable. There is no hydronephrosis. There are no renal or ureteral stones. Urinary bladder is not distended. Stomach/Bowel: There is dilation of distal jejunum and proximal ileum measuring up to 2.9 cm in diameter. Distal ileum appears to be smaller than usual in caliber. Exact level of transition is not distinctly identified. Small amount of free fluid is noted adjacent to dilated small bowel loops in the right pelvic cavity. Appendix is not distinctly seen. There is no focal pericecal inflammation. There is no significant wall thickening in colon. Few diverticula are noted in colon without signs of focal diverticulitis. There is no pericolic stranding. Vascular/Lymphatic: Unremarkable. Reproductive: Unremarkable. Other: There is no pneumoperitoneum. There is diastasis between rectus muscles in the suprapubic region which may be  residual sequela from previous surgery. Small right inguinal hernia containing fat is seen. Musculoskeletal: There is first-degree spondylolisthesis at L4-L5 level. Severe lumbar spinal stenosis is seen at L4-L5 level. There is encroachment of neural foramina at multiple levels in the lumbar spine. IMPRESSION: There is abnormal dilation of distal jejunum and proximal ileum suggesting partial small bowel obstruction. This may be related to adhesions. Exact level of transition is not clearly identified. There is decompression of distal and terminal ileum. Small amount free fluid adjacent to dilated small bowel loops may be related to partial small bowel obstruction or enteritis. There is no hydronephrosis. There are few cysts in the liver. There is fatty infiltration in the liver. Diverticulosis of colon without signs of focal acute diverticulitis. There is first-degree spondylolisthesis at L4-L5 level along with marked spinal stenosis. Electronically Signed   By: Elmer Picker M.D.   On: 02/08/2021 09:21    EKG: Independently reviewed.  Sinus rhythm, LAFB.  No significant change since prior tracing.  Assessment/Plan Principal Problem:   SBO (small bowel obstruction) (HCC) Active Problems:   Hypertension   Anxiety   GERD (gastroesophageal reflux  disease)   Asymptomatic bacteriuria   Recurrent SBO Patient with history of multiple abdominal surgeries and multiple SBO's.  She is presenting with 1 day history of generalized abdominal pain.  CT showing partial SBO, may be related to adhesions although exact level of transition is not clearly identified.  Patient refused NG tube.  She is not vomiting and did have a large bowel movement in the emergency room. -General surgery consulted.  Keep n.p.o., bowel rest.  IV fluid hydration, pain management.  Monitor electrolytes.  Hold home aspirin.  Mild leukocytosis Likely reactive.  WBC count slightly elevated on previous labs as well. -Continue to  monitor  Asymptomatic bacteriuria UA with negative nitrite, trace leukocytes, 0-5 WBCs, and many bacteria.  Patient is not endorsing any UTI symptoms. -Urine culture  Abnormal CT finding CT showing first-degree spondylolisthesis at L4-L5 level along with marked spinal stenosis.  Patient is not endorsing any back pain or red flag symptoms. -Outpatient neurosurgery follow  Asthma Stable.  No signs of acute exacerbation. -Continue home medications  Hypertension Stable. -Continue home medications  Addendum: Toprol held due to slight bradycardia.  Blood pressure stable.  Depression, anxiety -Continue home medication  GERD -Continue PPI  Hyperlipidemia -Continue statin  DVT prophylaxis: SCDs Code Status: Patient wishes to be full code. Family Communication: No family available at this time. Disposition Plan: Status is: Observation  The patient remains OBS appropriate and will d/c before 2 midnights.  Level of care: Level of care: Med-Surg  The medical decision making on this patient was of high complexity and the patient is at high risk for clinical deterioration, therefore this is a level 3 visit.  Shela Leff MD [facility]  If 7PM-7AM, please contact night-coverage [email or web address]  02/08/2021, 9:16 PM

## 2021-02-08 NOTE — ED Notes (Signed)
ED Provider at bedside. 

## 2021-02-08 NOTE — ED Notes (Signed)
Client states her pain is from suprapubic area to here breast bone.

## 2021-02-08 NOTE — ED Provider Notes (Signed)
[facility] EMERGENCY DEPARTMENT Provider Note   CSN: [id number] Arrival date & time: 02/08/21  0710     History  Chief Complaint  Patient presents with   Abdominal Pain    Cynthia Peters is a 71 y.o. female.  Patient is a 71 year old female who presents with abdominal pain.  She says it started about 4 hours ago.  Its fairly diffuse.  It goes throughout her abdomen up into her chest area and throughout her entire back.  She denies any nausea vomiting or diarrhea.  No urinary symptoms other than her chronic interstitial cystitis symptoms.  No known fevers.  She said she is been having bowel movements normally.  She does feel like she is passing gas.  She has had prior abdominal surgeries and multiple prior bowel obstructions.  She also per chart review has a known history of thoracic aortic dissection.      Home Medications Prior to Admission medications   Medication Sig Start Date End Date Taking? Authorizing Provider  acetaminophen (TYLENOL) 500 MG tablet Take 1,000 mg by mouth every 6 (six) hours as needed (pain).     [provider]  albuterol (VENTOLIN HFA) 108 (90 Base) MCG/ACT inhaler Inhale 2 puffs into the lungs every 6 (six) hours as needed for wheezing or shortness of breath.    [provider]  amLODipine (NORVASC) 5 MG tablet Take 5 mg by mouth daily. 05/21/20   [provider]  aspirin 81 MG chewable tablet Chew 81 mg by mouth daily.    [provider]  buPROPion (WELLBUTRIN XL) 300 MG 24 hr tablet Take 300 mg by mouth daily.    [provider]  Cholecalciferol (VITAMIN D) 2000 units CAPS Take 2,000 Units by mouth daily.    [provider]  clobetasol cream (TEMOVATE) 4.09 % Apply 1 application topically 2 (two) times daily as needed (rash).  04/01/16   [provider]  Coenzyme Q10 (CO Q 10) 100 MG CAPS Take 100 mg by mouth daily.     [provider]  [employer] VITAMIN C 1000 MG tablet Take  2,000 mg by mouth daily. 06/13/16   [provider]  Evening Primrose Oil 1000 MG CAPS Take 1,000 mg by mouth daily.    [provider]  fexofenadine (ALLEGRA) 180 MG tablet Take 180 mg by mouth daily.    [provider]  Flaxseed, Linseed, 1000 MG CAPS Take 1,000 mg by mouth daily.    [provider]  fluticasone (FLONASE) 50 MCG/ACT nasal spray Place 2 sprays into both nostrils daily as needed for allergies or rhinitis.    [provider]  hydrochlorothiazide 25 MG tablet Take 25 mg by mouth daily.    [provider]  hydrocortisone cream 1 % Apply 1 application topically 4 (four) times daily as needed for itching.    [provider]  ibuprofen (ADVIL,MOTRIN) 200 MG tablet Take 400-600 mg by mouth 2 (two) times daily as needed (pain).     [provider]  losartan (COZAAR) 100 MG tablet TAKE 1 TABLET BY MOUTH EVERY DAY Patient taking differently: Take 100 mg by mouth daily. 09/14/17   Martin[place], Peter M, MD  magnesium oxide (MAG-OX) 400 MG tablet Take 400 mg by mouth daily.    [provider]  meclizine (ANTIVERT) 25 MG tablet Take 1 tablet (25 mg total) by mouth 3 (three) times daily as needed for dizziness. 05/02/20   Robinson, [place] N, PA-C  metoprolol  succinate (TOPROL-XL) 100 MG 24 hr tablet Take 1 tablet (100 mg total) by mouth 2 (two) times daily. Take with or immediately following a meal. Take 100 mg by mouth 2 (two) times daily. Take with or immediately following a meal. 07/14/18   Martin[place], Peter M, MD  montelukast (SINGULAIR) 10 MG tablet Take 10 mg by mouth daily.    [provider]  Multiple Vitamins-Minerals (MULTIPLE VITAMINS/WOMENS PO) Take 1 tablet by mouth daily.    [provider]  mupirocin ointment (BACTROBAN) 2 % Apply 1 application topically 3 (three) times daily as needed for rash. 06/20/20   [provider]  pantoprazole (PROTONIX) 20 MG tablet Take 20 mg by mouth daily.  05/21/20   [provider]  phenazopyridine (PYRIDIUM) 200 MG tablet Take 200 mg by mouth 3 (three) times daily as needed for pain (interstitial cystitis).    [provider]  Polyethyl Glyc-Propyl Glyc PF (SYSTANE ULTRA PF) 0.4-0.3 % SOLN Place 1 drop into both eyes daily as needed (Dry eye).    [provider]  polyethylene glycol (MIRALAX / GLYCOLAX) 17 g packet Take 17 g by mouth 2 (two) times daily. Patient taking differently: Take 17 g by mouth daily. 11/30/19   Barb Merino, MD  potassium chloride SA (KLOR-CON M20) 20 MEQ tablet TAKE 1&1/2 TABLETS BY MOUTH DAILY. Patient taking differently: Take 30 mEq by mouth daily. 02/16/20   Martin[place], Peter M, MD  pravastatin (PRAVACHOL) 40 MG tablet Take 40 mg by mouth daily. 05/21/20   [provider]  Probiotic Product (ALIGN PO) Take 1 capsule by mouth 2 (two) times daily.    [provider]  valACYclovir (VALTREX) 500 MG tablet INCREASE TO TAKING 1 TABLET 2 TIMES A DAY FOR 3 DAYS WITH OUTBREAK Patient taking differently: Take 500 mg by mouth daily. 03/25/16   Kem Boroughs, FNP      Allergies    Hydrocodone, Hydrocodone-acetaminophen, and Tramadol    Review of Systems   Review of Systems  Constitutional:  Negative for chills, diaphoresis, fatigue and fever.  HENT:  Negative for congestion, rhinorrhea and sneezing.   Eyes: Negative.   Respiratory:  Negative for cough, chest tightness and shortness of breath.   Cardiovascular:  Positive for chest pain. Negative for leg swelling.  Gastrointestinal:  Positive for abdominal distention and abdominal pain. Negative for blood in stool, diarrhea, nausea and vomiting.  Genitourinary:  Negative for difficulty urinating, flank pain, frequency and hematuria.  Musculoskeletal:  Negative for arthralgias and back pain.  Skin:  Negative for rash.  Neurological:  Negative for dizziness, speech difficulty, weakness, numbness and headaches.   Physical Exam Updated  Vital Signs BP (!) 163/74 (BP Location: Left Arm)    Pulse 72    Temp 98.3 F (36.8 C) (Oral)    Resp 12    Ht 5\' 4"  (1.626 m)    Wt 72.6 kg    LMP 02/04/2004    SpO2 98%    BMI 27.47 kg/m  Physical Exam Constitutional:      Appearance: She is well-developed.  HENT:     Head: Normocephalic and atraumatic.  Eyes:     Pupils: Pupils are equal, round, and reactive to light.  Cardiovascular:     Rate and Rhythm: Normal rate and regular rhythm.     Heart sounds: Normal heart sounds.  Pulmonary:     Effort: Pulmonary effort is normal. No respiratory distress.     Breath sounds: Normal breath sounds. No wheezing  or rales.  Chest:     Chest wall: No tenderness.  Abdominal:     General: Bowel sounds are normal.     Palpations: Abdomen is soft.     Tenderness: There is generalized abdominal tenderness. There is no guarding or rebound.  Musculoskeletal:        General: Normal range of motion.     Cervical back: Normal range of motion and neck supple.  Lymphadenopathy:     Cervical: No cervical adenopathy.  Skin:    General: Skin is warm and dry.     Findings: No rash.  Neurological:     Mental Status: She is alert and oriented to person, place, and time.    ED Results / Procedures / Treatments   Labs (all labs ordered are listed, but only abnormal results are displayed) Labs Reviewed  URINALYSIS, ROUTINE W REFLEX MICROSCOPIC - Abnormal; Notable for the following components:      Result Value   Leukocytes,Ua TRACE (*)    All other components within normal limits  COMPREHENSIVE METABOLIC PANEL - Abnormal; Notable for the following components:   Glucose, Bld 130 (*)    BUN 24 (*)    Total Bilirubin 1.3 (*)    All other components within normal limits  CBC WITH DIFFERENTIAL/PLATELET - Abnormal; Notable for the following components:   WBC 13.3 (*)    Neutro Abs 11.0 (*)    All other components within normal limits  URINALYSIS, MICROSCOPIC (REFLEX) - Abnormal; Notable for the  following components:   Bacteria, UA MANY (*)    All other components within normal limits  RESP PANEL BY RT-PCR (FLU A&B, COVID) [id number]  LIPASE, BLOOD    EKG EKG Interpretation  Date/Time:  Friday February 08 2021 07:36:52 EST Ventricular Rate:  66 PR Interval:  168 QRS Duration: 96 QT Interval:  391 QTC Calculation: 410 R Axis:   -62 Text Interpretation: Sinus rhythm Left anterior fascicular block Abnormal R-wave progression, late transition since last tracing no significant change Confirmed by Malvin Johns [id number]) on 02/08/2021 7:57:11 AM  Radiology CT Abdomen Pelvis W Contrast  Result Date: 02/08/2021 CLINICAL DATA:  Abdominal pain EXAM: CT ABDOMEN AND PELVIS WITH CONTRAST TECHNIQUE: Multidetector CT imaging of the abdomen and pelvis was performed using the standard protocol following bolus administration of intravenous contrast. CONTRAST:  147mL OMNIPAQUE IOHEXOL 300 MG/ML  SOLN COMPARISON:  08/24/2020 FINDINGS: Lower chest: Unremarkable. Hepatobiliary: There is fatty infiltration. There are few smooth marginated low-density lesions in the liver largest measuring 2.8 cm in diameter with no significant interval change. There is no dilation of bile ducts. Gallbladder is unremarkable. Pancreas: No focal abnormality is seen. Spleen: Spleen measures 11.7 cm in maximum diameter. Adrenals/Urinary Tract: Adrenals are unremarkable. There is no hydronephrosis. There are no renal or ureteral stones. Urinary bladder is not distended. Stomach/Bowel: There is dilation of distal jejunum and proximal ileum measuring up to 2.9 cm in diameter. Distal ileum appears to be smaller than usual in caliber. Exact level of transition is not distinctly identified. Small amount of free fluid is noted adjacent to dilated small bowel loops in the right pelvic cavity. Appendix is not distinctly seen. There is no focal pericecal inflammation. There is no significant wall thickening in colon. Few diverticula are noted in  colon without signs of focal diverticulitis. There is no pericolic stranding. Vascular/Lymphatic: Unremarkable. Reproductive: Unremarkable. Other: There is no pneumoperitoneum. There is diastasis between rectus muscles in the suprapubic region which may be residual sequela from  previous surgery. Small right inguinal hernia containing fat is seen. Musculoskeletal: There is first-degree spondylolisthesis at L4-L5 level. Severe lumbar spinal stenosis is seen at L4-L5 level. There is encroachment of neural foramina at multiple levels in the lumbar spine. IMPRESSION: There is abnormal dilation of distal jejunum and proximal ileum suggesting partial small bowel obstruction. This may be related to adhesions. Exact level of transition is not clearly identified. There is decompression of distal and terminal ileum. Small amount free fluid adjacent to dilated small bowel loops may be related to partial small bowel obstruction or enteritis. There is no hydronephrosis. There are few cysts in the liver. There is fatty infiltration in the liver. Diverticulosis of colon without signs of focal acute diverticulitis. There is first-degree spondylolisthesis at L4-L5 level along with marked spinal stenosis. Electronically Signed   By: Elmer Picker M.D.   On: 02/08/2021 09:21    Procedures Procedures    Medications Ordered in ED Medications  sodium chloride 0.9 % bolus 1,000 mL (1,000 mLs Intravenous New Bag/Given 02/08/21 0826)  ondansetron (ZOFRAN) injection 4 mg (4 mg Intravenous Given 02/08/21 0823)  fentaNYL (SUBLIMAZE) injection 50 mcg (50 mcg Intravenous Given 02/08/21 0824)  iohexol (OMNIPAQUE) 300 MG/ML solution 100 mL (100 mLs Intravenous Contrast Given 02/08/21 0855)  fentaNYL (SUBLIMAZE) injection 50 mcg (50 mcg Intravenous Given 02/08/21 1114)    ED Course/ Medical Decision Making/ A&P                           Medical Decision Making Problems Addressed: SBO (small bowel obstruction) ([place]): acute illness or  injury that poses a threat to life or bodily functions  Amount and/or Complexity of Data Reviewed External Data Reviewed: radiology and notes. Labs: ordered. Decision-making details documented in ED Course. Radiology: ordered. Decision-making details documented in ED Course. ECG/medicine tests: ordered and independent interpretation performed. Decision-making details documented in ED Course.  Risk Parenteral controlled substances. Decision regarding hospitalization.   Patient is a 71 year old female who presents with abdominal pain.  I reviewed her old records and she has had multiple small bowel obstructions in the past.  She had labs obtained which were interpreted by me.  She has an elevation in her WBC count but otherwise her labs are nonconcerning.  CT scan shows evidence of a partial small bowel obstruction.  I informed her that the typical treatment would be NG tube placement.  She is refusing this.  She says that last for small bowel obstructions that she has had in the past have resolved without NG tube placement.  She was given IV pain medications and IV fluids.  I discussed the case with Dr. Marylyn Ishihara with the hospitalist service who has accepted the patient for transfer/admission.  He requested a surgical consult as well.  I spoke with Dr. Zenia Resides with general surgery who will consult on the patient once arrival to the hospital.  Final Clinical Impression(s) / ED Diagnoses Final diagnoses:  SBO (small bowel obstruction) [facility])    Rx / DC Orders ED Discharge Orders     None         Malvin Johns, MD 02/08/21 1129

## 2021-02-08 NOTE — ED Notes (Signed)
[facility] here for transport. Report given to paramedic at bedside.

## 2021-02-08 NOTE — ED Notes (Signed)
Called [facility] for transport

## 2021-02-08 NOTE — ED Triage Notes (Signed)
Having abd pain, onset approx 4 hours ago. Denies nausea, vomiting or diarrhea, no fevers.

## 2021-02-08 NOTE — Plan of Care (Addendum)
New admission this evening. Call sent out to [facility] RN for admission orders @ 1920 hrs; awaiting admit orders at this time.  Update: 2020 hrs: Page sent to [facility] Night admitter, Dr. Marlowe Sax, requesting admit orders; awaiting response.  Update: 2052 hrs: Admit orders noted.   Update: 2056 hrs: Dr. Georgette Dover paged per Dr. Marlowe Sax.  Update: 0550 hrs: No abdominal pain or nausea overnight.   Problem: Education: Goal: Knowledge of General Education information will improve Description: Including pain rating scale, medication(s)/side effects and non-pharmacologic comfort measures Outcome: Progressing   Problem: Health Behavior/Discharge Planning: Goal: Ability to manage health-related needs will improve Outcome: Progressing   Problem: Clinical Measurements: Goal: Ability to maintain clinical measurements within normal limits will improve Outcome: Progressing Goal: Will remain free from infection Outcome: Progressing Goal: Diagnostic test results will improve Outcome: Progressing Goal: Respiratory complications will improve Outcome: Progressing Goal: Cardiovascular complication will be avoided Outcome: Progressing   Problem: Activity: Goal: Risk for activity intolerance will decrease Outcome: Progressing   Problem: Nutrition: Goal: Adequate nutrition will be maintained Outcome: Progressing   Problem: Elimination: Goal: Will not experience complications related to bowel motility Outcome: Progressing Goal: Will not experience complications related to urinary retention Outcome: Progressing   Problem: Pain Managment: Goal: General experience of comfort will improve Outcome: Progressing   Problem: Safety: Goal: Ability to remain free from injury will improve Outcome: Progressing   Problem: Skin Integrity: Goal: Risk for impaired skin integrity will decrease Outcome: Progressing

## 2021-02-09 ENCOUNTER — Observation Stay (HOSPITAL_COMMUNITY): Payer: Medicare PPO

## 2021-02-09 DIAGNOSIS — K56609 Unspecified intestinal obstruction, unspecified as to partial versus complete obstruction: Secondary | ICD-10-CM | POA: Diagnosis not present

## 2021-02-09 LAB — BASIC METABOLIC PANEL
Anion gap: 10 (ref 5–15)
BUN: 15 mg/dL (ref 8–23)
CO2: 28 mmol/L (ref 22–32)
Calcium: 8.9 mg/dL (ref 8.9–10.3)
Chloride: 101 mmol/L (ref 98–111)
Creatinine, Ser: 0.67 mg/dL (ref 0.44–1.00)
GFR, Estimated: >60 mL/min (ref >60)
Glucose, Bld: 89 mg/dL (ref 70–99)
Potassium: 3.5 mmol/L (ref 3.5–5.1)
Sodium: 139 mmol/L (ref 135–145)

## 2021-02-09 LAB — CBC
HCT: 39 % (ref 36.0–46.0)
Hemoglobin: 13.4 g/dL (ref 12.0–15.0)
MCH: 33.1 pg (ref 26.0–34.0)
MCHC: 34.4 g/dL (ref 30.0–36.0)
MCV: 96.3 fL (ref 80.0–100.0)
Platelets: 178 10*3/uL (ref 150–400)
RBC: 4.05 MIL/uL (ref 3.87–5.11)
RDW: 12.5 % (ref 11.5–15.5)
WBC: 7.1 10*3/uL (ref 4.0–10.5)
nRBC: 0 % (ref 0.0–0.2)

## 2021-02-09 LAB — HIV ANTIBODY (ROUTINE TESTING W REFLEX): HIV Screen 4th Generation wRfx: NONREACTIVE

## 2021-02-09 MED ORDER — ACETAMINOPHEN 325 MG PO TABS
650.0000 mg | ORAL_TABLET | Freq: Four times a day (QID) | ORAL | Status: DC | PRN
  Administered 2021-02-09 (×2): 650 mg via ORAL
  Filled 2021-02-09 (×2): qty 2

## 2021-02-09 NOTE — Progress Notes (Addendum)
PROGRESS NOTE    Cynthia Peters  GDJ:[id number] DOB: 1950/04/29 DOA: 02/08/2021 PCP: Aletha Halim., PA-C   Brief Narrative:  This 71 years old female with PMH significant for multiple abdominal surgeries, multiple small bowel obstruction hospitalizations, GERD, IBS, hyperlipidemia, hypertension, anxiety, depression, TIA, DVT, asthma presenting the ED with complaints of abdominal pain associated with nausea and vomiting.  CT abdomen shows partial small bowel obstruction may be related to additions although exact level of transition not clearly identified. Patient is admitted for partial small bowel obstruction. She was kept NPO. started on IV hydration. Patient refused NG tube placement since she is not feeling nauseous.  Had a big bowel movement while in the ED.  General surgery consulted.  Patient is started on clear liquid diet if she tolerates well , can be discharged tomorrow morning.  Assessment & Plan:   Principal Problem:   SBO (small bowel obstruction) (HCC) Active Problems:   Hypertension   Anxiety   GERD (gastroesophageal reflux disease)   Asymptomatic bacteriuria  Partial small bowel obstruction: Patient presented with abdominal pain, nausea and vomiting. CT abdomen showed partial small bowel obstruction, may be related to adhesions although exact level of transition is not clearly identified. Patient does reports history of recurrent multiple abdominal surgeries and multiple SBO hospitalizations. Patient refused NG tube, Continue n.p.o. IV fluids. GI consulted,  Patient had a good bowel movement. Started on clear liquid diet,  continue IV hydration, Anticipated discharge home tomorrow.  Leukocytosis: Could be reactive secondary to SBO. Continue to monitor  Asymptomatic bacteriuria: UA shows trace leukocytes.   Patient denies any UTI symptoms.  Asthma: Continue home inhalers  Hypertension: Continue amlodipine, losartan, hydrochlorothiazide. Hold Toprol  because of bradycardia  GERD: Continue pantoprazole  Hyperlipidemia: Continue pravastatin  DVT prophylaxis: SCDs Code Status: Full code Family Communication: Son at bedside Disposition Plan:   Status is: Observation  The patient remains OBS appropriate and will d/c before 2 midnights.  Admitted for partial small bowel obstruction which is clinically resolved.  Patient started on clear liquid diet anticipated discharge home tomorrow.   Consultants:  General surgery  Procedures: None Antimicrobials: None  Subjective: Patient was seen and examined at bedside.  Overnight events noted.   Patient reports feeling better,  She is able to pass flatus.  She had a bowel movement in the ED.   She feels improved.  Objective: Vitals:   02/08/21 1934 02/09/21 0159 02/09/21 0622 02/09/21 1208  BP: 135/64 (!) 119/54 (!) 120/51 (!) 124/51  Pulse: (!) 59 61 63 (!) 59  Resp: 18 16 18 18   Temp: 98.8 F (37.1 C) 98.8 F (37.1 C) 98.4 F (36.9 C) 98.3 F (36.8 C)  TempSrc: Oral Oral Oral Oral  SpO2: 98% 98% 96% 95%  Weight:      Height:        Intake/Output Summary (Last 24 hours) at 02/09/2021 1427 Last data filed at 02/09/2021 0600 Gross per 24 hour  Intake 925.28 ml  Output --  Net 925.28 ml   Filed Weights   02/08/21 0720  Weight: 72.6 kg    Examination:  General exam: Appears comfortable, not in any acute distress. Respiratory system: Clear to auscultation. Respiratory effort normal. Cardiovascular system: S1-S2 heard, regular rate and rhythm, no murmur. Gastrointestinal system: Abdomen is soft, mildly tender, nondistended, BS+ Central nervous system: Alert and oriented. No focal neurological deficits. Extremities: No edema, no cyanosis, no clubbing. Skin: No rashes, lesions or ulcers Psychiatry: Judgement and insight appear normal.  Mood & affect appropriate.     Data Reviewed: I have personally reviewed following labs and imaging studies  CBC: Recent Labs  Lab  02/08/21 0816 02/09/21 0445  WBC 13.3* 7.1  NEUTROABS 11.0*  --   HGB 14.5 13.4  HCT 41.6 39.0  MCV 93.5 96.3  PLT 192 939   Basic Metabolic Panel: Recent Labs  Lab 02/08/21 0816 02/09/21 0445  NA 138 139  K 4.0 3.5  CL 102 101  CO2 25 28  GLUCOSE 130* 89  BUN 24* 15  CREATININE 0.76 0.67  CALCIUM 9.4 8.9   GFR: Estimated Creatinine Clearance: 64.9 mL/min (by C-G formula based on SCr of 0.67 mg/dL). Liver Function Tests: Recent Labs  Lab 02/08/21 0816  AST 35  ALT 31  ALKPHOS 66  BILITOT 1.3*  PROT 6.6  ALBUMIN 4.1   Recent Labs  Lab 02/08/21 0816  LIPASE 32   No results for input(s): AMMONIA in the last 168 hours. Coagulation Profile: No results for input(s): INR, PROTIME in the last 168 hours. Cardiac Enzymes: No results for input(s): CKTOTAL, CKMB, CKMBINDEX, TROPONINI in the last 168 hours. BNP (last 3 results) No results for input(s): PROBNP in the last 8760 hours. HbA1C: No results for input(s): HGBA1C in the last 72 hours. CBG: No results for input(s): GLUCAP in the last 168 hours. Lipid Profile: No results for input(s): CHOL, HDL, LDLCALC, TRIG, CHOLHDL, LDLDIRECT in the last 72 hours. Thyroid Function Tests: No results for input(s): TSH, T4TOTAL, FREET4, T3FREE, THYROIDAB in the last 72 hours. Anemia Panel: No results for input(s): VITAMINB12, FOLATE, FERRITIN, TIBC, IRON, RETICCTPCT in the last 72 hours. Sepsis Labs: No results for input(s): PROCALCITON, LATICACIDVEN in the last 168 hours.  Recent Results (from the past 240 hour(s))  Resp Panel by RT-PCR (Flu A&B, Covid) Nasopharyngeal Swab     Status: None   Collection Time: 02/08/21  9:53 AM   Specimen: Nasopharyngeal Swab; Nasopharyngeal(NP) swabs in vial transport medium  Result Value Ref Range Status   SARS Coronavirus 2 by RT PCR NEGATIVE NEGATIVE Final    Comment: (NOTE) SARS-CoV-2 target nucleic acids are NOT DETECTED.  The SARS-CoV-2 RNA is generally detectable in upper  respiratory specimens during the acute phase of infection. The lowest concentration of SARS-CoV-2 viral copies this assay can detect is 138 copies/mL. A negative result does not preclude SARS-Cov-2 infection and should not be used as the sole basis for treatment or other patient management decisions. A negative result may occur with  improper specimen collection/handling, submission of specimen other than nasopharyngeal swab, presence of viral mutation(s) within the areas targeted by this assay, and inadequate number of viral copies(<138 copies/mL). A negative result must be combined with clinical observations, patient history, and epidemiological information. The expected result is Negative.  Fact Sheet for Patients:  EntrepreneurPulse.com.au  Fact Sheet for Healthcare Providers:  IncredibleEmployment.be  This test is no t yet approved or cleared by the [place] FDA and  has been authorized for detection and/or diagnosis of SARS-CoV-2 by FDA under an Emergency Use Authorization (EUA). This EUA will remain  in effect (meaning this test can be used) for the duration of the COVID-19 declaration under Section 564(b)(1) of the Act, 21 U.S.C.section 360bbb-3(b)(1), unless the authorization is terminated  or revoked sooner.       Influenza A by PCR NEGATIVE NEGATIVE Final   Influenza B by PCR NEGATIVE NEGATIVE Final    Comment: (NOTE) The Xpert Xpress SARS-CoV-2/FLU/RSV plus assay is intended  as an aid in the diagnosis of influenza from Nasopharyngeal swab specimens and should not be used as a sole basis for treatment. Nasal washings and aspirates are unacceptable for Xpert Xpress SARS-CoV-2/FLU/RSV testing.  Fact Sheet for Patients: EntrepreneurPulse.com.au  Fact Sheet for Healthcare Providers: IncredibleEmployment.be  This test is not yet approved or cleared by the [place] FDA and has been  authorized for detection and/or diagnosis of SARS-CoV-2 by FDA under an Emergency Use Authorization (EUA). This EUA will remain in effect (meaning this test can be used) for the duration of the COVID-19 declaration under Section 564(b)(1) of the Act, 21 U.S.C. section 360bbb-3(b)(1), unless the authorization is terminated or revoked.  Performed at [facility], [address] [place], [place] [address]     Radiology Studies: CT Abdomen Pelvis W Contrast  Result Date: 02/08/2021 CLINICAL DATA:  Abdominal pain EXAM: CT ABDOMEN AND PELVIS WITH CONTRAST TECHNIQUE: Multidetector CT imaging of the abdomen and pelvis was performed using the standard protocol following bolus administration of intravenous contrast. CONTRAST:  13mL OMNIPAQUE IOHEXOL 300 MG/ML  SOLN COMPARISON:  08/24/2020 FINDINGS: Lower chest: Unremarkable. Hepatobiliary: There is fatty infiltration. There are few smooth marginated low-density lesions in the liver largest measuring 2.8 cm in diameter with no significant interval change. There is no dilation of bile ducts. Gallbladder is unremarkable. Pancreas: No focal abnormality is seen. Spleen: Spleen measures 11.7 cm in maximum diameter. Adrenals/Urinary Tract: Adrenals are unremarkable. There is no hydronephrosis. There are no renal or ureteral stones. Urinary bladder is not distended. Stomach/Bowel: There is dilation of distal jejunum and proximal ileum measuring up to 2.9 cm in diameter. Distal ileum appears to be smaller than usual in caliber. Exact level of transition is not distinctly identified. Small amount of free fluid is noted adjacent to dilated small bowel loops in the right pelvic cavity. Appendix is not distinctly seen. There is no focal pericecal inflammation. There is no significant wall thickening in colon. Few diverticula are noted in colon without signs of focal diverticulitis. There is no pericolic stranding. Vascular/Lymphatic: Unremarkable. Reproductive:  Unremarkable. Other: There is no pneumoperitoneum. There is diastasis between rectus muscles in the suprapubic region which may be residual sequela from previous surgery. Small right inguinal hernia containing fat is seen. Musculoskeletal: There is first-degree spondylolisthesis at L4-L5 level. Severe lumbar spinal stenosis is seen at L4-L5 level. There is encroachment of neural foramina at multiple levels in the lumbar spine. IMPRESSION: There is abnormal dilation of distal jejunum and proximal ileum suggesting partial small bowel obstruction. This may be related to adhesions. Exact level of transition is not clearly identified. There is decompression of distal and terminal ileum. Small amount free fluid adjacent to dilated small bowel loops may be related to partial small bowel obstruction or enteritis. There is no hydronephrosis. There are few cysts in the liver. There is fatty infiltration in the liver. Diverticulosis of colon without signs of focal acute diverticulitis. There is first-degree spondylolisthesis at L4-L5 level along with marked spinal stenosis. Electronically Signed   By: Elmer Picker M.D.   On: 02/08/2021 09:21   DG Abd Portable 2V  Result Date: 02/09/2021 CLINICAL DATA:  Crampy periumbilical pain and distension. EXAM: PORTABLE ABDOMEN - 2 VIEW COMPARISON:  CT scan February 08, 2021 FINDINGS: Lung bases are unremarkable. No free air, portal venous gas, or pneumatosis identified. No distended loops of bowel are identified on this study. There is apparent wall thickening in a small bowel loop in the right mid  abdomen, likely ileum. No other abnormalities. IMPRESSION: 1. No evidence of bowel obstruction on this study. However, the distended loops on the CT scan from yesterday were not appreciated on the scout film at that time either as they were largely fluid-filled. 2. Apparent wall thickening associated with a loop of small bowel in the right side of the abdomen, likely ileum. 3. No other  abnormalities. Electronically Signed   By: Dorise Bullion III M.D.   On: 02/09/2021 10:38    Scheduled Meds:  amLODipine  5 mg Oral Daily   buPROPion  300 mg Oral Daily   hydrochlorothiazide  25 mg Oral Daily   losartan  100 mg Oral Daily   montelukast  10 mg Oral Daily   pantoprazole  20 mg Oral Daily   pravastatin  40 mg Oral Daily   Continuous Infusions:   LOS: 0 days    Time spent: 35 mins    Malick Netz, MD [facility]   If 7PM-7AM, please contact night-coverage

## 2021-02-09 NOTE — Consult Note (Addendum)
Reason for Consult:Partial SBO Referring Physician: Breeana Sawtelle is an 71 y.o. female.  HPI: This is a 71 year old female with a history of multiple ventral hernia repairs with TAR/ mesh repair by Dr. Rosendo Gros - last in 2017.  She has a history of previous small bowel obstructions, all of which have resolved conservatively.  She presented to the ED yesterday with acute onset of crampy periumbilical pain and distention.  No nausea, vomiting.  In the ED, she had a very large bowel movement.  She is now asymptomatic with no distention or abdominal pain.    Past Medical History:  Diagnosis Date   Anemia    history in the past, not on a regular basis   Arthritis    Bronchitis    Cataracts, bilateral    immature   Claustrophobia    takes Valium daily as needed   Complication of anesthesia    2013 SURGERY vocal cord bothering pt, used smaller ent tube after 2 hernia repair, no problem after . " I woke up during my MRI and colonoscopy."        Depression    takes Wellbutrin daily   Dry eye    Dry mouth    GERD (gastroesophageal reflux disease)    takes Omeprazole daily    H/O hiatal hernia    Headache    migraines with flashing lights    History of blood clots 40 yrs    leg    History of echocardiogram 2013   a. Echo (05/2011):  Mild LVH, EF 65-70%, no WMA, Gr 1 DD, mild AI.   History of MRSA infection    History of staph infection    History of stress test 2010   a. ETT-Echo in 2010 was normal   History of TIA (transient ischemic attack) 2013   a. Carotid US (05/2011):  No ICA stenosis, pt unsure of this   Hyperlipidemia    takes Simvastatin daily    Hypertension    takes  Losartan daily   IBS (irritable bowel syndrome)    takes Bentyl daily as needed   Insomnia    takes Ambien nightly   Interstitial cystitis    Lichen planus    Mild asthma    Albuterol inhaler as needed   Palpitations    Event monitor in 2007 with rare PVCs // takes Metoprolol daily   PVC  (premature ventricular contraction)    Seasonal allergies    takes Claritin daily as needed.Takes Singulair daily as needed.   TIA (transient ischemic attack) 05/27/2011   Urethral stricture    Vertigo    takes Meclizine daily as needed    Past Surgical History:  Procedure Laterality Date   ARTHROTOMY Right 09/21/2020   Procedure: Right elbow open arthrotomy and synovectomy with radial head implant removal and repair reconstruction as necessary including ligamentous repair;  Surgeon: Roseanne Kaufman, MD;  Location: [facility];  Service: Orthopedics;  Laterality: Right;  140min   CESAREAN SECTION     COLONOSCOPY WITH PROPOFOL N/A 08/10/2012   Procedure: COLONOSCOPY WITH PROPOFOL;  Surgeon: Garlan Fair, MD;  Location: [facility];  Service: Endoscopy;  Laterality: N/A;   CYSTOSCOPY WITH URETHRAL DILATATION N/A 02/18/2013   Procedure: CYSTOSCOPY WITH URETHRAL DILATATION ( NO BALLOON) AND HYDRODISTENSION;  Surgeon: Alexis Frock, MD;  Location: [facility];  Service: Urology;  Laterality: N/A;   ENDOMETRIAL ABLATION W/ HYDROTHERMABLATOR  09-27-2002   INCISIONAL HERNIA REPAIR N/A 10/25/2014  Procedure: HERNIA REPAIR INCISIONAL;  Surgeon: Ralene Ok, MD;  Location: [place];  Service: General;  Laterality: N/A;   [place] N/A 03/26/2015   Procedure: LAPAROSCOPIC INCISIONAL HERNIA;  Surgeon: Ralene Ok, MD;  Location: [place];  Service: General;  Laterality: N/A;   INSERTION OF MESH N/A 10/25/2014   Procedure: INSERTION OF MESH;  Surgeon: Ralene Ok, MD;  Location: [place];  Service: General;  Laterality: N/A;   LAPAROSCOPIC LYSIS OF ADHESIONS N/A 03/26/2015   Procedure: LAPAROSCOPIC LYSIS OF ADHESIONS;  Surgeon: Ralene Ok, MD;  Location: [place];  Service: General;  Laterality: N/A;   LAPAROTOMY N/A 10/25/2014   Procedure: EXPLORATORY LAPAROTOMY;  Surgeon: Ralene Ok, MD;  Location: [place];  Service: General;  Laterality: N/A;   LYSIS OF ADHESION N/A  10/25/2014   Procedure: LYSIS OF ADHESIONS ;  Surgeon: Ralene Ok, MD;  Location: [place];  Service: General;  Laterality: N/A;   MUCOSAL ADVANCEMENT FLAP N/A 10/25/2014   Procedure: MUCOSAL ADVANCEMENT FLAP;  Surgeon: Ralene Ok, MD;  Location: [place];  Service: General;  Laterality: N/A;   ORIF RADIAL FRACTURE Right 06/17/2016   Procedure: right radial head arthroplasty with ligament repair, reconstruciton;  Surgeon: Roseanne Kaufman, MD;  Location: [place];  Service: Orthopedics;  Laterality: Right;   RADIAL HEAD ARTHROPLASTY Right 06/17/2016   TOOTH EXTRACTION     TRANSTHORACIC ECHOCARDIOGRAM  05-26-2011   MILD LVH/  EF 10-31%/  GRADE I DIASTOLIC DYSFUNCTION/  MILD AR//   06-20-2008  NOMRAL STRESS ECHO   UMBILICAL HERNIA REPAIR  2013   AND REVISION THE SAME YEAR W/ MESH   WISDOM TOOTH EXTRACTION  AGE 45    Family History  Problem Relation Age of Onset   Dementia Mother    Leukemia Child    Cirrhosis Father     Social History:  reports that she quit smoking about 45 years ago. Her smoking use included cigarettes. She has a 0.50 pack-year smoking history. She has never used smokeless tobacco. She reports current alcohol use. She reports that she does not use drugs.  Allergies:  Allergies  Allergen Reactions   Hydrocodone Rash   Hydrocodone-Acetaminophen Rash   Tramadol Palpitations    Medications:  Prior to Admission medications   Medication Sig Start Date End Date Taking? Authorizing Provider  acetaminophen (TYLENOL) 500 MG tablet Take 1,000 mg by mouth every 6 (six) hours as needed (pain).    Yes [provider]  albuterol (VENTOLIN HFA) 108 (90 Base) MCG/ACT inhaler Inhale 2 puffs into the lungs every 6 (six) hours as needed for wheezing or shortness of breath.   Yes [provider]  amLODipine (NORVASC) 5 MG tablet Take 5 mg by mouth daily. 05/21/20  Yes [provider]  aspirin 81 MG chewable tablet Chew 81 mg by mouth daily.   Yes [provider]  buPROPion (WELLBUTRIN XL) 300 MG 24 hr tablet Take 300 mg by mouth daily.   Yes [provider]  Cholecalciferol (VITAMIN D) 2000 units CAPS Take 2,000 Units by mouth daily.   Yes [provider]  clobetasol cream (TEMOVATE) 5.94 % Apply 1 application topically 2 (two) times daily as needed (rash).  04/01/16  Yes [provider]  Coenzyme Q10 (CO Q 10) 100 MG CAPS Take 100 mg by mouth daily.    Yes [provider]  CVS VITAMIN C 1000 MG tablet Take 2,000 mg by mouth daily. 06/13/16  Yes [provider]  Evening Primrose  Oil 1000 MG CAPS Take 1,000 mg by mouth daily.   Yes [provider]  fexofenadine (ALLEGRA) 180 MG tablet Take 180 mg by mouth daily.   Yes [provider]  Flaxseed, Linseed, 1000 MG CAPS Take 1,000 mg by mouth daily.   Yes [provider]  fluticasone (FLONASE) 50 MCG/ACT nasal spray Place 2 sprays into both nostrils daily as needed for allergies or rhinitis.   Yes [provider]  hydrochlorothiazide 25 MG tablet Take 25 mg by mouth daily.   Yes [provider]  hydrocortisone cream 1 % Apply 1 application topically 4 (four) times daily as needed for itching.   Yes [provider]  ibuprofen (ADVIL,MOTRIN) 200 MG tablet Take 400-600 mg by mouth 2 (two) times daily as needed (pain).    Yes [provider]  losartan (COZAAR) 100 MG tablet TAKE 1 TABLET BY MOUTH EVERY DAY Patient taking differently: Take 100 mg by mouth daily. 09/14/17  Yes Martinique, Peter M, MD  magnesium oxide (MAG-OX) 400 MG tablet Take 400 mg by mouth daily.   Yes [provider]  meclizine (ANTIVERT) 25 MG tablet Take 1 tablet (25 mg total) by mouth 3 (three) times daily as needed for dizziness. 05/02/20  Yes Robinson, [place] N, PA-C  metoprolol succinate (TOPROL-XL) 100 MG 24 hr tablet Take 1 tablet (100 mg total) by mouth 2 (two) times daily. Take with or immediately following a meal.  Take 100 mg by mouth 2 (two) times daily. Take with or immediately following a meal. Patient taking differently: Take 100 mg by mouth 2 (two) times daily. Take with or immediately following a meal. 07/14/18  Yes Martinique, Peter M, MD  montelukast (SINGULAIR) 10 MG tablet Take 10 mg by mouth daily.   Yes [provider]  Multiple Vitamins-Minerals (MULTIPLE VITAMINS/WOMENS PO) Take 1 tablet by mouth daily.   Yes [provider]  mupirocin ointment (BACTROBAN) 2 % Apply 1 application topically 3 (three) times daily as needed for rash. 06/20/20  Yes [provider]  pantoprazole (PROTONIX) 20 MG tablet Take 20 mg by mouth daily. 05/21/20  Yes [provider]  phenazopyridine (PYRIDIUM) 200 MG tablet Take 200 mg by mouth 3 (three) times daily as needed for pain (interstitial cystitis).   Yes [provider]  Polyethyl Glyc-Propyl Glyc PF (SYSTANE ULTRA PF) 0.4-0.3 % SOLN Place 1 drop into both eyes 4 (four) times daily as needed (Dry eye).   Yes [provider]  polyethylene glycol (MIRALAX / GLYCOLAX) 17 g packet Take 17 g by mouth 2 (two) times daily. Patient taking differently: Take 17 g by mouth daily. 11/30/19  Yes Ghimire, Dante Gang, MD  potassium chloride SA (KLOR-CON M20) 20 MEQ tablet TAKE 1&1/2 TABLETS BY MOUTH DAILY. Patient taking differently: Take 30 mEq by mouth daily. 02/16/20  Yes Martinique, Peter M, MD  pravastatin (PRAVACHOL) 40 MG tablet Take 40 mg by mouth daily. 05/21/20  Yes [provider]  Probiotic Product (ALIGN PO) Take 1 capsule by mouth 2 (two) times daily.   Yes [provider]  valACYclovir (VALTREX) 500 MG tablet INCREASE TO TAKING 1 TABLET 2 TIMES A DAY FOR 3 DAYS WITH OUTBREAK Patient taking differently: Take 500 mg by mouth daily. 03/25/16  Yes Kem Boroughs, FNP     Results for orders placed or performed during the hospital encounter of 02/08/21 (from the past 48 hour(s))  Urinalysis, Routine w reflex  microscopic Urine, Clean Catch     Status:  Abnormal   Collection Time: 02/08/21  7:37 AM  Result Value Ref Range   Color, Urine YELLOW YELLOW   APPearance CLEAR CLEAR   Specific Gravity, Urine >=1.030 1.005 - 1.030   pH 6.0 5.0 - 8.0   Glucose, UA NEGATIVE NEGATIVE mg/dL   Hgb urine dipstick NEGATIVE NEGATIVE   Bilirubin Urine NEGATIVE NEGATIVE   Ketones, ur NEGATIVE NEGATIVE mg/dL   Protein, ur NEGATIVE NEGATIVE mg/dL   Nitrite NEGATIVE NEGATIVE   Leukocytes,Ua TRACE (A) NEGATIVE    Comment: Performed at [facility], [address] [place], [place] [address]  Urinalysis, Microscopic (reflex)     Status: Abnormal   Collection Time: 02/08/21  7:37 AM  Result Value Ref Range   RBC / HPF NONE SEEN 0 - 5 RBC/hpf   WBC, UA 0-5 0 - 5 WBC/hpf   Bacteria, UA MANY (A) NONE SEEN   Squamous Epithelial / LPF 0-5 0 - 5   Ca Oxalate Crys, UA PRESENT     Comment: Performed at [facility], [address] [place], [place] [address]  Comprehensive metabolic panel     Status: Abnormal   Collection Time: 02/08/21  8:16 AM  Result Value Ref Range   Sodium 138 135 - 145 mmol/L   Potassium 4.0 3.5 - 5.1 mmol/L   Chloride 102 98 - 111 mmol/L   CO2 25 22 - 32 mmol/L   Glucose, Bld 130 (H) 70 - 99 mg/dL    Comment: Glucose reference range applies only to samples taken after fasting for at least 8 hours.   BUN 24 (H) 8 - 23 mg/dL   Creatinine, Ser 0.76 0.44 - 1.00 mg/dL   Calcium 9.4 8.9 - 10.3 mg/dL   Total Protein 6.6 6.5 - 8.1 g/dL   Albumin 4.1 3.5 - 5.0 g/dL   AST 35 15 - 41 U/L   ALT 31 0 - 44 U/L   Alkaline Phosphatase 66 38 - 126 U/L   Total Bilirubin 1.3 (H) 0.3 - 1.2 mg/dL   GFR, Estimated >60 >60 mL/min    Comment: (NOTE) Calculated using the CKD-EPI Creatinine Equation (2021)    Anion gap 11 5 - 15    Comment: Performed at [facility], [address] [place], [place] [address]  Lipase, blood     Status: None   Collection Time: 02/08/21  8:16 AM   Result Value Ref Range   Lipase 32 11 - 51 U/L    Comment: Performed at [facility], [address] [place], [place] [address]  CBC with Differential     Status: Abnormal   Collection Time: 02/08/21  8:16 AM  Result Value Ref Range   WBC 13.3 (H) 4.0 - 10.5 K/uL   RBC 4.45 3.87 - 5.11 MIL/uL   Hemoglobin 14.5 12.0 - 15.0 g/dL   HCT 41.6 36.0 - 46.0 %   MCV 93.5 80.0 - 100.0 fL   MCH 32.6 26.0 - 34.0 pg   MCHC 34.9 30.0 - 36.0 g/dL   RDW 12.1 11.5 - 15.5 %   Platelets 192 150 - 400 K/uL   nRBC 0.0 0.0 - 0.2 %   Neutrophils Relative % 83 %   Neutro Abs 11.0 (H) 1.7 - 7.7 K/uL   Lymphocytes Relative 6 %   Lymphs Abs 0.9 0.7 - 4.0 K/uL   Monocytes Relative 8 %   Monocytes Absolute 1.0 0.1 - 1.0 K/uL  Eosinophils Relative 2 %   Eosinophils Absolute 0.3 0.0 - 0.5 K/uL   Basophils Relative 1 %   Basophils Absolute 0.1 0.0 - 0.1 K/uL   Immature Granulocytes 0 %   Abs Immature Granulocytes 0.04 0.00 - 0.07 K/uL    Comment: Performed at [facility], [address] [place], [place] [address]  Resp Panel by RT-PCR (Flu A&B, Covid) Nasopharyngeal Swab     Status: None   Collection Time: 02/08/21  9:53 AM   Specimen: Nasopharyngeal Swab; Nasopharyngeal(NP) swabs in vial transport medium  Result Value Ref Range   SARS Coronavirus 2 by RT PCR NEGATIVE NEGATIVE    Comment: (NOTE) SARS-CoV-2 target nucleic acids are NOT DETECTED.  The SARS-CoV-2 RNA is generally detectable in upper respiratory specimens during the acute phase of infection. The lowest concentration of SARS-CoV-2 viral copies this assay can detect is 138 copies/mL. A negative result does not preclude SARS-Cov-2 infection and should not be used as the sole basis for treatment or other patient management decisions. A negative result may occur with  improper specimen collection/handling, submission of specimen other than nasopharyngeal swab, presence of viral mutation(s) within the areas targeted by  this assay, and inadequate number of viral copies(<138 copies/mL). A negative result must be combined with clinical observations, patient history, and epidemiological information. The expected result is Negative.  Fact Sheet for Patients:  EntrepreneurPulse.com.au  Fact Sheet for Healthcare Providers:  IncredibleEmployment.be  This test is no t yet approved or cleared by the [place] FDA and  has been authorized for detection and/or diagnosis of SARS-CoV-2 by FDA under an Emergency Use Authorization (EUA). This EUA will remain  in effect (meaning this test can be used) for the duration of the COVID-19 declaration under Section 564(b)(1) of the Act, 21 U.S.C.section 360bbb-3(b)(1), unless the authorization is terminated  or revoked sooner.       Influenza A by PCR NEGATIVE NEGATIVE   Influenza B by PCR NEGATIVE NEGATIVE    Comment: (NOTE) The Xpert Xpress SARS-CoV-2/FLU/RSV plus assay is intended as an aid in the diagnosis of influenza from Nasopharyngeal swab specimens and should not be used as a sole basis for treatment. Nasal washings and aspirates are unacceptable for Xpert Xpress SARS-CoV-2/FLU/RSV testing.  Fact Sheet for Patients: EntrepreneurPulse.com.au  Fact Sheet for Healthcare Providers: IncredibleEmployment.be  This test is not yet approved or cleared by the [place] FDA and has been authorized for detection and/or diagnosis of SARS-CoV-2 by FDA under an Emergency Use Authorization (EUA). This EUA will remain in effect (meaning this test can be used) for the duration of the COVID-19 declaration under Section 564(b)(1) of the Act, 21 U.S.C. section 360bbb-3(b)(1), unless the authorization is terminated or revoked.  Performed at [facility], [address] [place], [place] [address]   CBC     Status: None   Collection Time: 02/09/21  4:45 AM  Result Value Ref Range    WBC 7.1 4.0 - 10.5 K/uL   RBC 4.05 3.87 - 5.11 MIL/uL   Hemoglobin 13.4 12.0 - 15.0 g/dL   HCT 39.0 36.0 - 46.0 %   MCV 96.3 80.0 - 100.0 fL   MCH 33.1 26.0 - 34.0 pg   MCHC 34.4 30.0 - 36.0 g/dL   RDW 12.5 11.5 - 15.5 %   Platelets 178 150 - 400 K/uL   nRBC 0.0 0.0 - 0.2 %    Comment: Performed at [facility], [address] [place], [place] [address]  Basic metabolic panel     Status: None   Collection Time: 02/09/21  4:45 AM  Result Value Ref Range   Sodium 139 135 - 145 mmol/L   Potassium 3.5 3.5 - 5.1 mmol/L   Chloride 101 98 - 111 mmol/L   CO2 28 22 - 32 mmol/L   Glucose, Bld 89 70 - 99 mg/dL    Comment: Glucose reference range applies only to samples taken after fasting for at least 8 hours.   BUN 15 8 - 23 mg/dL   Creatinine, Ser 0.67 0.44 - 1.00 mg/dL   Calcium 8.9 8.9 - 10.3 mg/dL   GFR, Estimated >60 >60 mL/min    Comment: (NOTE) Calculated using the CKD-EPI Creatinine Equation (2021)    Anion gap 10 5 - 15    Comment: Performed at [facility], [place] [address], [place], [place] [address]    CT Abdomen Pelvis W Contrast  Result Date: 02/08/2021 CLINICAL DATA:  Abdominal pain EXAM: CT ABDOMEN AND PELVIS WITH CONTRAST TECHNIQUE: Multidetector CT imaging of the abdomen and pelvis was performed using the standard protocol following bolus administration of intravenous contrast. CONTRAST:  154mL OMNIPAQUE IOHEXOL 300 MG/ML  SOLN COMPARISON:  08/24/2020 FINDINGS: Lower chest: Unremarkable. Hepatobiliary: There is fatty infiltration. There are few smooth marginated low-density lesions in the liver largest measuring 2.8 cm in diameter with no significant interval change. There is no dilation of bile ducts. Gallbladder is unremarkable. Pancreas: No focal abnormality is seen. Spleen: Spleen measures 11.7 cm in maximum diameter. Adrenals/Urinary Tract: Adrenals are unremarkable. There is no hydronephrosis. There are no renal or ureteral stones.  Urinary bladder is not distended. Stomach/Bowel: There is dilation of distal jejunum and proximal ileum measuring up to 2.9 cm in diameter. Distal ileum appears to be smaller than usual in caliber. Exact level of transition is not distinctly identified. Small amount of free fluid is noted adjacent to dilated small bowel loops in the right pelvic cavity. Appendix is not distinctly seen. There is no focal pericecal inflammation. There is no significant wall thickening in colon. Few diverticula are noted in colon without signs of focal diverticulitis. There is no pericolic stranding. Vascular/Lymphatic: Unremarkable. Reproductive: Unremarkable. Other: There is no pneumoperitoneum. There is diastasis between rectus muscles in the suprapubic region which may be residual sequela from previous surgery. Small right inguinal hernia containing fat is seen. Musculoskeletal: There is first-degree spondylolisthesis at L4-L5 level. Severe lumbar spinal stenosis is seen at L4-L5 level. There is encroachment of neural foramina at multiple levels in the lumbar spine. IMPRESSION: There is abnormal dilation of distal jejunum and proximal ileum suggesting partial small bowel obstruction. This may be related to adhesions. Exact level of transition is not clearly identified. There is decompression of distal and terminal ileum. Small amount free fluid adjacent to dilated small bowel loops may be related to partial small bowel obstruction or enteritis. There is no hydronephrosis. There are few cysts in the liver. There is fatty infiltration in the liver. Diverticulosis of colon without signs of focal acute diverticulitis. There is first-degree spondylolisthesis at L4-L5 level along with marked spinal stenosis. Electronically Signed   By: Elmer Picker M.D.   On: 02/08/2021 09:21    Review of Systems  HENT:  Negative for ear discharge, ear pain, hearing loss and tinnitus.   Eyes:  Negative for photophobia and pain.  Respiratory:   Negative for cough and shortness of breath.   Cardiovascular:  Negative for chest  pain.  Gastrointestinal:  Positive for abdominal distention and abdominal pain. Negative for nausea and vomiting.  Genitourinary:  Negative for dysuria, flank pain, frequency and urgency.  Musculoskeletal:  Negative for back pain, myalgias and neck pain.  Neurological:  Negative for dizziness and headaches.  Hematological:  Does not bruise/bleed easily.  Psychiatric/Behavioral:  The patient is not nervous/anxious.    Blood pressure (!) 120/51, pulse 63, temperature 98.4 F (36.9 C), temperature source Oral, resp. rate 18, height 5\' 4"  (1.626 m), weight 72.6 kg, last menstrual period 02/04/2004, SpO2 96 %. Physical Exam Constitutional:  WDWN in NAD, conversant, no obvious deformities; lying in bed comfortably Eyes:  Pupils equal, round; sclera anicteric; moist conjunctiva; no lid lag HENT:  Oral mucosa moist; good dentition  Neck:  No masses palpated, trachea midline; no thyromegaly Lungs:  CTA bilaterally; normal respiratory effort CV:  Regular rate and rhythm; no murmurs; extremities well-perfused with no edema Abd:  +bowel sounds, soft, non-tender, lax but no distention; healed incisions with no palpable hernias Musc:  Unable to assess gait; no apparent clubbing or cyanosis in extremities Lymphatic:  No palpable cervical or axillary lymphadenopathy Skin:  Warm, dry; no sign of jaundice Psychiatric - alert and oriented x 4; calm mood and affect  Assessment/Plan: Partial small bowel obstruction - clinically resolved Follow-up plain film today Clear liquids - advance as tolerated If she is able to advance to soft diet by this evening, probable discharge tomorrow.  Imogene Burn Dalbert Stillings 02/09/2021, 9:03 AM    MDM - moderate

## 2021-02-09 NOTE — Plan of Care (Signed)
Patient tolerating FLD without difficulty. No GI symptoms at this time.   Problem: Education: Goal: Knowledge of General Education information will improve Description: Including pain rating scale, medication(s)/side effects and non-pharmacologic comfort measures Outcome: Progressing   Problem: Health Behavior/Discharge Planning: Goal: Ability to manage health-related needs will improve Outcome: Progressing   Problem: Clinical Measurements: Goal: Ability to maintain clinical measurements within normal limits will improve Outcome: Progressing Goal: Will remain free from infection Outcome: Progressing Goal: Diagnostic test results will improve Outcome: Progressing Goal: Respiratory complications will improve Outcome: Progressing Goal: Cardiovascular complication will be avoided Outcome: Progressing   Problem: Activity: Goal: Risk for activity intolerance will decrease Outcome: Progressing   Problem: Nutrition: Goal: Adequate nutrition will be maintained Outcome: Progressing   Problem: Coping: Goal: Level of anxiety will decrease Outcome: Progressing   Problem: Elimination: Goal: Will not experience complications related to bowel motility Outcome: Progressing Goal: Will not experience complications related to urinary retention Outcome: Progressing   Problem: Pain Managment: Goal: General experience of comfort will improve Outcome: Progressing   Problem: Safety: Goal: Ability to remain free from injury will improve Outcome: Progressing   Problem: Skin Integrity: Goal: Risk for impaired skin integrity will decrease Outcome: Progressing

## 2021-02-10 DIAGNOSIS — K56609 Unspecified intestinal obstruction, unspecified as to partial versus complete obstruction: Secondary | ICD-10-CM | POA: Diagnosis not present

## 2021-02-10 NOTE — Progress Notes (Signed)
° °  Subjective/Chief Complaint: Passing flatus, small BM Tolerating diet  Plain films - no obstruction Objective: Vital signs in last 24 hours: Temp:  [98.1 F (36.7 C)-98.3 F (36.8 C)] 98.1 F (36.7 C) (01/08 0432) Pulse Rate:  [59-94] 94 (01/08 0432) Resp:  [18] 18 (01/08 0432) BP: (119-137)/(51-73) 122/60 (01/08 0854) SpO2:  [95 %-100 %] 100 % (01/08 0432) Last BM Date: 02/08/21  Intake/Output from previous day: 01/07 0701 - 01/08 0700 In: 480 [P.O.:480] Out: -  Intake/Output this shift: No intake/output data recorded.  General appearance: alert, cooperative, and no distress GI: soft, no palpable hernias; mild chronic discomfort but no distention or acute abdominal pain  Lab Results:  Recent Labs    02/08/21 0816 02/09/21 0445  WBC 13.3* 7.1  HGB 14.5 13.4  HCT 41.6 39.0  PLT 192 178   BMET Recent Labs    02/08/21 0816 02/09/21 0445  NA 138 139  K 4.0 3.5  CL 102 101  CO2 25 28  GLUCOSE 130* 89  BUN 24* 15  CREATININE 0.76 0.67  CALCIUM 9.4 8.9   PT/INR No results for input(s): LABPROT, INR in the last 72 hours. ABG No results for input(s): PHART, HCO3 in the last 72 hours.  Invalid input(s): PCO2, PO2  Studies/Results: DG Abd Portable 2V  Result Date: 02/09/2021 CLINICAL DATA:  Crampy periumbilical pain and distension. EXAM: PORTABLE ABDOMEN - 2 VIEW COMPARISON:  CT scan February 08, 2021 FINDINGS: Lung bases are unremarkable. No free air, portal venous gas, or pneumatosis identified. No distended loops of bowel are identified on this study. There is apparent wall thickening in a small bowel loop in the right mid abdomen, likely ileum. No other abnormalities. IMPRESSION: 1. No evidence of bowel obstruction on this study. However, the distended loops on the CT scan from yesterday were not appreciated on the scout film at that time either as they were largely fluid-filled. 2. Apparent wall thickening associated with a loop of small bowel in the right  side of the abdomen, likely ileum. 3. No other abnormalities. Electronically Signed   By: Dorise Bullion III M.D.   On: 02/09/2021 10:38    Anti-infectives: Anti-infectives (From admission, onward)    None       Assessment/Plan: Partial small bowel obstruction - resolved Soft diet Discharge per primary team - call us if needed Follow-up with Dr. Rosendo Gros as needed after discharge.   LOS: 0 days    Cynthia Peters 02/10/2021

## 2021-02-10 NOTE — Discharge Instructions (Addendum)
Advised to follow-up with primary care physician in 1 week. Advised to follow-up with general surgery Dr. Rosendo Gros in 1 week. Small bowel obstruction has resolved

## 2021-02-10 NOTE — Progress Notes (Signed)
Pt called back and t had question regarding her medications. Explained next dose due again. Teach back method used.

## 2021-02-10 NOTE — Progress Notes (Signed)
Pt son is here at pt is eating. She is tolerating food without complaints of N/V/d.  D/C instructions give. Continue with plan of care. D/C instruction given

## 2021-02-10 NOTE — Discharge Summary (Signed)
Physician Discharge Summary  Cynthia Peters ZTI:[id number] DOB: 1950-05-30 DOA: 02/08/2021  PCP: Aletha Halim., PA-C  Admit date: 02/08/2021  Discharge date: 02/10/2021  Admitted From: Home.  Disposition:  Home.  Recommendations for Outpatient Follow-up:  Follow up with PCP in 1-2 weeks. Please obtain BMP/CBC in one week Advised to follow-up with General surgery Dr. Rosendo Gros in 1 week. Small bowel obstruction has resolved.  Home Health: None Equipment/Devices:None  Discharge Condition: Stable CODE STATUS:Full code Diet recommendation: Heart Healthy   Brief [facility] Course: This 71 years old female with PMH significant for multiple abdominal surgeries, multiple small bowel obstruction hospitalizations, GERD, IBS, hyperlipidemia, hypertension, anxiety, depression, TIA, DVT, asthma presenting in the ED with complaints of abdominal pain associated with nausea and vomiting.  CT abdomen shows partial small bowel obstruction,  may be related to adhesions although exact level of transition not clearly identified. Patient was admitted for partial small bowel obstruction. She was kept NPO. started on IV hydration. Patient refused NG tube placement since she is not feeling nauseous.  She had a big bowel movement while in the ED.  General surgery consulted.  Patient is started on clear liquid diet , she tolerated well, diet advanced to full liquid to soft bland diet she tolerated well.  Cleared from general surgery to be discharged.  Patient being discharged home.  She was managed for below problems.  Discharge Diagnoses:  Principal Problem:   SBO (small bowel obstruction) (HCC) Active Problems:   Hypertension   Anxiety   GERD (gastroesophageal reflux disease)   Asymptomatic bacteriuria  Partial small bowel obstruction: Patient presented with abdominal pain, nausea and vomiting. CT abdomen showed partial small bowel obstruction, may be related to adhesions although exact  level of transition is not clearly identified. Patient does reports history of recurrent multiple abdominal surgeries and multiple SBO hospitalizations. Patient refused NG tube, Continue n.p.o. IV fluids. GI consulted,  Patient had a good bowel movement. Started on clear liquid diet,  Continue IV hydration, Patient tolerated soft bland diet,  Patient being discharged home   Leukocytosis: > Resolved. Could be reactive secondary to SBO. Continue to monitor   Asymptomatic bacteriuria: UA shows trace leukocytes.   Patient denies any UTI symptoms.   Asthma: Continue home inhalers.   Hypertension: Continue amlodipine, losartan, hydrochlorothiazide. Hold Toprol because of bradycardia   GERD: Continue pantoprazole   Hyperlipidemia: Continue pravastatin  Discharge Instructions  Discharge Instructions     Call MD for:  difficulty breathing, headache or visual disturbances   Complete by: As directed    Call MD for:  persistant dizziness or light-headedness   Complete by: As directed    Call MD for:  persistant nausea and vomiting   Complete by: As directed    Diet - low sodium heart healthy   Complete by: As directed    Diet general   Complete by: As directed    Discharge instructions   Complete by: As directed    Advised to follow-up with primary care physician in 1 week. Advised to follow-up with general surgery Dr. Rosendo Gros in 1 week. Small bowel obstruction has resolved   Increase activity slowly   Complete by: As directed       Allergies as of 02/10/2021       Reactions   Hydrocodone Rash   Hydrocodone-acetaminophen Rash   Tramadol Palpitations        Medication List     STOP taking these medications    ibuprofen 200 MG  tablet Commonly known as: ADVIL   phenazopyridine 200 MG tablet Commonly known as: PYRIDIUM       TAKE these medications    acetaminophen 500 MG tablet Commonly known as: TYLENOL Take 1,000 mg by mouth every 6 (six) hours as  needed (pain).   albuterol 108 (90 Base) MCG/ACT inhaler Commonly known as: VENTOLIN HFA Inhale 2 puffs into the lungs every 6 (six) hours as needed for wheezing or shortness of breath.   ALIGN PO Take 1 capsule by mouth 2 (two) times daily.   amLODipine 5 MG tablet Commonly known as: NORVASC Take 5 mg by mouth daily.   aspirin 81 MG chewable tablet Chew 81 mg by mouth daily.   buPROPion 300 MG 24 hr tablet Commonly known as: WELLBUTRIN XL Take 300 mg by mouth daily.   clobetasol cream 0.05 % Commonly known as: TEMOVATE Apply 1 application topically 2 (two) times daily as needed (rash).   Co Q 10 100 MG Caps Take 100 mg by mouth daily.   [employer] Vitamin C 1000 MG tablet Generic drug: ascorbic acid Take 2,000 mg by mouth daily.   Evening Primrose Oil 1000 MG Caps Take 1,000 mg by mouth daily.   fexofenadine 180 MG tablet Commonly known as: ALLEGRA Take 180 mg by mouth daily.   Flaxseed (Linseed) 1000 MG Caps Take 1,000 mg by mouth daily.   fluticasone 50 MCG/ACT nasal spray Commonly known as: FLONASE Place 2 sprays into both nostrils daily as needed for allergies or rhinitis.   hydrochlorothiazide 25 MG tablet Commonly known as: HYDRODIURIL Take 25 mg by mouth daily.   hydrocortisone cream 1 % Apply 1 application topically 4 (four) times daily as needed for itching.   losartan 100 MG tablet Commonly known as: COZAAR TAKE 1 TABLET BY MOUTH EVERY DAY   magnesium oxide 400 MG tablet Commonly known as: MAG-OX Take 400 mg by mouth daily.   meclizine 25 MG tablet Commonly known as: ANTIVERT Take 1 tablet (25 mg total) by mouth 3 (three) times daily as needed for dizziness.   metoprolol succinate 100 MG 24 hr tablet Commonly known as: TOPROL-XL Take 1 tablet (100 mg total) by mouth 2 (two) times daily. Take with or immediately following a meal. Take 100 mg by mouth 2 (two) times daily. Take with or immediately following a meal. What changed: additional  instructions   montelukast 10 MG tablet Commonly known as: SINGULAIR Take 10 mg by mouth daily.   MULTIPLE VITAMINS/WOMENS PO Take 1 tablet by mouth daily.   mupirocin ointment 2 % Commonly known as: BACTROBAN Apply 1 application topically 3 (three) times daily as needed for rash.   pantoprazole 20 MG tablet Commonly known as: PROTONIX Take 20 mg by mouth daily.   polyethylene glycol 17 g packet Commonly known as: MIRALAX / GLYCOLAX Take 17 g by mouth 2 (two) times daily. What changed: when to take this   potassium chloride SA 20 MEQ tablet Commonly known as: Klor-Con M20 TAKE 1&1/2 TABLETS BY MOUTH DAILY. What changed:  how much to take how to take this when to take this additional instructions   pravastatin 40 MG tablet Commonly known as: PRAVACHOL Take 40 mg by mouth daily.   Systane Ultra PF 0.4-0.3 % Soln Generic drug: Polyethyl Glyc-Propyl Glyc PF Place 1 drop into both eyes 4 (four) times daily as needed (Dry eye).   valACYclovir 500 MG tablet Commonly known as: VALTREX INCREASE TO TAKING 1 TABLET 2 TIMES A DAY FOR  3 DAYS WITH OUTBREAK What changed: See the new instructions.   Vitamin D 50 MCG (2000 UT) Caps Take 2,000 Units by mouth daily.        Follow-up Information     Aletha Halim., PA-C Follow up in 1 week(s).   Specialty: Family Medicine Contact information: [address] [place] [address] [phone]         Wilford Corner, MD .   Specialty: Gastroenterology Contact information: [phone] [place] [address] [phone]         [place], Peter M, MD .   Specialty: Cardiology Contact information: [address] [place] [address] [phone]         Ralene Ok, MD Follow up in 1 week(s).   Specialty: General Surgery Contact information: [address] [place] [address] [phone]                Allergies  Allergen Reactions    Hydrocodone Rash   Hydrocodone-Acetaminophen Rash   Tramadol Palpitations    Consultations: General Surgery   Procedures/Studies: CT Abdomen Pelvis W Contrast  Result Date: 02/08/2021 CLINICAL DATA:  Abdominal pain EXAM: CT ABDOMEN AND PELVIS WITH CONTRAST TECHNIQUE: Multidetector CT imaging of the abdomen and pelvis was performed using the standard protocol following bolus administration of intravenous contrast. CONTRAST:  166mL OMNIPAQUE IOHEXOL 300 MG/ML  SOLN COMPARISON:  08/24/2020 FINDINGS: Lower chest: Unremarkable. Hepatobiliary: There is fatty infiltration. There are few smooth marginated low-density lesions in the liver largest measuring 2.8 cm in diameter with no significant interval change. There is no dilation of bile ducts. Gallbladder is unremarkable. Pancreas: No focal abnormality is seen. Spleen: Spleen measures 11.7 cm in maximum diameter. Adrenals/Urinary Tract: Adrenals are unremarkable. There is no hydronephrosis. There are no renal or ureteral stones. Urinary bladder is not distended. Stomach/Bowel: There is dilation of distal jejunum and proximal ileum measuring up to 2.9 cm in diameter. Distal ileum appears to be smaller than usual in caliber. Exact level of transition is not distinctly identified. Small amount of free fluid is noted adjacent to dilated small bowel loops in the right pelvic cavity. Appendix is not distinctly seen. There is no focal pericecal inflammation. There is no significant wall thickening in colon. Few diverticula are noted in colon without signs of focal diverticulitis. There is no pericolic stranding. Vascular/Lymphatic: Unremarkable. Reproductive: Unremarkable. Other: There is no pneumoperitoneum. There is diastasis between rectus muscles in the suprapubic region which may be residual sequela from previous surgery. Small right inguinal hernia containing fat is seen. Musculoskeletal: There is first-degree spondylolisthesis at L4-L5 level. Severe lumbar  spinal stenosis is seen at L4-L5 level. There is encroachment of neural foramina at multiple levels in the lumbar spine. IMPRESSION: There is abnormal dilation of distal jejunum and proximal ileum suggesting partial small bowel obstruction. This may be related to adhesions. Exact level of transition is not clearly identified. There is decompression of distal and terminal ileum. Small amount free fluid adjacent to dilated small bowel loops may be related to partial small bowel obstruction or enteritis. There is no hydronephrosis. There are few cysts in the liver. There is fatty infiltration in the liver. Diverticulosis of colon without signs of focal acute diverticulitis. There is first-degree spondylolisthesis at L4-L5 level along with marked spinal stenosis. Electronically Signed   By: Elmer Picker M.D.   On: 02/08/2021 09:21   DG Abd Portable 2V  Result Date: 02/09/2021 CLINICAL DATA:  Crampy  periumbilical pain and distension. EXAM: PORTABLE ABDOMEN - 2 VIEW COMPARISON:  CT scan February 08, 2021 FINDINGS: Lung bases are unremarkable. No free air, portal venous gas, or pneumatosis identified. No distended loops of bowel are identified on this study. There is apparent wall thickening in a small bowel loop in the right mid abdomen, likely ileum. No other abnormalities. IMPRESSION: 1. No evidence of bowel obstruction on this study. However, the distended loops on the CT scan from yesterday were not appreciated on the scout film at that time either as they were largely fluid-filled. 2. Apparent wall thickening associated with a loop of small bowel in the right side of the abdomen, likely ileum. 3. No other abnormalities. Electronically Signed   By: Dorise Bullion III M.D.   On: 02/09/2021 10:38     Subjective: Seen and examined at bedside.  Overnight events noted.   Patient had a bowel movement. Patient is able to pass flatus. Patient ambulated and patient is being discharged home.  Discharge  Exam: Vitals:   02/10/21 0854 02/10/21 1218  BP: 122/60 123/75  Pulse:  92  Resp:  20  Temp:  98.2 F (36.8 C)  SpO2:  96%   Vitals:   02/09/21 2001 02/10/21 0432 02/10/21 0854 02/10/21 1218  BP: 119/73 137/62 122/60 123/75  Pulse: 83 94  92  Resp:  18  20  Temp: 98.2 F (36.8 C) 98.1 F (36.7 C)  98.2 F (36.8 C)  TempSrc: Oral Oral  Oral  SpO2: 97% 100%  96%  Weight:      Height:        General: Pt is alert, awake, not in acute distress Cardiovascular: RRR, S1/S2 +, no rubs, no gallops Respiratory: CTA bilaterally, no wheezing, no rhonchi Abdominal: Soft, NT, ND, bowel sounds + Extremities: no edema, no cyanosis    The results of significant diagnostics from this hospitalization (including imaging, microbiology, ancillary and laboratory) are listed below for reference.     Microbiology: Recent Results (from the past 240 hour(s))  Resp Panel by RT-PCR (Flu A&B, Covid) Nasopharyngeal Swab     Status: None   Collection Time: 02/08/21  9:53 AM   Specimen: Nasopharyngeal Swab; Nasopharyngeal(NP) swabs in vial transport medium  Result Value Ref Range Status   SARS Coronavirus 2 by RT PCR NEGATIVE NEGATIVE Final    Comment: (NOTE) SARS-CoV-2 target nucleic acids are NOT DETECTED.  The SARS-CoV-2 RNA is generally detectable in upper respiratory specimens during the acute phase of infection. The lowest concentration of SARS-CoV-2 viral copies this assay can detect is 138 copies/mL. A negative result does not preclude SARS-Cov-2 infection and should not be used as the sole basis for treatment or other patient management decisions. A negative result may occur with  improper specimen collection/handling, submission of specimen other than nasopharyngeal swab, presence of viral mutation(s) within the areas targeted by this assay, and inadequate number of viral copies(<138 copies/mL). A negative result must be combined with clinical observations, patient history, and  epidemiological information. The expected result is Negative.  Fact Sheet for Patients:  EntrepreneurPulse.com.au  Fact Sheet for Healthcare Providers:  IncredibleEmployment.be  This test is no t yet approved or cleared by the [place] FDA and  has been authorized for detection and/or diagnosis of SARS-CoV-2 by FDA under an Emergency Use Authorization (EUA). This EUA will remain  in effect (meaning this test can be used) for the duration of the COVID-19 declaration under Section 564(b)(1) of the Act, 21 U.S.C.section 360bbb-3(b)(1),  unless the authorization is terminated  or revoked sooner.       Influenza A by PCR NEGATIVE NEGATIVE Final   Influenza B by PCR NEGATIVE NEGATIVE Final    Comment: (NOTE) The Xpert Xpress SARS-CoV-2/FLU/RSV plus assay is intended as an aid in the diagnosis of influenza from Nasopharyngeal swab specimens and should not be used as a sole basis for treatment. Nasal washings and aspirates are unacceptable for Xpert Xpress SARS-CoV-2/FLU/RSV testing.  Fact Sheet for Patients: EntrepreneurPulse.com.au  Fact Sheet for Healthcare Providers: IncredibleEmployment.be  This test is not yet approved or cleared by the [place] FDA and has been authorized for detection and/or diagnosis of SARS-CoV-2 by FDA under an Emergency Use Authorization (EUA). This EUA will remain in effect (meaning this test can be used) for the duration of the COVID-19 declaration under Section 564(b)(1) of the Act, 21 U.S.C. section 360bbb-3(b)(1), unless the authorization is terminated or revoked.  Performed at [facility], [address] [place], [place] [address]      Labs: BNP (last 3 results) No results for input(s): BNP in the last 8760 hours. Basic Metabolic Panel: Recent Labs  Lab 02/08/21 0816 02/09/21 0445  NA 138 139  K 4.0 3.5  CL 102 101  CO2 25 28  GLUCOSE 130*  89  BUN 24* 15  CREATININE 0.76 0.67  CALCIUM 9.4 8.9   Liver Function Tests: Recent Labs  Lab 02/08/21 0816  AST 35  ALT 31  ALKPHOS 66  BILITOT 1.3*  PROT 6.6  ALBUMIN 4.1   Recent Labs  Lab 02/08/21 0816  LIPASE 32   No results for input(s): AMMONIA in the last 168 hours. CBC: Recent Labs  Lab 02/08/21 0816 02/09/21 0445  WBC 13.3* 7.1  NEUTROABS 11.0*  --   HGB 14.5 13.4  HCT 41.6 39.0  MCV 93.5 96.3  PLT 192 178   Cardiac Enzymes: No results for input(s): CKTOTAL, CKMB, CKMBINDEX, TROPONINI in the last 168 hours. BNP: Invalid input(s): POCBNP CBG: No results for input(s): GLUCAP in the last 168 hours. D-Dimer No results for input(s): DDIMER in the last 72 hours. Hgb A1c No results for input(s): HGBA1C in the last 72 hours. Lipid Profile No results for input(s): CHOL, HDL, LDLCALC, TRIG, CHOLHDL, LDLDIRECT in the last 72 hours. Thyroid function studies No results for input(s): TSH, T4TOTAL, T3FREE, THYROIDAB in the last 72 hours.  Invalid input(s): FREET3 Anemia work up No results for input(s): VITAMINB12, FOLATE, FERRITIN, TIBC, IRON, RETICCTPCT in the last 72 hours. Urinalysis    Component Value Date/Time   COLORURINE YELLOW 02/08/2021 [place] 02/08/2021 0737   LABSPEC >=1.030 02/08/2021 0737   PHURINE 6.0 02/08/2021 0737   GLUCOSEU NEGATIVE 02/08/2021 0737   HGBUR NEGATIVE 02/08/2021 0737   BILIRUBINUR NEGATIVE 02/08/2021 0737   BILIRUBINUR n 10/20/2014 [place] 02/08/2021 0737   PROTEINUR NEGATIVE 02/08/2021 0737   UROBILINOGEN negative 10/20/2014 0949   UROBILINOGEN 0.2 09/09/2014 0942   NITRITE NEGATIVE 02/08/2021 0737   LEUKOCYTESUR TRACE (A) 02/08/2021 0737   Sepsis Labs Invalid input(s): PROCALCITONIN,  WBC,  LACTICIDVEN Microbiology Recent Results (from the past 240 hour(s))  Resp Panel by RT-PCR (Flu A&B, Covid) Nasopharyngeal Swab     Status: None   Collection Time: 02/08/21  9:53 AM    Specimen: Nasopharyngeal Swab; Nasopharyngeal(NP) swabs in vial transport medium  Result Value Ref Range Status   SARS Coronavirus 2 by RT PCR NEGATIVE NEGATIVE Final  Comment: (NOTE) SARS-CoV-2 target nucleic acids are NOT DETECTED.  The SARS-CoV-2 RNA is generally detectable in upper respiratory specimens during the acute phase of infection. The lowest concentration of SARS-CoV-2 viral copies this assay can detect is 138 copies/mL. A negative result does not preclude SARS-Cov-2 infection and should not be used as the sole basis for treatment or other patient management decisions. A negative result may occur with  improper specimen collection/handling, submission of specimen other than nasopharyngeal swab, presence of viral mutation(s) within the areas targeted by this assay, and inadequate number of viral copies(<138 copies/mL). A negative result must be combined with clinical observations, patient history, and epidemiological information. The expected result is Negative.  Fact Sheet for Patients:  EntrepreneurPulse.com.au  Fact Sheet for Healthcare Providers:  IncredibleEmployment.be  This test is no t yet approved or cleared by the [place] FDA and  has been authorized for detection and/or diagnosis of SARS-CoV-2 by FDA under an Emergency Use Authorization (EUA). This EUA will remain  in effect (meaning this test can be used) for the duration of the COVID-19 declaration under Section 564(b)(1) of the Act, 21 U.S.C.section 360bbb-3(b)(1), unless the authorization is terminated  or revoked sooner.       Influenza A by PCR NEGATIVE NEGATIVE Final   Influenza B by PCR NEGATIVE NEGATIVE Final    Comment: (NOTE) The Xpert Xpress SARS-CoV-2/FLU/RSV plus assay is intended as an aid in the diagnosis of influenza from Nasopharyngeal swab specimens and should not be used as a sole basis for treatment. Nasal washings and aspirates are  unacceptable for Xpert Xpress SARS-CoV-2/FLU/RSV testing.  Fact Sheet for Patients: EntrepreneurPulse.com.au  Fact Sheet for Healthcare Providers: IncredibleEmployment.be  This test is not yet approved or cleared by the [place] FDA and has been authorized for detection and/or diagnosis of SARS-CoV-2 by FDA under an Emergency Use Authorization (EUA). This EUA will remain in effect (meaning this test can be used) for the duration of the COVID-19 declaration under Section 564(b)(1) of the Act, 21 U.S.C. section 360bbb-3(b)(1), unless the authorization is terminated or revoked.  Performed at [facility], [address] [place], [place] [address]      Time coordinating discharge: Over 30 minutes  SIGNED:   Shawna Clamp, MD  [facility] 02/10/2021, 3:17 PM Pager   If 7PM-7AM, please contact night-coverage

## 2021-02-10 NOTE — TOC CM/SW Note (Signed)
°  Transition of Care (TOC) Screening Note   Patient Details  Name: RASHIYA LOFLAND Date of Birth: [birth year]   Transition of Care [facility] CM/SW Contact:    Ross Ludwig, LCSW Phone Number: 02/10/2021, 4:22 PM    Transition of Care Department [facility]) has reviewed patient and no TOC needs have been identified at this time. We will continue to monitor patient advancement through interdisciplinary progression rounds. If new patient transition needs arise, please place a TOC consult.

## 2021-02-10 NOTE — Progress Notes (Signed)
D/c medication discussed with patient. AVS updated with next dose due at bedside. Spoke with MD order was written for pt to increase diet from full liquids to sift diet. Spoke with MD about pt c/o abd tenderness. MD stated this is due to multiple surgeries and her history. Will notify provider if pt has issues with diet increase. Continue with plan of care

## 2021-03-06 NOTE — Telephone Encounter (Signed)
Patient is returning RN's call in regards to [employer]. Best callback number to discuss further is [phone].

## 2021-04-15 NOTE — Progress Notes (Unsigned)
Cardiology Office Note:    Date:  04/15/2021   ID:  Cynthia Peters, DOB 08-Nov-1950, MRN [id number]  PCP:  Aletha Halim., PA-C  Cardiologist:  Margurette Brener Martinique, MD  Electrophysiologist:  None   Referring MD: Aletha Halim., PA-C   Chief Complaint: routine follow-up of palpitations or hypertension  History of Present Illness:    Cynthia Peters is a 71 y.o. female with a history of palpitations with PVCs noted on monitor in 2017, thoracic aortic aneurysm, hypertension, hyperlipidemia, TIA in 2013, GERD, IBS, and vertigo who is seen today for routine follow-up.  Patient has a history of palpitations. Monitor in 2017 showed sinus rhythm with rare PVCs. Palpitations ultimately improved with increase in beta-blocker. Patient was last seen by Roby Lofts, PA-C, for a virtual visit in 11/2019 at which time she was doing well from a cardiac standpoint. Echo was ordered for routine monitoring of thoracic aorta and showed LVEF of 60-65% with normal wall motion, grade 1 diastolic dysfunction, mild AI, and mild dilatation of ascending aorta measuring 58m (stable from prior Echo in 2018).  She was recently seen in the ED on 05/03/2020 for dizziness similar to episodes of vertigo in the past. Improved with Meclizine.  When seen last year she complained of chest pain. Coronary CTA was done showing minimal nonobstructive CAD. Ascending aorta was stable at 4.0 cm.    Past Medical History:  Diagnosis Date   Anemia    history in the past, not on a regular basis   Arthritis    Bronchitis    Cataracts, bilateral    immature   Claustrophobia    takes Valium daily as needed   Complication of anesthesia    2013 SURGERY vocal cord bothering pt, used smaller ent tube after 2 hernia repair, no problem after . " I woke up during my MRI and colonoscopy."        Depression    takes Wellbutrin daily   Dry eye    Dry mouth    GERD (gastroesophageal reflux disease)    takes Omeprazole daily     H/O hiatal hernia    Headache    migraines with flashing lights    History of blood clots 40 yrs    leg    History of echocardiogram 2013   a. Echo (05/2011):  Mild LVH, EF 65-70%, no WMA, Gr 1 DD, mild AI.   History of MRSA infection    History of staph infection    History of stress test 2010   a. ETT-Echo in 2010 was normal   History of TIA (transient ischemic attack) 2013   a. Carotid [place](05/2011):  No ICA stenosis, pt unsure of this   Hyperlipidemia    takes Simvastatin daily    Hypertension    takes  Losartan daily   IBS (irritable bowel syndrome)    takes Bentyl daily as needed   Insomnia    takes Ambien nightly   Interstitial cystitis    Lichen planus    Mild asthma    Albuterol inhaler as needed   Palpitations    Event monitor in 2007 with rare PVCs // takes Metoprolol daily   PVC (premature ventricular contraction)    Seasonal allergies    takes Claritin daily as needed.Takes Singulair daily as needed.   TIA (transient ischemic attack) 05/27/2011   Urethral stricture    Vertigo    takes Meclizine daily as needed    Past Surgical History:  Procedure  Laterality Date   ARTHROTOMY Right 09/21/2020   Procedure: Right elbow open arthrotomy and synovectomy with radial head implant removal and repair reconstruction as necessary including ligamentous repair;  Surgeon: Roseanne Kaufman, MD;  Location: [place];  Service: Orthopedics;  Laterality: Right;  127mn   CESAREAN SECTION     COLONOSCOPY WITH PROPOFOL N/A 08/10/2012   Procedure: COLONOSCOPY WITH PROPOFOL;  Surgeon: MGarlan Fair MD;  [facility];  Service: Endoscopy;  Laterality: N/A;   CYSTOSCOPY WITH URETHRAL DILATATION N/A 02/18/2013   Procedure: CYSTOSCOPY WITH URETHRAL DILATATION ( NO BALLOON) AND HYDRODISTENSION;  Surgeon: TAlexis Frock MD;  [facility]  Service: Urology;  Laterality: N/A;   ENDOMETRIAL ABLATION W/ HYDROTHERMABLATOR  09-27-2002   INCISIONAL HERNIA REPAIR  N/A 10/25/2014   Procedure: HERNIA REPAIR INCISIONAL;  Surgeon: ARalene Ok MD;  [place] [facility]  Service: General;  Laterality: N/A;   [place] 03/26/2015   Procedure: LAPAROSCOPIC INCISIONAL HERNIA;  Surgeon: ARalene Ok MD;  [place]  Service: General;  Laterality: N/A;   INSERTION OF MESH N/A 10/25/2014   Procedure: INSERTION OF MESH;  Surgeon: ARalene Ok MD;  [place]  Service: General;  Laterality: N/A;   LAPAROSCOPIC LYSIS OF ADHESIONS N/A 03/26/2015   Procedure: LAPAROSCOPIC LYSIS OF ADHESIONS;  Surgeon: ARalene Ok MD;  [place]  Service: General;  Laterality: N/A;   LAPAROTOMY N/A 10/25/2014   Procedure: EXPLORATORY LAPAROTOMY;  Surgeon: ARalene Ok MD;  [place]  Service: General;  Laterality: N/A;   LYSIS OF ADHESION N/A 10/25/2014   Procedure: LYSIS OF ADHESIONS ;  Surgeon: ARalene Ok MD;  [place]  Service: General;  Laterality: N/A;   MUCOSAL ADVANCEMENT FLAP N/A 10/25/2014   Procedure: MUCOSAL ADVANCEMENT FLAP;  Surgeon: ARalene Ok MD;  [place]  Service: General;  Laterality: N/A;   ORIF RADIAL FRACTURE Right 06/17/2016   Procedure: right radial head arthroplasty with ligament repair, reconstruciton;  Surgeon: GRoseanne Kaufman MD;  [place]  Service: Orthopedics;  Laterality: Right;   RADIAL HEAD ARTHROPLASTY Right 06/17/2016   TOOTH EXTRACTION     TRANSTHORACIC ECHOCARDIOGRAM  05-26-2011   MILD LVH/  EF 670-35%/ GRADE I DIASTOLIC DYSFUNCTION/  MILD AR//   06-20-2008  NOMRAL STRESS ECHO   UMBILICAL HERNIA REPAIR  2013   AND REVISION THE SAME YEAR W/ MESH   WISDOM TOOTH EXTRACTION  AGE 23    Current Medications: No outpatient medications have been marked as taking for the 04/16/21 encounter (Appointment) with JMartinique Dhalia Zingaro M, MD.     Allergies:   Hydrocodone, Hydrocodone-acetaminophen, and Tramadol   Social History   Socioeconomic History   Marital status:  Divorced    Spouse name: Not on file   Number of children: 2   Years of education: Not on file   Highest education level: Not on file  Occupational History   Occupation: teacher  Tobacco Use   Smoking status: Former    Packs/day: 0.25    Years: 2.00    Pack years: 0.50    Types: Cigarettes    Quit date: 02/04/1976    Years since quitting: 45.2   Smokeless tobacco: Never  Vaping Use   Vaping Use: Never used  Substance and Sexual Activity   Alcohol use: Yes    Comment: occasional   Drug use: No   Sexual activity: Not on file  Other Topics Concern   Not on file  Social  History Narrative   Not on file   Social Determinants of Health   Financial Resource Strain: Not on file  Food Insecurity: Not on file  Transportation Needs: Not on file  Physical Activity: Not on file  Stress: Not on file  Social Connections: Not on file     Family History: The patient's family history includes Cirrhosis in her father; Dementia in her mother; Leukemia in her child.  ROS:   Please see the history of present illness.     EKGs/Labs/Other Studies Reviewed:    The following studies were reviewed today:  Holter Monitor 09/2015: Normal sinus rhythm Rare isolated PVCs. Total of 35 in 48 hours Diary events occur with and without PVCs. _______________  Echocardiogram 12/09/2019: Impressions: 1. Left ventricular ejection fraction, by estimation, is 60 to 65%. The  left ventricle has normal function. The left ventricle has no regional  wall motion abnormalities. There is mild concentric left ventricular  hypertrophy. Left ventricular diastolic  parameters are consistent with Grade I diastolic dysfunction (impaired  relaxation).   2. Right ventricular systolic function is normal. The right ventricular  size is normal.   3. The mitral valve is normal in structure. Trivial mitral valve  regurgitation.   4. The aortic valve is normal in structure. There is mild thickening of  the aortic  valve. Aortic valve regurgitation is mild.   5. There is mild dilatation of the ascending aorta, measuring 40 mm.   Comparison(s): Compared to prior TTE in 2018, there is no significant  change. The size of the ascending aorta remains stable at 57m.  Coronary CTA 08/27/20: ADDENDUM REPORT: 08/27/2020 16:09   CLINICAL DATA:  46F with hypertension, hyperlipidemia, TIA and ascending aorta aneurysm for perioperative risk assessment.   EXAM: Cardiac/Coronary  CT   TECHNIQUE: The patient was scanned on a [employer]   FINDINGS: A 120 kV prospective scan was triggered in the descending thoracic aorta at 111 HU's. Axial non-contrast 3 mm slices were carried out through the heart. The data set was analyzed on a dedicated work station and scored using the AIona Gantry rotation speed was 250 msecs and collimation was .6 mm. No beta blockade and 0.8 mg of sl NTG was given. The 3D data set was reconstructed in 5% intervals of the 67-82 % of the R-R cycle. Diastolic phases were analyzed on a dedicated work station using MPR, MIP and VRT modes. The patient received 80 cc of contrast.   Aorta: Mild ascending aorta aneurysm measuring 4.0 cm. Calcification of the descending aorta. No dissection.   Aortic Valve:  Trileaflet.  No calcifications.   Coronary Arteries:  Normal coronary origin.  Right dominance.   RCA is a large dominant artery that gives rise to PDA and 3 PLV branches. There is no plaque.   Left main is a large artery that gives rise to LAD, RI, and LCX arteries.   LAD is a small vessel that has minimal (<25%) calcified plaque in the mid LAD.   LCX is a non-dominant artery that gives rise to one small OM1 branch. There is no plaque.   RI has no plaque.   Coronary Calcium Score:   Left main: 0   Left anterior descending artery: 0.381   Left circumflex artery: 0   Right coronary artery: 0   Total: 0.381   Percentile: 45th   Other  findings:   Normal pulmonary vein drainage into the left atrium. There are three pulmonary veins on the  right and two on the left.   Normal let atrial appendage without a thrombus.   Normal size of the pulmonary artery.   IMPRESSION: 1. Coronary calcium score of 0.381. This was 45th percentile for age-, race-, and sex-matched controls.   2. Normal coronary origin with right dominance.   3.  Minimal (<25%) stenosis of the mid LAD.  CAD-RADS 1.   Skeet Latch, MD     Electronically Signed   By: Skeet Latch   On: 08/27/2020 16:09  EKG:  EKG not ordered today.   Recent Labs: 02/08/2021: ALT 31 02/09/2021: BUN 15; Creatinine, Ser 0.67; Hemoglobin 13.4; Platelets 178; Potassium 3.5; Sodium 139  Recent Lipid Panel    Component Value Date/Time   CHOL 192 05/26/2011 1245   TRIG 122 05/26/2011 1245   HDL 65 05/26/2011 1245   CHOLHDL 3.0 05/26/2011 1245   VLDL 24 05/26/2011 1245   LDLCALC 103 (H) 05/26/2011 1245    Physical Exam:    Vital Signs: LMP 02/04/2004     Wt Readings from Last 3 Encounters:  02/08/21 160 lb 0.9 oz (72.6 kg)  09/21/20 160 lb (72.6 kg)  09/18/20 163 lb (73.9 kg)     General: 71 y.o. female in no acute distress. HEENT: Normocephalic and atraumatic. Sclera clear. EOMs intact. Neck: Supple. No JVD. Heart: RRR. Distinct S1 and S2. No significant murmurs, gallops, or rubs.  Lungs: No increased work of breathing. Clear to ausculation bilaterally. No wheezes, rhonchi, or rales.  Abdomen: Soft, non-distended, and non-tender to palpation.  MSK: Normal strength and tone for age.  Extremities: No lower extremity edema.    Skin: Warm and dry. Neuro: Alert and oriented x3. No focal deficits. Psych: Normal affect. Responds appropriately.  Assessment:    No diagnosis found.   Plan:    CAD- minimal plaque noted on CTA  Palpitations PVCs - Monitor in 2017 showed PVCs.  - Well controlled on beta-blocker.  - Continue Toprol-XL '100mg'$  daily.    Hypertension - BP elevated at 162/74 in the office. However, patient keeps a close log of her BP and it is mostly well controlled (< 130/80) with only occasional readings with systolic BP in the 322'G. Patient has a family member who is a Marine scientist and confirmed that home cuff is accurate.  - Continue current medications: Amlodipine '5mg'$  daily, HCTZ '25mg'$  daily, Losartan '100mg'$  daily, Toprol-XL '100mg'$  daily.   Hyperlipidemia - Last lipid panel from 11/2018: Total Cholesterol 172, Triglycerides 178, HDL 59, LDL 91.  - Continue Pravastatin '40mg'$  daily.  - Followed by PCP.   Thoracic Aortic Aneurysm - Recent Echo in 11/2019 showed stale mild dilatation of ascending aorta measuring 50m. Unchanged from Echo in 2018. - Continue strict control of BP.  - Will need to continue routine monitoring of things. Was initially planning on getting chest CTA before next follow-up visit in 6 months. However, given reports of left arm pain, will order coronary CTA as above. Aneurysm can be evaluated on this.  Disposition: Follow up in 6 months with Dr. JMartinique   Medication Adjustments/Labs and Tests Ordered: Current medicines are reviewed at length with the patient today.  Concerns regarding medicines are outlined above.  No orders of the defined types were placed in this encounter.  No orders of the defined types were placed in this encounter.   There are no Patient Instructions on file for this visit.   Signed, Ellysia Char JMartinique MD  04/15/2021 4:53 PM    [facility]

## 2021-04-16 ENCOUNTER — Ambulatory Visit: Payer: Medicare PPO | Admitting: Cardiology

## 2021-05-02 ENCOUNTER — Ambulatory Visit: Payer: Medicare PPO | Admitting: Cardiology

## 2021-05-02 ENCOUNTER — Ambulatory Visit: Payer: Medicare PPO | Admitting: Physician Assistant

## 2021-05-04 NOTE — Progress Notes (Deleted)
?Cardiology Office Note:   ? ?Date:  05/04/2021  ? ?Cynthia Peters, DOB Sep 29, 1950, MRN [id number] ? ?PCP:  Aletha Halim., PA-C  ?Cardiologist:  Endrit Gittins Martinique, MD  ?Electrophysiologist:  None  ? ?Referring MD: Aletha Halim., PA-C  ? ?Chief Complaint: routine follow-up of palpitations or hypertension ? ?History of Present Illness:   ? ?Cynthia Peters is a 71 y.o. female with a history of palpitations with PVCs noted on monitor in 2017, thoracic aortic aneurysm, hypertension, hyperlipidemia, TIA in 2013, GERD, IBS, and vertigo who is seen today for routine follow-up. ? ?Patient has a history of palpitations. Monitor in 2017 showed sinus rhythm with rare PVCs. Palpitations ultimately improved with increase in beta-blocker. Patient was last seen by Roby Lofts, PA-C, for a virtual visit in 11/2019 at which time she was doing well from a cardiac standpoint. Echo was ordered for routine monitoring of thoracic aorta and showed LVEF of 60-65% with normal wall motion, grade 1 diastolic dysfunction, mild AI, and mild dilatation of ascending aorta measuring 38m (stable from prior Echo in 2018). ? ?She was recently seen in the ED on 05/03/2020 for dizziness similar to episodes of vertigo in the past. Improved with Meclizine. ? ?When seen last year she complained of chest pain. Coronary CTA was done showing minimal nonobstructive CAD. Ascending aorta was stable at 4.0 cm. ? ? ? ?Past Medical History:  ?Diagnosis Date  ? Anemia   ? history in the past, not on a regular basis  ? Arthritis   ? Bronchitis   ? Cataracts, bilateral   ? immature  ? Claustrophobia   ? takes Valium daily as needed  ? Complication of anesthesia   ? 2013 SURGERY vocal cord bothering pt, used smaller ent tube after 2 hernia repair, no problem after . " I woke up during my MRI and colonoscopy."       ? Depression   ? takes Wellbutrin daily  ? Dry eye   ? Dry mouth   ? GERD (gastroesophageal reflux disease)   ? takes Omeprazole daily    ? H/O hiatal hernia   ? Headache   ? migraines with flashing lights   ? History of blood clots 40 yrs   ? leg   ? History of echocardiogram 2013  ? a. Echo (05/2011):  Mild LVH, EF 65-70%, no WMA, Gr 1 DD, mild AI.  ? History of MRSA infection   ? History of staph infection   ? History of stress test 2010  ? a. ETT-Echo in 2010 was normal  ? History of TIA (transient ischemic attack) 2013  ? a. Carotid [place](05/2011):  No ICA stenosis, pt unsure of this  ? Hyperlipidemia   ? takes Simvastatin daily   ? Hypertension   ? takes  Losartan daily  ? IBS (irritable bowel syndrome)   ? takes Bentyl daily as needed  ? Insomnia   ? takes Ambien nightly  ? Interstitial cystitis   ? Lichen planus   ? Mild asthma   ? Albuterol inhaler as needed  ? Palpitations   ? Event monitor in 2007 with rare PVCs // takes Metoprolol daily  ? PVC (premature ventricular contraction)   ? Seasonal allergies   ? takes Claritin daily as needed.Takes Singulair daily as needed.  ? TIA (transient ischemic attack) 05/27/2011  ? Urethral stricture   ? Vertigo   ? takes Meclizine daily as needed  ? ? ?Past Surgical History:  ?Procedure  Laterality Date  ? ARTHROTOMY Right 09/21/2020  ? Procedure: Right elbow open arthrotomy and synovectomy with radial head implant removal and repair reconstruction as necessary including ligamentous repair;  Surgeon: Roseanne Kaufman, MD;  Location: [place];  Service: Orthopedics;  Laterality: Right;  144mn  ? CESAREAN SECTION    ? COLONOSCOPY WITH PROPOFOL N/A 08/10/2012  ? Procedure: COLONOSCOPY WITH PROPOFOL;  Surgeon: MGarlan Fair MD;  [facility];  Service: Endoscopy;  Laterality: N/A;  ? CYSTOSCOPY WITH URETHRAL DILATATION N/A 02/18/2013  ? Procedure: CYSTOSCOPY WITH URETHRAL DILATATION ( NO BALLOON) AND HYDRODISTENSION;  Surgeon: TAlexis Frock MD;  [facility]  Service: Urology;  Laterality: N/A;  ? ENDOMETRIAL ABLATION W/ HYDROTHERMABLATOR  09-27-2002  ? INCISIONAL HERNIA REPAIR  N/A 10/25/2014  ? Procedure: HERNIA REPAIR INCISIONAL;  Surgeon: ARalene Ok MD;  [place] [facility]  Service: General;  Laterality: N/A;  ? [place] 03/26[place]2015  ? Procedure: LAPAROSCOPIC INCISIONAL HERNIA;  Surgeon: ARalene Ok MD;  [place]  Service: General;  Laterality: N/A;  ? INSERTION OF MESH N/A 10/25/2014  ? Procedure: INSERTION OF MESH;  Surgeon: ARalene Ok MD;  [place] [facility]  Service: General;  Laterality: N/A;  ? LAPAROSCOPIC LYSIS OF ADHESIONS N/A 03/26/2015  ? Procedure: LAPAROSCOPIC LYSIS OF ADHESIONS;  Surgeon: ARalene Ok MD;  [place]  Service: General;  Laterality: N/A;  ? LAPAROTOMY N/A 10/25/2014  ? Procedure: EXPLORATORY LAPAROTOMY;  Surgeon: ARalene Ok MD;  [place] [facility] [place]  Service: General;  Laterality: N/A;  ? LYSIS OF ADHESION N/A 10/25/2014  ? Procedure: LYSIS OF ADHESIONS ;  Surgeon: ARalene Ok MD;  [place]  Service: General;  Laterality: N/A;  ? MUCOSAL ADVANCEMENT FLAP N/A 10/25/2014  ? Procedure: MUCOSAL ADVANCEMENT FLAP;  Surgeon: ARalene Ok MD;  [place] [facility]  Service: General;  Laterality: N/A;  ? ORIF RADIAL FRACTURE Right 06/17/2016  ? Procedure: right radial head arthroplasty with ligament repair, reconstruciton;  Surgeon: GRoseanne Kaufman MD;  [place]  Service: Orthopedics;  Laterality: Right;  ? RADIAL HEAD ARTHROPLASTY Right 06/17/2016  ? TOOTH EXTRACTION    ? TRANSTHORACIC ECHOCARDIOGRAM  05-26-2011  ? MILD LVH/  EF 677-82%/ GRADE I DIASTOLIC DYSFUNCTION/  MILD AR//   06-20-2008  NOMRAL STRESS ECHO  ? UMBILICAL HERNIA REPAIR  2013  ? AND REVISION THE SAME YEAR W/ MESH  ? WISDOM TOOTH EXTRACTION  AGE 71 ? ? ?Current Medications: ?No outpatient medications have been marked as taking for the 05/06/21 encounter (Appointment) with JMartinique Ceaira Ernster M, MD.  ?  ? ?Allergies:   Hydrocodone, Hydrocodone-acetaminophen, and Tramadol  ? ?Social History  ? ?Socioeconomic History  ? Marital status:  Divorced  ?  Spouse name: Not on file  ? Number of children: 2  ? Years of education: Not on file  ? Highest education level: Not on file  ?Occupational History  ? Occupation: tPharmacist, hospital ?Tobacco Use  ? Smoking status: Former  ?  Packs/day: 0.25  ?  Years: 2.00  ?  Pack years: 0.50  ?  Types: Cigarettes  ?  Quit date: 02/04/1976  ?  Years since quitting: 45.2  ? Smokeless tobacco: Never  ?Vaping Use  ? Vaping Use: Never used  ?Substance and Sexual Activity  ? Alcohol use: Yes  ?  Comment: occasional  ? Drug use: No  ? Sexual activity: Not on file  ?Other Topics Concern  ? Not on file  ?Social  History Narrative  ? Not on file  ? ?Social Determinants of Health  ? ?Financial Resource Strain: Not on file  ?Food Insecurity: Not on file  ?Transportation Needs: Not on file  ?Physical Activity: Not on file  ?Stress: Not on file  ?Social Connections: Not on file  ?  ? ?Family History: ?The patient's family history includes Cirrhosis in her father; Dementia in her mother; Leukemia in her child. ? ?ROS:   ?Please see the history of present illness.    ? ?EKGs/Labs/Other Studies Reviewed:   ? ?The following studies were reviewed today: ? ?Holter Monitor 09/2015: ?Normal sinus rhythm ?Rare isolated PVCs. Total of 35 in 48 hours ?Diary events occur with and without PVCs. ?_______________ ? ?Echocardiogram 12/09/2019: ?Impressions: ?1. Left ventricular ejection fraction, by estimation, is 60 to 65%. The  ?left ventricle has normal function. The left ventricle has no regional  ?wall motion abnormalities. There is mild concentric left ventricular  ?hypertrophy. Left ventricular diastolic  ?parameters are consistent with Grade I diastolic dysfunction (impaired  ?relaxation).  ? 2. Right ventricular systolic function is normal. The right ventricular  ?size is normal.  ? 3. The mitral valve is normal in structure. Trivial mitral valve  ?regurgitation.  ? 4. The aortic valve is normal in structure. There is mild thickening of  ?the aortic  valve. Aortic valve regurgitation is mild.  ? 5. There is mild dilatation of the ascending aorta, measuring 40 mm.  ? ?Comparison(s): Compared to prior TTE in 2018, there is no significant  ?change. The size of

## 2021-05-06 ENCOUNTER — Ambulatory Visit: Payer: Medicare PPO | Admitting: Cardiology

## 2021-05-06 NOTE — Telephone Encounter (Signed)
Spoke to patient she rescheduled appointment with Laurann Montana NP 4/6 at 3:30 pm [facility] location. ?

## 2021-05-08 NOTE — Progress Notes (Deleted)
? ? ?Office Visit  ?  ?Patient Name: Cynthia Peters ?Date of Encounter: 05/08/2021 ? ?Primary Care Provider:  Aletha Halim., PA-C ?Primary Cardiologist:  Peter Martinique, MD ?Primary Electrophysiologist: None ?Chief Complaint  ?  ?79-monthfollow-up for hypertension ? ? Patient Profile: ?HTN ?HLD ?GERD ?Asthma ?TIA (2013) ? ? Recent Studies: ?03/2013 24-hour event monitor: NSR, isolated PVCs, total of 35 to 48 hours ?12/2019 TTE: EF 60-65%, no RWMA, grade 1 DD, mild MR mild ascending aortic dilation 40 mm ?08/2020 cardiac CTA: Coronary calcium score 0.381, 45 percentile, minimal less than 25% stenosis of the mid LAD, ascending aorta 4.0 cm ? ?History of Present Illness  ?  ?Cynthia SUNDERLINis a 71y.o. female with PMH of thoracic aortic aneurysm, HTN, HLD, TIA in 2013, IBS, GERD PVCs.  She has a history of multiple abdominal surgeries and small bowel obstructions.  He was last seen by KKathyrn Sheriff PA on 05/2020.  During visit patient had complained of right chest tightness and left arm pain, cardiac CTA was ordered to rule out CAD, findings revealed nonobstructive CAD with 45 percentile calcium score. She was last seen in the emergency room on 02/2021 with complaint of abdominal pain.  Found to have small bowel obstruction that resolved after n.p.o. and IV hydration.  She was discharged the next day. ? ?Since last being seen in our clinic she reports doing ***.  '@He'$ /She@ denies chest pain, palpitations, dyspnea, PND, orthopnea, nausea, vomiting, dizziness, syncope, edema, weight gain, or early satiety. ? ? ?Past Medical History  ?  ?Past Medical History:  ?Diagnosis Date  ? Anemia   ? history in the past, not on a regular basis  ? Arthritis   ? Bronchitis   ? Cataracts, bilateral   ? immature  ? Claustrophobia   ? takes Valium daily as needed  ? Complication of anesthesia   ? 2013 SURGERY vocal cord bothering pt, used smaller ent tube after 2 hernia repair, no problem after . " I woke up during my MRI and  colonoscopy."       ? Depression   ? takes Wellbutrin daily  ? Dry eye   ? Dry mouth   ? GERD (gastroesophageal reflux disease)   ? takes Omeprazole daily   ? H/O hiatal hernia   ? Headache   ? migraines with flashing lights   ? History of blood clots 40 yrs   ? leg   ? History of echocardiogram 2013  ? a. Echo (05/2011):  Mild LVH, EF 65-70%, no WMA, Gr 1 DD, mild AI.  ? History of MRSA infection   ? History of staph infection   ? History of stress test 2010  ? a. ETT-Echo in 2010 was normal  ? History of TIA (transient ischemic attack) 2013  ? a. Carotid [place](05[place]):  No ICA stenosis, pt unsure of this  ? Hyperlipidemia   ? takes Simvastatin daily   ? Hypertension   ? takes  Losartan daily  ? IBS (irritable bowel syndrome)   ? takes Bentyl daily as needed  ? Insomnia   ? takes Ambien nightly  ? Interstitial cystitis   ? Lichen planus   ? Mild asthma   ? Albuterol inhaler as needed  ? Palpitations   ? Event monitor in 2007 with rare PVCs // takes Metoprolol daily  ? PVC (premature ventricular contraction)   ? Seasonal allergies   ? takes Claritin daily as needed.Takes Singulair daily as needed.  ?  TIA (transient ischemic attack) 05/27/2011  ? Urethral stricture   ? Vertigo   ? takes Meclizine daily as needed  ? ?Past Surgical History:  ?Procedure Laterality Date  ? ARTHROTOMY Right 09/21/2020  ? Procedure: Right elbow open arthrotomy and synovectomy with radial head implant removal and repair reconstruction as necessary including ligamentous repair;  Surgeon: Roseanne Kaufman, MD;  Location: [facility];  Service: Orthopedics;  Laterality: Right;  17mn  ? CESAREAN SECTION    ? COLONOSCOPY WITH PROPOFOL N/A 08/10/2012  ? Procedure: COLONOSCOPY WITH PROPOFOL;  Surgeon: MGarlan Fair MD;  Location: [facility];  Service: Endoscopy;  Laterality: N/A;  ? CYSTOSCOPY WITH URETHRAL DILATATION N/A 02/18/2013  ? Procedure: CYSTOSCOPY WITH URETHRAL DILATATION ( NO BALLOON) AND HYDRODISTENSION;  Surgeon: TAlexis Frock MD;   [facility]  Service: Urology;  Laterality: N/A;  ? ENDOMETRIAL ABLATION W/ HYDROTHERMABLATOR  09-27-2002  ? INCISIONAL HERNIA REPAIR N/A 10/25/2014  ? Procedure: HERNIA REPAIR INCISIONAL;  Surgeon: ARalene Ok MD;  [place]  Service: General;  Laterality: N/A;  ? [place] 03/26/2015  ? Procedure: LAPAROSCOPIC INCISIONAL HERNIA;  Surgeon: ARalene Ok MD;  [place]  Service: General;  Laterality: N/A;  ? INSERTION OF MESH N/A 10/25/2014  ? Procedure: INSERTION OF MESH;  Surgeon: ARalene Ok MD;  [place]  Service: General;  Laterality: N/A;  ? LAPAROSCOPIC LYSIS OF ADHESIONS N/A 03/26/2015  ? Procedure: LAPAROSCOPIC LYSIS OF ADHESIONS;  Surgeon: ARalene Ok MD;  [place] [facility]  Service: General;  Laterality: N/A;  ? LAPAROTOMY N/A 10/25/2014  ? Procedure: EXPLORATORY LAPAROTOMY;  Surgeon: ARalene Ok MD;  [place]  Service: General;  Laterality: N/A;  ? LYSIS OF ADHESION N/A 10/25/2014  ? Procedure: LYSIS OF ADHESIONS ;  Surgeon: ARalene Ok MD;  [place]  Service: General;  Laterality: N/A;  ? MUCOSAL ADVANCEMENT FLAP N/A 10/25/2014  ? Procedure: MUCOSAL ADVANCEMENT FLAP;  Surgeon: ARalene Ok MD;  [place] [facility]  Service: General;  Laterality: N/A;  ? ORIF RADIAL FRACTURE Right 06/17/2016  ? Procedure: right radial head arthroplasty with ligament repair, reconstruciton;  Surgeon: GRoseanne Kaufman MD;  [place] [facility]  Service: Orthopedics;  Laterality: Right;  ? RADIAL HEAD ARTHROPLASTY Right 06/17/2016  ? TOOTH EXTRACTION    ? TRANSTHORACIC ECHOCARDIOGRAM  05-26-2011  ? MILD LVH/  EF 676-16%/ GRADE I DIASTOLIC DYSFUNCTION/  MILD AR//   06-20-2008  NOMRAL STRESS ECHO  ? UMBILICAL HERNIA REPAIR  2013  ? AND REVISION THE SAME YEAR W/ MESH  ? WISDOM TOOTH EXTRACTION  AGE 71 ? ? ?Allergies ? ?Allergies  ?Allergen Reactions  ? Hydrocodone Rash  ? Hydrocodone-Acetaminophen Rash  ? Tramadol Palpitations   ? ? ?Home Medications  ?  ?Current Outpatient Medications  ?Medication Sig Dispense Refill  ? acetaminophen (TYLENOL) 500 MG tablet Take 1,000 mg by mouth every 6 (six) hours as needed (pain).     ? albuterol (VENTOLIN HFA) 108 (90 Base) MCG/ACT inhaler Inhale 2 puffs into the lungs every 6 (six) hours as needed for wheezing or shortness of breath.    ? amLODipine (NORVASC) 5 MG tablet Take 5 mg by mouth daily.    ? aspirin 81 MG chewable tablet Chew 81 mg by mouth daily.    ? buPROPion (WELLBUTRIN XL) 300 MG 24 hr tablet Take 300 mg by mouth daily.    ? Cholecalciferol (VITAMIN D) 2000 units CAPS Take 2,000 Units by mouth  daily.    ? clobetasol cream (TEMOVATE) 1.02 % Apply 1 application topically 2 (two) times daily as needed (rash).   7  ? Coenzyme Q10 (CO Q 10) 100 MG CAPS Take 100 mg by mouth daily.     ? [employer] VITAMIN C 1000 MG tablet Take 2,000 mg by mouth daily.  1  ? Evening Primrose Oil 1000 MG CAPS Take 1,000 mg by mouth daily.    ? fexofenadine (ALLEGRA) 180 MG tablet Take 180 mg by mouth daily.    ? Flaxseed, Linseed, 1000 MG CAPS Take 1,000 mg by mouth daily.    ? fluticasone (FLONASE) 50 MCG/ACT nasal spray Place 2 sprays into both nostrils daily as needed for allergies or rhinitis.    ? hydrochlorothiazide 25 MG tablet Take 25 mg by mouth daily.    ? hydrocortisone cream 1 % Apply 1 application topically 4 (four) times daily as needed for itching.    ? losartan (COZAAR) 100 MG tablet TAKE 1 TABLET BY MOUTH EVERY DAY (Patient taking differently: Take 100 mg by mouth daily.) 90 tablet 3  ? magnesium oxide (MAG-OX) 400 MG tablet Take 400 mg by mouth daily.    ? meclizine (ANTIVERT) 25 MG tablet Take 1 tablet (25 mg total) by mouth 3 (three) times daily as needed for dizziness. 30 tablet 0  ? metoprolol succinate (TOPROL-XL) 100 MG 24 hr tablet Take 1 tablet (100 mg total) by mouth 2 (two) times daily. Take with or immediately following a meal. Take 100 mg by mouth 2 (two) times daily. Take with or  immediately following a meal. (Patient taking differently: Take 100 mg by mouth 2 (two) times daily. Take with or immediately following a meal.) 30 tablet 1  ? montelukast (SINGULAIR) 10 MG tablet Take 10 mg by mouth

## 2021-05-09 ENCOUNTER — Ambulatory Visit (HOSPITAL_BASED_OUTPATIENT_CLINIC_OR_DEPARTMENT_OTHER): Payer: Medicare PPO | Admitting: Family

## 2021-05-09 ENCOUNTER — Encounter (HOSPITAL_BASED_OUTPATIENT_CLINIC_OR_DEPARTMENT_OTHER): Payer: Self-pay

## 2021-06-25 ENCOUNTER — Other Ambulatory Visit: Payer: Self-pay

## 2021-06-25 ENCOUNTER — Encounter (HOSPITAL_BASED_OUTPATIENT_CLINIC_OR_DEPARTMENT_OTHER): Payer: Self-pay | Admitting: Pediatrics

## 2021-06-25 ENCOUNTER — Observation Stay (HOSPITAL_BASED_OUTPATIENT_CLINIC_OR_DEPARTMENT_OTHER)
Admission: EM | Admit: 2021-06-25 | Discharge: 2021-06-27 | Disposition: A | Discharge status: Home / Self Care | Payer: Medicare PPO | Attending: Internal Medicine | Admitting: Internal Medicine

## 2021-06-25 ENCOUNTER — Emergency Department (HOSPITAL_BASED_OUTPATIENT_CLINIC_OR_DEPARTMENT_OTHER): Payer: Medicare PPO

## 2021-06-25 DIAGNOSIS — I700 Atherosclerosis of aorta: Secondary | ICD-10-CM | POA: Diagnosis not present

## 2021-06-25 DIAGNOSIS — I493 Ventricular premature depolarization: Secondary | ICD-10-CM | POA: Diagnosis present

## 2021-06-25 DIAGNOSIS — E785 Hyperlipidemia, unspecified: Secondary | ICD-10-CM | POA: Diagnosis present

## 2021-06-25 DIAGNOSIS — I10 Essential (primary) hypertension: Secondary | ICD-10-CM | POA: Insufficient documentation

## 2021-06-25 DIAGNOSIS — K219 Gastro-esophageal reflux disease without esophagitis: Secondary | ICD-10-CM | POA: Diagnosis present

## 2021-06-25 DIAGNOSIS — K56609 Unspecified intestinal obstruction, unspecified as to partial versus complete obstruction: Secondary | ICD-10-CM

## 2021-06-25 DIAGNOSIS — I712 Thoracic aortic aneurysm, without rupture, unspecified: Secondary | ICD-10-CM | POA: Diagnosis not present

## 2021-06-25 DIAGNOSIS — Z96621 Presence of right artificial elbow joint: Secondary | ICD-10-CM | POA: Diagnosis not present

## 2021-06-25 DIAGNOSIS — Z87891 Personal history of nicotine dependence: Secondary | ICD-10-CM | POA: Diagnosis not present

## 2021-06-25 DIAGNOSIS — E782 Mixed hyperlipidemia: Secondary | ICD-10-CM | POA: Diagnosis present

## 2021-06-25 DIAGNOSIS — Z8673 Personal history of transient ischemic attack (TIA), and cerebral infarction without residual deficits: Secondary | ICD-10-CM | POA: Diagnosis not present

## 2021-06-25 DIAGNOSIS — K56699 Other intestinal obstruction unspecified as to partial versus complete obstruction: Principal | ICD-10-CM | POA: Insufficient documentation

## 2021-06-25 DIAGNOSIS — D72829 Elevated white blood cell count, unspecified: Secondary | ICD-10-CM | POA: Diagnosis not present

## 2021-06-25 DIAGNOSIS — Z7982 Long term (current) use of aspirin: Secondary | ICD-10-CM | POA: Insufficient documentation

## 2021-06-25 DIAGNOSIS — Z86718 Personal history of other venous thrombosis and embolism: Secondary | ICD-10-CM | POA: Insufficient documentation

## 2021-06-25 DIAGNOSIS — J45909 Unspecified asthma, uncomplicated: Secondary | ICD-10-CM | POA: Diagnosis not present

## 2021-06-25 DIAGNOSIS — Z79899 Other long term (current) drug therapy: Secondary | ICD-10-CM | POA: Insufficient documentation

## 2021-06-25 DIAGNOSIS — R1084 Generalized abdominal pain: Secondary | ICD-10-CM | POA: Diagnosis present

## 2021-06-25 DIAGNOSIS — E1165 Type 2 diabetes mellitus with hyperglycemia: Secondary | ICD-10-CM | POA: Insufficient documentation

## 2021-06-25 LAB — MAGNESIUM: Magnesium: 1.9 mg/dL (ref 1.7–2.4)

## 2021-06-25 LAB — URINALYSIS, ROUTINE W REFLEX MICROSCOPIC
Bilirubin Urine: NEGATIVE
Glucose, UA: NEGATIVE mg/dL
Hgb urine dipstick: NEGATIVE
Ketones, ur: NEGATIVE mg/dL
Nitrite: NEGATIVE
Protein, ur: NEGATIVE mg/dL
Specific Gravity, Urine: 1.025 (ref 1.005–1.030)
pH: 6.5 (ref 5.0–8.0)

## 2021-06-25 LAB — URINALYSIS, MICROSCOPIC (REFLEX)

## 2021-06-25 LAB — COMPREHENSIVE METABOLIC PANEL
ALT: 24 U/L (ref 0–44)
AST: 25 U/L (ref 15–41)
Albumin: 4.1 g/dL (ref 3.5–5.0)
Alkaline Phosphatase: 62 U/L (ref 38–126)
Anion gap: 5 (ref 5–15)
BUN: 29 mg/dL — ABNORMAL HIGH (ref 8–23)
CO2: 27 mmol/L (ref 22–32)
Calcium: 9.4 mg/dL (ref 8.9–10.3)
Chloride: 106 mmol/L (ref 98–111)
Creatinine, Ser: 0.9 mg/dL (ref 0.44–1.00)
GFR, Estimated: >60 mL/min (ref >60)
Glucose, Bld: 138 mg/dL — ABNORMAL HIGH (ref 70–99)
Potassium: 3.9 mmol/L (ref 3.5–5.1)
Sodium: 138 mmol/L (ref 135–145)
Total Bilirubin: 1.2 mg/dL (ref 0.3–1.2)
Total Protein: 6.7 g/dL (ref 6.5–8.1)

## 2021-06-25 LAB — CBC
HCT: 40.9 % (ref 36.0–46.0)
Hemoglobin: 14.2 g/dL (ref 12.0–15.0)
MCH: 32.1 pg (ref 26.0–34.0)
MCHC: 34.7 g/dL (ref 30.0–36.0)
MCV: 92.5 fL (ref 80.0–100.0)
Platelets: 228 10*3/uL (ref 150–400)
RBC: 4.42 MIL/uL (ref 3.87–5.11)
RDW: 12.1 % (ref 11.5–15.5)
WBC: 13.6 10*3/uL — ABNORMAL HIGH (ref 4.0–10.5)
nRBC: 0 % (ref 0.0–0.2)

## 2021-06-25 LAB — LIPASE, BLOOD: Lipase: 31 U/L (ref 11–51)

## 2021-06-25 MED ORDER — FENTANYL CITRATE PF 50 MCG/ML IJ SOSY
50.0000 ug | PREFILLED_SYRINGE | INTRAMUSCULAR | Status: DC | PRN
  Administered 2021-06-25 (×2): 50 ug via INTRAVENOUS
  Filled 2021-06-25 (×2): qty 1

## 2021-06-25 MED ORDER — ACETAMINOPHEN 325 MG PO TABS: 650.0000 mg | ORAL_TABLET | Freq: Four times a day (QID) | ORAL | Status: DC | PRN

## 2021-06-25 MED ORDER — SODIUM CHLORIDE 0.9 % IV SOLN: INTRAVENOUS | Status: DC

## 2021-06-25 MED ORDER — ONDANSETRON HCL 4 MG/2ML IJ SOLN
4.0000 mg | Freq: Once | INTRAMUSCULAR | Status: AC
  Administered 2021-06-25: 4 mg via INTRAVENOUS
  Filled 2021-06-25: qty 2

## 2021-06-25 MED ORDER — SODIUM CHLORIDE 0.9 % IV BOLUS
1000.0000 mL | Freq: Once | INTRAVENOUS | Status: AC
  Administered 2021-06-25: 1000 mL via INTRAVENOUS

## 2021-06-25 MED ORDER — MAGNESIUM SULFATE 2 GM/50ML IV SOLN
2.0000 g | Freq: Once | INTRAVENOUS | Status: AC
Start: 2021-06-25 — End: 2021-06-26
  Administered 2021-06-25: 2 g via INTRAVENOUS
  Filled 2021-06-25: qty 50

## 2021-06-25 MED ORDER — FENTANYL CITRATE PF 50 MCG/ML IJ SOSY
50.0000 ug | PREFILLED_SYRINGE | Freq: Once | INTRAMUSCULAR | Status: AC
  Administered 2021-06-25: 50 ug via INTRAVENOUS
  Filled 2021-06-25: qty 1

## 2021-06-25 MED ORDER — POTASSIUM CHLORIDE IN NACL 20-0.9 MEQ/L-% IV SOLN
INTRAVENOUS | Status: DC
  Filled 2021-06-25 (×5): qty 1000

## 2021-06-25 MED ORDER — ACETAMINOPHEN 650 MG RE SUPP
650.0000 mg | Freq: Four times a day (QID) | RECTAL | Status: DC | PRN
Start: 2021-06-25 — End: 2021-06-27

## 2021-06-25 MED ORDER — METOPROLOL TARTRATE 5 MG/5ML IV SOLN
5.0000 mg | Freq: Four times a day (QID) | INTRAVENOUS | Status: DC
  Administered 2021-06-25 – 2021-06-26 (×2): 5 mg via INTRAVENOUS
  Administered 2021-06-26: 2.5 mg via INTRAVENOUS
  Filled 2021-06-25 (×3): qty 5

## 2021-06-25 MED ORDER — ENOXAPARIN SODIUM 40 MG/0.4ML IJ SOSY
40.0000 mg | PREFILLED_SYRINGE | INTRAMUSCULAR | Status: DC
  Administered 2021-06-25 – 2021-06-26 (×2): 40 mg via SUBCUTANEOUS
  Filled 2021-06-25 (×2): qty 0.4

## 2021-06-25 MED ORDER — IOHEXOL 350 MG/ML SOLN
100.0000 mL | Freq: Once | INTRAVENOUS | Status: AC | PRN
  Administered 2021-06-25: 100 mL via INTRAVENOUS

## 2021-06-25 MED ORDER — PROCHLORPERAZINE EDISYLATE 10 MG/2ML IJ SOLN
5.0000 mg | Freq: Once | INTRAMUSCULAR | Status: AC
Start: 2021-06-25 — End: 2021-06-25
  Administered 2021-06-25: 5 mg via INTRAVENOUS
  Filled 2021-06-25: qty 2

## 2021-06-25 MED ORDER — DIATRIZOATE MEGLUMINE & SODIUM 66-10 % PO SOLN
90.0000 mL | Freq: Once | ORAL | Status: AC
  Administered 2021-06-25: 90 mL via ORAL
  Filled 2021-06-25: qty 90

## 2021-06-25 MED ORDER — ONDANSETRON HCL 4 MG PO TABS: 4.0000 mg | ORAL_TABLET | Freq: Four times a day (QID) | ORAL | Status: DC | PRN

## 2021-06-25 MED ORDER — PANTOPRAZOLE SODIUM 40 MG IV SOLR
40.0000 mg | INTRAVENOUS | Status: DC
  Administered 2021-06-25 – 2021-06-26 (×2): 40 mg via INTRAVENOUS
  Filled 2021-06-25 (×2): qty 10

## 2021-06-25 MED ORDER — ONDANSETRON HCL 4 MG/2ML IJ SOLN
4.0000 mg | Freq: Four times a day (QID) | INTRAMUSCULAR | Status: DC | PRN
  Administered 2021-06-25: 4 mg via INTRAVENOUS
  Filled 2021-06-25: qty 2

## 2021-06-25 NOTE — ED Triage Notes (Signed)
C/O abdominal pain started around 4 am; w/ some nausea. Reported hx of 4 hernia and abdominal surgeries, as well as stable aortic aneurysm.

## 2021-06-25 NOTE — ED Notes (Signed)
[facility] at bedside 

## 2021-06-25 NOTE — ED Provider Notes (Signed)
[facility] EMERGENCY DEPARTMENT Provider Note   CSN: [id number] Arrival date & time: 06/25/21  4098     History Chief Complaint  Patient presents with   Abdominal Pain    Cynthia Peters is a 71 y.o. female.  Patient presents with 1 day onset of abdominal pain, says that it hurts all over and she rates it a 9/10 currently and 6/10 at its best. Denies any aggravating or relieving factors. She has had a history of multiple abdominal surgeries for prior hernias. Denies fever, chills, dyspnea, chest pain, dysuria, vaginal discharge, vaginal bleeding, constipation, emesis and hematochezia. Endorsing mild nausea that started this morning along with her abdominal pain. She has a history of IBS and denies significant bowel changes, overall she goes regularly. She is unsure if she is having radiating pain but is also have upper back pain. Former tobacco user, quit in high school. Occasional alcohol use, denies any excessive consumption.   The history is provided by the patient. No language interpreter was used.  Abdominal Pain Pain location:  Generalized Pain quality: aching and dull   Associated symptoms: nausea   Associated symptoms: no chest pain, no chills, no constipation, no cough, no diarrhea, no dysuria, no fever, no hematuria, no shortness of breath, no vaginal bleeding, no vaginal discharge and no vomiting       Home Medications Prior to Admission medications   Medication Sig Start Date End Date Taking? Authorizing Provider  acetaminophen (TYLENOL) 500 MG tablet Take 1,000 mg by mouth every 6 (six) hours as needed (pain).    Yes [provider]  albuterol (VENTOLIN HFA) 108 (90 Base) MCG/ACT inhaler Inhale 2 puffs into the lungs every 6 (six) hours as needed for wheezing or shortness of breath.   Yes [provider]  amLODipine (NORVASC) 5 MG tablet Take 5 mg by mouth daily. 05/21/20  Yes [provider]  aspirin 81 MG chewable tablet Chew 81  mg by mouth daily.   Yes [provider]  buPROPion (WELLBUTRIN XL) 300 MG 24 hr tablet Take 300 mg by mouth daily.   Yes [provider]  Cholecalciferol (VITAMIN D) 2000 units CAPS Take 2,000 Units by mouth daily.   Yes [provider]  clobetasol cream (TEMOVATE) 1.19 % Apply 1 application topically 2 (two) times daily as needed (rash).  04/01/16  Yes [provider]  Coenzyme Q10 (CO Q 10) 100 MG CAPS Take 100 mg by mouth daily.    Yes [provider]  [employer]S VITAMIN C 1000 MG tablet Take 2,000 mg by mouth daily. 06/13/16  Yes [provider]  Evening Primrose Oil 1000 MG CAPS Take 1,000 mg by mouth daily.   Yes [provider]  fexofenadine (ALLEGRA) 180 MG tablet Take 180 mg by mouth daily.   Yes [provider]  Flaxseed, Linseed, 1000 MG CAPS Take 1,000 mg by mouth daily.   Yes [provider]  fluticasone (FLONASE) 50 MCG/ACT nasal spray Place 2 sprays into both nostrils daily as needed for allergies or rhinitis.   Yes [provider]  hydrochlorothiazide 25 MG tablet Take 25 mg by mouth daily.   Yes [provider]  hydrocortisone cream 1 % Apply 1 application topically 4 (four) times daily as needed for itching.   Yes [provider]  losartan (COZAAR) 100 MG tablet TAKE 1 TABLET BY MOUTH EVERY DAY Patient taking differently: Take 100 mg by mouth daily. 09/14/17  Yes Martinique, Peter M, MD  magnesium oxide (MAG-OX) 400 MG tablet Take 400 mg by mouth daily.   Yes [provider]  meclizine (ANTIVERT) 25 MG tablet Take 1 tablet (25 mg total) by mouth 3 (three) times daily as needed for dizziness. 05/02/20  Yes Robinson, Martin[place] N, PA-C  metoprolol succinate (TOPROL-XL) 100 MG 24 hr tablet Take 1 tablet (100 mg total) by mouth 2 (two) times daily. Take with or immediately following a meal. Take 100 mg by mouth 2 (two) times daily. Take with or immediately following a meal. Patient taking  differently: Take 100 mg by mouth 2 (two) times daily. Take with or immediately following a meal. 07/14/18  Yes Martinique, Peter M, MD  montelukast (SINGULAIR) 10 MG tablet Take 10 mg by mouth daily.   Yes [provider]  Multiple Vitamins-Minerals (MULTIPLE VITAMINS/WOMENS PO) Take 1 tablet by mouth daily.   Yes [provider]  mupirocin ointment (BACTROBAN) 2 % Apply 1 application topically 3 (three) times daily as needed for rash. 06/20/20  Yes [provider]  pantoprazole (PROTONIX) 20 MG tablet Take 20 mg by mouth daily. 05/21/20  Yes [provider]  Polyethyl Glyc-Propyl Glyc PF (SYSTANE ULTRA PF) 0.4-0.3 % SOLN Place 1 drop into both eyes 4 (four) times daily as needed (Dry eye).   Yes [provider]  polyethylene glycol (MIRALAX / GLYCOLAX) 17 g packet Take 17 g by mouth 2 (two) times daily. Patient taking differently: Take 17 g by mouth daily. 11/30/19  Yes Ghimire, Dante Gang, MD  potassium chloride SA (KLOR-CON M20) 20 MEQ tablet TAKE 1&1/2 TABLETS BY MOUTH DAILY. Patient taking differently: Take 30 mEq by mouth daily. 02/16/20  Yes Martinique, Peter M, MD  pravastatin (PRAVACHOL) 40 MG tablet Take 40 mg by mouth daily. 05/21/20  Yes [provider]  Probiotic Product (ALIGN PO) Take 1 capsule by mouth 2 (two) times daily.   Yes [provider]  valACYclovir (VALTREX) 500 MG tablet INCREASE TO TAKING 1 TABLET 2 TIMES A DAY FOR 3 DAYS WITH OUTBREAK Patient taking differently: Take 500 mg by mouth daily. 03/25/16  Yes Kem Boroughs, FNP      Allergies    Hydrocodone, Hydrocodone-acetaminophen, and Tramadol    Review of Systems   Review of Systems  Constitutional:  Negative for activity change, appetite change, chills and fever.  HENT:  Negative for congestion.   Eyes:  Negative for visual disturbance.  Respiratory:  Negative for cough, chest tightness and shortness of breath.   Cardiovascular:  Negative for chest pain, palpitations  and leg swelling.  Gastrointestinal:  Positive for abdominal pain and nausea. Negative for abdominal distention, blood in stool, constipation, diarrhea and vomiting.  Genitourinary:  Negative for dysuria, flank pain, hematuria, pelvic pain, vaginal bleeding and vaginal discharge.  Musculoskeletal:  Positive for back pain.  Skin:  Negative for rash.  Neurological:  Negative for dizziness, speech difficulty, weakness and headaches.  Psychiatric/Behavioral:  Negative for confusion.    Physical Exam Updated Vital Signs BP (!) 156/63 (BP Location: Left Arm)   Pulse 65   Temp 98.3 F (36.8 C) (Oral)   Resp 18   Ht '5\' 4"'$  (1.626 m)   Wt 68 kg   LMP 02/04/2004   SpO2 96%   BMI 25.75 kg/m  Physical Exam Vitals reviewed.  Constitutional:      General: She is in acute distress (very mild acute distress).     Appearance: She is well-developed.  HENT:     Head: Normocephalic and  atraumatic.     Mouth/Throat:     Mouth: Mucous membranes are moist.     Pharynx: Oropharynx is clear.  Eyes:     General: No scleral icterus.    Pupils: Pupils are equal, round, and reactive to light.  Cardiovascular:     Rate and Rhythm: Normal rate and regular rhythm.     Heart sounds: Normal heart sounds. No murmur heard.   No gallop.  Pulmonary:     Effort: Pulmonary effort is normal. No respiratory distress.     Breath sounds: Normal breath sounds. No stridor. No wheezing or rales.  Abdominal:     General: Bowel sounds are normal. There is no distension or abdominal bruit. There are no signs of injury.     Palpations: Abdomen is soft. There is no fluid wave, hepatomegaly or mass.     Tenderness: There is generalized abdominal tenderness and tenderness in the right lower quadrant and left lower quadrant. There is no right CVA tenderness or left CVA tenderness. Negative signs include Murphy's sign, Rovsing's sign and McBurney's sign.     Hernia: No hernia is present.  Skin:    General: Skin is warm and  dry.     Capillary Refill: Capillary refill takes less than 2 seconds.     Coloration: Skin is not pale.     Findings: No erythema.  Neurological:     General: No focal deficit present.     Mental Status: She is alert and oriented to person, place, and time.     Cranial Nerves: No cranial nerve deficit.  Psychiatric:        Mood and Affect: Mood normal.        Behavior: Behavior normal.    ED Results / Procedures / Treatments   Labs (all labs ordered are listed, but only abnormal results are displayed) Labs Reviewed  COMPREHENSIVE METABOLIC PANEL - Abnormal; Notable for the following components:      Result Value   Glucose, Bld 138 (*)    BUN 29 (*)    All other components within normal limits  CBC - Abnormal; Notable for the following components:   WBC 13.6 (*)    All other components within normal limits  URINALYSIS, ROUTINE W REFLEX MICROSCOPIC - Abnormal; Notable for the following components:   Leukocytes,Ua TRACE (*)    All other components within normal limits  URINALYSIS, MICROSCOPIC (REFLEX) - Abnormal; Notable for the following components:   Bacteria, UA RARE (*)    All other components within normal limits  LIPASE, BLOOD  MAGNESIUM    EKG None  Radiology CT Angio Chest/Abd/Pel for Dissection W and/or Wo Contrast  Result Date: 06/25/2021 CLINICAL DATA:  Thoracic aortic dissection, follow up EXAM: CT ANGIOGRAPHY CHEST, ABDOMEN AND PELVIS TECHNIQUE: Non-contrast CT of the chest was initially obtained. Multidetector CT imaging through the chest, abdomen and pelvis was performed using the standard protocol during bolus administration of intravenous contrast. Multiplanar reconstructed images and MIPs were obtained and reviewed to evaluate the vascular anatomy. RADIATION DOSE REDUCTION: This exam was performed according to the departmental dose-optimization program which includes automated exposure control, adjustment of the mA and/or kV according to patient size and/or  use of iterative reconstruction technique. CONTRAST:  182m OMNIPAQUE IOHEXOL 350 MG/ML SOLN COMPARISON:  CT abdomen pelvis dated February 08, 2021; CT chest dated July 20 second, 2022 FINDINGS: CTA CHEST FINDINGS Cardiovascular: Similar aneurysmal dilation of the ascending thoracic aorta with relative preservation of the sino-tubular junction. It  measures approximately 4.1 cm x 4.0 cm, not significantly changed in comparison to prior. No evidence of acute dissection or intramural hematoma. No pericardial effusion. Scattered atherosclerotic calcifications. No acute pulmonary embolism through the proximal segmental pulmonary arteries. Mediastinum/Nodes: Visualized thyroid is unremarkable. No new axillary or mediastinal adenopathy. Unchanged appearance of a precarinal lymph node measuring 10 mm in the short axis. Lungs/Pleura: No pleural effusion or pneumothorax. Unchanged LEFT lower lobe 4 mm pulmonary nodule for greater than 2 years, consistent with a benign etiology (series 7, image 41). Musculoskeletal: No chest wall abnormality. No acute or significant osseous findings. Review of the MIP images confirms the above findings. CTA ABDOMEN AND PELVIS FINDINGS VASCULAR Aorta: Normal caliber aorta without aneurysm, dissection, vasculitis or significant stenosis. Celiac: Patent without evidence of aneurysm, dissection, vasculitis or significant stenosis. SMA: Patent without evidence of aneurysm, dissection, vasculitis or significant stenosis. Renals: Both renal arteries are patent without evidence of aneurysm, dissection, vasculitis, fibromuscular dysplasia or significant stenosis. IMA: Patent without evidence of aneurysm, dissection, vasculitis or significant stenosis. Inflow: Patent without evidence of aneurysm, dissection, vasculitis or significant stenosis. Veins: No obvious venous abnormality within the limitations of this arterial phase study. Review of the MIP images confirms the above findings. NON-VASCULAR  Hepatobiliary: Likely hepatic steatosis. Cyst at the hepatic dome and porta hepatis (for which no dedicated imaging follow-up is recommended). Gallbladder is unremarkable. No intrahepatic or extrahepatic biliary ductal dilation. Pancreas: Unremarkable for phase of contrast. Spleen: Unremarkable for phase of contrast.  Multiple splenules. Adrenals/Urinary Tract: Adrenals are unremarkable. Kidneys enhance symmetrically. No hydronephrosis. No obstructing nephrolithiasis. Bladder is decompressed. Stomach/Bowel: There is a small bowel obstruction. There is a gradual taper to the transition point in the lower mid abdomen (series 5, image [id number]; series 8, image 78-88). There is fecalization of contents within the distal aspect of the small bowel obstruction in the lower abdomen with small volume adjacent fluid and fat stranding (series 5, image 170; series 8, image 62). Scattered diverticulosis. Appendix is normal. There is a small hiatal hernia. Lymphatic: No new suspicious intra-abdominal lymphadenopathy. Reproductive: Uterus and bilateral adnexa are unremarkable. Other: Small volume free fluid. No free air. Fat containing RIGHT inguinal hernia. Postsurgical changes along the anterior abdominal wall. Musculoskeletal: Grade 1-2 anterolisthesis of L4-5, unchanged and likely due to facet arthropathy. Review of the MIP images confirms the above findings. IMPRESSION: 1. Small bowel obstruction with gradual tapering in the lower abdomen. There is a loop of dilated small bowel in the lower mid abdomen with fecalized contents and adjacent small volume free fluid and fat stranding. No definitive evidence bowel ischemia. 2. 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 No evidence of acute aortic injury. Recommend annual imaging followup by CTA or MRA. This recommendation follows 2010 ACCF/AHA/AATS/ACR/ASA/SCA/SCAI/SIR/STS/SVM Guidelines for the Diagnosis and Management of Patients  with Thoracic Aortic Disease. Circulation. 2010; 121: C144-Y185. Aortic aneurysm NOS (ICD10-I71.9) 3. No acute pulmonary embolism. Aortic Atherosclerosis (ICD10-I70.0). Electronically Signed   By: Valentino Saxon M.D.   On: 06/25/2021 10:47    Procedures Procedures  None  Medications Ordered in ED Medications  fentaNYL (SUBLIMAZE) injection 50 mcg (50 mcg Intravenous Given 06/25/21 1014)  ondansetron (ZOFRAN) injection 4 mg (4 mg Intravenous Given 06/25/21 1014)  iohexol (OMNIPAQUE) 350 MG/ML injection 100 mL (100 mLs Intravenous Contrast Given 06/25/21 1019)    ED Course/ Medical Decision Making/ A&P  Medical Decision Making Amount and/or Complexity of Data Reviewed Labs: ordered. Radiology: ordered.  Risk Prescription drug management. Decision regarding hospitalization.    Patient with extensive history of prior abdominal surgeries with related small bowel obstruction presents with reemergence of abdominal pain that started yesterday. Exam significant for generalized abdominal tenderness more prominently along lower quadrants bilaterally. Differentials remain broad as portions of the history are vague, unsure if back pain is related to abdominal etiology but concern for aortic aneurysm given symptoms and patient's prior history. Low suspicion for pancreatitis given lipase 31 LFTs within normal limits.  Concern for another small bowel obstruction secondary to adhesions given prior abdominal surgical history. Prior abdominal imaging notable for partial small bowel obstruction. Obtained CTA chest/abdomen/pelvis to evaluate for possible dissection or other abdominal etiology. Low concern for appendicitis or diverticulitis as imaging showed small bowel obstruction with gradual tapering in the lower abdomen with a loop of dilated small bowel in the lower mid abdomen with fecalized contents, a small volume of free fluid and fat stranding. Reassuringly no evidence of  bowel ischemia or acute aortic injury. Patient likely requiring admission for further management of this obstruction with possible surgical intervention. Discussed with surgery who agrees with admitting to medicine team and will see her when she arrives for possible surgical intervention. Discussed with hospitalist who agrees to admit patient.      Final Clinical Impression(s) / ED Diagnoses Final diagnoses:  Small bowel obstruction [facility])    Rx / DC Orders ED Discharge Orders     None         Donney Dice, DO 06/25/21 1143    Lucrezia Starch, MD 06/25/21 1314

## 2021-06-25 NOTE — Progress Notes (Signed)
Plan of Care Note for accepted transfer   Patient: Cynthia Peters MRN: [id number]   DOA: 06/25/2021  Facility requesting transfer: [facility] Requesting Provider: Donney Dice, DO and Madalyn Rob, MD. Reason for transfer: SBO. Facility course:  71 year old female with a past medical history of GERD, hyperlipidemia, hypertension, a stable AAA, multiple abdominal surgeries, multiple episodes of small bowel obstruction who is coming with abdominal pain and nausea since 0400 today.  She received fentanyl 50 mcg IVP and ondansetron 4 mg IVP.  General surgery was contacted and they will evaluate the patient when she arrives to the hospital.  She received fentanyl 50 mcg IVP, ondansetron 4 mg IVP and 1000 mL of normal saline bolus.  Lab work: Urinalysis, Microscopic (reflex) [[id number]] (Abnormal)   Collected: 06/25/21 0929   Updated: 06/25/21 0958    RBC / HPF 0-5 RBC/hpf   WBC, UA 0-5 WBC/hpf   Bacteria, UA RARE Abnormal    Squamous Epithelial / LPF 0-5   Mucus PRESENT   Amorphous Crystal PRESENT  Urinalysis, Routine w reflex microscopic [[id number]] (Abnormal)   Collected: 06/25/21 0929   Updated: 06/25/21 0958   Specimen Type: Urine    Color, Urine YELLOW   APPearance CLEAR   Specific Gravity, Urine 1.025   pH 6.5   Glucose, UA NEGATIVE mg/dL   Hgb urine dipstick NEGATIVE   Bilirubin Urine NEGATIVE   Ketones, ur NEGATIVE mg/dL   Protein, ur NEGATIVE mg/dL   Nitrite NEGATIVE   Leukocytes,Ua TRACE Abnormal   Lipase, blood [[id number]]   Collected: 06/25/21 0929   Updated: 06/25/21 0954   Specimen Type: Blood   Specimen Source: Vein    Lipase 31 U/L  Comprehensive metabolic panel [[id number]] (Abnormal)   Collected: 06/25/21 0929   Updated: 06/25/21 0954   Specimen Type: Blood   Specimen Source: Vein    Sodium 138 mmol/L   Potassium 3.9 mmol/L   Chloride 106 mmol/L   CO2 27 mmol/L   Glucose, Bld 138 High  mg/dL   BUN 29 High  mg/dL   Creatinine, Ser 0.90  mg/dL   Calcium 9.4 mg/dL   Total Protein 6.7 g/dL   Albumin 4.1 g/dL   AST 25 U/L   ALT 24 U/L   Alkaline Phosphatase 62 U/L   Total Bilirubin 1.2 mg/dL   GFR, Estimated >60 mL/min   Anion gap 5  CBC [[id number]] (Abnormal)   Collected: 06/25/21 0929   Updated: 06/25/21 0933   Specimen Type: Blood   Specimen Source: Vein    WBC 13.6 High  K/uL   RBC 4.42 MIL/uL   Hemoglobin 14.2 g/dL   HCT 40.9 %   MCV 92.5 fL   MCH 32.1 pg   MCHC 34.7 g/dL   RDW 12.1 %   Platelets 228 K/uL   nRBC 0.0 %   Imaging: CLINICAL DATA:  Thoracic aortic dissection, follow up   EXAM: CT ANGIOGRAPHY CHEST, ABDOMEN AND PELVIS   TECHNIQUE: Non-contrast CT of the chest was initially obtained.   Multidetector CT imaging through the chest, abdomen and pelvis was performed using the standard protocol during bolus administration of intravenous contrast. Multiplanar reconstructed images and MIPs were obtained and reviewed to evaluate the vascular anatomy.   RADIATION DOSE REDUCTION: This exam was performed according to the departmental dose-optimization program which includes automated exposure control, adjustment of the mA and/or kV according to patient size and/or use of iterative reconstruction technique.   CONTRAST:  134m OMNIPAQUE IOHEXOL 350 MG/ML SOLN  COMPARISON:  CT abdomen pelvis dated February 08, 2021; CT chest dated July 20 second, 2022   FINDINGS: CTA CHEST FINDINGS   Cardiovascular: Similar aneurysmal dilation of the ascending thoracic aorta with relative preservation of the sino-tubular junction. It measures approximately 4.1 cm x 4.0 cm, not significantly changed in comparison to prior. No evidence of acute dissection or intramural hematoma. No pericardial effusion. Scattered atherosclerotic calcifications. No acute pulmonary embolism through the proximal segmental pulmonary arteries.   Mediastinum/Nodes: Visualized thyroid is unremarkable. No new axillary or mediastinal  adenopathy. Unchanged appearance of a precarinal lymph node measuring 10 mm in the short axis.   Lungs/Pleura: No pleural effusion or pneumothorax. Unchanged LEFT lower lobe 4 mm pulmonary nodule for greater than 2 years, consistent with a benign etiology (series 7, image 41).   Musculoskeletal: No chest wall abnormality. No acute or significant osseous findings.   Review of the MIP images confirms the above findings.   CTA ABDOMEN AND PELVIS FINDINGS   VASCULAR   Aorta: Normal caliber aorta without aneurysm, dissection, vasculitis or significant stenosis.   Celiac: Patent without evidence of aneurysm, dissection, vasculitis or significant stenosis.   SMA: Patent without evidence of aneurysm, dissection, vasculitis or significant stenosis.   Renals: Both renal arteries are patent without evidence of aneurysm, dissection, vasculitis, fibromuscular dysplasia or significant stenosis.   IMA: Patent without evidence of aneurysm, dissection, vasculitis or significant stenosis.   Inflow: Patent without evidence of aneurysm, dissection, vasculitis or significant stenosis.   Veins: No obvious venous abnormality within the limitations of this arterial phase study.   Review of the MIP images confirms the above findings.   NON-VASCULAR   Hepatobiliary: Likely hepatic steatosis. Cyst at the hepatic dome and porta hepatis (for which no dedicated imaging follow-up is recommended). Gallbladder is unremarkable. No intrahepatic or extrahepatic biliary ductal dilation.   Pancreas: Unremarkable for phase of contrast.   Spleen: Unremarkable for phase of contrast.  Multiple splenules.   Adrenals/Urinary Tract: Adrenals are unremarkable. Kidneys enhance symmetrically. No hydronephrosis. No obstructing nephrolithiasis. Bladder is decompressed.   Stomach/Bowel: There is a small bowel obstruction. There is a gradual taper to the transition point in the lower mid abdomen (series 5,  image [id number]; series 8, image 78-88). There is fecalization of contents within the distal aspect of the small bowel obstruction in the lower abdomen with small volume adjacent fluid and fat stranding (series 5, image 170; series 8, image 62). Scattered diverticulosis. Appendix is normal. There is a small hiatal hernia.   Lymphatic: No new suspicious intra-abdominal lymphadenopathy.   Reproductive: Uterus and bilateral adnexa are unremarkable.   Other: Small volume free fluid. No free air. Fat containing RIGHT inguinal hernia. Postsurgical changes along the anterior abdominal wall.   Musculoskeletal: Grade 1-2 anterolisthesis of L4-5, unchanged and likely due to facet arthropathy.   Review of the MIP images confirms the above findings.   IMPRESSION: 1. Small bowel obstruction with gradual tapering in the lower abdomen. There is a loop of dilated small bowel in the lower mid abdomen with fecalized contents and adjacent small volume free fluid and fat stranding. No definitive evidence bowel ischemia. 2. 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 No evidence of acute aortic injury. Recommend annual imaging followup by CTA or MRA. This recommendation follows 2010 ACCF/AHA/AATS/ACR/ASA/SCA/SCAI/SIR/STS/SVM Guidelines for the Diagnosis and Management of Patients with Thoracic Aortic Disease. Circulation. 2010; 121: K354-S568. Aortic aneurysm NOS (ICD10-I71.9) 3. No acute pulmonary embolism.  Aortic Atherosclerosis (ICD10-I70.0).     Electronically Signed   By: Valentino Saxon M.D.   On: 06/25/2021 10:47  Plan of care: The patient is accepted for admission to Telemetry unit, at [facility]..   Author: Reubin Milan, MD 06/25/2021  Check [email or web address] for on-call coverage.  Nursing staff, Please call [place] number on Amion as soon as patient's arrival, so appropriate admitting  provider can evaluate the pt.

## 2021-06-25 NOTE — Consult Note (Signed)
Cynthia Peters 02/21/1950  [id number].    Requesting MD: Dr. Roslynn Amble, EDP Chief Complaint/Reason for Consult: SBO  HPI:  This is a 71 year old female with a history of multiple ventral hernia repairs with TAR/mesh repair by Dr. Rosendo Gros in 2017.  She has had 3 other prior hernia repairs as well.  Since that time she has had about 7 episodes of bowel obstructions.  Her last admission was in January of this year for the same problem.  She states at around 03 100 this morning she began having crampy abdominal pain.  She had a small amount of nausea but no emesis.  She knew this pain was similar to her prior episodes and presented to the [facility] emergency department.  She underwent evaluation and work-up there with a CT scan revealing a small bowel obstruction with a gradual tapering in the lower abdomen.  There was some fecalized contents noted in the small bowel.  Her last bowel movement was early this morning.  She has continued to pass flatus.  She currently is having no nausea or vomiting.  She does feel mildly distended but states this is chronic for her after all of her hernia repairs.  We have been asked to see her for further evaluation and recommendations.  ROS: ROS: Please see HPI  Family History  Problem Relation Age of Onset   Dementia Mother    Leukemia Child    Cirrhosis Father     Past Medical History:  Diagnosis Date   Anemia    history in the past, not on a regular basis   Arthritis    Bronchitis    Cataracts, bilateral    immature   Claustrophobia    takes Valium daily as needed   Complication of anesthesia    2013 SURGERY vocal cord bothering pt, used smaller ent tube after 2 hernia repair, no problem after . " I woke up during my MRI and colonoscopy."        Depression    takes Wellbutrin daily   Dry eye    Dry mouth    GERD (gastroesophageal reflux disease)    takes Omeprazole daily    H/O hiatal hernia    Headache    migraines with  flashing lights    History of blood clots 40 yrs    leg    History of echocardiogram 2013   a. Echo (05/2011):  Mild LVH, EF 65-70%, no WMA, Gr 1 DD, mild AI.   History of MRSA infection    History of staph infection    History of stress test 2010   a. ETT-Echo in 2010 was normal   History of TIA (transient ischemic attack) 2013   a. Carotid US (05/2011):  No ICA stenosis, pt unsure of this   Hyperlipidemia    takes Simvastatin daily    Hypertension    takes  Losartan daily   IBS (irritable bowel syndrome)    takes Bentyl daily as needed   Insomnia    takes Ambien nightly   Interstitial cystitis    Lichen planus    Mild asthma    Albuterol inhaler as needed   Palpitations    Event monitor in 2007 with rare PVCs // takes Metoprolol daily   PVC (premature ventricular contraction)    Seasonal allergies    takes Claritin daily as needed.Takes Singulair daily as needed.   TIA (transient ischemic attack) 05/27/2011   Urethral stricture  Vertigo    takes Meclizine daily as needed    Past Surgical History:  Procedure Laterality Date   ARTHROTOMY Right 09/21/2020   Procedure: Right elbow open arthrotomy and synovectomy with radial head implant removal and repair reconstruction as necessary including ligamentous repair;  Surgeon: Roseanne Kaufman, MD;  Location: [facility];  Service: Orthopedics;  Laterality: Right;  184mn   CESAREAN SECTION     COLONOSCOPY WITH PROPOFOL N/A 08/10/2012   Procedure: COLONOSCOPY WITH PROPOFOL;  Surgeon: MGarlan Fair MD;  Location: [facility];  Service: Endoscopy;  Laterality: N/A;   CYSTOSCOPY WITH URETHRAL DILATATION N/A 02/18/2013   Procedure: CYSTOSCOPY WITH URETHRAL DILATATION ( NO BALLOON) AND HYDRODISTENSION;  Surgeon: TAlexis Frock MD;  [facility]  Service: Urology;  Laterality: N/A;   ENDOMETRIAL ABLATION W/ HYDROTHERMABLATOR  09-27-2002   INCISIONAL HERNIA REPAIR N/A 10/25/2014   Procedure: HERNIA REPAIR INCISIONAL;   Surgeon: ARalene Ok MD;  [place]  Service: General;  Laterality: N/A;   [place] 03/26/2015   Procedure: LAPAROSCOPIC INCISIONAL HERNIA;  Surgeon: ARalene Ok MD;  [place] [facility]  Service: General;  Laterality: N/A;   INSERTION OF MESH N/A 10/25/2014   Procedure: INSERTION OF MESH;  Surgeon: ARalene Ok MD;  [place] [facility]  Service: General;  Laterality: N/A;   LAPAROSCOPIC LYSIS OF ADHESIONS N/A 03/26/2015   Procedure: LAPAROSCOPIC LYSIS OF ADHESIONS;  Surgeon: ARalene Ok MD;  [place] [facility]  Service: General;  Laterality: N/A;   LAPAROTOMY N/A 10/25/2014   Procedure: EXPLORATORY LAPAROTOMY;  Surgeon: ARalene Ok MD;  [place] [facility]  Service: General;  Laterality: N/A;   LYSIS OF ADHESION N/A 10/25/2014   Procedure: LYSIS OF ADHESIONS ;  Surgeon: ARalene Ok MD;  [place] [facility]  Service: General;  Laterality: N/A;   MUCOSAL ADVANCEMENT FLAP N/A 10/25/2014   Procedure: MUCOSAL ADVANCEMENT FLAP;  Surgeon: ARalene Ok MD;  [place] [facility]  Service: General;  Laterality: N/A;   ORIF RADIAL FRACTURE Right 06/17/2016   Procedure: right radial head arthroplasty with ligament repair, reconstruciton;  Surgeon: GRoseanne Kaufman MD;  [place] [facility] [facility]  Service: Orthopedics;  Laterality: Right;   RADIAL HEAD ARTHROPLASTY Right 06/17/2016   TOOTH EXTRACTION     TRANSTHORACIC ECHOCARDIOGRAM  05-26-2011   MILD LVH/  EF 674-08%/ GRADE I DIASTOLIC DYSFUNCTION/  MILD AR//   06-20-2008  NOMRAL STRESS ECHO   UMBILICAL HERNIA REPAIR  2013   AND REVISION THE SAME YEAR W/ MESH   WISDOM TOOTH EXTRACTION  AGE 32    Social History:  reports that she quit smoking about 45 years ago. Her smoking use included cigarettes. She has a 0.50 pack-year smoking history. She has never used smokeless tobacco. She reports current alcohol use. She reports that she does not use drugs.  Allergies:  Allergies  Allergen Reactions   Hydrocodone Rash    Hydrocodone-Acetaminophen Rash   Tramadol Hives and Palpitations    Medications Prior to Admission  Medication Sig Dispense Refill   acetaminophen (TYLENOL) 500 MG tablet Take 1,000 mg by mouth every 6 (six) hours as needed (pain).      albuterol (VENTOLIN HFA) 108 (90 Base) MCG/ACT inhaler Inhale 2 puffs into the lungs every 6 (six) hours as needed for wheezing or shortness of breath.     amLODipine (NORVASC) 5 MG tablet Take 5 mg by mouth daily.     aspirin 81 MG chewable tablet Chew 81 mg by mouth daily.  buPROPion (WELLBUTRIN XL) 300 MG 24 hr tablet Take 300 mg by mouth daily.     Cholecalciferol (VITAMIN D) 2000 units CAPS Take 2,000 Units by mouth daily.     clobetasol cream (TEMOVATE) 5.46 % Apply 1 application topically 2 (two) times daily as needed (rash).   7   Coenzyme Q10 (CO Q 10) 100 MG CAPS Take 100 mg by mouth daily.      [employer] VITAMIN C 1000 MG tablet Take 2,000 mg by mouth 2 (two) times daily.  1   Evening Primrose Oil 1000 MG CAPS Take 1,000 mg by mouth daily as needed (dark spots).     fexofenadine (ALLEGRA) 180 MG tablet Take 180 mg by mouth daily.     Flaxseed, Linseed, 1000 MG CAPS Take 1,000 mg by mouth daily.     fluticasone (FLONASE) 50 MCG/ACT nasal spray Place 2 sprays into both nostrils daily as needed for allergies or rhinitis.     hydrochlorothiazide 25 MG tablet Take 25 mg by mouth daily.     hydrocortisone cream 1 % Apply 1 application topically 4 (four) times daily as needed for itching.     ibuprofen (ADVIL) 200 MG tablet Take 200-400 mg by mouth every 6 (six) hours as needed for mild pain.     losartan (COZAAR) 100 MG tablet TAKE 1 TABLET BY MOUTH EVERY DAY (Patient taking differently: Take 100 mg by mouth daily.) 90 tablet 3   magnesium oxide (MAG-OX) 400 MG tablet Take 400 mg by mouth daily.     meclizine (ANTIVERT) 25 MG tablet Take 1 tablet (25 mg total) by mouth 3 (three) times daily as needed for dizziness. 30 tablet 0   metoprolol succinate  (TOPROL-XL) 100 MG 24 hr tablet Take 1 tablet (100 mg total) by mouth 2 (two) times daily. Take with or immediately following a meal. Take 100 mg by mouth 2 (two) times daily. Take with or immediately following a meal. (Patient taking differently: Take 100 mg by mouth 2 (two) times daily. Take with or immediately following a meal.) 30 tablet 1   montelukast (SINGULAIR) 10 MG tablet Take 10 mg by mouth at bedtime.     Multiple Vitamins-Minerals (MULTIPLE VITAMINS/WOMENS PO) Take 1 tablet by mouth daily.     mupirocin ointment (BACTROBAN) 2 % Apply 1 application topically 3 (three) times daily as needed for rash.     pantoprazole (PROTONIX) 20 MG tablet Take 20 mg by mouth daily.     Polyethyl Glyc-Propyl Glyc PF (SYSTANE ULTRA PF) 0.4-0.3 % SOLN Place 1 drop into both eyes 4 (four) times daily as needed (Dry eye).     polyethylene glycol (MIRALAX / GLYCOLAX) 17 g packet Take 17 g by mouth 2 (two) times daily. (Patient taking differently: Take 17 g by mouth daily.) 14 each 0   potassium chloride SA (KLOR-CON M20) 20 MEQ tablet TAKE 1&1/2 TABLETS BY MOUTH DAILY. (Patient taking differently: Take 30 mEq by mouth daily.) 135 tablet 3   pravastatin (PRAVACHOL) 40 MG tablet Take 40 mg by mouth daily.     Probiotic Product (ALIGN PO) Take 1 capsule by mouth 2 (two) times daily.     valACYclovir (VALTREX) 500 MG tablet INCREASE TO TAKING 1 TABLET 2 TIMES A DAY FOR 3 DAYS WITH OUTBREAK (Patient taking differently: Take 500 mg by mouth daily.) 30 tablet 0     Physical Exam: Blood pressure (!) 148/66, pulse (!) 58, temperature 98.2 F (36.8 C), temperature source Oral, resp. rate 16,  height '5\' 4"'$  (1.626 m), weight 68 kg, last menstrual period 02/04/2004, SpO2 100 %. General: pleasant, WD, WN white female who is laying in bed in NAD HEENT: head is normocephalic, atraumatic.  Sclera are noninjected.  PERRL.  Ears and nose without any masses or lesions.  Mouth is pink and moist Heart: regular, rate, and rhythm.   Palpable radial and pedal pulses bilaterally Lungs: Respiratory effort nonlabored Abd: soft, NT, mild distention, no masses, hernias, or organomegaly, scars noted from previous surgeries. MS: all 4 extremities are symmetrical with no cyanosis, clubbing, or edema. Skin: warm and dry with no masses, lesions, or rashes Neuro: Cranial nerves 2-12 grossly intact, sensation is normal throughout Psych: A&Ox3 with an appropriate affect.   Results for orders placed or performed during the hospital encounter of 06/25/21 (from the past 48 hour(s))  Lipase, blood     Status: None   Collection Time: 06/25/21  9:29 AM  Result Value Ref Range   Lipase 31 11 - 51 U/L    Comment: Performed at [facility], [address] [place], [place] [address]  Comprehensive metabolic panel     Status: Abnormal   Collection Time: 06/25/21  9:29 AM  Result Value Ref Range   Sodium 138 135 - 145 mmol/L   Potassium 3.9 3.5 - 5.1 mmol/L   Chloride 106 98 - 111 mmol/L   CO2 27 22 - 32 mmol/L   Glucose, Bld 138 (H) 70 - 99 mg/dL    Comment: Glucose reference range applies only to samples taken after fasting for at least 8 hours.   BUN 29 (H) 8 - 23 mg/dL   Creatinine, Ser 0.90 0.44 - 1.00 mg/dL   Calcium 9.4 8.9 - 10.3 mg/dL   Total Protein 6.7 6.5 - 8.1 g/dL   Albumin 4.1 3.5 - 5.0 g/dL   AST 25 15 - 41 U/L   ALT 24 0 - 44 U/L   Alkaline Phosphatase 62 38 - 126 U/L   Total Bilirubin 1.2 0.3 - 1.2 mg/dL   GFR, Estimated >60 >60 mL/min    Comment: (NOTE) Calculated using the CKD-EPI Creatinine Equation (2021)    Anion gap 5 5 - 15    Comment: Performed at [facility], [address] [place], [place] [address]  CBC     Status: Abnormal   Collection Time: 06/25/21  9:29 AM  Result Value Ref Range   WBC 13.6 (H) 4.0 - 10.5 K/uL   RBC 4.42 3.87 - 5.11 MIL/uL   Hemoglobin 14.2 12.0 - 15.0 g/dL   HCT 40.9 36.0 - 46.0 %   MCV 92.5 80.0 - 100.0 fL   MCH 32.1 26.0 - 34.0 pg   MCHC 34.7  30.0 - 36.0 g/dL   RDW 12.1 11.5 - 15.5 %   Platelets 228 150 - 400 K/uL   nRBC 0.0 0.0 - 0.2 %    Comment: Performed at [facility], [address] [place], [place] [address]  Urinalysis, Routine w reflex microscopic     Status: Abnormal   Collection Time: 06/25/21  9:29 AM  Result Value Ref Range   Color, Urine YELLOW YELLOW   APPearance CLEAR CLEAR   Specific Gravity, Urine 1.025 1.005 - 1.030   pH 6.5 5.0 - 8.0   Glucose, UA NEGATIVE NEGATIVE mg/dL   Hgb urine dipstick NEGATIVE NEGATIVE   Bilirubin Urine NEGATIVE NEGATIVE   Ketones, ur NEGATIVE NEGATIVE mg/dL  Protein, ur NEGATIVE NEGATIVE mg/dL   Nitrite NEGATIVE NEGATIVE   Leukocytes,Ua TRACE (A) NEGATIVE    Comment: Performed at [facility], [address] [place], [place] [address]  Urinalysis, Microscopic (reflex)     Status: Abnormal   Collection Time: 06/25/21  9:29 AM  Result Value Ref Range   RBC / HPF 0-5 0 - 5 RBC/hpf   WBC, UA 0-5 0 - 5 WBC/hpf   Bacteria, UA RARE (A) NONE SEEN   Squamous Epithelial / LPF 0-5 0 - 5   Mucus PRESENT    Amorphous Crystal PRESENT     Comment: Performed at [facility], [address] [place], [place] [address]  Magnesium     Status: None   Collection Time: 06/25/21 11:56 AM  Result Value Ref Range   Magnesium 1.9 1.7 - 2.4 mg/dL    Comment: Performed at [facility], [address] [place], [place] [address]   CT Angio Chest/Abd/Pel for Dissection W and/or Wo Contrast  Result Date: 06/25/2021 CLINICAL DATA:  Thoracic aortic dissection, follow up EXAM: CT ANGIOGRAPHY CHEST, ABDOMEN AND PELVIS TECHNIQUE: Non-contrast CT of the chest was initially obtained. Multidetector CT imaging through the chest, abdomen and pelvis was performed using the standard protocol during bolus administration of intravenous contrast. Multiplanar reconstructed images and MIPs were obtained and reviewed to evaluate the vascular anatomy. RADIATION DOSE  REDUCTION: This exam was performed according to the departmental dose-optimization program which includes automated exposure control, adjustment of the mA and/or kV according to patient size and/or use of iterative reconstruction technique. CONTRAST:  143m OMNIPAQUE IOHEXOL 350 MG/ML SOLN COMPARISON:  CT abdomen pelvis dated February 08, 2021; CT chest dated July 20 second, 2022 FINDINGS: CTA CHEST FINDINGS Cardiovascular: Similar aneurysmal dilation of the ascending thoracic aorta with relative preservation of the sino-tubular junction. It measures approximately 4.1 cm x 4.0 cm, not significantly changed in comparison to prior. No evidence of acute dissection or intramural hematoma. No pericardial effusion. Scattered atherosclerotic calcifications. No acute pulmonary embolism through the proximal segmental pulmonary arteries. Mediastinum/Nodes: Visualized thyroid is unremarkable. No new axillary or mediastinal adenopathy. Unchanged appearance of a precarinal lymph node measuring 10 mm in the short axis. Lungs/Pleura: No pleural effusion or pneumothorax. Unchanged LEFT lower lobe 4 mm pulmonary nodule for greater than 2 years, consistent with a benign etiology (series 7, image 41). Musculoskeletal: No chest wall abnormality. No acute or significant osseous findings. Review of the MIP images confirms the above findings. CTA ABDOMEN AND PELVIS FINDINGS VASCULAR Aorta: Normal caliber aorta without aneurysm, dissection, vasculitis or significant stenosis. Celiac: Patent without evidence of aneurysm, dissection, vasculitis or significant stenosis. SMA: Patent without evidence of aneurysm, dissection, vasculitis or significant stenosis. Renals: Both renal arteries are patent without evidence of aneurysm, dissection, vasculitis, fibromuscular dysplasia or significant stenosis. IMA: Patent without evidence of aneurysm, dissection, vasculitis or significant stenosis. Inflow: Patent without evidence of aneurysm, dissection,  vasculitis or significant stenosis. Veins: No obvious venous abnormality within the limitations of this arterial phase study. Review of the MIP images confirms the above findings. NON-VASCULAR Hepatobiliary: Likely hepatic steatosis. Cyst at the hepatic dome and porta hepatis (for which no dedicated imaging follow-up is recommended). Gallbladder is unremarkable. No intrahepatic or extrahepatic biliary ductal dilation. Pancreas: Unremarkable for phase of contrast. Spleen: Unremarkable for phase of contrast.  Multiple splenules. Adrenals/Urinary Tract: Adrenals are unremarkable. Kidneys enhance symmetrically. No hydronephrosis. No obstructing nephrolithiasis. Bladder is decompressed. Stomach/Bowel: There is  a small bowel obstruction. There is a gradual taper to the transition point in the lower mid abdomen (series 5, image [id number]; series 8, image 78-88). There is fecalization of contents within the distal aspect of the small bowel obstruction in the lower abdomen with small volume adjacent fluid and fat stranding (series 5, image 170; series 8, image 62). Scattered diverticulosis. Appendix is normal. There is a small hiatal hernia. Lymphatic: No new suspicious intra-abdominal lymphadenopathy. Reproductive: Uterus and bilateral adnexa are unremarkable. Other: Small volume free fluid. No free air. Fat containing RIGHT inguinal hernia. Postsurgical changes along the anterior abdominal wall. Musculoskeletal: Grade 1-2 anterolisthesis of L4-5, unchanged and likely due to facet arthropathy. Review of the MIP images confirms the above findings. IMPRESSION: 1. Small bowel obstruction with gradual tapering in the lower abdomen. There is a loop of dilated small bowel in the lower mid abdomen with fecalized contents and adjacent small volume free fluid and fat stranding. No definitive evidence bowel ischemia. 2. 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 No evidence of acute  aortic injury. Recommend annual imaging followup by CTA or MRA. This recommendation follows 2010 ACCF/AHA/AATS/ACR/ASA/SCA/SCAI/SIR/STS/SVM Guidelines for the Diagnosis and Management of Patients with Thoracic Aortic Disease. Circulation. 2010; 121: L275-T700. Aortic aneurysm NOS (ICD10-I71.9) 3. No acute pulmonary embolism. Aortic Atherosclerosis (ICD10-I70.0). Electronically Signed   By: Valentino Saxon M.D.   On: 06/25/2021 10:47      Assessment/Plan Recurrent small bowel obstruction status post multiple prior ventral hernia repairs The patient has been seen, examined, chart reviewed, vitals and labs during this stay have been reviewed as well.  The patient appears to have a small bowel obstruction likely secondary to adhesive disease.  She is however continuing to have bowel movements and passing flatus.  Her last several admissions have proven to resolve her obstruction fairly quickly.  Given she is no longer having any nausea or vomiting, we will defer an NG tube at this point.  She is aware that if she begins to vomit she will need an NG tube placed.  In the interim we will start the small bowel obstruction protocol without an NG tube and have her drink the Gastrografin.  We will follow-up on her delayed 8-hour film tomorrow morning.  The patient does not need any emergent surgical intervention at this time and will hopefully resolve quickly.  She agrees with this plan.   FEN -NPO, IV fluids VTE -okay for chemical prophylaxis from surgical standpoint ID -none  AAA GERD HTN HLD History of TIA  I reviewed ED provider notes, hospitalist notes, last 24 h vitals and pain scores, last 48 h intake and output, last 24 h labs and trends, and last 24 h imaging results.  Henreitta Cea, [facility] Surgery 06/25/2021, 4:44 PM Please see Amion for pager number during day hours 7:00am-4:30pm or 7:00am -11:30am on weekends

## 2021-06-25 NOTE — H&P (Signed)
History and Physical    Patient: Cynthia Peters CHE:[id number] DOB: Nov 12, 1950 DOA: 06/25/2021 DOS: the patient was seen and examined on 06/25/2021 PCP: Aletha Halim., PA-C  Patient coming from: Home  Chief Complaint:  Chief Complaint  Patient presents with   Abdominal Pain   HPI: Cynthia Peters is a 71 y.o. female with medical history significant of anemia in the past, osteoarthritis, bronchitis, bilateral cataracts, claustrophobia, depression, dry eyes, dry mouth, GERD, hiatal hernia, migraine headaches, history of DVT, history of MRSA infection, history of TIA, history of diabetes, insomnia, interstitial cystitis, lichen planus, mild asthma, palpitations, history of PVCs, seasonal allergies, TIA, ureteral stricture, vertigo who presented to the emergency department with complaints of colicky abdominal pain associated with mild nausea, but no nausea similar to her previous SBO symptomatology.  No diarrhea, constipation, melena or hematochezia.  No flank pain, dysuria, frequency or hematuria.  No fever, chills, sore throat rhinorrhea.  No chest pain, dyspnea, recent palpitations, lightheadedness, PND orthopnea.  ED course: Initial vital signs were temperature 98.2 F, pulse 57, respiration 18, BP 157/62 mmHg O2 sat 99% on room air.  The patient received 1000 mL of normal saline bolus, fentanyl 50 mcg IVP and ondansetron 4 mg IVP.  General surgery was consulted and requested medical admission.  Lab work: Urinalysis with trace leukocyte esterase and rare bacteria microscopic examination but otherwise normal.  CBC showed a white count 13.6, hemoglobin 14.2 g/dL platelets 229.  Lipase was normal.  CMP showed a glucose of 138 and BUN of 29 mg/dL, the rest of the CMP values were unremarkable.  Magnesium was 1.9 mg/dL.  Imaging: CTA chest/abdomen/pelvis with contrast shows small bowel obstruction with gradual tapering in the lower abdomen.  Unchanged appearance of mild aneurysmal  dilatation of the ascending aorta.  There was aortic atherosclerosis.   Review of Systems: As mentioned in the history of present illness. All other systems reviewed and are negative.  Past Medical History:  Diagnosis Date   Anemia    history in the past, not on a regular basis   Arthritis    Bronchitis    Cataracts, bilateral    immature   Claustrophobia    takes Valium daily as needed   Complication of anesthesia    2013 SURGERY vocal cord bothering pt, used smaller ent tube after 2 hernia repair, no problem after . " I woke up during my MRI and colonoscopy."        Depression    takes Wellbutrin daily   Dry eye    Dry mouth    GERD (gastroesophageal reflux disease)    takes Omeprazole daily    H/O hiatal hernia    Headache    migraines with flashing lights    History of blood clots 40 yrs    leg    History of echocardiogram 2013   a. Echo (05/2011):  Mild LVH, EF 65-70%, no WMA, Gr 1 DD, mild AI.   History of MRSA infection    History of staph infection    History of stress test 2010   a. ETT-Echo in 2010 was normal   History of TIA (transient ischemic attack) 2013   a. Carotid US (05/2011):  No ICA stenosis, pt unsure of this   Hyperlipidemia    takes Simvastatin daily    Hypertension    takes  Losartan daily   IBS (irritable bowel syndrome)    takes Bentyl daily as needed   Insomnia    takes Ambien  nightly   Interstitial cystitis    Lichen planus    Mild asthma    Albuterol inhaler as needed   Palpitations    Event monitor in 2007 with rare PVCs // takes Metoprolol daily   PVC (premature ventricular contraction)    Seasonal allergies    takes Claritin daily as needed.Takes Singulair daily as needed.   TIA (transient ischemic attack) 05/27/2011   Urethral stricture    Vertigo    takes Meclizine daily as needed   Past Surgical History:  Procedure Laterality Date   ARTHROTOMY Right 09/21/2020   Procedure: Right elbow open arthrotomy and synovectomy with  radial head implant removal and repair reconstruction as necessary including ligamentous repair;  Surgeon: Roseanne Kaufman, MD;  Location: [facility];  Service: Orthopedics;  Laterality: Right;  1109mn   CESAREAN SECTION     COLONOSCOPY WITH PROPOFOL N/A 08/10/2012   Procedure: COLONOSCOPY WITH PROPOFOL;  Surgeon: MGarlan Fair MD;  Location: [facility];  Service: Endoscopy;  Laterality: N/A;   CYSTOSCOPY WITH URETHRAL DILATATION N/A 02/18/2013   Procedure: CYSTOSCOPY WITH URETHRAL DILATATION ( NO BALLOON) AND HYDRODISTENSION;  Surgeon: TAlexis Frock MD;  [facility]  Service: Urology;  Laterality: N/A;   ENDOMETRIAL ABLATION W/ HYDROTHERMABLATOR  09-27-2002   INCISIONAL HERNIA REPAIR N/A 10/25/2014   Procedure: HERNIA REPAIR INCISIONAL;  Surgeon: ARalene Ok MD;  [place]  Service: General;  Laterality: N/A;   [place] 03/26[place]   Procedure: LAPAROSCOPIC INCISIONAL HERNIA;  Surgeon: ARalene Ok MD;  [place]  Service: General;  Laterality: N/A;   INSERTION OF MESH N/A 10/25/2014   Procedure: INSERTION OF MESH;  Surgeon: ARalene Ok MD;  [place] [facility]  Service: General;  Laterality: N/A;   LAPAROSCOPIC LYSIS OF ADHESIONS N/A 03/26/2015   Procedure: LAPAROSCOPIC LYSIS OF ADHESIONS;  Surgeon: ARalene Ok MD;  [place] [facility] [place]  Service: General;  Laterality: N/A;   LAPAROTOMY N/A 10/25/2014   Procedure: EXPLORATORY LAPAROTOMY;  Surgeon: ARalene Ok MD;  [place]  Service: General;  Laterality: N/A;   LYSIS OF ADHESION N/A 10/25/2014   Procedure: LYSIS OF ADHESIONS ;  Surgeon: ARalene Ok MD;  [place]  Service: General;  Laterality: N/A;   MUCOSAL ADVANCEMENT FLAP N/A 10/25/2014   Procedure: MUCOSAL ADVANCEMENT FLAP;  Surgeon: ARalene Ok MD;  [place] [facility]  Service: General;  Laterality: N/A;   ORIF RADIAL FRACTURE Right 06/17/2016   Procedure: right radial head arthroplasty with  ligament repair, reconstruciton;  Surgeon: GRoseanne Kaufman MD;  [place] [facility]  Service: Orthopedics;  Laterality: Right;   RADIAL HEAD ARTHROPLASTY Right 06/17/2016   TOOTH EXTRACTION     TRANSTHORACIC ECHOCARDIOGRAM  05-26-2011   MILD LVH/  EF 620-25%/ GRADE I DIASTOLIC DYSFUNCTION/  MILD AR//   06-20-2008  NOMRAL STRESS ECHO   UMBILICAL HERNIA REPAIR  2013   AND REVISION THE SAME YEAR W/ MESH   WISDOM TOOTH EXTRACTION  AGE 27   Social History:  reports that she quit smoking about 45 years ago. Her smoking use included cigarettes. She has a 0.50 pack-year smoking history. She has never used smokeless tobacco. She reports current alcohol use. She reports that she does not use drugs.  Allergies  Allergen Reactions   Hydrocodone Rash   Hydrocodone-Acetaminophen Rash   Tramadol Hives and Palpitations    Family History  Problem Relation Age of Onset   Dementia Mother    Leukemia Child  Cirrhosis Father     Prior to Admission medications   Medication Sig Start Date End Date Taking? Authorizing Provider  acetaminophen (TYLENOL) 500 MG tablet Take 1,000 mg by mouth every 6 (six) hours as needed (pain).    Yes [provider]  albuterol (VENTOLIN HFA) 108 (90 Base) MCG/ACT inhaler Inhale 2 puffs into the lungs every 6 (six) hours as needed for wheezing or shortness of breath.   Yes [provider]  amLODipine (NORVASC) 5 MG tablet Take 5 mg by mouth daily. 05/21/20  Yes [provider]  aspirin 81 MG chewable tablet Chew 81 mg by mouth daily.   Yes [provider]  buPROPion (WELLBUTRIN XL) 300 MG 24 hr tablet Take 300 mg by mouth daily.   Yes [provider]  Cholecalciferol (VITAMIN D) 2000 units CAPS Take 2,000 Units by mouth daily.   Yes [provider]  clobetasol cream (TEMOVATE) 3.66 % Apply 1 application topically 2 (two) times daily as needed (rash).  04/01/16  Yes [provider]  Coenzyme Q10 (CO Q 10) 100 MG CAPS  Take 100 mg by mouth daily.    Yes [provider]  [employer]S VITAMIN C 1000 MG tablet Take 2,000 mg by mouth 2 (two) times daily. 06/13/16  Yes [provider]  Evening Primrose Oil 1000 MG CAPS Take 1,000 mg by mouth daily as needed (complextion).   Yes [provider]  fexofenadine (ALLEGRA) 180 MG tablet Take 180 mg by mouth daily.   Yes [provider]  Flaxseed, Linseed, 1000 MG CAPS Take 1,000 mg by mouth daily.   Yes [provider]  fluticasone (FLONASE) 50 MCG/ACT nasal spray Place 2 sprays into both nostrils daily as needed for allergies or rhinitis.   Yes [provider]  hydrochlorothiazide 25 MG tablet Take 25 mg by mouth daily.   Yes [provider]  hydrocortisone cream 1 % Apply 1 application topically 4 (four) times daily as needed for itching.   Yes [provider]  losartan (COZAAR) 100 MG tablet TAKE 1 TABLET BY MOUTH EVERY DAY Patient taking differently: Take 100 mg by mouth daily. 09/14/17  Yes [place], Peter M, MD  magnesium oxide (MAG-OX) 400 MG tablet Take 400 mg by mouth daily.   Yes [provider]  meclizine (ANTIVERT) 25 MG tablet Take 1 tablet (25 mg total) by mouth 3 (three) times daily as needed for dizziness. 05/02/20  Yes Robinson, [place] N, PA-C  metoprolol succinate (TOPROL-XL) 100 MG 24 hr tablet Take 1 tablet (100 mg total) by mouth 2 (two) times daily. Take with or immediately following a meal. Take 100 mg by mouth 2 (two) times daily. Take with or immediately following a meal. Patient taking differently: Take 100 mg by mouth 2 (two) times daily. Take with or immediately following a meal. 07/14/18  Yes [place], Peter M, MD  montelukast (SINGULAIR) 10 MG tablet Take 10 mg by mouth at bedtime.   Yes [provider]  Multiple Vitamins-Minerals (MULTIPLE VITAMINS/WOMENS PO) Take 1 tablet by mouth daily.   Yes [provider]  mupirocin ointment (BACTROBAN) 2 % Apply 1 application  topically 3 (three) times daily as needed for rash. 06/20/20  Yes [provider]  pantoprazole (PROTONIX) 20 MG tablet Take 20 mg by mouth daily. 05/21/20  Yes [provider]  Polyethyl Glyc-Propyl Glyc PF (SYSTANE ULTRA PF) 0.4-0.3 % SOLN Place 1 drop into both eyes 4 (four) times daily as needed (Dry eye).  Yes [provider]  polyethylene glycol (MIRALAX / GLYCOLAX) 17 g packet Take 17 g by mouth 2 (two) times daily. Patient taking differently: Take 17 g by mouth daily. 11/30/19  Yes Ghimire, Dante Gang, MD  potassium chloride SA (KLOR-CON M20) 20 MEQ tablet TAKE 1&1/2 TABLETS BY MOUTH DAILY. Patient taking differently: Take 30 mEq by mouth daily. 02/16/20  Yes [place], Peter M, MD  pravastatin (PRAVACHOL) 40 MG tablet Take 40 mg by mouth daily. 05/21/20  Yes [provider]  Probiotic Product (ALIGN PO) Take 1 capsule by mouth 2 (two) times daily.   Yes [provider]  valACYclovir (VALTREX) 500 MG tablet INCREASE TO TAKING 1 TABLET 2 TIMES A DAY FOR 3 DAYS WITH OUTBREAK Patient taking differently: Take 500 mg by mouth daily. 03/25/16  Marijean Bravo, FNP    Physical Exam: Vitals:   06/25/21 0905 06/25/21 1109 06/25/21 1330 06/25/21 1530  BP: (!) 157/62 (!) 156/63 (!) 147/64 (!) 148/66  Pulse: (!) 57 65 64 (!) 58  Resp: '18 18 11 16  '$ Temp: 98.2 F (36.8 C) 98.3 F (36.8 C) 98.3 F (36.8 C) 98.2 F (36.8 C)  TempSrc: Oral Oral  Oral  SpO2: 99% 96% 100% 100%  Weight:      Height:       Physical Exam Vitals and nursing note reviewed.  Constitutional:      General: She is awake.     Appearance: She is well-developed and normal weight.  HENT:     Head: Normocephalic.     Mouth/Throat:     Mouth: Mucous membranes are dry.  Eyes:     General: No scleral icterus.    Pupils: Pupils are equal, round, and reactive to light.  Neck:     Vascular: No JVD.  Cardiovascular:     Rate and Rhythm: Normal rate and regular rhythm.     Heart  sounds: S1 normal and S2 normal.  Pulmonary:     Effort: Pulmonary effort is normal.     Breath sounds: Normal breath sounds. No wheezing, rhonchi or rales.  Abdominal:     General: Abdomen is flat. A surgical scar is present. Bowel sounds are normal. There is distension.     Palpations: Abdomen is soft.     Tenderness: There is abdominal tenderness. There is no right CVA tenderness, left CVA tenderness, guarding or rebound.  Musculoskeletal:     Cervical back: Neck supple.     Right lower leg: No edema.     Left lower leg: No edema.  Skin:    General: Skin is warm and dry.  Neurological:     General: No focal deficit present.     Mental Status: She is alert and oriented to person, place, and time.  Psychiatric:        Mood and Affect: Mood normal.        Behavior: Behavior normal.   Data Reviewed:  There are no new results to review at this time.  Assessment and Plan: Principal Problem:   SBO (small bowel obstruction) (HCC) Observation/telemetry. Keep NPO. Continue IV fluids. Analgesics as needed. Antiemetics as needed. Keep electrolytes optimized. Follow-up CBC and CMP in AM. Follow-up imaging in the morning. General surgery input appreciated.   Active Problems:   PVC's (history of premature ventricular contractions) Keep electrolytes optimized. Parenteral metoprolol while NPO.    Hypertension Metoprolol 5 mg IVP every 6 hours. Resume oral metoprolol once cleared for oral intake.    GERD (gastroesophageal  reflux disease) Pantoprazole 40 mg IVP daily.    Thoracic aortic aneurysm [facility]) Continue regular imaging surveillance.    Hyperlipidemia   Aortic atherosclerosis (HCC) Continue pravastatin once cleared for oral intake.    Advance Care Planning:   Code Status: Full Code   Consults: General surgery Michael Boston, MD)  Family Communication:   Severity of Illness: The appropriate patient status for this patient is OBSERVATION. Observation status is  judged to be reasonable and necessary in order to provide the required intensity of service to ensure the patient's safety. The patient's presenting symptoms, physical exam findings, and initial radiographic and laboratory data in the context of their medical condition is felt to place them at decreased risk for further clinical deterioration. Furthermore, it is anticipated that the patient will be medically stable for discharge from the hospital within 2 midnights of admission.   Author: Reubin Milan, MD 06/25/2021 4:04 PM  For on call review [email or web address].   This document was prepared using [employer] and may contain some unintended transcription errors.

## 2021-06-25 NOTE — ED Notes (Signed)
[facility] Transport Team at bedside 

## 2021-06-25 NOTE — ED Notes (Signed)
ED TO INPATIENT HANDOFF REPORT  ED Nurse Name and Phone #: Baxter Flattery, RN  S Name/Age/Gender Cynthia Peters 71 y.o. female Room/Bed: MH04/MH04  Code Status   Code Status: Prior  Home/SNF/Other Home Patient oriented to: self, place, time, and situation Is this baseline? Yes   Triage Complete: Triage complete  Chief Complaint SBO (small bowel obstruction) ([place]) [L87.564]  Triage Note C/O abdominal pain started around 4 am; w/ some nausea. Reported hx of 4 hernia and abdominal surgeries, as well as stable aortic aneurysm.    Allergies Allergies  Allergen Reactions   Hydrocodone Rash   Hydrocodone-Acetaminophen Rash   Tramadol Palpitations    Level of Care/Admitting Diagnosis ED Disposition     ED Disposition  Admit   Condition  --   Comment  Hospital Area: [place] [[id number]]  Level of Care: Telemetry [5]  Admit to tele based on following criteria: Monitor for Ischemic changes  Interfacility transfer: Yes  May place patient in observation at [facility] or [place] if equivalent level of care is available:: No  Covid Evaluation: Asymptomatic - no recent exposure (last 10 days) testing not required  Diagnosis: SBO (small bowel obstruction) [facility]) [[id number]]  Admitting Physician: Reubin Milan [[id number]]  Attending Physician: Reubin Milan [[id number]]          B Medical/Surgery History Past Medical History:  Diagnosis Date   Anemia    history in the past, not on a regular basis   Arthritis    Bronchitis    Cataracts, bilateral    immature   Claustrophobia    takes Valium daily as needed   Complication of anesthesia    2013 SURGERY vocal cord bothering pt, used smaller ent tube after 2 hernia repair, no problem after . " I woke up during my MRI and colonoscopy."        Depression    takes Wellbutrin daily   Dry eye    Dry mouth    GERD (gastroesophageal reflux disease)    takes Omeprazole daily    H/O hiatal hernia     Headache    migraines with flashing lights    History of blood clots 40 yrs    leg    History of echocardiogram 2013   a. Echo (05/2011):  Mild LVH, EF 65-70%, no WMA, Gr 1 DD, mild AI.   History of MRSA infection    History of staph infection    History of stress test 2010   a. ETT-Echo in 2010 was normal   History of TIA (transient ischemic attack) 2013   a. Carotid US (05/2011):  No ICA stenosis, pt unsure of this   Hyperlipidemia    takes Simvastatin daily    Hypertension    takes  Losartan daily   IBS (irritable bowel syndrome)    takes Bentyl daily as needed   Insomnia    takes Ambien nightly   Interstitial cystitis    Lichen planus    Mild asthma    Albuterol inhaler as needed   Palpitations    Event monitor in 2007 with rare PVCs // takes Metoprolol daily   PVC (premature ventricular contraction)    Seasonal allergies    takes Claritin daily as needed.Takes Singulair daily as needed.   TIA (transient ischemic attack) 05/27/2011   Urethral stricture    Vertigo    takes Meclizine daily as needed   Past Surgical History:  Procedure Laterality Date   ARTHROTOMY Right 09/21/2020  Procedure: Right elbow open arthrotomy and synovectomy with radial head implant removal and repair reconstruction as necessary including ligamentous repair;  Surgeon: Roseanne Kaufman, MD;  Location: [place];  Service: Orthopedics;  Laterality: Right;  132mn   CESAREAN SECTION     COLONOSCOPY WITH PROPOFOL N/A 08/10/2012   Procedure: COLONOSCOPY WITH PROPOFOL;  Surgeon: MGarlan Fair MD;  Location: [facility];  Service: Endoscopy;  Laterality: N/A;   CYSTOSCOPY WITH URETHRAL DILATATION N/A 02/18/2013   Procedure: CYSTOSCOPY WITH URETHRAL DILATATION ( NO BALLOON) AND HYDRODISTENSION;  Surgeon: TAlexis Frock MD;  [facility]  Service: Urology;  Laterality: N/A;   ENDOMETRIAL ABLATION W/ HYDROTHERMABLATOR  09-27-2002   INCISIONAL HERNIA REPAIR N/A 10/25/2014   Procedure:  HERNIA REPAIR INCISIONAL;  Surgeon: ARalene Ok MD;  [place]  Service: General;  Laterality: N/A;   [place] 03/[place]   Procedure: LAPAROSCOPIC INCISIONAL HERNIA;  Surgeon: ARalene Ok MD;  [place]  Service: General;  Laterality: N/A;   INSERTION OF MESH N/A 10/25/2014   Procedure: INSERTION OF MESH;  Surgeon: ARalene Ok MD;  [place]  Service: General;  Laterality: N/A;   LAPAROSCOPIC LYSIS OF ADHESIONS N/A 03/26/2015   Procedure: LAPAROSCOPIC LYSIS OF ADHESIONS;  Surgeon: ARalene Ok MD;  [place]  Service: General;  Laterality: N/A;   LAPAROTOMY N/A 10/25/2014   Procedure: EXPLORATORY LAPAROTOMY;  Surgeon: ARalene Ok MD;  [place]  Service: General;  Laterality: N/A;   LYSIS OF ADHESION N/A 10/25/2014   Procedure: LYSIS OF ADHESIONS ;  Surgeon: ARalene Ok MD;  [place]  Service: General;  Laterality: N/A;   MUCOSAL ADVANCEMENT FLAP N/A 10/25/2014   Procedure: MUCOSAL ADVANCEMENT FLAP;  Surgeon: ARalene Ok MD;  [place]  Service: General;  Laterality: N/A;   ORIF RADIAL FRACTURE Right 06/17/2016   Procedure: right radial head arthroplasty with ligament repair, reconstruciton;  Surgeon: GRoseanne Kaufman MD;  [place]  Service: Orthopedics;  Laterality: Right;   RADIAL HEAD ARTHROPLASTY Right 06/17/2016   TOOTH EXTRACTION     TRANSTHORACIC ECHOCARDIOGRAM  05-26-2011   MILD LVH/  EF 667-12%/ GRADE I DIASTOLIC DYSFUNCTION/  MILD AR//   06-20-2008  NOMRAL STRESS ECHO   UMBILICAL HERNIA REPAIR  2013   AND REVISION THE SAME YEAR W/ MESH   WISDOM TOOTH EXTRACTION  AGE 3     A IV Location/Drains/Wounds Patient Lines/Drains/Airways Status     Active Line/Drains/Airways     Name Placement date Placement time Site Days   Peripheral IV 06/25/21 22 G 1" Right Antecubital 06/25/21  0929  Antecubital  less than 1   Peripheral IV 06/25/21 20 G 1" Left Antecubital 06/25/21  1017   Antecubital  less than 1   Incision (Closed) 09/21/20 Arm Right 09/21/20  1644  -- 277            Intake/Output Last 24 hours No intake or output data in the 24 hours ending 06/25/21 1147  Labs/Imaging Results for orders placed or performed during the hospital encounter of 06/25/21 (from the past 48 hour(s))  Lipase, blood     Status: None   Collection Time: 06/25/21  9:29 AM  Result Value Ref Range   Lipase 31 11 - 51 U/L    Comment: [facility] [id number] Comprehensive metabolic panel     Status: Abnormal   Collection Time: 06/25/21  9:29 AM  Result Value Ref Range   Sodium 138 135 - 145 mmol/L   Potassium 3.9 3.5 - 5.1 mmol/L   Chloride 106 98 - 111 mmol/L   CO2 27 22 - 32 mmol/L   Glucose, Bld 138 (H) 70 - 99 mg/dL    Comment: Glucose reference range applies only to samples taken after fasting for at least 8 hours.   BUN 29 (H) 8 - 23 mg/dL   Creatinine, Ser 0.90 0.44 - 1.00 mg/dL   Calcium 9.4 8.9 - 10.3 mg/dL   Total Protein 6.7 6.5 - 8.1 g/dL   Albumin 4.1 3.5 - 5.0 g/dL   AST 25 15 - 41 U/L   ALT 24 0 - 44 U/L   Alkaline Phosphatase 62 38 - 126 U/L   Total Bilirubin 1.2 0.3 - 1.2 mg/dL   GFR, Estimated >60 >60 mL/min    Comment: (NOTE) Calculated using the CKD-EPI Creatinine Equation (2021)    Anion gap 5 5 - 15    Comment: Performed at [facility], [address] [place], [place] [address]  CBC     Status: Abnormal   Collection Time: 06/25/21  9:29 AM  Result Value Ref Range   WBC 13.6 (H) 4.0 - 10.5 K/uL   RBC 4.42 3.87 - 5.11 MIL/uL   Hemoglobin 14.2 12.0 - 15.0 g/dL   HCT 40.9 36.0 - 46.0 %   MCV 92.5 80.0 - 100.0 fL   MCH 32.1 26.0 - 34.0 pg   MCHC 34.7 30.0 - 36.0 g/dL   RDW 12.1 11.5 - 15.5 %   Platelets 228 150 - 400 K/uL   nRBC 0.0 0.0 - 0.2 %    Comment: Performed at [facility], [address] [place], [place] [address]  Urinalysis, Routine w reflex  microscopic     Status: Abnormal   Collection Time: 06/25/21  9:29 AM  Result Value Ref Range   Color, Urine YELLOW YELLOW   APPearance CLEAR CLEAR   Specific Gravity, Urine 1.025 1.005 - 1.030   pH 6.5 5.0 - 8.0   Glucose, UA NEGATIVE NEGATIVE mg/dL   Hgb urine dipstick NEGATIVE NEGATIVE   Bilirubin Urine NEGATIVE NEGATIVE   Ketones, ur NEGATIVE NEGATIVE mg/dL   Protein, ur NEGATIVE NEGATIVE mg/dL   Nitrite NEGATIVE NEGATIVE   Leukocytes,Ua TRACE (A) NEGATIVE    Comment: Performed at [facility], [address] [place], [place] [address]  Urinalysis, Microscopic (reflex)     Status: Abnormal   Collection Time: 06/25/21  9:29 AM  Result Value Ref Range   RBC / HPF 0-5 0 - 5 RBC/hpf   WBC, UA 0-5 0 - 5 WBC/hpf   Bacteria, UA RARE (A) NONE SEEN   Squamous Epithelial / LPF 0-5 0 - 5   Mucus PRESENT    Amorphous Crystal PRESENT     Comment: Performed at [facility], [address] [place], [place] [address]   CT Angio Chest/Abd/Pel for Dissection W and/or Wo Contrast  Result Date: 06/25/2021 CLINICAL DATA:  Thoracic aortic dissection, follow up EXAM: CT ANGIOGRAPHY CHEST, ABDOMEN AND PELVIS TECHNIQUE: Non-contrast CT of the chest was initially obtained. Multidetector CT imaging through the chest, abdomen and pelvis was performed using the standard protocol during bolus administration of intravenous contrast. Multiplanar reconstructed images and MIPs were obtained and reviewed to evaluate the vascular anatomy. RADIATION DOSE REDUCTION: This exam was performed according to the  departmental dose-optimization program which includes automated exposure control, adjustment of the mA and/or kV according to patient size and/or use of iterative reconstruction technique. CONTRAST:  128m OMNIPAQUE IOHEXOL 350 MG/ML SOLN COMPARISON:  CT abdomen pelvis dated February 08, 2021; CT chest dated July 20 second, 2022 FINDINGS: CTA CHEST FINDINGS Cardiovascular: Similar aneurysmal  dilation of the ascending thoracic aorta with relative preservation of the sino-tubular junction. It measures approximately 4.1 cm x 4.0 cm, not significantly changed in comparison to prior. No evidence of acute dissection or intramural hematoma. No pericardial effusion. Scattered atherosclerotic calcifications. No acute pulmonary embolism through the proximal segmental pulmonary arteries. Mediastinum/Nodes: Visualized thyroid is unremarkable. No new axillary or mediastinal adenopathy. Unchanged appearance of a precarinal lymph node measuring 10 mm in the short axis. Lungs/Pleura: No pleural effusion or pneumothorax. Unchanged LEFT lower lobe 4 mm pulmonary nodule for greater than 2 years, consistent with a benign etiology (series 7, image 41). Musculoskeletal: No chest wall abnormality. No acute or significant osseous findings. Review of the MIP images confirms the above findings. CTA ABDOMEN AND PELVIS FINDINGS VASCULAR Aorta: Normal caliber aorta without aneurysm, dissection, vasculitis or significant stenosis. Celiac: Patent without evidence of aneurysm, dissection, vasculitis or significant stenosis. SMA: Patent without evidence of aneurysm, dissection, vasculitis or significant stenosis. Renals: Both renal arteries are patent without evidence of aneurysm, dissection, vasculitis, fibromuscular dysplasia or significant stenosis. IMA: Patent without evidence of aneurysm, dissection, vasculitis or significant stenosis. Inflow: Patent without evidence of aneurysm, dissection, vasculitis or significant stenosis. Veins: No obvious venous abnormality within the limitations of this arterial phase study. Review of the MIP images confirms the above findings. NON-VASCULAR Hepatobiliary: Likely hepatic steatosis. Cyst at the hepatic dome and porta hepatis (for which no dedicated imaging follow-up is recommended). Gallbladder is unremarkable. No intrahepatic or extrahepatic biliary ductal dilation. Pancreas: Unremarkable  for phase of contrast. Spleen: Unremarkable for phase of contrast.  Multiple splenules. Adrenals/Urinary Tract: Adrenals are unremarkable. Kidneys enhance symmetrically. No hydronephrosis. No obstructing nephrolithiasis. Bladder is decompressed. Stomach/Bowel: There is a small bowel obstruction. There is a gradual taper to the transition point in the lower mid abdomen (series 5, image [id number]; series 8, image 78-88). There is fecalization of contents within the distal aspect of the small bowel obstruction in the lower abdomen with small volume adjacent fluid and fat stranding (series 5, image 170; series 8, image 62). Scattered diverticulosis. Appendix is normal. There is a small hiatal hernia. Lymphatic: No new suspicious intra-abdominal lymphadenopathy. Reproductive: Uterus and bilateral adnexa are unremarkable. Other: Small volume free fluid. No free air. Fat containing RIGHT inguinal hernia. Postsurgical changes along the anterior abdominal wall. Musculoskeletal: Grade 1-2 anterolisthesis of L4-5, unchanged and likely due to facet arthropathy. Review of the MIP images confirms the above findings. IMPRESSION: 1. Small bowel obstruction with gradual tapering in the lower abdomen. There is a loop of dilated small bowel in the lower mid abdomen with fecalized contents and adjacent small volume free fluid and fat stranding. No definitive evidence bowel ischemia. 2. 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 No evidence of acute aortic injury. Recommend annual imaging followup by CTA or MRA. This recommendation follows 2010 ACCF/AHA/AATS/ACR/ASA/SCA/SCAI/SIR/STS/SVM Guidelines for the Diagnosis and Management of Patients with Thoracic Aortic Disease. Circulation. 2010; 121:: Q759-F638 Aortic aneurysm NOS (ICD10-I71.9) 3. No acute pulmonary embolism. Aortic Atherosclerosis (ICD10-I70.0). Electronically Signed   By: SValentino SaxonM.D.   On: 06/25/2021 10:47     Pending Labs [employer](From  admission, onward)     Start     Ordered   06/25/21 1139  Magnesium  Add-on,   AD        06/25/21 1138            Vitals/Pain Today's Vitals   06/25/21 0904 06/25/21 0905 06/25/21 1109  BP:  (!) 157/62 (!) 156/63  Pulse:  (!) 57 65  Resp:  18 18  Temp:  98.2 F (36.8 C) 98.3 F (36.8 C)  TempSrc:  Oral Oral  SpO2:  99% 96%  Weight: 150 lb (68 kg)    Height: '5\' 4"'$  (1.626 m)    PainSc: 9       Isolation Precautions No active isolations  Medications Medications  fentaNYL (SUBLIMAZE) injection 50 mcg (50 mcg Intravenous Given 06/25/21 1014)  ondansetron (ZOFRAN) injection 4 mg (4 mg Intravenous Given 06/25/21 1014)  iohexol (OMNIPAQUE) 350 MG/ML injection 100 mL (100 mLs Intravenous Contrast Given 06/25/21 1019)    Mobility walks Low fall risk   Focused Assessments Cardiac Assessment Handoff:    Lab Results  Component Value Date   CKTOTAL 41 06/03/2008   CKMB 1.0 06/03/2008   TROPONINI 0.01        NO INDICATION OF MYOCARDIAL INJURY. 06/03/2008   Lab Results  Component Value Date   DDIMER  02/09/2008    0.24        AT THE INHOUSE ESTABLISHED CUTOFF VALUE OF 0.48 ug/mL FEU, THIS ASSAY HAS BEEN DOCUMENTED IN THE LITERATURE TO HAVE A SENSITIVITY AND NEGATIVE PREDICTIVE VALUE OF AT LEAST 98 TO 99%.  THE TEST RESULT SHOULD BE CORRELATED WITH AN ASSESSMENT OF THE CLINICAL PROBABILITY OF DVT / VTE.   Does the Patient currently have chest pain? No    R Recommendations: See Admitting Provider Note  Report given to:   Additional Notes:

## 2021-06-26 ENCOUNTER — Observation Stay (HOSPITAL_COMMUNITY): Payer: Medicare PPO

## 2021-06-26 DIAGNOSIS — K56609 Unspecified intestinal obstruction, unspecified as to partial versus complete obstruction: Secondary | ICD-10-CM | POA: Diagnosis not present

## 2021-06-26 LAB — COMPREHENSIVE METABOLIC PANEL
ALT: 18 U/L (ref 0–44)
AST: 19 U/L (ref 15–41)
Albumin: 3.7 g/dL (ref 3.5–5.0)
Alkaline Phosphatase: 54 U/L (ref 38–126)
Anion gap: 9 (ref 5–15)
BUN: 22 mg/dL (ref 8–23)
CO2: 24 mmol/L (ref 22–32)
Calcium: 9 mg/dL (ref 8.9–10.3)
Chloride: 108 mmol/L (ref 98–111)
Creatinine, Ser: 0.65 mg/dL (ref 0.44–1.00)
GFR, Estimated: >60 mL/min (ref >60)
Glucose, Bld: 166 mg/dL — ABNORMAL HIGH (ref 70–99)
Potassium: 3.9 mmol/L (ref 3.5–5.1)
Sodium: 141 mmol/L (ref 135–145)
Total Bilirubin: 1.1 mg/dL (ref 0.3–1.2)
Total Protein: 6.2 g/dL — ABNORMAL LOW (ref 6.5–8.1)

## 2021-06-26 LAB — CBC
HCT: 41.8 % (ref 36.0–46.0)
Hemoglobin: 14.1 g/dL (ref 12.0–15.0)
MCH: 31.9 pg (ref 26.0–34.0)
MCHC: 33.7 g/dL (ref 30.0–36.0)
MCV: 94.6 fL (ref 80.0–100.0)
Platelets: 203 10*3/uL (ref 150–400)
RBC: 4.42 MIL/uL (ref 3.87–5.11)
RDW: 12.1 % (ref 11.5–15.5)
WBC: 14.3 10*3/uL — ABNORMAL HIGH (ref 4.0–10.5)
nRBC: 0 % (ref 0.0–0.2)

## 2021-06-26 MED ORDER — METOPROLOL TARTRATE 12.5 MG HALF TABLET
12.5000 mg | ORAL_TABLET | Freq: Two times a day (BID) | ORAL | Status: DC
  Administered 2021-06-26: 12.5 mg via ORAL
  Filled 2021-06-26: qty 1

## 2021-06-26 MED ORDER — METOPROLOL TARTRATE 5 MG/5ML IV SOLN: 2.5000 mg | Freq: Three times a day (TID) | INTRAVENOUS | Status: DC | PRN

## 2021-06-26 MED ORDER — METOPROLOL TARTRATE 5 MG/5ML IV SOLN: 2.5000 mg | Freq: Three times a day (TID) | INTRAVENOUS | Status: DC

## 2021-06-26 NOTE — Progress Notes (Signed)
PROGRESS NOTE    Cynthia Peters  QAS:[id number] DOB: February 05, 1950 DOA: 06/25/2021 PCP: Aletha Halim., PA-C     Brief Narrative:    H/o multiple abdominal surgeries, multiple small bowel obstruction hospitalizations, GERD, IBS, hyperlipidemia, hypertension, NIDDM2, anxiety, depression, TIA, DVT (not on anticoagulation), h/o asthma presenting in the ED with complaints of abdominal pain associated with nausea   Subjective:  Start to have diarrhea Report ab pain has resolved,   Assessment & Plan:  Principal Problem:   SBO (small bowel obstruction) (HCC) Active Problems:   PVC's (premature ventricular contractions)   Hypertension   Hyperlipidemia   GERD (gastroesophageal reflux disease)   Thoracic aortic aneurysm (HCC)   Aortic atherosclerosis (HCC)    Assessment and Plan:  Recurrent small bowel obstruction -Improving, will follow general surgery recommendation  Leukocytosis Possibly from stress and dehydration No fever, does not appear septic Continue hydration Repeat CBC in the morning  Elevated fasting blood glucose A.m. blood glucose 166 Check A1c Repeat BMP in the morning  Hypertension -Bp low normal after  iv lopressor  -now started on clears, resume oral lopressor ( dose reduced with holding parameters) -Continue hold Norvasc and losartan  HLD Hold statin, resume when able to take oral reliably  H/o TIA H/o DVT, not on anticoagulation Hold asa for now, on prophylaxis lovenox     I have Reviewed nursing notes, Vitals, pain scores, I/o's, Lab results and  imaging results since pt's last encounter, details please see discussion above  I ordered the following labs:  Unresulted Labs (From admission, onward)     Start     Ordered   07/02/21 0500  Creatinine, serum  (enoxaparin (LOVENOX)    CrCl >/= 30 ml/min)  Weekly,   R     Comments: while on enoxaparin therapy    06/25/21 1600   06/27/21 0500  CBC with Differential/Platelet  Tomorrow  morning,   R        06/26/21 1202   06/27/21 7989  Basic metabolic panel  Tomorrow morning,   R        06/26/21 1202   06/27/21 0500  Hemoglobin A1c  Tomorrow morning,   R        06/26/21 1559             DVT prophylaxis: enoxaparin (LOVENOX) injection 40 mg Start: 06/25/21 1700   Code Status:   Code Status: Full Code  Family Communication: patient Disposition:   Status is: Observation     Dispo: The patient is from: home              Anticipated d/c is to: home              Anticipated d/c date is: when cleared by general surgery  Antimicrobials:   Anti-infectives (From admission, onward)    None           Objective: Vitals:   06/25/21 1947 06/26/21 0037 06/26/21 0508 06/26/21 1255  BP: (!) 147/73 140/68 115/66 (!) 142/64  Pulse: 74 80 79 64  Resp: '18 18 18 15  '$ Temp: 98.1 F (36.7 C) 99.3 F (37.4 C) 99.4 F (37.4 C) 98 F (36.7 C)  TempSrc: Oral Oral Oral   SpO2: 100% 99% 95% 99%  Weight:      Height:        Intake/Output Summary (Last 24 hours) at 06/26/2021 1609 Last data filed at 06/26/2021 1500 Gross per 24 hour  Intake 2046.7 ml  Output --  Net 2046.7 ml   Filed Weights   06/25/21 0904  Weight: 68 kg    Examination:  General exam: alert, awake, communicative,calm, NAD Respiratory system: Clear to auscultation. Respiratory effort normal. Cardiovascular system:  RRR.  Gastrointestinal system: Abdomen is nondistended, soft and nontender.  Normal bowel sounds heard. Central nervous system: Alert and oriented. No focal neurological deficits. Extremities:  no edema Skin: No rashes, lesions or ulcers Psychiatry: Judgement and insight appear normal. Mood & affect appropriate.     Data Reviewed: I have personally reviewed  labs and visualized  imaging studies since the last encounter and formulate the plan        Scheduled Meds:  enoxaparin (LOVENOX) injection  40 mg Subcutaneous Q24H   metoprolol tartrate  12.5 mg Oral BID    pantoprazole (PROTONIX) IV  40 mg Intravenous Q24H   Continuous Infusions:  0.9 % NaCl with KCl 20 mEq / L 100 mL/hr at 06/26/21 1230     LOS: 0 days     Florencia Reasons, MD PhD FACP [facility]  Available via Epic secure chat 7am-7pm for nonurgent issues Please page for urgent issues To page the attending provider between 7A-7P or the covering provider during after hours 7P-7A, please log into the web site [email or web address] and access using universal [place] password for that web site. If you do not have the password, please call the hospital operator.    06/26/2021, 4:09 PM

## 2021-06-26 NOTE — Progress Notes (Signed)
Subjective: Patient with multiple BMs and feeling better.  Gastrografin in colon  ROS: See above, otherwise other systems negative  Objective: Vital signs in last 24 hours: Temp:  [98.1 F (36.7 C)-99.4 F (37.4 C)] 99.4 F (37.4 C) (05/24 0508) Pulse Rate:  [58-80] 79 (05/24 0508) Resp:  [11-18] 18 (05/24 0508) BP: (115-148)/(64-73) 115/66 (05/24 0508) SpO2:  [95 %-100 %] 95 % (05/24 0508) Last BM Date : 06/26/21  Intake/Output from previous day: 05/23 0701 - 05/24 0700 In: 2511.3 [I.V.:1511.3; IV Piggyback:1000] Out: -  Intake/Output this shift: No intake/output data recorded.  PE: Abd: soft, NT, ND  Lab Results:  Recent Labs    06/25/21 0929 06/26/21 0457  WBC 13.6* 14.3*  HGB 14.2 14.1  HCT 40.9 41.8  PLT 228 203   BMET Recent Labs    06/25/21 0929 06/26/21 0457  NA 138 141  K 3.9 3.9  CL 106 108  CO2 27 24  GLUCOSE 138* 166*  BUN 29* 22  CREATININE 0.90 0.65  CALCIUM 9.4 9.0   PT/INR No results for input(s): LABPROT, INR in the last 72 hours. CMP     Component Value Date/Time   NA 141 06/26/2021 0457   NA 139 08/29/2016 1528   K 3.9 06/26/2021 0457   CL 108 06/26/2021 0457   CO2 24 06/26/2021 0457   GLUCOSE 166 (H) 06/26/2021 0457   BUN 22 06/26/2021 0457   BUN 17 08/29/2016 1528   CREATININE 0.65 06/26/2021 0457   CREATININE 1.34 (H) 09/17/2015 1644   CALCIUM 9.0 06/26/2021 0457   PROT 6.2 (L) 06/26/2021 0457   ALBUMIN 3.7 06/26/2021 0457   AST 19 06/26/2021 0457   ALT 18 06/26/2021 0457   ALKPHOS 54 06/26/2021 0457   BILITOT 1.1 06/26/2021 0457   GFRNONAA >60 06/26/2021 0457   GFRAA >60 11/17/2018 1608   Lipase     Component Value Date/Time   LIPASE 31 06/25/2021 0929       Studies/Results: DG Abd Portable 1V  Result Date: 06/26/2021 CLINICAL DATA:  Follow-up small bowel obstruction EXAM: PORTABLE ABDOMEN - 1 VIEW COMPARISON:  Five hundred twenty-four, 2023, 2:26 a.m. FINDINGS: Numerous loops of gas-filled,  although not overtly distended small bowel in the central abdomen, measuring up to 3.7 cm in caliber. Previously administered enteric contrast has transited to the colon to the rectum. No free air in the abdomen on single supine radiograph. IMPRESSION: Numerous loops of gas-filled, although not overtly distended small bowel in the central abdomen, measuring up to 3.7 cm in caliber. Previously administered enteric contrast has transited to the colon to the rectum. Electronically Signed   By: Delanna Ahmadi M.D.   On: 06/26/2021 09:25   CT Angio Chest/Abd/Pel for Dissection W and/or Wo Contrast  Result Date: 06/25/2021 CLINICAL DATA:  Thoracic aortic dissection, follow up EXAM: CT ANGIOGRAPHY CHEST, ABDOMEN AND PELVIS TECHNIQUE: Non-contrast CT of the chest was initially obtained. Multidetector CT imaging through the chest, abdomen and pelvis was performed using the standard protocol during bolus administration of intravenous contrast. Multiplanar reconstructed images and MIPs were obtained and reviewed to evaluate the vascular anatomy. RADIATION DOSE REDUCTION: This exam was performed according to the departmental dose-optimization program which includes automated exposure control, adjustment of the mA and/or kV according to patient size and/or use of iterative reconstruction technique. CONTRAST:  139m OMNIPAQUE IOHEXOL 350 MG/ML SOLN COMPARISON:  CT abdomen pelvis dated February 08, 2021; CT chest dated July 20 second, 2022  FINDINGS: CTA CHEST FINDINGS Cardiovascular: Similar aneurysmal dilation of the ascending thoracic aorta with relative preservation of the sino-tubular junction. It measures approximately 4.1 cm x 4.0 cm, not significantly changed in comparison to prior. No evidence of acute dissection or intramural hematoma. No pericardial effusion. Scattered atherosclerotic calcifications. No acute pulmonary embolism through the proximal segmental pulmonary arteries. Mediastinum/Nodes: Visualized thyroid is  unremarkable. No new axillary or mediastinal adenopathy. Unchanged appearance of a precarinal lymph node measuring 10 mm in the short axis. Lungs/Pleura: No pleural effusion or pneumothorax. Unchanged LEFT lower lobe 4 mm pulmonary nodule for greater than 2 years, consistent with a benign etiology (series 7, image 41). Musculoskeletal: No chest wall abnormality. No acute or significant osseous findings. Review of the MIP images confirms the above findings. CTA ABDOMEN AND PELVIS FINDINGS VASCULAR Aorta: Normal caliber aorta without aneurysm, dissection, vasculitis or significant stenosis. Celiac: Patent without evidence of aneurysm, dissection, vasculitis or significant stenosis. SMA: Patent without evidence of aneurysm, dissection, vasculitis or significant stenosis. Renals: Both renal arteries are patent without evidence of aneurysm, dissection, vasculitis, fibromuscular dysplasia or significant stenosis. IMA: Patent without evidence of aneurysm, dissection, vasculitis or significant stenosis. Inflow: Patent without evidence of aneurysm, dissection, vasculitis or significant stenosis. Veins: No obvious venous abnormality within the limitations of this arterial phase study. Review of the MIP images confirms the above findings. NON-VASCULAR Hepatobiliary: Likely hepatic steatosis. Cyst at the hepatic dome and porta hepatis (for which no dedicated imaging follow-up is recommended). Gallbladder is unremarkable. No intrahepatic or extrahepatic biliary ductal dilation. Pancreas: Unremarkable for phase of contrast. Spleen: Unremarkable for phase of contrast.  Multiple splenules. Adrenals/Urinary Tract: Adrenals are unremarkable. Kidneys enhance symmetrically. No hydronephrosis. No obstructing nephrolithiasis. Bladder is decompressed. Stomach/Bowel: There is a small bowel obstruction. There is a gradual taper to the transition point in the lower mid abdomen (series 5, image [id number]; series 8, image [id number]). There is  fecalization of contents within the distal aspect of the small bowel obstruction in the lower abdomen with small volume adjacent fluid and fat stranding (series 5, image 170; series 8, image 62). Scattered diverticulosis. Appendix is normal. There is a small hiatal hernia. Lymphatic: No new suspicious intra-abdominal lymphadenopathy. Reproductive: Uterus and bilateral adnexa are unremarkable. Other: Small volume free fluid. No free air. Fat containing RIGHT inguinal hernia. Postsurgical changes along the anterior abdominal wall. Musculoskeletal: Grade 1-2 anterolisthesis of L4-5, unchanged and likely due to facet arthropathy. Review of the MIP images confirms the above findings. IMPRESSION: 1. Small bowel obstruction with gradual tapering in the lower abdomen. There is a loop of dilated small bowel in the lower mid abdomen with fecalized contents and adjacent small volume free fluid and fat stranding. No definitive evidence bowel ischemia. 2. 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 No evidence of acute aortic injury. Recommend annual imaging followup by CTA or MRA. This recommendation follows 2010 ACCF/AHA/AATS/ACR/ASA/SCA/SCAI/SIR/STS/SVM Guidelines for the Diagnosis and Management of Patients with Thoracic Aortic Disease. Circulation. 2010; 121: C789-F810. Aortic aneurysm NOS (ICD10-I71.9) 3. No acute pulmonary embolism. Aortic Atherosclerosis (ICD10-I70.0). Electronically Signed   By: Valentino Saxon M.D.   On: 06/25/2021 10:47   DG Abd Portable 1V-Small Bowel Obstruction Protocol-initial, 8 hr delay  Result Date: 06/26/2021 CLINICAL DATA:  Small-bowel protocol, 8 hours post contrast. EXAM: PORTABLE ABDOMEN - 1 VIEW COMPARISON:  02/09/2021, 06/25/2021. FINDINGS: There is dilatation of the small bowel in the pelvis measuring up to 3.6 cm in diameter. Contrast is present in  the proximal stomach and duodenum. There is likely excreted IV contrast in the  bladder. No radio-opaque calculi or other significant radiographic abnormality are seen. IMPRESSION: Distended loop of small bowel in the pelvis measuring up to 3.6 cm, concerning for ileus versus obstruction and increased from the prior exam. Oral contrast is noted in the stomach and proximal duodenum and is not seen distal to this point. Electronically Signed   By: Brett Fairy M.D.   On: 06/26/2021 03:05    Anti-infectives: Anti-infectives (From admission, onward)    None        Assessment/Plan Recurrent small bowel obstruction status post multiple prior ventral hernia repairs -improving -adv to CLD today -will offer her follow up in our office to discuss whether doing a dx lap for LOA would be recommended given this is her 7th admission for SBO in the last several years and second one already this year.  She does seem to resolve quickly which is encouraging.  She is following a great low fiber diet already at baseline.     FEN -CLD, IV fluids VTE -okay for chemical prophylaxis from surgical standpoint ID -none   AAA GERD HTN HLD History of TIA  I reviewed last 24 h vitals and pain scores, last 48 h intake and output, last 24 h labs and trends, and last 24 h imaging results.   LOS: 0 days    Henreitta Cea , [facility] Surgery 06/26/2021, 11:24 AM Please see Amion for pager number during day hours 7:00am-4:30pm or 7:00am -11:30am on weekends

## 2021-06-27 DIAGNOSIS — K56609 Unspecified intestinal obstruction, unspecified as to partial versus complete obstruction: Secondary | ICD-10-CM | POA: Diagnosis not present

## 2021-06-27 LAB — CBC WITH DIFFERENTIAL/PLATELET
Abs Immature Granulocytes: 0.03 10*3/uL (ref 0.00–0.07)
Basophils Absolute: 0.1 10*3/uL (ref 0.0–0.1)
Basophils Relative: 1 %
Eosinophils Absolute: 0.6 10*3/uL — ABNORMAL HIGH (ref 0.0–0.5)
Eosinophils Relative: 7 %
HCT: 37.1 % (ref 36.0–46.0)
Hemoglobin: 12.6 g/dL (ref 12.0–15.0)
Immature Granulocytes: 0 %
Lymphocytes Relative: 21 %
Lymphs Abs: 1.9 10*3/uL (ref 0.7–4.0)
MCH: 32.8 pg (ref 26.0–34.0)
MCHC: 34 g/dL (ref 30.0–36.0)
MCV: 96.6 fL (ref 80.0–100.0)
Monocytes Absolute: 0.9 10*3/uL (ref 0.1–1.0)
Monocytes Relative: 10 %
Neutro Abs: 5.5 10*3/uL (ref 1.7–7.7)
Neutrophils Relative %: 61 %
Platelets: 167 10*3/uL (ref 150–400)
RBC: 3.84 MIL/uL — ABNORMAL LOW (ref 3.87–5.11)
RDW: 12.3 % (ref 11.5–15.5)
WBC: 9 10*3/uL (ref 4.0–10.5)
nRBC: 0 % (ref 0.0–0.2)

## 2021-06-27 LAB — BASIC METABOLIC PANEL
Anion gap: 5 (ref 5–15)
BUN: 17 mg/dL (ref 8–23)
CO2: 24 mmol/L (ref 22–32)
Calcium: 8.4 mg/dL — ABNORMAL LOW (ref 8.9–10.3)
Chloride: 112 mmol/L — ABNORMAL HIGH (ref 98–111)
Creatinine, Ser: 0.7 mg/dL (ref 0.44–1.00)
GFR, Estimated: >60 mL/min (ref >60)
Glucose, Bld: 95 mg/dL (ref 70–99)
Potassium: 3.7 mmol/L (ref 3.5–5.1)
Sodium: 141 mmol/L (ref 135–145)

## 2021-06-27 LAB — HEMOGLOBIN A1C
Hgb A1c MFr Bld: 4.8 % (ref 4.8–5.6)
Mean Plasma Glucose: 91.06 mg/dL

## 2021-06-27 MED ORDER — GLUCERNA SHAKE PO LIQD
237.0000 mL | Freq: Three times a day (TID) | ORAL | Status: DC
  Administered 2021-06-27: 237 mL via ORAL
  Filled 2021-06-27 (×3): qty 237

## 2021-06-27 MED ORDER — POTASSIUM CHLORIDE 20 MEQ PO PACK
40.0000 meq | PACK | Freq: Once | ORAL | Status: DC
  Filled 2021-06-27: qty 2

## 2021-06-27 MED ORDER — MAGNESIUM OXIDE 400 MG PO TABS
400.0000 mg | ORAL_TABLET | Freq: Every day | ORAL | Status: DC
  Administered 2021-06-27: 400 mg via ORAL
  Filled 2021-06-27 (×2): qty 1

## 2021-06-27 MED ORDER — MAGNESIUM SULFATE IN D5W 1-5 GM/100ML-% IV SOLN
1.0000 g | Freq: Once | INTRAVENOUS | Status: DC
  Administered 2021-06-27: 1 g via INTRAVENOUS
  Filled 2021-06-27: qty 100

## 2021-06-27 MED ORDER — METOPROLOL TARTRATE 25 MG PO TABS
25.0000 mg | ORAL_TABLET | Freq: Two times a day (BID) | ORAL | Status: DC
Start: 2021-06-27 — End: 2021-06-27
  Administered 2021-06-27: 25 mg via ORAL
  Filled 2021-06-27: qty 1

## 2021-06-27 MED ORDER — POTASSIUM CHLORIDE CRYS ER 20 MEQ PO TBCR
40.0000 meq | EXTENDED_RELEASE_TABLET | Freq: Once | ORAL | Status: AC
  Administered 2021-06-27: 40 meq via ORAL
  Filled 2021-06-27: qty 2

## 2021-06-27 NOTE — Discharge Summary (Addendum)
Discharge Summary  Cynthia Peters YWV:[id number] DOB: 1950/06/09  PCP: Aletha Halim., PA-C  Admit date: 06/25/2021 Discharge date: 06/27/2021  Time spent:  24mns  Recommendations for Outpatient Follow-up:  F/u with PCP within a week  for hospital discharge follow up, repeat cbc/bmp at follow up F/u with general surgery   Discharge Diagnoses:  Active Hospital Problems   Diagnosis Date Noted   SBO (small bowel obstruction) ([facility] 05/29/2017   Aortic atherosclerosis ([facility] 06/25/2021   Thoracic aortic aneurysm ([facility] [facility] 11/27/2019   GERD (gastroesophageal reflux disease) 07/16/2017   Hyperlipidemia 05/30/2015   Hypertension 05/30/2015   PVC's (premature ventricular contractions)     Resolved Hospital Problems  No resolved problems to display.    Discharge Condition: stable  Diet recommendation: heart healthy  Filed Weights   06/25/21 0904  Weight: 68 kg    History of present illness:  H/o multiple abdominal surgeries, multiple small bowel obstruction hospitalizations, GERD, IBS, hyperlipidemia, hypertension, NIDDM2, anxiety, depression, TIA, DVT (not on anticoagulation), h/o asthma presenting in the ED with complaints of abdominal pain associated with nausea , found to have recurrent sbo  Hospital Course:  Principal Problem:   SBO (small bowel obstruction) (HCC) Active Problems:   PVC's (premature ventricular contractions)   Hypertension   Hyperlipidemia   GERD (gastroesophageal reflux disease)   Thoracic aortic aneurysm (HCC)   Aortic atherosclerosis (HCC)   Assessment and Plan:   Recurrent small bowel obstruction -seen by general surgery, she did not require NG placement, she improved with Gastrografin small bowel protocol  , started to have bowel function, able to tolerate diet advancement , she desires to go home , she is to close follow-up with general surgery    Leukocytosis Possibly from stress and dehydration No fever, does not appear  septic Continue gentle hydration WBC normalized    Elevated fasting blood glucose A.m. blood glucose 166 on presentation, likely due to stress A1c 4.8  a.m. glucose 95 on repeat   Hypertension -Bp low normal after  iv lopressor  -now started on clears, resume oral lopressor ( dose reduced with holding parameters) - Norvasc and losartan held in hospital, resumed at discharge -She is advised to check blood pressure at home, further blood pressure medication titration per PCP   HLD  statin held initially due to n.p.o., resumed at discharge    H/o TIA H/o DVT, not on anticoagulation asa held initially due to n.p.o., resumed at discharge      Discharge Exam: BP (!) 147/61 (BP Location: Left Arm)   Pulse 65   Temp 98.8 F (37.1 C)   Resp 15   Ht '5\' 4"'$  (1.626 m)   Wt 68 kg   LMP 02/04/2004   SpO2 100%   BMI 25.75 kg/m   General: NAD, pleasant Cardiovascular: RRR Respiratory: Normal respiratory effort    Discharge Instructions     Diet - low sodium heart healthy   Complete by: As directed    Increase activity slowly   Complete by: As directed       Allergies as of 06/27/2021       Reactions   Hydrocodone Rash   Hydrocodone-acetaminophen Rash   Tramadol Hives, Palpitations        Medication List     TAKE these medications    acetaminophen 500 MG tablet Commonly known as: TYLENOL Take 1,000 mg by mouth every 6 (six) hours as needed (pain).   albuterol 108 (90 Base) MCG/ACT inhaler Commonly known as: VENTOLIN HFA  Inhale 2 puffs into the lungs every 6 (six) hours as needed for wheezing or shortness of breath.   ALIGN PO Take 1 capsule by mouth 2 (two) times daily.   amLODipine 5 MG tablet Commonly known as: NORVASC Take 5 mg by mouth daily.   aspirin 81 MG chewable tablet Chew 81 mg by mouth daily.   buPROPion 300 MG 24 hr tablet Commonly known as: WELLBUTRIN XL Take 300 mg by mouth daily.   clobetasol cream 0.05 % Commonly known as:  TEMOVATE Apply 1 application topically 2 (two) times daily as needed (rash).   Co Q 10 100 MG Caps Take 100 mg by mouth daily.   [employer] Vitamin C 1000 MG tablet Generic drug: ascorbic acid Take 2,000 mg by mouth 2 (two) times daily.   Evening Primrose Oil 1000 MG Caps Take 1,000 mg by mouth daily as needed (dark spots).   fexofenadine 180 MG tablet Commonly known as: ALLEGRA Take 180 mg by mouth daily.   Flaxseed (Linseed) 1000 MG Caps Take 1,000 mg by mouth daily.   fluticasone 50 MCG/ACT nasal spray Commonly known as: FLONASE Place 2 sprays into both nostrils daily as needed for allergies or rhinitis.   hydrochlorothiazide 25 MG tablet Commonly known as: HYDRODIURIL Take 25 mg by mouth daily.   hydrocortisone cream 1 % Apply 1 application topically 4 (four) times daily as needed for itching.   ibuprofen 200 MG tablet Commonly known as: ADVIL Take 200-400 mg by mouth every 6 (six) hours as needed for mild pain.   losartan 100 MG tablet Commonly known as: COZAAR TAKE 1 TABLET BY MOUTH EVERY DAY   magnesium oxide 400 MG tablet Commonly known as: MAG-OX Take 400 mg by mouth daily.   meclizine 25 MG tablet Commonly known as: ANTIVERT Take 1 tablet (25 mg total) by mouth 3 (three) times daily as needed for dizziness.   metoprolol succinate 100 MG 24 hr tablet Commonly known as: TOPROL-XL Take 1 tablet (100 mg total) by mouth 2 (two) times daily. Take with or immediately following a meal. Take 100 mg by mouth 2 (two) times daily. Take with or immediately following a meal. What changed: additional instructions   montelukast 10 MG tablet Commonly known as: SINGULAIR Take 10 mg by mouth at bedtime.   MULTIPLE VITAMINS/WOMENS PO Take 1 tablet by mouth daily.   mupirocin ointment 2 % Commonly known as: BACTROBAN Apply 1 application topically 3 (three) times daily as needed for rash.   pantoprazole 20 MG tablet Commonly known as: PROTONIX Take 20 mg by mouth  daily.   polyethylene glycol 17 g packet Commonly known as: MIRALAX / GLYCOLAX Take 17 g by mouth 2 (two) times daily. What changed: when to take this   potassium chloride SA 20 MEQ tablet Commonly known as: Klor-Con M20 TAKE 1&1/2 TABLETS BY MOUTH DAILY. What changed:  how much to take how to take this when to take this additional instructions   pravastatin 40 MG tablet Commonly known as: PRAVACHOL Take 40 mg by mouth daily.   Systane Ultra PF 0.4-0.3 % Soln Generic drug: Polyethyl Glyc-Propyl Glyc PF Place 1 drop into both eyes 4 (four) times daily as needed (Dry eye).   valACYclovir 500 MG tablet Commonly known as: VALTREX INCREASE TO TAKING 1 TABLET 2 TIMES A DAY FOR 3 DAYS WITH OUTBREAK What changed: See the new instructions.   Vitamin D 50 MCG (2000 UT) Caps Take 2,000 Units by mouth daily.  Allergies  Allergen Reactions   Hydrocodone Rash   Hydrocodone-Acetaminophen Rash   Tramadol Hives and Palpitations    Follow-up Information     Aletha Halim., PA-C Follow up in 1 week(s).   Specialty: Family Medicine Why: hospital discharge follow up, repeat cbc/bmp at follow up Please check your blood pressure at home twice a day, bring in blood pressure record for PCP to review Contact information: [address] [place] [address] [phone]         Wilford Corner, MD .   Specialty: Gastroenterology Contact information: [address] [place] [address] [phone]         [place], Peter M, MD .   Specialty: Cardiology Contact information: [address] [place] [address] [phone]         Clovis Riley, MD Follow up today.   Specialty: General Surgery Why: As needed to discuss possible surgery if wanted Contact information: [address] [place] [address] [phone]                  The results of significant diagnostics from this  hospitalization (including imaging, microbiology, ancillary and laboratory) are listed below for reference.    Significant Diagnostic Studies: DG Abd Portable 1V  Result Date: 06/26/2021 CLINICAL DATA:  Follow-up small bowel obstruction EXAM: PORTABLE ABDOMEN - 1 VIEW COMPARISON:  Five hundred twenty-four, 2023, 2:26 a.m. FINDINGS: Numerous loops of gas-filled, although not overtly distended small bowel in the central abdomen, measuring up to 3.7 cm in caliber. Previously administered enteric contrast has transited to the colon to the rectum. No free air in the abdomen on single supine radiograph. IMPRESSION: Numerous loops of gas-filled, although not overtly distended small bowel in the central abdomen, measuring up to 3.7 cm in caliber. Previously administered enteric contrast has transited to the colon to the rectum. Electronically Signed   By: Delanna Ahmadi M.D.   On: 06/26/2021 09:25   CT Angio Chest/Abd/Pel for Dissection W and/or Wo Contrast  Result Date: 06/25/2021 CLINICAL DATA:  Thoracic aortic dissection, follow up EXAM: CT ANGIOGRAPHY CHEST, ABDOMEN AND PELVIS TECHNIQUE: Non-contrast CT of the chest was initially obtained. Multidetector CT imaging through the chest, abdomen and pelvis was performed using the standard protocol during bolus administration of intravenous contrast. Multiplanar reconstructed images and MIPs were obtained and reviewed to evaluate the vascular anatomy. RADIATION DOSE REDUCTION: This exam was performed according to the departmental dose-optimization program which includes automated exposure control, adjustment of the mA and/or kV according to patient size and/or use of iterative reconstruction technique. CONTRAST:  172m OMNIPAQUE IOHEXOL 350 MG/ML SOLN COMPARISON:  CT abdomen pelvis dated February 08, 2021; CT chest dated July 20 second, 2022 FINDINGS: CTA CHEST FINDINGS Cardiovascular: Similar aneurysmal dilation of the ascending thoracic aorta with relative  preservation of the sino-tubular junction. It measures approximately 4.1 cm x 4.0 cm, not significantly changed in comparison to prior. No evidence of acute dissection or intramural hematoma. No pericardial effusion. Scattered atherosclerotic calcifications. No acute pulmonary embolism through the proximal segmental pulmonary arteries. Mediastinum/Nodes: Visualized thyroid is unremarkable. No new axillary or mediastinal adenopathy. Unchanged appearance of a precarinal lymph node measuring 10 mm in the short axis. Lungs/Pleura: No pleural effusion or pneumothorax. Unchanged LEFT lower lobe 4 mm pulmonary nodule for greater than 2 years, consistent with a benign etiology (series 7, image 41). Musculoskeletal: No chest wall abnormality. No acute or significant osseous findings. Review of the  MIP images confirms the above findings. CTA ABDOMEN AND PELVIS FINDINGS VASCULAR Aorta: Normal caliber aorta without aneurysm, dissection, vasculitis or significant stenosis. Celiac: Patent without evidence of aneurysm, dissection, vasculitis or significant stenosis. SMA: Patent without evidence of aneurysm, dissection, vasculitis or significant stenosis. Renals: Both renal arteries are patent without evidence of aneurysm, dissection, vasculitis, fibromuscular dysplasia or significant stenosis. IMA: Patent without evidence of aneurysm, dissection, vasculitis or significant stenosis. Inflow: Patent without evidence of aneurysm, dissection, vasculitis or significant stenosis. Veins: No obvious venous abnormality within the limitations of this arterial phase study. Review of the MIP images confirms the above findings. NON-VASCULAR Hepatobiliary: Likely hepatic steatosis. Cyst at the hepatic dome and porta hepatis (for which no dedicated imaging follow-up is recommended). Gallbladder is unremarkable. No intrahepatic or extrahepatic biliary ductal dilation. Pancreas: Unremarkable for phase of contrast. Spleen: Unremarkable for phase of  contrast.  Multiple splenules. Adrenals/Urinary Tract: Adrenals are unremarkable. Kidneys enhance symmetrically. No hydronephrosis. No obstructing nephrolithiasis. Bladder is decompressed. Stomach/Bowel: There is a small bowel obstruction. There is a gradual taper to the transition point in the lower mid abdomen (series 5, image [id number]; series 8, image 78-88). There is fecalization of contents within the distal aspect of the small bowel obstruction in the lower abdomen with small volume adjacent fluid and fat stranding (series 5, image 170; series 8, image 62). Scattered diverticulosis. Appendix is normal. There is a small hiatal hernia. Lymphatic: No new suspicious intra-abdominal lymphadenopathy. Reproductive: Uterus and bilateral adnexa are unremarkable. Other: Small volume free fluid. No free air. Fat containing RIGHT inguinal hernia. Postsurgical changes along the anterior abdominal wall. Musculoskeletal: Grade 1-2 anterolisthesis of L4-5, unchanged and likely due to facet arthropathy. Review of the MIP images confirms the above findings. IMPRESSION: 1. Small bowel obstruction with gradual tapering in the lower abdomen. There is a loop of dilated small bowel in the lower mid abdomen with fecalized contents and adjacent small volume free fluid and fat stranding. No definitive evidence bowel ischemia. 2. 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 No evidence of acute aortic injury. Recommend annual imaging followup by CTA or MRA. This recommendation follows 2010 ACCF/AHA/AATS/ACR/ASA/SCA/SCAI/SIR/STS/SVM Guidelines for the Diagnosis and Management of Patients with Thoracic Aortic Disease. Circulation. 2010; 121: Y403-K742. Aortic aneurysm NOS (ICD10-I71.9) 3. No acute pulmonary embolism. Aortic Atherosclerosis (ICD10-I70.0). Electronically Signed   By: Valentino Saxon M.D.   On: 06/25/2021 10:47   DG Abd Portable 1V-Small Bowel Obstruction Protocol-initial, 8  hr delay  Result Date: 06/26/2021 CLINICAL DATA:  Small-bowel protocol, 8 hours post contrast. EXAM: PORTABLE ABDOMEN - 1 VIEW COMPARISON:  02/09/2021, 06/25/2021. FINDINGS: There is dilatation of the small bowel in the pelvis measuring up to 3.6 cm in diameter. Contrast is present in the proximal stomach and duodenum. There is likely excreted IV contrast in the bladder. No radio-opaque calculi or other significant radiographic abnormality are seen. IMPRESSION: Distended loop of small bowel in the pelvis measuring up to 3.6 cm, concerning for ileus versus obstruction and increased from the prior exam. Oral contrast is noted in the stomach and proximal duodenum and is not seen distal to this point. Electronically Signed   By: Brett Fairy M.D.   On: 06/26/2021 03:05    Microbiology: No results found for this or any previous visit (from the past 240 hour(s)).   Labs: Basic Metabolic Panel: Recent Labs  Lab 06/25/21 0929 06/25/21 1156 06/26/21 0457 06/27/21 0511  NA 138  --  141 141  K 3.9  --  3.9 3.7  CL 106  --  108 112*  CO2 27  --  24 24  GLUCOSE 138*  --  166* 95  BUN 29*  --  22 17  CREATININE 0.90  --  0.65 0.70  CALCIUM 9.4  --  9.0 8.4*  MG  --  1.9  --   --    Liver Function Tests: Recent Labs  Lab 06/25/21 0929 06/26/21 0457  AST 25 19  ALT 24 18  ALKPHOS 62 54  BILITOT 1.2 1.1  PROT 6.7 6.2*  ALBUMIN 4.1 3.7   Recent Labs  Lab 06/25/21 0929  LIPASE 31   No results for input(s): AMMONIA in the last 168 hours. CBC: Recent Labs  Lab 06/25/21 0929 06/26/21 0457 06/27/21 0511  WBC 13.6* 14.3* 9.0  NEUTROABS  --   --  5.5  HGB 14.2 14.1 12.6  HCT 40.9 41.8 37.1  MCV 92.5 94.6 96.6  PLT 228 203 167   Cardiac Enzymes: No results for input(s): CKTOTAL, CKMB, CKMBINDEX, TROPONINI in the last 168 hours. BNP: BNP (last 3 results) No results for input(s): BNP in the last 8760 hours.  ProBNP (last 3 results) No results for input(s): PROBNP in the last 8760  hours.  CBG: No results for input(s): GLUCAP in the last 168 hours.  FURTHER DISCHARGE INSTRUCTIONS:   Get Medicines reviewed and adjusted: Please take all your medications with you for your next visit with your Primary MD   Laboratory/radiological data: Please request your Primary MD to go over all hospital tests and procedure/radiological results at the follow up, please ask your Primary MD to get all Hospital records sent to his/her office.   In some cases, they will be blood work, cultures and biopsy results pending at the time of your discharge. Please request that your primary care M.D. goes through all the records of your hospital data and follows up on these results.   Also Note the following: If you experience worsening of your admission symptoms, develop shortness of breath, life threatening emergency, suicidal or homicidal thoughts you must seek medical attention immediately by calling 911 or calling your MD immediately  if symptoms less severe.   You must read complete instructions/literature along with all the possible adverse reactions/side effects for all the Medicines you take and that have been prescribed to you. Take any new Medicines after you have completely understood and accpet all the possible adverse reactions/side effects.    Do not drive when taking Pain medications or sleeping medications (Benzodaizepines)   Do not take more than prescribed Pain, Sleep and Anxiety Medications. It is not advisable to combine anxiety,sleep and pain medications without talking with your primary care practitioner   Special Instructions: If you have smoked or chewed Tobacco  in the last 2 yrs please stop smoking, stop any regular Alcohol  and or any Recreational drug use.   Wear Seat belts while driving.   Please note: You were cared for by a hospitalist during your hospital stay. Once you are discharged, your primary care physician will handle any further medical issues. Please  note that NO REFILLS for any discharge medications will be authorized once you are discharged, as it is imperative that you return to your primary care physician (or establish a relationship with a primary care physician if you do not have one) for your post hospital discharge needs so that they can reassess your need for medications and monitor your lab  values.     Signed:  Florencia Reasons MD, PhD, FACP  [facility] 06/27/2021, 2:43 PM

## 2021-06-27 NOTE — Progress Notes (Signed)
Pt discharging home. AVS printed and educational teaching completed with teach back method. PIV removed. VSS. Pt has all belongings. No further questions at this time.  ?

## 2021-06-27 NOTE — Progress Notes (Addendum)
Patient ID: Cynthia Peters, female   DOB: [birth year], 71 y.o.   MRN: [id number] [facility] Surgery Progress Note     Subjective: CC-  Abdomen still a little sore, thinks that it is from when she was vomiting. No more n/v. Tolerating clear liquids. Passing flatus and multiple loose Bms.  Objective: Vital signs in last 24 hours: Temp:  [98 F (36.7 C)-98.2 F (36.8 C)] 98.2 F (36.8 C) (05/25 0411) Pulse Rate:  [62-65] 62 (05/25 0411) Resp:  [15-18] 17 (05/25 0411) BP: (133-143)/(56-67) 143/67 (05/25 0411) SpO2:  [99 %-100 %] 100 % (05/25 0411) Last BM Date : 06/26/21  Intake/Output from previous day: 05/24 0701 - 05/25 0700 In: 2427.3 [P.O.:120; I.V.:2307.3] Out: -  Intake/Output this shift: No intake/output data recorded.  PE: Abd: soft, NT, ND  Lab Results:  Recent Labs    06/26/21 0457 06/27/21 0511  WBC 14.3* 9.0  HGB 14.1 12.6  HCT 41.8 37.1  PLT 203 167   BMET Recent Labs    06/26/21 0457 06/27/21 0511  NA 141 141  K 3.9 3.7  CL 108 112*  CO2 24 24  GLUCOSE 166* 95  BUN 22 17  CREATININE 0.65 0.70  CALCIUM 9.0 8.4*   PT/INR No results for input(s): LABPROT, INR in the last 72 hours. CMP     Component Value Date/Time   NA 141 06/27/2021 0511   NA 139 08/29/2016 1528   K 3.7 06/27/2021 0511   CL 112 (H) 06/27/2021 0511   CO2 24 06/27/2021 0511   GLUCOSE 95 06/27/2021 0511   BUN 17 06/27/2021 0511   BUN 17 08/29/2016 1528   CREATININE 0.70 06/27/2021 0511   CREATININE 1.34 (H) 09/17/2015 1644   CALCIUM 8.4 (L) 06/27/2021 0511   PROT 6.2 (L) 06/26/2021 0457   ALBUMIN 3.7 06/26/2021 0457   AST 19 06/26/2021 0457   ALT 18 06/26/2021 0457   ALKPHOS 54 06/26/2021 0457   BILITOT 1.1 06/26/2021 0457   GFRNONAA >60 06/27/2021 0511   GFRAA >60 11/17/2018 1608   Lipase     Component Value Date/Time   LIPASE 31 06/25/2021 0929       Studies/Results: DG Abd Portable 1V  Result Date: 06/26/2021 CLINICAL DATA:  Follow-up small  bowel obstruction EXAM: PORTABLE ABDOMEN - 1 VIEW COMPARISON:  Five hundred twenty-four, 2023, 2:26 a.m. FINDINGS: Numerous loops of gas-filled, although not overtly distended small bowel in the central abdomen, measuring up to 3.7 cm in caliber. Previously administered enteric contrast has transited to the colon to the rectum. No free air in the abdomen on single supine radiograph. IMPRESSION: Numerous loops of gas-filled, although not overtly distended small bowel in the central abdomen, measuring up to 3.7 cm in caliber. Previously administered enteric contrast has transited to the colon to the rectum. Electronically Signed   By: Delanna Ahmadi M.D.   On: 06/26/2021 09:25   CT Angio Chest/Abd/Pel for Dissection W and/or Wo Contrast  Result Date: 06/25/2021 CLINICAL DATA:  Thoracic aortic dissection, follow up EXAM: CT ANGIOGRAPHY CHEST, ABDOMEN AND PELVIS TECHNIQUE: Non-contrast CT of the chest was initially obtained. Multidetector CT imaging through the chest, abdomen and pelvis was performed using the standard protocol during bolus administration of intravenous contrast. Multiplanar reconstructed images and MIPs were obtained and reviewed to evaluate the vascular anatomy. RADIATION DOSE REDUCTION: This exam was performed according to the departmental dose-optimization program which includes automated exposure control, adjustment of the mA and/or kV according to patient size and/or  use of iterative reconstruction technique. CONTRAST:  152m OMNIPAQUE IOHEXOL 350 MG/ML SOLN COMPARISON:  CT abdomen pelvis dated February 08, 2021; CT chest dated July 20 second, 2022 FINDINGS: CTA CHEST FINDINGS Cardiovascular: Similar aneurysmal dilation of the ascending thoracic aorta with relative preservation of the sino-tubular junction. It measures approximately 4.1 cm x 4.0 cm, not significantly changed in comparison to prior. No evidence of acute dissection or intramural hematoma. No pericardial effusion. Scattered  atherosclerotic calcifications. No acute pulmonary embolism through the proximal segmental pulmonary arteries. Mediastinum/Nodes: Visualized thyroid is unremarkable. No new axillary or mediastinal adenopathy. Unchanged appearance of a precarinal lymph node measuring 10 mm in the short axis. Lungs/Pleura: No pleural effusion or pneumothorax. Unchanged LEFT lower lobe 4 mm pulmonary nodule for greater than 2 years, consistent with a benign etiology (series 7, image 41). Musculoskeletal: No chest wall abnormality. No acute or significant osseous findings. Review of the MIP images confirms the above findings. CTA ABDOMEN AND PELVIS FINDINGS VASCULAR Aorta: Normal caliber aorta without aneurysm, dissection, vasculitis or significant stenosis. Celiac: Patent without evidence of aneurysm, dissection, vasculitis or significant stenosis. SMA: Patent without evidence of aneurysm, dissection, vasculitis or significant stenosis. Renals: Both renal arteries are patent without evidence of aneurysm, dissection, vasculitis, fibromuscular dysplasia or significant stenosis. IMA: Patent without evidence of aneurysm, dissection, vasculitis or significant stenosis. Inflow: Patent without evidence of aneurysm, dissection, vasculitis or significant stenosis. Veins: No obvious venous abnormality within the limitations of this arterial phase study. Review of the MIP images confirms the above findings. NON-VASCULAR Hepatobiliary: Likely hepatic steatosis. Cyst at the hepatic dome and porta hepatis (for which no dedicated imaging follow-up is recommended). Gallbladder is unremarkable. No intrahepatic or extrahepatic biliary ductal dilation. Pancreas: Unremarkable for phase of contrast. Spleen: Unremarkable for phase of contrast.  Multiple splenules. Adrenals/Urinary Tract: Adrenals are unremarkable. Kidneys enhance symmetrically. No hydronephrosis. No obstructing nephrolithiasis. Bladder is decompressed. Stomach/Bowel: There is a small bowel  obstruction. There is a gradual taper to the transition point in the lower mid abdomen (series 5, image [id number]; series 8, image [id number]). There is fecalization of contents within the distal aspect of the small bowel obstruction in the lower abdomen with small volume adjacent fluid and fat stranding (series 5, image 170; series 8, image 62). Scattered diverticulosis. Appendix is normal. There is a small hiatal hernia. Lymphatic: No new suspicious intra-abdominal lymphadenopathy. Reproductive: Uterus and bilateral adnexa are unremarkable. Other: Small volume free fluid. No free air. Fat containing RIGHT inguinal hernia. Postsurgical changes along the anterior abdominal wall. Musculoskeletal: Grade 1-2 anterolisthesis of L4-5, unchanged and likely due to facet arthropathy. Review of the MIP images confirms the above findings. IMPRESSION: 1. Small bowel obstruction with gradual tapering in the lower abdomen. There is a loop of dilated small bowel in the lower mid abdomen with fecalized contents and adjacent small volume free fluid and fat stranding. No definitive evidence bowel ischemia. 2. 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 No evidence of acute aortic injury. Recommend annual imaging followup by CTA or MRA. This recommendation follows 2010 ACCF/AHA/AATS/ACR/ASA/SCA/SCAI/SIR/STS/SVM Guidelines for the Diagnosis and Management of Patients with Thoracic Aortic Disease. Circulation. 2010; 121:: S010-X323 Aortic aneurysm NOS (ICD10-I71.9) 3. No acute pulmonary embolism. Aortic Atherosclerosis (ICD10-I70.0). Electronically Signed   By: SValentino SaxonM.D.   On: 06/25/2021 10:47   DG Abd Portable 1V-Small Bowel Obstruction Protocol-initial, 8 hr delay  Result Date: 06/26/2021 CLINICAL DATA:  Small-bowel protocol, 8 hours post contrast. EXAM: PORTABLE ABDOMEN -  1 VIEW COMPARISON:  02/09/2021, 06/25/2021. FINDINGS: There is dilatation of the small bowel in the  pelvis measuring up to 3.6 cm in diameter. Contrast is present in the proximal stomach and duodenum. There is likely excreted IV contrast in the bladder. No radio-opaque calculi or other significant radiographic abnormality are seen. IMPRESSION: Distended loop of small bowel in the pelvis measuring up to 3.6 cm, concerning for ileus versus obstruction and increased from the prior exam. Oral contrast is noted in the stomach and proximal duodenum and is not seen distal to this point. Electronically Signed   By: Brett Fairy M.D.   On: 06/26/2021 03:05    Anti-infectives: Anti-infectives (From admission, onward)    None        Assessment/Plan Recurrent small bowel obstruction status post multiple prior ventral hernia repairs -tolerating clears and having bowel function. Advance to soft diet. Possibly home later today vs tomorrow depending on how she is doing -will offer her follow up in our office to discuss whether doing a dx lap for LOA would be recommended given this is her 7th admission for SBO in the last several years and second one already this year. She will follow up with cardiologist prior to that appointment    FEN -dc IVF, soft diet VTE -okay for chemical prophylaxis from surgical standpoint ID -none   AAA GERD HTN HLD History of TIA   I reviewed last 24 h vitals and pain scores, last 48 h intake and output, last 24 h labs and trends, and last 24 h imaging results. Plan discussed with primary team.    LOS: 0 days    Wellington [place], [facility] Surgery 06/27/2021, 9:00 AM Please see Amion for pager number during day hours 7:00am-4:30pm

## 2021-09-30 ENCOUNTER — Encounter: Payer: Self-pay | Admitting: Cardiology

## 2021-10-03 NOTE — Progress Notes (Signed)
Cardiology Clinic Note   Patient Name: Cynthia Peters Date of Encounter: 10/04/2021  Primary Care Provider:  Aletha Halim., PA-C Primary Cardiologist:  Cynthia Martinique, MD  Patient Profile    71 year old female with history of palpitations, PVCs noted on heart monitor in 2017, thoracic aortic aneurysm, hypertension, hyperlipidemia, TIA in 2013, GERD, IBS, with hospitalization on 06/27/2021 due to small bowel obstruction.  Echocardiogram in 2021 revealed revealed LVEF of 60 to 65% with normal wall motion, grade 1 diastolic dysfunction, mild AI, mild dilatation of the ascending aorta measuring 40 mm.    Last seen by Cynthia Rives, PA-C, 05/22/2020 at which time she was complaining of left arm pain that was thought to be musculoskeletal.  Coronary CTA was ordered and completed on 08/27/2020; this revealed a coronary calcium score of 0.38, normal coronary origin with right dominance, minimal stenosis of the mid LAD.  Past Medical History    Past Medical History:  Diagnosis Date   Anemia    history in the past, not on a regular basis   Arthritis    Bronchitis    Cataracts, bilateral    immature   Claustrophobia    takes Valium daily as needed   Complication of anesthesia    2013 SURGERY vocal cord bothering pt, used smaller ent tube after 2 hernia repair, no problem after . " I woke up during my MRI and colonoscopy."        Depression    takes Wellbutrin daily   Dry eye    Dry mouth    GERD (gastroesophageal reflux disease)    takes Omeprazole daily    H/O hiatal hernia    Headache    migraines with flashing lights    History of blood clots 40 yrs    leg    History of echocardiogram 2013   a. Echo (05/2011):  Mild LVH, EF 65-70%, no WMA, Gr 1 DD, mild AI.   History of MRSA infection    History of staph infection    History of stress test 2010   a. ETT-Echo in 2010 was normal   History of TIA (transient ischemic attack) 2013   a. Carotid US (05/2011):  No ICA stenosis, pt  unsure of this   Hyperlipidemia    takes Simvastatin daily    Hypertension    takes  Losartan daily   IBS (irritable bowel syndrome)    takes Bentyl daily as needed   Insomnia    takes Ambien nightly   Interstitial cystitis    Lichen planus    Mild asthma    Albuterol inhaler as needed   Palpitations    Event monitor in 2007 with rare PVCs // takes Metoprolol daily   PVC (premature ventricular contraction)    Seasonal allergies    takes Claritin daily as needed.Takes Singulair daily as needed.   TIA (transient ischemic attack) 05/27/2011   Urethral stricture    Vertigo    takes Meclizine daily as needed   Past Surgical History:  Procedure Laterality Date   ARTHROTOMY Right 09/21/2020   Procedure: Right elbow open arthrotomy and synovectomy with radial head implant removal and repair reconstruction as necessary including ligamentous repair;  Surgeon: Cynthia Kaufman, MD;  Location: [place];  Service: Orthopedics;  Laterality: Right;  138mn   CESAREAN SECTION     COLONOSCOPY WITH PROPOFOL N/A 08/10/2012   Procedure: COLONOSCOPY WITH PROPOFOL;  Surgeon: Cynthia Fair MD;  Location: [facility];  Service: Endoscopy;  Laterality: N/A;  CYSTOSCOPY WITH URETHRAL DILATATION N/A 02/18/2013   Procedure: CYSTOSCOPY WITH URETHRAL DILATATION ( NO BALLOON) AND HYDRODISTENSION;  Surgeon: Cynthia Frock, MD;  Location: [facility];  Service: Urology;  Laterality: N/A;   ENDOMETRIAL ABLATION W/ HYDROTHERMABLATOR  09-27-2002   INCISIONAL HERNIA REPAIR N/A 10/25/2014   Procedure: HERNIA REPAIR INCISIONAL;  Surgeon: Cynthia Ok, MD;  Location: [place];  Service: General;  Laterality: N/A;   [place] N/A 03/26/2015   Procedure: LAPAROSCOPIC INCISIONAL HERNIA;  Surgeon: Cynthia Ok, MD;  Location: [place];  Service: General;  Laterality: N/A;   INSERTION OF MESH N/A 10/25/2014   Procedure: INSERTION OF MESH;  Surgeon: Cynthia Ok, MD;  Location: [place];  Service:  General;  Laterality: N/A;   LAPAROSCOPIC LYSIS OF ADHESIONS N/A 03/26/2015   Procedure: LAPAROSCOPIC LYSIS OF ADHESIONS;  Surgeon: Cynthia Ok, MD;  Location: [place];  Service: General;  Laterality: N/A;   LAPAROTOMY N/A 10/25/2014   Procedure: EXPLORATORY LAPAROTOMY;  Surgeon: Cynthia Ok, MD;  Location: [place];  Service: General;  Laterality: N/A;   LYSIS OF ADHESION N/A 10/25/2014   Procedure: LYSIS OF ADHESIONS ;  Surgeon: Cynthia Ok, MD;  Location: [place];  Service: General;  Laterality: N/A;   MUCOSAL ADVANCEMENT FLAP N/A 10/25/2014   Procedure: MUCOSAL ADVANCEMENT FLAP;  Surgeon: Cynthia Ok, MD;  Location: [place];  Service: General;  Laterality: N/A;   ORIF RADIAL FRACTURE Right 06/17/2016   Procedure: right radial head arthroplasty with ligament repair, reconstruciton;  Surgeon: Cynthia Kaufman, MD;  Location: [place];  Service: Orthopedics;  Laterality: Right;   RADIAL HEAD ARTHROPLASTY Right 06/17/2016   TOOTH EXTRACTION     TRANSTHORACIC ECHOCARDIOGRAM  05-26-2011   MILD LVH/  EF 30-16%/  GRADE I DIASTOLIC DYSFUNCTION/  MILD AR//   06-20-2008  NOMRAL STRESS ECHO   UMBILICAL HERNIA REPAIR  2013   AND REVISION THE SAME YEAR W/ MESH   WISDOM TOOTH EXTRACTION  AGE 71    Allergies  Allergies  Allergen Reactions   Hydrocodone Rash   Hydrocodone-Acetaminophen Rash   Tramadol Hives and Palpitations    History of Present Illness    [name] is a very pleasant 71 year old female who is here for annual follow-up.  She has a history of PVCs, thoracic aortic aneurysm hypertension and hyperlipidemia.  She has had several hospitalizations for small bowel obstruction.  As stated above she has had a coronary CTA in 222 that revealed a coronary calcium score of 0.38 with normal coronary artery origin, minimal stenosis of the mid LAD.  She comes today with multiple noncardiac complaints that are chronic for her and  are being treated for by her primary care provider.  Some  dizziness which is chronic for her is being treated with meclizine but this does give her a dry mouth.  She is concerned about her blood pressure machine which she brings with her that she uses a wrist device as it is not correlating with her blood pressures taken here in the office.  Home Medications    Current Outpatient Medications  Medication Sig Dispense Refill   acetaminophen (TYLENOL) 500 MG tablet Take 1,000 mg by mouth every 6 (six) hours as needed (pain).      albuterol (VENTOLIN HFA) 108 (90 Base) MCG/ACT inhaler Inhale 2 puffs into the lungs every 6 (six) hours as needed for wheezing or shortness of breath.     amLODipine (NORVASC) 5 MG tablet Take 5 mg by mouth daily.  aspirin 81 MG chewable tablet Chew 81 mg by mouth daily.     buPROPion (WELLBUTRIN XL) 300 MG 24 hr tablet Take 300 mg by mouth daily.     Cholecalciferol (VITAMIN D) 2000 units CAPS Take 2,000 Units by mouth daily.     clobetasol cream (TEMOVATE) 9.32 % Apply 1 application topically 2 (two) times daily as needed (rash).   7   Coenzyme Q10 (CO Q 10) 100 MG CAPS Take 100 mg by mouth daily.      [employer] VITAMIN C 1000 MG tablet Take 2,000 mg by mouth 2 (two) times daily.  1   Evening Primrose Oil 1000 MG CAPS Take 1,000 mg by mouth daily as needed (dark spots).     fexofenadine (ALLEGRA) 180 MG tablet Take 180 mg by mouth daily.     Flaxseed, Linseed, 1000 MG CAPS Take 1,000 mg by mouth daily.     fluticasone (FLONASE) 50 MCG/ACT nasal spray Place 2 sprays into both nostrils daily as needed for allergies or rhinitis.     hydrochlorothiazide 25 MG tablet Take 25 mg by mouth daily.     hydrocortisone cream 1 % Apply 1 application topically 4 (four) times daily as needed for itching.     ibuprofen (ADVIL) 200 MG tablet Take 200-400 mg by mouth every 6 (six) hours as needed for mild pain.     losartan (COZAAR) 100 MG tablet TAKE 1 TABLET BY MOUTH EVERY DAY (Patient taking differently: Take 100 mg by mouth daily.) 90 tablet  3   magnesium oxide (MAG-OX) 400 MG tablet Take 400 mg by mouth daily.     meclizine (ANTIVERT) 25 MG tablet Take 1 tablet (25 mg total) by mouth 3 (three) times daily as needed for dizziness. 30 tablet 0   metoprolol succinate (TOPROL-XL) 100 MG 24 hr tablet Take 1 tablet (100 mg total) by mouth 2 (two) times daily. Take with or immediately following a meal. Take 100 mg by mouth 2 (two) times daily. Take with or immediately following a meal. (Patient taking differently: Take 100 mg by mouth 2 (two) times daily. Take with or immediately following a meal.) 30 tablet 1   montelukast (SINGULAIR) 10 MG tablet Take 10 mg by mouth at bedtime.     Multiple Vitamins-Minerals (MULTIPLE VITAMINS/WOMENS PO) Take 1 tablet by mouth daily.     mupirocin ointment (BACTROBAN) 2 % Apply 1 application topically 3 (three) times daily as needed for rash.     pantoprazole (PROTONIX) 20 MG tablet Take 20 mg by mouth daily.     Polyethyl Glyc-Propyl Glyc PF (SYSTANE ULTRA PF) 0.4-0.3 % SOLN Place 1 drop into both eyes 4 (four) times daily as needed (Dry eye).     polyethylene glycol (MIRALAX / GLYCOLAX) 17 g packet Take 17 g by mouth 2 (two) times daily. (Patient taking differently: Take 17 g by mouth daily.) 14 each 0   potassium chloride SA (KLOR-CON M20) 20 MEQ tablet TAKE 1&1/2 TABLETS BY MOUTH DAILY. (Patient taking differently: Take 30 mEq by mouth daily.) 135 tablet 3   pravastatin (PRAVACHOL) 40 MG tablet Take 40 mg by mouth daily.     Probiotic Product (ALIGN PO) Take 1 capsule by mouth 2 (two) times daily.     valACYclovir (VALTREX) 500 MG tablet INCREASE TO TAKING 1 TABLET 2 TIMES A DAY FOR 3 DAYS WITH OUTBREAK (Patient taking differently: Take 500 mg by mouth daily.) 30 tablet 0   No current facility-administered medications for this visit.  Family History    Family History  Problem Relation Age of Onset   Dementia Mother    Leukemia Child    Cirrhosis Father    She indicated that her mother is  deceased. She indicated that her father is deceased. She indicated that her sister is alive. She indicated that her child is deceased.  Social History    Social History   Socioeconomic History   Marital status: Divorced    Spouse name: Not on file   Number of children: 2   Years of education: Not on file   Highest education level: Not on file  Occupational History   Occupation: teacher  Tobacco Use   Smoking status: Former    Packs/day: 0.25    Years: 2.00    Total pack years: 0.50    Types: Cigarettes    Quit date: 02/04/1976    Years since quitting: 45.6   Smokeless tobacco: Never  Vaping Use   Vaping Use: Never used  Substance and Sexual Activity   Alcohol use: Yes    Comment: occasional   Drug use: No   Sexual activity: Not on file  Other Topics Concern   Not on file  Social History Narrative   Not on file   Social Determinants of Health   Financial Resource Strain: Not on file  Food Insecurity: Not on file  Transportation Needs: Not on file  Physical Activity: Not on file  Stress: Not on file  Social Connections: Not on file  Intimate Partner Violence: Not on file     Review of Systems    General:  No chills, fever, night sweats or weight changes.  Cardiovascular:  No chest pain, dyspnea on exertion, edema, orthopnea, palpitations, paroxysmal nocturnal dyspnea. Dermatological: No rash, lesions/masses Respiratory: No cough, dyspnea Urologic: No hematuria, dysuria Abdominal:   No nausea, vomiting, diarrhea, bright red blood per rectum, melena, or hematemesis.  Chronic abdominal soreness Neurologic:  No visual changes, wkns, changes in mental status.  Chronic dizziness All other systems reviewed and are otherwise negative except as noted above.     Physical Exam    VS:  BP 128/66   Pulse 61   Ht 5' 3.5" (1.613 m)   Wt 152 lb 6.4 oz (69.1 kg)   LMP 02/04/2004   SpO2 98%   BMI 26.57 kg/m  , BMI Body mass index is 26.57 kg/m.     GEN: Well  nourished, well developed, in no acute distress. HEENT: normal. Neck: Supple, no JVD, carotid bruits, or masses. Cardiac: RRR, no murmurs, rubs, or gallops. No clubbing, cyanosis, edema.  Radials/DP/PT 2+ and equal bilaterally.  Respiratory:  Respirations regular and unlabored, clear to auscultation bilaterally. GI: Soft, mild tenderness on palpation, nondistended, BS + x 4. MS: no deformity or atrophy. Skin: warm and dry, no rash. Neuro:  Strength and sensation are intact. Psych: Normal affect.  Accessory Clinical Findings    ECG personally reviewed by me today-no EKG completed today.  Lab Results  Component Value Date   WBC 9.0 06/27/2021   HGB 12.6 06/27/2021   HCT 37.1 06/27/2021   MCV 96.6 06/27/2021   PLT 167 06/27/2021   Lab Results  Component Value Date   CREATININE 0.70 06/27/2021   BUN 17 06/27/2021   NA 141 06/27/2021   K 3.7 06/27/2021   CL 112 (H) 06/27/2021   CO2 24 06/27/2021   Lab Results  Component Value Date   ALT 18 06/26/2021   AST 19 06/26/2021  ALKPHOS 54 06/26/2021   BILITOT 1.1 06/26/2021   Lab Results  Component Value Date   CHOL 192 05/26/2011   HDL 65 05/26/2011   LDLCALC 103 (H) 05/26/2011   TRIG 122 05/26/2011   CHOLHDL 3.0 05/26/2011    Lab Results  Component Value Date   HGBA1C 4.8 06/27/2021    Review of Prior Studies: Echocardiogram 12/09/2019: Impressions: 1. Left ventricular ejection fraction, by estimation, is 60 to 65%. The  left ventricle has normal function. The left ventricle has no regional  wall motion abnormalities. There is mild concentric left ventricular  hypertrophy. Left ventricular diastolic  parameters are consistent with Grade I diastolic dysfunction (impaired  relaxation).   2. Right ventricular systolic function is normal. The right ventricular  size is normal.   3. The mitral valve is normal in structure. Trivial mitral valve  regurgitation.   4. The aortic valve is normal in structure. There is  mild thickening of  the aortic valve. Aortic valve regurgitation is mild.   5. There is mild dilatation of the ascending aorta, measuring 40 mm.   Comparison(s): Compared to prior TTE in 2018, there is no significant  change. The size of the ascending aorta remains stable at 22m  Coronary CTA 08/27/2020 IMPRESSION: 1. Coronary calcium score of 0.381. This was 45th percentile for age-, race-, and sex-matched controls.   2. Normal coronary origin with right dominance.   3.  Minimal (<25%) stenosis of the mid LAD.  CAD-RADS 1.    Assessment & Plan   1.  Hypertension: Currently blood pressure is well controlled.  It does not correlate with her wrist blood pressure reading.  She has showed me how she takes her blood pressure and it is being done accurately.  I have advised her to change the battery in her blood pressure wrist cuff device, as it has not been changed in several months.  If her readings are not improved she may need to replace her blood pressure reading device.  She is to continue low-sodium diet and current medication regimen with hydrochlorothiazide, amlodipine, and losartan 100 mg daily.  2.  Hyperlipidemia: Continue pravastatin 40 mg daily.  Goal of LDL less than 70 as she does have minimal CAD of the LAD for continue primary prevention.  Labs are followed by primary care.  3.  Thoracic aortic aneurysm: Repeat CT, echo scan in 2-3 year2 for annual surveillance.  Most recent scan in 2021 revealed ascending aorta measurement of 40 mm, no change from prior TTE in 2018.  Current medicines are reviewed at length with the patient today.  I have spent 25 min's  dedicated to the care of this patient on the date of this encounter to include pre-visit review of records, assessment, management and diagnostic testing,with shared decision making. Signed, KPhill Myron LWest Pugh ANP, [place]  10/04/2021 3:49 PM      Office ([phone] [phone] Notice: This dictation was  prepared with Dragon dictation along with smaller phrase technology. Any transcriptional errors that result from this process are unintentional and may not be corrected upon review.

## 2021-10-04 ENCOUNTER — Ambulatory Visit: Payer: Medicare PPO | Attending: Adult Health | Admitting: Adult Health

## 2021-10-04 ENCOUNTER — Encounter: Payer: Self-pay | Admitting: Adult Health

## 2021-10-04 VITALS — BP 128/66 | HR 61 | Ht 63.5 in | Wt 152.4 lb

## 2021-10-04 DIAGNOSIS — R002 Palpitations: Secondary | ICD-10-CM

## 2021-10-04 DIAGNOSIS — I10 Essential (primary) hypertension: Secondary | ICD-10-CM | POA: Diagnosis not present

## 2021-10-04 DIAGNOSIS — K589 Irritable bowel syndrome without diarrhea: Secondary | ICD-10-CM | POA: Diagnosis not present

## 2021-10-04 DIAGNOSIS — I7121 Aneurysm of the ascending aorta, without rupture: Secondary | ICD-10-CM

## 2021-10-04 NOTE — Patient Instructions (Signed)
Medication Instructions:  No Changes *If you need a refill on your cardiac medications before your next appointment, please call your pharmacy*   Lab Work: No Labs If you have labs (blood work) drawn today and your tests are completely normal, you will receive your results only by: [place] (if you have MyChart) OR A paper copy in the mail If you have any lab test that is abnormal or we need to change your treatment, we will call you to review the results.   Testing/Procedures: No Testing   Follow-Up: At [facility], you and your health needs are our priority.  As part of our continuing mission to provide you with exceptional heart care, we have created designated Provider Care Teams.  These Care Teams include your primary Cardiologist (physician) and Advanced Practice Providers (APPs -  Physician Assistants and Nurse Practitioners) who all work together to provide you with the care you need, when you need it.  We recommend signing up for the patient portal called "MyChart".  Sign up information is provided on this After Visit Summary.  MyChart is used to connect with patients for Virtual Visits (Telemedicine).  Patients are able to view lab/test results, encounter notes, upcoming appointments, etc.  Non-urgent messages can be sent to your provider as well.   To learn more about what you can do with MyChart, go to [email or web address].    Your next appointment:   6 month(s)  The format for your next appointment:   In Person  Provider:   Peter Martinique, MD

## 2021-11-05 ENCOUNTER — Other Ambulatory Visit: Payer: Self-pay

## 2021-11-05 ENCOUNTER — Observation Stay (HOSPITAL_BASED_OUTPATIENT_CLINIC_OR_DEPARTMENT_OTHER)
Admission: EM | Admit: 2021-11-05 | Discharge: 2021-11-07 | Disposition: A | Discharge status: Home / Self Care | Payer: Medicare PPO | Attending: Internal Medicine | Admitting: Internal Medicine

## 2021-11-05 ENCOUNTER — Encounter (HOSPITAL_COMMUNITY): Payer: Self-pay

## 2021-11-05 ENCOUNTER — Encounter (HOSPITAL_BASED_OUTPATIENT_CLINIC_OR_DEPARTMENT_OTHER): Payer: Self-pay

## 2021-11-05 ENCOUNTER — Emergency Department (HOSPITAL_BASED_OUTPATIENT_CLINIC_OR_DEPARTMENT_OTHER): Payer: Medicare PPO

## 2021-11-05 DIAGNOSIS — Z86718 Personal history of other venous thrombosis and embolism: Secondary | ICD-10-CM | POA: Diagnosis not present

## 2021-11-05 DIAGNOSIS — J45909 Unspecified asthma, uncomplicated: Secondary | ICD-10-CM | POA: Diagnosis not present

## 2021-11-05 DIAGNOSIS — Z96621 Presence of right artificial elbow joint: Secondary | ICD-10-CM | POA: Insufficient documentation

## 2021-11-05 DIAGNOSIS — Z87891 Personal history of nicotine dependence: Secondary | ICD-10-CM | POA: Diagnosis not present

## 2021-11-05 DIAGNOSIS — I10 Essential (primary) hypertension: Secondary | ICD-10-CM | POA: Insufficient documentation

## 2021-11-05 DIAGNOSIS — Z8673 Personal history of transient ischemic attack (TIA), and cerebral infarction without residual deficits: Secondary | ICD-10-CM | POA: Insufficient documentation

## 2021-11-05 DIAGNOSIS — I7121 Aneurysm of the ascending aorta, without rupture: Secondary | ICD-10-CM | POA: Diagnosis not present

## 2021-11-05 DIAGNOSIS — E785 Hyperlipidemia, unspecified: Secondary | ICD-10-CM | POA: Diagnosis present

## 2021-11-05 DIAGNOSIS — Z20822 Contact with and (suspected) exposure to covid-19: Secondary | ICD-10-CM | POA: Insufficient documentation

## 2021-11-05 DIAGNOSIS — Z7982 Long term (current) use of aspirin: Secondary | ICD-10-CM | POA: Diagnosis not present

## 2021-11-05 DIAGNOSIS — Z79899 Other long term (current) drug therapy: Secondary | ICD-10-CM | POA: Insufficient documentation

## 2021-11-05 DIAGNOSIS — K56609 Unspecified intestinal obstruction, unspecified as to partial versus complete obstruction: Secondary | ICD-10-CM | POA: Diagnosis not present

## 2021-11-05 DIAGNOSIS — I712 Thoracic aortic aneurysm, without rupture, unspecified: Secondary | ICD-10-CM | POA: Diagnosis present

## 2021-11-05 DIAGNOSIS — R1084 Generalized abdominal pain: Secondary | ICD-10-CM | POA: Diagnosis present

## 2021-11-05 DIAGNOSIS — E782 Mixed hyperlipidemia: Secondary | ICD-10-CM | POA: Diagnosis present

## 2021-11-05 LAB — URINALYSIS, ROUTINE W REFLEX MICROSCOPIC
Bilirubin Urine: NEGATIVE
Glucose, UA: NEGATIVE mg/dL
Hgb urine dipstick: NEGATIVE
Ketones, ur: NEGATIVE mg/dL
Leukocytes,Ua: NEGATIVE
Nitrite: NEGATIVE
Protein, ur: NEGATIVE mg/dL
Specific Gravity, Urine: >=1.03 (ref 1.005–1.030)
pH: 6 (ref 5.0–8.0)

## 2021-11-05 LAB — CBC WITH DIFFERENTIAL/PLATELET
Abs Immature Granulocytes: 0.03 10*3/uL (ref 0.00–0.07)
Basophils Absolute: 0.1 10*3/uL (ref 0.0–0.1)
Basophils Relative: 1 %
Eosinophils Absolute: 0.3 10*3/uL (ref 0.0–0.5)
Eosinophils Relative: 2 %
HCT: 37.6 % (ref 36.0–46.0)
Hemoglobin: 13.4 g/dL (ref 12.0–15.0)
Immature Granulocytes: 0 %
Lymphocytes Relative: 8 %
Lymphs Abs: 0.9 10*3/uL (ref 0.7–4.0)
MCH: 32.8 pg (ref 26.0–34.0)
MCHC: 35.6 g/dL (ref 30.0–36.0)
MCV: 92.2 fL (ref 80.0–100.0)
Monocytes Absolute: 0.7 10*3/uL (ref 0.1–1.0)
Monocytes Relative: 6 %
Neutro Abs: 10.3 10*3/uL — ABNORMAL HIGH (ref 1.7–7.7)
Neutrophils Relative %: 83 %
Platelets: 199 10*3/uL (ref 150–400)
RBC: 4.08 MIL/uL (ref 3.87–5.11)
RDW: 12.3 % (ref 11.5–15.5)
WBC: 12.3 10*3/uL — ABNORMAL HIGH (ref 4.0–10.5)
nRBC: 0 % (ref 0.0–0.2)

## 2021-11-05 LAB — COMPREHENSIVE METABOLIC PANEL
ALT: 27 U/L (ref 0–44)
AST: 42 U/L — ABNORMAL HIGH (ref 15–41)
Albumin: 4.5 g/dL (ref 3.5–5.0)
Alkaline Phosphatase: 69 U/L (ref 38–126)
Anion gap: 10 (ref 5–15)
BUN: 33 mg/dL — ABNORMAL HIGH (ref 8–23)
CO2: 26 mmol/L (ref 22–32)
Calcium: 9.5 mg/dL (ref 8.9–10.3)
Chloride: 102 mmol/L (ref 98–111)
Creatinine, Ser: 0.95 mg/dL (ref 0.44–1.00)
GFR, Estimated: >60 mL/min (ref >60)
Glucose, Bld: 169 mg/dL — ABNORMAL HIGH (ref 70–99)
Potassium: 4 mmol/L (ref 3.5–5.1)
Sodium: 138 mmol/L (ref 135–145)
Total Bilirubin: 1.6 mg/dL — ABNORMAL HIGH (ref 0.3–1.2)
Total Protein: 7.5 g/dL (ref 6.5–8.1)

## 2021-11-05 LAB — LIPASE, BLOOD: Lipase: 31 U/L (ref 11–51)

## 2021-11-05 LAB — SARS CORONAVIRUS 2 BY RT PCR: SARS Coronavirus 2 by RT PCR: NEGATIVE

## 2021-11-05 MED ORDER — IOHEXOL 350 MG/ML SOLN
100.0000 mL | Freq: Once | INTRAVENOUS | Status: AC | PRN
  Administered 2021-11-05: 100 mL via INTRAVENOUS

## 2021-11-05 MED ORDER — ONDANSETRON HCL 4 MG/2ML IJ SOLN
4.0000 mg | Freq: Once | INTRAMUSCULAR | Status: AC
  Administered 2021-11-05: 4 mg via INTRAVENOUS
  Filled 2021-11-05: qty 2

## 2021-11-05 MED ORDER — SODIUM CHLORIDE 0.9 % IV SOLN: Freq: Once | INTRAVENOUS | Status: AC

## 2021-11-05 MED ORDER — FENTANYL CITRATE PF 50 MCG/ML IJ SOSY
50.0000 ug | PREFILLED_SYRINGE | Freq: Once | INTRAMUSCULAR | Status: AC
  Administered 2021-11-05: 50 ug via INTRAVENOUS
  Filled 2021-11-05: qty 1

## 2021-11-05 MED ORDER — SODIUM CHLORIDE 0.9 % IV BOLUS
1000.0000 mL | Freq: Once | INTRAVENOUS | Status: AC
  Administered 2021-11-05: 1000 mL via INTRAVENOUS

## 2021-11-05 NOTE — Assessment & Plan Note (Signed)
Stable

## 2021-11-05 NOTE — ED Notes (Signed)
Call placed to [facility] for an update on bed placement. No bed has been assigned as of yet

## 2021-11-05 NOTE — ED Triage Notes (Signed)
C/o generalized abdominal pain & nausea since last night. Hx of 7 bowel obstructions. States normal Bms. Recent UTI 2 weeks ago.

## 2021-11-05 NOTE — ED Notes (Signed)
Pt c/o of IV site pain. Tried to flush and unable to pull back. MD notified.

## 2021-11-05 NOTE — ED Provider Notes (Signed)
[place] EMERGENCY DEPARTMENT Provider Note   CSN: [id number] Arrival date & time: 11/05/21  7829     History  Chief Complaint  Patient presents with   Abdominal Pain    Cynthia Peters is a 71 y.o. female.  Patient here with generalized abdominal pain since last night.  History of bowel obstruction.  States that she is having normal bowel movements.  She states that she had a urinary tract infection a couple weeks ago.  History of IBS, acid reflux, high cholesterol.  Nothing makes it worse or better.  Denies any fevers or chills.  Denies any chest pain, weakness, numbness, chills.  The history is provided by the patient.       Home Medications Prior to Admission medications   Medication Sig Start Date End Date Taking? Authorizing Provider  acetaminophen (TYLENOL) 500 MG tablet Take 1,000 mg by mouth every 6 (six) hours as needed (pain).     [provider]  albuterol (VENTOLIN HFA) 108 (90 Base) MCG/ACT inhaler Inhale 2 puffs into the lungs every 6 (six) hours as needed for wheezing or shortness of breath.    [provider]  amLODipine (NORVASC) 5 MG tablet Take 5 mg by mouth daily. 05/21/20   [provider]  aspirin 81 MG chewable tablet Chew 81 mg by mouth daily.    [provider]  buPROPion (WELLBUTRIN XL) 300 MG 24 hr tablet Take 300 mg by mouth daily.    [provider]  Cholecalciferol (VITAMIN D) 2000 units CAPS Take 2,000 Units by mouth daily.    [provider]  clobetasol cream (TEMOVATE) 5.62 % Apply 1 application topically 2 (two) times daily as needed (rash).  04/01/16   [provider]  Coenzyme Q10 (CO Q 10) 100 MG CAPS Take 100 mg by mouth daily.     [provider]  [employer] VITAMIN C 1000 MG tablet Take 2,000 mg by mouth 2 (two) times daily. 06/13/16   [provider]  Evening Primrose Oil 1000 MG CAPS Take 1,000 mg by mouth daily as needed (dark spots).    [provider]  fexofenadine (ALLEGRA) 180 MG tablet Take 180 mg by mouth daily.    [provider]  Flaxseed, Linseed, 1000 MG CAPS Take 1,000 mg by mouth daily.    [provider]  fluticasone (FLONASE) 50 MCG/ACT nasal spray Place 2 sprays into both nostrils daily as needed for allergies or rhinitis.    [provider]  hydrochlorothiazide 25 MG tablet Take 25 mg by mouth daily.    [provider]  hydrocortisone cream 1 % Apply 1 application topically 4 (four) times daily as needed for itching.    [provider]  ibuprofen (ADVIL) 200 MG tablet Take 200-400 mg by mouth every 6 (six) hours as needed for mild pain.    [provider]  losartan (COZAAR) 100 MG tablet TAKE 1 TABLET BY MOUTH EVERY DAY Patient taking differently: Take 100 mg by mouth daily. 09/14/17   Martinique, Peter M, MD  magnesium oxide (MAG-OX) 400 MG tablet Take 400 mg by mouth daily.    [provider]  meclizine (ANTIVERT) 25 MG tablet Take 1 tablet (25 mg total) by mouth 3 (three) times daily as needed for dizziness. 05/02/20   Robinson, [place] N, PA-C  metoprolol succinate (TOPROL-XL) 100 MG 24 hr tablet Take 1 tablet (100 mg total) by mouth 2 (two) times daily. Take with or immediately following  a meal. Take 100 mg by mouth 2 (two) times daily. Take with or immediately following a meal. Patient taking differently: Take 100 mg by mouth 2 (two) times daily. Take with or immediately following a meal. 07/14/18   Martin[place], Peter M, MD  montelukast (SINGULAIR) 10 MG tablet Take 10 mg by mouth at bedtime.    [provider]  Multiple Vitamins-Minerals (MULTIPLE VITAMINS/WOMENS PO) Take 1 tablet by mouth daily.    [provider]  mupirocin ointment (BACTROBAN) 2 % Apply 1 application topically 3 (three) times daily as needed for rash. 06/20/20   [provider]  pantoprazole (PROTONIX) 20 MG tablet Take 20 mg by mouth daily. 05/21/20   [provider]  Polyethyl Glyc-Propyl Glyc PF (SYSTANE ULTRA PF) 0.4-0.3 % SOLN Place 1 drop into both eyes 4 (four) times daily as needed (Dry eye).    [provider]  polyethylene glycol (MIRALAX / GLYCOLAX) 17 g packet Take 17 g by mouth 2 (two) times daily. Patient taking differently: Take 17 g by mouth daily. 11/30/19   Barb Merino, MD  potassium chloride SA (KLOR-CON M20) 20 MEQ tablet TAKE 1&1/2 TABLETS BY MOUTH DAILY. Patient taking differently: Take 30 mEq by mouth daily. 02/16/20   Martin[place], Peter M, MD  pravastatin (PRAVACHOL) 40 MG tablet Take 40 mg by mouth daily. 05/21/20   [provider]  Probiotic Product (ALIGN PO) Take 1 capsule by mouth 2 (two) times daily.    [provider]  valACYclovir (VALTREX) 500 MG tablet INCREASE TO TAKING 1 TABLET 2 TIMES A DAY FOR 3 DAYS WITH OUTBREAK Patient taking differently: Take 500 mg by mouth daily. 03/25/16   Kem Boroughs, FNP      Allergies    Hydrocodone, Hydrocodone-acetaminophen, and Tramadol    Review of Systems   Review of Systems  Physical Exam Updated Vital Signs  ED Triage Vitals  Enc Vitals Group     BP 11/05/21 0801 139/76     Pulse Rate 11/05/21 0801 63     Resp 11/05/21 0801 18     Temp 11/05/21 0801 98.2 F (36.8 C)     Temp Source 11/05/21 0801 Oral     SpO2 11/05/21 0801 100 %     Weight 11/05/21 0800 150 lb (68 kg)     Height 11/05/21 0800 5' 3.5" (1.613 m)     Head Circumference --      Peak Flow --      Pain Score 11/05/21 0800 10     Pain Loc --      Pain Edu? --      Excl. in [place]? --      Physical Exam Vitals and nursing note reviewed.  Constitutional:      General: She is not in acute distress.    Appearance: She is well-developed. She is not ill-appearing.  HENT:     Head: Normocephalic and atraumatic.     Mouth/Throat:     Mouth: Mucous membranes are moist.  Eyes:     Extraocular Movements: Extraocular movements intact.     Conjunctiva/sclera: Conjunctivae  normal.  Cardiovascular:     Rate and Rhythm: Normal rate and regular rhythm.     Heart sounds: Normal heart sounds. No murmur heard. Pulmonary:     Effort: Pulmonary effort is normal. No respiratory distress.     Breath sounds: Normal breath sounds.  Abdominal:     Palpations: Abdomen is soft.     Tenderness: There is  abdominal tenderness.  Musculoskeletal:        General: No swelling.     Cervical back: Neck supple.  Skin:    General: Skin is warm and dry.     Capillary Refill: Capillary refill takes less than 2 seconds.  Neurological:     Mental Status: She is alert.  Psychiatric:        Mood and Affect: Mood normal.     ED Results / Procedures / Treatments   Labs (all labs ordered are listed, but only abnormal results are displayed) Labs Reviewed  COMPREHENSIVE METABOLIC PANEL - Abnormal; Notable for the following components:      Result Value   Glucose, Bld 169 (*)    BUN 33 (*)    AST 42 (*)    Total Bilirubin 1.6 (*)    All other components within normal limits  CBC WITH DIFFERENTIAL/PLATELET - Abnormal; Notable for the following components:   WBC 12.3 (*)    Neutro Abs 10.3 (*)    All other components within normal limits  SARS CORONAVIRUS 2 BY RT PCR  LIPASE, BLOOD  URINALYSIS, ROUTINE W REFLEX MICROSCOPIC  CBC WITH DIFFERENTIAL/PLATELET    EKG EKG Interpretation  Date/Time:  Tuesday November 05 2021 09:10:59 EDT Ventricular Rate:  133 PR Interval:  173 QRS Duration: 91 QT Interval:  214 QTC Calculation: 321 R Axis:   -71 Text Interpretation: Sinus tachycardia Multiform ventricular premature complexes Confirmed by Lennice Sites ([id number]) on 11/05/2021 9:39:28 AM  Radiology CT CHEST ABDOMEN PELVIS WO CONTRAST  Result Date: 11/05/2021 CLINICAL DATA:  Chest pain or back pain, aortic dissection suspected Aortic aneurysm, known or suspected chest pain, abdominal pain EXAM: CT CHEST, ABDOMEN AND PELVIS WITHOUT CONTRAST TECHNIQUE: Multidetector CT imaging of  the chest, abdomen and pelvis was performed following the standard protocol without IV contrast. RADIATION DOSE REDUCTION: This exam was performed according to the departmental dose-optimization program which includes automated exposure control, adjustment of the mA and/or kV according to patient size and/or use of iterative reconstruction technique. COMPARISON:  Jun 25, 2021 FINDINGS: Evaluation is limited by lack of IV contrast. IV infiltrated during examination. CT CHEST FINDINGS Cardiovascular: Revisualization of aneurysmal dilation of the ascending thoracic aorta to 4.1 x 4.1 cm, similar comparison to prior. Relative preservation of the sino-tubular junction. Atherosclerotic calcifications of the aorta. Evaluation for dissection is limited by lack of IV contrast. No evidence of intramural hematoma. No pericardial effusion. Mediastinum/Nodes: Unchanged appearance of the thyroid. No new axillary mediastinal adenopathy. Similar appearance of a precarinal lymph node measuring 10 mm in the short axis. Lungs/Pleura: No pleural effusion or pneumothorax. Unchanged 4 mm LEFT lower lobe pulmonary nodule for greater than 2 years, consistent with a benign etiology (series 8, image 30). Lingular atelectasis. Musculoskeletal: Degenerative changes of the thoracic spine. CT ABDOMEN PELVIS FINDINGS Hepatobiliary: Revisualization of scattered hepatic cysts. Unremarkable noncontrast appearance of the gallbladder. Pancreas: No peripancreatic fat stranding. Spleen: Unremarkable. Adrenals/Urinary Tract: Adrenal glands are unremarkable. No hydronephrosis. Excreted contrast within the renal collecting systems. Bladder is unremarkable. Stomach/Bowel: There is a small bowel obstruction within the lower abdomen. This is similar in comparison prior. Possible transition point is in the LEFT lower quadrant (series 6, image 63 (series 9, image 98). There is mild adjacent fat stranding of the distended bowel loops. Ileum is decompressed.  Evaluation for ischemia is limited by lack of IV contrast. Small hiatal hernia. Moderate colonic stool burden diffusely throughout the colon. Appendix is normal. Vascular/Lymphatic: Atherosclerotic calcifications of the  nonaneurysmal abdominal aorta. No new suspicious lymphadenopathy. Reproductive: Uterus and bilateral adnexa are unremarkable. Other: No free air. Musculoskeletal: Grade 1 anterolisthesis of L4-5. IMPRESSION: 1. Small bowel obstruction. Question transition point in the LEFT lower quadrant. There is fat stranding adjacent to the bowel loops without evidence of pneumatosis or definitive evidence of bowel ischemia at this point in time. Recommend correlation with lactate levels. 2. Similar appearance of a mild ascending thoracic aortic aneurysm to 4.1 cm. Recommend annual imaging followup by CTA or MRA. This recommendation follows 2010 ACCF/AHA/AATS/ACR/ASA/SCA/SCAI/SIR/STS/SVM Guidelines for the Diagnosis and Management of Patients with Thoracic Aortic Disease. Circulation. 2010; 121: C944-H675. Aortic aneurysm NOS (ICD10-I71.9) Electronically Signed   By: Valentino Saxon M.D.   On: 11/05/2021 12:13    Procedures Procedures    Medications Ordered in ED Medications  sodium chloride 0.9 % bolus 1,000 mL (1,000 mLs Intravenous New Bag/Given 11/05/21 0855)  ondansetron (ZOFRAN) injection 4 mg (4 mg Intravenous Given 11/05/21 0856)  fentaNYL (SUBLIMAZE) injection 50 mcg (50 mcg Intravenous Given 11/05/21 0856)  iohexol (OMNIPAQUE) 350 MG/ML injection 100 mL (100 mLs Intravenous Contrast Given 11/05/21 0940)    ED Course/ Medical Decision Making/ A&P                           Medical Decision Making Amount and/or Complexity of Data Reviewed Labs: ordered. Radiology: ordered.  Risk Prescription drug management. Decision regarding hospitalization.   Cynthia Peters is here with abdominal pain.  Normal vitals.  No fever.  Differential diagnosis is bowel obstruction versus UTI versus  GI related process, possible colitis, acid reflux, could be viral process.  She does have history of bowel obstruction, known thoracic aneurysm.  Clinically does not seem like it is a dissection or thoracic/cardaic in nature but will evaluate with CT scan given the need for CT ab/pelvis to eval to obstruction.  Will check EKG but very low suspicion for ACS.  Will check CBC, CMP, lipase, urinalysis, CT scan abdomen pelvis.  Will give IV fluids, IV Zofran and reevaluate.  IV infiltrated while getting CT scan.  Overall clinically have a very low suspicion for dissection.  CT scan a few months ago showed mild aneurysmal dilation of the thoracic aorta at 4.1 cm.  I will wait for radiology report to make decision if I need to repeat scan with IV contrast but at this time main concern is lower abdominal cramping.  She not having any active chest pain or upper back pain.  CT scan shows small bowel obstruction.  Talked with general surgery team who will follow along.  At this time symptoms are improving.  Can hold off on NG tube.  Patient made NPO.  To be admitted to medicine for further care.  This chart was dictated using voice recognition software.  Despite best efforts to proofread,  errors can occur which can change the documentation meaning.         Final Clinical Impression(s) / ED Diagnoses Final diagnoses:  SBO (small bowel obstruction) [facility])    Rx / DC Orders ED Discharge Orders     None         Lennice Sites, DO 11/05/21 1232

## 2021-11-05 NOTE — Assessment & Plan Note (Signed)
Stable.  Continue pravastatin 40 mg daily. 

## 2021-11-05 NOTE — ED Notes (Addendum)
IV infiltrated during CT with contrast.  Able to obtain CT without. EDP notified. Cool compress placed by CT tech . Iv insertion site with swelling and mild bruising. Palpable radial pulse and brisk cap refill

## 2021-11-05 NOTE — Assessment & Plan Note (Signed)
Admit to obs med. Currently no N/V/abd pain. Per EDP notes, general surgery instructed to hold off on NG tube. Continue with NPO status except ice chips. Continue with IVF. Prn dilaudid for pain. Pt not having any pain now. KUB in AM. Pt states she has passed some flatus since her CT abd/pelvis this afternoon.

## 2021-11-05 NOTE — Subjective & Objective (Signed)
CC: abd pain HPI: 71 year old female history of hypertension, TIAs, thoracic aneurysm, recurrent small bowel obstructions presents to the ER today with nausea, abdominal pain since last night.  Patient is ready be admitted twice in 2023 for bowel obstruction.  He has had multiple lysis of adhesions surgeries due to bowel obstructions.  Last had been treated conservatively without need for operative intervention.  Last lyse of adhesions was back in 2017.  On arrival to ER temp 90.2 heart rate 63 blood pressure 139/76 satting 100% on room air.  White count 12.3, hemoglobin 13.4, platelets of 199  Sodium 138, potassium 4.0, BUN 33 creatinine 0.95  CT scan chest abdomen pelvis demonstrates a 4.1 x 4.9 ascending thoracic aneurysm also showed small bowel obstruction in the left lower quadrant without evidence of pneumatosis.  General surgery was contacted by EDP.  Recommended patient be admitted to medicine.  They also recommended no NG tube placement patient has not had any vomiting.  Patient's IV infiltrated during her CAT scan.  Patient states that she has been passing flatus since her CAT scan this afternoon.  Patient has been seen by Dr.  Rosendo Gros before for small bowel obstruction.  [facility] hospitalist contacted for admission.

## 2021-11-05 NOTE — H&P (Signed)
History and Physical    GER NICKS WUX:[id number] DOB: 06/23/1950 DOA: 11/05/2021  DOS: the patient was seen and examined on 11/05/2021  PCP: Aletha Halim., PA-C   Patient coming from: Home  I have personally briefly reviewed patient's old medical records in [place]  CC: abd pain HPI: 71 year old female history of hypertension, TIAs, thoracic aneurysm, recurrent small bowel obstructions presents to the ER today with nausea, abdominal pain since last night.  Patient is ready be admitted twice in 2023 for bowel obstruction.  He has had multiple lysis of adhesions surgeries due to bowel obstructions.  Last had been treated conservatively without need for operative intervention.  Last lyse of adhesions was back in 2017.  On arrival to ER temp 90.2 heart rate 63 blood pressure 139/76 satting 100% on room air.  White count 12.3, hemoglobin 13.4, platelets of 199  Sodium 138, potassium 4.0, BUN 33 creatinine 0.95  CT scan chest abdomen pelvis demonstrates a 4.1 x 4.9 ascending thoracic aneurysm also showed small bowel obstruction in the left lower quadrant without evidence of pneumatosis.  General surgery was contacted by EDP.  Recommended patient be admitted to medicine.  They also recommended no NG tube placement patient has not had any vomiting.  Patient's IV infiltrated during her CAT scan.  Patient states that she has been passing flatus since her CAT scan this afternoon.  Patient has been seen by Dr.  Rosendo Gros before for small bowel obstruction.  [facility] hospitalist contacted for admission.       ED Course: CT chest/abd/pelvis shows LLQ SBO obstruction  Review of Systems:  Review of Systems  Constitutional: Negative.   HENT: Negative.    Eyes: Negative.   Respiratory: Negative.    Cardiovascular: Negative.   Gastrointestinal:  Positive for abdominal pain and nausea. Negative for blood in stool and melena.  Genitourinary: Negative.   Musculoskeletal:  Negative.   Skin: Negative.   Neurological: Negative.   Endo/Heme/Allergies: Negative.   Psychiatric/Behavioral: Negative.    All other systems reviewed and are negative.   Past Medical History:  Diagnosis Date   Anemia    history in the past, not on a regular basis   Arthritis    Bronchitis    Cataracts, bilateral    immature   Claustrophobia    takes Valium daily as needed   Complication of anesthesia    2013 SURGERY vocal cord bothering pt, used smaller ent tube after 2 hernia repair, no problem after . " I woke up during my MRI and colonoscopy."        Depression    takes Wellbutrin daily   Dry eye    Dry mouth    GERD (gastroesophageal reflux disease)    takes Omeprazole daily    H/O hiatal hernia    Headache    migraines with flashing lights    History of blood clots 40 yrs    leg    History of echocardiogram 2013   a. Echo (05/2011):  Mild LVH, EF 65-70%, no WMA, Gr 1 DD, mild AI.   History of MRSA infection    History of staph infection    History of stress test 2010   a. ETT-Echo in 2010 was normal   History of TIA (transient ischemic attack) 2013   a. Carotid US (05/2011):  No ICA stenosis, pt unsure of this   Hyperlipidemia    takes Simvastatin daily    Hypertension    takes  Losartan daily  IBS (irritable bowel syndrome)    takes Bentyl daily as needed   Insomnia    takes Ambien nightly   Interstitial cystitis    Lichen planus    Mild asthma    Albuterol inhaler as needed   Palpitations    Event monitor in 2007 with rare PVCs // takes Metoprolol daily   Partial small bowel obstruction ([place]) 07/16/2017   PVC (premature ventricular contraction)    Right radial head fracture 06/17/2016   Seasonal allergies    takes Claritin daily as needed.Takes Singulair daily as needed.   TIA (transient ischemic attack) 05/27/2011   Urethral stricture    Vertigo    takes Meclizine daily as needed    Past Surgical History:  Procedure Laterality Date   ARTHROTOMY  Right 09/21/2020   Procedure: Right elbow open arthrotomy and synovectomy with radial head implant removal and repair reconstruction as necessary including ligamentous repair;  Surgeon: Roseanne Kaufman, MD;  Location: [facility];  Service: Orthopedics;  Laterality: Right;  170mn   CESAREAN SECTION     COLONOSCOPY WITH PROPOFOL N/A 08/10/2012   Procedure: COLONOSCOPY WITH PROPOFOL;  Surgeon: MGarlan Fair MD;  Location: [facility];  Service: Endoscopy;  Laterality: N/A;   CYSTOSCOPY WITH URETHRAL DILATATION N/A 02/18/2013   Procedure: CYSTOSCOPY WITH URETHRAL DILATATION ( NO BALLOON) AND HYDRODISTENSION;  Surgeon: TAlexis Frock MD;  [facility]  Service: Urology;  Laterality: N/A;   ENDOMETRIAL ABLATION W/ HYDROTHERMABLATOR  09-27-2002   INCISIONAL HERNIA REPAIR N/A 10/25/2014   Procedure: HERNIA REPAIR INCISIONAL;  Surgeon: ARalene Ok MD;  [place] [facility]  Service: General;  Laterality: N/A;   [place] 03/26/2015   Procedure: LAPAROSCOPIC INCISIONAL HERNIA;  Surgeon: ARalene Ok MD;  [place] [facility]  Service: General;  Laterality: N/A;   INSERTION OF MESH N/A 10/25/2014   Procedure: INSERTION OF MESH;  Surgeon: ARalene Ok MD;  [place] [facility]  Service: General;  Laterality: N/A;   LAPAROSCOPIC LYSIS OF ADHESIONS N/A 03/26/2015   Procedure: LAPAROSCOPIC LYSIS OF ADHESIONS;  Surgeon: ARalene Ok MD;  [place] [facility]  Service: General;  Laterality: N/A;   LAPAROTOMY N/A 10/25/2014   Procedure: EXPLORATORY LAPAROTOMY;  Surgeon: ARalene Ok MD;  [place] [facility]  Service: General;  Laterality: N/A;   LYSIS OF ADHESION N/A 10/25/2014   Procedure: LYSIS OF ADHESIONS ;  Surgeon: ARalene Ok MD;  [place] [facility]  Service: General;  Laterality: N/A;   MUCOSAL ADVANCEMENT FLAP N/A 10/25/2014   Procedure: MUCOSAL ADVANCEMENT FLAP;  Surgeon: ARalene Ok MD;  [place] [facility]  Service: General;  Laterality: N/A;   ORIF RADIAL  FRACTURE Right 06/17/2016   Procedure: right radial head arthroplasty with ligament repair, reconstruciton;  Surgeon: GRoseanne Kaufman MD;  [place]  Service: Orthopedics;  Laterality: Right;   RADIAL HEAD ARTHROPLASTY Right 06/17/2016   TOOTH EXTRACTION     TRANSTHORACIC ECHOCARDIOGRAM  05-26-2011   MILD LVH/  EF 657-84%/ GRADE I DIASTOLIC DYSFUNCTION/  MILD AR//   06-20-2008  NOMRAL STRESS ECHO   UMBILICAL HERNIA REPAIR  2013   AND REVISION THE SAME YEAR W/ MESH   WISDOM TOOTH EXTRACTION  AGE 60     reports that she quit smoking about 45 years ago. Her smoking use included cigarettes. She has a 0.50 pack-year smoking history. She has never used smokeless tobacco. She reports current alcohol use. She reports that she does not use drugs.  Allergies  Allergen Reactions  Hydrocodone Rash   Hydrocodone-Acetaminophen Rash   Tramadol Hives and Palpitations    Family History  Problem Relation Age of Onset   Dementia Mother    Leukemia Child    Cirrhosis Father     Prior to Admission medications   Medication Sig Start Date End Date Taking? Authorizing Provider  acetaminophen (TYLENOL) 500 MG tablet Take 1,000 mg by mouth every 6 (six) hours as needed (pain).     [provider]  albuterol (VENTOLIN HFA) 108 (90 Base) MCG/ACT inhaler Inhale 2 puffs into the lungs every 6 (six) hours as needed for wheezing or shortness of breath.    [provider]  amLODipine (NORVASC) 5 MG tablet Take 5 mg by mouth daily. 05/21/20   [provider]  aspirin 81 MG chewable tablet Chew 81 mg by mouth daily.    [provider]  buPROPion (WELLBUTRIN XL) 300 MG 24 hr tablet Take 300 mg by mouth daily.    [provider]  Cholecalciferol (VITAMIN D) 2000 units CAPS Take 2,000 Units by mouth daily.    [provider]  clobetasol cream (TEMOVATE) 0.16 % Apply 1 application topically 2 (two) times daily as needed (rash).  04/01/16   [provider]  Coenzyme Q10 (CO Q 10) 100 MG CAPS Take 100 mg by mouth daily.     [provider]  [employer] VITAMIN C 1000 MG tablet Take 2,000 mg by mouth 2 (two) times daily. 06/13/16   [provider]  Evening Primrose Oil 1000 MG CAPS Take 1,000 mg by mouth daily as needed (dark spots).    [provider]  fexofenadine (ALLEGRA) 180 MG tablet Take 180 mg by mouth daily.    [provider]  Flaxseed, Linseed, 1000 MG CAPS Take 1,000 mg by mouth daily.    [provider]  fluticasone (FLONASE) 50 MCG/ACT nasal spray Place 2 sprays into both nostrils daily as needed for allergies or rhinitis.    [provider]  hydrochlorothiazide 25 MG tablet Take 25 mg by mouth daily.    [provider]  hydrocortisone cream 1 % Apply 1 application topically 4 (four) times daily as needed for itching.    [provider]  ibuprofen (ADVIL) 200 MG tablet Take 200-400 mg by mouth every 6 (six) hours as needed for mild pain.    [provider]  losartan (COZAAR) 100 MG tablet TAKE 1 TABLET BY MOUTH EVERY DAY Patient taking differently: Take 100 mg by mouth daily. 09/14/17   Martin[place], Peter M, MD  magnesium oxide (MAG-OX) 400 MG tablet Take 400 mg by mouth daily.    [provider]  meclizine (ANTIVERT) 25 MG tablet Take 1 tablet (25 mg total) by mouth 3 (three) times daily as needed for dizziness. 05/02/20   Robinson, [place] N, PA-C  metoprolol succinate (TOPROL-XL) 100 MG 24 hr tablet Take 1 tablet (100 mg total) by mouth 2 (two) times daily. Take with or immediately following a meal. Take 100 mg by mouth 2 (two) times daily. Take with or immediately following a meal. Patient taking differently: Take 100 mg by mouth 2 (two) times daily. Take with or immediately following a meal. 07/14/18   [place], Peter M, MD  montelukast (SINGULAIR) 10 MG tablet Take 10 mg by mouth at bedtime.    [provider]  Multiple Vitamins-Minerals (MULTIPLE  VITAMINS/WOMENS PO) Take 1 tablet by mouth daily.    [provider]  mupirocin ointment (BACTROBAN) 2 %  Apply 1 application topically 3 (three) times daily as needed for rash. 06/20/20   [provider]  pantoprazole (PROTONIX) 20 MG tablet Take 20 mg by mouth daily. 05/21/20   [provider]  Polyethyl Glyc-Propyl Glyc PF (SYSTANE ULTRA PF) 0.4-0.3 % SOLN Place 1 drop into both eyes 4 (four) times daily as needed (Dry eye).    [provider]  polyethylene glycol (MIRALAX / GLYCOLAX) 17 g packet Take 17 g by mouth 2 (two) times daily. Patient taking differently: Take 17 g by mouth daily. 11/30/19   Barb Merino, MD  potassium chloride SA (KLOR-CON M20) 20 MEQ tablet TAKE 1&1/2 TABLETS BY MOUTH DAILY. Patient taking differently: Take 30 mEq by mouth daily. 02/16/20   [place], Peter M, MD  pravastatin (PRAVACHOL) 40 MG tablet Take 40 mg by mouth daily. 05/21/20   [provider]  Probiotic Product (ALIGN PO) Take 1 capsule by mouth 2 (two) times daily.    [provider]  valACYclovir (VALTREX) 500 MG tablet INCREASE TO TAKING 1 TABLET 2 TIMES A DAY FOR 3 DAYS WITH OUTBREAK Patient taking differently: Take 500 mg by mouth daily. 03/25/16   Kem Boroughs, FNP    Physical Exam: Vitals:   11/05/21 1736 11/05/21 2015 11/05/21 2144 11/05/21 2244  BP: (!) 120/51 133/67  (!) 140/73  Pulse: 67 63  67  Resp: '16 17  18  '$ Temp: 98.5 F (36.9 C)  98.4 F (36.9 C) 98.2 F (36.8 C)  TempSrc: Oral  Oral Oral  SpO2: 97% 95%  99%  Weight:      Height:        Physical Exam Vitals and nursing note reviewed.  Constitutional:      General: She is not in acute distress.    Appearance: Normal appearance. She is normal weight. She is not ill-appearing, toxic-appearing or diaphoretic.  HENT:     Head: Normocephalic and atraumatic.     Nose: Nose normal.  Eyes:     General: No scleral icterus. Cardiovascular:     Rate and Rhythm: Normal rate and  regular rhythm.     Pulses: Normal pulses.  Pulmonary:     Effort: Pulmonary effort is normal. No respiratory distress.     Breath sounds: Normal breath sounds. No wheezing or rales.  Abdominal:     Palpations: Abdomen is soft.     Tenderness: There is abdominal tenderness in the left lower quadrant. There is no guarding or rebound.     Comments: Bowel sounds hypoactive in LLQ Normal BS in RLQ  Musculoskeletal:     Right lower leg: No edema.     Left lower leg: No edema.  Skin:    General: Skin is warm and dry.     Capillary Refill: Capillary refill takes less than 2 seconds.  Neurological:     General: No focal deficit present.     Mental Status: She is alert and oriented to person, place, and time.      Labs on Admission: I have personally reviewed following labs and imaging studies  CBC: Recent Labs  Lab 11/05/21 0920  WBC 12.3*  NEUTROABS 10.3*  HGB 13.4  HCT 37.6  MCV 92.2  PLT 751   Basic Metabolic Panel: Recent Labs  Lab 11/05/21 0843  NA 138  K 4.0  CL 102  CO2 26  GLUCOSE 169*  BUN 33*  CREATININE 0.95  CALCIUM 9.5   GFR: Estimated Creatinine Clearance: 52.4 mL/min (by C-G formula based  on SCr of 0.95 mg/dL). Liver Function Tests: Recent Labs  Lab 11/05/21 0843  AST 42*  ALT 27  ALKPHOS 69  BILITOT 1.6*  PROT 7.5  ALBUMIN 4.5   Recent Labs  Lab 11/05/21 0843  LIPASE 31   No results for input(s): "AMMONIA" in the last 168 hours. Coagulation Profile: No results for input(s): "INR", "PROTIME" in the last 168 hours. Cardiac Enzymes: No results for input(s): "CKTOTAL", "CKMB", "CKMBINDEX", "TROPONINI", "TROPONINIHS" in the last 168 hours. BNP (last 3 results) No results for input(s): "PROBNP" in the last 8760 hours. HbA1C: No results for input(s): "HGBA1C" in the last 72 hours. CBG: No results for input(s): "GLUCAP" in the last 168 hours. Lipid Profile: No results for input(s): "CHOL", "HDL", "LDLCALC", "TRIG", "CHOLHDL", "LDLDIRECT"  in the last 72 hours. Thyroid Function Tests: No results for input(s): "TSH", "T4TOTAL", "FREET4", "T3FREE", "THYROIDAB" in the last 72 hours. Anemia Panel: No results for input(s): "VITAMINB12", "FOLATE", "FERRITIN", "TIBC", "IRON", "RETICCTPCT" in the last 72 hours. Urine analysis:    Component Value Date/Time   COLORURINE YELLOW 11/05/2021 0805   APPEARANCEUR CLEAR 11/05/2021 0805   LABSPEC >=1.030 11/05/2021 0805   PHURINE 6.0 11/05/2021 0805   GLUCOSEU NEGATIVE 11/05/2021 0805   HGBUR NEGATIVE 11/05/2021 0805   BILIRUBINUR NEGATIVE 11/05/2021 0805   BILIRUBINUR n 10/20/2014 0949   KETONESUR NEGATIVE 11/05/2021 0805   PROTEINUR NEGATIVE 11/05/2021 0805   UROBILINOGEN negative 10/20/2014 0949   UROBILINOGEN 0.2 09/09/2014 0942   NITRITE NEGATIVE 11/05/2021 0805   LEUKOCYTESUR NEGATIVE 11/05/2021 0805    Radiological Exams on Admission: I have personally reviewed images CT CHEST ABDOMEN PELVIS WO CONTRAST  Result Date: 11/05/2021 CLINICAL DATA:  Chest pain or back pain, aortic dissection suspected Aortic aneurysm, known or suspected chest pain, abdominal pain EXAM: CT CHEST, ABDOMEN AND PELVIS WITHOUT CONTRAST TECHNIQUE: Multidetector CT imaging of the chest, abdomen and pelvis was performed following the standard protocol without IV contrast. RADIATION DOSE REDUCTION: This exam was performed according to the departmental dose-optimization program which includes automated exposure control, adjustment of the mA and/or kV according to patient size and/or use of iterative reconstruction technique. COMPARISON:  Jun 25, 2021 FINDINGS: Evaluation is limited by lack of IV contrast. IV infiltrated during examination. CT CHEST FINDINGS Cardiovascular: Revisualization of aneurysmal dilation of the ascending thoracic aorta to 4.1 x 4.1 cm, similar comparison to prior. Relative preservation of the sino-tubular junction. Atherosclerotic calcifications of the aorta. Evaluation for dissection is  limited by lack of IV contrast. No evidence of intramural hematoma. No pericardial effusion. Mediastinum/Nodes: Unchanged appearance of the thyroid. No new axillary mediastinal adenopathy. Similar appearance of a precarinal lymph node measuring 10 mm in the short axis. Lungs/Pleura: No pleural effusion or pneumothorax. Unchanged 4 mm LEFT lower lobe pulmonary nodule for greater than 2 years, consistent with a benign etiology (series 8, image 30). Lingular atelectasis. Musculoskeletal: Degenerative changes of the thoracic spine. CT ABDOMEN PELVIS FINDINGS Hepatobiliary: Revisualization of scattered hepatic cysts. Unremarkable noncontrast appearance of the gallbladder. Pancreas: No peripancreatic fat stranding. Spleen: Unremarkable. Adrenals/Urinary Tract: Adrenal glands are unremarkable. No hydronephrosis. Excreted contrast within the renal collecting systems. Bladder is unremarkable. Stomach/Bowel: There is a small bowel obstruction within the lower abdomen. This is similar in comparison prior. Possible transition point is in the LEFT lower quadrant (series 6, image 63 (series 9, image 98). There is mild adjacent fat stranding of the distended bowel loops. Ileum is decompressed. Evaluation for ischemia is limited by lack of IV contrast. Small hiatal  hernia. Moderate colonic stool burden diffusely throughout the colon. Appendix is normal. Vascular/Lymphatic: Atherosclerotic calcifications of the nonaneurysmal abdominal aorta. No new suspicious lymphadenopathy. Reproductive: Uterus and bilateral adnexa are unremarkable. Other: No free air. Musculoskeletal: Grade 1 anterolisthesis of L4-5. IMPRESSION: 1. Small bowel obstruction. Question transition point in the LEFT lower quadrant. There is fat stranding adjacent to the bowel loops without evidence of pneumatosis or definitive evidence of bowel ischemia at this point in time. Recommend correlation with lactate levels. 2. Similar appearance of a mild ascending thoracic  aortic aneurysm to 4.1 cm. Recommend annual imaging followup by CTA or MRA. This recommendation follows 2010 ACCF/AHA/AATS/ACR/ASA/SCA/SCAI/SIR/STS/SVM Guidelines for the Diagnosis and Management of Patients with Thoracic Aortic Disease. Circulation. 2010; 121: Z858-I502. Aortic aneurysm NOS (ICD10-I71.9) Electronically Signed   By: Valentino Saxon M.D.   On: 11/05/2021 12:13    EKG: My personal interpretation of EKG shows: sinus tachycardia    Assessment/Plan Principal Problem:   SBO (small bowel obstruction) ([facility]) - recurrent Active Problems:   Hypertension   Hyperlipidemia   Thoracic aortic aneurysm ([facility]CC)    Assessment and Plan: * SBO (small bowel obstruction) ([facility]) - recurrent Admit to obs med. Currently no N/V/abd pain. Per EDP notes, general surgery instructed to hold off on NG tube. Continue with NPO status except ice chips. Continue with IVF. Prn dilaudid for pain. Pt not having any pain now. KUB in AM. Pt states she has passed some flatus since her CT abd/pelvis this afternoon.  Thoracic aortic aneurysm (HCC) Stable.  Hyperlipidemia Stable. Continue pravastatin 40 mg daily.  Hypertension Stable. Continue HTN meds.   DVT prophylaxis: SCDs Code Status: Full Code Family Communication: no family at bedside  Disposition Plan: return home Consults called: EDP has contacted general surgery. I have placed pt on general surgery consult list and sent secure chat to general surgery group. Admission status: Observation, Telemetry bed   Kristopher Oppenheim, DO [facility] 11/05/2021, 11:56 PM

## 2021-11-05 NOTE — Consult Note (Signed)
[facility] Surgery Consult Note  Cynthia Peters [birth year]  [id number].    Requesting MD: Lennice Sites Chief Complaint/Reason for Consult: SBO  HPI:  Cynthia Peters is a 71 y.o. female with a history of multiple prior ventral hernia repairs including repair with TAR/ mesh by Dr. Rosendo Gros in 2017, bilateral posterior musculocutaneous flaps Hernia repair with mesh by Dr. Rosendo Gros in 2016, as well as 2 other prior hernia repairs, who presented to [facility] today complaining of generalized abdominal pain. She has had multiple postoperative SBOs. Most recent admission was in May 2023. She had 1 other prior admission earlier this year, as well as 5 others over the last several years. She was scheduled to follow up in our office to discuss diagnostic laparoscopy given the chronicity of her issue, but she did not follow up.  Symptoms this time started two nights ago. Pain was mostly in her upper abdomen and slightly different than prior SBOs. She had some nausea, no emesis. She was still having bowel function at the time. States that she received 1 dose of fentanyl at [facility] and since then the pain has resolved. She is passing flatus this morning. Denies n/v. CT abdomen/ pelvis shows SBO with question of transition point in the left lower quadrant, there is fat stranding adjacent to the bowel loops without evidence of pneumatosis or definitive evidence of bowel ischemia. WBC 12.3. Vital signs stable. General surgery asked to see.  Anticoagulants: none Former smoker, quit in [place] alcohol occasionally Denies illicit drug use Employment: retired Lives at home alone   Family History  Problem Relation Age of Onset   Dementia Mother    Leukemia Child    Cirrhosis Father     Past Medical History:  Diagnosis Date   Anemia    history in the past, not on a regular basis   Arthritis    Bronchitis    Cataracts, bilateral    immature   Claustrophobia    takes Valium daily as needed    Complication of anesthesia    2013 SURGERY vocal cord bothering pt, used smaller ent tube after 2 hernia repair, no problem after . " I woke up during my MRI and colonoscopy."        Depression    takes Wellbutrin daily   Dry eye    Dry mouth    GERD (gastroesophageal reflux disease)    takes Omeprazole daily    H/O hiatal hernia    Headache    migraines with flashing lights    History of blood clots 40 yrs    leg    History of echocardiogram 2013   a. Echo (05/2011):  Mild LVH, EF 65-70%, no WMA, Gr 1 DD, mild AI.   History of MRSA infection    History of staph infection    History of stress test 2010   a. ETT-Echo in 2010 was normal   History of TIA (transient ischemic attack) 2013   a. Carotid US (05/2011):  No ICA stenosis, pt unsure of this   Hyperlipidemia    takes Simvastatin daily    Hypertension    takes  Losartan daily   IBS (irritable bowel syndrome)    takes Bentyl daily as needed   Insomnia    takes Ambien nightly   Interstitial cystitis    Lichen planus    Mild asthma    Albuterol inhaler as needed   Palpitations    Event monitor in 2007 with rare PVCs //  takes Metoprolol daily   PVC (premature ventricular contraction)    Seasonal allergies    takes Claritin daily as needed.Takes Singulair daily as needed.   TIA (transient ischemic attack) 05/27/2011   Urethral stricture    Vertigo    takes Meclizine daily as needed    Past Surgical History:  Procedure Laterality Date   ARTHROTOMY Right 09/21/2020   Procedure: Right elbow open arthrotomy and synovectomy with radial head implant removal and repair reconstruction as necessary including ligamentous repair;  Surgeon: Roseanne Kaufman, MD;  Location: [place];  Service: Orthopedics;  Laterality: Right;  117mn   CESAREAN SECTION     COLONOSCOPY WITH PROPOFOL N/A 08/10/2012   Procedure: COLONOSCOPY WITH PROPOFOL;  Surgeon: MGarlan Fair MD;  Location: [facility];  Service: Endoscopy;  Laterality: N/A;    CYSTOSCOPY WITH URETHRAL DILATATION N/A 02/18/2013   Procedure: CYSTOSCOPY WITH URETHRAL DILATATION ( NO BALLOON) AND HYDRODISTENSION;  Surgeon: TAlexis Frock MD;  [facility]  Service: Urology;  Laterality: N/A;   ENDOMETRIAL ABLATION W/ HYDROTHERMABLATOR  09-27-2002   INCISIONAL HERNIA REPAIR N/A 10/25/2014   Procedure: HERNIA REPAIR INCISIONAL;  Surgeon: ARalene Ok MD;  [place]  Service: General;  Laterality: N/A;   IEighty FourN/A 03/26/2015   Procedure: LAPAROSCOPIC INCISIONAL HERNIA;  Surgeon: ARalene Ok MD;  [place]  Service: General;  Laterality: N/A;   INSERTION OF MESH N/A 10/25/2014   Procedure: INSERTION OF MESH;  Surgeon: ARalene Ok MD;  [place]  Service: General;  Laterality: N/A;   LAPAROSCOPIC LYSIS OF ADHESIONS N/A 03/26/2015   Procedure: LAPAROSCOPIC LYSIS OF ADHESIONS;  Surgeon: ARalene Ok MD;  [place]  Service: General;  Laterality: N/A;   LAPAROTOMY N/A 10/25/2014   Procedure: EXPLORATORY LAPAROTOMY;  Surgeon: ARalene Ok MD;  [place]  Service: General;  Laterality: N/A;   LYSIS OF ADHESION N/A 10/25/2014   Procedure: LYSIS OF ADHESIONS ;  Surgeon: ARalene Ok MD;  [place]  Service: General;  Laterality: N/A;   MUCOSAL ADVANCEMENT FLAP N/A 10/25/2014   Procedure: MUCOSAL ADVANCEMENT FLAP;  Surgeon: ARalene Ok MD;  [place]  Service: General;  Laterality: N/A;   ORIF RADIAL FRACTURE Right 06/17/2016   Procedure: right radial head arthroplasty with ligament repair, reconstruciton;  Surgeon: GRoseanne Kaufman MD;  [place]  Service: Orthopedics;  Laterality: Right;   RADIAL HEAD ARTHROPLASTY Right 06/17/2016   TOOTH EXTRACTION     TRANSTHORACIC ECHOCARDIOGRAM  05-26-2011   MILD LVH/  EF 602-54%/ GRADE I DIASTOLIC DYSFUNCTION/  MILD AR//   06-20-2008  NOMRAL STRESS ECHO   UMBILICAL HERNIA REPAIR  2013   AND REVISION THE SAME YEAR W/ MESH    WISDOM TOOTH EXTRACTION  AGE 61    Social History:  reports that she quit smoking about 45 years ago. Her smoking use included cigarettes. She has a 0.50 pack-year smoking history. She has never used smokeless tobacco. She reports current alcohol use. She reports that she does not use drugs.  Allergies:  Allergies  Allergen Reactions   Hydrocodone Rash   Hydrocodone-Acetaminophen Rash   Tramadol Hives and Palpitations    (Not in a hospital admission)   Prior to Admission medications   Medication Sig Start Date End Date Taking? Authorizing Provider  acetaminophen (TYLENOL) 500 MG tablet Take 1,000 mg by mouth every 6 (six) hours as needed (pain).     [provider]  albuterol (VENTOLIN HFA)  108 (90 Base) MCG/ACT inhaler Inhale 2 puffs into the lungs every 6 (six) hours as needed for wheezing or shortness of breath.    [provider]  amLODipine (NORVASC) 5 MG tablet Take 5 mg by mouth daily. 05/21/20   [provider]  aspirin 81 MG chewable tablet Chew 81 mg by mouth daily.    [provider]  buPROPion (WELLBUTRIN XL) 300 MG 24 hr tablet Take 300 mg by mouth daily.    [provider]  Cholecalciferol (VITAMIN D) 2000 units CAPS Take 2,000 Units by mouth daily.    [provider]  clobetasol cream (TEMOVATE) 2.99 % Apply 1 application topically 2 (two) times daily as needed (rash).  04/01/16   [provider]  Coenzyme Q10 (CO Q 10) 100 MG CAPS Take 100 mg by mouth daily.     [provider]  CVS VITAMIN C 1000 MG tablet Take 2,000 mg by mouth 2 (two) times daily. 06/13/16   [provider]  Evening Primrose Oil 1000 MG CAPS Take 1,000 mg by mouth daily as needed (dark spots).    [provider]  fexofenadine (ALLEGRA) 180 MG tablet Take 180 mg by mouth daily.    [provider]  Flaxseed, Linseed, 1000 MG CAPS Take 1,000 mg by mouth daily.    [provider]  fluticasone  (FLONASE) 50 MCG/ACT nasal spray Place 2 sprays into both nostrils daily as needed for allergies or rhinitis.    [provider]  hydrochlorothiazide 25 MG tablet Take 25 mg by mouth daily.    [provider]  hydrocortisone cream 1 % Apply 1 application topically 4 (four) times daily as needed for itching.    [provider]  ibuprofen (ADVIL) 200 MG tablet Take 200-400 mg by mouth every 6 (six) hours as needed for mild pain.    [provider]  losartan (COZAAR) 100 MG tablet TAKE 1 TABLET BY MOUTH EVERY DAY Patient taking differently: Take 100 mg by mouth daily. 09/14/17   Martinique, Peter M, MD  magnesium oxide (MAG-OX) 400 MG tablet Take 400 mg by mouth daily.    [provider]  meclizine (ANTIVERT) 25 MG tablet Take 1 tablet (25 mg total) by mouth 3 (three) times daily as needed for dizziness. 05/02/20   Robinson, [place] N, PA-C  metoprolol succinate (TOPROL-XL) 100 MG 24 hr tablet Take 1 tablet (100 mg total) by mouth 2 (two) times daily. Take with or immediately following a meal. Take 100 mg by mouth 2 (two) times daily. Take with or immediately following a meal. Patient taking differently: Take 100 mg by mouth 2 (two) times daily. Take with or immediately following a meal. 07/14/18   Martinique, Peter M, MD  montelukast (SINGULAIR) 10 MG tablet Take 10 mg by mouth at bedtime.    [provider]  Multiple Vitamins-Minerals (MULTIPLE VITAMINS/WOMENS PO) Take 1 tablet by mouth daily.    [provider]  mupirocin ointment (BACTROBAN) 2 % Apply 1 application topically 3 (three) times daily as needed for rash. 06/20/20   [provider]  pantoprazole (PROTONIX) 20 MG tablet Take 20 mg by mouth daily. 05/21/20   [provider]  Polyethyl Glyc-Propyl Glyc PF (SYSTANE ULTRA PF) 0.4-0.3 % SOLN Place 1 drop into both eyes 4 (four) times daily as needed (Dry eye).    [provider]  polyethylene glycol (MIRALAX / GLYCOLAX)  17 g packet Take 17 g by mouth 2 (two) times  daily. Patient taking differently: Take 17 g by mouth daily. 11/30/19   Barb Merino, MD  potassium chloride SA (KLOR-CON M20) 20 MEQ tablet TAKE 1&1/2 TABLETS BY MOUTH DAILY. Patient taking differently: Take 30 mEq by mouth daily. 02/16/20   Martinique, Peter M, MD  pravastatin (PRAVACHOL) 40 MG tablet Take 40 mg by mouth daily. 05/21/20   [provider]  Probiotic Product (ALIGN PO) Take 1 capsule by mouth 2 (two) times daily.    [provider]  valACYclovir (VALTREX) 500 MG tablet INCREASE TO TAKING 1 TABLET 2 TIMES A DAY FOR 3 DAYS WITH OUTBREAK Patient taking differently: Take 500 mg by mouth daily. 03/25/16   Kem Boroughs, FNP    Blood pressure 122/68, pulse 71, temperature 97.6 F (36.4 C), temperature source Oral, resp. rate 10, height 5' 3.5" (1.613 m), weight 68 kg, last menstrual period 02/04/2004, SpO2 97 %. Physical Exam: General: pleasant, WD/WN female who is laying in bed in NAD HEENT: head is normocephalic, atraumatic.  Sclera are noninjected.  Pupils equal and round.  Ears and nose without any masses or lesions.  Mouth is pink and moist. Dentition fair Heart: regular, rate, and rhythm Lungs: CTAB, no wheezes, rhonchi, or rales noted.  Respiratory effort nonlabored Abd: well healed scars from previous surgeries, soft, NT/ND, +BS, no masses, hernias, or organomegaly MS: no BUE/BLE edema, calves soft and nontender Skin: warm and dry with no masses, lesions, or rashes Psych: A&Ox4 with an appropriate affect Neuro: MAEs, no gross motor or sensory deficits BUE/BLE  Results for orders placed or performed during the hospital encounter of 11/05/21 (from the past 48 hour(s))  Urinalysis, Routine w reflex microscopic Urine, Clean Catch     Status: None   Collection Time: 11/05/21  8:05 AM  Result Value Ref Range   Color, Urine YELLOW YELLOW   APPearance CLEAR CLEAR   Specific Gravity, Urine >=1.030 1.005 - 1.030   pH  6.0 5.0 - 8.0   Glucose, UA NEGATIVE NEGATIVE mg/dL   Hgb urine dipstick NEGATIVE NEGATIVE   Bilirubin Urine NEGATIVE NEGATIVE   Ketones, ur NEGATIVE NEGATIVE mg/dL   Protein, ur NEGATIVE NEGATIVE mg/dL   Nitrite NEGATIVE NEGATIVE   Leukocytes,Ua NEGATIVE NEGATIVE    Comment: Microscopic not done on urines with negative protein, blood, leukocytes, nitrite, or glucose < 500 mg/dL. Performed at [facility], [address] [place], [place] [address]   Comprehensive metabolic panel     Status: Abnormal   Collection Time: 11/05/21  8:43 AM  Result Value Ref Range   Sodium 138 135 - 145 mmol/L   Potassium 4.0 3.5 - 5.1 mmol/L   Chloride 102 98 - 111 mmol/L   CO2 26 22 - 32 mmol/L   Glucose, Bld 169 (H) 70 - 99 mg/dL    Comment: Glucose reference range applies only to samples taken after fasting for at least 8 hours.   BUN 33 (H) 8 - 23 mg/dL   Creatinine, Ser 0.95 0.44 - 1.00 mg/dL   Calcium 9.5 8.9 - 10.3 mg/dL   Total Protein 7.5 6.5 - 8.1 g/dL   Albumin 4.5 3.5 - 5.0 g/dL   AST 42 (H) 15 - 41 U/L   ALT 27 0 - 44 U/L   Alkaline Phosphatase 69 38 - 126 U/L   Total Bilirubin 1.6 (H) 0.3 - 1.2 mg/dL   GFR, Estimated >60 >60 mL/min    Comment: (NOTE) Calculated using the CKD-EPI Creatinine Equation (2021)  Anion gap 10 5 - 15    Comment: Performed at [facility], [address] [place], [place] [address]  Lipase, blood     Status: None   Collection Time: 11/05/21  8:43 AM  Result Value Ref Range   Lipase 31 11 - 51 U/L    Comment: Performed at [facility], [address] [place], [place] [address]  SARS Coronavirus 2 by RT PCR (hospital order, performed in [facility] hospital lab) *cepheid single result test* Anterior Nasal Swab     Status: None   Collection Time: 11/05/21  8:43 AM   Specimen: Anterior Nasal Swab  Result Value Ref Range   SARS Coronavirus 2 by RT PCR NEGATIVE NEGATIVE    Comment: (NOTE) SARS-CoV-2 target nucleic acids  are NOT DETECTED.  The SARS-CoV-2 RNA is generally detectable in upper and lower respiratory specimens during the acute phase of infection. The lowest concentration of SARS-CoV-2 viral copies this assay can detect is 250 copies / mL. A negative result does not preclude SARS-CoV-2 infection and should not be used as the sole basis for treatment or other patient management decisions.  A negative result may occur with improper specimen collection / handling, submission of specimen other than nasopharyngeal swab, presence of viral mutation(s) within the areas targeted by this assay, and inadequate number of viral copies (<250 copies / mL). A negative result must be combined with clinical observations, patient history, and epidemiological information.  Fact Sheet for Patients:   https://www.patel.info/  Fact Sheet for Healthcare Providers: https://hall.com/  This test is not yet approved or  cleared by the [place] FDA and has been authorized for detection and/or diagnosis of SARS-CoV-2 by FDA under an Emergency Use Authorization (EUA).  This EUA will remain in effect (meaning this test can be used) for the duration of the COVID-19 declaration under Section 564(b)(1) of the Act, 21 U.S.C. section 360bbb-3(b)(1), unless the authorization is terminated or revoked sooner.  Performed at [facility], [address] [place], [place] [address]   CBC with Differential/Platelet     Status: Abnormal   Collection Time: 11/05/21  9:20 AM  Result Value Ref Range   WBC 12.3 (H) 4.0 - 10.5 K/uL   RBC 4.08 3.87 - 5.11 MIL/uL   Hemoglobin 13.4 12.0 - 15.0 g/dL   HCT 37.6 36.0 - 46.0 %   MCV 92.2 80.0 - 100.0 fL   MCH 32.8 26.0 - 34.0 pg   MCHC 35.6 30.0 - 36.0 g/dL   RDW 12.3 11.5 - 15.5 %   Platelets 199 150 - 400 K/uL   nRBC 0.0 0.0 - 0.2 %   Neutrophils Relative % 83 %   Neutro Abs 10.3 (H) 1.7 - 7.7 K/uL   Lymphocytes Relative 8  %   Lymphs Abs 0.9 0.7 - 4.0 K/uL   Monocytes Relative 6 %   Monocytes Absolute 0.7 0.1 - 1.0 K/uL   Eosinophils Relative 2 %   Eosinophils Absolute 0.3 0.0 - 0.5 K/uL   Basophils Relative 1 %   Basophils Absolute 0.1 0.0 - 0.1 K/uL   Immature Granulocytes 0 %   Abs Immature Granulocytes 0.03 0.00 - 0.07 K/uL    Comment: Performed at [facility], [address] [place], [place] [address]   CT CHEST ABDOMEN PELVIS WO CONTRAST  Result Date: 11/05/2021 CLINICAL DATA:  Chest pain or back pain, aortic dissection suspected Aortic aneurysm, known or  suspected chest pain, abdominal pain EXAM: CT CHEST, ABDOMEN AND PELVIS WITHOUT CONTRAST TECHNIQUE: Multidetector CT imaging of the chest, abdomen and pelvis was performed following the standard protocol without IV contrast. RADIATION DOSE REDUCTION: This exam was performed according to the departmental dose-optimization program which includes automated exposure control, adjustment of the mA and/or kV according to patient size and/or use of iterative reconstruction technique. COMPARISON:  Jun 25, 2021 FINDINGS: Evaluation is limited by lack of IV contrast. IV infiltrated during examination. CT CHEST FINDINGS Cardiovascular: Revisualization of aneurysmal dilation of the ascending thoracic aorta to 4.1 x 4.1 cm, similar comparison to prior. Relative preservation of the sino-tubular junction. Atherosclerotic calcifications of the aorta. Evaluation for dissection is limited by lack of IV contrast. No evidence of intramural hematoma. No pericardial effusion. Mediastinum/Nodes: Unchanged appearance of the thyroid. No new axillary mediastinal adenopathy. Similar appearance of a precarinal lymph node measuring 10 mm in the short axis. Lungs/Pleura: No pleural effusion or pneumothorax. Unchanged 4 mm LEFT lower lobe pulmonary nodule for greater than 2 years, consistent with a benign etiology (series 8, image 30). Lingular atelectasis. Musculoskeletal:  Degenerative changes of the thoracic spine. CT ABDOMEN PELVIS FINDINGS Hepatobiliary: Revisualization of scattered hepatic cysts. Unremarkable noncontrast appearance of the gallbladder. Pancreas: No peripancreatic fat stranding. Spleen: Unremarkable. Adrenals/Urinary Tract: Adrenal glands are unremarkable. No hydronephrosis. Excreted contrast within the renal collecting systems. Bladder is unremarkable. Stomach/Bowel: There is a small bowel obstruction within the lower abdomen. This is similar in comparison prior. Possible transition point is in the LEFT lower quadrant (series 6, image 63 (series 9, image 98). There is mild adjacent fat stranding of the distended bowel loops. Ileum is decompressed. Evaluation for ischemia is limited by lack of IV contrast. Small hiatal hernia. Moderate colonic stool burden diffusely throughout the colon. Appendix is normal. Vascular/Lymphatic: Atherosclerotic calcifications of the nonaneurysmal abdominal aorta. No new suspicious lymphadenopathy. Reproductive: Uterus and bilateral adnexa are unremarkable. Other: No free air. Musculoskeletal: Grade 1 anterolisthesis of L4-5. IMPRESSION: 1. Small bowel obstruction. Question transition point in the LEFT lower quadrant. There is fat stranding adjacent to the bowel loops without evidence of pneumatosis or definitive evidence of bowel ischemia at this point in time. Recommend correlation with lactate levels. 2. Similar appearance of a mild ascending thoracic aortic aneurysm to 4.1 cm. Recommend annual imaging followup by CTA or MRA. This recommendation follows 2010 ACCF/AHA/AATS/ACR/ASA/SCA/SCAI/SIR/STS/SVM Guidelines for the Diagnosis and Management of Patients with Thoracic Aortic Disease. Circulation. 2010; 121: S063-K160. Aortic aneurysm NOS (ICD10-I71.9) Electronically Signed   By: Valentino Saxon M.D.   On: 11/05/2021 12:13      Assessment/Plan Recurrent SBO - Patient with complex surgical history listed above, and several  postoperative bowel obstructions. This is her third one this year requiring admission. They typically resolve quickly, but are becoming more frequent. She did not follow up in the office after last admission and planned, but is interested in doing so this time to further discuss risks/benefits of considering diagnostic laparoscopy. If she has surgery she would like to do so at a time when her son(s) could be available to help. Will advance diet today. If tolerating and continues to have bowel function she would be ok for discharge later today from our standpoint.  ID - none VTE - SCDs, ok for chemical dvt ppx from surgical standpoint FEN - IVF, CLD Foley - none  AAA GERD HTN HLD History of TIA Hx DVT  I reviewed hospitalist notes, last 24 h vitals and pain scores, last  48 h intake and output, last 24 h labs and trends, and last 24 h imaging results   Wellington Hampshire, [facility] 11/05/2021, 2:15 PM Please see Amion for pager number during day hours 7:00am-4:30pm

## 2021-11-05 NOTE — Progress Notes (Signed)
Plan of Care Note for accepted transfer   Patient: Cynthia Peters MRN: [id number]   DOA: 11/05/2021  Facility requesting transfer: [facility] Requesting Provider: Dr. Ronnald Nian Reason for transfer: SBO Facility course: 71 yo F w/ PMHx of DVT, TIA, DM2, asthma. Presenting with abdominal pain and nausea. W/u revealed SBO. EDP spoke with general surgery. They will follow upon arrival to hospital but recommended medicine admit. She is not currently vomiting, so no NGT placed.    Plan of care: The patient is accepted for admission to Telemetry unit, at [facility]..  While holding at [facility], medical decision making responsibilities remain with the EDP. Upon arrival to [facility], [facility] will assume care. Thank you.   Author: Jonnie Finner, DO 11/05/2021  Check [email or web address] for on-call coverage.  Nursing staff, Please call [place] number on Amion as soon as patient's arrival, so appropriate admitting provider can evaluate the pt.

## 2021-11-05 NOTE — Assessment & Plan Note (Signed)
Stable. Continue HTN meds.

## 2021-11-05 NOTE — ED Notes (Signed)
Pt ambulatory to [facility]

## 2021-11-06 ENCOUNTER — Observation Stay (HOSPITAL_COMMUNITY): Payer: Medicare PPO

## 2021-11-06 DIAGNOSIS — K56609 Unspecified intestinal obstruction, unspecified as to partial versus complete obstruction: Secondary | ICD-10-CM | POA: Diagnosis not present

## 2021-11-06 LAB — CBC WITH DIFFERENTIAL/PLATELET
Abs Immature Granulocytes: 0.02 10*3/uL (ref 0.00–0.07)
Basophils Absolute: 0.1 10*3/uL (ref 0.0–0.1)
Basophils Relative: 1 %
Eosinophils Absolute: 0.4 10*3/uL (ref 0.0–0.5)
Eosinophils Relative: 5 %
HCT: 43.6 % (ref 36.0–46.0)
Hemoglobin: 13.7 g/dL (ref 12.0–15.0)
Immature Granulocytes: 0 %
Lymphocytes Relative: 27 %
Lymphs Abs: 2.1 10*3/uL (ref 0.7–4.0)
MCH: 32.9 pg (ref 26.0–34.0)
MCHC: 31.4 g/dL (ref 30.0–36.0)
MCV: 104.8 fL — ABNORMAL HIGH (ref 80.0–100.0)
Monocytes Absolute: 1 10*3/uL (ref 0.1–1.0)
Monocytes Relative: 12 %
Neutro Abs: 4.2 10*3/uL (ref 1.7–7.7)
Neutrophils Relative %: 55 %
Platelets: 220 10*3/uL (ref 150–400)
RBC: 4.16 MIL/uL (ref 3.87–5.11)
RDW: 12.7 % (ref 11.5–15.5)
WBC: 7.7 10*3/uL (ref 4.0–10.5)
nRBC: 0 % (ref 0.0–0.2)

## 2021-11-06 LAB — COMPREHENSIVE METABOLIC PANEL
ALT: 21 U/L (ref 0–44)
AST: 20 U/L (ref 15–41)
Albumin: 3.9 g/dL (ref 3.5–5.0)
Alkaline Phosphatase: 56 U/L (ref 38–126)
Anion gap: 8 (ref 5–15)
BUN: 21 mg/dL (ref 8–23)
CO2: 24 mmol/L (ref 22–32)
Calcium: 9.3 mg/dL (ref 8.9–10.3)
Chloride: 107 mmol/L (ref 98–111)
Creatinine, Ser: 0.86 mg/dL (ref 0.44–1.00)
GFR, Estimated: >60 mL/min (ref >60)
Glucose, Bld: 117 mg/dL — ABNORMAL HIGH (ref 70–99)
Potassium: 3.5 mmol/L (ref 3.5–5.1)
Sodium: 139 mmol/L (ref 135–145)
Total Bilirubin: 1.4 mg/dL — ABNORMAL HIGH (ref 0.3–1.2)
Total Protein: 6.4 g/dL — ABNORMAL LOW (ref 6.5–8.1)

## 2021-11-06 MED ORDER — DIPHENHYDRAMINE HCL 25 MG PO CAPS
25.0000 mg | ORAL_CAPSULE | Freq: Four times a day (QID) | ORAL | Status: DC | PRN
  Administered 2021-11-06: 25 mg via ORAL
  Filled 2021-11-06: qty 1

## 2021-11-06 MED ORDER — PRAVASTATIN SODIUM 40 MG PO TABS
40.0000 mg | ORAL_TABLET | Freq: Every day | ORAL | Status: DC
  Administered 2021-11-06: 40 mg via ORAL
  Filled 2021-11-06: qty 1

## 2021-11-06 MED ORDER — LACTATED RINGERS IV SOLN: INTRAVENOUS | Status: DC

## 2021-11-06 MED ORDER — PANTOPRAZOLE SODIUM 40 MG PO TBEC
40.0000 mg | DELAYED_RELEASE_TABLET | Freq: Two times a day (BID) | ORAL | Status: DC
  Administered 2021-11-06 – 2021-11-07 (×2): 40 mg via ORAL
  Filled 2021-11-06 (×2): qty 1

## 2021-11-06 MED ORDER — SODIUM CHLORIDE 0.9 % IV SOLN: INTRAVENOUS | Status: DC

## 2021-11-06 MED ORDER — OXYCODONE HCL 5 MG PO TABS: 5.0000 mg | ORAL_TABLET | ORAL | Status: DC | PRN

## 2021-11-06 MED ORDER — HYDROMORPHONE HCL 1 MG/ML IJ SOLN: 0.5000 mg | INTRAMUSCULAR | Status: DC | PRN

## 2021-11-06 MED ORDER — AMLODIPINE BESYLATE 5 MG PO TABS
5.0000 mg | ORAL_TABLET | Freq: Every day | ORAL | Status: DC
  Administered 2021-11-06 – 2021-11-07 (×2): 5 mg via ORAL
  Filled 2021-11-06 (×2): qty 1

## 2021-11-06 MED ORDER — ACETAMINOPHEN 650 MG RE SUPP: 650.0000 mg | Freq: Four times a day (QID) | RECTAL | Status: DC | PRN

## 2021-11-06 MED ORDER — ORAL CARE MOUTH RINSE: 15.0000 mL | OROMUCOSAL | Status: DC | PRN

## 2021-11-06 MED ORDER — METOPROLOL SUCCINATE ER 100 MG PO TB24
100.0000 mg | ORAL_TABLET | Freq: Two times a day (BID) | ORAL | Status: DC
  Administered 2021-11-06 (×2): 100 mg via ORAL
  Filled 2021-11-06 (×3): qty 1

## 2021-11-06 MED ORDER — ONDANSETRON HCL 4 MG PO TABS: 4.0000 mg | ORAL_TABLET | Freq: Four times a day (QID) | ORAL | Status: DC | PRN

## 2021-11-06 MED ORDER — ONDANSETRON HCL 4 MG/2ML IJ SOLN: 4.0000 mg | Freq: Four times a day (QID) | INTRAMUSCULAR | Status: DC | PRN

## 2021-11-06 MED ORDER — ACETAMINOPHEN 325 MG PO TABS
650.0000 mg | ORAL_TABLET | Freq: Four times a day (QID) | ORAL | Status: DC | PRN
  Administered 2021-11-06: 650 mg via ORAL
  Filled 2021-11-06: qty 2

## 2021-11-06 MED ORDER — LOSARTAN POTASSIUM 50 MG PO TABS
100.0000 mg | ORAL_TABLET | Freq: Every day | ORAL | Status: DC
  Administered 2021-11-06 – 2021-11-07 (×2): 100 mg via ORAL
  Filled 2021-11-06 (×2): qty 2

## 2021-11-06 MED ORDER — HYDROCHLOROTHIAZIDE 25 MG PO TABS
25.0000 mg | ORAL_TABLET | Freq: Every day | ORAL | Status: DC
  Administered 2021-11-06 – 2021-11-07 (×2): 25 mg via ORAL
  Filled 2021-11-06 (×2): qty 1

## 2021-11-06 NOTE — Progress Notes (Signed)
Pt c/o itching and increased redness to left AC area. States "the contrast infiltrated yesterday". Dr. Sloan Leiter made aware. New order for PO Benadryl and administered to patient.

## 2021-11-06 NOTE — Plan of Care (Signed)

## 2021-11-06 NOTE — Progress Notes (Signed)
PROGRESS NOTE    LACOSTA HARGAN  JIR:[id number] DOB: 1950/11/23 DOA: 11/05/2021 PCP: Aletha Halim., PA-C    Brief Narrative:  History of hypertension, TIAs, recurrent small bowel obstruction admitted with nausea, abdominal pain and distention for 1 day.  In the emergency room hemodynamically stable.  CT scan with small bowel obstruction with transition at left lower quadrant.  Admitted with surgery consultation.   Assessment & Plan:   Partial small bowel obstruction: Clinical improvement today with a conservative management.  Followed by surgery. Continue IV fluids, challenging with full liquid diet, mobilize and monitor.   Check and replace electrolytes.  Essential hypertension: Blood pressure stable.  Resume hydrochlorothiazide.  Mobilize.  Challenging with liquid diet.   DVT prophylaxis: SCDs Start: 11/06/21 0805   Code Status: Full code Family Communication: None at the bedside Disposition Plan: Status is: Observation The patient remains OBS appropriate and will d/c before 2 midnights.     Consultants:  General surgery  Procedures:  None  Antimicrobials:  None   Subjective: Patient seen and examined on morning rounds.  Mild discomfort remains but denies any nausea vomiting abdominal distention.  Not passing flatus.  No bowel movement but she has gurgling. Nervous about oral intake.  Objective: Vitals:   11/06/21 0225 11/06/21 0632 11/06/21 1021 11/06/21 1259  BP: 122/62 (!) 128/54 (!) 127/92 (!) 123/47  Pulse: 68 63 66 (!) 57  Resp:  '20 17 17  '$ Temp: 98 F (36.7 C) 98 F (36.7 C) 98.6 F (37 C) (!) 97.3 F (36.3 C)  TempSrc: Oral Oral Oral Oral  SpO2: 100% 99% 98% 99%  Weight:      Height:        Intake/Output Summary (Last 24 hours) at 11/06/2021 1314 Last data filed at 11/06/2021 1000 Gross per 24 hour  Intake 263.65 ml  Output --  Net 263.65 ml   Filed Weights   11/05/21 0800  Weight: 68 kg    Examination:  General exam:  Appears calm and comfortable at rest. Respiratory system: Clear to auscultation. Respiratory effort normal. Cardiovascular system: S1 & S2 heard, RRR. No JVD, murmurs, rubs, gallops or clicks. No pedal edema. Gastrointestinal system: Nontender.  Previous scars are intact and healthy.  Bowel sounds present. Central nervous system: Alert and oriented. No focal neurological deficits. Extremities: Symmetric 5 x 5 power. Skin: No rashes, lesions or ulcers Psychiatry: Judgement and insight appear normal. Mood & affect appropriate.     Data Reviewed: I have personally reviewed following labs and imaging studies  CBC: Recent Labs  Lab 11/05/21 0920 11/06/21 0928  WBC 12.3* 7.7  NEUTROABS 10.3* 4.2  HGB 13.4 13.7  HCT 37.6 43.6  MCV 92.2 104.8*  PLT 199 751   Basic Metabolic Panel: Recent Labs  Lab 11/05/21 0843 11/06/21 0928  NA 138 139  K 4.0 3.5  CL 102 107  CO2 26 24  GLUCOSE 169* 117*  BUN 33* 21  CREATININE 0.95 0.86  CALCIUM 9.5 9.3   GFR: Estimated Creatinine Clearance: 57.9 mL/min (by C-G formula based on SCr of 0.86 mg/dL). Liver Function Tests: Recent Labs  Lab 11/05/21 0843 11/06/21 0928  AST 42* 20  ALT 27 21  ALKPHOS 69 56  BILITOT 1.6* 1.4*  PROT 7.5 6.4*  ALBUMIN 4.5 3.9   Recent Labs  Lab 11/05/21 0843  LIPASE 31   No results for input(s): "AMMONIA" in the last 168 hours. Coagulation Profile: No results for input(s): "INR", "PROTIME" in the last 168  hours. Cardiac Enzymes: No results for input(s): "CKTOTAL", "CKMB", "CKMBINDEX", "TROPONINI" in the last 168 hours. BNP (last 3 results) No results for input(s): "PROBNP" in the last 8760 hours. HbA1C: No results for input(s): "HGBA1C" in the last 72 hours. CBG: No results for input(s): "GLUCAP" in the last 168 hours. Lipid Profile: No results for input(s): "CHOL", "HDL", "LDLCALC", "TRIG", "CHOLHDL", "LDLDIRECT" in the last 72 hours. Thyroid Function Tests: No results for input(s): "TSH",  "T4TOTAL", "FREET4", "T3FREE", "THYROIDAB" in the last 72 hours. Anemia Panel: No results for input(s): "VITAMINB12", "FOLATE", "FERRITIN", "TIBC", "IRON", "RETICCTPCT" in the last 72 hours. Sepsis Labs: No results for input(s): "PROCALCITON", "LATICACIDVEN" in the last 168 hours.  Recent Results (from the past 240 hour(s))  SARS Coronavirus 2 by RT PCR (hospital order, performed in [facility] hospital lab) *cepheid single result test* Anterior Nasal Swab     Status: None   Collection Time: 11/05/21  8:43 AM   Specimen: Anterior Nasal Swab  Result Value Ref Range Status   SARS Coronavirus 2 by RT PCR NEGATIVE NEGATIVE Final    Comment: (NOTE) SARS-CoV-2 target nucleic acids are NOT DETECTED.  The SARS-CoV-2 RNA is generally detectable in upper and lower respiratory specimens during the acute phase of infection. The lowest concentration of SARS-CoV-2 viral copies this assay can detect is 250 copies / mL. A negative result does not preclude SARS-CoV-2 infection and should not be used as the sole basis for treatment or other patient management decisions.  A negative result may occur with improper specimen collection / handling, submission of specimen other than nasopharyngeal swab, presence of viral mutation(s) within the areas targeted by this assay, and inadequate number of viral copies (<250 copies / mL). A negative result must be combined with clinical observations, patient history, and epidemiological information.  Fact Sheet for Patients:   https://www.patel.info/  Fact Sheet for Healthcare Providers: https://hall.com/  This test is not yet approved or  cleared by the [place] FDA and has been authorized for detection and/or diagnosis of SARS-CoV-2 by FDA under an Emergency Use Authorization (EUA).  This EUA will remain in effect (meaning this test can be used) for the duration of the COVID-19 declaration under Section 564(b)(1)  of the Act, 21 U.S.C. section 360bbb-3(b)(1), unless the authorization is terminated or revoked sooner.  Performed at [facility], [address] [place], [place] [address]          Radiology Studies: DG Abd 1 View  Result Date: 11/06/2021 CLINICAL DATA:  Small-bowel obstruction. EXAM: ABDOMEN - 1 VIEW COMPARISON:  CT scan from yesterday. FINDINGS: Scattered air and stool throughout the colon and down into the rectum. No air distended small bowel loops to suggest obstruction. No free air. The soft tissue shadows are maintained. No worrisome calcifications. The bony structures unremarkable. IMPRESSION: Nonobstructive bowel gas pattern. Electronically Signed   By: Marijo Sanes M.D.   On: 11/06/2021 09:25   CT CHEST ABDOMEN PELVIS WO CONTRAST  Result Date: 11/05/2021 CLINICAL DATA:  Chest pain or back pain, aortic dissection suspected Aortic aneurysm, known or suspected chest pain, abdominal pain EXAM: CT CHEST, ABDOMEN AND PELVIS WITHOUT CONTRAST TECHNIQUE: Multidetector CT imaging of the chest, abdomen and pelvis was performed following the standard protocol without IV contrast. RADIATION DOSE REDUCTION: This exam was performed according to the departmental dose-optimization program which includes automated exposure control, adjustment of the mA and/or kV according to patient size and/or use of iterative reconstruction technique. COMPARISON:  Jun 25, 2021 FINDINGS: Evaluation is limited by lack of IV contrast. IV infiltrated during examination. CT CHEST FINDINGS Cardiovascular: Revisualization of aneurysmal dilation of the ascending thoracic aorta to 4.1 x 4.1 cm, similar comparison to prior. Relative preservation of the sino-tubular junction. Atherosclerotic calcifications of the aorta. Evaluation for dissection is limited by lack of IV contrast. No evidence of intramural hematoma. No pericardial effusion. Mediastinum/Nodes: Unchanged appearance of the thyroid. No new axillary  mediastinal adenopathy. Similar appearance of a precarinal lymph node measuring 10 mm in the short axis. Lungs/Pleura: No pleural effusion or pneumothorax. Unchanged 4 mm LEFT lower lobe pulmonary nodule for greater than 2 years, consistent with a benign etiology (series 8, image 30). Lingular atelectasis. Musculoskeletal: Degenerative changes of the thoracic spine. CT ABDOMEN PELVIS FINDINGS Hepatobiliary: Revisualization of scattered hepatic cysts. Unremarkable noncontrast appearance of the gallbladder. Pancreas: No peripancreatic fat stranding. Spleen: Unremarkable. Adrenals/Urinary Tract: Adrenal glands are unremarkable. No hydronephrosis. Excreted contrast within the renal collecting systems. Bladder is unremarkable. Stomach/Bowel: There is a small bowel obstruction within the lower abdomen. This is similar in comparison prior. Possible transition point is in the LEFT lower quadrant (series 6, image 63 (series 9, image 98). There is mild adjacent fat stranding of the distended bowel loops. Ileum is decompressed. Evaluation for ischemia is limited by lack of IV contrast. Small hiatal hernia. Moderate colonic stool burden diffusely throughout the colon. Appendix is normal. Vascular/Lymphatic: Atherosclerotic calcifications of the nonaneurysmal abdominal aorta. No new suspicious lymphadenopathy. Reproductive: Uterus and bilateral adnexa are unremarkable. Other: No free air. Musculoskeletal: Grade 1 anterolisthesis of L4-5. IMPRESSION: 1. Small bowel obstruction. Question transition point in the LEFT lower quadrant. There is fat stranding adjacent to the bowel loops without evidence of pneumatosis or definitive evidence of bowel ischemia at this point in time. Recommend correlation with lactate levels. 2. Similar appearance of a mild ascending thoracic aortic aneurysm to 4.1 cm. Recommend annual imaging followup by CTA or MRA. This recommendation follows 2010 ACCF/AHA/AATS/ACR/ASA/SCA/SCAI/SIR/STS/SVM Guidelines  for the Diagnosis and Management of Patients with Thoracic Aortic Disease. Circulation. 2010; 121: J242-A834. Aortic aneurysm NOS (ICD10-I71.9) Electronically Signed   By: Valentino Saxon M.D.   On: 11/05/2021 12:13        Scheduled Meds:  amLODipine  5 mg Oral Daily   hydrochlorothiazide  25 mg Oral Daily   losartan  100 mg Oral Daily   metoprolol succinate  100 mg Oral BID   pantoprazole  40 mg Oral BID   pravastatin  40 mg Oral q1800   Continuous Infusions:  sodium chloride 100 mL/hr at 11/06/21 0942     LOS: 0 days    Time spent: 25 minutes    Barb Merino, MD [facility] Pager [phone]

## 2021-11-06 NOTE — Progress Notes (Signed)
Mobility Specialist - Progress Note   11/06/21 1306  Mobility  Activity Refused mobility   Pt refused mobility, claimed IND.   Roderick Pee Mobility Specialist

## 2021-11-07 DIAGNOSIS — K56609 Unspecified intestinal obstruction, unspecified as to partial versus complete obstruction: Secondary | ICD-10-CM | POA: Diagnosis not present

## 2021-11-07 MED ORDER — DIPHENHYDRAMINE HCL 25 MG PO CAPS: 25.0000 mg | ORAL_CAPSULE | Freq: Four times a day (QID) | ORAL | 0 refills | Status: AC | PRN

## 2021-11-07 NOTE — Discharge Summary (Signed)
Physician Discharge Summary  Cynthia Peters JKK:[id number] DOB: 09-02-1950 DOA: 11/05/2021  PCP: Aletha Halim., PA-C  Admit date: 11/05/2021 Discharge date: 11/07/2021  Admitted From: Home Disposition: Home  Recommendations for Outpatient Follow-up:  Follow up with PCP in 1-2 weeks Surgery office will schedule follow-up  Home Health: N/A Equipment/Devices: N/A  Discharge Condition: Stable CODE STATUS: Full code Diet recommendation: Low-salt diet, liquid and soft food.  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  In the emergency room she was hemodynamically stable.  A CT scan was done that showed small bowel obstruction with transition at left lower quadrant.  She was admitted with surgical consultation.  Initially treated with n.p.o., IV fluids.  Patient had rapid clinical improvement and now with normal bowel function.  She is tolerating regular diet and passing bowel movements.  Electrolytes are adequate.  Medically stable.  Discharging home.  She will follow-up with surgery as outpatient.  Resume all outpatient home medications including antihypertensives.   Discharge Diagnoses:  Principal Problem:   SBO (small bowel obstruction) (HCC) - recurrent Active Problems:   Hypertension   Hyperlipidemia   Thoracic aortic aneurysm [facility])    Discharge Instructions  Discharge Instructions     Call MD for:  persistant nausea and vomiting   Complete by: As directed    Call MD for:  severe uncontrolled pain   Complete by: As directed    Diet - low sodium heart healthy   Complete by: As directed    Stay on soft and liquid diet   Increase activity slowly   Complete by: As directed    Increase activity slowly   Complete by: As directed       Allergies as of 11/07/2021       Reactions   Hydrocodone Rash   Hydrocodone-acetaminophen Rash    Tramadol Hives, Palpitations        Medication List     TAKE these medications    acetaminophen 500 MG tablet Commonly known as: TYLENOL Take 1,000 mg by mouth every 6 (six) hours as needed (pain).   albuterol 108 (90 Base) MCG/ACT inhaler Commonly known as: VENTOLIN HFA Inhale 2 puffs into the lungs every 6 (six) hours as needed for wheezing or shortness of breath.   ALIGN PO Take 1 capsule by mouth 2 (two) times daily.   amLODipine 5 MG tablet Commonly known as: NORVASC Take 5 mg by mouth daily.   aspirin 81 MG chewable tablet Chew 81 mg by mouth daily.   buPROPion 300 MG 24 hr tablet Commonly known as: WELLBUTRIN XL Take 300 mg by mouth daily.   clobetasol cream 0.05 % Commonly known as: TEMOVATE Apply 1 application topically 2 (two) times daily as needed (rash).   Co Q 10 100 MG Caps Take 100 mg by mouth daily.   [employer] Vitamin C 1000 MG tablet Generic drug: ascorbic acid Take 2,000 mg by mouth 2 (two) times daily.   diphenhydrAMINE 25 mg capsule Commonly known as: BENADRYL Take 1 capsule (25 mg total) by mouth every 6 (six) hours as needed for itching or allergies.   fexofenadine 180 MG tablet Commonly known as: ALLEGRA Take 180 mg by mouth daily.   Flaxseed (Linseed) 1000 MG Caps Take 1,000 mg by mouth daily.   fluticasone 50 MCG/ACT nasal spray Commonly known as: FLONASE Place 2 sprays into both nostrils daily as needed for  allergies or rhinitis.   hydrochlorothiazide 25 MG tablet Commonly known as: HYDRODIURIL Take 25 mg by mouth daily.   hydrocortisone cream 1 % Apply 1 application topically 4 (four) times daily as needed for itching.   ibuprofen 200 MG tablet Commonly known as: ADVIL Take 200-400 mg by mouth every 6 (six) hours as needed for mild pain.   losartan 100 MG tablet Commonly known as: COZAAR TAKE 1 TABLET BY MOUTH EVERY DAY   magnesium oxide 400 MG tablet Commonly known as: MAG-OX Take 400 mg by mouth daily.   meclizine  25 MG tablet Commonly known as: ANTIVERT Take 1 tablet (25 mg total) by mouth 3 (three) times daily as needed for dizziness.   metoprolol succinate 100 MG 24 hr tablet Commonly known as: TOPROL-XL Take 1 tablet (100 mg total) by mouth 2 (two) times daily. Take with or immediately following a meal. Take 100 mg by mouth 2 (two) times daily. Take with or immediately following a meal. What changed: additional instructions   montelukast 10 MG tablet Commonly known as: SINGULAIR Take 10 mg by mouth at bedtime.   MULTIPLE VITAMINS/WOMENS PO Take 1 tablet by mouth daily.   mupirocin ointment 2 % Commonly known as: BACTROBAN Apply 1 application topically 3 (three) times daily as needed for rash.   pantoprazole 20 MG tablet Commonly known as: PROTONIX Take 20 mg by mouth daily.   polyethylene glycol 17 g packet Commonly known as: MIRALAX / GLYCOLAX Take 17 g by mouth 2 (two) times daily. What changed: when to take this   potassium chloride SA 20 MEQ tablet Commonly known as: Klor-Con M20 TAKE 1&1/2 TABLETS BY MOUTH DAILY. What changed:  how much to take how to take this when to take this additional instructions   pravastatin 40 MG tablet Commonly known as: PRAVACHOL Take 40 mg by mouth daily.   Systane Ultra PF 0.4-0.3 % Soln Generic drug: Polyethyl Glyc-Propyl Glyc PF Place 1 drop into both eyes 4 (four) times daily as needed (Dry eye).   valACYclovir 500 MG tablet Commonly known as: VALTREX INCREASE TO TAKING 1 TABLET 2 TIMES A DAY FOR 3 DAYS WITH OUTBREAK What changed: See the new instructions.   Vitamin D 50 MCG (2000 UT) Caps Take 2,000 Units by mouth daily.        Follow-up Information     Ralene Ok, MD. Call.   Specialty: General Surgery Why: Call for appointment to discuss surgery Contact information: [address] [place] [address] [phone]                Allergies  Allergen Reactions   Hydrocodone Rash    Hydrocodone-Acetaminophen Rash   Tramadol Hives and Palpitations    Consultations: General surgery   Procedures/Studies: DG Abd 1 View  Result Date: 11/06/2021 CLINICAL DATA:  Small-bowel obstruction. EXAM: ABDOMEN - 1 VIEW COMPARISON:  CT scan from yesterday. FINDINGS: Scattered air and stool throughout the colon and down into the rectum. No air distended small bowel loops to suggest obstruction. No free air. The soft tissue shadows are maintained. No worrisome calcifications. The bony structures unremarkable. IMPRESSION: Nonobstructive bowel gas pattern. Electronically Signed   By: Marijo Sanes M.D.   On: 11/06/2021 09:25   CT CHEST ABDOMEN PELVIS WO CONTRAST  Result Date: 11/05/2021 CLINICAL DATA:  Chest pain or back pain, aortic dissection suspected Aortic aneurysm, known or suspected chest pain, abdominal pain EXAM: CT CHEST, ABDOMEN AND PELVIS WITHOUT CONTRAST TECHNIQUE: Multidetector  CT imaging of the chest, abdomen and pelvis was performed following the standard protocol without IV contrast. RADIATION DOSE REDUCTION: This exam was performed according to the departmental dose-optimization program which includes automated exposure control, adjustment of the mA and/or kV according to patient size and/or use of iterative reconstruction technique. COMPARISON:  Jun 25, 2021 FINDINGS: Evaluation is limited by lack of IV contrast. IV infiltrated during examination. CT CHEST FINDINGS Cardiovascular: Revisualization of aneurysmal dilation of the ascending thoracic aorta to 4.1 x 4.1 cm, similar comparison to prior. Relative preservation of the sino-tubular junction. Atherosclerotic calcifications of the aorta. Evaluation for dissection is limited by lack of IV contrast. No evidence of intramural hematoma. No pericardial effusion. Mediastinum/Nodes: Unchanged appearance of the thyroid. No new axillary mediastinal adenopathy. Similar appearance of a precarinal lymph node measuring 10 mm in the short  axis. Lungs/Pleura: No pleural effusion or pneumothorax. Unchanged 4 mm LEFT lower lobe pulmonary nodule for greater than 2 years, consistent with a benign etiology (series 8, image 30). Lingular atelectasis. Musculoskeletal: Degenerative changes of the thoracic spine. CT ABDOMEN PELVIS FINDINGS Hepatobiliary: Revisualization of scattered hepatic cysts. Unremarkable noncontrast appearance of the gallbladder. Pancreas: No peripancreatic fat stranding. Spleen: Unremarkable. Adrenals/Urinary Tract: Adrenal glands are unremarkable. No hydronephrosis. Excreted contrast within the renal collecting systems. Bladder is unremarkable. Stomach/Bowel: There is a small bowel obstruction within the lower abdomen. This is similar in comparison prior. Possible transition point is in the LEFT lower quadrant (series 6, image 63 (series 9, image 98). There is mild adjacent fat stranding of the distended bowel loops. Ileum is decompressed. Evaluation for ischemia is limited by lack of IV contrast. Small hiatal hernia. Moderate colonic stool burden diffusely throughout the colon. Appendix is normal. Vascular/Lymphatic: Atherosclerotic calcifications of the nonaneurysmal abdominal aorta. No new suspicious lymphadenopathy. Reproductive: Uterus and bilateral adnexa are unremarkable. Other: No free air. Musculoskeletal: Grade 1 anterolisthesis of L4-5. IMPRESSION: 1. Small bowel obstruction. Question transition point in the LEFT lower quadrant. There is fat stranding adjacent to the bowel loops without evidence of pneumatosis or definitive evidence of bowel ischemia at this point in time. Recommend correlation with lactate levels. 2. Similar appearance of a mild ascending thoracic aortic aneurysm to 4.1 cm. Recommend annual imaging followup by CTA or MRA. This recommendation follows 2010 ACCF/AHA/AATS/ACR/ASA/SCA/SCAI/SIR/STS/SVM Guidelines for the Diagnosis and Management of Patients with Thoracic Aortic Disease. Circulation. 2010; 121:  W295-A213. Aortic aneurysm NOS (ICD10-I71.9) Electronically Signed   By: Valentino Saxon M.D.   On: 11/05/2021 12:13   (Echo, Carotid, EGD, Colonoscopy, ERCP)    Subjective: Patient seen and examined.  Denies any complaints.  She tolerated full liquid diet and trying some soft diet.  Anxious but happy with improvement.   Discharge Exam: Vitals:   11/06/21 2127 11/07/21 0438  BP: 110/65 131/70  Pulse: 63 (!) 53  Resp:    Temp: 98.4 F (36.9 C) 97.7 F (36.5 C)  SpO2: 99% 97%   Vitals:   11/06/21 1021 11/06/21 1259 11/06/21 2127 11/07/21 0438  BP: (!) 127/92 (!) 123/47 110/65 131/70  Pulse: 66 (!) 57 63 (!) 53  Resp: 17 17    Temp: 98.6 F (37 C) (!) 97.3 F (36.3 C) 98.4 F (36.9 C) 97.7 F (36.5 C)  TempSrc: Oral Oral Oral Tympanic  SpO2: 98% 99% 99% 97%  Weight:      Height:        General: Pt is alert, awake, not in acute distress Cardiovascular: RRR, S1/S2 +, no rubs, no gallops  Respiratory: CTA bilaterally, no wheezing, no rhonchi Abdominal: Soft, NT, ND, bowel sounds + Extremities: no edema, no cyanosis    The results of significant diagnostics from this hospitalization (including imaging, microbiology, ancillary and laboratory) are listed below for reference.     Microbiology: Recent Results (from the past 240 hour(s))  SARS Coronavirus 2 by RT PCR (hospital order, performed in [facility] hospital lab) *cepheid single result test* Anterior Nasal Swab     Status: None   Collection Time: 11/05/21  8:43 AM   Specimen: Anterior Nasal Swab  Result Value Ref Range Status   SARS Coronavirus 2 by RT PCR NEGATIVE NEGATIVE Final    Comment: (NOTE) SARS-CoV-2 target nucleic acids are NOT DETECTED.  The SARS-CoV-2 RNA is generally detectable in upper and lower respiratory specimens during the acute phase of infection. The lowest concentration of SARS-CoV-2 viral copies this assay can detect is 250 copies / mL. A negative result does not preclude SARS-CoV-2  infection and should not be used as the sole basis for treatment or other patient management decisions.  A negative result may occur with improper specimen collection / handling, submission of specimen other than nasopharyngeal swab, presence of viral mutation(s) within the areas targeted by this assay, and inadequate number of viral copies (<250 copies / mL). A negative result must be combined with clinical observations, patient history, and epidemiological information.  Fact Sheet for Patients:   https://www.patel.info/  Fact Sheet for Healthcare Providers: https://hall.com/  This test is not yet approved or  cleared by the [place] FDA and has been authorized for detection and/or diagnosis of SARS-CoV-2 by FDA under an Emergency Use Authorization (EUA).  This EUA will remain in effect (meaning this test can be used) for the duration of the COVID-19 declaration under Section 564(b)(1) of the Act, 21 U.S.C. section 360bbb-3(b)(1), unless the authorization is terminated or revoked sooner.  Performed at [facility], [address] [place], [place] [address]      Labs: BNP (last 3 results) No results for input(s): "BNP" in the last 8760 hours. Basic Metabolic Panel: Recent Labs  Lab 11/05/21 0843 11/06/21 0928  NA 138 139  K 4.0 3.5  CL 102 107  CO2 26 24  GLUCOSE 169* 117*  BUN 33* 21  CREATININE 0.95 0.86  CALCIUM 9.5 9.3   Liver Function Tests: Recent Labs  Lab 11/05/21 0843 11/06/21 0928  AST 42* 20  ALT 27 21  ALKPHOS 69 56  BILITOT 1.6* 1.4*  PROT 7.5 6.4*  ALBUMIN 4.5 3.9   Recent Labs  Lab 11/05/21 0843  LIPASE 31   No results for input(s): "AMMONIA" in the last 168 hours. CBC: Recent Labs  Lab 11/05/21 0920 11/06/21 0928  WBC 12.3* 7.7  NEUTROABS 10.3* 4.2  HGB 13.4 13.7  HCT 37.6 43.6  MCV 92.2 104.8*  PLT 199 220   Cardiac Enzymes: No results for input(s): "CKTOTAL",  "CKMB", "CKMBINDEX", "TROPONINI" in the last 168 hours. BNP: Invalid input(s): "POCBNP" CBG: No results for input(s): "GLUCAP" in the last 168 hours. D-Dimer No results for input(s): "DDIMER" in the last 72 hours. Hgb A1c No results for input(s): "HGBA1C" in the last 72 hours. Lipid Profile No results for input(s): "CHOL", "HDL", "LDLCALC", "TRIG", "CHOLHDL", "LDLDIRECT" in the last 72 hours. Thyroid function studies No results for input(s): "TSH", "T4TOTAL", "T3FREE", "THYROIDAB" in the last 72 hours.  Invalid input(s): "FREET3" Anemia work up No results for input(s): "VITAMINB12", "FOLATE", "FERRITIN", "TIBC", "IRON", "  RETICCTPCT" in the last 72 hours. Urinalysis    Component Value Date/Time   COLORURINE YELLOW 11/05/2021 0805   APPEARANCEUR CLEAR 11/05/2021 0805   LABSPEC >=1.030 11/05/2021 0805   PHURINE 6.0 11/05/2021 0805   GLUCOSEU NEGATIVE 11/05/2021 0805   HGBUR NEGATIVE 11/05/2021 0805   BILIRUBINUR NEGATIVE 11/05/2021 0805   BILIRUBINUR n 10/20/2014 0949   KETONESUR NEGATIVE 11/05/2021 0805   PROTEINUR NEGATIVE 11/05/2021 0805   UROBILINOGEN negative 10/20/2014 0949   UROBILINOGEN 0.2 09/09/2014 0942   NITRITE NEGATIVE 11/05/2021 0805   LEUKOCYTESUR NEGATIVE 11/05/2021 0805   Sepsis Labs Recent Labs  Lab 11/05/21 0920 11/06/21 0928  WBC 12.3* 7.7   Microbiology Recent Results (from the past 240 hour(s))  SARS Coronavirus 2 by RT PCR (hospital order, performed in [facility] hospital lab) *cepheid single result test* Anterior Nasal Swab     Status: None   Collection Time: 11/05/21  8:43 AM   Specimen: Anterior Nasal Swab  Result Value Ref Range Status   SARS Coronavirus 2 by RT PCR NEGATIVE NEGATIVE Final    Comment: (NOTE) SARS-CoV-2 target nucleic acids are NOT DETECTED.  The SARS-CoV-2 RNA is generally detectable in upper and lower respiratory specimens during the acute phase of infection. The lowest concentration of SARS-CoV-2 viral copies this  assay can detect is 250 copies / mL. A negative result does not preclude SARS-CoV-2 infection and should not be used as the sole basis for treatment or other patient management decisions.  A negative result may occur with improper specimen collection / handling, submission of specimen other than nasopharyngeal swab, presence of viral mutation(s) within the areas targeted by this assay, and inadequate number of viral copies (<250 copies / mL). A negative result must be combined with clinical observations, patient history, and epidemiological information.  Fact Sheet for Patients:   https://www.patel.info/  Fact Sheet for Healthcare Providers: https://hall.com/  This test is not yet approved or  cleared by the [place] FDA and has been authorized for detection and/or diagnosis of SARS-CoV-2 by FDA under an Emergency Use Authorization (EUA).  This EUA will remain in effect (meaning this test can be used) for the duration of the COVID-19 declaration under Section 564(b)(1) of the Act, 21 U.S.C. section 360bbb-3(b)(1), unless the authorization is terminated or revoked sooner.  Performed at [facility], [address], [place], [place] [address]      Time coordinating discharge:  28 minutes  SIGNED:   Barb Merino, MD  [facility] 11/07/2021, 9:52 AM

## 2021-11-07 NOTE — Progress Notes (Signed)
   Subjective/Chief Complaint: Feels back to baseline   Objective: Vital signs in last 24 hours: Temp:  [97.3 F (36.3 C)-98.4 F (36.9 C)] 97.7 F (36.5 C) (10/05 0438) Pulse Rate:  [48-63] 48 (10/05 0955) Resp:  [17] 17 (10/04 1259) BP: (110-131)/(47-70) 131/70 (10/05 0438) SpO2:  [97 %-99 %] 97 % (10/05 0438) Last BM Date : 11/06/21  Intake/Output from previous day: 10/04 0701 - 10/05 0700 In: 2354.4 [P.O.:360; I.V.:1994.4] Out: -  Intake/Output this shift: Total I/O In: 120 [P.O.:120] Out: -   Alert, well-appearing Unlabored respirations  Abdomen  Lab Results:  Recent Labs    11/05/21 0920 11/06/21 0928  WBC 12.3* 7.7  HGB 13.4 13.7  HCT 37.6 43.6  PLT 199 220   BMET Recent Labs    11/05/21 0843 11/06/21 0928  NA 138 139  K 4.0 3.5  CL 102 107  CO2 26 24  GLUCOSE 169* 117*  BUN 33* 21  CREATININE 0.95 0.86  CALCIUM 9.5 9.3   PT/INR No results for input(s): "LABPROT", "INR" in the last 72 hours. ABG No results for input(s): "PHART", "HCO3" in the last 72 hours.  Invalid input(s): "PCO2", "PO2"  Studies/Results: DG Abd 1 View  Result Date: 11/06/2021 CLINICAL DATA:  Small-bowel obstruction. EXAM: ABDOMEN - 1 VIEW COMPARISON:  CT scan from yesterday. FINDINGS: Scattered air and stool throughout the colon and down into the rectum. No air distended small bowel loops to suggest obstruction. No free air. The soft tissue shadows are maintained. No worrisome calcifications. The bony structures unremarkable. IMPRESSION: Nonobstructive bowel gas pattern. Electronically Signed   By: Marijo Sanes M.D.   On: 11/06/2021 09:25    Anti-infectives: Anti-infectives (From admission, onward)    None       Assessment/Plan: Quickly resolving but recurrent small bowel obstructions with complex surgical history.  Okay for discharge at this point. Outpatient follow up has previously been arranged.       LOS: 0 days    Cynthia Peters 11/07/2021

## 2021-12-24 NOTE — Telephone Encounter (Signed)
Spoke to patient Dr.Jordan's advice given. 

## 2022-04-08 ENCOUNTER — Telehealth: Payer: Self-pay

## 2022-04-08 ENCOUNTER — Ambulatory Visit: Payer: Medicare PPO | Admitting: Podiatry

## 2022-04-08 ENCOUNTER — Ambulatory Visit (INDEPENDENT_AMBULATORY_CARE_PROVIDER_SITE_OTHER): Payer: Medicare PPO

## 2022-04-08 DIAGNOSIS — M1A9XX0 Chronic gout, unspecified, without tophus (tophi): Secondary | ICD-10-CM | POA: Diagnosis not present

## 2022-04-08 DIAGNOSIS — M778 Other enthesopathies, not elsewhere classified: Secondary | ICD-10-CM

## 2022-04-08 DIAGNOSIS — M19072 Primary osteoarthritis, left ankle and foot: Secondary | ICD-10-CM

## 2022-04-09 NOTE — Telephone Encounter (Signed)
No further evaluation needed from me

## 2022-04-09 NOTE — Progress Notes (Signed)
Subjective:  Patient ID: Cynthia Peters, female    DOB: [birth year],  MRN: [id number] HPI Chief Complaint  Patient presents with   Toe Pain    Patient came in today for a left hallux pain and swelling which started 4 days ago, patient states it hurt to bend the toe, she is now feeling better and denies any pain, x-rays taken today     72 y.o. female presents with the above complaint.   ROS: Denies fever chills nausea vomit muscle aches pains calf pain back pain chest pain shortness of breath.  Past Medical History:  Diagnosis Date   Anemia    history in the past, not on a regular basis   Arthritis    Bronchitis    Cataracts, bilateral    immature   Claustrophobia    takes Valium daily as needed   Complication of anesthesia    2013 SURGERY vocal cord bothering pt, used smaller ent tube after 2 hernia repair, no problem after . " I woke up during my MRI and colonoscopy."        Depression    takes Wellbutrin daily   Dry eye    Dry mouth    GERD (gastroesophageal reflux disease)    takes Omeprazole daily    H/O hiatal hernia    Headache    migraines with flashing lights    History of blood clots 40 yrs    leg    History of echocardiogram 2013   a. Echo (05/2011):  Mild LVH, EF 65-70%, no WMA, Gr 1 DD, mild AI.   History of MRSA infection    History of staph infection    History of stress test 2010   a. ETT-Echo in 2010 was normal   History of TIA (transient ischemic attack) 2013   a. Carotid US (05/2011):  No ICA stenosis, pt unsure of this   Hyperlipidemia    takes Simvastatin daily    Hypertension    takes  Losartan daily   IBS (irritable bowel syndrome)    takes Bentyl daily as needed   Insomnia    takes Ambien nightly   Interstitial cystitis    Lichen planus    Mild asthma    Albuterol inhaler as needed   Palpitations    Event monitor in 2007 with rare PVCs // takes Metoprolol daily   Partial small bowel obstruction ([place]) 07/16/2017   PVC (premature  ventricular contraction)    Right radial head fracture 06/17/2016   Seasonal allergies    takes Claritin daily as needed.Takes Singulair daily as needed.   TIA (transient ischemic attack) 05/27/2011   Urethral stricture    Vertigo    takes Meclizine daily as needed   Past Surgical History:  Procedure Laterality Date   ARTHROTOMY Right 09/21/2020   Procedure: Right elbow open arthrotomy and synovectomy with radial head implant removal and repair reconstruction as necessary including ligamentous repair;  Surgeon: Roseanne Kaufman, MD;  Location: [place];  Service: Orthopedics;  Laterality: Right;  153mn   CESAREAN SECTION     COLONOSCOPY WITH PROPOFOL N/A 08/10/2012   Procedure: COLONOSCOPY WITH PROPOFOL;  Surgeon: MGarlan Fair MD;  Location: [facility];  Service: Endoscopy;  Laterality: N/A;   CYSTOSCOPY WITH URETHRAL DILATATION N/A 02/18/2013   Procedure: CYSTOSCOPY WITH URETHRAL DILATATION ( NO BALLOON) AND HYDRODISTENSION;  Surgeon: TAlexis Frock MD;  [facility]  Service: Urology;  Laterality: N/A;   ENDOMETRIAL ABLATION W/ HYDROTHERMABLATOR  09-27-2002  INCISIONAL HERNIA REPAIR N/A 10/25/2014   Procedure: HERNIA REPAIR INCISIONAL;  Surgeon: Ralene Ok, MD;  Location: [place];  Service: General;  Laterality: N/A;   [place] N/A 03/26/2015   Procedure: LAPAROSCOPIC INCISIONAL HERNIA;  Surgeon: Ralene Ok, MD;  Location: [place];  Service: General;  Laterality: N/A;   INSERTION OF MESH N/A 10/25/2014   Procedure: INSERTION OF MESH;  Surgeon: Ralene Ok, MD;  Location: [place];  Service: General;  Laterality: N/A;   LAPAROSCOPIC LYSIS OF ADHESIONS N/A 03/26/2015   Procedure: LAPAROSCOPIC LYSIS OF ADHESIONS;  Surgeon: Ralene Ok, MD;  Location: [place];  Service: General;  Laterality: N/A;   LAPAROTOMY N/A 10/25/2014   Procedure: EXPLORATORY LAPAROTOMY;  Surgeon: Ralene Ok, MD;  Location: [place];  Service: General;  Laterality: N/A;    LYSIS OF ADHESION N/A 10/25/2014   Procedure: LYSIS OF ADHESIONS ;  Surgeon: Ralene Ok, MD;  Location: [place];  Service: General;  Laterality: N/A;   MUCOSAL ADVANCEMENT FLAP N/A 10/25/2014   Procedure: MUCOSAL ADVANCEMENT FLAP;  Surgeon: Ralene Ok, MD;  Location: [place];  Service: General;  Laterality: N/A;   ORIF RADIAL FRACTURE Right 06/17/2016   Procedure: right radial head arthroplasty with ligament repair, reconstruciton;  Surgeon: Roseanne Kaufman, MD;  Location: [place];  Service: Orthopedics;  Laterality: Right;   RADIAL HEAD ARTHROPLASTY Right 06/17/2016   TOOTH EXTRACTION     TRANSTHORACIC ECHOCARDIOGRAM  05-26-2011   MILD LVH/  EF 123456  GRADE I DIASTOLIC DYSFUNCTION/  MILD AR//   06-20-2008  NOMRAL STRESS ECHO   UMBILICAL HERNIA REPAIR  2013   AND REVISION THE SAME YEAR W/ MESH   WISDOM TOOTH EXTRACTION  AGE 66    Current Outpatient Medications:    acetaminophen (TYLENOL) 500 MG tablet, Take 1,000 mg by mouth every 6 (six) hours as needed (pain). , Disp: , Rfl:    albuterol (VENTOLIN HFA) 108 (90 Base) MCG/ACT inhaler, Inhale 2 puffs into the lungs every 6 (six) hours as needed for wheezing or shortness of breath., Disp: , Rfl:    amLODipine (NORVASC) 5 MG tablet, Take 5 mg by mouth daily., Disp: , Rfl:    aspirin 81 MG chewable tablet, Chew 81 mg by mouth daily., Disp: , Rfl:    buPROPion (WELLBUTRIN XL) 300 MG 24 hr tablet, Take 300 mg by mouth daily., Disp: , Rfl:    Cholecalciferol (VITAMIN D) 2000 units CAPS, Take 2,000 Units by mouth daily., Disp: , Rfl:    clobetasol cream (TEMOVATE) [id number] %, Apply 1 application topically 2 (two) times daily as needed (rash). , Disp: , Rfl: 7   Coenzyme Q10 (CO Q 10) 100 MG CAPS, Take 100 mg by mouth daily. , Disp: , Rfl:    [employer] VITAMIN C 1000 MG tablet, Take 2,000 mg by mouth 2 (two) times daily., Disp: , Rfl: 1   diphenhydrAMINE (BENADRYL) 25 mg capsule, Take 1 capsule (25 mg total) by mouth every 6 (six) hours as needed for  itching or allergies., Disp: 30 capsule, Rfl: 0   fexofenadine (ALLEGRA) 180 MG tablet, Take 180 mg by mouth daily., Disp: , Rfl:    Flaxseed, Linseed, 1000 MG CAPS, Take 1,000 mg by mouth daily., Disp: , Rfl:    fluticasone (FLONASE) 50 MCG/ACT nasal spray, Place 2 sprays into both nostrils daily as needed for allergies or rhinitis., Disp: , Rfl:    hydrochlorothiazide 25 MG tablet, Take 25 mg by mouth daily., Disp: , Rfl:  hydrocortisone cream 1 %, Apply 1 application topically 4 (four) times daily as needed for itching., Disp: , Rfl:    ibuprofen (ADVIL) 200 MG tablet, Take 200-400 mg by mouth every 6 (six) hours as needed for mild pain., Disp: , Rfl:    losartan (COZAAR) 100 MG tablet, TAKE 1 TABLET BY MOUTH EVERY DAY (Patient taking differently: Take 100 mg by mouth daily.), Disp: 90 tablet, Rfl: 3   magnesium oxide (MAG-OX) 400 MG tablet, Take 400 mg by mouth daily., Disp: , Rfl:    meclizine (ANTIVERT) 25 MG tablet, Take 1 tablet (25 mg total) by mouth 3 (three) times daily as needed for dizziness., Disp: 30 tablet, Rfl: 0   metoprolol succinate (TOPROL-XL) 100 MG 24 hr tablet, Take 1 tablet (100 mg total) by mouth 2 (two) times daily. Take with or immediately following a meal. Take 100 mg by mouth 2 (two) times daily. Take with or immediately following a meal. (Patient taking differently: Take 100 mg by mouth 2 (two) times daily. Take with or immediately following a meal.), Disp: 30 tablet, Rfl: 1   montelukast (SINGULAIR) 10 MG tablet, Take 10 mg by mouth at bedtime., Disp: , Rfl:    Multiple Vitamins-Minerals (MULTIPLE VITAMINS/WOMENS PO), Take 1 tablet by mouth daily., Disp: , Rfl:    mupirocin ointment (BACTROBAN) 2 %, Apply 1 application topically 3 (three) times daily as needed for rash., Disp: , Rfl:    pantoprazole (PROTONIX) 20 MG tablet, Take 20 mg by mouth daily., Disp: , Rfl:    Polyethyl Glyc-Propyl Glyc PF (SYSTANE ULTRA PF) 0.4-0.3 % SOLN, Place 1 drop into both eyes 4 (four)  times daily as needed (Dry eye)., Disp: , Rfl:    polyethylene glycol (MIRALAX / GLYCOLAX) 17 g packet, Take 17 g by mouth 2 (two) times daily. (Patient taking differently: Take 17 g by mouth daily.), Disp: 14 each, Rfl: 0   potassium chloride SA (KLOR-CON M20) 20 MEQ tablet, TAKE 1&1/2 TABLETS BY MOUTH DAILY. (Patient taking differently: Take 30 mEq by mouth daily.), Disp: 135 tablet, Rfl: 3   pravastatin (PRAVACHOL) 40 MG tablet, Take 40 mg by mouth daily., Disp: , Rfl:    Probiotic Product (ALIGN PO), Take 1 capsule by mouth 2 (two) times daily., Disp: , Rfl:    valACYclovir (VALTREX) 500 MG tablet, INCREASE TO TAKING 1 TABLET 2 TIMES A DAY FOR 3 DAYS WITH OUTBREAK (Patient taking differently: Take 500 mg by mouth daily.), Disp: 30 tablet, Rfl: 0  Allergies  Allergen Reactions   Hydrocodone Rash   Hydrocodone-Acetaminophen Rash   Tramadol Hives and Palpitations   Review of Systems Objective:  There were no vitals filed for this visit.  General: Well developed, nourished, in no acute distress, alert and oriented x3   Dermatological: Skin is warm, dry and supple bilateral. Nails x 10 are well maintained; remaining integument appears unremarkable at this time. There are no open sores, no preulcerative lesions, no rash or signs of infection present.  Vascular: Dorsalis Pedis artery and Posterior Tibial artery pedal pulses are 2/4 bilateral with immedate capillary fill time. Pedal hair growth present. No varicosities and no lower extremity edema present bilateral.   Neruologic: Grossly intact via light touch bilateral. Vibratory intact via tuning fork bilateral. Protective threshold with Semmes Wienstein monofilament intact to all pedal sites bilateral. Patellar and Achilles deep tendon reflexes 2+ bilateral. No Babinski or clonus noted bilateral.   Musculoskeletal: No gross boney pedal deformities bilateral. No pain, crepitus, or limitation  noted with foot and ankle range of motion bilateral.  Muscular strength 5/5 in all groups tested bilateral.  No reproducible pain on palpation or range of motion of the metatarsophalangeal joint or interphalangeal joint of the hallux.  Gait: Unassisted, Nonantalgic.    Radiographs:  Radiographs demonstrate an osseously mature individual with generalized demineralization of the bone severe skew foot deformity.  Joint space narrowing of the first metatarsophalangeal joint consistent with early osteoarthritic changes.  No fractures or acute findings identified.  Assessment & Plan:   Assessment: Capsulitis first metatarsophalangeal joint.  Plan: At this point I think that it very well could have been a short gout attack.  I expressed to her that should this occur once again she will notify us immediately and we will have an arthritic panel drawn.  Otherwise follow-up with [place] on an as-needed basis.     Rakiyah Esch T. [place], [place]

## 2022-05-08 ENCOUNTER — Telehealth: Payer: Self-pay | Admitting: *Deleted

## 2022-05-08 DIAGNOSIS — M1A9XX0 Chronic gout, unspecified, without tophus (tophi): Secondary | ICD-10-CM

## 2022-05-08 MED ORDER — METHYLPREDNISOLONE 4 MG PO TBPK: ORAL_TABLET | ORAL | 0 refills | Status: DC

## 2022-05-08 NOTE — Telephone Encounter (Signed)
Cynthia Peters, this pt is on the line, stating that she is experiencing the same pain as before. According to office visit notes, Dr. Milinda Pointer told her to call immediately so that she can be seen. Is she able to be put on the sched for today?   Patient will be sent for bloodwork, that she will pick up at the office and I will send in a prednisone pack for her to start.

## 2022-05-10 LAB — ANTI-NUCLEAR AB-TITER (ANA TITER)
ANA TITER: 1:80 {titer} — ABNORMAL HIGH
ANA Titer 1: 1:80 {titer} — ABNORMAL HIGH

## 2022-05-10 LAB — CBC WITH DIFFERENTIAL/PLATELET
Absolute Monocytes: 608 cells/uL (ref 200–950)
Basophils Absolute: 72 cells/uL (ref 0–200)
Basophils Relative: 0.9 %
Eosinophils Absolute: 344 cells/uL (ref 15–500)
Eosinophils Relative: 4.3 %
HCT: 41.3 % (ref 35.0–45.0)
Hemoglobin: 14.5 g/dL (ref 11.7–15.5)
Lymphs Abs: 1440 cells/uL (ref 850–3900)
MCH: 32.4 pg (ref 27.0–33.0)
MCHC: 35.1 g/dL (ref 32.0–36.0)
MCV: 92.2 fL (ref 80.0–100.0)
MPV: 11 fL (ref 7.5–12.5)
Monocytes Relative: 7.6 %
Neutro Abs: 5536 cells/uL (ref 1500–7800)
Neutrophils Relative %: 69.2 %
Platelets: 220 10*3/uL (ref 140–400)
RBC: 4.48 10*6/uL (ref 3.80–5.10)
RDW: 11.9 % (ref 11.0–15.0)
Total Lymphocyte: 18 %
WBC: 8 10*3/uL (ref 3.8–10.8)

## 2022-05-10 LAB — ANA: Anti Nuclear Antibody (ANA): POSITIVE — AB

## 2022-05-10 LAB — RHEUMATOID FACTOR: Rheumatoid fact SerPl-aCnc: <14 IU/mL (ref <14)

## 2022-05-10 LAB — C-REACTIVE PROTEIN: CRP: 5.2 mg/L (ref <8.0)

## 2022-05-10 LAB — URIC ACID: Uric Acid, Serum: 4.9 mg/dL (ref 2.5–7.0)

## 2022-05-10 LAB — SEDIMENTATION RATE: Sed Rate: 9 mm/h (ref 0–30)

## 2022-05-12 ENCOUNTER — Telehealth: Payer: Self-pay | Admitting: *Deleted

## 2022-05-12 DIAGNOSIS — R768 Other specified abnormal immunological findings in serum: Secondary | ICD-10-CM

## 2022-05-12 NOTE — Telephone Encounter (Signed)
-----   Message from Elinor Parkinson, [place] sent at 05/12/2022  6:53 AM EDT ----- Needs to go to rheumatology please.

## 2022-05-12 NOTE — Telephone Encounter (Signed)
Referral placed for Rheumatology

## 2022-07-01 ENCOUNTER — Observation Stay (HOSPITAL_BASED_OUTPATIENT_CLINIC_OR_DEPARTMENT_OTHER)
Admission: EM | Admit: 2022-07-01 | Discharge: 2022-07-02 | Disposition: A | Discharge status: Home / Self Care | Payer: Medicare PPO | Attending: Internal Medicine | Admitting: Internal Medicine

## 2022-07-01 ENCOUNTER — Encounter (HOSPITAL_BASED_OUTPATIENT_CLINIC_OR_DEPARTMENT_OTHER): Payer: Self-pay

## 2022-07-01 ENCOUNTER — Emergency Department (HOSPITAL_BASED_OUTPATIENT_CLINIC_OR_DEPARTMENT_OTHER): Payer: Medicare PPO

## 2022-07-01 ENCOUNTER — Other Ambulatory Visit: Payer: Self-pay

## 2022-07-01 DIAGNOSIS — R109 Unspecified abdominal pain: Secondary | ICD-10-CM | POA: Diagnosis present

## 2022-07-01 DIAGNOSIS — I10 Essential (primary) hypertension: Secondary | ICD-10-CM | POA: Diagnosis not present

## 2022-07-01 DIAGNOSIS — K56609 Unspecified intestinal obstruction, unspecified as to partial versus complete obstruction: Principal | ICD-10-CM | POA: Insufficient documentation

## 2022-07-01 DIAGNOSIS — D72829 Elevated white blood cell count, unspecified: Secondary | ICD-10-CM

## 2022-07-01 DIAGNOSIS — Z8673 Personal history of transient ischemic attack (TIA), and cerebral infarction without residual deficits: Secondary | ICD-10-CM | POA: Diagnosis not present

## 2022-07-01 DIAGNOSIS — Z79899 Other long term (current) drug therapy: Secondary | ICD-10-CM | POA: Diagnosis not present

## 2022-07-01 DIAGNOSIS — J4520 Mild intermittent asthma, uncomplicated: Secondary | ICD-10-CM | POA: Insufficient documentation

## 2022-07-01 DIAGNOSIS — Z7982 Long term (current) use of aspirin: Secondary | ICD-10-CM | POA: Insufficient documentation

## 2022-07-01 DIAGNOSIS — Z87891 Personal history of nicotine dependence: Secondary | ICD-10-CM | POA: Insufficient documentation

## 2022-07-01 DIAGNOSIS — E782 Mixed hyperlipidemia: Secondary | ICD-10-CM | POA: Diagnosis present

## 2022-07-01 LAB — URINALYSIS, ROUTINE W REFLEX MICROSCOPIC
Bilirubin Urine: NEGATIVE
Glucose, UA: NEGATIVE mg/dL
Hgb urine dipstick: NEGATIVE
Ketones, ur: NEGATIVE mg/dL
Leukocytes,Ua: NEGATIVE
Nitrite: NEGATIVE
Protein, ur: 30 mg/dL — AB
Specific Gravity, Urine: 1.02 (ref 1.005–1.030)
pH: 8.5 — ABNORMAL HIGH (ref 5.0–8.0)

## 2022-07-01 LAB — CBC
HCT: 41.8 % (ref 36.0–46.0)
Hemoglobin: 14.9 g/dL (ref 12.0–15.0)
MCH: 32.6 pg (ref 26.0–34.0)
MCHC: 35.6 g/dL (ref 30.0–36.0)
MCV: 91.5 fL (ref 80.0–100.0)
Platelets: 206 10*3/uL (ref 150–400)
RBC: 4.57 MIL/uL (ref 3.87–5.11)
RDW: 12 % (ref 11.5–15.5)
WBC: 15.1 10*3/uL — ABNORMAL HIGH (ref 4.0–10.5)
nRBC: 0 % (ref 0.0–0.2)

## 2022-07-01 LAB — URINALYSIS, MICROSCOPIC (REFLEX)
Bacteria, UA: NONE SEEN
RBC / HPF: NONE SEEN RBC/hpf (ref 0–5)
Squamous Epithelial / HPF: NONE SEEN /HPF (ref 0–5)

## 2022-07-01 LAB — COMPREHENSIVE METABOLIC PANEL
ALT: 21 U/L (ref 0–44)
AST: 29 U/L (ref 15–41)
Albumin: 4.4 g/dL (ref 3.5–5.0)
Alkaline Phosphatase: 67 U/L (ref 38–126)
Anion gap: 11 (ref 5–15)
BUN: 25 mg/dL — ABNORMAL HIGH (ref 8–23)
CO2: 25 mmol/L (ref 22–32)
Calcium: 9.4 mg/dL (ref 8.9–10.3)
Chloride: 101 mmol/L (ref 98–111)
Creatinine, Ser: 0.84 mg/dL (ref 0.44–1.00)
GFR, Estimated: >60 mL/min (ref >60)
Glucose, Bld: 158 mg/dL — ABNORMAL HIGH (ref 70–99)
Potassium: 4.1 mmol/L (ref 3.5–5.1)
Sodium: 137 mmol/L (ref 135–145)
Total Bilirubin: 1.5 mg/dL — ABNORMAL HIGH (ref 0.3–1.2)
Total Protein: 7.3 g/dL (ref 6.5–8.1)

## 2022-07-01 LAB — LIPASE, BLOOD: Lipase: 29 U/L (ref 11–51)

## 2022-07-01 LAB — LACTIC ACID, PLASMA: Lactic Acid, Venous: 1.3 mmol/L (ref 0.5–1.9)

## 2022-07-01 MED ORDER — ASPIRIN 81 MG PO CHEW: 81.0000 mg | CHEWABLE_TABLET | Freq: Every day | ORAL | Status: DC

## 2022-07-01 MED ORDER — HYDROCHLOROTHIAZIDE 25 MG PO TABS
25.0000 mg | ORAL_TABLET | Freq: Every day | ORAL | Status: DC
  Administered 2022-07-01 – 2022-07-02 (×2): 25 mg via ORAL
  Filled 2022-07-01 (×2): qty 1

## 2022-07-01 MED ORDER — MONTELUKAST SODIUM 10 MG PO TABS
10.0000 mg | ORAL_TABLET | Freq: Every day | ORAL | Status: DC
  Administered 2022-07-01: 10 mg via ORAL
  Filled 2022-07-01: qty 1

## 2022-07-01 MED ORDER — SODIUM CHLORIDE 0.9 % IV SOLN: INTRAVENOUS | Status: AC

## 2022-07-01 MED ORDER — ALUM & MAG HYDROXIDE-SIMETH 200-200-20 MG/5ML PO SUSP
30.0000 mL | Freq: Four times a day (QID) | ORAL | Status: DC | PRN
  Administered 2022-07-01: 30 mL via ORAL
  Filled 2022-07-01: qty 30

## 2022-07-01 MED ORDER — ENOXAPARIN SODIUM 40 MG/0.4ML IJ SOSY
40.0000 mg | PREFILLED_SYRINGE | Freq: Every day | INTRAMUSCULAR | Status: DC
  Administered 2022-07-01: 40 mg via SUBCUTANEOUS
  Filled 2022-07-01: qty 0.4

## 2022-07-01 MED ORDER — PANTOPRAZOLE SODIUM 20 MG PO TBEC
20.0000 mg | DELAYED_RELEASE_TABLET | Freq: Every day | ORAL | Status: DC
  Administered 2022-07-01 – 2022-07-02 (×2): 20 mg via ORAL
  Filled 2022-07-01 (×2): qty 1

## 2022-07-01 MED ORDER — ONDANSETRON HCL 4 MG/2ML IJ SOLN: 4.0000 mg | Freq: Four times a day (QID) | INTRAMUSCULAR | Status: DC | PRN

## 2022-07-01 MED ORDER — AMLODIPINE BESYLATE 5 MG PO TABS
5.0000 mg | ORAL_TABLET | Freq: Every day | ORAL | Status: DC
  Administered 2022-07-01 – 2022-07-02 (×2): 5 mg via ORAL
  Filled 2022-07-01 (×2): qty 1

## 2022-07-01 MED ORDER — HYDROMORPHONE HCL 1 MG/ML IJ SOLN
0.5000 mg | Freq: Once | INTRAMUSCULAR | Status: AC
  Administered 2022-07-01: 0.5 mg via INTRAVENOUS
  Filled 2022-07-01: qty 1

## 2022-07-01 MED ORDER — ONDANSETRON HCL 4 MG PO TABS: 4.0000 mg | ORAL_TABLET | Freq: Four times a day (QID) | ORAL | Status: DC | PRN

## 2022-07-01 MED ORDER — METOPROLOL TARTRATE 5 MG/5ML IV SOLN: 5.0000 mg | Freq: Four times a day (QID) | INTRAVENOUS | Status: DC | PRN

## 2022-07-01 MED ORDER — FLUTICASONE PROPIONATE 50 MCG/ACT NA SUSP: 2.0000 | Freq: Every day | NASAL | Status: DC | PRN

## 2022-07-01 MED ORDER — BUPROPION HCL ER (XL) 150 MG PO TB24
300.0000 mg | ORAL_TABLET | Freq: Every day | ORAL | Status: DC
  Administered 2022-07-01 – 2022-07-02 (×2): 300 mg via ORAL
  Filled 2022-07-01 (×2): qty 2

## 2022-07-01 MED ORDER — ACETAMINOPHEN 500 MG PO TABS: 1000.0000 mg | ORAL_TABLET | Freq: Four times a day (QID) | ORAL | Status: DC | PRN

## 2022-07-01 MED ORDER — HYDROMORPHONE HCL 1 MG/ML IJ SOLN: 0.5000 mg | INTRAMUSCULAR | Status: DC | PRN

## 2022-07-01 MED ORDER — LACTATED RINGERS IV BOLUS
1000.0000 mL | Freq: Once | INTRAVENOUS | Status: AC
  Administered 2022-07-01: 1000 mL via INTRAVENOUS

## 2022-07-01 MED ORDER — ALBUTEROL SULFATE (2.5 MG/3ML) 0.083% IN NEBU: 2.5000 mg | INHALATION_SOLUTION | RESPIRATORY_TRACT | Status: DC | PRN

## 2022-07-01 NOTE — ED Triage Notes (Signed)
Pt states that she started having some abdominal pain that started last night. States that she has hx of obstruction in the past. States that pain has progressively gotten worse.

## 2022-07-01 NOTE — H&P (Signed)
History and Physical  Cynthia Peters ZOX:[id number] DOB: April 14, 1950 DOA: 07/01/2022  PCP: Cynthia Peters., PA-C   Chief Complaint:  recurrent abdominal pain  HPI: Cynthia Peters is a 72 y.o. female with medical history significant for IBS, GERD, hernia repair and recurrent partial small bowel obstruction being admitted to the hospital with several hours of abdominal pain and nausea, with recurrent small bowel obstruction.  Patient states that she has been in his usual state of health of late, but last evening she had sudden onset of bandlike nonradiating abdominal pain and associated nausea without vomiting.  She had a normal bowel movement yesterday.  Early this morning, since the pain continued she came to the emergency department for evaluation.  CT scan as noted below with small bowel dilation without obvious transition point, indicating possible partial small bowel obstruction.  Patient never vomited, she has continued to pass flatus and hospitalist was contacted for admission.  She specifically denies any chest pain, fevers, chills, vomiting, diarrhea.  She currently is not hungry, but has no pain and no nausea.  Review of Systems: Please see HPI for pertinent positives and negatives. A complete 10 system review of systems are otherwise negative.  Past Medical History:  Diagnosis Date   Anemia    history in the past, not on a regular basis   Arthritis    Bronchitis    Cataracts, bilateral    immature   Claustrophobia    takes Valium daily as needed   Complication of anesthesia    2013 SURGERY vocal cord bothering pt, used smaller ent tube after 2 hernia repair, no problem after . " I woke up during my MRI and colonoscopy."        Depression    takes Wellbutrin daily   Dry eye    Dry mouth    GERD (gastroesophageal reflux disease)    takes Omeprazole daily    H/O hiatal hernia    Headache    migraines with flashing lights    History of blood clots 40 yrs    leg     History of echocardiogram 2013   a. Echo (05/2011):  Mild LVH, EF 65-70%, no WMA, Gr 1 DD, mild AI.   History of MRSA infection    History of staph infection    History of stress test 2010   a. ETT-Echo in 2010 was normal   History of TIA (transient ischemic attack) 2013   a. Carotid US (05/2011):  No ICA stenosis, pt unsure of this   Hyperlipidemia    takes Simvastatin daily    Hypertension    takes  Losartan daily   IBS (irritable bowel syndrome)    takes Bentyl daily as needed   Insomnia    takes Ambien nightly   Interstitial cystitis    Lichen planus    Mild asthma    Albuterol inhaler as needed   Palpitations    Event monitor in 2007 with rare PVCs // takes Metoprolol daily   Partial small bowel obstruction (HCC) 07/16/2017   PVC (premature ventricular contraction)    Right radial head fracture 06/17/2016   Seasonal allergies    takes Claritin daily as needed.Takes Singulair daily as needed.   TIA (transient ischemic attack) 05/27/2011   Urethral stricture    Vertigo    takes Meclizine daily as needed   Past Surgical History:  Procedure Laterality Date   ARTHROTOMY Right 09/21/2020   Procedure: Right elbow open arthrotomy and synovectomy with  radial head implant removal and repair reconstruction as necessary including ligamentous repair;  Surgeon: Dominica Severin, MD;  Location: [facility] OR;  Service: Orthopedics;  Laterality: Right;    CESAREAN SECTION     COLONOSCOPY WITH PROPOFOL N/A 08/10/2012   Procedure: COLONOSCOPY WITH PROPOFOL;  Surgeon: Charolett Bumpers, MD;  Location: [facility];  Service: Endoscopy;  Laterality: N/A;   CYSTOSCOPY WITH URETHRAL DILATATION N/A 02/18/2013   Procedure: CYSTOSCOPY WITH URETHRAL DILATATION ( NO BALLOON) AND HYDRODISTENSION;  Surgeon: Sebastian Ache, MD;  Location: [facility];  Service: Urology;  Laterality: N/A;   ENDOMETRIAL ABLATION W/ HYDROTHERMABLATOR  09-27-2002   INCISIONAL HERNIA REPAIR N/A 10/25/2014    Procedure: HERNIA REPAIR INCISIONAL;  Surgeon: Axel Filler, MD;  Location: [facility] OR;  Service: General;  Laterality: N/A;   INCISIONAL HERNIA REPAIR N/A 03/26/2015   Procedure: LAPAROSCOPIC INCISIONAL HERNIA;  Surgeon: Axel Filler, MD;  Location: [facility] OR;  Service: General;  Laterality: N/A;   INSERTION OF MESH N/A 10/25/2014   Procedure: INSERTION OF MESH;  Surgeon: Axel Filler, MD;  Location: [facility] OR;  Service: General;  Laterality: N/A;   LAPAROSCOPIC LYSIS OF ADHESIONS N/A 03/26/2015   Procedure: LAPAROSCOPIC LYSIS OF ADHESIONS;  Surgeon: Axel Filler, MD;  Location: [facility] OR;  Service: General;  Laterality: N/A;   LAPAROTOMY N/A 10/25/2014   Procedure: EXPLORATORY LAPAROTOMY;  Surgeon: Axel Filler, MD;  Location: [facility] OR;  Service: General;  Laterality: N/A;   LYSIS OF ADHESION N/A 10/25/2014   Procedure: LYSIS OF ADHESIONS ;  Surgeon: Axel Filler, MD;  Location: [facility];  Service: General;  Laterality: N/A;   MUCOSAL ADVANCEMENT FLAP N/A 10/25/2014   Procedure: MUCOSAL ADVANCEMENT FLAP;  Surgeon: Axel Filler, MD;  Location: [facility] OR;  Service: General;  Laterality: N/A;   ORIF RADIAL FRACTURE Right 06/17/2016   Procedure: right radial head arthroplasty with ligament repair, reconstruciton;  Surgeon: Dominica Severin, MD;  Location: [facility] OR;  Service: Orthopedics;  Laterality: Right;   RADIAL HEAD ARTHROPLASTY Right 06/17/2016   TOOTH EXTRACTION     TRANSTHORACIC ECHOCARDIOGRAM  05-26-2011   MILD LVH/  EF 65-70%/  GRADE I DIASTOLIC DYSFUNCTION/  MILD AR//   06-20-2008  NOMRAL STRESS ECHO   UMBILICAL HERNIA REPAIR  2013   AND REVISION THE SAME YEAR W/ MESH   WISDOM TOOTH EXTRACTION  AGE 49    Social History:  reports that she quit smoking about 46 years ago. Her smoking use included cigarettes. She has a 0.50 pack-year smoking history. She has never used smokeless tobacco. She reports current alcohol use. She reports that she does not use drugs.   Allergies  Allergen Reactions    Hydrocodone Rash   Hydrocodone-Acetaminophen Rash   Tramadol Hives and Palpitations    Family History  Problem Relation Age of Onset   Dementia Mother    Leukemia Child    Cirrhosis Father      Prior to Admission medications   Medication Sig Start Date End Date Taking? Authorizing Provider  acetaminophen (TYLENOL) 500 MG tablet Take 1,000 mg by mouth every 6 (six) hours as needed (pain).     [provider]  albuterol (VENTOLIN HFA) 108 (90 Base) MCG/ACT inhaler Inhale 2 puffs into the lungs every 6 (six) hours as needed for wheezing or shortness of breath.    [provider]  amLODipine (NORVASC) 5 MG tablet Take 5 mg by mouth daily. 05/21/20   [provider]  aspirin 81 MG chewable tablet Chew 81  mg by mouth daily.    [provider]  buPROPion (WELLBUTRIN XL) 300 MG 24 hr tablet Take 300 mg by mouth daily.    [provider]  Cholecalciferol (VITAMIN D) 2000 units CAPS Take 2,000 Units by mouth daily.    [provider]  clobetasol cream (TEMOVATE) 0.05 % Apply 1 application topically 2 (two) times daily as needed (rash).  04/01/16   [provider]  Coenzyme Q10 (CO Q 10) 100 MG CAPS Take 100 mg by mouth daily.     [provider]  [employer] VITAMIN C 1000 MG tablet Take 2,000 mg by mouth 2 (two) times daily. 06/13/16   [provider]  diphenhydrAMINE (BENADRYL) 25 mg capsule Take 1 capsule (25 mg total) by mouth every 6 (six) hours as needed for itching or allergies. 11/07/21   Dorcas Carrow, MD  fexofenadine (ALLEGRA) 180 MG tablet Take 180 mg by mouth daily.    [provider]  Flaxseed, Linseed, 1000 MG CAPS Take 1,000 mg by mouth daily.    [provider]  fluticasone (FLONASE) 50 MCG/ACT nasal spray Place 2 sprays into both nostrils daily as needed for allergies or rhinitis.    [provider]  hydrochlorothiazide 25 MG tablet Take 25 mg by mouth daily.    [provider]   hydrocortisone cream 1 % Apply 1 application topically 4 (four) times daily as needed for itching.    [provider]  ibuprofen (ADVIL) 200 MG tablet Take 200-400 mg by mouth every 6 (six) hours as needed for mild pain.    [provider]  losartan (COZAAR) 100 MG tablet TAKE 1 TABLET BY MOUTH EVERY DAY Patient taking differently: Take 100 mg by mouth daily. 09/14/17   [place], Peter M, MD  magnesium oxide (MAG-OX) 400 MG tablet Take 400 mg by mouth daily.    [provider]  meclizine (ANTIVERT) 25 MG tablet Take 1 tablet (25 mg total) by mouth 3 (three) times daily as needed for dizziness. 05/02/20   Robinson, [place] N, PA-C  methylPREDNISolone (MEDROL DOSEPAK) 4 MG TBPK tablet 6 day dose pack - take as directed 05/08/22   Hyatt, Max T, DPM  metoprolol succinate (TOPROL-XL) 100 MG 24 hr tablet Take 1 tablet (100 mg total) by mouth 2 (two) times daily. Take with or immediately following a meal. Take 100 mg by mouth 2 (two) times daily. Take with or immediately following a meal. Patient taking differently: Take 100 mg by mouth 2 (two) times daily. Take with or immediately following a meal. 07/14/18   [place], Peter M, MD  montelukast (SINGULAIR) 10 MG tablet Take 10 mg by mouth at bedtime.    [provider]  Multiple Vitamins-Minerals (MULTIPLE VITAMINS/WOMENS PO) Take 1 tablet by mouth daily.    [provider]  mupirocin ointment (BACTROBAN) 2 % Apply 1 application topically 3 (three) times daily as needed for rash. 06/20/20   [provider]  pantoprazole (PROTONIX) 20 MG tablet Take 20 mg by mouth daily. 05/21/20   [provider]  Polyethyl Glyc-Propyl Glyc PF (SYSTANE ULTRA PF) 0.4-0.3 % SOLN Place 1 drop into both eyes 4 (four) times daily as needed (Dry eye).    [provider]  polyethylene glycol (MIRALAX / GLYCOLAX) 17 g packet Take 17 g by mouth 2 (two) times daily. Patient taking differently: Take 17 g by mouth daily.  11/30/19   Dorcas Carrow, MD  potassium chloride SA (KLOR-CON M20) 20 MEQ tablet  TAKE 1&1/2 TABLETS BY MOUTH DAILY. Patient taking differently: Take 30 mEq by mouth daily. 02/16/20   [place], Peter M, MD  pravastatin (PRAVACHOL) 40 MG tablet Take 40 mg by mouth daily. 05/21/20   [provider]  Probiotic Product (ALIGN PO) Take 1 capsule by mouth 2 (two) times daily.    [provider]  valACYclovir (VALTREX) 500 MG tablet INCREASE TO TAKING 1 TABLET 2 TIMES A DAY FOR 3 DAYS WITH OUTBREAK Patient taking differently: Take 500 mg by mouth daily. 03/25/16   Ria Comment, FNP    Physical Exam: BP (!) 155/57 (BP Location: Left Arm) Comment: notify the nurse Dahlia Client.  Pulse 61   Temp 98.3 F (36.8 C) (Oral)   Resp 16   Ht 5\' 3"  (1.6 m)   Wt 68 kg   LMP 02/04/2004   SpO2 99%   BMI 26.57 kg/m   General:  Alert, oriented, calm, in no acute distress  Eyes: EOMI, clear conjuctivae, white sclerea Neck: supple, no masses, trachea mildline  Cardiovascular: RRR, no murmurs or rubs, no peripheral edema  Respiratory: clear to auscultation bilaterally, no wheezes, no crackles  Abdomen: soft, nontender to deep palpation, abdomen is slightly distended, normal bowel tones heard  Skin: dry, no rashes  Musculoskeletal: no joint effusions, normal range of motion  Psychiatric: appropriate affect, normal speech  Neurologic: extraocular muscles intact, clear speech, moving all extremities with intact sensorium          Labs on Admission:  Basic Metabolic Panel: Recent Labs  Lab 07/01/22 0943  NA 137  K 4.1  CL 101  CO2 25  GLUCOSE 158*  BUN 25*  CREATININE 0.84  CALCIUM 9.4   Liver Function Tests: Recent Labs  Lab 07/01/22 0943  AST 29  ALT 21  ALKPHOS 67  BILITOT 1.5*  PROT 7.3  ALBUMIN 4.4   Recent Labs  Lab 07/01/22 0943  LIPASE 29   No results for input(s): "AMMONIA" in the last 168 hours. CBC: Recent Labs  Lab 07/01/22 0943  WBC 15.1*  HGB 14.9  HCT  41.8  MCV 91.5  PLT 206   Cardiac Enzymes: No results for input(s): "CKTOTAL", "CKMB", "CKMBINDEX", "TROPONINI" in the last 168 hours.  BNP (last 3 results) No results for input(s): "BNP" in the last 8760 hours.  ProBNP (last 3 results) No results for input(s): "PROBNP" in the last 8760 hours.  CBG: No results for input(s): "GLUCAP" in the last 168 hours.  Radiological Exams on Admission: CT ABDOMEN PELVIS WO CONTRAST  Result Date: 07/01/2022 CLINICAL DATA:  Bowel obstruction suspected, abdominal pain EXAM: CT ABDOMEN AND PELVIS WITHOUT CONTRAST TECHNIQUE: Multidetector CT imaging of the abdomen and pelvis was performed following the standard protocol without IV contrast. RADIATION DOSE REDUCTION: This exam was performed according to the departmental dose-optimization program which includes automated exposure control, adjustment of the mA and/or kV according to patient size and/or use of iterative reconstruction technique. COMPARISON:  Previous studies including the examination of 11/05/2021 FINDINGS: Lower chest: No acute findings are seen in the lower lung fields. Hepatobiliary: There are few low-density lesions in liver measuring up to 2.7 cm in diameter suggesting possible cysts. There is no dilation of bile ducts. Gallbladder is not distended. Pancreas: No focal abnormalities are seen. Spleen: Unremarkable. Adrenals/Urinary Tract: There is no hydronephrosis. There are no renal or ureteral stones. Urinary bladder is not distended. Stomach/Bowel: Small hiatal hernia is seen. There is moderate distention of stomach with fluid in the lumen. There  is dilation of proximal small bowel loops measuring up to 2.9 cm. Distal and terminal ileum are not dilated. Zone of transition appears to be in right mid to right lower abdomen. No demonstrable focal masses are seen. Appendix is not distinctly seen. There is no pericecal inflammation. Few scattered diverticula are seen in colon without signs of focal  acute diverticulitis. Vascular/Lymphatic: Scattered arterial calcifications are seen. Reproductive: Unremarkable. Other: There is no ascites or pneumoperitoneum. Right inguinal hernia containing fat is seen. Musculoskeletal: Degenerative changes are noted in lower thoracic spine and lumbar spine. There is anterolisthesis at the L4-L5 level. There is spinal stenosis at the L4-L5 level. IMPRESSION: There is abnormal dilation of proximal small bowel loops with decompression of distal small bowel loops suggesting partial small bowel obstruction. Zone of transition appears to be in right mid mild right lower abdomen. Findings suggest small bowel obstruction caused by possibly adhesions or internal hernia. There is no hydronephrosis. There is no significant ascites. There is no pneumoperitoneum. There are few low-density lesions in liver, possibly cysts. Diverticulosis of colon without signs of focal diverticulitis. Lumbar spondylosis with spinal stenosis at L4-L5 level. Electronically Signed   By: Ernie Avena M.D.   On: 07/01/2022 12:21    Assessment/Plan 72 year old female with history of hypertension, GERD, IBS and recurrent small bowel obstruction being admitted to the hospital with concern for partial small bowel obstruction already improving.  She is currently pain and nausea free.  Partial small bowel obstruction-already resolving -Observation admission to MedSurg -IV fluids -Pain and nausea control as needed -Clear liquid diet tonight, anticipate advancing to soft diet in the morning -Message sent to general surgery, agrees with the plan and recommends consult on call provideer in the morning if not improving  Depression-continue home Wellbutrin  Hypertension-resume home HCTZ and amlodipine  GERD-continue oral PPI  DVT prophylaxis: Lovenox     Code Status: Full Code  Consults called: Secure chat sent to general surgery on-call Dr. Dossie Der.  Admission status: Observation  Time  spent: 56 minutes  Joseph Johns Sharlette Dense MD [facility] Pager [phone]  If 7PM-7AM, please contact night-coverage [email or web address] Password [facility]  07/01/2022, 5:36 PM

## 2022-07-01 NOTE — ED Provider Notes (Signed)
[place] [facility] AT [facility] Provider Note   CSN: [id number] Arrival date & time: 07/01/22  0915     History  Chief Complaint  Patient presents with   Abdominal Pain    Cynthia Peters is a 72 y.o. female.  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  Patient reports she was fine yesterday.  She reports she suddenly got pain and distention in her abdomen at about 3 in the morning.  She reports this is typical.  She will be fine and then just all of a sudden she gets pain.  She denies any vomiting.  She reports she does not typically get vomiting with these episodes.  She denies recent constipation or diarrhea.  No recent urinary symptoms.    Home Medications Prior to Admission medications   Medication Sig Start Date End Date Taking? Authorizing Provider  acetaminophen (TYLENOL) 500 MG tablet Take 1,000 mg by mouth every 6 (six) hours as needed (pain).     [provider]  albuterol (VENTOLIN HFA) 108 (90 Base) MCG/ACT inhaler Inhale 2 puffs into the lungs every 6 (six) hours as needed for wheezing or shortness of breath.    [provider]  amLODipine (NORVASC) 5 MG tablet Take 5 mg by mouth daily. 05/21/20   [provider]  aspirin 81 MG chewable tablet Chew 81 mg by mouth daily.    [provider]  buPROPion (WELLBUTRIN XL) 300 MG 24 hr tablet Take 300 mg by mouth daily.    [provider]  Cholecalciferol (VITAMIN D) 2000 units CAPS Take 2,000 Units by mouth daily.    [provider]  clobetasol cream (TEMOVATE) 0.05 % Apply 1 application topically 2 (two) times daily as needed (rash).  04/01/16   [provider]  Coenzyme Q10 (CO Q 10) 100 MG CAPS Take 100 mg by mouth daily.     [provider]  [employer] VITAMIN C 1000 MG tablet Take 2,000 mg by mouth 2 (two) times daily. 06/13/16   [provider]  diphenhydrAMINE  (BENADRYL) 25 mg capsule Take 1 capsule (25 mg total) by mouth every 6 (six) hours as needed for itching or allergies. 11/07/21   Dorcas Carrow, MD  fexofenadine (ALLEGRA) 180 MG tablet Take 180 mg by mouth daily.    [provider]  Flaxseed, Linseed, 1000 MG CAPS Take 1,000 mg by mouth daily.    [provider]  fluticasone (FLONASE) 50 MCG/ACT nasal spray Place 2 sprays into both nostrils daily as needed for allergies or rhinitis.    [provider]  hydrochlorothiazide 25 MG tablet Take 25 mg by mouth daily.    [provider]  hydrocortisone cream 1 % Apply 1 application topically 4 (four) times daily as needed for itching.    [provider]  ibuprofen (ADVIL) 200 MG tablet Take 200-400 mg by mouth every 6 (six) hours as needed for mild pain.    [provider]  losartan (COZAAR) 100 MG tablet TAKE 1 TABLET BY MOUTH EVERY DAY Patient taking differently: Take 100 mg by mouth daily. 09/14/17   [place], Peter M, MD  magnesium oxide (MAG-OX) 400 MG tablet Take 400 mg by mouth daily.    [provider]  meclizine (ANTIVERT) 25 MG tablet Take 1 tablet (25 mg total) by mouth 3 (three) times daily as needed for dizziness. 05/02/20   Robinson, [place] N, PA-C  methylPREDNISolone (MEDROL DOSEPAK)  4 MG TBPK tablet 6 day dose pack - take as directed 05/08/22   Hyatt, Max T, DPM  metoprolol succinate (TOPROL-XL) 100 MG 24 hr tablet Take 1 tablet (100 mg total) by mouth 2 (two) times daily. Take with or immediately following a meal. Take 100 mg by mouth 2 (two) times daily. Take with or immediately following a meal. Patient taking differently: Take 100 mg by mouth 2 (two) times daily. Take with or immediately following a meal. 07/14/18   [place], Peter M, MD  montelukast (SINGULAIR) 10 MG tablet Take 10 mg by mouth at bedtime.    [provider]  Multiple Vitamins-Minerals (MULTIPLE VITAMINS/WOMENS PO) Take 1 tablet by mouth daily.    [provider]  mupirocin ointment (BACTROBAN) 2 % Apply 1 application topically 3 (three) times daily as needed for rash. 06/20/20   [provider]  pantoprazole (PROTONIX) 20 MG tablet Take 20 mg by mouth daily. 05/21/20   [provider]  Polyethyl Glyc-Propyl Glyc PF (SYSTANE ULTRA PF) 0.4-0.3 % SOLN Place 1 drop into both eyes 4 (four) times daily as needed (Dry eye).    [provider]  polyethylene glycol (MIRALAX / GLYCOLAX) 17 g packet Take 17 g by mouth 2 (two) times daily. Patient taking differently: Take 17 g by mouth daily. 11/30/19   Dorcas Carrow, MD  potassium chloride SA (KLOR-CON M20) 20 MEQ tablet TAKE 1&1/2 TABLETS BY MOUTH DAILY. Patient taking differently: Take 30 mEq by mouth daily. 02/16/20   [place], Peter M, MD  pravastatin (PRAVACHOL) 40 MG tablet Take 40 mg by mouth daily. 05/21/20   [provider]  Probiotic Product (ALIGN PO) Take 1 capsule by mouth 2 (two) times daily.    [provider]  valACYclovir (VALTREX) 500 MG tablet INCREASE TO TAKING 1 TABLET 2 TIMES A DAY FOR 3 DAYS WITH OUTBREAK Patient taking differently: Take 500 mg by mouth daily. 03/25/16   Ria Comment, FNP      Allergies    Hydrocodone, Hydrocodone-acetaminophen, and Tramadol    Review of Systems   Review of Systems  Physical Exam Updated Vital Signs BP (!) 154/70   Pulse 65   Temp 98.1 F (36.7 C) (Oral)   Resp 18   Ht 5\' 3"  (1.6 m)   Wt 68 kg   LMP 02/04/2004   SpO2 91%   BMI 26.57 kg/m  Physical Exam Constitutional:      Comments: Alert nontoxic.  No respiratory distress.  HENT:     Mouth/Throat:     Pharynx: Oropharynx is clear.  Eyes:     Extraocular Movements: Extraocular movements intact.  Cardiovascular:     Rate and Rhythm: Normal rate and regular rhythm.  Pulmonary:     Effort: Pulmonary effort is normal.     Breath sounds: Normal breath sounds.  Abdominal:     Comments: Abdomen mildly distended.  Diffuse discomfort  to palpation epigastrium and central abdomen.  No guarding.  Musculoskeletal:        General: No swelling or tenderness. Normal range of motion.     Right lower leg: No edema.     Left lower leg: No edema.  Skin:    General: Skin is warm and dry.  Neurological:     General: No focal deficit present.     Mental Status: She is oriented to person, place, and time.     Motor: No weakness.     Coordination: Coordination normal.  Gait: Gait normal.  Psychiatric:        Mood and Affect: Mood normal.     ED Results / Procedures / Treatments   Labs (all labs ordered are listed, but only abnormal results are displayed) Labs Reviewed  COMPREHENSIVE METABOLIC PANEL - Abnormal; Notable for the following components:      Result Value   Glucose, Bld 158 (*)    BUN 25 (*)    Total Bilirubin 1.5 (*)    All other components within normal limits  CBC - Abnormal; Notable for the following components:   WBC 15.1 (*)    All other components within normal limits  URINALYSIS, ROUTINE W REFLEX MICROSCOPIC - Abnormal; Notable for the following components:   APPearance TURBID (*)    pH 8.5 (*)    Protein, ur 30 (*)    All other components within normal limits  LIPASE, BLOOD  URINALYSIS, MICROSCOPIC (REFLEX)  LACTIC ACID, PLASMA    EKG None  Radiology CT ABDOMEN PELVIS WO CONTRAST  Result Date: 07/01/2022 CLINICAL DATA:  Bowel obstruction suspected, abdominal pain EXAM: CT ABDOMEN AND PELVIS WITHOUT CONTRAST TECHNIQUE: Multidetector CT imaging of the abdomen and pelvis was performed following the standard protocol without IV contrast. RADIATION DOSE REDUCTION: This exam was performed according to the departmental dose-optimization program which includes automated exposure control, adjustment of the mA and/or kV according to patient size and/or use of iterative reconstruction technique. COMPARISON:  Previous studies including the examination of 11/05/2021 FINDINGS: Lower chest: No acute findings  are seen in the lower lung fields. Hepatobiliary: There are few low-density lesions in liver measuring up to 2.7 cm in diameter suggesting possible cysts. There is no dilation of bile ducts. Gallbladder is not distended. Pancreas: No focal abnormalities are seen. Spleen: Unremarkable. Adrenals/Urinary Tract: There is no hydronephrosis. There are no renal or ureteral stones. Urinary bladder is not distended. Stomach/Bowel: Small hiatal hernia is seen. There is moderate distention of stomach with fluid in the lumen. There is dilation of proximal small bowel loops measuring up to 2.9 cm. Distal and terminal ileum are not dilated. Zone of transition appears to be in right mid to right lower abdomen. No demonstrable focal masses are seen. Appendix is not distinctly seen. There is no pericecal inflammation. Few scattered diverticula are seen in colon without signs of focal acute diverticulitis. Vascular/Lymphatic: Scattered arterial calcifications are seen. Reproductive: Unremarkable. Other: There is no ascites or pneumoperitoneum. Right inguinal hernia containing fat is seen. Musculoskeletal: Degenerative changes are noted in lower thoracic spine and lumbar spine. There is anterolisthesis at the L4-L5 level. There is spinal stenosis at the L4-L5 level. IMPRESSION: There is abnormal dilation of proximal small bowel loops with decompression of distal small bowel loops suggesting partial small bowel obstruction. Zone of transition appears to be in right mid mild right lower abdomen. Findings suggest small bowel obstruction caused by possibly adhesions or internal hernia. There is no hydronephrosis. There is no significant ascites. There is no pneumoperitoneum. There are few low-density lesions in liver, possibly cysts. Diverticulosis of colon without signs of focal diverticulitis. Lumbar spondylosis with spinal stenosis at L4-L5 level. Electronically Signed   By: Ernie Avena M.D.   On: 07/01/2022 12:21     Procedures Procedures    Medications Ordered in ED Medications  HYDROmorphone (DILAUDID) injection 0.5 mg (0.5 mg Intravenous Given 07/01/22 1144)  lactated ringers bolus 1,000 mL (0 mLs Intravenous Stopped 07/01/22 1249)    ED Course/ Medical Decision Making/ A&P  Medical Decision Making Amount and/or Complexity of Data Reviewed Labs: ordered. Radiology: ordered.  Risk Prescription drug management. Decision regarding hospitalization.   Patient presents with sudden onset of abdominal pain.  She has prior history of multiple bowel obstructions.  She reports this is very similar.  Will proceed with CT abdomen pelvis.  Will administer Dilaudid for pain control.  Will initiate fluid resuscitation with lactated Ringer's.  She has leukocytosis 15,000.  Urinalysis negative.  Metabolic panel normal except BUN 25 glucose 158 total bili 1.5.  GFR greater than 60.  CT scan personally reviewed by myself and radiology review.  Early small bowel obstruction.  No free air.  Patient has had recurrent issues with bowel obstruction secondary to adhesions.  At this time she is nontoxic.  She does have leukocytosis however no fever and does not appear to have infectious process.  Patient was given pain medications and started on IV fluids.  At this time we will plan for admission for ongoing treatment.  Patient reports that she never gets an NG tube placed.  At this time patient has not had any vomiting and will hold off on NG tube.  Consult: Reviewed with Dr. Kirby Crigler [facility] hospitalist for admission.       Final Clinical Impression(s) / ED Diagnoses Final diagnoses:  Small bowel obstruction [facility])    Rx / DC Orders ED Discharge Orders     None         Arby Barrette, MD 07/01/22 1414

## 2022-07-01 NOTE — Plan of Care (Signed)
Plan of Care Note for Accepted Transfer   Patient: Cynthia Peters    ZHY:[id number]     Facility requesting transfer: [facility] Requesting Provider: Dr. Donnald Garre  72 year old female with history of hypertension, TIAs, thoracic aneurysm, recurrent small bowel obstructions presented to the ER today with 1 day of abdominal pain, nausea and no vomiting found to have partial small bowel obstruction.  Most recent vitals, labs and radiology:  Blood pressure (!) 168/73, pulse 65, temperature 98.1 F (36.7 C), temperature source Oral, resp. rate 18, height 5\' 3"  (1.6 m), weight 68 kg, last menstrual period 02/04/2004, SpO2 100 %.      Latest Ref Rng & Units 07/01/2022    9:43 AM 05/08/2022    4:10 PM 11/06/2021    9:28 AM  CBC  WBC 4.0 - 10.5 K/uL 15.1  8.0  7.7   Hemoglobin 12.0 - 15.0 g/dL 29.5  28.4  13.2   Hematocrit 36.0 - 46.0 % 41.8  41.3  43.6   Platelets 150 - 400 K/uL 206  220  220       Latest Ref Rng & Units 07/01/2022    9:43 AM 11/06/2021    9:28 AM 11/05/2021    8:43 AM  BMP  Glucose 70 - 99 mg/dL 440  102  725   BUN 8 - 23 mg/dL 25  21  33   Creatinine 0.44 - 1.00 mg/dL 3.66  4.40  3.47   Sodium 135 - 145 mmol/L 137  139  138   Potassium 3.5 - 5.1 mmol/L 4.1  3.5  4.0   Chloride 98 - 111 mmol/L 101  107  102   CO2 22 - 32 mmol/L 25  24  26    Calcium 8.9 - 10.3 mg/dL 9.4  9.3  9.5      CT ABDOMEN PELVIS WO CONTRAST  Result Date: 07/01/2022 CLINICAL DATA:  Bowel obstruction suspected, abdominal pain EXAM: CT ABDOMEN AND PELVIS WITHOUT CONTRAST TECHNIQUE: Multidetector CT imaging of the abdomen and pelvis was performed following the standard protocol without IV contrast. RADIATION DOSE REDUCTION: This exam was performed according to the departmental dose-optimization program which includes automated exposure control, adjustment of the mA and/or kV according to patient size and/or use of iterative reconstruction technique. COMPARISON:  Previous studies including the  examination of 11/05/2021 FINDINGS: Lower chest: No acute findings are seen in the lower lung fields. Hepatobiliary: There are few low-density lesions in liver measuring up to 2.7 cm in diameter suggesting possible cysts. There is no dilation of bile ducts. Gallbladder is not distended. Pancreas: No focal abnormalities are seen. Spleen: Unremarkable. Adrenals/Urinary Tract: There is no hydronephrosis. There are no renal or ureteral stones. Urinary bladder is not distended. Stomach/Bowel: Small hiatal hernia is seen. There is moderate distention of stomach with fluid in the lumen. There is dilation of proximal small bowel loops measuring up to 2.9 cm. Distal and terminal ileum are not dilated. Zone of transition appears to be in right mid to right lower abdomen. No demonstrable focal masses are seen. Appendix is not distinctly seen. There is no pericecal inflammation. Few scattered diverticula are seen in colon without signs of focal acute diverticulitis. Vascular/Lymphatic: Scattered arterial calcifications are seen. Reproductive: Unremarkable. Other: There is no ascites or pneumoperitoneum. Right inguinal hernia containing fat is seen. Musculoskeletal: Degenerative changes are noted in lower thoracic spine and lumbar spine. There is anterolisthesis at the L4-L5 level. There is spinal stenosis at the L4-L5 level. IMPRESSION: There is abnormal dilation  of proximal small bowel loops with decompression of distal small bowel loops suggesting partial small bowel obstruction. Zone of transition appears to be in right mid mild right lower abdomen. Findings suggest small bowel obstruction caused by possibly adhesions or internal hernia. There is no hydronephrosis. There is no significant ascites. There is no pneumoperitoneum. There are few low-density lesions in liver, possibly cysts. Diverticulosis of colon without signs of focal diverticulitis. Lumbar spondylosis with spinal stenosis at L4-L5 level. Electronically Signed    By: Ernie Avena M.D.   On: 07/01/2022 12:21    The patient has been accepted for transfer to [facility] or [facility], depending on bed and resource availability. The patient will remain under the care and responsibility of the referring provider until they have arrived to our inpatient facility.  Author: Simrin Vegh Sharlette Dense, MD  07/01/2022  Check [email or web address] for on-call coverage.  Nursing staff, Please call [facility] Admits & Consults System-Wide number on Amion as soon as patient's arrival, so appropriate admitting provider can evaluate the pt.

## 2022-07-02 DIAGNOSIS — D72829 Elevated white blood cell count, unspecified: Secondary | ICD-10-CM

## 2022-07-02 DIAGNOSIS — K56609 Unspecified intestinal obstruction, unspecified as to partial versus complete obstruction: Secondary | ICD-10-CM | POA: Diagnosis not present

## 2022-07-02 LAB — CBC
HCT: 36.1 % (ref 36.0–46.0)
Hemoglobin: 12.2 g/dL (ref 12.0–15.0)
MCH: 32.5 pg (ref 26.0–34.0)
MCHC: 33.8 g/dL (ref 30.0–36.0)
MCV: 96.3 fL (ref 80.0–100.0)
Platelets: 161 10*3/uL (ref 150–400)
RBC: 3.75 MIL/uL — ABNORMAL LOW (ref 3.87–5.11)
RDW: 12.1 % (ref 11.5–15.5)
WBC: 11 10*3/uL — ABNORMAL HIGH (ref 4.0–10.5)
nRBC: 0 % (ref 0.0–0.2)

## 2022-07-02 LAB — BASIC METABOLIC PANEL
Anion gap: 7 (ref 5–15)
BUN: 17 mg/dL (ref 8–23)
CO2: 23 mmol/L (ref 22–32)
Calcium: 8.4 mg/dL — ABNORMAL LOW (ref 8.9–10.3)
Chloride: 109 mmol/L (ref 98–111)
Creatinine, Ser: 0.73 mg/dL (ref 0.44–1.00)
GFR, Estimated: >60 mL/min (ref >60)
Glucose, Bld: 101 mg/dL — ABNORMAL HIGH (ref 70–99)
Potassium: 3.5 mmol/L (ref 3.5–5.1)
Sodium: 139 mmol/L (ref 135–145)

## 2022-07-02 LAB — HIV ANTIBODY (ROUTINE TESTING W REFLEX): HIV Screen 4th Generation wRfx: NONREACTIVE

## 2022-07-02 NOTE — Plan of Care (Signed)

## 2022-07-02 NOTE — Discharge Instructions (Signed)
Please take all prescribed medications exactly as instructed.  Please stop taking any and all fiber supplements including discontinuing using flaxseed. Please consume a low fiber or low residue diet.  A low fiber eating plan has been attached to your after visit summary for your reference. Please increase your physical activity as tolerated. Please maintain all outpatient follow-up appointments including follow-up with your primary care provider in 1-2 weeks. Please return to the emergency department if you develop worsening abdominal pain, inability to move your bowels or pass gas, fevers in excess of 100.4 F, weakness or inability to tolerate oral intake.

## 2022-07-02 NOTE — Progress Notes (Signed)
Patient was given discharge instructions. All questions were answered. Transported via wheelchair to main entrance. 

## 2022-07-02 NOTE — Progress Notes (Signed)
Mobility Specialist - Progress Note   07/02/22 1142  Mobility  Activity Ambulated independently in hallway  Level of Assistance Independent  Assistive Device None  Distance Ambulated (ft) 500 ft  Activity Response Tolerated well  Mobility Referral Yes  $Mobility charge 1 Mobility  Mobility Specialist Start Time (ACUTE ONLY) 1138  Mobility Specialist Stop Time (ACUTE ONLY) 1140  Mobility Specialist Time Calculation (min) (ACUTE ONLY) 2 min   Pt received in room standing and agreeable to mobility. No complaints during session. Pt to EOB after session with all needs met.    [facility]

## 2022-07-02 NOTE — Discharge Summary (Addendum)
Physician Discharge Summary   Patient: Cynthia Peters MRN: [id number] DOB: 03/01/1950  Admit date:     07/01/2022  Discharge date: 07/02/22  Discharge Physician: Adie Vilar J Willowdean Luhmann   PCP: Kaplan, Kristen W., PA-C   Recommendations at discharge:   Please take all prescribed medications exactly as instructed.  Please stop taking any and all fiber supplements including discontinuing using flaxseed. Please consume a low fiber or low residue diet.  A low fiber eating plan has been attached to your after visit summary for your reference. Please increase your physical activity as tolerated. Please maintain all outpatient follow-up appointments including follow-up with your primary care provider in 1-2 weeks. Please return to the emergency department if you develop worsening abdominal pain, inability to move your bowels or pass gas, fevers in excess of 100.4 F, weakness or inability to tolerate oral intake.  Discharge Diagnoses: Principal Problem:   SBO (small bowel obstruction) (HCC) - recurrent Active Problems:   Hypertension   Mixed hyperlipidemia   Leukocytosis  Resolved Problems:   * No resolved hospital problems. *   Hospital Course: 71-year-old female with past medical history of gastroesophageal reflux disease, hernia repair with recurrent partial small bowel obstructions presenting to [facility] emergency department with complaints of abdominal pain.  Upon evaluation in the emergency department CT imaging revealed small bowel dilation indicating possible small bowel obstruction.  The hospitalist group was then called to assess the patient for admission to the hospital.  Patient was made n.p.o. and placed on intravenous fluids.  Case was discussed with general surgery on 5/28 who recommended formal consultation of general surgery on 5/29 if patient was not clinically improving.  Patient rapidly clinically improved shortly after hospitalization with resumption of  passing flatus and tolerating oral intake as well as resolution of associated abdominal pain.  Patient was discharged home in improved and stable condition on 07/02/2022.  At time of discharge patient was thoroughly educated on needing to adhere to a low residue diet and was additionally provided with literature so that she may adhere to this dietary regimen.  Consultants: None Procedures performed: None  Disposition: Home Diet recommendation:  Discharge Diet Orders (From admission, onward)     Start     Ordered   07/02/22 0000  Diet - low sodium heart healthy        07/02/22 1227           Cardiac diet  DISCHARGE MEDICATION: Allergies as of 07/02/2022       Reactions   Mushroom Extract Complex Other (See Comments)   Tested allergic   Other Other (See Comments), Cough   Smoke = Irritates the eyes and causes cold-like symptoms also   Hydrocodone-acetaminophen Rash   Tramadol Hives, Palpitations        Medication List     STOP taking these medications    Advil 200 MG tablet Generic drug: ibuprofen   Flaxseed (Linseed) 1000 MG Caps   methylPREDNISolone 4 MG Tbpk tablet Commonly known as: MEDROL DOSEPAK       TAKE these medications    acetaminophen 500 MG tablet Commonly known as: TYLENOL Take 500-1,000 mg by mouth every 6 (six) hours as needed (for pain).   albuterol 108 (90 Base) MCG/ACT inhaler Commonly known as: VENTOLIN HFA Inhale 2 puffs into the lungs every 6 (six) hours as needed for wheezing or shortness of breath.   Align 4 MG Caps Take 4 mg by mouth See admin instructions. Take 4 mg by   mouth once a day WHEN NOT TAKING FLORASTOR- hold for loose stools   amLODipine 5 MG tablet Commonly known as: NORVASC Take 5 mg by mouth daily.   buPROPion 300 MG 24 hr tablet Commonly known as: WELLBUTRIN XL Take 300 mg by mouth daily.   clobetasol cream 0.05 % Commonly known as: TEMOVATE Apply 1 application  topically 2 (two) times daily as needed (for  rashes).   Co Q 10 100 MG Caps Take 100 mg by mouth daily.   diphenhydrAMINE 25 mg capsule Commonly known as: BENADRYL Take 1 capsule (25 mg total) by mouth every 6 (six) hours as needed for itching or allergies. What changed: reasons to take this   estradiol 0.1 MG/GM vaginal cream Commonly known as: ESTRACE See admin instructions. Apply a 0.5 inch ribbon to the affected area 2 times a week as needed/as directed   fexofenadine 180 MG tablet Commonly known as: ALLEGRA Take 180 mg by mouth daily as needed for allergies or rhinitis.   Florastor 250 MG capsule Generic drug: saccharomyces boulardii Take 250 mg by mouth See admin instructions. Take 250 mg by mouth once a day WHEN NOT TAKING ALIGN- hold for loose stools   fluticasone 50 MCG/ACT nasal spray Commonly known as: FLONASE Place 1 spray into both nostrils in the morning and at bedtime.   hydrochlorothiazide 25 MG tablet Commonly known as: HYDRODIURIL Take 25 mg by mouth daily.   hydrocortisone cream 1 % Apply 1 application topically 4 (four) times daily as needed for itching.   losartan 100 MG tablet Commonly known as: COZAAR TAKE 1 TABLET BY MOUTH EVERY DAY   magnesium oxide 400 MG tablet Commonly known as: MAG-OX Take 400 mg by mouth daily.   meclizine 25 MG tablet Commonly known as: ANTIVERT Take 1 tablet (25 mg total) by mouth 3 (three) times daily as needed for dizziness.   metoprolol succinate 100 MG 24 hr tablet Commonly known as: TOPROL-XL Take 1 tablet (100 mg total) by mouth 2 (two) times daily. Take with or immediately following a meal. Take 100 mg by mouth 2 (two) times daily. Take with or immediately following a meal. What changed:  how much to take when to take this additional instructions   montelukast 10 MG tablet Commonly known as: SINGULAIR Take 10 mg by mouth at bedtime.   MULTIPLE VITAMINS/WOMENS PO Take 1 tablet by mouth daily with breakfast.   mupirocin ointment 2 % Commonly known  as: BACTROBAN Apply 1 application  topically 3 (three) times daily as needed (for rashes).   nystatin powder Generic drug: nystatin Apply 1 Application topically 4 (four) times daily as needed (for irritation- affected areas).   pantoprazole 20 MG tablet Commonly known as: PROTONIX Take 20 mg by mouth daily.   polyethylene glycol 17 g packet Commonly known as: MIRALAX / GLYCOLAX Take 17 g by mouth 2 (two) times daily. What changed:  when to take this reasons to take this   potassium chloride SA 20 MEQ tablet Commonly known as: Klor-Con M20 TAKE 1&1/2 TABLETS BY MOUTH DAILY. What changed:  how much to take how to take this when to take this additional instructions   pravastatin 40 MG tablet Commonly known as: PRAVACHOL Take 40 mg by mouth daily.   Systane Ultra PF 0.4-0.3 % Soln Generic drug: Polyethyl Glyc-Propyl Glyc PF Place 1 drop into both eyes 6 (six) times daily.   valACYclovir 500 MG tablet Commonly known as: VALTREX INCREASE TO TAKING 1 TABLET 2 TIMES   A DAY FOR 3 DAYS WITH OUTBREAK What changed: See the new instructions.   Vitamin C Chew Chew 1 tablet by mouth daily.   Vitamin D3 Super Strength 50 MCG (2000 UT) Caps Generic drug: Cholecalciferol Take 2,000 Units by mouth daily.        Follow-up Information     Kaplan, Kristen W., PA-C. Schedule an appointment as soon as possible for a visit in 1 week(s).   Specialty: Family Medicine Contact information: [address] [place] [address] [phone]                 Discharge Exam: Filed Weights   07/01/22 0922  Weight: 68 kg    Constitutional: Awake alert and oriented x3, no associated distress.   Respiratory: clear to auscultation bilaterally, no wheezing, no crackles. Normal respiratory effort. No accessory muscle use.  Cardiovascular: Regular rate and rhythm, no murmurs / rubs / gallops. No extremity edema. 2+ pedal pulses. No carotid bruits.  Abdomen: Abdomen is soft and  nontender.  No evidence of intra-abdominal masses.  Positive bowel sounds noted in all quadrants.   Musculoskeletal: No joint deformity upper and lower extremities. Good ROM, no contractures. Normal muscle tone.     Condition at discharge: fair  The results of significant diagnostics from this hospitalization (including imaging, microbiology, ancillary and laboratory) are listed below for reference.   Imaging Studies: CT ABDOMEN PELVIS WO CONTRAST  Result Date: 07/01/2022 CLINICAL DATA:  Bowel obstruction suspected, abdominal pain EXAM: CT ABDOMEN AND PELVIS WITHOUT CONTRAST TECHNIQUE: Multidetector CT imaging of the abdomen and pelvis was performed following the standard protocol without IV contrast. RADIATION DOSE REDUCTION: This exam was performed according to the departmental dose-optimization program which includes automated exposure control, adjustment of the mA and/or kV according to patient size and/or use of iterative reconstruction technique. COMPARISON:  Previous studies including the examination of 11/05/2021 FINDINGS: Lower chest: No acute findings are seen in the lower lung fields. Hepatobiliary: There are few low-density lesions in liver measuring up to 2.7 cm in diameter suggesting possible cysts. There is no dilation of bile ducts. Gallbladder is not distended. Pancreas: No focal abnormalities are seen. Spleen: Unremarkable. Adrenals/Urinary Tract: There is no hydronephrosis. There are no renal or ureteral stones. Urinary bladder is not distended. Stomach/Bowel: Small hiatal hernia is seen. There is moderate distention of stomach with fluid in the lumen. There is dilation of proximal small bowel loops measuring up to 2.9 cm. Distal and terminal ileum are not dilated. Zone of transition appears to be in right mid to right lower abdomen. No demonstrable focal masses are seen. Appendix is not distinctly seen. There is no pericecal inflammation. Few scattered diverticula are seen in colon  without signs of focal acute diverticulitis. Vascular/Lymphatic: Scattered arterial calcifications are seen. Reproductive: Unremarkable. Other: There is no ascites or pneumoperitoneum. Right inguinal hernia containing fat is seen. Musculoskeletal: Degenerative changes are noted in lower thoracic spine and lumbar spine. There is anterolisthesis at the L4-L5 level. There is spinal stenosis at the L4-L5 level. IMPRESSION: There is abnormal dilation of proximal small bowel loops with decompression of distal small bowel loops suggesting partial small bowel obstruction. Zone of transition appears to be in right mid mild right lower abdomen. Findings suggest small bowel obstruction caused by possibly adhesions or internal hernia. There is no hydronephrosis. There is no significant ascites. There is no pneumoperitoneum. There are few low-density lesions in liver, possibly cysts. Diverticulosis of colon without signs of focal diverticulitis. Lumbar spondylosis   with spinal stenosis at L4-L5 level. Electronically Signed   By: Palani  Rathinasamy M.D.   On: 07/01/2022 12:21    Microbiology: Results for orders placed or performed during the hospital encounter of 11/05/21  SARS Coronavirus 2 by RT PCR (hospital order, performed in [place] hospital lab) *cepheid single result test* Anterior Nasal Swab     Status: None   Collection Time: 11/05/21  8:43 AM   Specimen: Anterior Nasal Swab  Result Value Ref Range Status   SARS Coronavirus 2 by RT PCR NEGATIVE NEGATIVE Final    Comment: (NOTE) SARS-CoV-2 target nucleic acids are NOT DETECTED.  The SARS-CoV-2 RNA is generally detectable in upper and lower respiratory specimens during the acute phase of infection. The lowest concentration of SARS-CoV-2 viral copies this assay can detect is 250 copies / mL. A negative result does not preclude SARS-CoV-2 infection and should not be used as the sole basis for treatment or other patient management decisions.  A negative  result may occur with improper specimen collection / handling, submission of specimen other than nasopharyngeal swab, presence of viral mutation(s) within the areas targeted by this assay, and inadequate number of viral copies (<250 copies / mL). A negative result must be combined with clinical observations, patient history, and epidemiological information.  Fact Sheet for Patients:   https://www.fda.gov/media/158405/download  Fact Sheet for Healthcare Providers: https://www.fda.gov/media/158404/download  This test is not yet approved or  cleared by the [place] FDA and has been authorized for detection and/or diagnosis of SARS-CoV-2 by FDA under an Emergency Use Authorization (EUA).  This EUA will remain in effect (meaning this test can be used) for the duration of the COVID-19 declaration under Section 564(b)(1) of the Act, 21 U.S.C. section 360bbb-3(b)(1), unless the authorization is terminated or revoked sooner.  Performed at [facility] [place], [address] [place], [place] [address]     Labs: CBC: Recent Labs  Lab 07/01/22 0943 07/02/22 0456  WBC 15.1* 11.0*  HGB 14.9 12.2  HCT 41.8 36.1  MCV 91.5 96.3  PLT 206 161   Basic Metabolic Panel: Recent Labs  Lab 07/01/22 0943 07/02/22 0456  NA 137 139  K 4.1 3.5  CL 101 109  CO2 25 23  GLUCOSE 158* 101*  BUN 25* 17  CREATININE 0.84 0.73  CALCIUM 9.4 8.4*   Liver Function Tests: Recent Labs  Lab 07/01/22 0943  AST 29  ALT 21  ALKPHOS 67  BILITOT 1.5*  PROT 7.3  ALBUMIN 4.4   CBG: No results for input(s): "GLUCAP" in the last 168 hours.  Discharge time spent: greater than 30 minutes.  Signed: Zaelyn Barbary J Myrtice Lowdermilk, MD [facility] 07/02/2022         

## 2022-07-02 NOTE — Hospital Course (Addendum)
72 year old female with past medical history of gastroesophageal reflux disease, hernia repair with recurrent partial small bowel obstructions presenting to [facility] emergency department with complaints of abdominal pain.  Upon evaluation in the emergency department CT imaging revealed small bowel dilation indicating possible small bowel obstruction.  The hospitalist group was then called to assess the patient for admission to the hospital.  Patient was made n.p.o. and placed on intravenous fluids.  Case was discussed with general surgery on 5/28 who recommended formal consultation of general surgery on 5/29 if patient was not clinically improving.  Patient rapidly clinically improved shortly after hospitalization with resumption of passing flatus and tolerating oral intake as well as resolution of associated abdominal pain.  Patient was discharged home in improved and stable condition on 07/02/2022.  At time of discharge patient was thoroughly educated on needing to adhere to a low residue diet and was additionally provided with literature so that she may adhere to this dietary regimen.

## 2022-07-02 NOTE — TOC CM/SW Note (Signed)
Transition of Care [facility]) - Inpatient Brief Assessment  Patient Details  Name: Cynthia Peters MRN: [id number] Date of Birth: 1950/06/01  Transition of Care [facility]) CM/SW Contact:    Ewing Schlein, LCSW Phone Number: 07/02/2022, 10:09 AM  Clinical Narrative: TOC brief assessment completed.  Transition of Care Asessment: Insurance and Status: Insurance coverage has been reviewed Patient has primary care physician: Yes Home environment has been reviewed: Yes Prior level of function:: Independent at baseline Prior/Current Home Services: No current home services Social Determinants of Health Reivew: SDOH reviewed no interventions necessary Readmission risk has been reviewed: Yes Transition of care needs: no transition of care needs at this time

## 2022-07-02 NOTE — Progress Notes (Signed)
72 year old female admitted for small bowel obstruction. T: 98, HR: 57, BP: 138/58, RR: 14, O2:100% via RA. Pain 0-10. Patient alert and oriented x 4. Speech is clear. PERRLA. S1 S2 heard. No murmurs. Regular rate and rhythm. No JVD. Lung sounds heard and clear. No adventitious sounds. Unlabored and symmetrical. Abdomen is soft. Bowel sounds heard in all quadrants. Patient reports abdominal tenderness at RUQ. Walks independently. Patient was discharged.  Mel Almond, Student RN

## 2022-10-02 ENCOUNTER — Encounter: Payer: Self-pay | Admitting: Internal Medicine

## 2022-10-02 ENCOUNTER — Ambulatory Visit: Payer: Medicare PPO | Attending: Internal Medicine | Admitting: Internal Medicine

## 2022-10-02 VITALS — BP 109/67 | HR 58 | Resp 16 | Ht 63.0 in | Wt 152.0 lb

## 2022-10-02 DIAGNOSIS — M159 Polyosteoarthritis, unspecified: Secondary | ICD-10-CM | POA: Diagnosis not present

## 2022-10-02 DIAGNOSIS — R768 Other specified abnormal immunological findings in serum: Secondary | ICD-10-CM | POA: Diagnosis not present

## 2022-10-02 NOTE — Patient Instructions (Signed)

## 2022-10-02 NOTE — Progress Notes (Signed)
Office Visit Note  Patient: Cynthia Peters             Date of Birth: Dec 25, 1950           MRN: [id number]             PCP: Richmond Campbell., PA-C Referring: Elinor Parkinson, [place] Visit Date: 10/02/2022  Subjective:  New Patient (Initial Visit) (Abnormal labs)   History of Present Illness: Cynthia Peters is a 72 y.o. female here for evaluation of positive ANA associated with capsulitis.  She was evaluated for pain and swelling at left great toe this started acutely at the beginning of March this year.  This was not preceded by any specific injury activity or medication change.  Podiatry clinic assessment had concern for possible gout as attack was self-limited but developed similar pain again in April so they drew lab tests.  Uric acid was normal at 4.9 and serum inflammatory markers normal but had positive ANA.  During the interval before her visit she was hospitalized for small bowel obstruction in May.  She has had a few intermittent obstructions with previous history of hernia repair.  Currently symptoms are improved and does not have significant persistent pain or swelling in the toe.  She is not taking any medication for pain right now. She does have eye and mouth dryness this is chronic longstanding before the past year.  She denies any new skin rashes, palpable lymph nodes, Raynaud's symptoms, or abnormal bruising or bleeding.  Labs reviewed 05/2022 ANA 1:80 fine speckled, homogenous RF neg ESR 9 CRP 5.2   Activities of Daily Living:  Patient reports morning stiffness for 30 minutes.   Patient Denies nocturnal pain.  Difficulty dressing/grooming: Denies Difficulty climbing stairs: Reports Difficulty getting out of chair: Reports Difficulty using hands for taps, buttons, cutlery, and/or writing: Denies  Review of Systems  Constitutional:  Negative for fatigue.  HENT:  Positive for mouth dryness. Negative for mouth sores.   Eyes:  Positive for dryness.   Respiratory:  Negative for shortness of breath.   Cardiovascular:  Positive for palpitations. Negative for chest pain.  Gastrointestinal:  Positive for constipation and diarrhea. Negative for blood in stool.  Endocrine: Positive for increased urination.  Genitourinary:  Positive for involuntary urination.  Musculoskeletal:  Positive for joint pain, joint pain and morning stiffness. Negative for gait problem, joint swelling, myalgias, muscle weakness, muscle tenderness and myalgias.  Skin:  Positive for hair loss. Negative for color change, rash and sensitivity to sunlight.  Allergic/Immunologic: Negative for susceptible to infections.  Neurological:  Positive for dizziness. Negative for headaches.  Hematological:  Negative for swollen glands.  Psychiatric/Behavioral:  Positive for sleep disturbance. Negative for depressed mood. The patient is nervous/anxious.     PMFS History:  Patient Active Problem List   Diagnosis Date Noted   Generalized osteoarthritis of multiple sites 10/02/2022   Positive ANA (antinuclear antibody) 10/02/2022   Leukocytosis 07/02/2022   Aortic atherosclerosis (HCC) 06/25/2021   Osteoarthritis of elbow 06/29/2020   Depression, major, in remission (HCC) 05/21/2020   Asymptomatic bacteriuria 11/27/2019   History of TIA (transient ischemic attack) 11/27/2019   Thoracic aortic aneurysm (HCC) 11/27/2019   Cubital tunnel syndrome 06/09/2019   Osteoarthritis of carpometacarpal (CMC) joint of thumb 12/16/2017   Pain of left thumb 12/16/2017   History of transient ischemic attack (TIA) 07/16/2017   GERD (gastroesophageal reflux disease) 07/16/2017   Arthralgia of right elbow 07/08/2017   Benign neoplasm of tonsil  06/24/2017   SBO (small bowel obstruction) (HCC) - recurrent 05/29/2017   Bilateral leg edema 02/19/2017   Neck pain 02/19/2017   Vitamin D deficiency 02/19/2017   Hypertension 05/30/2015   Mixed hyperlipidemia 05/30/2015   Recurrent ventral incisional  hernia s/p lap repairs 2013, 2016, 2017 10/25/2014   Anxiety 10/20/2014   Irritable bowel syndrome 10/20/2014   Interstitial cystitis 03/15/2013   Lichen planus 03/15/2013   Palpitations    PVC's (premature ventricular contractions)     Past Medical History:  Diagnosis Date   Anemia    history in the past, not on a regular basis   Arthritis    Bronchitis    Cataracts, bilateral    immature   Claustrophobia    takes Valium daily as needed   Complication of anesthesia    2013 SURGERY vocal cord bothering pt, used smaller ent tube after 2 hernia repair, no problem after . " I woke up during my MRI and colonoscopy."        Depression    takes Wellbutrin daily   Dry eye    Dry mouth    GERD (gastroesophageal reflux disease)    takes Omeprazole daily    H/O hiatal hernia    Headache    migraines with flashing lights    History of blood clots 40 yrs    leg    History of echocardiogram 2013   a. Echo (05/2011):  Mild LVH, EF 65-70%, no WMA, Gr 1 DD, mild AI.   History of MRSA infection    History of staph infection    History of stress test 2010   a. ETT-Echo in 2010 was normal   History of TIA (transient ischemic attack) 2013   a. Carotid US (05/2011):  No ICA stenosis, pt unsure of this   Hyperlipidemia    takes Simvastatin daily    Hypertension    takes  Losartan daily   IBS (irritable bowel syndrome)    takes Bentyl daily as needed   Insomnia    takes Ambien nightly   Interstitial cystitis    Lichen planus    Mild asthma    Albuterol inhaler as needed   Palpitations    Event monitor in 2007 with rare PVCs // takes Metoprolol daily   Partial small bowel obstruction (HCC) 07/16/2017   PVC (premature ventricular contraction)    Right radial head fracture 06/17/2016   Seasonal allergies    takes Claritin daily as needed.Takes Singulair daily as needed.   TIA (transient ischemic attack) 05/27/2011   Urethral stricture    Vertigo    takes Meclizine daily as needed     Family History  Problem Relation Age of Onset   Dementia Mother    Cirrhosis Father    Dementia Sister    Leukemia Child    Past Surgical History:  Procedure Laterality Date   ARTHROTOMY Right 09/21/2020   Procedure: Right elbow open arthrotomy and synovectomy with radial head implant removal and repair reconstruction as necessary including ligamentous repair;  Surgeon: Dominica Severin, MD;  Location: [facility] OR;  Service: Orthopedics;  Laterality: Right;    CESAREAN SECTION     COLONOSCOPY WITH PROPOFOL N/A 08/10/2012   Procedure: COLONOSCOPY WITH PROPOFOL;  Surgeon: Charolett Bumpers, MD;  Location: [facility];  Service: Endoscopy;  Laterality: N/A;   CYSTOSCOPY WITH URETHRAL DILATATION N/A 02/18/2013   Procedure: CYSTOSCOPY WITH URETHRAL DILATATION ( NO BALLOON) AND HYDRODISTENSION;  Surgeon: Sebastian Ache, MD;  Location: [facility];  Service: Urology;  Laterality: N/A;   ELBOW SURGERY     ENDOMETRIAL ABLATION W/ HYDROTHERMABLATOR  09/27/2002   HERNIA REPAIR     INCISIONAL HERNIA REPAIR N/A 10/25/2014   Procedure: HERNIA REPAIR INCISIONAL;  Surgeon: Axel Filler, MD;  Location: [facility];  Service: General;  Laterality: N/A;   INCISIONAL HERNIA REPAIR N/A 03/26/2015   Procedure: LAPAROSCOPIC INCISIONAL HERNIA;  Surgeon: Axel Filler, MD;  Location: [facility] OR;  Service: General;  Laterality: N/A;   INSERTION OF MESH N/A 10/25/2014   Procedure: INSERTION OF MESH;  Surgeon: Axel Filler, MD;  Location: [facility] OR;  Service: General;  Laterality: N/A;   LAPAROSCOPIC LYSIS OF ADHESIONS N/A 03/26/2015   Procedure: LAPAROSCOPIC LYSIS OF ADHESIONS;  Surgeon: Axel Filler, MD;  Location: [facility];  Service: General;  Laterality: N/A;   LAPAROTOMY N/A 10/25/2014   Procedure: EXPLORATORY LAPAROTOMY;  Surgeon: Axel Filler, MD;  Location: [facility];  Service: General;  Laterality: N/A;   LYSIS OF ADHESION N/A 10/25/2014   Procedure: LYSIS OF ADHESIONS ;  Surgeon: Axel Filler, MD;  Location: [facility];  Service: General;  Laterality: N/A;   MUCOSAL ADVANCEMENT FLAP N/A 10/25/2014   Procedure: MUCOSAL ADVANCEMENT FLAP;  Surgeon: Axel Filler, MD;  Location: [facility];  Service: General;  Laterality: N/A;   ORIF RADIAL FRACTURE Right 06/17/2016   Procedure: right radial head arthroplasty with ligament repair, reconstruciton;  Surgeon: Dominica Severin, MD;  Location: [facility];  Service: Orthopedics;  Laterality: Right;   RADIAL HEAD ARTHROPLASTY Right 06/17/2016   TOOTH EXTRACTION     TRANSTHORACIC ECHOCARDIOGRAM  05/26/2011   MILD LVH/  EF 65-70%/  GRADE I DIASTOLIC DYSFUNCTION/  MILD AR//   06-20-2008  NOMRAL STRESS ECHO   UMBILICAL HERNIA REPAIR  2013   AND REVISION THE SAME YEAR W/ MESH   WISDOM TOOTH EXTRACTION  AGE 46   Social History   Social History Narrative   Not on file   Immunization History  Administered Date(s) Administered   COVID-19, mRNA, vaccine(Comirnaty)12 years and older 11/21/2021   Covid-19, Mrna,Vaccine(Spikevax)25yrs and older 05/19/2022   Fluad Quad(high Dose 65+) 11/29/2019   Influenza, High Dose Seasonal PF 12/23/2017, 11/17/2018   Influenza,inj,Quad PF,6+ Mos 12/12/2016   Influenza,inj,quad, With Preservative 12/12/2016, 11/03/2017   Influenza-Unspecified 03/09/2016, 12/12/2016, 12/12/2016, 11/03/2017   PFIZER Comirnaty(Gray Top)Covid-19 Tri-Sucrose Vaccine 05/14/2020   PFIZER(Purple Top)SARS-COV-2 Vaccination 02/26/2019, 03/19/2019, 10/31/2019   Pfizer Covid-19 Vaccine Bivalent Booster 22yrs & up 12/12/2020, 05/30/2021   Pneumococcal Conjugate-13 04/02/2017, 04/02/2017   Pneumococcal-Unspecified 10/05/2014   Tdap 11/19/2010     Objective: Vital Signs: BP 109/67 (BP Location: Left Arm, Patient Position: Sitting, Cuff Size: Normal)   Pulse (!) 58   Resp 16   Ht 5\' 3"  (1.6 m)   Wt 152 lb (68.9 kg)   LMP 02/04/2004   BMI 26.93 kg/m    Physical Exam Eyes:     Conjunctiva/sclera: Conjunctivae normal.  Cardiovascular:      Rate and Rhythm: Normal rate and regular rhythm.  Pulmonary:     Effort: Pulmonary effort is normal.     Breath sounds: Normal breath sounds.  Musculoskeletal:     Right lower leg: No edema.     Left lower leg: No edema.  Lymphadenopathy:     Cervical: No cervical adenopathy.  Skin:    General: Skin is warm and dry.     Findings: No rash.  Neurological:     Mental Status: She is alert.  Psychiatric:  Mood and Affect: Mood normal.      Musculoskeletal Exam:  Elbows full ROM no tenderness or swelling Wrists full ROM no tenderness or swelling Fingers full ROM heberdon's nodes in both hands, no palpable swelling Knees full ROM no tenderness or swelling Ankles full ROM no tenderness or swelling MTPs full ROM no tenderness or swelling   Investigation: No additional findings.  Imaging: No results found.  Recent Labs: Lab Results  Component Value Date   WBC 11.0 (H) 07/02/2022   HGB 12.2 07/02/2022   PLT 161 07/02/2022   NA 139 07/02/2022   K 3.5 07/02/2022   CL 109 07/02/2022   CO2 23 07/02/2022   GLUCOSE 101 (H) 07/02/2022   BUN 17 07/02/2022   CREATININE 0.73 07/02/2022   BILITOT 1.5 (H) 07/01/2022   ALKPHOS 67 07/01/2022   AST 29 07/01/2022   ALT 21 07/01/2022   PROT 7.3 07/01/2022   ALBUMIN 4.4 07/01/2022   CALCIUM 8.4 (L) 07/02/2022   GFRAA >60 11/17/2018    Speciality Comments: No specialty comments available.  Procedures:  No procedures performed Allergies: Mushroom extract complex, Other, Hydrocodone-acetaminophen, and Tramadol   Assessment / Plan:     Visit Diagnoses: Positive ANA (antinuclear antibody) - Plan: RNP Antibody, Anti-Smith antibody, Sjogrens syndrome-A extractable nuclear antibody, Sjogrens syndrome-B extractable nuclear antibody, Anti-DNA antibody, double-stranded, C3 and C4, Mutated Citrullinated Vimentin (MCV) Antibody  Positive ANA at low titer and episode of left toe inflammatory arthritis earlier this year but currently  asymptomatic.  No other specific criteria for systemic autoimmune disease appreciable.  Will check antibody panel today for further evaluation and serum complements.  I have a low pretest suspicion for a systemic disease so would not need scheduled follow-up for monitoring unless abnormal results are confirmed.  Generalized osteoarthritis of multiple sites  Does not have extensive joint widening or decreased mobility in the great toe where her complaint was.  However does demonstrate generalized osteoarthritis changes including Heberden's nodes of the hands.  Provided computer and handout information on osteoarthritis symptom management.  Orders: Orders Placed This Encounter  Procedures   RNP Antibody   Anti-Smith antibody   Sjogrens syndrome-A extractable nuclear antibody   Sjogrens syndrome-B extractable nuclear antibody   Anti-DNA antibody, double-stranded   C3 and C4   Mutated Citrullinated Vimentin (MCV) Antibody   No orders of the defined types were placed in this encounter.   Follow-Up Instructions: Return if symptoms worsen or fail to improve.   Fuller Plan, MD  Note - This record has been created using [employer].  Chart creation errors have been sought, but may not always  have been located. Such creation errors do not reflect on  the standard of medical care.

## 2022-10-08 LAB — ANTI-SMITH ANTIBODY: ENA SM Ab Ser-aCnc: <1 AI

## 2022-10-08 LAB — SJOGRENS SYNDROME-B EXTRACTABLE NUCLEAR ANTIBODY: SSB (La) (ENA) Antibody, IgG: <1 AI

## 2022-10-08 LAB — RNP ANTIBODY: Ribonucleic Protein(ENA) Antibody, IgG: <1 AI

## 2022-10-08 LAB — C3 AND C4
C3 Complement: 101 mg/dL (ref 83–193)
C4 Complement: 17 mg/dL (ref 15–57)

## 2022-10-08 LAB — SJOGRENS SYNDROME-A EXTRACTABLE NUCLEAR ANTIBODY: SSA (Ro) (ENA) Antibody, IgG: <1 AI

## 2022-10-08 LAB — MUTATED CITRULLINATED VIMENTIN (MCV) ANTIBODY: MUTATED CITRULLINATED VIMENTIN (MCV) AB: <20 U/mL (ref <20)

## 2022-10-08 LAB — ANTI-DNA ANTIBODY, DOUBLE-STRANDED: ds DNA Ab: <1 [IU]/mL

## 2022-11-16 ENCOUNTER — Emergency Department (HOSPITAL_BASED_OUTPATIENT_CLINIC_OR_DEPARTMENT_OTHER): Payer: Medicare PPO

## 2022-11-16 ENCOUNTER — Encounter (HOSPITAL_BASED_OUTPATIENT_CLINIC_OR_DEPARTMENT_OTHER): Payer: Self-pay | Admitting: Emergency Medicine

## 2022-11-16 ENCOUNTER — Emergency Department (HOSPITAL_BASED_OUTPATIENT_CLINIC_OR_DEPARTMENT_OTHER)
Admission: EM | Admit: 2022-11-16 | Discharge: 2022-11-16 | Disposition: A | Discharge status: Home / Self Care | Payer: Medicare PPO | Attending: Emergency Medicine | Admitting: Emergency Medicine

## 2022-11-16 DIAGNOSIS — N3000 Acute cystitis without hematuria: Secondary | ICD-10-CM | POA: Diagnosis not present

## 2022-11-16 DIAGNOSIS — R443 Hallucinations, unspecified: Secondary | ICD-10-CM | POA: Diagnosis present

## 2022-11-16 DIAGNOSIS — Z79899 Other long term (current) drug therapy: Secondary | ICD-10-CM | POA: Diagnosis not present

## 2022-11-16 DIAGNOSIS — I10 Essential (primary) hypertension: Secondary | ICD-10-CM | POA: Insufficient documentation

## 2022-11-16 LAB — COMPREHENSIVE METABOLIC PANEL
ALT: 20 U/L (ref 0–44)
AST: 22 U/L (ref 15–41)
Albumin: 4.5 g/dL (ref 3.5–5.0)
Alkaline Phosphatase: 57 U/L (ref 38–126)
Anion gap: 11 (ref 5–15)
BUN: 31 mg/dL — ABNORMAL HIGH (ref 8–23)
CO2: 25 mmol/L (ref 22–32)
Calcium: 9.6 mg/dL (ref 8.9–10.3)
Chloride: 101 mmol/L (ref 98–111)
Creatinine, Ser: 1.12 mg/dL — ABNORMAL HIGH (ref 0.44–1.00)
GFR, Estimated: 53 mL/min — ABNORMAL LOW (ref >60)
Glucose, Bld: 117 mg/dL — ABNORMAL HIGH (ref 70–99)
Potassium: 4 mmol/L (ref 3.5–5.1)
Sodium: 137 mmol/L (ref 135–145)
Total Bilirubin: 1.7 mg/dL — ABNORMAL HIGH (ref 0.3–1.2)
Total Protein: 7.2 g/dL (ref 6.5–8.1)

## 2022-11-16 LAB — URINALYSIS, W/ REFLEX TO CULTURE (INFECTION SUSPECTED)
Bilirubin Urine: NEGATIVE
Glucose, UA: NEGATIVE mg/dL
Hgb urine dipstick: NEGATIVE
Ketones, ur: NEGATIVE mg/dL
Nitrite: NEGATIVE
Protein, ur: NEGATIVE mg/dL
Specific Gravity, Urine: 1.015 (ref 1.005–1.030)
pH: 6.5 (ref 5.0–8.0)

## 2022-11-16 LAB — CBC WITH DIFFERENTIAL/PLATELET
Abs Immature Granulocytes: 0.02 10*3/uL (ref 0.00–0.07)
Basophils Absolute: 0.1 10*3/uL (ref 0.0–0.1)
Basophils Relative: 1 %
Eosinophils Absolute: 0.4 10*3/uL (ref 0.0–0.5)
Eosinophils Relative: 5 %
HCT: 40.7 % (ref 36.0–46.0)
Hemoglobin: 14.1 g/dL (ref 12.0–15.0)
Immature Granulocytes: 0 %
Lymphocytes Relative: 15 %
Lymphs Abs: 1.3 10*3/uL (ref 0.7–4.0)
MCH: 32.3 pg (ref 26.0–34.0)
MCHC: 34.6 g/dL (ref 30.0–36.0)
MCV: 93.3 fL (ref 80.0–100.0)
Monocytes Absolute: 0.9 10*3/uL (ref 0.1–1.0)
Monocytes Relative: 10 %
Neutro Abs: 5.8 10*3/uL (ref 1.7–7.7)
Neutrophils Relative %: 69 %
Platelets: 190 10*3/uL (ref 150–400)
RBC: 4.36 MIL/uL (ref 3.87–5.11)
RDW: 12.5 % (ref 11.5–15.5)
WBC: 8.4 10*3/uL (ref 4.0–10.5)
nRBC: 0 % (ref 0.0–0.2)

## 2022-11-16 LAB — AMMONIA: Ammonia: 17 umol/L (ref 9–35)

## 2022-11-16 LAB — TSH: TSH: 1.294 u[IU]/mL (ref 0.350–4.500)

## 2022-11-16 MED ORDER — CEPHALEXIN 500 MG PO CAPS: 500.0000 mg | ORAL_CAPSULE | Freq: Two times a day (BID) | ORAL | 0 refills | Status: AC

## 2022-11-16 NOTE — ED Provider Notes (Signed)
[place] [facility] Provider Note   CSN: [id number] Arrival date & time: 11/16/22  1153     History  Chief Complaint  Patient presents with   Hallucinations    Cynthia Peters is a 72 y.o. female.  Patient is a 72 year old female with a history of IBS, interstitial cystitis, GERD, anxiety, hypertension who presents with hallucinations.  History is obtained from the patient and the patient's son.  She was brought in because over the last couple days she has had some increase in hallucinations.  She has been seen bugs on the floor.  She 1 point called her pest control guide because she thought there was an animal in her couch and she had a stick stuck in the couch to try to drive away the animal.  She also thought she saw 2 women and a bear out on her porch.  Her son states that she has a strong family history of dementia.  Her mother and sister both had been diagnosed with dementia.  They have noticed that she has had some increased confusion over the last few months and has had some bizarre behavior.  They have noted that sometimes her neighbors have seen her driving around at night.  She has had some mild hallucinations in the past but not as bad.  No history of underlying psychiatric disorder.  No recent illnesses.  No fevers.  No cough or cold symptoms.  No urinary symptoms.       Home Medications Prior to Admission medications   Medication Sig Start Date End Date Taking? Authorizing Provider  cephALEXin (KEFLEX) 500 MG capsule Take 1 capsule (500 mg total) by mouth 2 (two) times daily. 11/16/22  Yes [name], MD  acetaminophen (TYLENOL) 500 MG tablet Take 500-1,000 mg by mouth every 6 (six) hours as needed (for pain).    [provider]  albuterol (VENTOLIN HFA) 108 (90 Base) MCG/ACT inhaler Inhale 2 puffs into the lungs every 6 (six) hours as needed for wheezing or shortness of breath.    [provider]  amLODipine  (NORVASC) 5 MG tablet Take 5 mg by mouth daily. 05/21/20   [provider]  Bioflavonoid Products (VITAMIN C) CHEW Chew 1 tablet by mouth daily.    [provider]  buPROPion (WELLBUTRIN XL) 300 MG 24 hr tablet Take 300 mg by mouth daily.    [provider]  Cholecalciferol (VITAMIN D3 SUPER STRENGTH) 50 MCG (2000 UT) CAPS Take 2,000 Units by mouth daily.    [provider]  clobetasol cream (TEMOVATE) 0.05 % Apply 1 application  topically 2 (two) times daily as needed (for rashes). 04/01/16   [provider]  Coenzyme Q10 (CO Q 10) 100 MG CAPS Take 100 mg by mouth daily.     [provider]  diphenhydrAMINE (BENADRYL) 25 mg capsule Take 1 capsule (25 mg total) by mouth every 6 (six) hours as needed for itching or allergies. Patient taking differently: Take 25 mg by mouth every 6 (six) hours as needed for allergies. 11/07/21   Dorcas Carrow, MD  estradiol (ESTRACE) 0.1 MG/GM vaginal cream See admin instructions. Apply a 0.5 inch ribbon to the affected area 2 times a week as needed/as directed    [provider]  EVENING PRIMROSE OIL PO     [provider]  fexofenadine (ALLEGRA) 180 MG tablet Take 180 mg by mouth daily as needed for allergies or rhinitis.    [provider]  FLORASTOR 250 MG capsule Take 250 mg by mouth See admin instructions. Take 250 mg by mouth once a day WHEN NOT TAKING ALIGN- hold for loose stools    [provider]  fluticasone (FLONASE) 50 MCG/ACT nasal spray Place 1 spray into both nostrils in the morning and at bedtime.    [provider]  hydrochlorothiazide 25 MG tablet Take 25 mg by mouth daily.    [provider]  hydrocortisone cream 1 % Apply 1 application topically 4 (four) times daily as needed for itching.    [provider]  losartan (COZAAR) 100 MG tablet TAKE 1 TABLET BY MOUTH EVERY DAY Patient taking differently: Take 100 mg by mouth daily. 09/14/17    [place], Peter M, MD  magnesium oxide (MAG-OX) 400 MG tablet Take 400 mg by mouth daily.    [provider]  meclizine (ANTIVERT) 25 MG tablet Take 1 tablet (25 mg total) by mouth 3 (three) times daily as needed for dizziness. 05/02/20   Robinson, [place] N, PA-C  metoprolol succinate (TOPROL-XL) 100 MG 24 hr tablet Take 1 tablet (100 mg total) by mouth 2 (two) times daily. Take with or immediately following a meal. Take 100 mg by mouth 2 (two) times daily. Take with or immediately following a meal. Patient taking differently: Take 50-100 mg by mouth See admin instructions. Take 100 mg by mouth in the morning and 50 mg in the evening 07/14/18   [place], Peter M, MD  montelukast (SINGULAIR) 10 MG tablet Take 10 mg by mouth at bedtime.    [provider]  Multiple Vitamins-Minerals (MULTIPLE VITAMINS/WOMENS PO) Take 1 tablet by mouth daily with breakfast.    [provider]  mupirocin ointment (BACTROBAN) 2 % Apply 1 application  topically 3 (three) times daily as needed (for rashes). 06/20/20   [provider]  nystatin powder Apply 1 Application topically 4 (four) times daily as needed (for irritation- affected areas).    [provider]  oxybutynin (DITROPAN) 5 MG tablet Take 5 mg by mouth every 8 (eight) hours as needed. 08/12/22   [provider]  pantoprazole (PROTONIX) 20 MG tablet Take 20 mg by mouth daily. 05/21/20   [provider]  phenazopyridine (PYRIDIUM) 200 MG tablet Take 200 mg by mouth 3 (three) times daily. 09/30/22   [provider]  Polyethyl Glyc-Propyl Glyc PF (SYSTANE ULTRA PF) 0.4-0.3 % SOLN Place 1 drop into both eyes 6 (six) times daily.    [provider]  polyethylene glycol (MIRALAX / GLYCOLAX) 17 g packet Take 17 g by mouth 2 (two) times daily. Patient taking differently: Take 17 g by mouth daily as needed for mild constipation. 11/30/19   Dorcas Carrow, MD  potassium chloride SA (KLOR-CON M20) 20 MEQ  tablet TAKE 1&1/2 TABLETS BY MOUTH DAILY. Patient taking differently: Take 30 mEq by mouth daily. 02/16/20   [place], Peter M, MD  pravastatin (PRAVACHOL) 40 MG tablet Take 40 mg by mouth daily. 05/21/20   [provider]  Probiotic Product (ALIGN) 4 MG CAPS Take 4 mg by mouth See admin instructions. Take 4 mg by mouth once a day WHEN NOT TAKING FLORASTOR- hold for loose stools    [provider]  valACYclovir (VALTREX) 500 MG tablet INCREASE TO TAKING 1 TABLET 2 TIMES A DAY FOR 3 DAYS WITH OUTBREAK Patient taking differently: Take 500 mg by mouth See admin instructions. Take 500 mg by mouth once a day and increase to 500 mg  three times a day when flares occur 03/25/16   Ria Comment, FNP      Allergies    Mushroom extract complex, Other, Hydrocodone-acetaminophen, and Tramadol    Review of Systems   Review of Systems  Constitutional:  Negative for chills, diaphoresis, fatigue and fever.  HENT:  Negative for congestion, rhinorrhea and sneezing.   Eyes: Negative.   Respiratory:  Negative for cough, chest tightness and shortness of breath.   Cardiovascular:  Negative for chest pain and leg swelling.  Gastrointestinal:  Negative for abdominal pain, blood in stool, diarrhea, nausea and vomiting.  Genitourinary:  Negative for difficulty urinating, flank pain, frequency and hematuria.  Musculoskeletal:  Negative for arthralgias and back pain.  Skin:  Negative for rash.  Neurological:  Negative for dizziness, speech difficulty, weakness, numbness and headaches.  Psychiatric/Behavioral:  Positive for hallucinations. The patient is nervous/anxious.     Physical Exam Updated Vital Signs BP (!) 132/58   Pulse 68   Temp 98.1 F (36.7 C) (Oral)   Resp 20   Ht 5\' 4"  (1.626 m)   Wt 71.2 kg   LMP 02/04/2004   SpO2 97%   BMI 26.95 kg/m  Physical Exam Constitutional:      Appearance: She is well-developed.  HENT:     Head: Normocephalic and atraumatic.  Eyes:     Pupils:  Pupils are equal, round, and reactive to light.  Cardiovascular:     Rate and Rhythm: Normal rate and regular rhythm.     Heart sounds: Normal heart sounds.  Pulmonary:     Effort: Pulmonary effort is normal. No respiratory distress.     Breath sounds: Normal breath sounds. No wheezing or rales.  Chest:     Chest wall: No tenderness.  Abdominal:     General: Bowel sounds are normal.     Palpations: Abdomen is soft.     Tenderness: There is no abdominal tenderness. There is no guarding or rebound.  Musculoskeletal:        General: Normal range of motion.     Cervical back: Normal range of motion and neck supple.  Lymphadenopathy:     Cervical: No cervical adenopathy.  Skin:    General: Skin is warm and dry.     Findings: No rash.  Neurological:     General: No focal deficit present.     Mental Status: She is alert and oriented to person, place, and time.     ED Results / Procedures / Treatments   Labs (all labs ordered are listed, but only abnormal results are displayed) Labs Reviewed  COMPREHENSIVE METABOLIC PANEL - Abnormal; Notable for the following components:      Result Value   Glucose, Bld 117 (*)    BUN 31 (*)    Creatinine, Ser 1.12 (*)    Total Bilirubin 1.7 (*)    GFR, Estimated 53 (*)    All other components within normal limits  URINALYSIS, W/ REFLEX TO CULTURE (INFECTION SUSPECTED) - Abnormal; Notable for the following components:   Leukocytes,Ua SMALL (*)    Bacteria, UA FEW (*)    All other components within normal limits  URINE CULTURE  CBC WITH DIFFERENTIAL/PLATELET  AMMONIA  TSH    EKG None  Radiology CT Head Wo Contrast  Result Date: 11/16/2022 CLINICAL DATA:  Mental status change, unknown cause. Hallucinations. EXAM: CT HEAD WITHOUT CONTRAST TECHNIQUE: Contiguous axial images were obtained from the base of the skull through the vertex without intravenous contrast. RADIATION  DOSE REDUCTION: This exam was performed according to the  departmental dose-optimization program which includes automated exposure control, adjustment of the mA and/or kV according to patient size and/or use of iterative reconstruction technique. COMPARISON:  CT head without contrast 05/26/2011 FINDINGS: Brain: Mild atrophy and white matter changes demonstrates some progression over the past 11 years. No acute infarct, hemorrhage, or mass lesion is present. Deep brain nuclei are within normal limits. The ventricles are proportionate to the degree of atrophy. The brainstem and cerebellum are within normal limits. Midline structures are within normal limits. Vascular: No hyperdense vessel or unexpected calcification. Skull: Calvarium is intact. No focal lytic or blastic lesions are present. No significant extracranial soft tissue lesion is present. Sinuses/Orbits: The paranasal sinuses and mastoid air cells are clear. The globes and orbits are within normal limits. IMPRESSION: 1. Mild atrophy and white matter disease demonstrates some progression over the past 11 years. This likely reflects the sequela of chronic microvascular ischemia. 2. No acute intracranial abnormality. Electronically Signed   By: Marin Roberts M.D.   On: 11/16/2022 13:46    Procedures Procedures    Medications Ordered in ED Medications - No data to display  ED Course/ Medical Decision Making/ A&P                                 Medical Decision Making Amount and/or Complexity of Data Reviewed Labs: ordered. Radiology: ordered.   Patient is a 72 year old female who presents with some hallucinations.  In talking with the son, it sounds like that patient has been having some early signs of dementia over the last several months.  She has had some mild confusions/hallucinations in the past with some bizarre behavior but it got worse over the last couple days.  She is afebrile.  She is alert and oriented.  No focal neurologic deficits.  Vital signs are stable.  Head CT shows no acute  abnormality.  Labs reviewed and are nonconcerning.  TSH is pending.  Her urine shows some subtle signs of infection.  Will send for culture.  Will go ahead and start Keflex.  Suspect that this is related to dementia.  Had a discussion with the patient's son regarding this.  Will give her a close outpatient follow-up with neurology.  Discussed with the son that someone should probably stay with the patient as she has been having some worsening bizarre behavior over the last few months.  They are amenable to this.  She was discharged home in good condition.  Will follow-up with her primary care doctor and neurologist.  Return precautions were given.  Final Clinical Impression(s) / ED Diagnoses Final diagnoses:  Hallucinations  Acute cystitis without hematuria    Rx / DC Orders ED Discharge Orders          Ordered    Ambulatory referral to Neurology       Comments: An appointment is requested in approximately: 1 week   11/16/22 1453    cephALEXin (KEFLEX) 500 MG capsule  2 times daily        11/16/22 1457              [name], MD 11/16/22 1459

## 2022-11-16 NOTE — ED Triage Notes (Addendum)
Pt here with son; she reports she has been having visual hallucinations since yesterday (bugs in the house, bears in the yard, people on th deck); denies auditory; no pain; not oriented to date

## 2022-11-16 NOTE — ED Notes (Signed)
Lab notified of urine culture

## 2022-11-18 LAB — URINE CULTURE

## 2022-12-08 ENCOUNTER — Encounter: Payer: Self-pay | Admitting: Neurology

## 2022-12-08 ENCOUNTER — Ambulatory Visit: Payer: Medicare PPO | Admitting: Neurology

## 2022-12-08 VITALS — BP 151/84 | HR 76 | Ht 63.0 in | Wt 152.0 lb

## 2022-12-08 DIAGNOSIS — F03A2 Unspecified dementia, mild, with psychotic disturbance: Secondary | ICD-10-CM | POA: Diagnosis not present

## 2022-12-08 MED ORDER — NAMZARIC 28-10 MG PO CP24: 1.0000 | ORAL_CAPSULE | Freq: Every evening | ORAL | 11 refills | Status: DC

## 2022-12-08 NOTE — Progress Notes (Addendum)
Chief Complaint  Patient presents with   New Patient (Initial Visit)    Rm15, son present, ED referral for dementia, hallucinations: moca was 34      ASSESSMENT AND PLAN  Cynthia Peters is a 72 y.o. female   Dementia with visual hallucinations  Most likely central nervous system degenerative disorder, such as Alzheimer's disease,  CT head showed no acute abnormality, she has claustrophobia, decided to hold off MRI of the brain,  Refer to neuropsychology evaluation  Check B12 to rule out treatable etiology  Namzaric once daily   DIAGNOSTIC DATA (LABS, IMAGING, TESTING) - I reviewed patient records, labs, notes, testing and imaging myself where available. Reviewed neuropsychological evaluation by Dr. Kathreen Cornfield from February 19, 2023, presenting with progressive cognitive behavioral change since 04-06-2022, characterized by pronounced memory impairment, episodic disorientation and visual hallucinations in the context of urinary tract infection, her cognitive profile and behavioral observations suggest a high likelihood of neurodegenerative disease, with prominent memory and executive functioning deficits,  She had pronounced deficit in visual and verbal memory, particularly in delayed recall and recognition, encoding was also exceptionally low, working memory and aspects of executive functioning such as deductive reasoning also showed area of weakness, deficit in working memory, executive functioning and a positive report response bias during memory testing suggest frontally mediated contributions to her cognitive memory impairment, processing speed and phonemic fluency were intact, semantic fluency was below average, emotional functioning revealed moderate depressive symptoms without significant anxiety, this pattern suggest significant cognitive decline from her likely high average baseline, most suggestive of Alzheimer's disease,  However alternative explanation for her memory  difficulties, her cognitive and behavior presentation suggest more frontal lobe mediated contribution to her cognitive decline, hallucinations, hypnagogic hallucinations, nocturnal activities, also raise the possibility of dementia with Lewy body,   MEDICAL HISTORY:  Cynthia Peters, is a 72 year old female seen in request by her primary care doctor [name] for evaluation of memory loss, she is accompanied by her son Cynthia Peters at today's visit on December 08, 2022, also connected with her other son [name] via phone call  History is obtained from the patient and review of electronic medical records. I personally reviewed pertinent available imaging films in PACS.   PMHx of  HTN Anxiety Interstitial cyclitis. Dry mouth syndrome  She is a retired [employer] officer, lives alone, strong family history of dementia, her mother, sister also suffered dementia  She began to have gradual onset of memory loss since 04/06/2021, gradually getting worse, also have intermittent visual hallucination, but severe hallucination leading to emergency room visit November 16, 2022, she called pest control to her house, thinking there are animals in her house, the past control staff noticed she was dealing with hallucinations called her son, leading to ER visit  She describes visual hallucinations, mania the evening time, see people in her backyard, sometimes woke up from dream, could not tell it from reality, she has chronic insomnia,  She denies gait abnormality, no loss sense of smell  [facility] examination is 23/30 today,  CT head without contrast showed generalized atrophy ventriculomegaly no acute abnormality  Laboratory evaluation showed normal TSH, CBC, CMP with elevated creatinine of 1.12  Her sons are worried about her ability to remain independent, in the process of finding a facility moving her to be close to her son in [place],  PHYSICAL EXAM:   Vitals:   12/08/22 0922 12/08/22 0932  BP: (!)  159/76 (!) 151/84  Pulse: 76  Weight: 152 lb (68.9 kg)   Height: 5\' 3"  (1.6 m)    Not recorded     Body mass index is 26.93 kg/m.  PHYSICAL EXAMNIATION:  Gen: NAD, conversant, well nourised, well groomed                     Cardiovascular: Regular rate rhythm, no peripheral edema, warm, nontender. Eyes: Conjunctivae clear without exudates or hemorrhage Neck: Supple, no carotid bruits. Pulmonary: Clear to auscultation bilaterally   NEUROLOGICAL EXAM:  MENTAL STATUS: Speech/cognition: Awake, alert, oriented to history taking and casual conversation    12/08/2022    9:24 AM  Montreal Cognitive Assessment   Visuospatial/ Executive (0/5) 4  Naming (0/3) 3  Attention: Read list of digits (0/2) 2  Attention: Read list of letters (0/1) 1  Attention: Serial 7 subtraction starting at 100 (0/3) 3  Language: Repeat phrase (0/2) 1  Language : Fluency (0/1) 1  Abstraction (0/2) 2  Delayed Recall (0/5) 1  Orientation (0/6) 5  Total 23    CRANIAL NERVES: CN II: Visual fields are full to confrontation. Pupils are round equal and briskly reactive to light. CN III, IV, VI: extraocular movement are normal. No ptosis. CN V: Facial sensation is intact to light touch CN VII: Face is symmetric with normal eye closure  CN VIII: Hearing is normal to causal conversation. CN IX, X: Phonation is normal. CN XI: Head turning and shoulder shrug are intact  MOTOR: There is no pronator drift of out-stretched arms. Muscle bulk and tone are normal. Muscle strength is normal.  REFLEXES: Reflexes are 2+ and symmetric at the biceps, triceps, knees, and ankles. Plantar responses are flexor.  SENSORY: Intact to light touch, pinprick and vibratory sensation are intact in fingers and toes.  COORDINATION: There is no trunk or limb dysmetria noted.  GAIT/STANCE: Posture is normal. Gait is steady   REVIEW OF SYSTEMS:  Full 14 system review of systems performed and notable only for as above All  other review of systems were negative.   ALLERGIES: Allergies  Allergen Reactions   Mushroom Extract Complex Other (See Comments)    Tested allergic   Other Other (See Comments) and Cough    Smoke = Irritates the eyes and causes cold-like symptoms also   Hydrocodone-Acetaminophen Rash   Tramadol Hives and Palpitations    HOME MEDICATIONS: Current Outpatient Medications  Medication Sig Dispense Refill   acetaminophen (TYLENOL) 500 MG tablet Take 500-1,000 mg by mouth every 6 (six) hours as needed (for pain).     albuterol (VENTOLIN HFA) 108 (90 Base) MCG/ACT inhaler Inhale 2 puffs into the lungs every 6 (six) hours as needed for wheezing or shortness of breath.     amLODipine (NORVASC) 5 MG tablet Take 5 mg by mouth daily.     Bioflavonoid Products (VITAMIN C) CHEW Chew 1 tablet by mouth daily.     buPROPion (WELLBUTRIN XL) 300 MG 24 hr tablet Take 300 mg by mouth daily.     cephALEXin (KEFLEX) 500 MG capsule Take 1 capsule (500 mg total) by mouth 2 (two) times daily. 14 capsule 0   clobetasol cream (TEMOVATE) 0.05 % Apply 1 application  topically 2 (two) times daily as needed (for rashes).  7   Coenzyme Q10 (CO Q 10) 100 MG CAPS Take 100 mg by mouth daily.      diphenhydrAMINE (BENADRYL) 25 mg capsule Take 1 capsule (25 mg total) by mouth every 6 (six) hours as needed for  itching or allergies. (Patient taking differently: Take 25 mg by mouth every 6 (six) hours as needed for allergies.) 30 capsule 0   estradiol (ESTRACE) 0.1 MG/GM vaginal cream See admin instructions. Apply a 0.5 inch ribbon to the affected area 2 times a week as needed/as directed     EVENING PRIMROSE OIL PO      fexofenadine (ALLEGRA) 180 MG tablet Take 180 mg by mouth daily as needed for allergies or rhinitis.     FLORASTOR 250 MG capsule Take 250 mg by mouth See admin instructions. Take 250 mg by mouth once a day WHEN NOT TAKING ALIGN- hold for loose stools     fluticasone (FLONASE) 50 MCG/ACT nasal spray Place 1  spray into both nostrils in the morning and at bedtime.     hydrochlorothiazide 25 MG tablet Take 25 mg by mouth daily.     hydrocortisone cream 1 % Apply 1 application topically 4 (four) times daily as needed for itching.     losartan (COZAAR) 100 MG tablet TAKE 1 TABLET BY MOUTH EVERY DAY (Patient taking differently: Take 100 mg by mouth daily.) 90 tablet 3   magnesium oxide (MAG-OX) 400 MG tablet Take 400 mg by mouth daily.     meclizine (ANTIVERT) 25 MG tablet Take 1 tablet (25 mg total) by mouth 3 (three) times daily as needed for dizziness. 30 tablet 0   metoprolol succinate (TOPROL-XL) 100 MG 24 hr tablet Take 1 tablet (100 mg total) by mouth 2 (two) times daily. Take with or immediately following a meal. Take 100 mg by mouth 2 (two) times daily. Take with or immediately following a meal. (Patient taking differently: Take 50-100 mg by mouth See admin instructions. Take 100 mg by mouth in the morning and 50 mg in the evening) 30 tablet 1   montelukast (SINGULAIR) 10 MG tablet Take 10 mg by mouth at bedtime.     Multiple Vitamins-Minerals (MULTIPLE VITAMINS/WOMENS PO) Take 1 tablet by mouth daily with breakfast.     mupirocin ointment (BACTROBAN) 2 % Apply 1 application  topically 3 (three) times daily as needed (for rashes).     nystatin powder Apply 1 Application topically 4 (four) times daily as needed (for irritation- affected areas).     oxybutynin (DITROPAN) 5 MG tablet Take 5 mg by mouth every 8 (eight) hours as needed.     pantoprazole (PROTONIX) 20 MG tablet Take 20 mg by mouth daily.     phenazopyridine (PYRIDIUM) 200 MG tablet Take 200 mg by mouth 3 (three) times daily.     Polyethyl Glyc-Propyl Glyc PF (SYSTANE ULTRA PF) 0.4-0.3 % SOLN Place 1 drop into both eyes 6 (six) times daily.     polyethylene glycol (MIRALAX / GLYCOLAX) 17 g packet Take 17 g by mouth 2 (two) times daily. (Patient taking differently: Take 17 g by mouth daily as needed for mild constipation.) 14 each 0    potassium chloride SA (KLOR-CON M20) 20 MEQ tablet TAKE 1&1/2 TABLETS BY MOUTH DAILY. (Patient taking differently: Take 30 mEq by mouth daily.) 135 tablet 3   pravastatin (PRAVACHOL) 40 MG tablet Take 40 mg by mouth daily.     Probiotic Product (ALIGN) 4 MG CAPS Take 4 mg by mouth See admin instructions. Take 4 mg by mouth once a day WHEN NOT TAKING FLORASTOR- hold for loose stools     valACYclovir (VALTREX) 500 MG tablet INCREASE TO TAKING 1 TABLET 2 TIMES A DAY FOR 3 DAYS WITH OUTBREAK (Patient taking  differently: Take 500 mg by mouth See admin instructions. Take 500 mg by mouth once a day and increase to 500 mg three times a day when flares occur) 30 tablet 0   No current facility-administered medications for this visit.    PAST MEDICAL HISTORY: Past Medical History:  Diagnosis Date   Anemia    history in the past, not on a regular basis   Arthritis    Bronchitis    Cataracts, bilateral    immature   Claustrophobia    takes Valium daily as needed   Complication of anesthesia    2013 SURGERY vocal cord bothering pt, used smaller ent tube after 2 hernia repair, no problem after . " I woke up during my MRI and colonoscopy."        Depression    takes Wellbutrin daily   Dry eye    Dry mouth    GERD (gastroesophageal reflux disease)    takes Omeprazole daily    H/O hiatal hernia    Headache    migraines with flashing lights    History of blood clots 40 yrs    leg    History of echocardiogram 2013   a. Echo (05/2011):  Mild LVH, EF 65-70%, no WMA, Gr 1 DD, mild AI.   History of MRSA infection    History of staph infection    History of stress test 2010   a. ETT-Echo in 2010 was normal   History of TIA (transient ischemic attack) 2013   a. Carotid US (05/2011):  No ICA stenosis, pt unsure of this   Hyperlipidemia    takes Simvastatin daily    Hypertension    takes  Losartan daily   IBS (irritable bowel syndrome)    takes Bentyl daily as needed   Insomnia    takes Ambien  nightly   Interstitial cystitis    Lichen planus    Mild asthma    Albuterol inhaler as needed   Palpitations    Event monitor in 2007 with rare PVCs // takes Metoprolol daily   Partial small bowel obstruction (HCC) 07/16/2017   PVC (premature ventricular contraction)    Right radial head fracture 06/17/2016   Seasonal allergies    takes Claritin daily as needed.Takes Singulair daily as needed.   TIA (transient ischemic attack) 05/27/2011   Urethral stricture    Vertigo    takes Meclizine daily as needed    PAST SURGICAL HISTORY: Past Surgical History:  Procedure Laterality Date   ARTHROTOMY Right 09/21/2020   Procedure: Right elbow open arthrotomy and synovectomy with radial head implant removal and repair reconstruction as necessary including ligamentous repair;  Surgeon: Dominica Severin, MD;  Location: [facility];  Service: Orthopedics;  Laterality: Right;    CESAREAN SECTION     COLONOSCOPY WITH PROPOFOL N/A 08/10/2012   Procedure: COLONOSCOPY WITH PROPOFOL;  Surgeon: Charolett Bumpers, MD;  Location: [facility];  Service: Endoscopy;  Laterality: N/A;   CYSTOSCOPY WITH URETHRAL DILATATION N/A 02/18/2013   Procedure: CYSTOSCOPY WITH URETHRAL DILATATION ( NO BALLOON) AND HYDRODISTENSION;  Surgeon: Sebastian Ache, MD;  Location: [facility];  Service: Urology;  Laterality: N/A;   ELBOW SURGERY     ENDOMETRIAL ABLATION W/ HYDROTHERMABLATOR  09/27/2002   HERNIA REPAIR     INCISIONAL HERNIA REPAIR N/A 10/25/2014   Procedure: HERNIA REPAIR INCISIONAL;  Surgeon: Axel Filler, MD;  Location[facility] OR;  Service: General;  Laterality: N/A;   INCISIONAL HERNIA REPAIR N/A 03/26/2015  Procedure: LAPAROSCOPIC INCISIONAL HERNIA;  Surgeon: Axel Filler, MD;  Location[facility] OR;  Service: General;  Laterality: N/A;   INSERTION OF MESH N/A 10/25/2014   Procedure: INSERTION OF MESH;  Surgeon: Axel Filler, MD;  Location: [facility] OR;  Service: General;  Laterality: N/A;    LAPAROSCOPIC LYSIS OF ADHESIONS N/A 03/26/2015   Procedure: LAPAROSCOPIC LYSIS OF ADHESIONS;  Surgeon: Axel Filler, MD;  Location: [facility] OR;  Service: General;  Laterality: N/A;   LAPAROTOMY N/A 10/25/2014   Procedure: EXPLORATORY LAPAROTOMY;  Surgeon: Axel Filler, MD;  Location: [facility] OR;  Service: General;  Laterality: N/A;   LYSIS OF ADHESION N/A 10/25/2014   Procedure: LYSIS OF ADHESIONS ;  Surgeon: Axel Filler, MD;  Location: [facility] OR;  Service: General;  Laterality: N/A;   MUCOSAL ADVANCEMENT FLAP N/A 10/25/2014   Procedure: MUCOSAL ADVANCEMENT FLAP;  Surgeon: Axel Filler, MD;  Location: [facility] OR;  Service: General;  Laterality: N/A;   ORIF RADIAL FRACTURE Right 06/17/2016   Procedure: right radial head arthroplasty with ligament repair, reconstruciton;  Surgeon: Dominica Severin, MD;  Location: [facility] OR;  Service: Orthopedics;  Laterality: Right;   RADIAL HEAD ARTHROPLASTY Right 06/17/2016   TOOTH EXTRACTION     TRANSTHORACIC ECHOCARDIOGRAM  05/26/2011   MILD LVH/  EF 65-70%/  GRADE I DIASTOLIC DYSFUNCTION/  MILD AR//   06-20-2008  NOMRAL STRESS ECHO   UMBILICAL HERNIA REPAIR  2013   AND REVISION THE SAME YEAR W/ MESH   WISDOM TOOTH EXTRACTION  AGE 28    FAMILY HISTORY: Family History  Problem Relation Age of Onset   Dementia Mother    Cirrhosis Father    Dementia Sister    Leukemia Child     SOCIAL HISTORY: Social History   Socioeconomic History   Marital status: Divorced    Spouse name: Not on file   Number of children: 2   Years of education: Not on file   Highest education level: Not on file  Occupational History   Occupation: Runner, broadcasting/film/video  Tobacco Use   Smoking status: Former    Current packs/day: 0.00    Average packs/day: 0.3 packs/day for 2.0 years (0.5 ttl pk-yrs)    Types: Cigarettes    Start date: 02/03/1974    Quit date: 02/04/1976    Years since quitting: 46.8    Passive exposure: Past   Smokeless tobacco: Never  Vaping Use   Vaping status: Never Used   Substance and Sexual Activity   Alcohol use: Yes    Comment: occasional   Drug use: No   Sexual activity: Not on file  Other Topics Concern   Not on file  Social History Narrative   Not on file   Social Determinants of Health   Financial Resource Strain: Low Risk  (11/11/2021)   [facility] prior to [facility]/05/2022[facility] visits prior to 04/05/2022.   Overall Financial Resource Strain (CARDIA)    Difficulty of Paying Living Expenses: Not very hard  Food Insecurity: Low Risk  (07/16/2022)   Received from [facility]   Hunger Vital Sign    Worried About Running Out of Food in the Last Year: Never true    Ran Out of Food in the Last Year: Never true  Transportation Needs: Not on file (07/16/2022)  Physical Activity: Insufficiently Active (11/11/2021)   [facility], [facility] prior to 04/05/2022., [facility] visits prior to 04/05/2022.   Exercise Vital Sign    Days of Exercise per Week: 2 days    Minutes of Exercise per Session: 20 min  Stress: No Stress Concern Present (11/11/2021)   [facility], [facility] prior to 04/05/2022., [facility] visits prior to 04/05/2022.   [employer] of Occupational Health - Occupational Stress Questionnaire    Feeling of Stress : Only a little  Social Connections: Moderately Isolated (11/11/2021)   [facility], [facility] visits prior to 04/05/2022., [facility] visits prior to 04/05/2022.   Social Advertising account executive [NHANES]    Frequency of Communication with Friends and Family: More than three times a week    Frequency of Social Gatherings with Friends and Family: Once a week    Attends Religious Services: 1 to 4  times per year    Active Member of [employer] or Organizations: No    Attends Banker Meetings: Patient declined    Marital Status: Divorced  Catering manager Violence: Not At Risk (07/01/2022)   Humiliation, Afraid, Rape, and Kick questionnaire    Fear of Current or Ex-Partner: No    Emotionally Abused: No    Physically Abused: No    Sexually Abused: No      Levert Feinstein, M.D. Ph.D.  [facility] [address] [place], [place] [address] Ph: [phone] Fax: [phone]  CC:  [name], MD [address] [place],  [place] [address]  Richmond Campbell., PA-C

## 2022-12-09 LAB — VITAMIN B12: Vitamin B-12: 754 pg/mL (ref 232–1245)

## 2022-12-10 ENCOUNTER — Telehealth: Payer: Self-pay | Admitting: Neurology

## 2022-12-10 NOTE — Telephone Encounter (Signed)
Neuropsych referral faxed to [facility] (fax#[phone], phone# [phone])

## 2022-12-16 NOTE — Telephone Encounter (Signed)
[facility] Marylene Land) have attempted to contact patient 3 times to schedule appt.  Discuss with Marylene Land I will try to contact the patient.  Contacted Ms Marceau, she said she would call [facility] to schedule appt.

## 2022-12-17 ENCOUNTER — Encounter: Payer: Self-pay | Admitting: Neurology

## 2022-12-17 DIAGNOSIS — F03A2 Unspecified dementia, mild, with psychotic disturbance: Secondary | ICD-10-CM

## 2022-12-22 MED ORDER — MEMANTINE HCL 5 MG PO TABS: 5.0000 mg | ORAL_TABLET | Freq: Two times a day (BID) | ORAL | 11 refills | Status: DC

## 2022-12-22 NOTE — Addendum Note (Signed)
Addended by: Levert Feinstein on: 12/22/2022 02:16 PM   Modules accepted: Orders

## 2023-01-13 ENCOUNTER — Other Ambulatory Visit: Payer: Self-pay | Admitting: Neurology

## 2023-01-26 ENCOUNTER — Telehealth: Payer: Self-pay | Admitting: Neurology

## 2023-01-26 MED ORDER — NAMZARIC 28-10 MG PO CP24: 1.0000 | ORAL_CAPSULE | Freq: Every evening | ORAL | 11 refills | Status: AC

## 2023-01-26 NOTE — Telephone Encounter (Signed)
Called and spoke to pt and stated that since Dr. Terrace Arabia isn't in the office we will need to keep at same dose until she returns. Pt voiced gratitude and understanding

## 2023-01-26 NOTE — Telephone Encounter (Signed)
Pt has called to report that she is down to about 2 days worth of the memantine (NAMENDA) 5 MG tablet .  Pt states she understood that Dr Terrace Arabia would gradually increase her dose of this medication.  Pt asking if Dr Terrace Arabia will be calling in a different strength or if will be a refill of what she has been taking that is needing to be called in.

## 2023-02-19 NOTE — Telephone Encounter (Signed)
Received neuropsych report from [facility], Placed in POD 2 to review
